# Patient Record
Sex: Female | Born: 1945
Health system: Southern US, Community
[De-identification: ages and names within clinical notes are randomized; demographics above are authoritative.]

## PROBLEM LIST (undated history)

## (undated) DIAGNOSIS — C801 Malignant (primary) neoplasm, unspecified: Secondary | ICD-10-CM

## (undated) DIAGNOSIS — I10 Essential (primary) hypertension: Secondary | ICD-10-CM

## (undated) DIAGNOSIS — R7303 Prediabetes: Secondary | ICD-10-CM

## (undated) DIAGNOSIS — M5136 Other intervertebral disc degeneration, lumbar region: Secondary | ICD-10-CM

## (undated) DIAGNOSIS — R011 Cardiac murmur, unspecified: Secondary | ICD-10-CM

## (undated) DIAGNOSIS — M1990 Unspecified osteoarthritis, unspecified site: Secondary | ICD-10-CM

## (undated) DIAGNOSIS — D649 Anemia, unspecified: Secondary | ICD-10-CM

## (undated) DIAGNOSIS — E785 Hyperlipidemia, unspecified: Secondary | ICD-10-CM

## (undated) DIAGNOSIS — M5126 Other intervertebral disc displacement, lumbar region: Secondary | ICD-10-CM

## (undated) DIAGNOSIS — M5137 Other intervertebral disc degeneration, lumbosacral region: Secondary | ICD-10-CM

## (undated) DIAGNOSIS — K5190 Ulcerative colitis, unspecified, without complications: Secondary | ICD-10-CM

## (undated) DIAGNOSIS — M51369 Other intervertebral disc degeneration, lumbar region without mention of lumbar back pain or lower extremity pain: Secondary | ICD-10-CM

## (undated) DIAGNOSIS — K219 Gastro-esophageal reflux disease without esophagitis: Secondary | ICD-10-CM

## (undated) HISTORY — PX: EYE SURGERY: SHX253

---

## 1997-07-09 ENCOUNTER — Other Ambulatory Visit: Admission: RE | Admit: 1997-07-09 | Discharge: 1997-07-09 | Payer: Self-pay | Admitting: Obstetrics and Gynecology

## 1998-01-31 ENCOUNTER — Ambulatory Visit (HOSPITAL_COMMUNITY): Admission: RE | Admit: 1998-01-31 | Discharge: 1998-01-31 | Payer: Self-pay | Admitting: Gastroenterology

## 1998-09-01 ENCOUNTER — Other Ambulatory Visit: Admission: RE | Admit: 1998-09-01 | Discharge: 1998-09-01 | Payer: Self-pay | Admitting: Obstetrics and Gynecology

## 1998-12-13 ENCOUNTER — Encounter: Payer: Self-pay | Admitting: Obstetrics and Gynecology

## 1998-12-13 ENCOUNTER — Encounter: Admission: RE | Admit: 1998-12-13 | Discharge: 1998-12-13 | Payer: Self-pay | Admitting: Obstetrics and Gynecology

## 1999-06-23 ENCOUNTER — Encounter (INDEPENDENT_AMBULATORY_CARE_PROVIDER_SITE_OTHER): Payer: Self-pay | Admitting: Specialist

## 1999-06-23 ENCOUNTER — Ambulatory Visit (HOSPITAL_COMMUNITY): Admission: RE | Admit: 1999-06-23 | Discharge: 1999-06-23 | Payer: Self-pay | Admitting: Gastroenterology

## 1999-11-06 ENCOUNTER — Other Ambulatory Visit: Admission: RE | Admit: 1999-11-06 | Discharge: 1999-11-06 | Payer: Self-pay | Admitting: Obstetrics and Gynecology

## 1999-12-07 ENCOUNTER — Other Ambulatory Visit: Admission: RE | Admit: 1999-12-07 | Discharge: 1999-12-07 | Payer: Self-pay | Admitting: Gastroenterology

## 1999-12-20 ENCOUNTER — Encounter: Admission: RE | Admit: 1999-12-20 | Discharge: 1999-12-20 | Payer: Self-pay | Admitting: Obstetrics and Gynecology

## 1999-12-20 ENCOUNTER — Encounter: Payer: Self-pay | Admitting: Obstetrics and Gynecology

## 1999-12-22 ENCOUNTER — Encounter (INDEPENDENT_AMBULATORY_CARE_PROVIDER_SITE_OTHER): Payer: Self-pay | Admitting: *Deleted

## 1999-12-22 ENCOUNTER — Ambulatory Visit (HOSPITAL_COMMUNITY): Admission: RE | Admit: 1999-12-22 | Discharge: 1999-12-22 | Payer: Self-pay | Admitting: Gastroenterology

## 2000-06-04 ENCOUNTER — Other Ambulatory Visit: Admission: RE | Admit: 2000-06-04 | Discharge: 2000-06-04 | Payer: Self-pay | Admitting: Gastroenterology

## 2000-12-03 ENCOUNTER — Other Ambulatory Visit: Admission: RE | Admit: 2000-12-03 | Discharge: 2000-12-03 | Payer: Self-pay | Admitting: Obstetrics and Gynecology

## 2000-12-24 ENCOUNTER — Encounter: Admission: RE | Admit: 2000-12-24 | Discharge: 2000-12-24 | Payer: Self-pay | Admitting: Obstetrics and Gynecology

## 2000-12-24 ENCOUNTER — Encounter: Payer: Self-pay | Admitting: Obstetrics and Gynecology

## 2001-12-26 ENCOUNTER — Encounter: Admission: RE | Admit: 2001-12-26 | Discharge: 2001-12-26 | Payer: Self-pay | Admitting: Obstetrics and Gynecology

## 2001-12-26 ENCOUNTER — Encounter: Payer: Self-pay | Admitting: Obstetrics and Gynecology

## 2002-08-25 ENCOUNTER — Ambulatory Visit (HOSPITAL_COMMUNITY): Admission: RE | Admit: 2002-08-25 | Discharge: 2002-08-25 | Payer: Self-pay | Admitting: Gastroenterology

## 2002-08-25 ENCOUNTER — Encounter (INDEPENDENT_AMBULATORY_CARE_PROVIDER_SITE_OTHER): Payer: Self-pay | Admitting: *Deleted

## 2002-11-27 ENCOUNTER — Encounter: Admission: RE | Admit: 2002-11-27 | Discharge: 2002-11-27 | Payer: Self-pay | Admitting: Gastroenterology

## 2002-12-28 ENCOUNTER — Encounter: Admission: RE | Admit: 2002-12-28 | Discharge: 2002-12-28 | Payer: Self-pay | Admitting: Obstetrics and Gynecology

## 2003-08-02 ENCOUNTER — Ambulatory Visit (HOSPITAL_COMMUNITY): Admission: RE | Admit: 2003-08-02 | Discharge: 2003-08-02 | Payer: Self-pay | Admitting: Gastroenterology

## 2003-08-02 ENCOUNTER — Encounter (INDEPENDENT_AMBULATORY_CARE_PROVIDER_SITE_OTHER): Payer: Self-pay | Admitting: *Deleted

## 2003-12-30 ENCOUNTER — Encounter: Admission: RE | Admit: 2003-12-30 | Discharge: 2003-12-30 | Payer: Self-pay | Admitting: Obstetrics and Gynecology

## 2005-01-16 ENCOUNTER — Encounter: Admission: RE | Admit: 2005-01-16 | Discharge: 2005-01-16 | Payer: Self-pay | Admitting: Obstetrics and Gynecology

## 2006-01-17 ENCOUNTER — Encounter: Admission: RE | Admit: 2006-01-17 | Discharge: 2006-01-17 | Payer: Self-pay | Admitting: Obstetrics and Gynecology

## 2007-01-20 ENCOUNTER — Encounter: Admission: RE | Admit: 2007-01-20 | Discharge: 2007-01-20 | Payer: Self-pay | Admitting: Obstetrics and Gynecology

## 2007-07-22 ENCOUNTER — Encounter (INDEPENDENT_AMBULATORY_CARE_PROVIDER_SITE_OTHER): Payer: Self-pay | Admitting: Gastroenterology

## 2007-07-22 ENCOUNTER — Ambulatory Visit (HOSPITAL_COMMUNITY): Admission: RE | Admit: 2007-07-22 | Discharge: 2007-07-22 | Payer: Self-pay | Admitting: Gastroenterology

## 2008-01-21 ENCOUNTER — Encounter: Admission: RE | Admit: 2008-01-21 | Discharge: 2008-01-21 | Payer: Self-pay | Admitting: Nurse Practitioner

## 2008-04-23 ENCOUNTER — Encounter: Admission: RE | Admit: 2008-04-23 | Discharge: 2008-04-23 | Payer: Self-pay | Admitting: Internal Medicine

## 2008-12-20 ENCOUNTER — Ambulatory Visit (HOSPITAL_COMMUNITY): Admission: RE | Admit: 2008-12-20 | Discharge: 2008-12-20 | Payer: Self-pay | Admitting: Gastroenterology

## 2008-12-23 ENCOUNTER — Encounter: Admission: RE | Admit: 2008-12-23 | Discharge: 2008-12-23 | Payer: Self-pay | Admitting: Gastroenterology

## 2009-01-21 ENCOUNTER — Encounter: Admission: RE | Admit: 2009-01-21 | Discharge: 2009-01-21 | Payer: Self-pay | Admitting: Internal Medicine

## 2009-06-22 ENCOUNTER — Encounter: Admission: RE | Admit: 2009-06-22 | Discharge: 2009-06-22 | Payer: Self-pay | Admitting: Internal Medicine

## 2009-09-26 ENCOUNTER — Emergency Department (HOSPITAL_COMMUNITY): Admission: EM | Admit: 2009-09-26 | Discharge: 2009-09-26 | Payer: Self-pay | Admitting: Emergency Medicine

## 2009-10-07 ENCOUNTER — Encounter: Admission: RE | Admit: 2009-10-07 | Discharge: 2009-10-07 | Payer: Self-pay | Admitting: Internal Medicine

## 2010-01-27 ENCOUNTER — Other Ambulatory Visit: Payer: Self-pay | Admitting: Gastroenterology

## 2010-02-13 ENCOUNTER — Other Ambulatory Visit: Payer: Self-pay | Admitting: Internal Medicine

## 2010-02-13 DIAGNOSIS — Z1231 Encounter for screening mammogram for malignant neoplasm of breast: Secondary | ICD-10-CM

## 2010-02-21 ENCOUNTER — Ambulatory Visit
Admission: RE | Admit: 2010-02-21 | Discharge: 2010-02-21 | Disposition: A | Discharge status: Home / Self Care | Payer: Self-pay | Source: Ambulatory Visit | Attending: Internal Medicine | Admitting: Internal Medicine

## 2010-02-21 DIAGNOSIS — Z1231 Encounter for screening mammogram for malignant neoplasm of breast: Secondary | ICD-10-CM

## 2010-03-16 LAB — URINALYSIS, ROUTINE W REFLEX MICROSCOPIC
Bilirubin Urine: NEGATIVE
Glucose, UA: NEGATIVE mg/dL
Ketones, ur: NEGATIVE mg/dL
Nitrite: NEGATIVE
Protein, ur: 30 mg/dL — AB
Specific Gravity, Urine: 1.011 (ref 1.005–1.030)
Urobilinogen, UA: 0.2 mg/dL (ref 0.0–1.0)
pH: 7.5 (ref 5.0–8.0)

## 2010-03-16 LAB — URINE MICROSCOPIC-ADD ON

## 2010-05-16 NOTE — Op Note (Signed)
Donna [name], Donna [name]               ACCOUNT NO.:  [id number]   MEDICAL RECORD NO.:  [id number]          PATIENT TYPE:  AMB   LOCATION:  ENDO                         FACILITY:  [facility]   PHYSICIAN:  Ronald Lobo, M.D.   DATE OF BIRTH:  1945/01/29   DATE OF PROCEDURE:  07/22/2007  DATE OF DISCHARGE:                               OPERATIVE REPORT   PROCEDURE:  Upper endoscopy with Savary dilatation of the esophagus  under fluoroscopy.   INDICATIONS:  A 64 year old female with dysphagia symptoms and known  history of esophageal ring seen on previous endoscopy, but never  previously dilated.   FINDINGS:  Dilatation to 18 mm performed.   DESCRIPTION OF PROCEDURE:  The risks of the procedure had been reviewed  with the patient who provided written consent.  Sedation was fentanyl  100 mcg and Versed 10 mg IV.  The Pentax video endoscope was passed  under direct vision, entering the esophagus with slight difficulty  through a somewhat hypertonic upper esophageal sphincter.   The esophagus was endoscopically normal, specifically without evidence  of reflux esophagitis, Barrett's esophagus, varices, infection or  neoplasia.  A small 2 cm hiatal hernia was present and an intermittently-  visible mild esophageal ring was present, which offered no resistance to  passage of the scope into the stomach.  The stomach contained no  significant residual and had normal mucosa, including a retroflexed view  of the cardia which showed the hiatal hernia from its inferior  perspective, with a minimally patulous diaphragmatic hiatus.  No  gastritis, erosions, ulcers, polyps or masses were seen in the stomach.   The pylorus was normal.  The distal duodenal bulb, at its junction with  the second duodenum, had some focal erosions, but no ulcers were seen  and the second duodenum looked normal.   Savary dilatation was then performed in the standard fashion.  The  spring tipped guidewire was passed through  the scope into stomach.  The  scope was removed in an exchange fashion.  The wire was confirmed to be  in appropriate position fluoroscopically, and a single passage was made  with an 18 mm Savary dilator, meeting some resistance in the  cricopharyngeal region and then passing easily such that the collar was  noted to have passed below the level of the diaphragm.  The dilator and  the guidewire were removed, and the patient was free endoscoped under  direct vision which showed no evident fracture of the ring, nor any  undue trauma to the cricopharyngeal region, esophagus or stomach.   The scope was then removed from the patient who tolerated the procedure  well and without apparent complication.   IMPRESSION:  1. Dysphagia symptoms, presumably secondary to esophageal ring,      dilated as described above to 18 mm.  2. Small hiatal hernia.  3. Mild erosive duodenitis.   PLAN:  Clinical follow-up of dysphagia symptoms.           ______________________________  Ronald Lobo, M.D.     RB/MEDQ  D:  07/22/2007  T:  07/22/2007  Job:  [id number]

## 2010-05-16 NOTE — Op Note (Signed)
NAMEFREDDI, FORSTER               ACCOUNT NO.:  [id number]   MEDICAL RECORD NO.:  [id number]          PATIENT TYPE:  AMB   LOCATION:  ENDO                         FACILITY:  [facility]   PHYSICIAN:  Ronald Lobo, M.D.   DATE OF BIRTH:  1945-08-06   DATE OF PROCEDURE:  07/22/2007  DATE OF DISCHARGE:                               OPERATIVE REPORT   PROCEDURE:  Colonoscopy with biopsy.   INDICATIONS:  A 65 year old female with longstanding ulcerative colitis,  for dysplasia surveillance.   FINDINGS:  Mild disease activity throughout the colon.   DESCRIPTION OF PROCEDURE:  The nature, purpose and risks of the  procedure were familiar to the patient from prior examinations and she  provided written consent.  Sedation was fentanyl 125 mcg and Versed 12.5  mg IV prior to and during this procedure and the upper endoscopy with  esophageal dilatation that preceded it.   There was no problem with clinical instability during the procedure.   The Pentax adult video colonoscope was advanced to the terminal ileum  without significant difficulty.  The terminal ileum had a normal  appearance.  Pullback was then performed.  The quality of the prep was  quite good, and it is felt that all areas were adequately seen.   Throughout the colon there was erythema and, especially more proximally,  a moderate amount of granularity and a little bit of exudate.  No  friability, hemorrhage, ulcerations, polyps or masses were seen, and I  did not see any diverticulosis or vascular ectasia.   Random biopsies were obtained along the length of the colon.   The patient tolerated the procedure well and there no apparent  complications.  Retroflexion was not performed in the rectum.   IMPRESSION:  Mild to moderate ulcerative colitis activity throughout the  colon.   PLAN:  Await pathology results.           ______________________________  Ronald Lobo, M.D.     RB/MEDQ  D:  07/22/2007  T:  07/22/2007   Job:  [id number]

## 2010-05-19 NOTE — Op Note (Signed)
   NAME:  Donna [name], Donna [name]                         ACCOUNT NO.:  [id number]   MEDICAL RECORD NO.:  [id number]                   PATIENT TYPE:  AMB   LOCATION:  ENDO                                 FACILITY:  [place]   PHYSICIAN:  Ronald Lobo, M.D.                DATE OF BIRTH:  [birth year]   DATE OF PROCEDURE:  08/25/2002  DATE OF DISCHARGE:                                 OPERATIVE REPORT   PROCEDURE PERFORMED:  Upper endoscopy.   ENDOSCOPIST:  Cleotis Nipper, M.D.   INDICATIONS FOR PROCEDURE:  Reflux symptoms fairly well controlled by proton  pump inhibitor therapy.   FINDINGS:  Small hiatal hernia with esophageal ring.  No adverse sequelae of  gastroesophageal reflux disease endoscopically evident.   DESCRIPTION OF PROCEDURE:  The patient provided written consent.  Sedation  was fentanyl 100 mcg and Versed 8 mg IV without arrhythmias or desaturation.  The Olympus video endoscope was passed under direct vision.  The vocal cords  and larynx looked grossly normal, perhaps a little bit of thickening of the  posterior commissure.   The esophagus was readily entered and normal in its entirety without  evidence of reflux esophagitis, Barrett's esophagus, varices, infection or  neoplasia or free reflux.  There was a small hiatal hernia within the  esophageal ring.  The ring was widely patent.  The stomach contained no  significant residual had normal mucosa without evidence of gastritis,  erosions, ulcers, polyps or masses.  The pylorus, duodenal bulb and second  duodenum looked normal.  Retroflex viewing was unremarkable by my  recollection.  No biopsies were obtained.  The patient tolerated the  procedure well and there were no apparent complications.   IMPRESSION:  Essentially normal endoscopy.  Small hiatal hernia with  esophageal ring but no adverse sequelae of reflux identified.   PLAN:  Continue proton pump inhibitor therapy as needed.  Proceed to  colonoscopic  evaluation.                                                Ronald Lobo, M.D.    RB/MEDQ  D:  08/25/2002  T:  08/25/2002  Job:  [id number]   cc:   [place]

## 2010-05-19 NOTE — Op Note (Signed)
[place]  Patient:    Donna [name], Donna [name]                      MRN: [id number] Proc. Date: 12/22/99 Adm. Date:  [id number] Disc. Date: [id number] Attending:  Ernie Avena                           Operative Report  PROCEDURE:  Colonoscopy with polypectomy and biopsies.  ENDOSCOPIST:  Cleotis Nipper, M.D.  INDICATIONS:  A 65 year old female with longstanding ulcerative colitis for dysplasia surveillance, and removal of residual rectal polyp tissue identified on recent flexible sigmoidoscopy and noted to be adenomatous in character.  FINDINGS:  Left-sided colitis, sessile 2 cm diameter flap verrucous polyp about 3 cm inside the anal verge.  INFORMED CONSENT:  The nature, purpose, and risks of the procedure are familiar to the patient from multiple previous exams and she provided written consent.  SEDATION:  Fentanyl 100 mcg and Versed 10 mg IV without arrhythmias or desaturation.  DESCRIPTION OF PROCEDURE:  The Olympus Adult video colonoscope was easily advanced to the cecum and for a short distance into a normal appearing terminal ileum after which pullback was initiated.  The quality of the prep was excellent and it was felt that all areas were well seen.  There was left-sided confluent colitis characterized by "sandpaper" erythema, and slight granularity up to about 60 cm, felt to probable correspond to the distal transverse colon.  Proximal to that, the mucosa looked quite normal except for some tiny foci of erythema here and there, but the vascular pattern was fairly well preserved.  In the rectum, a few centimeters inside the anal verge was a 2 cm diameter very flat sessile verrucous polyp as noted previously and for which previous attempts at removal with snare polypectomy had been made.  However, the polyp had not been completely excised as evidenced by recent sigmoidoscopic evaluation.  The lesion was raised by  injecting normal saline submucosaly underneath the lesion to elevate it, and then it was snared off piecemeal, dragging the biopsy forceps cross lesion while cauterizing to help desiccate and devitalize the tissue.  Multiple tiny fragments of tissue were obtained for histologic analysis, of this lesion, and when I was done, it appeared that all tissue had either been excised or devitalized.  Random mucosal biopsies were obtained from the uninflamed and inflamed segments of the colon as well to look for dysplasia.  The patient tolerated the procedure well and there were no apparent complications.  No other polyps other than the one mentioned above were identified, nor there was any evidence of vascular malformations or diverticulosis.  PLAN:  Await pathology.  Probable flexible sigmoidoscopy in 6-12 months and follow up colonoscopy in 1-2 years, depending on the histologic findings. DD:  12/22/99 TD:  12/24/99 Job: [id number]

## 2010-05-19 NOTE — Op Note (Signed)
NAME:  Donna [name], Donna [name]                         ACCOUNT NO.:  [id number]   MEDICAL RECORD NO.:  [id number]                   PATIENT TYPE:  AMB   LOCATION:  ENDO                                 FACILITY:  [place]   PHYSICIAN:  Ronald Lobo, M.D.                DATE OF BIRTH:  [birth year]   DATE OF PROCEDURE:  08/25/2002  DATE OF DISCHARGE:                                 OPERATIVE REPORT   PROCEDURE PERFORMED:  Colonoscopy with biopsies.   ENDOSCOPIST:  Cleotis Nipper, M.D.   INDICATIONS FOR PROCEDURE:  The patient is a 65 year old female with  longstanding ulcerative colitis, for dysplasia surveillance.  Her most  recent colonoscopy was about three years ago and there was a small rectal  adenoma at that time without any evident residual on sigmoidoscopic re-  evaluation about two years ago.   FINDINGS:  Mild to moderate left-sided colitis activity.  Poor prep in the  proximal colon.  Multiple biopsies obtained for dysplasia surveillance.   DESCRIPTION OF PROCEDURE:  The nature, purpose and risks of the procedure  were familiar to the patient from prior examinations and she provided  written consent.  Sedation for this procedure and the upper endoscopy which  preceded it totaled fentanyl 100 mcg and Versed 10 mg IV without arrhythmias  or desaturation.   The Olympus adjustable tension pediatric video colonoscope was advanced  around to what I believe was the cecum, although adherent stool which would  not rinse off prevented discrete visualization of the appendiceal orifice or  the opening of the ileocecal valve.  However, no further lumen was seen and  there was fairly typical cecal type appearance and what I believe was the V-  shaped notch in the ileocecal valve, so I am fairly confident of the  location.  Pullback was then performed.   In the left colon, and relatively speaking, sparing the rectum, there was  mild to moderate activity characterized by sandpaper erythema  and some  shallow, somewhat serpiginous ulcerations, no deep ulcerations, no furrows,  no pseudopolyps.  The patient did not really have granularity or classic  exudate of ulcerative colitis.   No polyps or masses were seen during this exam.  I did not appreciate any  vascular ectasia.  In the ascending colon, especially near the cecum, there  was quite a bit of a thick coating of adherent stool which would not wash  off.  Thus, small lesions or isolated mucosal abnormalities may have been  missed.  Multiple biopsies were obtained at during the course of this exam  during pullback, randomly, for dysplasia surveillance.  The first jar was  used for the ascending and transverse colon biopsies, the second for the  left colon and sigmoid colon, and the third for the rectum.  It is estimated  that approximately 30 to 40 biopsies were obtained in all.   Retroflexion was not performed  in the rectum.  Reinspection of the  rectosigmoid was unremarkable.  The patient tolerated the procedure well and  there were no apparent complications.   IMPRESSION:  1. Mild to moderate left-sided colitis activity.  2. Poor prep compromising quality of the exam in the right colon.  3. Dysplasia surveillance biopsies obtained.   PLAN:  Await pathology results. Consider colonoscopic follow-up in one year  because of the long duration of colitis and the suboptimal prep on today's  exam.                                                Ronald Lobo, M.D.    RB/MEDQ  D:  08/25/2002  T:  08/25/2002  Job:  [id number]   cc:   [place]

## 2010-05-19 NOTE — Procedures (Signed)
[place]  Patient:    Donna [name], Donna [name]                      MRN: [id number] Proc. Date: 06/23/99 Adm. Date:  [id number] Disc. Date: [id number] Attending:  Ernie Avena                           Procedure Report  PROCEDURE:  Colonoscopy with polypectomy and biopsies.  ENDOSCOPIST:  Cleotis Nipper, M.D.  INDICATIONS: A 65 year old with longstanding ulcerative colitis, for dysplasia surveillance.  Last colonoscopy about 1-1/2 years ago showed no evidence of dysplasia.  FINDINGS:  Medium sized sessile distal rectal polyp snared off.  Mild smoldering disease activity especially in the left colon.  INFORMED CONSENT:  The nature, purpose, and risks of the procedure were familiar to the patient from prior examination.  She provided written consent.  SEDATION:  Droperidol 5 mg, Fentanyl 90 mg, and versed 7 mg IV without arrhythmias or desaturation.  DESCRIPTION OF PROCEDURE:  The Olympus Adult video colonoscope was advanced around the colon to the cecum, turning the patient in the supine position applying some external abdominal compression to reach the base of the cecum. Pullback was then performed.  The quality of the prep was very good and it is felt that all areas were well seen.  There was definitive active colitis, somewhat smoldering in character, characterized by erythema and telangiectatic changes, in the left colon and in particular the rectum.  Also exudate and mild granularity.  This looked somewhat like burned out or mildly smoldering chronic colitis rather than acute active colitis.  These changes were less pronounced, but still present to some degree, in the proximal colon.  In the distal rectum at approximately 4 cm from the dentate line, there was a sessile, benign appearing 12 x 25 mm polyp which was snared off, leaving a nice clean crater without evidence of residual polyp tissue.  (A little bit of residual  tissue to the left of the main polyp was snared off separately). There was some slight transient oozing which clotted off spontaneously within a minute or so.  There was no evidence of excessive cautery.  The main polyp fragment was retrieved by withdrawal along with the scope all through the anal canal although some small fragments were also suctioned through the scope.  No other polyps were observed during this exam and there was no evidence of cancer, vascular malformations or diverticulosis.  Retroflexion was not performed in the rectum.  The patient did have a somewhat tight anal canal making insertion of the adult colonoscope somewhat difficult but it did not feel like the classic analgesic stenosis.  Random mucosal biopsies, approximately 20 or 30 in all were obtained along the length of the colon for dysplasia surveillance.  The patient tolerated the procedure well, and there were no apparent complications.  IMPRESSION: 1. Chronic smoldering colitis with mild activity at present, involving the    entire colon but especially the distal colon. 2. Rectal polyp, snared.  PLAN:  Await pathology.  If the rectal polyp is adenomatous in character, it will be a difficult decision whether to treat this as a plain adenoma or as "dysplasia associated with a mass" in which case a total colectomy would have to be considered. DD:  06/23/99 TD:  06/26/99 Job: [id number]

## 2010-05-19 NOTE — Op Note (Signed)
NAME:  Donna [name], Donna [name]                         ACCOUNT NO.:  [id number]   MEDICAL RECORD NO.:  [id number]                   PATIENT TYPE:  AMB   LOCATION:  ENDO                                 FACILITY:  [place]   PHYSICIAN:  Ronald Lobo, M.D.                DATE OF BIRTH:  15-Nov-1945   DATE OF PROCEDURE:  08/02/2003  DATE OF DISCHARGE:                                 OPERATIVE REPORT   PROCEDURE:  Colonoscopy with biopsies.   INDICATIONS:  This is a 65 year old female with a roughly 38-year history of  ulcerative colitis, for dysplasia surveillance.   FINDINGS:  Mild left-sided activity, minimal proximal colitis activity.   PROCEDURE:  The nature, purpose, and risks of the procedure were familiar to  the patient from prior examination, and she provided written consent.  Sedation was fentanyl 100 mcg and Versed 10 mg IV with transient  desaturation down into the mid-80s but the patient was arousable and the O2  saturation responded to stimulation, head positioning, and turning her O2  flow up for a period of time.  There were a couple of brief periods of  desaturation into the 80s like this over the last several minutes, but  thereafter the O2 saturations remained well in the 90s.   The Olympus adjustable-tension pediatric video colonoscope was easily  advanced to the cecum, as identified by visualization of the appendiceal  orifice and the ileocecal valve.  We did use some external abdominal  compression to help control looping.  Pullback was then performed.   In contrast to last year's exam, this time the prep was excellent and there  was no adherent stool, so it is felt that all areas were well-seen.   The proximal colon was fairly normal with just a little bit of patchy  erythema and maybe some small erosions here and there, whereas the left  colon had a few linear shallow ulcerations and, especially distally,  granular mucosa with loss of vascularity.  Overall, I would rate  the colitis  severity roughly 3 on a scale of 10.   Random mucosal biopsies were obtained along the length of the colon.   It is estimated approximately 30 biopsies, perhaps 40, were obtained.   Retroflexion was not performed in the rectum.   The patient tolerated the procedure well, and there were no apparent  complications.   IMPRESSION:  Mild ulcerative colitis activity, with multiple biopsies taken  for dysplasia surveillance (556.0).   PLAN:  Await pathology results.                                               Ronald Lobo, M.D.    RB/MEDQ  D:  08/02/2003  T:  08/02/2003  Job:  [id number]   cc:   [place]

## 2010-07-17 ENCOUNTER — Emergency Department (HOSPITAL_COMMUNITY): Payer: BC Managed Care – PPO

## 2010-07-17 ENCOUNTER — Inpatient Hospital Stay (HOSPITAL_COMMUNITY)
Admission: EM | Admit: 2010-07-17 | Discharge: 2010-07-20 | DRG: 179 | Disposition: A | Discharge status: Home / Self Care | Payer: BC Managed Care – PPO | Attending: Internal Medicine | Admitting: Internal Medicine

## 2010-07-17 DIAGNOSIS — K219 Gastro-esophageal reflux disease without esophagitis: Secondary | ICD-10-CM | POA: Diagnosis present

## 2010-07-17 DIAGNOSIS — D3500 Benign neoplasm of unspecified adrenal gland: Secondary | ICD-10-CM | POA: Diagnosis present

## 2010-07-17 DIAGNOSIS — E860 Dehydration: Secondary | ICD-10-CM | POA: Diagnosis present

## 2010-07-17 DIAGNOSIS — K625 Hemorrhage of anus and rectum: Secondary | ICD-10-CM | POA: Diagnosis present

## 2010-07-17 DIAGNOSIS — Z7982 Long term (current) use of aspirin: Secondary | ICD-10-CM

## 2010-07-17 DIAGNOSIS — K5190 Ulcerative colitis, unspecified, without complications: Principal | ICD-10-CM | POA: Diagnosis present

## 2010-07-17 LAB — DIFFERENTIAL
Basophils Absolute: 0.1 10*3/uL (ref 0.0–0.1)
Basophils Relative: 1 % (ref 0–1)
Eosinophils Absolute: 0.1 10*3/uL (ref 0.0–0.7)
Eosinophils Relative: 1 % (ref 0–5)
Lymphocytes Relative: 24 % (ref 12–46)
Lymphs Abs: 2 10*3/uL (ref 0.7–4.0)
Monocytes Absolute: 0.5 10*3/uL (ref 0.1–1.0)
Monocytes Relative: 7 % (ref 3–12)
Neutro Abs: 5.6 10*3/uL (ref 1.7–7.7)
Neutrophils Relative %: 67 % (ref 43–77)

## 2010-07-17 LAB — URINALYSIS, ROUTINE W REFLEX MICROSCOPIC
Bilirubin Urine: NEGATIVE
Glucose, UA: NEGATIVE mg/dL
Ketones, ur: NEGATIVE mg/dL
Nitrite: NEGATIVE
Protein, ur: NEGATIVE mg/dL
Specific Gravity, Urine: 1.016 (ref 1.005–1.030)
Urobilinogen, UA: 0.2 mg/dL (ref 0.0–1.0)
pH: 6.5 (ref 5.0–8.0)

## 2010-07-17 LAB — URINE MICROSCOPIC-ADD ON

## 2010-07-17 LAB — CBC
HCT: 36 % (ref 36.0–46.0)
Hemoglobin: 12.5 g/dL (ref 12.0–15.0)
MCH: 29.7 pg (ref 26.0–34.0)
MCHC: 34.7 g/dL (ref 30.0–36.0)
MCV: 85.5 fL (ref 78.0–100.0)
Platelets: 278 10*3/uL (ref 150–400)
RBC: 4.21 MIL/uL (ref 3.87–5.11)
RDW: 15.4 % (ref 11.5–15.5)
WBC: 8.3 10*3/uL (ref 4.0–10.5)

## 2010-07-17 LAB — SAMPLE TO BLOOD BANK

## 2010-07-17 LAB — APTT: aPTT: 24 seconds (ref 24–37)

## 2010-07-17 LAB — PROTIME-INR
INR: 0.91 (ref 0.00–1.49)
Prothrombin Time: 12.4 seconds (ref 11.6–15.2)

## 2010-07-17 LAB — BASIC METABOLIC PANEL
BUN: 19 mg/dL (ref 6–23)
CO2: 28 mEq/L (ref 19–32)
Calcium: 10 mg/dL (ref 8.4–10.5)
Chloride: 102 mEq/L (ref 96–112)
Creatinine, Ser: 1.37 mg/dL — ABNORMAL HIGH (ref 0.50–1.10)
GFR calc Af Amer: 47 mL/min — ABNORMAL LOW (ref >60)
GFR calc non Af Amer: 39 mL/min — ABNORMAL LOW (ref >60)
Glucose, Bld: 137 mg/dL — ABNORMAL HIGH (ref 70–99)
Potassium: 3.4 mEq/L — ABNORMAL LOW (ref 3.5–5.1)
Sodium: 142 mEq/L (ref 135–145)

## 2010-07-18 LAB — DIFFERENTIAL
Basophils Absolute: 0.1 10*3/uL (ref 0.0–0.1)
Basophils Relative: 1 % (ref 0–1)
Eosinophils Absolute: 0.2 10*3/uL (ref 0.0–0.7)
Eosinophils Relative: 3 % (ref 0–5)
Lymphocytes Relative: 31 % (ref 12–46)
Lymphs Abs: 2.2 10*3/uL (ref 0.7–4.0)
Monocytes Absolute: 0.6 10*3/uL (ref 0.1–1.0)
Monocytes Relative: 9 % (ref 3–12)
Neutro Abs: 4 10*3/uL (ref 1.7–7.7)
Neutrophils Relative %: 57 % (ref 43–77)

## 2010-07-18 LAB — MAGNESIUM: Magnesium: 2.1 mg/dL (ref 1.5–2.5)

## 2010-07-18 LAB — BASIC METABOLIC PANEL
BUN: 12 mg/dL (ref 6–23)
CO2: 25 mEq/L (ref 19–32)
Calcium: 9.6 mg/dL (ref 8.4–10.5)
Chloride: 105 mEq/L (ref 96–112)
Creatinine, Ser: 0.96 mg/dL (ref 0.50–1.10)
GFR calc Af Amer: >60 mL/min (ref >60)
GFR calc non Af Amer: 59 mL/min — ABNORMAL LOW (ref >60)
Glucose, Bld: 125 mg/dL — ABNORMAL HIGH (ref 70–99)
Potassium: 3.8 mEq/L (ref 3.5–5.1)
Sodium: 143 mEq/L (ref 135–145)

## 2010-07-18 LAB — ABO/RH: ABO/RH(D): A POS

## 2010-07-18 LAB — CBC
HCT: 33.3 % — ABNORMAL LOW (ref 36.0–46.0)
Hemoglobin: 11.6 g/dL — ABNORMAL LOW (ref 12.0–15.0)
MCH: 29.7 pg (ref 26.0–34.0)
MCHC: 34.8 g/dL (ref 30.0–36.0)
MCV: 85.2 fL (ref 78.0–100.0)
Platelets: 285 10*3/uL (ref 150–400)
RBC: 3.91 MIL/uL (ref 3.87–5.11)
RDW: 15.5 % (ref 11.5–15.5)
WBC: 7.1 10*3/uL (ref 4.0–10.5)

## 2010-07-18 LAB — HEMOGLOBIN AND HEMATOCRIT, BLOOD
HCT: 30.5 % — ABNORMAL LOW (ref 36.0–46.0)
Hemoglobin: 10.7 g/dL — ABNORMAL LOW (ref 12.0–15.0)

## 2010-07-18 LAB — OCCULT BLOOD, POC DEVICE: Fecal Occult Bld: POSITIVE

## 2010-07-18 MED ORDER — IOHEXOL 300 MG/ML  SOLN
100.0000 mL | Freq: Once | INTRAMUSCULAR | Status: AC | PRN
  Administered 2010-07-18: 100 mL via INTRAVENOUS

## 2010-07-19 LAB — CBC
HCT: 30.2 % — ABNORMAL LOW (ref 36.0–46.0)
Hemoglobin: 10.4 g/dL — ABNORMAL LOW (ref 12.0–15.0)
MCH: 29.4 pg (ref 26.0–34.0)
MCHC: 34.4 g/dL (ref 30.0–36.0)
MCV: 85.3 fL (ref 78.0–100.0)
Platelets: 274 10*3/uL (ref 150–400)
RBC: 3.54 MIL/uL — ABNORMAL LOW (ref 3.87–5.11)
RDW: 15.5 % (ref 11.5–15.5)
WBC: 6.6 10*3/uL (ref 4.0–10.5)

## 2010-07-19 LAB — HEMOGLOBIN AND HEMATOCRIT, BLOOD
HCT: 31.3 % — ABNORMAL LOW (ref 36.0–46.0)
HCT: 31.3 % — ABNORMAL LOW (ref 36.0–46.0)
Hemoglobin: 10.9 g/dL — ABNORMAL LOW (ref 12.0–15.0)
Hemoglobin: 10.9 g/dL — ABNORMAL LOW (ref 12.0–15.0)

## 2010-07-19 LAB — BASIC METABOLIC PANEL
BUN: 11 mg/dL (ref 6–23)
CO2: 25 mEq/L (ref 19–32)
Calcium: 9.5 mg/dL (ref 8.4–10.5)
Chloride: 106 mEq/L (ref 96–112)
Creatinine, Ser: 0.83 mg/dL (ref 0.50–1.10)
GFR calc Af Amer: >60 mL/min (ref >60)
GFR calc non Af Amer: >60 mL/min (ref >60)
Glucose, Bld: 153 mg/dL — ABNORMAL HIGH (ref 70–99)
Potassium: 3.7 mEq/L (ref 3.5–5.1)
Sodium: 142 mEq/L (ref 135–145)

## 2010-07-20 LAB — CROSSMATCH
ABO/RH(D): A POS
Antibody Screen: NEGATIVE
Unit division: 0
Unit division: 0

## 2010-07-20 LAB — HEMOGLOBIN AND HEMATOCRIT, BLOOD
HCT: 28.4 % — ABNORMAL LOW (ref 36.0–46.0)
HCT: 29.2 % — ABNORMAL LOW (ref 36.0–46.0)
Hemoglobin: 9.9 g/dL — ABNORMAL LOW (ref 12.0–15.0)
Hemoglobin: 10.1 g/dL — ABNORMAL LOW (ref 12.0–15.0)

## 2010-07-20 NOTE — H&P (Signed)
[name], [name] NO.:  [id number]  MEDICAL RECORD NO.:  [id number]  LOCATION:  6861                         FACILITY:  [place]  PHYSICIAN:  Thornton Dales, MD   DATE OF BIRTH:  04/10/1945  DATE OF ADMISSION:  07/17/2010 DATE OF DISCHARGE:                             HISTORY & PHYSICAL   GASTROENTEROLOGIST:  Ronald Lobo, MD  PRIMARY CARE PHYSICIAN:  Unassigned.  CHIEF COMPLAINT:  Abdominal pain and rectal bleeding.  HISTORY OF PRESENT ILLNESS:  This is a 65 year old pleasant African American woman who presented with bloody diarrhea for the past 2 days, which initially was blood mixed with stool, but then became frankly bloody in the past several hours.  The patient has not had any more rectal bleeding since she got to the emergency room.  She does complain of abdominal pain more localized in both lower quadrants radiating towards the back region.  She denied any vomiting, but she has had some nausea.  She has a history of ulcerative colitis and gets yearly colonoscopy by Dr. Cristina Gong.  Her last colonoscopy was in July of this year.  She takes medicine regularly for ulcerative colitis.  In the emergency room, she had a CAT scan of the abdomen, which confirmed inflammatory changes in the descending colon, sigmoid colon, and rectum, which seems the possibility of colitis.  The patient denies any chest pain, shortness of breath, fever, cough, back pain, or urinary symptoms.  PAST MEDICAL HISTORY: 1. Inflammatory bowel disease. 2. High blood pressure. 3. Gastroesophageal reflux disease. 4. History of posterior arthritis involving the knees. 5. History of dysphagia secondary to esophageal ring. 6. History of hiatal hernia.  MEDICATIONS: 1. Coryza 750 mg two capsules t.i.d. 2. Calcium citrate OTC one tablet daily. 3. Aspirin 81 mg daily. 4. Vitamin B12 1000 mcg daily. 5. Multivitamin one tablet daily. 6. Calcium 200 mg daily. 7. Hydrocodone/APAP  5/325 one tablet q.6. p.r.n. 8. Nabumetone 500 mg daily. 9. Protonix 40 mg daily. 10.Fish oil 1 g daily. 11.Lisinopril/HCT 20/3.5 one tablet daily.  ALLERGIES:  STATINS.  SOCIAL HISTORY:  No smoking, alcohol, or drugs.  She lives with son.  FAMILY HISTORY:  Negative for history of colon cancer.  Ten-point review of systems negative except as described above.  PHYSICAL EXAMINATION:  VITAL SIGNS:  Blood pressure 141/65, pulse 69, respirations 19, temperature 98.3, O2 sat 95% on room air. GENERAL:  Lying in bed, appear comfortably in no distress, awake, alert, and oriented. HEENT:  PERRLA.  Extraocular muscles are intact.  Mouth is moist. NECK:  Supple.  No JVD, adenopathy, or thyromegaly. LUNGS:  Clear breath sounds to auscultation bilaterally.  No added sounds. HEART:  S1-S2, regular rate and rhythm.  No murmurs, rubs, or gallops. ABDOMEN:  Full.  There is tenderness in the right lower quadrant.  Bowel sounds present.  No masses. EXTREMITIES:  No edema, clubbing, or cyanosis. NEUROLOGIC:  No focal deficit identified.  Speech is clear. SKIN:  No rash or lesion. LYMPHATICS:  No lingular swelling. PSYCHIATRIC:  Normal mood and affect.  LABORATORY:  White count normal at 8.3, hemoglobin 12.5, platelet count 78.  Coagulation profile is grossly normal.  Urinalysis is questionable  for urinary tract infection, leukocyte is moderate, wbc's 3-6, bacteria rare.  Chemistries; sodium is 142, potassium 3.4, BUN 19, creatinine 1.37.  CT of the abdomen and pelvis as described.  There is also questionable right adrenal adenoma suspected.  ASSESSMENT:  A 66 year old woman admitted with 1. Rectal bleeding likely secondary to flare of known inflammatory     bowel disease. 2. Ulcerative colitis flare. 3. High blood pressure, not fully optimized for obesity.  PLAN:  Admit to medical floor.  The patient will be on clear liquids. Watch hemoglobin and hematocrit serially.  Transfuse if needed.  I  will start her on IV steroids and start antibiotics.  Sent urine culture. Seem also have urinary tract infection.  GI team has been notified by the emergency room staff.  The patient may benefit from another colonoscopy of this admission.  Condition is overall stable.     Thornton Dales, MD     FA/MEDQ  D:  07/18/2010  T:  07/18/2010  Job:  [id number]  Electronically Signed by Thornton Dales  on 07/20/2010 02:45:28 AM

## 2010-07-24 LAB — CULTURE, BLOOD (ROUTINE X 2)
Culture  Setup Time: 201207171505
Culture  Setup Time: 201207171505
Culture: NO GROWTH
Culture: NO GROWTH

## 2010-08-01 NOTE — Discharge Summary (Signed)
NAMELINDSI, BAYLISS               ACCOUNT NO.:  [id number]  MEDICAL RECORD NO.:  [id number]  LOCATION:  1478                         FACILITY:  [facility]  PHYSICIAN:  Eleonore Chiquito, MD         DATE OF BIRTH:  [birth year]  DATE OF ADMISSION:  07/17/2010 DATE OF DISCHARGE:  07/20/2010                              DISCHARGE SUMMARY   ADMISSION DIAGNOSES: 1. Rectal bleeding due to the flare of noninflammatory bowel disease. 2. Ulcerative colitis flare. 3. Hypertension.  DISCHARGE DIAGNOSES: 1. Ulcerative colitis flare-up, resolved at this time. 2. Rectal bleeding, resolved.  PROCEDURES:  Tests performed in the hospital stay include, CT of the abdomen and pelvis showed abnormal appearance of descending colon, sigmoid colon, and rectum with possible C diff colitis.  The colon proximal to lesion is slightly dilated with the exception of circumferential narrowing proximal transverse colon which may represent peristalsis although abnormality at this level not excluded.  Right adrenal adenoma suspected.  BRIEF HISTORY AND PHYSICAL:  This is a 66 year old female, who came to the hospital with abdominal pain, rectal bleeding, and diarrhea for past 2 days.  The patient had a CAT scan, which confirmed inflammatory changes, so the patient was admitted with a diagnosis of rectal bleeding with ulcerative colitis flare-up.  BRIEF HOSPITAL COURSE:  The patient was seen by GI and was started on prednisone taper along with mesalamine.  At this time, the flare-up has resolved and GI recommended to follow up with Dr. Cristina Gong as outpatient. The patient takes Colazal at home and she will continue with the Colazal for the ulcerative colitis.  Also I am going to consider in the p.o. prednisone taper.  The patient was found to have adrenal adenoma on the CAT scan of the abdomen and pelvis, which needs to be further evaluated as per primary care attending and may need Endocrinology evaluation  as outpatient.  MEDICATION ON DISCHARGE: 1. Taper prednisone 40 mg p.o. daily for 3 days, then prednisone 30 mg     p.o. daily for 3 days and prednisone 20 mg p.o. daily for 3 days     and prednisone 10 mg p.o. daily for three days then stop. 2. Aspirin enteric coated 81 mg p.o. daily. 3. Calcium 1200 mg p.o. daily. 4. Calcium acetate with vitamin D over-the-counter 1 tablet p.o.     daily. 5. Colazal 750 mg 3 capsules by mouth three times a day. 6. Fish oil 1000 mg 1 capsule daily. 7. Hydrocodone and acetaminophen 1 tablet p.o. every 6 hours as     needed. 8. Hydrochlorothiazide 20/12.5 one tablet p.o. daily. 9. Vitamin 1 tablet p.o. daily. 10.Nabumetone 500 mg 1 tablet p.o. daily. 11.Pantoprazole 40 mg p.o. daily. 12.Red yeast rice over-the-counter 1 capsule daily. 13.Vitamin B12 one tablet p.o. daily.  FOLLOWUP:  The patient will follow up with Dr. Delia Chimes and Dr. Cristina Gong as an outpatient.  Adrenal adenoma to be evaluated as outpatient as per primary care physician.     Eleonore Chiquito, MD     GL/MEDQ  D:  07/20/2010  T:  07/20/2010  Job:  [id number]  cc:   Delia Chimes, NP Ronald Lobo, M.D.  Electronically  Signed by Eleonore Chiquito  on 08/01/2010 11:42:16 AM

## 2010-08-22 NOTE — Consult Note (Signed)
  NAMESHONTE, BEUTLER               ACCOUNT NO.:  [id number]  MEDICAL RECORD NO.:  [id number]  LOCATION:  2641                         FACILITY:  [facility]  PHYSICIAN:  Lear Ng, MDDATE OF BIRTH:  [birth year]  DATE OF CONSULTATION:  07/18/2010 DATE OF DISCHARGE:                                CONSULTATION   REQUESTING PHYSICIAN:  Verlee Monte, MD  INDICATIONS: 1. Ulcerative colitis flare. 2. Abdominal pain. 3. Bloody diarrhea.  HISTORY OF PRESENT ILLNESS:  Ms. Avellino is a pleasant 65 year old black woman with a history of ulcerative pancolitis who last had a colonoscopy in January 2012, which showed moderately active chronic colitis on proximal colonic biopsies and mildly active chronic colitis on left colonic biopsies.  She has been treated with Colazal most recently 2.25 g 3 times a day and states that she has been taking that as recommended. She states that she was doing well until this past Sunday morning when she had acute onset of multiple episodes of bloody diarrhea and diffuse abdominal pain that radiated into her back.  CAT scan done in the emergency room showed left-sided inflammation of the colon and she was admitted for medical management.  She states that the rectal bleeding has subsided and abdominal pain is better.  PAST MEDICAL HISTORY: 1. Ulcerative pancolitis as stated above. 2. Hypertension. 3. Gastroesophageal reflux disease. 4. History of arthritis. 5. History of dysphagia and esophageal ring. 6. History of hiatal hernia.  CURRENT MEDICATIONS: 1. Solu-Medrol 40 mg IV q.6 h. 2. Asacol 800 mg p.o. t.i.d. 3. Os-Cal. 4. Hydrochlorothiazide. 5. Cyanocobalamin. 6. Levaquin. 7. Lisinopril. 8. Multivitamin. 9. Protonix. 10.K-Dur, doses listed in the hospital record.  ALLERGIES:  STATINS.  FAMILY HISTORY, SOCIAL HISTORY AND REVIEW OF SYSTEMS:  Reviewed on admission H and P dated January 17, 2010, by Dr. Murlean Hark.  I reviewed and I  agree.  PHYSICAL EXAMINATION:  VITAL SIGNS:  Temperature 99.0, pulse 80, blood pressure 127/63 and O2 sats 99% on room air. GENERAL:  Alert, well-nourished, in no acute distress. ABDOMEN:  Left lower quadrant tenderness with minimal guarding, suprapubic tenderness without guarding, soft, nondistended, positive bowel sounds.  LABORATORY DATA:  White blood count is 7.1, hemoglobin 11.6 and platelet count 285.  IMPRESSION:  A 65 year old black female with ulcerative colitis flare that it is responding to IV steroids.  Due to colitis, she was placed on generic Colazal and states that she had been taking it as directed.  She will need to resume that when she leaves the hospital, but may need to reconsider another type of mesalamine if it is not cost prohibitive.  I will recommend transitioning to prednisone from IV steroids tomorrow and she will need to go home on a prednisone taper with further changes as an  outpt. by Dr. Cristina Gong.  We will advance diet in the morning.  Thank you for this consultation for allowing [place] to participate in her care.Lear Ng, MD     VCS/MEDQ  D:  07/18/2010  T:  07/19/2010  Job:  [id number]  cc:   Delia Chimes, NP Ronald Lobo, M.D.  Electronically Signed by Wilford Corner MD on 08/22/2010 04:32:29 PM

## 2010-08-23 ENCOUNTER — Other Ambulatory Visit: Payer: Self-pay | Admitting: Internal Medicine

## 2010-08-23 DIAGNOSIS — R1031 Right lower quadrant pain: Secondary | ICD-10-CM

## 2011-02-13 ENCOUNTER — Other Ambulatory Visit: Payer: Self-pay | Admitting: Internal Medicine

## 2011-02-13 DIAGNOSIS — Z1231 Encounter for screening mammogram for malignant neoplasm of breast: Secondary | ICD-10-CM

## 2011-02-23 ENCOUNTER — Ambulatory Visit
Admission: RE | Admit: 2011-02-23 | Discharge: 2011-02-23 | Disposition: A | Discharge status: Home / Self Care | Payer: BC Managed Care – PPO | Source: Ambulatory Visit | Attending: Internal Medicine | Admitting: Internal Medicine

## 2011-02-23 DIAGNOSIS — Z1231 Encounter for screening mammogram for malignant neoplasm of breast: Secondary | ICD-10-CM

## 2011-09-11 ENCOUNTER — Other Ambulatory Visit (HOSPITAL_COMMUNITY)
Admission: RE | Admit: 2011-09-11 | Discharge: 2011-09-11 | Disposition: A | Discharge status: Home / Self Care | Payer: BC Managed Care – PPO | Source: Ambulatory Visit | Attending: Internal Medicine | Admitting: Internal Medicine

## 2011-09-11 ENCOUNTER — Other Ambulatory Visit: Payer: Self-pay | Admitting: Registered Nurse

## 2011-09-11 DIAGNOSIS — Z01419 Encounter for gynecological examination (general) (routine) without abnormal findings: Secondary | ICD-10-CM | POA: Insufficient documentation

## 2011-09-21 ENCOUNTER — Other Ambulatory Visit: Payer: Self-pay | Admitting: Gastroenterology

## 2011-10-29 ENCOUNTER — Other Ambulatory Visit: Payer: Self-pay | Admitting: Dermatology

## 2012-01-23 ENCOUNTER — Other Ambulatory Visit: Payer: Self-pay | Admitting: Internal Medicine

## 2012-01-23 DIAGNOSIS — Z1231 Encounter for screening mammogram for malignant neoplasm of breast: Secondary | ICD-10-CM

## 2012-02-29 ENCOUNTER — Ambulatory Visit
Admission: RE | Admit: 2012-02-29 | Discharge: 2012-02-29 | Disposition: A | Discharge status: Home / Self Care | Payer: BC Managed Care – PPO | Source: Ambulatory Visit | Attending: Internal Medicine | Admitting: Internal Medicine

## 2012-02-29 DIAGNOSIS — Z1231 Encounter for screening mammogram for malignant neoplasm of breast: Secondary | ICD-10-CM

## 2012-12-12 ENCOUNTER — Other Ambulatory Visit: Payer: Self-pay | Admitting: Gastroenterology

## 2013-01-01 HISTORY — PX: COLON SURGERY: SHX602

## 2013-02-23 ENCOUNTER — Other Ambulatory Visit: Payer: Self-pay

## 2013-02-23 DIAGNOSIS — Z1231 Encounter for screening mammogram for malignant neoplasm of breast: Secondary | ICD-10-CM

## 2013-03-06 ENCOUNTER — Ambulatory Visit
Admission: RE | Admit: 2013-03-06 | Discharge: 2013-03-06 | Disposition: A | Discharge status: Home / Self Care | Payer: Self-pay | Source: Ambulatory Visit

## 2013-03-06 DIAGNOSIS — Z1231 Encounter for screening mammogram for malignant neoplasm of breast: Secondary | ICD-10-CM

## 2014-02-10 ENCOUNTER — Other Ambulatory Visit: Payer: Self-pay

## 2014-02-10 DIAGNOSIS — Z1231 Encounter for screening mammogram for malignant neoplasm of breast: Secondary | ICD-10-CM

## 2014-03-02 ENCOUNTER — Other Ambulatory Visit: Payer: Self-pay | Admitting: Gastroenterology

## 2014-03-12 ENCOUNTER — Ambulatory Visit
Admission: RE | Admit: 2014-03-12 | Discharge: 2014-03-12 | Disposition: A | Discharge status: Home / Self Care | Payer: BLUE CROSS/BLUE SHIELD | Source: Ambulatory Visit

## 2014-03-12 ENCOUNTER — Other Ambulatory Visit: Payer: Self-pay

## 2014-03-12 DIAGNOSIS — Z1231 Encounter for screening mammogram for malignant neoplasm of breast: Secondary | ICD-10-CM

## 2014-09-18 DIAGNOSIS — R109 Unspecified abdominal pain: Secondary | ICD-10-CM | POA: Diagnosis present

## 2014-09-18 DIAGNOSIS — N390 Urinary tract infection, site not specified: Secondary | ICD-10-CM | POA: Diagnosis not present

## 2014-09-18 DIAGNOSIS — K219 Gastro-esophageal reflux disease without esophagitis: Secondary | ICD-10-CM | POA: Insufficient documentation

## 2014-09-18 DIAGNOSIS — R011 Cardiac murmur, unspecified: Secondary | ICD-10-CM | POA: Diagnosis not present

## 2014-09-18 DIAGNOSIS — Z8639 Personal history of other endocrine, nutritional and metabolic disease: Secondary | ICD-10-CM | POA: Diagnosis not present

## 2014-09-18 DIAGNOSIS — I10 Essential (primary) hypertension: Secondary | ICD-10-CM | POA: Insufficient documentation

## 2014-09-18 DIAGNOSIS — M5136 Other intervertebral disc degeneration, lumbar region: Secondary | ICD-10-CM | POA: Insufficient documentation

## 2014-09-18 NOTE — ED Notes (Signed)
Pt reports pain to L lateral leg for several days, denies injury. sts when she takes statins it makes her muscles feel like they are tight. Pt also had injection in back on Tuesday. Pt also reports lower abdominal pain with painful urination x 1-2 weeks. And pt reports burning sensation in chest into throat for several days- sts it feels like she needs to burp. Pt reports she has had episodes where she feels like she may faint.

## 2014-09-19 ENCOUNTER — Emergency Department (HOSPITAL_COMMUNITY)
Admission: EM | Admit: 2014-09-19 | Discharge: 2014-09-19 | Disposition: A | Discharge status: Home / Self Care | Payer: BLUE CROSS/BLUE SHIELD | Attending: Emergency Medicine | Admitting: Emergency Medicine

## 2014-09-19 ENCOUNTER — Encounter (HOSPITAL_COMMUNITY): Payer: Self-pay | Admitting: Emergency Medicine

## 2014-09-19 ENCOUNTER — Emergency Department (HOSPITAL_COMMUNITY): Payer: BLUE CROSS/BLUE SHIELD

## 2014-09-19 DIAGNOSIS — N390 Urinary tract infection, site not specified: Secondary | ICD-10-CM

## 2014-09-19 DIAGNOSIS — M5136 Other intervertebral disc degeneration, lumbar region: Secondary | ICD-10-CM

## 2014-09-19 DIAGNOSIS — M51369 Other intervertebral disc degeneration, lumbar region without mention of lumbar back pain or lower extremity pain: Secondary | ICD-10-CM

## 2014-09-19 DIAGNOSIS — K219 Gastro-esophageal reflux disease without esophagitis: Secondary | ICD-10-CM

## 2014-09-19 HISTORY — DX: Essential (primary) hypertension: I10

## 2014-09-19 HISTORY — DX: Cardiac murmur, unspecified: R01.1

## 2014-09-19 HISTORY — DX: Ulcerative colitis, unspecified, without complications: K51.90

## 2014-09-19 HISTORY — DX: Other intervertebral disc degeneration, lumbar region without mention of lumbar back pain or lower extremity pain: M51.369

## 2014-09-19 HISTORY — DX: Other intervertebral disc degeneration, lumbar region: M51.36

## 2014-09-19 HISTORY — DX: Other intervertebral disc displacement, lumbar region: M51.26

## 2014-09-19 HISTORY — DX: Hyperlipidemia, unspecified: E78.5

## 2014-09-19 HISTORY — DX: Gastro-esophageal reflux disease without esophagitis: K21.9

## 2014-09-19 LAB — URINALYSIS, ROUTINE W REFLEX MICROSCOPIC
Bilirubin Urine: NEGATIVE
Glucose, UA: NEGATIVE mg/dL
Hgb urine dipstick: NEGATIVE
Ketones, ur: NEGATIVE mg/dL
Nitrite: NEGATIVE
Protein, ur: NEGATIVE mg/dL
Specific Gravity, Urine: 1.011 (ref 1.005–1.030)
Urobilinogen, UA: 0.2 mg/dL (ref 0.0–1.0)
pH: 6.5 (ref 5.0–8.0)

## 2014-09-19 LAB — BASIC METABOLIC PANEL
Anion gap: 9 (ref 5–15)
BUN: 21 mg/dL — ABNORMAL HIGH (ref 6–20)
CO2: 27 mmol/L (ref 22–32)
Calcium: 9.8 mg/dL (ref 8.9–10.3)
Chloride: 106 mmol/L (ref 101–111)
Creatinine, Ser: 1.07 mg/dL — ABNORMAL HIGH (ref 0.44–1.00)
GFR calc Af Amer: >60 mL/min (ref >60)
GFR calc non Af Amer: 52 mL/min — ABNORMAL LOW (ref >60)
Glucose, Bld: 106 mg/dL — ABNORMAL HIGH (ref 65–99)
Potassium: 3.5 mmol/L (ref 3.5–5.1)
Sodium: 142 mmol/L (ref 135–145)

## 2014-09-19 LAB — CBC
HCT: 36.3 % (ref 36.0–46.0)
Hemoglobin: 12.6 g/dL (ref 12.0–15.0)
MCH: 30.4 pg (ref 26.0–34.0)
MCHC: 34.7 g/dL (ref 30.0–36.0)
MCV: 87.7 fL (ref 78.0–100.0)
Platelets: 215 10*3/uL (ref 150–400)
RBC: 4.14 MIL/uL (ref 3.87–5.11)
RDW: 15 % (ref 11.5–15.5)
WBC: 5.5 10*3/uL (ref 4.0–10.5)

## 2014-09-19 LAB — I-STAT TROPONIN, ED: Troponin i, poc: 0.01 ng/mL (ref 0.00–0.08)

## 2014-09-19 LAB — URINE MICROSCOPIC-ADD ON

## 2014-09-19 MED ORDER — OMEPRAZOLE 20 MG PO CPDR: 20.0000 mg | DELAYED_RELEASE_CAPSULE | Freq: Every day | ORAL | Status: DC

## 2014-09-19 MED ORDER — KETOROLAC TROMETHAMINE 60 MG/2ML IM SOLN
60.0000 mg | Freq: Once | INTRAMUSCULAR | Status: AC
  Administered 2014-09-19: 60 mg via INTRAMUSCULAR
  Filled 2014-09-19: qty 2

## 2014-09-19 MED ORDER — METHOCARBAMOL 500 MG PO TABS
1000.0000 mg | ORAL_TABLET | Freq: Once | ORAL | Status: AC
Start: 2014-09-19 — End: 2014-09-19
  Administered 2014-09-19: 1000 mg via ORAL
  Filled 2014-09-19: qty 2

## 2014-09-19 MED ORDER — MELOXICAM 7.5 MG PO TABS: 7.5000 mg | ORAL_TABLET | Freq: Every day | ORAL | Status: DC

## 2014-09-19 MED ORDER — GI COCKTAIL ~~LOC~~
30.0000 mL | Freq: Once | ORAL | Status: AC
  Administered 2014-09-19: 30 mL via ORAL
  Filled 2014-09-19: qty 30

## 2014-09-19 MED ORDER — CEPHALEXIN 500 MG PO CAPS: 500.0000 mg | ORAL_CAPSULE | Freq: Four times a day (QID) | ORAL | Status: DC

## 2014-09-19 MED ORDER — NITROFURANTOIN MONOHYD MACRO 100 MG PO CAPS
100.0000 mg | ORAL_CAPSULE | Freq: Once | ORAL | Status: AC
  Administered 2014-09-19: 100 mg via ORAL
  Filled 2014-09-19: qty 1

## 2014-09-19 NOTE — ED Provider Notes (Signed)
CSN: [id number]     Arrival date & time 09/18/14  2331 History   This chart was scribed for Chavon Lucarelli, MD by Forrestine Him, ED Scribe. This patient was seen in room A09C/A09C and the patient's care was started 3:21 AM.   Chief Complaint  Patient presents with  . Leg Pain  . Abdominal Pain  . Chest Pain   Patient is a 69 y.o. female presenting with dysuria. The history is provided by the patient. No language interpreter was used.  Dysuria Pain quality:  Stabbing and burning Pain severity:  Moderate Onset quality:  Gradual Timing:  Constant Progression:  Unchanged Chronicity:  New Recent urinary tract infections: no   Relieved by:  Nothing Worsened by:  Nothing tried Ineffective treatments:  None tried Urinary symptoms: no hematuria and no bladder incontinence   Associated symptoms: abdominal pain   Associated symptoms: no fever, no nausea and no vomiting   Associated symptoms comment:  Suprpubic Risk factors: not single kidney     HPI Comments: EDWYNA DANGERFIELD is a 69 y.o. female with a PMHx of HTN and hyperlipidemia who presents to the Emergency Department complaining of constant, ongoing L lateral leg pain x several days. No recent injury or trauma. Pain is described as tightness. She attributes discomfort to her statin medication as she typically experiences "tightness" to her left hip.  Had a spinal injection for same as this has been going on for months but not completely gone.   Ms. Frost also reports constant, ongoing suprapubic pain, chest discomfort, and dysuria x 1-2 weeks. Pain is described as a burning sensation into her chest and throat that she says is consistent with her hiatal hernia but she did not twell her doctor and has done nothing for it. No aggravating or alleviating factors at this time. No OTC medications or home remedies attempted prior to arrival. No recent fever, chills, nausea, vomiting, diarrhea, or shortness of breath. Pt with known allergies to  statins.  Past Medical History  Diagnosis Date  . Ulcerative colitis   . Hypertension   . GERD (gastroesophageal reflux disease)   . Hyperlipidemia   . Bulging lumbar disc   . Heart murmur    History reviewed. No pertinent past surgical history. No family history on file. Social History  Substance Use Topics  . Smoking status: Never Smoker   . Smokeless tobacco: None  . Alcohol Use: No   OB History    No data available     Review of Systems  Constitutional: Negative for fever and chills.  Respiratory: Negative for cough and shortness of breath.   Cardiovascular: Negative for leg swelling.  Gastrointestinal: Positive for abdominal pain. Negative for nausea, vomiting and diarrhea.  Genitourinary: Positive for dysuria.  Musculoskeletal: Positive for arthralgias.  Skin: Negative for rash.  Neurological: Negative for headaches.  Psychiatric/Behavioral: Negative for confusion.  All other systems reviewed and are negative.     Allergies  Statins  Home Medications   Prior to Admission medications   Not on File   Triage Vitals: BP 167/70 mmHg  Pulse 74  Temp(Src) 98 F (36.7 C) (Oral)  Resp 16  Ht 5' 3"  (1.6 m)  Wt 184 lb 8 oz (83.689 kg)  BMI 32.69 kg/m2  SpO2 96%   Physical Exam  Constitutional: She is oriented to person, place, and time. She appears well-developed and well-nourished. No distress.  HENT:  Head: Normocephalic and atraumatic.  Mouth/Throat: Oropharynx is clear and moist.  Eyes: Conjunctivae  are normal. Pupils are equal, round, and reactive to light.  Neck: Normal range of motion. Neck supple.  Cardiovascular: Normal rate, regular rhythm, normal heart sounds and intact distal pulses.   Pulmonary/Chest: Effort normal and breath sounds normal. No respiratory distress. She has no wheezes. She has no rales.  Abdominal: Soft. She exhibits no distension. Bowel sounds are increased. There is no tenderness. There is no rigidity, no rebound, no guarding,  no tenderness at McBurney's point and negative Murphy's sign.  Musculoskeletal: Normal range of motion. She exhibits no edema or tenderness.  All compartments are soft to both lower extremities   Neurological: She is alert and oriented to person, place, and time. She has normal reflexes. She displays normal reflexes. She exhibits normal muscle tone.  Skin: Skin is warm and dry.  Psychiatric: She has a normal mood and affect. Judgment normal.  Nursing note and vitals reviewed.   ED Course  Procedures (including critical care time)  DIAGNOSTIC STUDIES: Oxygen Saturation is 96% on RA, adequate by my interpretation.    COORDINATION OF CARE: 3:28 AM- Will order CXR, i-stat troponin, BMP, CBC, i-stat troponin, and EKG. Discussed treatment plan with pt at bedside and pt agreed to plan.     Labs Review Labs Reviewed  BASIC METABOLIC PANEL - Abnormal; Notable for the following:    Glucose, Bld 106 (*)    BUN 21 (*)    Creatinine, Ser 1.07 (*)    GFR calc non Af Amer 52 (*)    All other components within normal limits  CBC  I-STAT TROPOININ, ED    Imaging Review Dg Chest 2 View  09/19/2014   CLINICAL DATA:  69 year old female for with chest pain and burning sensation  EXAM: CHEST  2 VIEW  COMPARISON:  None.  FINDINGS: Two views of the chest do not demonstrate any focal consolidation, pleural effusion, or pneumothorax. There are bilateral increased interstitial prominence, likely atelectatic changes. The cardiac silhouette is within normal limits. The osseous structures appear unremarkable.  IMPRESSION: Minimal bibasilar atelectasis.  No focal consolidation.   Electronically Signed   By: Anner Crete M.D.   On: 09/19/2014 01:43   I have personally reviewed and evaluated these images and lab results as part of my medical decision-making.   EKG Interpretation   Date/Time:  Saturday September 18 2014 23:57:38 EDT Ventricular Rate:  72 PR Interval:  154 QRS Duration: 88 QT Interval:   396 QTC Calculation: 433 R Axis:   -7 Text Interpretation:  Normal sinus rhythm low voltage qrs Confirmed by  [employer]  MD, Ranbir Chew ([id number]) on 09/19/2014 3:32:14 AM      MDM   Final diagnoses:  None    Thigh pain consistent with radicular pain.  Will treat with NSAIDs and follow up with her spine MD.  Suprapubic pain is from UTI, will treat with keflex and close follow up with her PMD.  GI cocktail relieved her throat burning.  In the setting of > 8 hours of continuous pain with normal troponin and negative ekg ACS is excluded.  Pain relieved with GI cocktail will start prilosec.  Close follow up with your PMD strict return precautions given   I personally performed the services described in this documentation, which was scribed in my presence. The recorded information has been reviewed and is accurate.    Veatrice Kells, MD 09/19/14 [phone]

## 2014-10-20 ENCOUNTER — Other Ambulatory Visit: Payer: Self-pay | Admitting: Internal Medicine

## 2014-10-20 ENCOUNTER — Other Ambulatory Visit (HOSPITAL_COMMUNITY)
Admission: RE | Admit: 2014-10-20 | Discharge: 2014-10-20 | Disposition: A | Discharge status: Home / Self Care | Payer: BLUE CROSS/BLUE SHIELD | Source: Ambulatory Visit | Attending: Internal Medicine | Admitting: Internal Medicine

## 2014-10-20 DIAGNOSIS — Z01419 Encounter for gynecological examination (general) (routine) without abnormal findings: Secondary | ICD-10-CM | POA: Insufficient documentation

## 2014-10-20 DIAGNOSIS — Z1151 Encounter for screening for human papillomavirus (HPV): Secondary | ICD-10-CM | POA: Insufficient documentation

## 2014-10-26 LAB — CYTOLOGY - PAP

## 2014-11-30 ENCOUNTER — Encounter: Payer: BLUE CROSS/BLUE SHIELD | Admitting: Neurology

## 2015-01-05 ENCOUNTER — Ambulatory Visit (INDEPENDENT_AMBULATORY_CARE_PROVIDER_SITE_OTHER): Payer: Self-pay | Admitting: Neurology

## 2015-01-05 ENCOUNTER — Ambulatory Visit (INDEPENDENT_AMBULATORY_CARE_PROVIDER_SITE_OTHER): Payer: BLUE CROSS/BLUE SHIELD | Admitting: Neurology

## 2015-01-05 DIAGNOSIS — G5603 Carpal tunnel syndrome, bilateral upper limbs: Secondary | ICD-10-CM | POA: Insufficient documentation

## 2015-01-05 DIAGNOSIS — Z0289 Encounter for other administrative examinations: Secondary | ICD-10-CM

## 2015-01-05 DIAGNOSIS — G5602 Carpal tunnel syndrome, left upper limb: Secondary | ICD-10-CM | POA: Diagnosis not present

## 2015-01-05 DIAGNOSIS — G5601 Carpal tunnel syndrome, right upper limb: Secondary | ICD-10-CM | POA: Diagnosis not present

## 2015-01-05 NOTE — Progress Notes (Signed)
Electrodiagnostic study today showed evidence of bilateral carpal tunnel syndromes, right-sided severe, left side is moderate,

## 2015-01-05 NOTE — Procedures (Signed)
   NCS (NERVE CONDUCTION STUDY) WITH EMG (ELECTROMYOGRAPHY) REPORT   STUDY DATE: January 05 2015 PATIENT NAME: Donna [name] DOB: [birth year] MRN: [id number]    TECHNOLOGIST: Laretta Alstrom ELECTROMYOGRAPHER: Marcial Pacas M.D.  CLINICAL INFORMATION: 71 years old female presented with bilateral hands paresthesia right worse than left.  FINDINGS: NERVE CONDUCTION STUDY: Bilateral ulnar sensory and motor responses were normal.  Bilateral median sensory response showed moderately prolonged peak latency, right worse than left. Bilateral median motor responses showed moderately prolonged distal latency, F wave latency, right worse than left, right side also has decreased to C map amplitude, left side C map amplitude was normal, bilateral conduction velocity was normal   NEEDLE ELECTROMYOGRAPHY: Selective needle examinations was performed at right upper extremity muscles, right cervical paraspinal muscles, left abductor pollicis brevis  Bilateral abductor pollicis brevis: Normal insertion activity, no spontaneous activity, mildly enlarged motor unit potential, with mildly decreased recruitment patterns.  Needle examination of right pronator teres, biceps triceps deltoid, extensor digitorum communis was normal  There was no spontaneous activity at right cervical paraspinal muscles, right C5-6 and 7  IMPRESSION:  This is an abnormal study. There is electrodiagnostic evidence of bilateral median neuropathy across the wrist, consistent with bilateral carpal tunnel syndromes, right-sided severe, left-sided moderate.    INTERPRETING PHYSICIAN:   Marcial Pacas M.D. Ph.D. [facility] [address], [place], [place] [address] [phone]

## 2015-01-12 ENCOUNTER — Encounter (HOSPITAL_COMMUNITY): Payer: Self-pay | Admitting: *Deleted

## 2015-01-12 ENCOUNTER — Encounter (HOSPITAL_COMMUNITY): Admission: EM | Disposition: A | Discharge status: Home / Self Care | Payer: Self-pay | Source: Home / Self Care

## 2015-01-12 ENCOUNTER — Encounter (HOSPITAL_COMMUNITY): Payer: Self-pay | Admitting: Emergency Medicine

## 2015-01-12 ENCOUNTER — Emergency Department (HOSPITAL_COMMUNITY): Payer: BLUE CROSS/BLUE SHIELD

## 2015-01-12 ENCOUNTER — Emergency Department (INDEPENDENT_AMBULATORY_CARE_PROVIDER_SITE_OTHER)
Admission: EM | Admit: 2015-01-12 | Discharge: 2015-01-12 | Disposition: A | Discharge status: Nursing Home (Includes ICF) | Payer: BLUE CROSS/BLUE SHIELD | Source: Home / Self Care | Attending: Emergency Medicine | Admitting: Emergency Medicine

## 2015-01-12 ENCOUNTER — Emergency Department (HOSPITAL_COMMUNITY): Payer: BLUE CROSS/BLUE SHIELD | Admitting: Certified Registered Nurse Anesthetist

## 2015-01-12 ENCOUNTER — Inpatient Hospital Stay (HOSPITAL_COMMUNITY)
Admission: EM | Admit: 2015-01-12 | Discharge: 2015-01-21 | DRG: 330 | Disposition: A | Discharge status: Home / Self Care | Payer: BLUE CROSS/BLUE SHIELD | Attending: General Surgery | Admitting: General Surgery

## 2015-01-12 DIAGNOSIS — Z888 Allergy status to other drugs, medicaments and biological substances status: Secondary | ICD-10-CM

## 2015-01-12 DIAGNOSIS — R1031 Right lower quadrant pain: Secondary | ICD-10-CM | POA: Diagnosis not present

## 2015-01-12 DIAGNOSIS — I10 Essential (primary) hypertension: Secondary | ICD-10-CM | POA: Diagnosis present

## 2015-01-12 DIAGNOSIS — R109 Unspecified abdominal pain: Secondary | ICD-10-CM | POA: Diagnosis not present

## 2015-01-12 DIAGNOSIS — Z79899 Other long term (current) drug therapy: Secondary | ICD-10-CM

## 2015-01-12 DIAGNOSIS — K219 Gastro-esophageal reflux disease without esophagitis: Secondary | ICD-10-CM | POA: Diagnosis present

## 2015-01-12 DIAGNOSIS — R110 Nausea: Secondary | ICD-10-CM | POA: Diagnosis not present

## 2015-01-12 DIAGNOSIS — Z6831 Body mass index (BMI) 31.0-31.9, adult: Secondary | ICD-10-CM

## 2015-01-12 DIAGNOSIS — M51379 Other intervertebral disc degeneration, lumbosacral region without mention of lumbar back pain or lower extremity pain: Secondary | ICD-10-CM | POA: Diagnosis present

## 2015-01-12 DIAGNOSIS — M5137 Other intervertebral disc degeneration, lumbosacral region: Secondary | ICD-10-CM | POA: Diagnosis present

## 2015-01-12 DIAGNOSIS — K562 Volvulus: Secondary | ICD-10-CM | POA: Diagnosis not present

## 2015-01-12 DIAGNOSIS — Z791 Long term (current) use of non-steroidal anti-inflammatories (NSAID): Secondary | ICD-10-CM

## 2015-01-12 DIAGNOSIS — K5190 Ulcerative colitis, unspecified, without complications: Secondary | ICD-10-CM | POA: Diagnosis present

## 2015-01-12 DIAGNOSIS — K567 Ileus, unspecified: Secondary | ICD-10-CM

## 2015-01-12 DIAGNOSIS — E785 Hyperlipidemia, unspecified: Secondary | ICD-10-CM | POA: Diagnosis present

## 2015-01-12 DIAGNOSIS — M5136 Other intervertebral disc degeneration, lumbar region: Secondary | ICD-10-CM | POA: Diagnosis present

## 2015-01-12 HISTORY — DX: Unspecified osteoarthritis, unspecified site: M19.90

## 2015-01-12 HISTORY — PX: LAPAROTOMY: SHX154

## 2015-01-12 HISTORY — DX: Essential (primary) hypertension: I10

## 2015-01-12 HISTORY — PX: RIGHT COLECTOMY: SHX853

## 2015-01-12 HISTORY — DX: Other intervertebral disc degeneration, lumbosacral region: M51.37

## 2015-01-12 LAB — CBC
HCT: 39.9 % (ref 36.0–46.0)
Hemoglobin: 13.5 g/dL (ref 12.0–15.0)
MCH: 29.9 pg (ref 26.0–34.0)
MCHC: 33.8 g/dL (ref 30.0–36.0)
MCV: 88.3 fL (ref 78.0–100.0)
Platelets: 223 10*3/uL (ref 150–400)
RBC: 4.52 MIL/uL (ref 3.87–5.11)
RDW: 14.4 % (ref 11.5–15.5)
WBC: 7.4 10*3/uL (ref 4.0–10.5)

## 2015-01-12 LAB — URINE MICROSCOPIC-ADD ON

## 2015-01-12 LAB — COMPREHENSIVE METABOLIC PANEL
ALT: 28 U/L (ref 14–54)
AST: 35 U/L (ref 15–41)
Albumin: 3.9 g/dL (ref 3.5–5.0)
Alkaline Phosphatase: 67 U/L (ref 38–126)
Anion gap: 11 (ref 5–15)
BUN: 26 mg/dL — ABNORMAL HIGH (ref 6–20)
CO2: 28 mmol/L (ref 22–32)
Calcium: 10 mg/dL (ref 8.9–10.3)
Chloride: 103 mmol/L (ref 101–111)
Creatinine, Ser: 1.24 mg/dL — ABNORMAL HIGH (ref 0.44–1.00)
GFR calc Af Amer: 51 mL/min — ABNORMAL LOW (ref >60)
GFR calc non Af Amer: 44 mL/min — ABNORMAL LOW (ref >60)
Glucose, Bld: 140 mg/dL — ABNORMAL HIGH (ref 65–99)
Potassium: 4.3 mmol/L (ref 3.5–5.1)
Sodium: 142 mmol/L (ref 135–145)
Total Bilirubin: 0.6 mg/dL (ref 0.3–1.2)
Total Protein: 7.1 g/dL (ref 6.5–8.1)

## 2015-01-12 LAB — URINALYSIS, ROUTINE W REFLEX MICROSCOPIC
Bilirubin Urine: NEGATIVE
Glucose, UA: NEGATIVE mg/dL
Hgb urine dipstick: NEGATIVE
Ketones, ur: NEGATIVE mg/dL
Nitrite: NEGATIVE
Protein, ur: NEGATIVE mg/dL
Specific Gravity, Urine: 1.014 (ref 1.005–1.030)
pH: 6 (ref 5.0–8.0)

## 2015-01-12 LAB — POCT URINALYSIS DIP (DEVICE)
Bilirubin Urine: NEGATIVE
Glucose, UA: NEGATIVE mg/dL
Hgb urine dipstick: NEGATIVE
Ketones, ur: NEGATIVE mg/dL
Nitrite: NEGATIVE
Protein, ur: NEGATIVE mg/dL
Specific Gravity, Urine: 1.015 (ref 1.005–1.030)
Urobilinogen, UA: 0.2 mg/dL (ref 0.0–1.0)
pH: 6 (ref 5.0–8.0)

## 2015-01-12 LAB — LIPASE, BLOOD: Lipase: 46 U/L (ref 11–51)

## 2015-01-12 SURGERY — LAPAROTOMY, EXPLORATORY
Anesthesia: General | Site: Abdomen | Laterality: Right

## 2015-01-12 MED ORDER — MORPHINE SULFATE (PF) 4 MG/ML IV SOLN
4.0000 mg | Freq: Once | INTRAVENOUS | Status: AC
Start: 2015-01-12 — End: 2015-01-12
  Administered 2015-01-12: 4 mg via INTRAVENOUS
  Filled 2015-01-12: qty 1

## 2015-01-12 MED ORDER — DEXTROSE 5 % IV SOLN
2.0000 g | Freq: Once | INTRAVENOUS | Status: AC
  Administered 2015-01-12: 2 g via INTRAVENOUS
  Filled 2015-01-12: qty 2

## 2015-01-12 MED ORDER — SODIUM CHLORIDE 0.9 % IV BOLUS (SEPSIS)
1000.0000 mL | Freq: Once | INTRAVENOUS | Status: AC
  Administered 2015-01-12: 1000 mL via INTRAVENOUS

## 2015-01-12 MED ORDER — HYDROMORPHONE HCL 1 MG/ML IJ SOLN
1.0000 mg | Freq: Once | INTRAMUSCULAR | Status: AC
  Administered 2015-01-12: 1 mg via INTRAVENOUS
  Filled 2015-01-12: qty 1

## 2015-01-12 MED ORDER — MORPHINE SULFATE (PF) 2 MG/ML IV SOLN: 2.0000 mg | Freq: Once | INTRAVENOUS | Status: DC

## 2015-01-12 MED ORDER — PROPOFOL 10 MG/ML IV BOLUS
INTRAVENOUS | Status: AC
  Filled 2015-01-12: qty 20

## 2015-01-12 MED ORDER — ONDANSETRON HCL 4 MG/2ML IJ SOLN
4.0000 mg | Freq: Once | INTRAMUSCULAR | Status: AC
  Administered 2015-01-12: 4 mg via INTRAVENOUS
  Filled 2015-01-12: qty 2

## 2015-01-12 MED ORDER — MIDAZOLAM HCL 2 MG/2ML IJ SOLN
INTRAMUSCULAR | Status: AC
  Filled 2015-01-12: qty 2

## 2015-01-12 MED ORDER — OXYCODONE-ACETAMINOPHEN 5-325 MG PO TABS
ORAL_TABLET | ORAL | Status: AC
  Filled 2015-01-12: qty 1

## 2015-01-12 MED ORDER — IOHEXOL 300 MG/ML  SOLN
100.0000 mL | Freq: Once | INTRAMUSCULAR | Status: AC | PRN
  Administered 2015-01-12: 100 mL via INTRAVENOUS

## 2015-01-12 MED ORDER — OXYCODONE-ACETAMINOPHEN 5-325 MG PO TABS
1.0000 | ORAL_TABLET | Freq: Once | ORAL | Status: AC
  Administered 2015-01-12: 1 via ORAL

## 2015-01-12 MED ORDER — METRONIDAZOLE IN NACL 5-0.79 MG/ML-% IV SOLN
500.0000 mg | Freq: Once | INTRAVENOUS | Status: AC
Start: 2015-01-12 — End: 2015-01-12
  Administered 2015-01-12: 500 mg via INTRAVENOUS
  Filled 2015-01-12: qty 100

## 2015-01-12 MED ORDER — FENTANYL CITRATE (PF) 250 MCG/5ML IJ SOLN
INTRAMUSCULAR | Status: AC
  Filled 2015-01-12: qty 5

## 2015-01-12 SURGICAL SUPPLY — 51 items
BLADE SURG ROTATE 9660 (MISCELLANEOUS) IMPLANT
CANISTER SUCTION 2500CC (MISCELLANEOUS) ×2 IMPLANT
CHLORAPREP W/TINT 26ML (MISCELLANEOUS) ×2 IMPLANT
COVER MAYO STAND STRL (DRAPES) IMPLANT
COVER SURGICAL LIGHT HANDLE (MISCELLANEOUS) ×2 IMPLANT
DRAPE LAPAROSCOPIC ABDOMINAL (DRAPES) ×2 IMPLANT
DRAPE PROXIMA HALF (DRAPES) IMPLANT
DRAPE UTILITY XL STRL (DRAPES) ×4 IMPLANT
DRAPE WARM FLUID 44X44 (DRAPE) ×2 IMPLANT
DRSG OPSITE POSTOP 4X10 (GAUZE/BANDAGES/DRESSINGS) IMPLANT
DRSG OPSITE POSTOP 4X8 (GAUZE/BANDAGES/DRESSINGS) IMPLANT
ELECT BLADE 6.5 EXT (BLADE) IMPLANT
ELECT CAUTERY BLADE 6.4 (BLADE) ×4 IMPLANT
ELECT REM PT RETURN 9FT ADLT (ELECTROSURGICAL) ×2
ELECTRODE REM PT RTRN 9FT ADLT (ELECTROSURGICAL) ×1 IMPLANT
GLOVE BIO SURGEON STRL SZ7 (GLOVE) ×3 IMPLANT
GLOVE BIOGEL PI IND STRL 7.5 (GLOVE) ×1 IMPLANT
GLOVE BIOGEL PI INDICATOR 7.5 (GLOVE) ×4
GLOVE SURG SS PI 7.5 STRL IVOR (GLOVE) ×2 IMPLANT
GOWN STRL REUS W/ TWL LRG LVL3 (GOWN DISPOSABLE) ×3 IMPLANT
GOWN STRL REUS W/TWL LRG LVL3 (GOWN DISPOSABLE) ×6
KIT BASIN OR (CUSTOM PROCEDURE TRAY) ×2 IMPLANT
KIT ROOM TURNOVER OR (KITS) ×2 IMPLANT
LIGASURE IMPACT 36 18CM CVD LR (INSTRUMENTS) ×1 IMPLANT
NS IRRIG 1000ML POUR BTL (IV SOLUTION) ×4 IMPLANT
PACK GENERAL/GYN (CUSTOM PROCEDURE TRAY) ×2 IMPLANT
PAD ARMBOARD 7.5X6 YLW CONV (MISCELLANEOUS) ×2 IMPLANT
PENCIL BUTTON HOLSTER BLD 10FT (ELECTRODE) IMPLANT
RELOAD PROXIMATE 75MM BLUE (ENDOMECHANICALS) ×4 IMPLANT
RELOAD STAPLE 75 3.8 BLU REG (ENDOMECHANICALS) IMPLANT
SPECIMEN JAR LARGE (MISCELLANEOUS) ×1 IMPLANT
SPONGE GAUZE 4X4 12PLY STER LF (GAUZE/BANDAGES/DRESSINGS) ×1 IMPLANT
SPONGE LAP 18X18 X RAY DECT (DISPOSABLE) ×1 IMPLANT
STAPLER GUN LINEAR PROX 60 (STAPLE) ×1 IMPLANT
STAPLER PROXIMATE 75MM BLUE (STAPLE) ×1 IMPLANT
STAPLER VISISTAT 35W (STAPLE) ×2 IMPLANT
SUCTION POOLE TIP (SUCTIONS) ×2 IMPLANT
SUT PDS AB 1 TP1 96 (SUTURE) ×4 IMPLANT
SUT SILK 2 0 (SUTURE) ×2
SUT SILK 2 0 SH CR/8 (SUTURE) ×2 IMPLANT
SUT SILK 2-0 18XBRD TIE 12 (SUTURE) ×1 IMPLANT
SUT SILK 3 0 (SUTURE) ×2
SUT SILK 3 0 SH CR/8 (SUTURE) ×2 IMPLANT
SUT SILK 3-0 18XBRD TIE 12 (SUTURE) ×1 IMPLANT
SUT VIC AB 3-0 SH 27 (SUTURE)
SUT VIC AB 3-0 SH 27X BRD (SUTURE) IMPLANT
TAPE CLOTH SURG 4X10 WHT LF (GAUZE/BANDAGES/DRESSINGS) ×1 IMPLANT
TOWEL OR 17X26 10 PK STRL BLUE (TOWEL DISPOSABLE) ×2 IMPLANT
TRAY FOLEY CATH 16FRSI W/METER (SET/KITS/TRAYS/PACK) ×1 IMPLANT
TUBE CONNECTING 12X1/4 (SUCTIONS) IMPLANT
YANKAUER SUCT BULB TIP NO VENT (SUCTIONS) IMPLANT

## 2015-01-12 NOTE — Anesthesia Preprocedure Evaluation (Signed)
Anesthesia Evaluation  Patient identified by MRN, date of birth, ID band Patient awake    Reviewed: Allergy & Precautions, NPO status , Patient's Chart, lab work & pertinent test results  Airway Mallampati: II  TM Distance: >3 FB Neck ROM: Full    Dental   Pulmonary neg pulmonary ROS,    breath sounds clear to auscultation       Cardiovascular hypertension,  Rhythm:Regular Rate:Normal     Neuro/Psych    GI/Hepatic Neg liver ROS, PUD, GERD  ,  Endo/Other  negative endocrine ROS  Renal/GU negative Renal ROS     Musculoskeletal   Abdominal   Peds  Hematology   Anesthesia Other Findings   Reproductive/Obstetrics                             Anesthesia Physical Anesthesia Plan  ASA: III  Anesthesia Plan: General   Post-op Pain Management:    Induction: Intravenous, Rapid sequence and Cricoid pressure planned  Airway Management Planned: Oral ETT  Additional Equipment:   Intra-op Plan:   Post-operative Plan: Possible Post-op intubation/ventilation  Informed Consent: I have reviewed the patients History and Physical, chart, labs and discussed the procedure including the risks, benefits and alternatives for the proposed anesthesia with the patient or authorized representative who has indicated his/her understanding and acceptance.   Dental advisory given  Plan Discussed with: CRNA and Anesthesiologist  Anesthesia Plan Comments:         Anesthesia Quick Evaluation

## 2015-01-12 NOTE — ED Notes (Signed)
Patient transported to CT 

## 2015-01-12 NOTE — H&P (Signed)
Donna [name] is an 70 y.o. female.   Chief Complaint: abd pain HPI: 41 yof with history of uc cared for by Dr Cristina Gong, gets annual csc that she says is normal, treated with apreso and doing well.  She has one day history of increasing abdominal pain, inability to have flatus or stool, some nausea no emesis. Pain becoming unbearable nothing helping.  Underwent ct scan that shows distended right colon with cecal volvulus.   Past Medical History  Diagnosis Date  . Ulcerative colitis ([place])   . Hypertension   . GERD (gastroesophageal reflux disease)   . Hyperlipidemia   . Bulging lumbar disc   . Heart murmur     History reviewed. No pertinent past surgical history. 2 c/s  History reviewed. No pertinent family history. Social History:  reports that she has never smoked. She does not have any smokeless tobacco history on file. She reports that she does not drink alcohol or use illicit drugs.  Allergies:  Allergies  Allergen Reactions  . Statins Other (See Comments)    Muscle spasms, hives.      meds reviewed  Results for orders placed or performed during the hospital encounter of 01/12/15 (from the past 48 hour(s))  Lipase, blood     Status: None   Collection Time: 01/12/15  4:50 PM  Result Value Ref Range   Lipase 46 11 - 51 U/L  Comprehensive metabolic panel     Status: Abnormal   Collection Time: 01/12/15  4:50 PM  Result Value Ref Range   Sodium 142 135 - 145 mmol/L   Potassium 4.3 3.5 - 5.1 mmol/L   Chloride 103 101 - 111 mmol/L   CO2 28 22 - 32 mmol/L   Glucose, Bld 140 (H) 65 - 99 mg/dL   BUN 26 (H) 6 - 20 mg/dL   Creatinine, Ser 1.24 (H) 0.44 - 1.00 mg/dL   Calcium 10.0 8.9 - 10.3 mg/dL   Total Protein 7.1 6.5 - 8.1 g/dL   Albumin 3.9 3.5 - 5.0 g/dL   AST 35 15 - 41 U/L   ALT 28 14 - 54 U/L   Alkaline Phosphatase 67 38 - 126 U/L   Total Bilirubin 0.6 0.3 - 1.2 mg/dL   GFR calc non Af Amer 44 (L) >60 mL/min   GFR calc Af Amer 51 (L) >60 mL/min    Comment:  (NOTE) The eGFR has been calculated using the CKD EPI equation. This calculation has not been validated in all clinical situations. eGFR's persistently <60 mL/min signify possible Chronic Kidney Disease.    Anion gap 11 5 - 15  CBC     Status: None   Collection Time: 01/12/15  4:50 PM  Result Value Ref Range   WBC 7.4 4.0 - 10.5 K/uL   RBC 4.52 3.87 - 5.11 MIL/uL   Hemoglobin 13.5 12.0 - 15.0 g/dL   HCT 39.9 36.0 - 46.0 %   MCV 88.3 78.0 - 100.0 fL   MCH 29.9 26.0 - 34.0 pg   MCHC 33.8 30.0 - 36.0 g/dL   RDW 14.4 11.5 - 15.5 %   Platelets 223 150 - 400 K/uL  Urinalysis, Routine w reflex microscopic (not at [facility])     Status: Abnormal   Collection Time: 01/12/15  5:00 PM  Result Value Ref Range   Color, Urine YELLOW YELLOW   APPearance CLEAR CLEAR   Specific Gravity, Urine 1.014 1.005 - 1.030   pH 6.0 5.0 - 8.0   Glucose,  UA NEGATIVE NEGATIVE mg/dL   Hgb urine dipstick NEGATIVE NEGATIVE   Bilirubin Urine NEGATIVE NEGATIVE   Ketones, ur NEGATIVE NEGATIVE mg/dL   Protein, ur NEGATIVE NEGATIVE mg/dL   Nitrite NEGATIVE NEGATIVE   Leukocytes, UA SMALL (A) NEGATIVE  Urine microscopic-add on     Status: Abnormal   Collection Time: 01/12/15  5:00 PM  Result Value Ref Range   Squamous Epithelial / LPF 0-5 (A) NONE SEEN   WBC, UA 0-5 0 - 5 WBC/hpf   RBC / HPF 0-5 0 - 5 RBC/hpf   Bacteria, UA RARE (A) NONE SEEN   Ct Abdomen Pelvis W Contrast  01/12/2015  CLINICAL DATA:  70 year old female with sharp right lower quadrant abdominal pain. EXAM: CT ABDOMEN AND PELVIS WITH CONTRAST TECHNIQUE: Multidetector CT imaging of the abdomen and pelvis was performed using the standard protocol following bolus administration of intravenous contrast. CONTRAST:  15m OMNIPAQUE IOHEXOL 300 MG/ML  SOLN COMPARISON:  CT dated 07/18/2010 FINDINGS: Bibasilar dependent atelectatic changes/ scarring. No intra-abdominal free air. There is a small free fluid in the right upper abdomen anterior to the liver  adjacent to the cecum. The liver, gallbladder, pancreas, spleen, left adrenal gland appear unremarkable. Stable right adrenal hypodense lesion, incompletely characterized, likely an adenoma. There is no hydronephrosis. Stable small left renal hypodense lesion is not well characterized but likely represents a cyst. The visualized ureters and urinary bladder appear unremarkable. The uterus and ovaries are grossly unremarkable. There is twisting of the proximal transverse colon with axial torsion of the cecum compatible with a cecal volvulus. There is obstruction of the colon at the torsion with distention of the cecum. There is no evidence of small-bowel obstruction. The appendix appears unremarkable. There is no pneumatosis. Mild atherosclerotic calcification of the aorta. The abdominal aorta and IVC appear patent. No portal venous gas identified. There is no adenopathy. The abdominal wall soft tissues appear unremarkable. There is scoliosis with degenerative changes of the spine. No acute fracture. 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 Clinical correlation and surgical consult is advised. No pneumatosis or portal venous gas identified. Critical Value/emergent results were called by telephone at the time of interpretation on 01/12/2015 at 10:13 pm to Dr WEulis Foster who verbally acknowledged these results. Electronically Signed   By: AAnner CreteM.D.   On: 01/12/2015 22:17    Review of Systems  Constitutional: Negative for fever and chills.  Respiratory: Negative for cough and shortness of breath.   Cardiovascular: Negative for chest pain.  Gastrointestinal: Positive for nausea, abdominal pain and constipation. Negative for vomiting and diarrhea.    Blood pressure 166/85, pulse 102, temperature 97.9 F (36.6 C), temperature source Oral, resp. rate 18, height 5' 3.5" (1.613 m), weight 81.647 kg (180 lb), SpO2 100 %. Physical Exam  Vitals  reviewed. Constitutional: She appears well-developed and well-nourished. She appears distressed.  Eyes: No scleral icterus.  Cardiovascular: Normal rate, regular rhythm and normal heart sounds.   Respiratory: Effort normal. She has no wheezes. She has no rales.  GI: She exhibits distension. Bowel sounds are decreased. There is tenderness in the right upper quadrant, right lower quadrant and epigastric area.  Lymphadenopathy:    She has no cervical adenopathy.     Assessment/Plan [name]  Plan or urgently for right colectomy, procedure indications and risks discussed  Kamila Broda 01/12/2015, 10:53 PM

## 2015-01-12 NOTE — ED Notes (Signed)
Dr Donne Hazel in at bedside.

## 2015-01-12 NOTE — ED Provider Notes (Signed)
  Face-to-face evaluation   History: She developed lower abdominal pain, today, rather abruptly around 11 AM. He felt nauseated but has not vomited. She had a normal bowel movement, and was able to eat, before that.  Physical exam: Alert, elderly patient who is in moderate pain. She is tearful. Abdomen has hypoactive bowel sounds. Abdomen is distended and diffusely tender.  Medical screening examination/treatment/procedure(s) were conducted as a shared visit with non-physician practitioner(s) and myself.  I personally evaluated the patient during the encounter  Daleen Bo, MD 01/13/15 [phone]

## 2015-01-12 NOTE — ED Provider Notes (Signed)
CSN: [id number]     Arrival date & time 01/12/15  1607 History   First MD Initiated Contact with Patient 01/12/15 2009     Chief Complaint  Patient presents with  . Abdominal Pain  . Back Pain     (Consider location/radiation/quality/duration/timing/severity/associated sxs/prior Treatment) The history is provided by the patient and medical records. No language interpreter was used.     Donna [name] is a 70 y.o. female  with a hx of ulcerative colitis (on [place]), HTN, GERD, bulging lumbar disc (last epidural injection in Oct 2016), presents to the Emergency Department from [facility] complaining of gradual, persistent, progressively worsening RLQ abd pain and right sided back pain onset 11am. Associated symptoms include abd bloating, anorexia, and nausea without vomiting. Pt describes the pain as cramping like a knot with sharp pains.  Nothing makes it better and nothing makes it worse.  Pt reports 2 c-sections approx 30 years ago but no other abd surgeries.  Pt denies fevers, chills, headache, neck pain, chest pain, SOB, vomiting, diarrhea, melena, hematochezia, weakness, dizziness, syncope, dysuria, hematuria, vaginal discharge.   Last oral intake was 10:30am.   Past Medical History  Diagnosis Date  . Ulcerative colitis ([place])   . Hypertension   . GERD (gastroesophageal reflux disease)   . Hyperlipidemia   . Bulging lumbar disc   . Heart murmur    History reviewed. No pertinent past surgical history. History reviewed. No pertinent family history. Social History  Substance Use Topics  . Smoking status: Never Smoker   . Smokeless tobacco: None  . Alcohol Use: No   OB History    No data available     Review of Systems  Constitutional: Positive for appetite change. Negative for fever, diaphoresis, fatigue and unexpected weight change.  HENT: Negative for mouth sores.   Eyes: Negative for visual disturbance.  Respiratory: Negative for cough, chest tightness, shortness of breath and  wheezing.   Cardiovascular: Negative for chest pain.  Gastrointestinal: Positive for nausea and abdominal pain. Negative for vomiting, diarrhea and constipation.  Endocrine: Negative for polydipsia, polyphagia and polyuria.  Genitourinary: Negative for dysuria, urgency, frequency and hematuria.  Musculoskeletal: Positive for back pain (right lower). Negative for neck stiffness.  Skin: Negative for rash.  Allergic/Immunologic: Negative for immunocompromised state.  Neurological: Negative for syncope, light-headedness and headaches.  Hematological: Does not bruise/bleed easily.  Psychiatric/Behavioral: Negative for sleep disturbance. The patient is not nervous/anxious.       Allergies  Statins  Home Medications   Prior to Admission medications   Medication Sig Start Date End Date Taking? Authorizing Provider  Calcium Carbonate-Vitamin D (CALCIUM-VITAMIN D3 PO) Take 1 tablet by mouth 2 (two) times daily.   Yes Historical Provider, MD  Calcium Citrate-Vitamin D (CALCIUM + D PO) Take 1 tablet by mouth 2 (two) times daily.   Yes Historical Provider, MD  HYDROcodone-acetaminophen (NORCO/VICODIN) 5-325 MG tablet Take 1 tablet by mouth every 6 (six) hours as needed for moderate pain.   Yes Historical Provider, MD  lisinopril-hydrochlorothiazide (PRINZIDE,ZESTORETIC) 20-12.5 MG tablet Take 1 tablet by mouth daily.   Yes Historical Provider, MD  Multiple Vitamin (MULTIVITAMIN WITH MINERALS) TABS tablet Take 1 tablet by mouth daily.   Yes Historical Provider, MD  nabumetone (RELAFEN) 750 MG tablet Take 750 mg by mouth 2 (two) times daily.   Yes Historical Provider, MD  Omega-3 Fatty Acids (FISH OIL) 1000 MG CAPS Take 1 capsule by mouth 2 (two) times daily.   Yes Historical Provider, MD  pantoprazole (PROTONIX) 40 MG tablet Take 40 mg by mouth 2 (two) times daily.   Yes Historical Provider, MD  Red Yeast Rice Extract (RED YEAST RICE PO) Take 1 tablet by mouth 2 (two) times daily.   Yes Historical  Provider, MD  cephALEXin (KEFLEX) 500 MG capsule Take 1 capsule (500 mg total) by mouth 4 (four) times daily. Patient not taking: Reported on 01/12/2015 09/19/14   April Palumbo, MD  meloxicam (MOBIC) 7.5 MG tablet Take 1 tablet (7.5 mg total) by mouth daily. Patient not taking: Reported on 01/12/2015 09/19/14   April Palumbo, MD  omeprazole (PRILOSEC) 20 MG capsule Take 1 capsule (20 mg total) by mouth daily. Patient not taking: Reported on 01/12/2015 09/19/14   April Palumbo, MD   BP 166/85 mmHg  Pulse 102  Temp(Src) 97.9 F (36.6 C) (Oral)  Resp 18  Ht 5' 3.5" (1.613 m)  Wt 81.647 kg  BMI 31.38 kg/m2  SpO2 100% Physical Exam  Constitutional: She appears well-developed and well-nourished. No distress.  Awake, alert, nontoxic appearance  HENT:  Head: Normocephalic and atraumatic.  Mouth/Throat: Oropharynx is clear and moist. No oropharyngeal exudate.  Eyes: Conjunctivae are normal. No scleral icterus.  Neck: Normal range of motion. Neck supple.  Cardiovascular: Regular rhythm, normal heart sounds and intact distal pulses.  Tachycardia present.   Pulses:      Radial pulses are 2+ on the right side, and 2+ on the left side.       Dorsalis pedis pulses are 2+ on the right side, and 2+ on the left side.  Pulmonary/Chest: Effort normal and breath sounds normal. No respiratory distress. She has no wheezes.  Equal chest expansion  Abdominal: Soft. Bowel sounds are normal. She exhibits distension. She exhibits no mass. There is tenderness in the right lower quadrant. There is guarding. There is no rebound and no CVA tenderness.  Musculoskeletal: Normal range of motion. She exhibits no edema.  Neurological: She is alert.  Speech is clear and goal oriented Moves extremities without ataxia  Skin: Skin is warm and dry. She is not diaphoretic.  Psychiatric: She has a normal mood and affect.  Nursing note and vitals reviewed.   ED Course  Procedures (including critical care time) Labs  Review Labs Reviewed  COMPREHENSIVE METABOLIC PANEL - Abnormal; Notable for the following:    Glucose, Bld 140 (*)    BUN 26 (*)    Creatinine, Ser 1.24 (*)    GFR calc non Af Amer 44 (*)    GFR calc Af Amer 51 (*)    All other components within normal limits  URINALYSIS, ROUTINE W REFLEX MICROSCOPIC (NOT AT [facility]) - Abnormal; Notable for the following:    Leukocytes, UA SMALL (*)    All other components within normal limits  URINE MICROSCOPIC-ADD ON - Abnormal; Notable for the following:    Squamous Epithelial / LPF 0-5 (*)    Bacteria, UA RARE (*)    All other components within normal limits  LIPASE, BLOOD  CBC    Imaging Review Ct Abdomen Pelvis W Contrast  01/12/2015  CLINICAL DATA:  70 year old female with sharp right lower quadrant abdominal pain. EXAM: CT ABDOMEN AND PELVIS WITH CONTRAST TECHNIQUE: Multidetector CT imaging of the abdomen and pelvis was performed using the standard protocol following bolus administration of intravenous contrast. CONTRAST:  195m OMNIPAQUE IOHEXOL 300 MG/ML  SOLN COMPARISON:  CT dated 07/18/2010 FINDINGS: Bibasilar dependent atelectatic changes/ scarring. No intra-abdominal free air. There is a small free  fluid in the right upper abdomen anterior to the liver adjacent to the cecum. The liver, gallbladder, pancreas, spleen, left adrenal gland appear unremarkable. Stable right adrenal hypodense lesion, incompletely characterized, likely an adenoma. There is no hydronephrosis. Stable small left renal hypodense lesion is not well characterized but likely represents a cyst. The visualized ureters and urinary bladder appear unremarkable. The uterus and ovaries are grossly unremarkable. There is twisting of the proximal transverse colon with axial torsion of the cecum compatible with a cecal volvulus. There is obstruction of the colon at the torsion with distention of the cecum. There is no evidence of small-bowel obstruction. The appendix appears unremarkable.  There is no pneumatosis. Mild atherosclerotic calcification of the aorta. The abdominal aorta and IVC appear patent. No portal venous gas identified. There is no adenopathy. The abdominal wall soft tissues appear unremarkable. There is scoliosis with degenerative changes of the spine. No acute fracture. 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 Clinical correlation and surgical consult is advised. No pneumatosis or portal venous gas identified. Critical Value/emergent results were called by telephone at the time of interpretation on 01/12/2015 at 10:13 pm to Dr Eulis Foster, who verbally acknowledged these results. Electronically Signed   By: Anner Crete M.D.   On: 01/12/2015 22:17   I have personally reviewed and evaluated these images and lab results as part of my medical decision-making.   MDM   Final diagnoses:  [name] ([place])  Right lower quadrant abdominal pain   Rozell Kettlewell Gahm presents with right lower quadrant and right-sided back pain onset around 11 AM this morning.  Patient does not appear distressed but does have pain with mild guarding on palpation to the right lower quadrant. Labs are reassuring. Slight elevation in serum creatinine to 1.24. Fluids given. Will obtain CT scan.  10:19 PM CT scan with cecal volvulus. Consult to surgery.  10:34 PM Discussed with Dr. Donne Hazel of [facility] who will evaluate and treat.    The patient was discussed with and seen by Dr. Eulis Foster who agrees with the treatment plan.   Jarrett Soho Dryden Tapley, PA-C 01/12/15 1497  Daleen Bo, MD 01/13/15 [phone]

## 2015-01-12 NOTE — ED Notes (Signed)
C/o right lower sharp pain which radiates to right side of stomach Does have nausea Denies any vomiting and urinary problems States pain started after eating cantaloupe

## 2015-01-12 NOTE — ED Notes (Signed)
Pt sent here from [facility], having right side abd pain since 11am, radiates around right side. Having nausea, no vomiting, diarrhea or urinary symptoms.

## 2015-01-12 NOTE — ED Provider Notes (Signed)
CSN: [id number]     Arrival date & time 01/12/15  1346 History   First MD Initiated Contact with Patient 01/12/15 1507     Chief Complaint  Patient presents with  . Back Pain   (Consider location/radiation/quality/duration/timing/severity/associated sxs/prior Treatment) HPI  She is a 70 year old woman here for evaluation of back pain. She states the pain started suddenly this morning around 11 AM. She states she ate some cantaloupe at lunch and then developed a sharp pain in her right mid back that radiates to her right abdomen. There is constant pain, but it does sometimes spike.  She reports nausea, but denies any vomiting. She states she feels like her belly is distended and that if she could pass gas that might be beneficial. She denies any constipation or diarrhea. She does have a history of ulcerative colitis for which she takes medication. She denies any blood in her stool or change in stools the last few days. No fevers or chills.  She denies any dysuria or hematuria. No history of kidney stones. She denies any injury or trauma.  Past Medical History  Diagnosis Date  . Ulcerative colitis ([place])   . Hypertension   . GERD (gastroesophageal reflux disease)   . Hyperlipidemia   . Bulging lumbar disc   . Heart murmur    History reviewed. No pertinent past surgical history. History reviewed. No pertinent family history. Social History  Substance Use Topics  . Smoking status: Never Smoker   . Smokeless tobacco: None  . Alcohol Use: No   OB History    No data available     Review of Systems As in history of present illness Allergies  Statins  Home Medications   Prior to Admission medications   Medication Sig Start Date End Date Taking? Authorizing Provider  cephALEXin (KEFLEX) 500 MG capsule Take 1 capsule (500 mg total) by mouth 4 (four) times daily. 09/19/14   April Palumbo, MD  meloxicam (MOBIC) 7.5 MG tablet Take 1 tablet (7.5 mg total) by mouth daily. 09/19/14   April  Palumbo, MD  omeprazole (PRILOSEC) 20 MG capsule Take 1 capsule (20 mg total) by mouth daily. 09/19/14   April Palumbo, MD   Meds Ordered and Administered this Visit  Medications - No data to display  BP 165/89 mmHg  Pulse 110  Temp(Src) 98.1 F (36.7 C) (Oral)  Resp 16  SpO2 97% No data found.   Physical Exam  Constitutional: She is oriented to person, place, and time. She appears well-developed and well-nourished. She appears distressed (occasionally winces with pain).  Neck: Neck supple.  Cardiovascular: Regular rhythm and normal heart sounds.   No murmur heard. Mild tachycardia  Pulmonary/Chest: Effort normal and breath sounds normal. No respiratory distress. She has no wheezes. She has no rales.  Abdominal: Soft. Bowel sounds are normal. She exhibits distension. She exhibits no mass. There is tenderness (right side). There is no rebound and no guarding.  No CVA tenderness.  Musculoskeletal:  Back: No erythema or edema. No palpable tenderness.  Neurological: She is alert and oriented to person, place, and time.    ED Course  Procedures (including critical care time)  Labs Review Labs Reviewed  POCT URINALYSIS DIP (DEVICE) - Abnormal; Notable for the following:    Leukocytes, UA SMALL (*)    All other components within normal limits    Imaging Review No results found.    MDM   1. Right lower quadrant abdominal pain   2. Right flank pain  3. Nausea    UA negative for hematuria. Differential includes a kidney process, ulcerative colitis, appendicitis. We'll transfer to [facility] ED via shuttle for additional workup and evaluation.  2 mg IM morphine given prior to transfer.    Melony Overly, MD 01/12/15 [phone]

## 2015-01-13 ENCOUNTER — Encounter (HOSPITAL_COMMUNITY): Payer: Self-pay | Admitting: General Practice

## 2015-01-13 DIAGNOSIS — E785 Hyperlipidemia, unspecified: Secondary | ICD-10-CM | POA: Diagnosis present

## 2015-01-13 DIAGNOSIS — Z791 Long term (current) use of non-steroidal anti-inflammatories (NSAID): Secondary | ICD-10-CM | POA: Diagnosis not present

## 2015-01-13 DIAGNOSIS — Z888 Allergy status to other drugs, medicaments and biological substances status: Secondary | ICD-10-CM | POA: Diagnosis not present

## 2015-01-13 DIAGNOSIS — R1031 Right lower quadrant pain: Secondary | ICD-10-CM | POA: Diagnosis present

## 2015-01-13 DIAGNOSIS — K567 Ileus, unspecified: Secondary | ICD-10-CM | POA: Diagnosis not present

## 2015-01-13 DIAGNOSIS — Z79899 Other long term (current) drug therapy: Secondary | ICD-10-CM | POA: Diagnosis not present

## 2015-01-13 DIAGNOSIS — K219 Gastro-esophageal reflux disease without esophagitis: Secondary | ICD-10-CM | POA: Diagnosis present

## 2015-01-13 DIAGNOSIS — K5190 Ulcerative colitis, unspecified, without complications: Secondary | ICD-10-CM | POA: Diagnosis present

## 2015-01-13 DIAGNOSIS — I10 Essential (primary) hypertension: Secondary | ICD-10-CM | POA: Diagnosis present

## 2015-01-13 DIAGNOSIS — K562 Volvulus: Secondary | ICD-10-CM | POA: Diagnosis present

## 2015-01-13 DIAGNOSIS — Z6831 Body mass index (BMI) 31.0-31.9, adult: Secondary | ICD-10-CM | POA: Diagnosis not present

## 2015-01-13 DIAGNOSIS — M5136 Other intervertebral disc degeneration, lumbar region: Secondary | ICD-10-CM | POA: Diagnosis present

## 2015-01-13 HISTORY — DX: Volvulus: K56.2

## 2015-01-13 LAB — BASIC METABOLIC PANEL
Anion gap: 7 (ref 5–15)
BUN: 14 mg/dL (ref 6–20)
CO2: 30 mmol/L (ref 22–32)
Calcium: 9.2 mg/dL (ref 8.9–10.3)
Chloride: 107 mmol/L (ref 101–111)
Creatinine, Ser: 1 mg/dL (ref 0.44–1.00)
GFR calc Af Amer: >60 mL/min (ref >60)
GFR calc non Af Amer: 57 mL/min — ABNORMAL LOW (ref >60)
Glucose, Bld: 187 mg/dL — ABNORMAL HIGH (ref 65–99)
Potassium: 3.8 mmol/L (ref 3.5–5.1)
Sodium: 144 mmol/L (ref 135–145)

## 2015-01-13 LAB — CBC
HCT: 37.7 % (ref 36.0–46.0)
Hemoglobin: 12.7 g/dL (ref 12.0–15.0)
MCH: 30.2 pg (ref 26.0–34.0)
MCHC: 33.7 g/dL (ref 30.0–36.0)
MCV: 89.8 fL (ref 78.0–100.0)
Platelets: 199 10*3/uL (ref 150–400)
RBC: 4.2 MIL/uL (ref 3.87–5.11)
RDW: 14.6 % (ref 11.5–15.5)
WBC: 10.2 10*3/uL (ref 4.0–10.5)

## 2015-01-13 MED ORDER — FENTANYL CITRATE (PF) 100 MCG/2ML IJ SOLN
25.0000 ug | INTRAMUSCULAR | Status: DC | PRN
Start: 2015-01-13 — End: 2015-01-13
  Administered 2015-01-13 (×3): 50 ug via INTRAVENOUS

## 2015-01-13 MED ORDER — LISINOPRIL 20 MG PO TABS
20.0000 mg | ORAL_TABLET | Freq: Every day | ORAL | Status: DC
  Administered 2015-01-14 – 2015-01-21 (×7): 20 mg via ORAL
  Filled 2015-01-13 (×8): qty 1

## 2015-01-13 MED ORDER — ONDANSETRON HCL 4 MG/2ML IJ SOLN
4.0000 mg | Freq: Four times a day (QID) | INTRAMUSCULAR | Status: DC | PRN
  Administered 2015-01-14 – 2015-01-18 (×6): 4 mg via INTRAVENOUS
  Filled 2015-01-13 (×6): qty 2

## 2015-01-13 MED ORDER — PANTOPRAZOLE SODIUM 40 MG PO TBEC
40.0000 mg | DELAYED_RELEASE_TABLET | Freq: Two times a day (BID) | ORAL | Status: DC
  Administered 2015-01-13 – 2015-01-21 (×16): 40 mg via ORAL
  Filled 2015-01-13 (×18): qty 1

## 2015-01-13 MED ORDER — SODIUM CHLORIDE 0.9 % IJ SOLN: 9.0000 mL | INTRAMUSCULAR | Status: DC | PRN

## 2015-01-13 MED ORDER — FENTANYL CITRATE (PF) 100 MCG/2ML IJ SOLN
INTRAMUSCULAR | Status: AC
  Administered 2015-01-14: 07:00:00
  Filled 2015-01-13: qty 2

## 2015-01-13 MED ORDER — METOCLOPRAMIDE HCL 5 MG/ML IJ SOLN
INTRAMUSCULAR | Status: AC
  Administered 2015-01-14: 07:00:00
  Filled 2015-01-13: qty 2

## 2015-01-13 MED ORDER — FENTANYL CITRATE (PF) 100 MCG/2ML IJ SOLN
50.0000 ug | Freq: Once | INTRAMUSCULAR | Status: AC
  Administered 2015-01-13: 50 ug via INTRAVENOUS

## 2015-01-13 MED ORDER — FENTANYL CITRATE (PF) 100 MCG/2ML IJ SOLN
INTRAMUSCULAR | Status: AC
  Filled 2015-01-13: qty 2

## 2015-01-13 MED ORDER — HYDROCHLOROTHIAZIDE 12.5 MG PO CAPS
12.5000 mg | ORAL_CAPSULE | Freq: Every day | ORAL | Status: DC
Start: 2015-01-14 — End: 2015-01-21
  Administered 2015-01-14 – 2015-01-21 (×7): 12.5 mg via ORAL
  Filled 2015-01-13 (×8): qty 1

## 2015-01-13 MED ORDER — DEXAMETHASONE SODIUM PHOSPHATE 4 MG/ML IJ SOLN
INTRAMUSCULAR | Status: AC
  Filled 2015-01-13: qty 1

## 2015-01-13 MED ORDER — METOCLOPRAMIDE HCL 5 MG/ML IJ SOLN
10.0000 mg | Freq: Once | INTRAMUSCULAR | Status: AC | PRN
  Administered 2015-01-13: 10 mg via INTRAVENOUS

## 2015-01-13 MED ORDER — NALOXONE HCL 0.4 MG/ML IJ SOLN: 0.4000 mg | INTRAMUSCULAR | Status: DC | PRN

## 2015-01-13 MED ORDER — ARTIFICIAL TEARS OP OINT
TOPICAL_OINTMENT | OPHTHALMIC | Status: DC | PRN
  Administered 2015-01-12: 1 via OPHTHALMIC

## 2015-01-13 MED ORDER — ACETAMINOPHEN 650 MG RE SUPP: 650.0000 mg | Freq: Four times a day (QID) | RECTAL | Status: DC | PRN

## 2015-01-13 MED ORDER — ARTIFICIAL TEARS OP OINT
TOPICAL_OINTMENT | OPHTHALMIC | Status: AC
  Filled 2015-01-13: qty 3.5

## 2015-01-13 MED ORDER — PHENOL 1.4 % MT LIQD
2.0000 | OROMUCOSAL | Status: DC | PRN
  Filled 2015-01-13: qty 177

## 2015-01-13 MED ORDER — MORPHINE SULFATE 2 MG/ML IV SOLN
INTRAVENOUS | Status: DC
Start: 2015-01-13 — End: 2015-01-16
  Administered 2015-01-13: 4 mg via INTRAVENOUS
  Administered 2015-01-13: 2 mg via INTRAVENOUS
  Administered 2015-01-13: 8 mg via INTRAVENOUS
  Administered 2015-01-13: 01:00:00 via INTRAVENOUS
  Administered 2015-01-14: 11 mg via INTRAVENOUS
  Administered 2015-01-14: 6 mg via INTRAVENOUS
  Administered 2015-01-14: 7 mg via INTRAVENOUS
  Administered 2015-01-14: 0 mg via INTRAVENOUS
  Administered 2015-01-14 (×2): 5 mg via INTRAVENOUS
  Administered 2015-01-15: 5.9 mg via INTRAVENOUS
  Administered 2015-01-15: 6 mg via INTRAVENOUS
  Administered 2015-01-15: 9 mg via INTRAVENOUS
  Administered 2015-01-15: 3 mg via INTRAVENOUS
  Administered 2015-01-15: 7 mg via INTRAVENOUS
  Administered 2015-01-16: 9 mg via INTRAVENOUS
  Filled 2015-01-13 (×2): qty 25

## 2015-01-13 MED ORDER — PROMETHAZINE HCL 25 MG/ML IJ SOLN
6.2500 mg | INTRAMUSCULAR | Status: DC | PRN
  Administered 2015-01-13: 6.25 mg via INTRAVENOUS

## 2015-01-13 MED ORDER — ROCURONIUM BROMIDE 100 MG/10ML IV SOLN
INTRAVENOUS | Status: DC | PRN
  Administered 2015-01-12: 25 mg via INTRAVENOUS

## 2015-01-13 MED ORDER — FENTANYL CITRATE (PF) 100 MCG/2ML IJ SOLN
INTRAMUSCULAR | Status: DC | PRN
  Administered 2015-01-12: 100 ug via INTRAVENOUS
  Administered 2015-01-13 (×2): 50 ug via INTRAVENOUS

## 2015-01-13 MED ORDER — SUCCINYLCHOLINE CHLORIDE 20 MG/ML IJ SOLN
INTRAMUSCULAR | Status: DC | PRN
  Administered 2015-01-12: 120 mg via INTRAVENOUS

## 2015-01-13 MED ORDER — SUGAMMADEX SODIUM 500 MG/5ML IV SOLN
INTRAVENOUS | Status: AC
  Filled 2015-01-13: qty 5

## 2015-01-13 MED ORDER — ENOXAPARIN SODIUM 40 MG/0.4ML ~~LOC~~ SOLN
40.0000 mg | SUBCUTANEOUS | Status: DC
  Administered 2015-01-14 – 2015-01-21 (×8): 40 mg via SUBCUTANEOUS
  Filled 2015-01-13 (×8): qty 0.4

## 2015-01-13 MED ORDER — LACTATED RINGERS IV SOLN
INTRAVENOUS | Status: DC | PRN
  Administered 2015-01-12 – 2015-01-13 (×2): via INTRAVENOUS

## 2015-01-13 MED ORDER — PROMETHAZINE HCL 25 MG/ML IJ SOLN
INTRAMUSCULAR | Status: AC
  Administered 2015-01-14: 07:00:00
  Filled 2015-01-13: qty 1

## 2015-01-13 MED ORDER — SODIUM CHLORIDE 0.9 % IJ SOLN
INTRAMUSCULAR | Status: AC
  Filled 2015-01-13: qty 10

## 2015-01-13 MED ORDER — ONDANSETRON HCL 4 MG/2ML IJ SOLN
INTRAMUSCULAR | Status: AC
  Filled 2015-01-13: qty 4

## 2015-01-13 MED ORDER — STERILE WATER FOR INJECTION IJ SOLN
INTRAMUSCULAR | Status: AC
  Filled 2015-01-13: qty 10

## 2015-01-13 MED ORDER — PROPOFOL 10 MG/ML IV BOLUS
INTRAVENOUS | Status: DC | PRN
  Administered 2015-01-12: 150 mg via INTRAVENOUS

## 2015-01-13 MED ORDER — LISINOPRIL-HYDROCHLOROTHIAZIDE 20-12.5 MG PO TABS: 1.0000 | ORAL_TABLET | Freq: Every day | ORAL | Status: DC

## 2015-01-13 MED ORDER — ONDANSETRON 4 MG PO TBDP
4.0000 mg | ORAL_TABLET | Freq: Four times a day (QID) | ORAL | Status: DC | PRN
  Administered 2015-01-17: 4 mg via ORAL
  Filled 2015-01-13: qty 1

## 2015-01-13 MED ORDER — LIDOCAINE HCL (CARDIAC) 20 MG/ML IV SOLN
INTRAVENOUS | Status: DC | PRN
  Administered 2015-01-12: 50 mg via INTRAVENOUS

## 2015-01-13 MED ORDER — DEXAMETHASONE SODIUM PHOSPHATE 4 MG/ML IJ SOLN
INTRAMUSCULAR | Status: DC | PRN
  Administered 2015-01-12: 4 mg via INTRAVENOUS

## 2015-01-13 MED ORDER — ONDANSETRON HCL 4 MG/2ML IJ SOLN: 4.0000 mg | Freq: Four times a day (QID) | INTRAMUSCULAR | Status: DC | PRN

## 2015-01-13 MED ORDER — HYDRALAZINE HCL 20 MG/ML IJ SOLN
10.0000 mg | Freq: Four times a day (QID) | INTRAMUSCULAR | Status: DC | PRN
  Filled 2015-01-13: qty 0.5

## 2015-01-13 MED ORDER — DIPHENHYDRAMINE HCL 50 MG/ML IJ SOLN
12.5000 mg | Freq: Four times a day (QID) | INTRAMUSCULAR | Status: DC | PRN
  Administered 2015-01-17: 12.5 mg via INTRAVENOUS
  Filled 2015-01-13: qty 1

## 2015-01-13 MED ORDER — ONDANSETRON HCL 4 MG/2ML IJ SOLN
INTRAMUSCULAR | Status: DC | PRN
  Administered 2015-01-13: 4 mg via INTRAVENOUS

## 2015-01-13 MED ORDER — LIDOCAINE HCL (CARDIAC) 20 MG/ML IV SOLN
INTRAVENOUS | Status: AC
  Filled 2015-01-13: qty 10

## 2015-01-13 MED ORDER — MORPHINE SULFATE 2 MG/ML IV SOLN
INTRAVENOUS | Status: AC
  Filled 2015-01-13: qty 25

## 2015-01-13 MED ORDER — 0.9 % SODIUM CHLORIDE (POUR BTL) OPTIME
TOPICAL | Status: DC | PRN
  Administered 2015-01-13: 1000 mL

## 2015-01-13 MED ORDER — DIPHENHYDRAMINE HCL 12.5 MG/5ML PO ELIX: 12.5000 mg | ORAL_SOLUTION | Freq: Four times a day (QID) | ORAL | Status: DC | PRN

## 2015-01-13 MED ORDER — LABETALOL HCL 5 MG/ML IV SOLN
INTRAVENOUS | Status: AC
  Filled 2015-01-13: qty 4

## 2015-01-13 MED ORDER — ACETAMINOPHEN 325 MG PO TABS
650.0000 mg | ORAL_TABLET | Freq: Four times a day (QID) | ORAL | Status: DC | PRN
  Administered 2015-01-19 – 2015-01-20 (×2): 650 mg via ORAL
  Filled 2015-01-13 (×2): qty 2

## 2015-01-13 MED ORDER — SODIUM CHLORIDE 0.9 % IV SOLN
INTRAVENOUS | Status: DC
  Administered 2015-01-13 – 2015-01-15 (×4): via INTRAVENOUS

## 2015-01-13 MED ORDER — SUGAMMADEX SODIUM 200 MG/2ML IV SOLN
INTRAVENOUS | Status: DC | PRN
  Administered 2015-01-13: 175 mg via INTRAVENOUS

## 2015-01-13 NOTE — Anesthesia Procedure Notes (Signed)
Procedure Name: Intubation Date/Time: 01/13/2015 11:45 PM Performed by: Hollie Salk Z Pre-anesthesia Checklist: Patient identified, Timeout performed, Emergency Drugs available, Suction available and Patient being monitored Patient Re-evaluated:Patient Re-evaluated prior to inductionOxygen Delivery Method: Circle system utilized Preoxygenation: Pre-oxygenation with 100% oxygen Intubation Type: IV induction, Rapid sequence and Cricoid Pressure applied Laryngoscope Size: Mac and 3 Grade View: Grade II Tube type: Oral Tube size: 7.5 mm Number of attempts: 1 Airway Equipment and Method: Stylet Placement Confirmation: ETT inserted through vocal cords under direct vision,  breath sounds checked- equal and bilateral and positive ETCO2 Secured at: 22 cm Tube secured with: Tape Dental Injury: Teeth and Oropharynx as per pre-operative assessment

## 2015-01-13 NOTE — Progress Notes (Signed)
Received from PACU at this time, A/Ox4, denies pain at this time, oriented to room and surroundings, sons at bedside

## 2015-01-13 NOTE — Op Note (Signed)
Preoperative diagnosis: cecal volvulus, ulcerative colitis Postoperative diagnosis: same as above Procedure:  open right colectomy Surgeon: Dr Serita Grammes Anes: general EBL: 50 cc Specimens: right colon Complications none Drains none Sponge and needle count correct at end dispo to recovery stable  Indications: This is a 59 yof who presents with history of uc but abrupt onset right sided pain. She has ct and exam consistent with cecal volvulus. I discussed elap for open right colectomy.    Procedure: After informed consent was obtained, she was taken to the operating room she was given antibiotics. She had scds in place. She was placed under general anesthesia without complication. She had a foley placed. She was prepped and draped in the standard sterile surgical fashion. A timeout was performed.    I made a midline incision and entered the peritoneum where she was noted to have a very dilated cecum with serosal tears but no perforation. I was able to derotate the volvulus easily.  The remainder of her abdomen was normal. I then released the white line of toldt and brought the colon medial.  The duodenum was visualized.  The mesentery was very long and I was well away from the ureter.  I then divided the TI with a gia stapler. I divided the transverse colon with the gia stapler as well.I then used the ligasure and silk ties to divide the mesentery and this was passed off the table. I then obtained hemostasis.  I then closed the mesenteric defect with silk suture. I brought the small bowel next to the transverse colon. I secured this with silk suture. I then made enterotomies in both. I created an anastomosis with a gia stapler and closed the common enterotomy with a TX stapler. I placed 2 2-0 silk crotch sutures. This was patent. I then placed omentum over this. We then performed the colon protocol with gown/glove change and new instruments. I then closed the abdomen with #1 looped  PDS. the wound was irrigated and all incisions were closed with staples. Sterile dressing placed. She was extubated and then transferred to recovery stable.

## 2015-01-13 NOTE — Progress Notes (Signed)
report6 given to laure reynolds rn as caregiver

## 2015-01-13 NOTE — Transfer of Care (Signed)
Immediate Anesthesia Transfer of Care Note  Patient: Donna [name]  Procedure(s) Performed: Procedure(s): OPEN RIGHT COLECTOMY (Right)  Patient Location: PACU  Anesthesia Type:General  Level of Consciousness: awake, alert , oriented and patient cooperative  Airway & Oxygen Therapy: Patient Spontanous Breathing and Patient connected to nasal cannula oxygen  Post-op Assessment: Report given to RN and Post -op Vital signs reviewed and stable  Post vital signs: Reviewed and stable  Last Vitals:  Filed Vitals:   01/12/15 2018 01/13/15 0105  BP: 166/85 149/65  Pulse:  77  Temp: 36.6 C 36.6 C  Resp:  13    Complications: No apparent anesthesia complications

## 2015-01-13 NOTE — Progress Notes (Signed)
Pt moved to PACU bay 16 for privacy since the pt appears to be staying with [place] for the night due to no beds in the hospital.  Pt's family members at the bedside.  Pt VSS, pt has reduced dose morphine PCA button available and also has a call button for the front desk.  Pt still hooked to monitor, but will be placed on hold.  Will continue to monitor and notify for further changes.

## 2015-01-13 NOTE — Progress Notes (Signed)
Utilization review completed. Alfonso Shackett, RN, BSN. 

## 2015-01-14 LAB — BASIC METABOLIC PANEL
Anion gap: 9 (ref 5–15)
BUN: <5 mg/dL — ABNORMAL LOW (ref 6–20)
CO2: 30 mmol/L (ref 22–32)
Calcium: 9.3 mg/dL (ref 8.9–10.3)
Chloride: 106 mmol/L (ref 101–111)
Creatinine, Ser: 0.75 mg/dL (ref 0.44–1.00)
GFR calc Af Amer: >60 mL/min (ref >60)
GFR calc non Af Amer: >60 mL/min (ref >60)
Glucose, Bld: 119 mg/dL — ABNORMAL HIGH (ref 65–99)
Potassium: 3.4 mmol/L — ABNORMAL LOW (ref 3.5–5.1)
Sodium: 145 mmol/L (ref 135–145)

## 2015-01-14 LAB — CBC
HCT: 36.2 % (ref 36.0–46.0)
Hemoglobin: 11.9 g/dL — ABNORMAL LOW (ref 12.0–15.0)
MCH: 29.6 pg (ref 26.0–34.0)
MCHC: 32.9 g/dL (ref 30.0–36.0)
MCV: 90 fL (ref 78.0–100.0)
Platelets: 204 10*3/uL (ref 150–400)
RBC: 4.02 MIL/uL (ref 3.87–5.11)
RDW: 15 % (ref 11.5–15.5)
WBC: 7.6 10*3/uL (ref 4.0–10.5)

## 2015-01-14 NOTE — Progress Notes (Signed)
[facility] Progress Note  2 Days Post-Op  Subjective: Pt doing pretty well, much less pain than before.  No N/V.  Tolerating clears.  Foley in place.  No flatus or BM.  Feels like she needs to belch or pass gas.  Feels distended.  Objective: Vital signs in last 24 hours: Temp:  [98.6 F (37 C)-100.2 F (37.9 C)] 98.6 F (37 C) (01/13 5366) Pulse Rate:  [80-88] 80 (01/13 0632) Resp:  [10-18] 16 (01/13 0836) BP: (113-149)/(53-94) 149/94 mmHg (01/13 0632) SpO2:  [96 %-100 %] 99 % (01/13 0836) Weight:  [81.64 kg (179 lb 15.7 oz)] 81.64 kg (179 lb 15.7 oz) (01/12 1227) Last BM Date: 01/12/15  Intake/Output from previous day: 01/12 0701 - 01/13 0700 In: 1880 [P.O.:1080; I.V.:800] Out: 3500 [Urine:3500] Intake/Output this shift: Total I/O In: 1119.2 [I.V.:969.2; IV Piggyback:150] Out: -   PE: Gen:  Alert, NAD, pleasant Abd: Soft, distended, tender over incision site, diminished BS, no HSM, incisions with dry dressing over abdomen  Lab Results:   Recent Labs  01/12/15 1650 01/13/15 0540  WBC 7.4 10.2  HGB 13.5 12.7  HCT 39.9 37.7  PLT 223 199   BMET  Recent Labs  01/12/15 1650 01/13/15 0540  NA 142 144  K 4.3 3.8  CL 103 107  CO2 28 30  GLUCOSE 140* 187*  BUN 26* 14  CREATININE 1.24* 1.00  CALCIUM 10.0 9.2   PT/INR No results for input(s): LABPROT, INR in the last 72 hours. CMP     Component Value Date/Time   NA 144 01/13/2015 0540   K 3.8 01/13/2015 0540   CL 107 01/13/2015 0540   CO2 30 01/13/2015 0540   GLUCOSE 187* 01/13/2015 0540   BUN 14 01/13/2015 0540   CREATININE 1.00 01/13/2015 0540   CALCIUM 9.2 01/13/2015 0540   PROT 7.1 01/12/2015 1650   ALBUMIN 3.9 01/12/2015 1650   AST 35 01/12/2015 1650   ALT 28 01/12/2015 1650   ALKPHOS 67 01/12/2015 1650   BILITOT 0.6 01/12/2015 1650   GFRNONAA 57* 01/13/2015 0540   GFRAA >60 01/13/2015 0540   Lipase     Component Value Date/Time   LIPASE 46 01/12/2015 1650        Studies/Results: Ct Abdomen Pelvis W Contrast  01/12/2015  CLINICAL DATA:  70 year old female with sharp right lower quadrant abdominal pain. EXAM: CT ABDOMEN AND PELVIS WITH CONTRAST TECHNIQUE: Multidetector CT imaging of the abdomen and pelvis was performed using the standard protocol following bolus administration of intravenous contrast. CONTRAST:  164m OMNIPAQUE IOHEXOL 300 MG/ML  SOLN COMPARISON:  CT dated 07/18/2010 FINDINGS: Bibasilar dependent atelectatic changes/ scarring. No intra-abdominal free air. There is a small free fluid in the right upper abdomen anterior to the liver adjacent to the cecum. The liver, gallbladder, pancreas, spleen, left adrenal gland appear unremarkable. Stable right adrenal hypodense lesion, incompletely characterized, likely an adenoma. There is no hydronephrosis. Stable small left renal hypodense lesion is not well characterized but likely represents a cyst. The visualized ureters and urinary bladder appear unremarkable. The uterus and ovaries are grossly unremarkable. There is twisting of the proximal transverse colon with axial torsion of the cecum compatible with a cecal volvulus. There is obstruction of the colon at the torsion with distention of the cecum. There is no evidence of small-bowel obstruction. The appendix appears unremarkable. There is no pneumatosis. Mild atherosclerotic calcification of the aorta. The abdominal aorta and IVC appear patent. No portal venous gas identified. There is no  adenopathy. The abdominal wall soft tissues appear unremarkable. There is scoliosis with degenerative changes of the spine. No acute fracture. 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 Clinical correlation and surgical consult is advised. No pneumatosis or portal venous gas identified. Critical Value/emergent results were called by telephone at the time of interpretation on 01/12/2015 at 10:13 pm to Dr  Eulis Foster, who verbally acknowledged these results. Electronically Signed   By: Anner Crete M.D.   On: 01/12/2015 22:17    Anti-infectives: Anti-infectives    Start     Dose/Rate Route Frequency Ordered Stop   01/12/15 2300  cefoTEtan (CEFOTAN) 2 g in dextrose 5 % 50 mL IVPB     2 g 100 mL/hr over 30 Minutes Intravenous  Once 01/12/15 2253 01/12/15 2341   01/12/15 2300  metroNIDAZOLE (FLAGYL) IVPB 500 mg     500 mg 100 mL/hr over 60 Minutes Intravenous  Once 01/12/15 2253 01/12/15 2348       Assessment/Plan [name] POD #1 s/p open right colectomy -d/c foley -Continue clears until bowel function resumes -Continue PCA, plan to d/c tomorrow and start orals -Ambulate and IS -SCD's and lovenox -Await pathology report -Maintain dressing for now  Ulcerative colitis   LOS: 1 day    Nat Christen 01/14/2015, 10:10 AM Pager: [phone]

## 2015-01-14 NOTE — Anesthesia Postprocedure Evaluation (Signed)
Anesthesia Post Note  Patient: Donna [name]  Procedure(s) Performed: Procedure(s) (LRB): OPEN RIGHT COLECTOMY (Right)  Patient location during evaluation: PACU Anesthesia Type: General Level of consciousness: awake Pain management: pain level controlled Vital Signs Assessment: post-procedure vital signs reviewed and stable Cardiovascular status: stable Anesthetic complications: no    Last Vitals:  Filed Vitals:   01/14/15 0632 01/14/15 0836  BP: 149/94   Pulse: 80   Temp: 37 C   Resp: 17 16    Last Pain:  Filed Vitals:   01/14/15 0837  PainSc: 3                  EDWARDS,Halley Kincer

## 2015-01-15 MED ORDER — SIMETHICONE 80 MG PO CHEW
80.0000 mg | CHEWABLE_TABLET | Freq: Four times a day (QID) | ORAL | Status: DC | PRN
  Administered 2015-01-15 – 2015-01-21 (×4): 80 mg via ORAL
  Filled 2015-01-15 (×4): qty 1

## 2015-01-15 MED ORDER — KCL IN DEXTROSE-NACL 20-5-0.2 MEQ/L-%-% IV SOLN
INTRAVENOUS | Status: DC
  Administered 2015-01-15 – 2015-01-16 (×2): via INTRAVENOUS
  Administered 2015-01-16: 1 mL via INTRAVENOUS
  Administered 2015-01-17 – 2015-01-20 (×6): via INTRAVENOUS
  Filled 2015-01-15 (×14): qty 1000

## 2015-01-15 NOTE — Progress Notes (Signed)
MD paged as pt is c/o bloating

## 2015-01-15 NOTE — Progress Notes (Signed)
Telephone order for gas x given by MD

## 2015-01-15 NOTE — Progress Notes (Signed)
Pt ambulated a third of a lap on unit. Oxygen saturations dropped to the low 80s while ambulating. Currently sitting out in chair

## 2015-01-15 NOTE — Progress Notes (Signed)
Patient ID: Donna [name], female   DOB: 1945-07-02, 70 y.o.   MRN: [id number]  [facility]  POD#: 2  Subjective: Patient in bed, family at bedside.  Complains of distension.  Taking clear liquids.  Has not ambulated.  Objective: Vital signs in last 24 hours: Temp:  [98.4 F (36.9 C)-99.8 F (37.7 C)] 98.4 F (36.9 C) (01/14 1006) Pulse Rate:  [84-92] 84 (01/14 1006) Resp:  [14-18] 16 (01/14 1006) BP: (141-167)/(66-77) 142/72 mmHg (01/14 1006) SpO2:  [92 %-100 %] 94 % (01/14 1006) Last BM Date: 01/12/15  Intake/Output from previous day: 01/13 0701 - 01/14 0700 In: 2974.2 [P.O.:1040; I.V.:1784.2; IV Piggyback:150] Out: 2700 [Urine:2700] Intake/Output this shift: Total I/O In: -  Out: 700 [Urine:700]  Physical Exam: HEENT - sclerae clear, mucous membranes moist Neck - soft Chest - clear bilaterally Cor - RRR Abdomen - distended, quiet; dressings dry and intact Ext - no edema, non-tender Neuro - alert & oriented, no focal deficits  Lab Results:   Recent Labs  01/13/15 0540 01/14/15 1120  WBC 10.2 7.6  HGB 12.7 11.9*  HCT 37.7 36.2  PLT 199 204   BMET  Recent Labs  01/13/15 0540 01/14/15 1120  NA 144 145  K 3.8 3.4*  CL 107 106  CO2 30 30  GLUCOSE 187* 119*  BUN 14 <5*  CREATININE 1.00 0.75  CALCIUM 9.2 9.3   PT/INR No results for input(s): LABPROT, INR in the last 72 hours. Comprehensive Metabolic Panel:    Component Value Date/Time   NA 145 01/14/2015 1120   NA 144 01/13/2015 0540   K 3.4* 01/14/2015 1120   K 3.8 01/13/2015 0540   CL 106 01/14/2015 1120   CL 107 01/13/2015 0540   CO2 30 01/14/2015 1120   CO2 30 01/13/2015 0540   BUN <5* 01/14/2015 1120   BUN 14 01/13/2015 0540   CREATININE 0.75 01/14/2015 1120   CREATININE 1.00 01/13/2015 0540   GLUCOSE 119* 01/14/2015 1120   GLUCOSE 187* 01/13/2015 0540   CALCIUM 9.3 01/14/2015 1120   CALCIUM 9.2 01/13/2015 0540   AST 35 01/12/2015 1650   ALT 28  01/12/2015 1650   ALKPHOS 67 01/12/2015 1650   BILITOT 0.6 01/12/2015 1650   PROT 7.1 01/12/2015 1650   ALBUMIN 3.9 01/12/2015 1650    Studies/Results: No results found.  Anti-infectives: Anti-infectives    Start     Dose/Rate Route Frequency Ordered Stop   01/12/15 2300  cefoTEtan (CEFOTAN) 2 g in dextrose 5 % 50 mL IVPB     2 g 100 mL/hr over 30 Minutes Intravenous  Once 01/12/15 2253 01/12/15 2341   01/12/15 2300  metroNIDAZOLE (FLAGYL) IVPB 500 mg     500 mg 100 mL/hr over 60 Minutes Intravenous  Once 01/12/15 2253 01/12/15 2348      Assessment & Plans: Status post right colectomy for cecal volvulus  Post op ileus  Stay on limited clear liquids  Encouraged to ambulate, up to chair  IV hydration  Pain control  Earnstine Regal, MD, [facility] Office: [place] 01/15/2015

## 2015-01-16 MED ORDER — MORPHINE SULFATE (PF) 2 MG/ML IV SOLN
2.0000 mg | INTRAVENOUS | Status: DC | PRN
  Administered 2015-01-16 – 2015-01-17 (×2): 2 mg via INTRAVENOUS
  Administered 2015-01-17 (×2): 4 mg via INTRAVENOUS
  Administered 2015-01-17: 2 mg via INTRAVENOUS
  Administered 2015-01-17 – 2015-01-18 (×2): 4 mg via INTRAVENOUS
  Administered 2015-01-18: 2 mg via INTRAVENOUS
  Administered 2015-01-18: 4 mg via INTRAVENOUS
  Administered 2015-01-19: 2 mg via INTRAVENOUS
  Filled 2015-01-16: qty 1
  Filled 2015-01-16: qty 2
  Filled 2015-01-16 (×3): qty 1
  Filled 2015-01-16 (×4): qty 2
  Filled 2015-01-16: qty 1

## 2015-01-16 MED ORDER — OXYCODONE HCL 5 MG PO TABS
10.0000 mg | ORAL_TABLET | ORAL | Status: DC | PRN
  Administered 2015-01-16 – 2015-01-19 (×10): 10 mg via ORAL
  Filled 2015-01-16 (×10): qty 2

## 2015-01-16 NOTE — Progress Notes (Signed)
Pt vomitted this pm. Prn iv zofran and 40m morphine administered for pain. Pt's son said he gave his mom a tums pill. Emesis was brown/pink colored. Pt and son advised not to self administer any medicines. Will ambulate with pt this pm when she feels better

## 2015-01-16 NOTE — Progress Notes (Signed)
2m po oxy ir administered for c/o pain

## 2015-01-16 NOTE — Progress Notes (Signed)
Pt ambulated a quarter lap on unit. Tolerated walk well with no distress noted

## 2015-01-16 NOTE — Progress Notes (Signed)
  Progress Note: General Surgery Service   Subjective: Tolerated liquids well overnight. Up to chair and walked int he hallway. Flatus this am. Pushed button 2x in last 4h  Objective: Vital signs in last 24 hours: Temp:  [98.2 F (36.8 C)-99.1 F (37.3 C)] 98.9 F (37.2 C) (01/15 0952) Pulse Rate:  [77-93] 84 (01/15 0952) Resp:  [13-19] 16 (01/15 0952) BP: (142-155)/(64-85) 142/84 mmHg (01/15 0952) SpO2:  [93 %-99 %] 98 % (01/15 0952) Last BM Date: 01/12/15  Intake/Output from previous day: 01/14 0701 - 01/15 0700 In: 2552.5 [P.O.:1200; I.V.:1352.5] Out: 2700 [Urine:2700] Intake/Output this shift:    Lungs: CTAB  Cardiovascular: RRR  Abd: soft, ATTP, ND, wound c/d/i no erythema  Extremities: no edema  Neuro: AOx4  Lab Results: CBC   Recent Labs  01/14/15 1120  WBC 7.6  HGB 11.9*  HCT 36.2  PLT 204   BMET  Recent Labs  01/14/15 1120  NA 145  K 3.4*  CL 106  CO2 30  GLUCOSE 119*  BUN <5*  CREATININE 0.75  CALCIUM 9.3   PT/INR No results for input(s): LABPROT, INR in the last 72 hours. ABG No results for input(s): PHART, HCO3 in the last 72 hours.  Invalid input(s): PCO2, PO2  Studies/Results:  Anti-infectives: Anti-infectives    Start     Dose/Rate Route Frequency Ordered Stop   01/12/15 2300  cefoTEtan (CEFOTAN) 2 g in dextrose 5 % 50 mL IVPB     2 g 100 mL/hr over 30 Minutes Intravenous  Once 01/12/15 2253 01/12/15 2341   01/12/15 2300  metroNIDAZOLE (FLAGYL) IVPB 500 mg     500 mg 100 mL/hr over 60 Minutes Intravenous  Once 01/12/15 2253 01/12/15 2348      Medications: Scheduled Meds: . enoxaparin (LOVENOX) injection  40 mg Subcutaneous Q24H  . lisinopril  20 mg Oral Daily   And  . hydrochlorothiazide  12.5 mg Oral Daily  . morphine   Intravenous 6 times per day  . pantoprazole  40 mg Oral BID   Continuous Infusions: . dextrose 5 % and 0.2 % NaCl with KCl 20 mEq 1 mL (01/16/15 0005)   PRN Meds:.acetaminophen **OR**  acetaminophen, diphenhydrAMINE **OR** diphenhydrAMINE, hydrALAZINE, naloxone **AND** sodium chloride, ondansetron **OR** ondansetron (ZOFRAN) IV, phenol, simethicone  Assessment/Plan: Patient Active Problem List   Diagnosis Date Noted  . [name] ([place]) 01/13/2015  . Bilateral carpal tunnel syndrome 01/05/2015   s/p Procedure(s): OPEN RIGHT COLECTOMY 01/12/2015 POD 4 right hemi. Doing well Advance diet Dc PCA Oral and intermittent IV pain meds Continue gas x   LOS: 3 days   Mickeal Skinner, MD Pg# [phone] [facility]

## 2015-01-16 NOTE — Progress Notes (Signed)
24m oxy ir administered for c/o pain

## 2015-01-16 NOTE — Progress Notes (Signed)
PCA pump discontinued this morning by MD

## 2015-01-17 ENCOUNTER — Inpatient Hospital Stay (HOSPITAL_COMMUNITY): Payer: BLUE CROSS/BLUE SHIELD

## 2015-01-17 MED ORDER — MAGNESIUM HYDROXIDE 400 MG/5ML PO SUSP: 30.0000 mL | Freq: Every day | ORAL | Status: DC | PRN

## 2015-01-17 NOTE — Progress Notes (Signed)
Patient ID: Donna [name], female   DOB: 1945-08-05, 70 y.o.   MRN: [id number] 5 Days Post-Op  Subjective: Pt nauseated this morning.  Having increase in reflux.  No flatus yet, but belching a lot  Objective: Vital signs in last 24 hours: Temp:  [97.3 F (36.3 C)-99.4 F (37.4 C)] 99.4 F (37.4 C) (01/16 0459) Pulse Rate:  [82-93] 93 (01/16 0459) Resp:  [16-18] 18 (01/16 0459) BP: (131-148)/(49-87) 131/84 mmHg (01/16 0459) SpO2:  [95 %-99 %] 96 % (01/16 0459) Last BM Date: 01/12/15  Intake/Output from previous day: 01/15 0701 - 01/16 0700 In: 2432.5 [P.O.:720; I.V.:1712.5] Out: 1100 [Urine:1000; Emesis/NG output:100] Intake/Output this shift:    PE: Abd: soft, some distention, +BS, tender appropriately, midline incision is c/d/i with staples present  Lab Results:   Recent Labs  01/14/15 1120  WBC 7.6  HGB 11.9*  HCT 36.2  PLT 204   BMET  Recent Labs  01/14/15 1120  NA 145  K 3.4*  CL 106  CO2 30  GLUCOSE 119*  BUN <5*  CREATININE 0.75  CALCIUM 9.3   PT/INR No results for input(s): LABPROT, INR in the last 72 hours. CMP     Component Value Date/Time   NA 145 01/14/2015 1120   K 3.4* 01/14/2015 1120   CL 106 01/14/2015 1120   CO2 30 01/14/2015 1120   GLUCOSE 119* 01/14/2015 1120   BUN <5* 01/14/2015 1120   CREATININE 0.75 01/14/2015 1120   CALCIUM 9.3 01/14/2015 1120   PROT 7.1 01/12/2015 1650   ALBUMIN 3.9 01/12/2015 1650   AST 35 01/12/2015 1650   ALT 28 01/12/2015 1650   ALKPHOS 67 01/12/2015 1650   BILITOT 0.6 01/12/2015 1650   GFRNONAA >60 01/14/2015 1120   GFRAA >60 01/14/2015 1120   Lipase     Component Value Date/Time   LIPASE 46 01/12/2015 1650       Studies/Results: No results found.  Anti-infectives: Anti-infectives    Start     Dose/Rate Route Frequency Ordered Stop   01/12/15 2300  cefoTEtan (CEFOTAN) 2 g in dextrose 5 % 50 mL IVPB     2 g 100 mL/hr over 30 Minutes Intravenous  Once 01/12/15 2253 01/12/15 2341   01/12/15 2300  metroNIDAZOLE (FLAGYL) IVPB 500 mg     500 mg 100 mL/hr over 60 Minutes Intravenous  Once 01/12/15 2253 01/12/15 2348       Assessment/Plan  POD 4, s/p ex lap with right hemicolectomy for cecal volvulus -decrease to full liquids due to nausea.  She does have good bowel sounds though, so suspect she will have better bowel activity soon. -cont to mobilize/pulm toilet -oral pain meds -scds/lovenox   LOS: 4 days    Daley Gosse E 01/17/2015, 9:29 AM Pager: [phone]

## 2015-01-17 NOTE — Progress Notes (Signed)
Patient vomited 100 cc yellow liquid this am. Pt was administered 37m Zofran and she ambulated in the hallway around the entire unit. Abdomen soft, bowel sounds hypoactive.  Will continue to monitor.

## 2015-01-18 LAB — BASIC METABOLIC PANEL
Anion gap: 10 (ref 5–15)
BUN: <5 mg/dL — ABNORMAL LOW (ref 6–20)
CO2: 30 mmol/L (ref 22–32)
Calcium: 9.3 mg/dL (ref 8.9–10.3)
Chloride: 99 mmol/L — ABNORMAL LOW (ref 101–111)
Creatinine, Ser: 0.72 mg/dL (ref 0.44–1.00)
GFR calc Af Amer: >60 mL/min (ref >60)
GFR calc non Af Amer: >60 mL/min (ref >60)
Glucose, Bld: 141 mg/dL — ABNORMAL HIGH (ref 65–99)
Potassium: 3.6 mmol/L (ref 3.5–5.1)
Sodium: 139 mmol/L (ref 135–145)

## 2015-01-18 LAB — MAGNESIUM: Magnesium: 1.9 mg/dL (ref 1.7–2.4)

## 2015-01-18 NOTE — Care Management Note (Signed)
Case Management Note  Patient Details  Name: Donna [name] MRN: [id number] Date of Birth: 05-07-1945  Subjective/Objective:                    Action/Plan:   Expected Discharge Date:                  Expected Discharge Plan:  Home/Self Care  In-House Referral:     Discharge planning Services     Post Acute Care Choice:    Choice offered to:     DME Arranged:    DME Agency:     HH Arranged:    [place] Agency:     Status of Service:     Medicare Important Message Given:    Date Medicare IM Given:    Medicare IM give by:    Date Additional Medicare IM Given:    Additional Medicare Important Message give by:     If discussed at [place] of Stay Meetings, dates discussed:    Additional Comments:  Marilu Favre, RN 01/18/2015, 11:09 AM

## 2015-01-18 NOTE — Progress Notes (Signed)
56m po oxy ir administered for c/o abdominal pain

## 2015-01-18 NOTE — Progress Notes (Signed)
48m oxy ir administered for pain

## 2015-01-18 NOTE — Progress Notes (Signed)
Patient ID: Donna [name], female   DOB: [birth year], 70 y.o.   MRN: [id number] 6 Days Post-Op  Subjective: Pt still feels full after eating minimal liquids.  Did start passing more flatus this am.  Ambulating well.  Objective: Vital signs in last 24 hours: Temp:  [98.8 F (37.1 C)-99.5 F (37.5 C)] 98.8 F (37.1 C) (01/17 0528) Pulse Rate:  [90-94] 90 (01/17 0528) Resp:  [18] 18 (01/17 0528) BP: (134-136)/(61-71) 134/71 mmHg (01/17 0528) SpO2:  [93 %-96 %] 94 % (01/17 0528) Last BM Date: 01/12/15  Intake/Output from previous day: 01/16 0701 - 01/17 0700 In: 2542 [P.O.:642; I.V.:1900] Out: 1550 [Urine:1550] Intake/Output this shift:    PE: Abd: soft, still with mild bloating, +BS, appropriately tender, midline incision is c/d/i with staples  Lab Results:  No results for input(s): WBC, HGB, HCT, PLT in the last 72 hours. BMET  Recent Labs  01/18/15 0508  NA 139  K 3.6  CL 99*  CO2 30  GLUCOSE 141*  BUN <5*  CREATININE 0.72  CALCIUM 9.3   PT/INR No results for input(s): LABPROT, INR in the last 72 hours. CMP     Component Value Date/Time   NA 139 01/18/2015 0508   K 3.6 01/18/2015 0508   CL 99* 01/18/2015 0508   CO2 30 01/18/2015 0508   GLUCOSE 141* 01/18/2015 0508   BUN <5* 01/18/2015 0508   CREATININE 0.72 01/18/2015 0508   CALCIUM 9.3 01/18/2015 0508   PROT 7.1 01/12/2015 1650   ALBUMIN 3.9 01/12/2015 1650   AST 35 01/12/2015 1650   ALT 28 01/12/2015 1650   ALKPHOS 67 01/12/2015 1650   BILITOT 0.6 01/12/2015 1650   GFRNONAA >60 01/18/2015 0508   GFRAA >60 01/18/2015 0508   Lipase     Component Value Date/Time   LIPASE 46 01/12/2015 1650       Studies/Results: Dg Abd Portable 1v  01/17/2015  CLINICAL DATA:  Ileus.  Abdominal pain.  Nausea. EXAM: PORTABLE ABDOMEN - 1 VIEW COMPARISON:  01/12/2015 CT FINDINGS: Gas throughout nondistended large and small bowel. No free air. No organomegaly or suspicious calcification. Severe leftward scoliosis in  the thoracolumbar spine. IMPRESSION: No evidence of bowel obstruction or free air. Electronically Signed   By: Rolm Baptise M.D.   On: 01/17/2015 11:22    Anti-infectives: Anti-infectives    Start     Dose/Rate Route Frequency Ordered Stop   01/12/15 2300  cefoTEtan (CEFOTAN) 2 g in dextrose 5 % 50 mL IVPB     2 g 100 mL/hr over 30 Minutes Intravenous  Once 01/12/15 2253 01/12/15 2341   01/12/15 2300  metroNIDAZOLE (FLAGYL) IVPB 500 mg     500 mg 100 mL/hr over 60 Minutes Intravenous  Once 01/12/15 2253 01/12/15 2348       Assessment/Plan POD 5, s/p ex lap with right hemicolectomy for cecal volvulus -cont clear liquid diet for now.  Hopefully over the next 24 hours she will start to have better bowel function and her diet can be advanced again -cont to mobilize/pulm toilet -oral pain meds -scds/lovenox     LOS: 5 days    Diontae Route E 01/18/2015, 10:06 AM Pager: [phone]

## 2015-01-19 MED ORDER — OXYCODONE HCL 5 MG PO TABS
5.0000 mg | ORAL_TABLET | ORAL | Status: DC | PRN
  Administered 2015-01-19 – 2015-01-21 (×10): 10 mg via ORAL
  Filled 2015-01-19 (×10): qty 2

## 2015-01-19 NOTE — Progress Notes (Signed)
7 Days Post-Op  Subjective: She looks good, had some nausea with clears, some emesis, and bloating with clears yesterday.  She is taking some now.  She is walking, and doing IS.  Objective: Vital signs in last 24 hours: Temp:  [98.7 F (37.1 C)-100 F (37.8 C)] 98.7 F (37.1 C) (01/18 0513) Pulse Rate:  [84-86] 84 (01/18 0513) Resp:  [16-18] 16 (01/18 0513) BP: (120-137)/(53-75) 137/53 mmHg (01/18 0513) SpO2:  [95 %-97 %] 96 % (01/18 0513) Last BM Date: 01/12/15 PO 1020  Clear diet 2400 urine BM x 2 yesterday TM 100, afebrile after that, VSS BMP OK , glucose up some,  WBC normal 01/14/15 No films Intake/Output from previous day: 01/17 0701 - 01/18 0700 In: 2170 [P.O.:1020; I.V.:1150] Out: 2400 [Urine:2400] Intake/Output this shift:    General appearance: alert, cooperative and no distress Resp: few rales in the base, otherwise clear. GI: soft, very sore, incision looks fine. + BS and + BM yesterday.    Lab Results:  No results for input(s): WBC, HGB, HCT, PLT in the last 72 hours.  BMET  Recent Labs  01/18/15 0508  NA 139  K 3.6  CL 99*  CO2 30  GLUCOSE 141*  BUN <5*  CREATININE 0.72  CALCIUM 9.3   PT/INR No results for input(s): LABPROT, INR in the last 72 hours.   Recent Labs Lab 01/12/15 1650  AST 35  ALT 28  ALKPHOS 67  BILITOT 0.6  PROT 7.1  ALBUMIN 3.9     Lipase     Component Value Date/Time   LIPASE 46 01/12/2015 1650     Studies/Results: Dg Abd Portable 1v  01/17/2015  CLINICAL DATA:  Ileus.  Abdominal pain.  Nausea. EXAM: PORTABLE ABDOMEN - 1 VIEW COMPARISON:  01/12/2015 CT FINDINGS: Gas throughout nondistended large and small bowel. No free air. No organomegaly or suspicious calcification. Severe leftward scoliosis in the thoracolumbar spine. IMPRESSION: No evidence of bowel obstruction or free air. Electronically Signed   By: Rolm Baptise M.D.   On: 01/17/2015 11:22    Medications: . enoxaparin (LOVENOX) injection  40 mg  Subcutaneous Q24H  . lisinopril  20 mg Oral Daily   And  . hydrochlorothiazide  12.5 mg Oral Daily  . pantoprazole  40 mg Oral BID   . dextrose 5 % and 0.2 % NaCl with KCl 20 mEq 75 mL/hr at 01/18/15 1939   Prior to Admission medications   Medication Sig Start Date End Date Taking? Authorizing Provider  Calcium Carbonate-Vitamin D (CALCIUM-VITAMIN D3 PO) Take 1 tablet by mouth 2 (two) times daily.   Yes Historical Provider, MD  Calcium Citrate-Vitamin D (CALCIUM + D PO) Take 1 tablet by mouth 2 (two) times daily.   Yes Historical Provider, MD  HYDROcodone-acetaminophen (NORCO/VICODIN) 5-325 MG tablet Take 1 tablet by mouth every 6 (six) hours as needed for moderate pain.   Yes Historical Provider, MD  lisinopril-hydrochlorothiazide (PRINZIDE,ZESTORETIC) 20-12.5 MG tablet Take 1 tablet by mouth daily.   Yes Historical Provider, MD  Multiple Vitamin (MULTIVITAMIN WITH MINERALS) TABS tablet Take 1 tablet by mouth daily.   Yes Historical Provider, MD  nabumetone (RELAFEN) 750 MG tablet Take 750 mg by mouth 2 (two) times daily.   Yes Historical Provider, MD  Omega-3 Fatty Acids (FISH OIL) 1000 MG CAPS Take 1 capsule by mouth 2 (two) times daily.   Yes Historical Provider, MD  pantoprazole (PROTONIX) 40 MG tablet Take 40 mg by mouth 2 (two) times daily.  Yes Historical Provider, MD  Red Yeast Rice Extract (RED YEAST RICE PO) Take 1 tablet by mouth 2 (two) times daily.   Yes Historical Provider, MD  cephALEXin (KEFLEX) 500 MG capsule Take 1 capsule (500 mg total) by mouth 4 (four) times daily. Patient not taking: Reported on 01/12/2015 09/19/14   April Palumbo, MD  meloxicam (MOBIC) 7.5 MG tablet Take 1 tablet (7.5 mg total) by mouth daily. Patient not taking: Reported on 01/12/2015 09/19/14   April Palumbo, MD  omeprazole (PRILOSEC) 20 MG capsule Take 1 capsule (20 mg total) by mouth daily. Patient not taking: Reported on 01/12/2015 09/19/14   April Palumbo, MD   Diagnosis Colon, segmental resection,  right colon volvulus - DILATED COLON WITH FOCAL MUCOSAL HEMORRHAGIC NECROSIS CONSISTENT WITH VOLVULUS. - FOUR BENIGN LYMPH NODES (0/4). - NO EVIDENCE OF MALIGNANCY Assessment/Plan:  [name], ulcerative colitis S/p Open right colectomy 01/13/15, dr. Rolm Bookbinder Hypertension GERD Lumbar disc disease Hyperlipidemia Antibiotics:  Pre op only DVT: Lovenox/SCD  Plan:  She ask about the Path and I gave her a copy of path report and explained it to her.  i am going to order Full liquids and told her to advance and order as she liked.  Continue to ambulate and be up to chair as much as possible.  I will leave her fluids where they are till she is taking diet without issue.      LOS: 6 days    Donna [name] 01/19/2015

## 2015-01-20 LAB — CREATININE, SERUM
Creatinine, Ser: 0.72 mg/dL (ref 0.44–1.00)
GFR calc Af Amer: >60 mL/min (ref >60)
GFR calc non Af Amer: >60 mL/min (ref >60)

## 2015-01-20 LAB — CBC
HCT: 33.7 % — ABNORMAL LOW (ref 36.0–46.0)
Hemoglobin: 11.6 g/dL — ABNORMAL LOW (ref 12.0–15.0)
MCH: 29.8 pg (ref 26.0–34.0)
MCHC: 34.4 g/dL (ref 30.0–36.0)
MCV: 86.6 fL (ref 78.0–100.0)
Platelets: 285 10*3/uL (ref 150–400)
RBC: 3.89 MIL/uL (ref 3.87–5.11)
RDW: 13.6 % (ref 11.5–15.5)
WBC: 4.9 10*3/uL (ref 4.0–10.5)

## 2015-01-20 LAB — COMPREHENSIVE METABOLIC PANEL
ALT: 37 U/L (ref 14–54)
AST: 36 U/L (ref 15–41)
Albumin: 2.9 g/dL — ABNORMAL LOW (ref 3.5–5.0)
Alkaline Phosphatase: 108 U/L (ref 38–126)
Anion gap: 9 (ref 5–15)
BUN: <5 mg/dL — ABNORMAL LOW (ref 6–20)
CO2: 31 mmol/L (ref 22–32)
Calcium: 9.2 mg/dL (ref 8.9–10.3)
Chloride: 101 mmol/L (ref 101–111)
Creatinine, Ser: 0.74 mg/dL (ref 0.44–1.00)
GFR calc Af Amer: >60 mL/min (ref >60)
GFR calc non Af Amer: >60 mL/min (ref >60)
Glucose, Bld: 134 mg/dL — ABNORMAL HIGH (ref 65–99)
Potassium: 3 mmol/L — ABNORMAL LOW (ref 3.5–5.1)
Sodium: 141 mmol/L (ref 135–145)
Total Bilirubin: 0.8 mg/dL (ref 0.3–1.2)
Total Protein: 6.3 g/dL — ABNORMAL LOW (ref 6.5–8.1)

## 2015-01-20 MED ORDER — IBUPROFEN 600 MG PO TABS
600.0000 mg | ORAL_TABLET | Freq: Four times a day (QID) | ORAL | Status: DC | PRN
  Administered 2015-01-21: 600 mg via ORAL
  Filled 2015-01-20: qty 1

## 2015-01-20 MED ORDER — POTASSIUM CHLORIDE CRYS ER 20 MEQ PO TBCR
20.0000 meq | EXTENDED_RELEASE_TABLET | Freq: Two times a day (BID) | ORAL | Status: DC
  Administered 2015-01-20 – 2015-01-21 (×3): 20 meq via ORAL
  Filled 2015-01-20 (×3): qty 1

## 2015-01-20 MED ORDER — METHOCARBAMOL 500 MG PO TABS
500.0000 mg | ORAL_TABLET | Freq: Four times a day (QID) | ORAL | Status: DC | PRN
  Administered 2015-01-21: 500 mg via ORAL
  Filled 2015-01-20: qty 1

## 2015-01-20 NOTE — Care Management Note (Signed)
Case Management Note  Patient Details  Name: MICHELL GIULIANO MRN: [id number] Date of Birth: 04/16/1945  Subjective/Objective:                    Action/Plan:  UR updated  Expected Discharge Date:                  Expected Discharge Plan:  Home/Self Care  In-House Referral:     Discharge planning Services     Post Acute Care Choice:    Choice offered to:     DME Arranged:    DME Agency:     HH Arranged:    [place] Agency:     Status of Service:  In process, will continue to follow  Medicare Important Message Given:    Date Medicare IM Given:    Medicare IM give by:    Date Additional Medicare IM Given:    Additional Medicare Important Message give by:     If discussed at [place] of Stay Meetings, dates discussed:  01-20-15  Additional Comments:  Marilu Favre, RN 01/20/2015, 1:29 PM

## 2015-01-20 NOTE — Progress Notes (Signed)
8 Days Post-Op  Subjective: She looks fine, she is still tender, says liquids "tear her stomach up."  She has soup last PM and then had to go to [place].  Nausea is better.  She had some white drainage from her lower staple so I took it out, but this all seems to be clean, nothing deeper.   Objective: Vital signs in last 24 hours: Temp:  [98.7 F (37.1 C)-99 F (37.2 C)] 99 F (37.2 C) (01/19 0519) Pulse Rate:  [81-87] 81 (01/19 0519) Resp:  [16-18] 18 (01/19 0519) BP: (123-134)/(62-74) 134/74 mmHg (01/19 0519) SpO2:  [96 %-100 %] 100 % (01/19 0519) Last BM Date: 01/19/15 1720 PO recorded 2575 urine BM x 2 Afebrile, VSS Creatinine is normal, I have added CMP and CBC No films Intake/Output from previous day: 01/18 0701 - 01/19 0700 In: 15022.5 [P.O.:1720; I.V.:13302.5] Out: 2575 [Urine:2575] Intake/Output this shift:    General appearance: alert, cooperative and no distress Resp: clear to auscultation bilaterally GI: still some tenderness with palpation, rather generalized, minimal distension, few BS, +BM x 2.  Lab Results:  No results for input(s): WBC, HGB, HCT, PLT in the last 72 hours.  BMET  Recent Labs  01/18/15 0508 01/20/15 0526  NA 139  --   K 3.6  --   CL 99*  --   CO2 30  --   GLUCOSE 141*  --   BUN <5*  --   CREATININE 0.72 0.72  CALCIUM 9.3  --    PT/INR No results for input(s): LABPROT, INR in the last 72 hours.  No results for input(s): AST, ALT, ALKPHOS, BILITOT, PROT, ALBUMIN in the last 168 hours.   Lipase     Component Value Date/Time   LIPASE 46 01/12/2015 1650     Studies/Results: No results found.  Medications: . enoxaparin (LOVENOX) injection  40 mg Subcutaneous Q24H  . lisinopril  20 mg Oral Daily   And  . hydrochlorothiazide  12.5 mg Oral Daily  . pantoprazole  40 mg Oral BID    Assessment/Plan [name], ulcerative colitis S/p Open right colectomy 01/13/15, dr. Rolm Bookbinder Hypertension GERD Lumbar disc  disease Hyperlipidemia Antibiotics: Pre op only DVT: Lovenox/SCD   Plan:  I am check labs on her this AM, Wound is OK, I took out lower staple and site is fine. I will leave her on full liquids for now.  She feels bad and is then afraid to eat, but I/O shows a pretty good intake.   Continue to follow and go slow.    LOS: 7 days    Honor Frison 01/20/2015

## 2015-01-21 ENCOUNTER — Encounter (HOSPITAL_COMMUNITY): Payer: Self-pay | Admitting: General Surgery

## 2015-01-21 DIAGNOSIS — K219 Gastro-esophageal reflux disease without esophagitis: Secondary | ICD-10-CM

## 2015-01-21 DIAGNOSIS — I10 Essential (primary) hypertension: Secondary | ICD-10-CM | POA: Diagnosis present

## 2015-01-21 DIAGNOSIS — M5137 Other intervertebral disc degeneration, lumbosacral region: Secondary | ICD-10-CM

## 2015-01-21 DIAGNOSIS — E785 Hyperlipidemia, unspecified: Secondary | ICD-10-CM

## 2015-01-21 DIAGNOSIS — M51379 Other intervertebral disc degeneration, lumbosacral region without mention of lumbar back pain or lower extremity pain: Secondary | ICD-10-CM

## 2015-01-21 HISTORY — DX: Essential (primary) hypertension: I10

## 2015-01-21 HISTORY — DX: Other intervertebral disc degeneration, lumbosacral region without mention of lumbar back pain or lower extremity pain: M51.379

## 2015-01-21 HISTORY — DX: Hyperlipidemia, unspecified: E78.5

## 2015-01-21 HISTORY — DX: Gastro-esophageal reflux disease without esophagitis: K21.9

## 2015-01-21 HISTORY — DX: Other intervertebral disc degeneration, lumbosacral region: M51.37

## 2015-01-21 MED ORDER — ACETAMINOPHEN 325 MG PO TABS: 650.0000 mg | ORAL_TABLET | Freq: Four times a day (QID) | ORAL | Status: DC | PRN

## 2015-01-21 MED ORDER — POTASSIUM CHLORIDE CRYS ER 20 MEQ PO TBCR: EXTENDED_RELEASE_TABLET | ORAL | Status: DC

## 2015-01-21 MED ORDER — OXYCODONE HCL 5 MG PO TABS: 5.0000 mg | ORAL_TABLET | ORAL | Status: DC | PRN

## 2015-01-21 NOTE — Discharge Summary (Signed)
Physician Discharge Summary  Patient ID: Donna [name] MRN: [id number] DOB/AGE: 1945-03-02 70 y.o.  Admit date: 01/12/2015 Discharge date: 01/21/2015  Admission Diagnoses:  [name], ulcerative colitis S/p Open right colectomy 01/13/15, dr. Rolm Bookbinder Hypertension GERD Lumbar disc disease Hyperlipidemia Discharge Diagnoses:  Same  Principal Problem:   [name] ([place]) Active Problems:   Essential hypertension   GERD (gastroesophageal reflux disease)   Degeneration of lumbar or lumbosacral intervertebral disc   Hyperlipidemia   PROCEDURES: Open Right Colectomy, 01/13/15, Dr. Berneice Gandy Course:  73 yof with history of uc cared for by Dr Cristina Gong, gets annual csc that she says is normal, treated with apreso and doing well. She has one day history of increasing abdominal pain, inability to have flatus or stool, some nausea no emesis. Pain becoming unbearable nothing helping. Underwent ct scan that shows distended right colon with cecal volvulus.  She was seen in the ED by Dr. Donne Hazel and taken to the OR that  Evening.  Post op she had some ileus and was slow to open up.  She had easy filling, trouble with nausea and bloating.  Her PO intake was very gradual.  She slowly improved labs were normal and by 01/21/15 she was ready for discharge.  i took out one staple at the base on 01/20/15 because there was some purulent looking fluid on the staple, the wound was examined and nothing found.  The remaining sutures were left in place and will come out in the office on 01/27/15.  She will follow up with Dr. Donne Hazel in 2 weeks.  She had some low potassium's, and we put her on K+ supplement, which I will continue for the next 15 days.  She can follow up with PCP for ongoing K+ supplements.  She feels much better today, tolerating soft diet, no better, + BM yesterday, no nausea or vomiting , bloating this AM.     Condition on d/c:  improved  CBC Latest Ref Rng  01/20/2015 01/14/2015 01/13/2015  WBC 4.0 - 10.5 K/uL 4.9 7.6 10.2  Hemoglobin 12.0 - 15.0 g/dL 11.6(L) 11.9(L) 12.7  Hematocrit 36.0 - 46.0 % 33.7(L) 36.2 37.7  Platelets 150 - 400 K/uL 285 204 199   CMP Latest Ref Rng 01/20/2015 01/20/2015 01/18/2015  Glucose 65 - 99 mg/dL 134(H) - 141(H)  BUN 6 - 20 mg/dL <5(L) - <5(L)  Creatinine 0.44 - 1.00 mg/dL 0.74 0.72 0.72  Sodium 135 - 145 mmol/L 141 - 139  Potassium 3.5 - 5.1 mmol/L 3.0(L) - 3.6  Chloride 101 - 111 mmol/L 101 - 99(L)  CO2 22 - 32 mmol/L 31 - 30  Calcium 8.9 - 10.3 mg/dL 9.2 - 9.3  Total Protein 6.5 - 8.1 g/dL 6.3(L) - -  Total Bilirubin 0.3 - 1.2 mg/dL 0.8 - -  Alkaline Phos 38 - 126 U/L 108 - -  AST 15 - 41 U/L 36 - -  ALT 14 - 54 U/L 37 - -    Disposition: 04-Intermediate Care Facility     Medication List    STOP taking these medications        CALCIUM + D PO     cephALEXin 500 MG capsule  Commonly known as:  KEFLEX     meloxicam 7.5 MG tablet  Commonly known as:  MOBIC     omeprazole 20 MG capsule  Commonly known as:  PRILOSEC      TAKE these medications        acetaminophen  325 MG tablet  Commonly known as:  TYLENOL  Take 2 tablets (650 mg total) by mouth every 6 (six) hours as needed for mild pain (or temp > 100).     CALCIUM-VITAMIN D3 PO  Take 1 tablet by mouth 2 (two) times daily.     Fish Oil 1000 MG Caps  Take 1 capsule by mouth 2 (two) times daily.     HYDROcodone-acetaminophen 5-325 MG tablet  Commonly known as:  NORCO/VICODIN  Take 1 tablet by mouth every 6 (six) hours as needed for moderate pain.     lisinopril-hydrochlorothiazide 20-12.5 MG tablet  Commonly known as:  PRINZIDE,ZESTORETIC  Take 1 tablet by mouth daily.     multivitamin with minerals Tabs tablet  Take 1 tablet by mouth daily.     nabumetone 750 MG tablet  Commonly known as:  RELAFEN  Take 750 mg by mouth 2 (two) times daily.     oxyCODONE 5 MG immediate release tablet  Commonly known as:  Oxy IR/ROXICODONE   Take 1-2 tablets (5-10 mg total) by mouth every 4 (four) hours as needed for moderate pain or severe pain (you can offer her tylenol with this.).     pantoprazole 40 MG tablet  Commonly known as:  PROTONIX  Take 40 mg by mouth 2 (two) times daily.     potassium chloride SA 20 MEQ tablet  Commonly known as:  K-DUR,KLOR-CON  Take 1 daily and check with your Primary care doctor about continuing this medicine.     RED YEAST RICE PO  Take 1 tablet by mouth 2 (two) times daily.       Follow-up Information    Follow up with [facility] On 01/27/2015.   Specialty:  General Surgery   Why:  You have an appointment for staple removal with the nurse at 2:00PM, be at the office about 15 minutes early for check in.   Contact information:   [place] [address] [phone]       Call Rolm Bookbinder, MD.   Specialty:  General Surgery   Why:  CAll Monday1/23/17 and they should have an appointment, the office is working to fit you in now.   Contact information:   [place] [address] [phone]       Follow up with Horatio Pel, MD.   Specialty:  Internal Medicine   Why:  Call and mae a follow up appointment to review all your Medical issues and decide on blood pressure medicines.   Contact information:   [address] [place] [address] [phone]       Signed: Earnstine Regal 01/21/2015, 11:40 AM

## 2015-01-21 NOTE — Discharge Instructions (Signed)
[facility]      [facility] [phone]  OPEN ABDOMINAL SURGERY: POST OP INSTRUCTIONS  Always review your discharge instruction sheet given to you by the facility where your surgery was performed.  IF YOU HAVE DISABILITY OR FAMILY LEAVE FORMS, YOU MUST BRING THEM TO THE OFFICE FOR PROCESSING.  PLEASE DO NOT GIVE THEM TO YOUR DOCTOR.  1. A prescription for pain medication may be given to you upon discharge.  Take your pain medication as prescribed, if needed.  If narcotic pain medicine is not needed, then you may take acetaminophen (Tylenol) or ibuprofen (Advil) as needed. 2. Take your usually prescribed medications unless otherwise directed. 3. If you need a refill on your pain medication, please contact your pharmacy. They will contact our office to request authorization.  Prescriptions will not be filled after 5pm or on week-ends. 4. You should follow a light diet the first few days after arrival home, such as soup and crackers, pudding, etc.unless your doctor has advised otherwise. A high-fiber, low fat diet can be resumed as tolerated.   Be sure to include lots of fluids daily. Most patients will experience some swelling and bruising on the chest and neck area.  Ice packs will help.  Swelling and bruising can take several days to resolve 5. Most patients will experience some swelling and bruising in the area of the incision. Ice pack will help. Swelling and bruising can take several days to resolve..  6. It is common to experience some constipation if taking pain medication after surgery.  Increasing fluid intake and taking a stool softener will usually help or prevent this problem from occurring.  A mild laxative (Milk of Magnesia or Miralax) should be taken according to package directions if there are no bowel movements after 48 hours. 7.  You may have steri-strips (small skin tapes) in place directly over the incision.  These strips should be left on the skin for 7-10 days.  If your  surgeon used skin glue on the incision, you may shower in 24 hours.  The glue will flake off over the next 2-3 weeks.  Any sutures or staples will be removed at the office during your follow-up visit. You may find that a light gauze bandage over your incision may keep your staples from being rubbed or pulled. You may shower and replace the bandage daily. 8. ACTIVITIES:  You may resume regular (light) daily activities beginning the next day--such as daily self-care, walking, climbing stairs--gradually increasing activities as tolerated.  You may have sexual intercourse when it is comfortable.  Refrain from any heavy lifting or straining until approved by your doctor. a. You may drive when you no longer are taking prescription pain medication, you can comfortably wear a seatbelt, and you can safely maneuver your car and apply brakes b. Return to Work: ___________________________________ 9. You should see your doctor in the office for a follow-up appointment approximately two weeks after your surgery.  Make sure that you call for this appointment within a day or two after you arrive home to insure a convenient appointment time. OTHER INSTRUCTIONS:  _____________________________________________________________ _____________________________________________________________  WHEN TO CALL YOUR DOCTOR: 1. Fever over 101.0 2. Inability to urinate 3. Nausea and/or vomiting 4. Extreme swelling or bruising 5. Continued bleeding from incision. 6. Increased pain, redness, or drainage from the incision. 7. Difficulty swallowing or breathing 8. Muscle cramping or spasms. 9. Numbness or tingling in hands or feet or around lips.  The clinic staff is available to   answer your questions during regular business hours.  Please don't hesitate to call and ask to speak to one of the nurses if you have concerns.  For further questions, please visit [email or web address]   

## 2015-01-21 NOTE — Progress Notes (Signed)
9 Days Post-Op  Subjective: She is making progress.  Eating some, not allot still feels bloated with PO, but no nausea, no cramping.  She had a BM yesterday and may be about ready to go home.    Objective: Vital signs in last 24 hours: Temp:  [98.2 F (36.8 C)-99 F (37.2 C)] 98.2 F (36.8 C) (01/20 0813) Pulse Rate:  [76-87] 76 (01/20 0436) Resp:  [18] 18 (01/20 0436) BP: (129-140)/(61-72) 135/61 mmHg (01/20 0436) SpO2:  [96 %-100 %] 97 % (01/20 0436) Last BM Date: 01/20/15 342 Soft diet Urine 1200, no BM recorded Afebrile, VSS No labs today on K+ supplement Intake/Output from previous day: 01/19 0701 - 01/20 0700 In: 1593.3 [P.O.:342; I.V.:1251.3] Out: 1200 [Urine:1200] Intake/Output this shift:    General appearance: alert, cooperative and no distress Resp: clear to auscultation bilaterally GI: soft, still sore, but better taking just PO meds for pain.  + BS, + BM, incision looks fine.  Lab Results:   Recent Labs  01/20/15 0845  WBC 4.9  HGB 11.6*  HCT 33.7*  PLT 285    BMET  Recent Labs  01/20/15 0526 01/20/15 0845  NA  --  141  K  --  3.0*  CL  --  101  CO2  --  31  GLUCOSE  --  134*  BUN  --  <5*  CREATININE 0.72 0.74  CALCIUM  --  9.2   PT/INR No results for input(s): LABPROT, INR in the last 72 hours.   Recent Labs Lab 01/20/15 0845  AST 36  ALT 37  ALKPHOS 108  BILITOT 0.8  PROT 6.3*  ALBUMIN 2.9*     Lipase     Component Value Date/Time   LIPASE 46 01/12/2015 1650     Studies/Results: No results found.  Medications: . enoxaparin (LOVENOX) injection  40 mg Subcutaneous Q24H  . lisinopril  20 mg Oral Daily   And  . hydrochlorothiazide  12.5 mg Oral Daily  . pantoprazole  40 mg Oral BID  . potassium chloride  20 mEq Oral BID    Assessment/Plan [name], ulcerative colitis S/p Open right colectomy 01/13/15, dr. Rolm Bookbinder  POD # 8 Hypertension GERD Lumbar disc disease Hyperlipidemia Antibiotics: Pre op  only DVT: Lovenox/SCD   Plan:  Saline lock IV, let her eat and see how she feels, we may be able to send her home later today. I would leave her sutures in till next week and have them removed in the office.  11:30AM:  She is doing well and would like to go home today.  I will discharge when her ride arrives.    LOS: 8 days    Herbie Lehrmann 01/21/2015

## 2015-01-21 NOTE — Progress Notes (Signed)
Patient discharged to home with instructions. 

## 2015-02-09 ENCOUNTER — Other Ambulatory Visit: Payer: Self-pay

## 2015-02-09 DIAGNOSIS — Z1231 Encounter for screening mammogram for malignant neoplasm of breast: Secondary | ICD-10-CM

## 2015-04-04 ENCOUNTER — Ambulatory Visit
Admission: RE | Admit: 2015-04-04 | Discharge: 2015-04-04 | Disposition: A | Discharge status: Home / Self Care | Payer: BLUE CROSS/BLUE SHIELD | Source: Ambulatory Visit

## 2015-04-04 DIAGNOSIS — Z1231 Encounter for screening mammogram for malignant neoplasm of breast: Secondary | ICD-10-CM

## 2015-05-31 ENCOUNTER — Other Ambulatory Visit: Payer: Self-pay | Admitting: Gastroenterology

## 2016-01-24 DIAGNOSIS — M25511 Pain in right shoulder: Secondary | ICD-10-CM | POA: Diagnosis not present

## 2016-01-24 DIAGNOSIS — M545 Low back pain: Secondary | ICD-10-CM | POA: Diagnosis not present

## 2016-01-24 DIAGNOSIS — M25512 Pain in left shoulder: Secondary | ICD-10-CM | POA: Diagnosis not present

## 2016-01-24 DIAGNOSIS — M67912 Unspecified disorder of synovium and tendon, left shoulder: Secondary | ICD-10-CM | POA: Diagnosis not present

## 2016-01-24 DIAGNOSIS — M75121 Complete rotator cuff tear or rupture of right shoulder, not specified as traumatic: Secondary | ICD-10-CM | POA: Diagnosis not present

## 2016-01-24 DIAGNOSIS — M5442 Lumbago with sciatica, left side: Secondary | ICD-10-CM | POA: Diagnosis not present

## 2016-02-07 DIAGNOSIS — L282 Other prurigo: Secondary | ICD-10-CM | POA: Diagnosis not present

## 2016-02-07 DIAGNOSIS — L309 Dermatitis, unspecified: Secondary | ICD-10-CM | POA: Diagnosis not present

## 2016-03-01 ENCOUNTER — Other Ambulatory Visit: Payer: Self-pay | Admitting: Internal Medicine

## 2016-03-01 DIAGNOSIS — Z1231 Encounter for screening mammogram for malignant neoplasm of breast: Secondary | ICD-10-CM

## 2016-04-05 ENCOUNTER — Ambulatory Visit: Payer: BLUE CROSS/BLUE SHIELD

## 2016-04-17 DIAGNOSIS — M47816 Spondylosis without myelopathy or radiculopathy, lumbar region: Secondary | ICD-10-CM | POA: Diagnosis not present

## 2016-04-24 ENCOUNTER — Ambulatory Visit
Admission: RE | Admit: 2016-04-24 | Discharge: 2016-04-24 | Disposition: A | Discharge status: Home / Self Care | Payer: Medicare HMO | Source: Ambulatory Visit | Attending: Internal Medicine | Admitting: Internal Medicine

## 2016-04-24 DIAGNOSIS — Z1231 Encounter for screening mammogram for malignant neoplasm of breast: Secondary | ICD-10-CM

## 2016-04-24 DIAGNOSIS — M47816 Spondylosis without myelopathy or radiculopathy, lumbar region: Secondary | ICD-10-CM | POA: Diagnosis not present

## 2016-05-29 DIAGNOSIS — K5100 Ulcerative (chronic) pancolitis without complications: Secondary | ICD-10-CM | POA: Diagnosis not present

## 2016-07-10 DIAGNOSIS — M25562 Pain in left knee: Secondary | ICD-10-CM | POA: Diagnosis not present

## 2016-07-10 DIAGNOSIS — M1712 Unilateral primary osteoarthritis, left knee: Secondary | ICD-10-CM | POA: Diagnosis not present

## 2016-07-27 ENCOUNTER — Emergency Department (HOSPITAL_COMMUNITY)
Admission: EM | Admit: 2016-07-27 | Discharge: 2016-07-27 | Disposition: A | Discharge status: Home / Self Care | Payer: Medicare HMO | Attending: Emergency Medicine | Admitting: Emergency Medicine

## 2016-07-27 ENCOUNTER — Emergency Department (HOSPITAL_COMMUNITY): Payer: Medicare HMO

## 2016-07-27 ENCOUNTER — Encounter (HOSPITAL_COMMUNITY): Payer: Self-pay | Admitting: *Deleted

## 2016-07-27 DIAGNOSIS — W57XXXA Bitten or stung by nonvenomous insect and other nonvenomous arthropods, initial encounter: Secondary | ICD-10-CM | POA: Diagnosis not present

## 2016-07-27 DIAGNOSIS — I10 Essential (primary) hypertension: Secondary | ICD-10-CM | POA: Insufficient documentation

## 2016-07-27 DIAGNOSIS — R112 Nausea with vomiting, unspecified: Secondary | ICD-10-CM | POA: Insufficient documentation

## 2016-07-27 DIAGNOSIS — E860 Dehydration: Secondary | ICD-10-CM | POA: Insufficient documentation

## 2016-07-27 DIAGNOSIS — R079 Chest pain, unspecified: Secondary | ICD-10-CM | POA: Diagnosis not present

## 2016-07-27 DIAGNOSIS — Z79899 Other long term (current) drug therapy: Secondary | ICD-10-CM | POA: Insufficient documentation

## 2016-07-27 LAB — URINALYSIS, ROUTINE W REFLEX MICROSCOPIC
Bilirubin Urine: NEGATIVE
Glucose, UA: NEGATIVE mg/dL
Hgb urine dipstick: NEGATIVE
Ketones, ur: NEGATIVE mg/dL
Leukocytes, UA: NEGATIVE
Nitrite: NEGATIVE
Protein, ur: NEGATIVE mg/dL
Specific Gravity, Urine: 1.004 — ABNORMAL LOW (ref 1.005–1.030)
pH: 5 (ref 5.0–8.0)

## 2016-07-27 LAB — CBC
HCT: 34.9 % — ABNORMAL LOW (ref 36.0–46.0)
Hemoglobin: 11.9 g/dL — ABNORMAL LOW (ref 12.0–15.0)
MCH: 29.7 pg (ref 26.0–34.0)
MCHC: 34.1 g/dL (ref 30.0–36.0)
MCV: 87 fL (ref 78.0–100.0)
Platelets: 211 10*3/uL (ref 150–400)
RBC: 4.01 MIL/uL (ref 3.87–5.11)
RDW: 14.8 % (ref 11.5–15.5)
WBC: 5.4 10*3/uL (ref 4.0–10.5)

## 2016-07-27 LAB — TROPONIN I: Troponin I: <0.03 ng/mL (ref <0.03)

## 2016-07-27 LAB — LIPASE, BLOOD: Lipase: 50 U/L (ref 11–51)

## 2016-07-27 LAB — COMPREHENSIVE METABOLIC PANEL
ALT: 16 U/L (ref 14–54)
AST: 20 U/L (ref 15–41)
Albumin: 3.7 g/dL (ref 3.5–5.0)
Alkaline Phosphatase: 69 U/L (ref 38–126)
Anion gap: 10 (ref 5–15)
BUN: 17 mg/dL (ref 6–20)
CO2: 23 mmol/L (ref 22–32)
Calcium: 9.5 mg/dL (ref 8.9–10.3)
Chloride: 106 mmol/L (ref 101–111)
Creatinine, Ser: 1.17 mg/dL — ABNORMAL HIGH (ref 0.44–1.00)
GFR calc Af Amer: 54 mL/min — ABNORMAL LOW (ref >60)
GFR calc non Af Amer: 46 mL/min — ABNORMAL LOW (ref >60)
Glucose, Bld: 121 mg/dL — ABNORMAL HIGH (ref 65–99)
Potassium: 3.3 mmol/L — ABNORMAL LOW (ref 3.5–5.1)
Sodium: 139 mmol/L (ref 135–145)
Total Bilirubin: 0.9 mg/dL (ref 0.3–1.2)
Total Protein: 6.9 g/dL (ref 6.5–8.1)

## 2016-07-27 MED ORDER — GI COCKTAIL ~~LOC~~
30.0000 mL | Freq: Once | ORAL | Status: AC
  Administered 2016-07-27: 30 mL via ORAL
  Filled 2016-07-27: qty 30

## 2016-07-27 MED ORDER — ONDANSETRON HCL 4 MG/2ML IJ SOLN
4.0000 mg | Freq: Once | INTRAMUSCULAR | Status: AC
  Administered 2016-07-27: 4 mg via INTRAVENOUS
  Filled 2016-07-27: qty 2

## 2016-07-27 MED ORDER — ONDANSETRON 4 MG PO TBDP: 4.0000 mg | ORAL_TABLET | Freq: Three times a day (TID) | ORAL | 0 refills | Status: DC | PRN

## 2016-07-27 MED ORDER — SODIUM CHLORIDE 0.9 % IV BOLUS (SEPSIS)
1000.0000 mL | Freq: Once | INTRAVENOUS | Status: AC
  Administered 2016-07-27: 1000 mL via INTRAVENOUS

## 2016-07-27 NOTE — ED Notes (Signed)
Pt. Placed back on the monitor.  Labs drawn.

## 2016-07-27 NOTE — ED Triage Notes (Signed)
Pt c/o weakness & nausea, pt reports x 1 vomiting episode today, reports being bitten by a tick last week, no redness at removal site, pt reports starting new  Med for ulcerative colitis last week, pt c/o weakness, A&O x 4

## 2016-07-27 NOTE — ED Notes (Signed)
Declined W/C at D/C and was escorted to lobby by RN. 

## 2016-07-27 NOTE — ED Provider Notes (Signed)
[facility] DEPT Provider Note   CSN: [id number] Arrival date & time: 07/27/16  0705     History   Chief Complaint Chief Complaint  Patient presents with  . Insect Bite    HPI Donna [name] is a 71 y.o. female.  Pt presents to the ED today with weakness and nausea.  She said she feels nauseous and some discomfort in her chest.  She was bitten by a tick on her back about 1 week ago.  No redness or itching at the site.  She did start a new med for UC last week.  She denies any abd pain.  She vomited once this morning.      Past Medical History:  Diagnosis Date  . Arthritis   . Bulging lumbar disc   . Degeneration of lumbar or lumbosacral intervertebral disc 01/21/2015  . Essential hypertension 01/21/2015  . GERD (gastroesophageal reflux disease)   . GERD (gastroesophageal reflux disease) 01/21/2015  . Heart murmur   . Hyperlipidemia   . Hyperlipidemia 01/21/2015  . Hypertension   . Ulcerative colitis [facility])     Patient Active Problem List   Diagnosis Date Noted  . Essential hypertension 01/21/2015  . GERD (gastroesophageal reflux disease) 01/21/2015  . Degeneration of lumbar or lumbosacral intervertebral disc 01/21/2015  . Hyperlipidemia 01/21/2015  . [name] ([place]) 01/13/2015  . Bilateral carpal tunnel syndrome 01/05/2015    Past Surgical History:  Procedure Laterality Date  . CESAREAN SECTION    . LAPAROTOMY Right 01/12/2015   Procedure: OPEN RIGHT COLECTOMY;  Surgeon: Rolm Bookbinder, MD;  Location: [facility];  Service: General;  Laterality: Right;  . RIGHT COLECTOMY  01/12/2015    OB History    No data available       Home Medications    Prior to Admission medications   Medication Sig Start Date End Date Taking? Authorizing Provider  acetaminophen (TYLENOL) 325 MG tablet Take 2 tablets (650 mg total) by mouth every 6 (six) hours as needed for mild pain (or temp > 100). 01/21/15   Earnstine Regal, PA-C  Calcium Carbonate-Vitamin D  (CALCIUM-VITAMIN D3 PO) Take 1 tablet by mouth 2 (two) times daily.    [provider]  HYDROcodone-acetaminophen (NORCO/VICODIN) 5-325 MG tablet Take 1 tablet by mouth every 6 (six) hours as needed for moderate pain.    [provider]  lisinopril-hydrochlorothiazide (PRINZIDE,ZESTORETIC) 20-12.5 MG tablet Take 1 tablet by mouth daily.    [provider]  Multiple Vitamin (MULTIVITAMIN WITH MINERALS) TABS tablet Take 1 tablet by mouth daily.    [provider]  nabumetone (RELAFEN) 750 MG tablet Take 750 mg by mouth 2 (two) times daily.    [provider]  Omega-3 Fatty Acids (FISH OIL) 1000 MG CAPS Take 1 capsule by mouth 2 (two) times daily.    [provider]  ondansetron (ZOFRAN ODT) 4 MG disintegrating tablet Take 1 tablet (4 mg total) by mouth every 8 (eight) hours as needed. 07/27/16   Isla Pence, MD  oxyCODONE (OXY IR/ROXICODONE) 5 MG immediate release tablet Take 1-2 tablets (5-10 mg total) by mouth every 4 (four) hours as needed for moderate pain or severe pain (you can offer her tylenol with this.). 01/21/15   Earnstine Regal, PA-C  pantoprazole (PROTONIX) 40 MG tablet Take 40 mg by mouth 2 (two) times daily.    [provider]  potassium chloride SA (K-DUR,KLOR-CON) 20 MEQ tablet Take 1 daily and check with your Primary care doctor about continuing this  medicine. 01/21/15   Earnstine Regal, PA-C  Red Yeast Rice Extract (RED YEAST RICE PO) Take 1 tablet by mouth 2 (two) times daily.    [provider]    Family History No family history on file.  Social History Social History  Substance Use Topics  . Smoking status: Never Smoker  . Smokeless tobacco: Never Used  . Alcohol use No     Allergies   Ivp dye [iodinated diagnostic agents] and Statins   Review of Systems Review of Systems  Cardiovascular: Positive for chest pain.  Gastrointestinal: Positive for nausea and vomiting.  All other systems  reviewed and are negative.    Physical Exam Updated Vital Signs BP 131/83 (BP Location: Left Arm)   Pulse 74   Temp 97.9 F (36.6 C) (Oral)   Resp 16   Ht 5' 3.5" (1.613 m)   Wt 80.7 kg (178 lb)   SpO2 97%   BMI 31.04 kg/m   Physical Exam  Constitutional: She is oriented to person, place, and time. She appears well-developed and well-nourished.  HENT:  Head: Normocephalic and atraumatic.  Right Ear: External ear normal.  Left Ear: External ear normal.  Nose: Nose normal.  Mouth/Throat: Oropharynx is clear and moist.  Eyes: Pupils are equal, round, and reactive to light. Conjunctivae and EOM are normal.  Neck: Normal range of motion. Neck supple.  Cardiovascular: Normal rate, regular rhythm, normal heart sounds and intact distal pulses.   Pulmonary/Chest: Effort normal and breath sounds normal.  Abdominal: Soft. Bowel sounds are normal.  Musculoskeletal: Normal range of motion.  Neurological: She is alert and oriented to person, place, and time.  Skin: Skin is warm and dry.  Psychiatric: She has a normal mood and affect. Her behavior is normal. Judgment and thought content normal.  Nursing note and vitals reviewed.    ED Treatments / Results  Labs (all labs ordered are listed, but only abnormal results are displayed) Labs Reviewed  COMPREHENSIVE METABOLIC PANEL - Abnormal; Notable for the following:       Result Value   Potassium 3.3 (*)    Glucose, Bld 121 (*)    Creatinine, Ser 1.17 (*)    GFR calc non Af Amer 46 (*)    GFR calc Af Amer 54 (*)    All other components within normal limits  CBC - Abnormal; Notable for the following:    Hemoglobin 11.9 (*)    HCT 34.9 (*)    All other components within normal limits  URINALYSIS, ROUTINE W REFLEX MICROSCOPIC - Abnormal; Notable for the following:    Specific Gravity, Urine 1.004 (*)    All other components within normal limits  LIPASE, BLOOD  TROPONIN I  ROCKY MTN SPOTTED FVR ABS PNL(IGG+IGM)    EKG  EKG  Interpretation  Date/Time:  Friday July 27 2016 09:30:13 EDT Ventricular Rate:  85 PR Interval:    QRS Duration: 93 QT Interval:  359 QTC Calculation: 427 R Axis:   -21 Text Interpretation:  Sinus rhythm Borderline left axis deviation Abnormal R-wave progression, early transition No significant change since last tracing Confirmed by Isla Pence [phone]) on 07/27/2016 9:37:55 AM       Radiology Dg Chest 2 View  Result Date: 07/27/2016 CLINICAL DATA:  Mid chest pain for several weeks, worse today. History of acid reflux and hiatal hernia. EXAM: CHEST  2 VIEW COMPARISON:  09/19/2014 ; CT abdomen pelvis - 01/12/2015 FINDINGS: Grossly unchanged cardiac silhouette and mediastinal contours with atherosclerotic plaque  within the thoracic aorta. Mild diffuse slightly nodular thickening of the pulmonary interstitium. No pleural effusion or pneumothorax. No evidence of edema peer No acute osseus abnormalities. Moderate to severe scoliotic curvature of the thoracolumbar spine. IMPRESSION: 1. Chronic bronchitic change without superimposed acute cardiopulmonary disease. 2.  Aortic Atherosclerosis (ICD10-I70.0). Electronically Signed   By: Sandi Mariscal M.D.   On: 07/27/2016 10:04    Procedures Procedures (including critical care time)  Medications Ordered in ED Medications  sodium chloride 0.9 % bolus 1,000 mL (1,000 mLs Intravenous New Bag/Given 07/27/16 0943)  ondansetron (ZOFRAN) injection 4 mg (4 mg Intravenous Given 07/27/16 0941)  gi cocktail (Maalox,Lidocaine,Donnatal) (30 mLs Oral Given 07/27/16 0940)     Initial Impression / Assessment and Plan / ED Course  I have reviewed the triage vital signs and the nursing notes.  Pertinent labs & imaging results that were available during my care of the patient were reviewed by me and considered in my medical decision making (see chart for details).  Pt is feeling much better.  She is able to tolerate liquids.  She knows to return if worse.  I told  her we'd call her if the RMSF was +.  F/u with pcp.  Final Clinical Impressions(s) / ED Diagnoses   Final diagnoses:  Non-intractable vomiting with nausea, unspecified vomiting type  Dehydration    New Prescriptions New Prescriptions   ONDANSETRON (ZOFRAN ODT) 4 MG DISINTEGRATING TABLET    Take 1 tablet (4 mg total) by mouth every 8 (eight) hours as needed.     Isla Pence, MD 07/27/16 1152

## 2016-07-27 NOTE — ED Notes (Signed)
Pt. Ambulated to the bathroom for urine specimen, gait steady.

## 2016-07-31 LAB — ROCKY MTN SPOTTED FVR ABS PNL(IGG+IGM)
RMSF IgG: NEGATIVE
RMSF IgM: 0.57 index (ref 0.00–0.89)

## 2016-08-07 DIAGNOSIS — M1712 Unilateral primary osteoarthritis, left knee: Secondary | ICD-10-CM | POA: Diagnosis not present

## 2016-08-14 DIAGNOSIS — M1712 Unilateral primary osteoarthritis, left knee: Secondary | ICD-10-CM | POA: Diagnosis not present

## 2016-08-21 DIAGNOSIS — K5100 Ulcerative (chronic) pancolitis without complications: Secondary | ICD-10-CM | POA: Diagnosis not present

## 2016-08-21 DIAGNOSIS — K5289 Other specified noninfective gastroenteritis and colitis: Secondary | ICD-10-CM | POA: Diagnosis not present

## 2016-08-23 DIAGNOSIS — M1712 Unilateral primary osteoarthritis, left knee: Secondary | ICD-10-CM | POA: Diagnosis not present

## 2016-08-27 DIAGNOSIS — K5289 Other specified noninfective gastroenteritis and colitis: Secondary | ICD-10-CM | POA: Diagnosis not present

## 2016-10-29 DIAGNOSIS — M19011 Primary osteoarthritis, right shoulder: Secondary | ICD-10-CM | POA: Diagnosis not present

## 2016-10-29 DIAGNOSIS — M1712 Unilateral primary osteoarthritis, left knee: Secondary | ICD-10-CM | POA: Diagnosis not present

## 2016-10-29 DIAGNOSIS — M19012 Primary osteoarthritis, left shoulder: Secondary | ICD-10-CM | POA: Diagnosis not present

## 2016-11-06 DIAGNOSIS — E7800 Pure hypercholesterolemia, unspecified: Secondary | ICD-10-CM | POA: Diagnosis not present

## 2016-11-06 DIAGNOSIS — I10 Essential (primary) hypertension: Secondary | ICD-10-CM | POA: Diagnosis not present

## 2016-11-09 DIAGNOSIS — K219 Gastro-esophageal reflux disease without esophagitis: Secondary | ICD-10-CM | POA: Diagnosis not present

## 2016-11-09 DIAGNOSIS — R011 Cardiac murmur, unspecified: Secondary | ICD-10-CM | POA: Diagnosis not present

## 2016-11-09 DIAGNOSIS — R7309 Other abnormal glucose: Secondary | ICD-10-CM | POA: Diagnosis not present

## 2016-11-09 DIAGNOSIS — E7800 Pure hypercholesterolemia, unspecified: Secondary | ICD-10-CM | POA: Diagnosis not present

## 2016-11-09 DIAGNOSIS — Z791 Long term (current) use of non-steroidal anti-inflammatories (NSAID): Secondary | ICD-10-CM | POA: Diagnosis not present

## 2016-11-09 DIAGNOSIS — M549 Dorsalgia, unspecified: Secondary | ICD-10-CM | POA: Diagnosis not present

## 2016-11-09 DIAGNOSIS — Z0001 Encounter for general adult medical examination with abnormal findings: Secondary | ICD-10-CM | POA: Diagnosis not present

## 2016-11-09 DIAGNOSIS — K5190 Ulcerative colitis, unspecified, without complications: Secondary | ICD-10-CM | POA: Diagnosis not present

## 2016-11-09 DIAGNOSIS — Z789 Other specified health status: Secondary | ICD-10-CM | POA: Diagnosis not present

## 2016-11-13 DIAGNOSIS — J029 Acute pharyngitis, unspecified: Secondary | ICD-10-CM | POA: Diagnosis not present

## 2016-11-22 IMAGING — CR DG ABD PORTABLE 1V
1 series · 1 of 1 positions shown · non-contrast
Comparison: 01/12/2015 CT

CLINICAL DATA: Ileus.  Abdominal pain.  Nausea.

EXAM:
PORTABLE ABDOMEN - 1 VIEW

[AP]
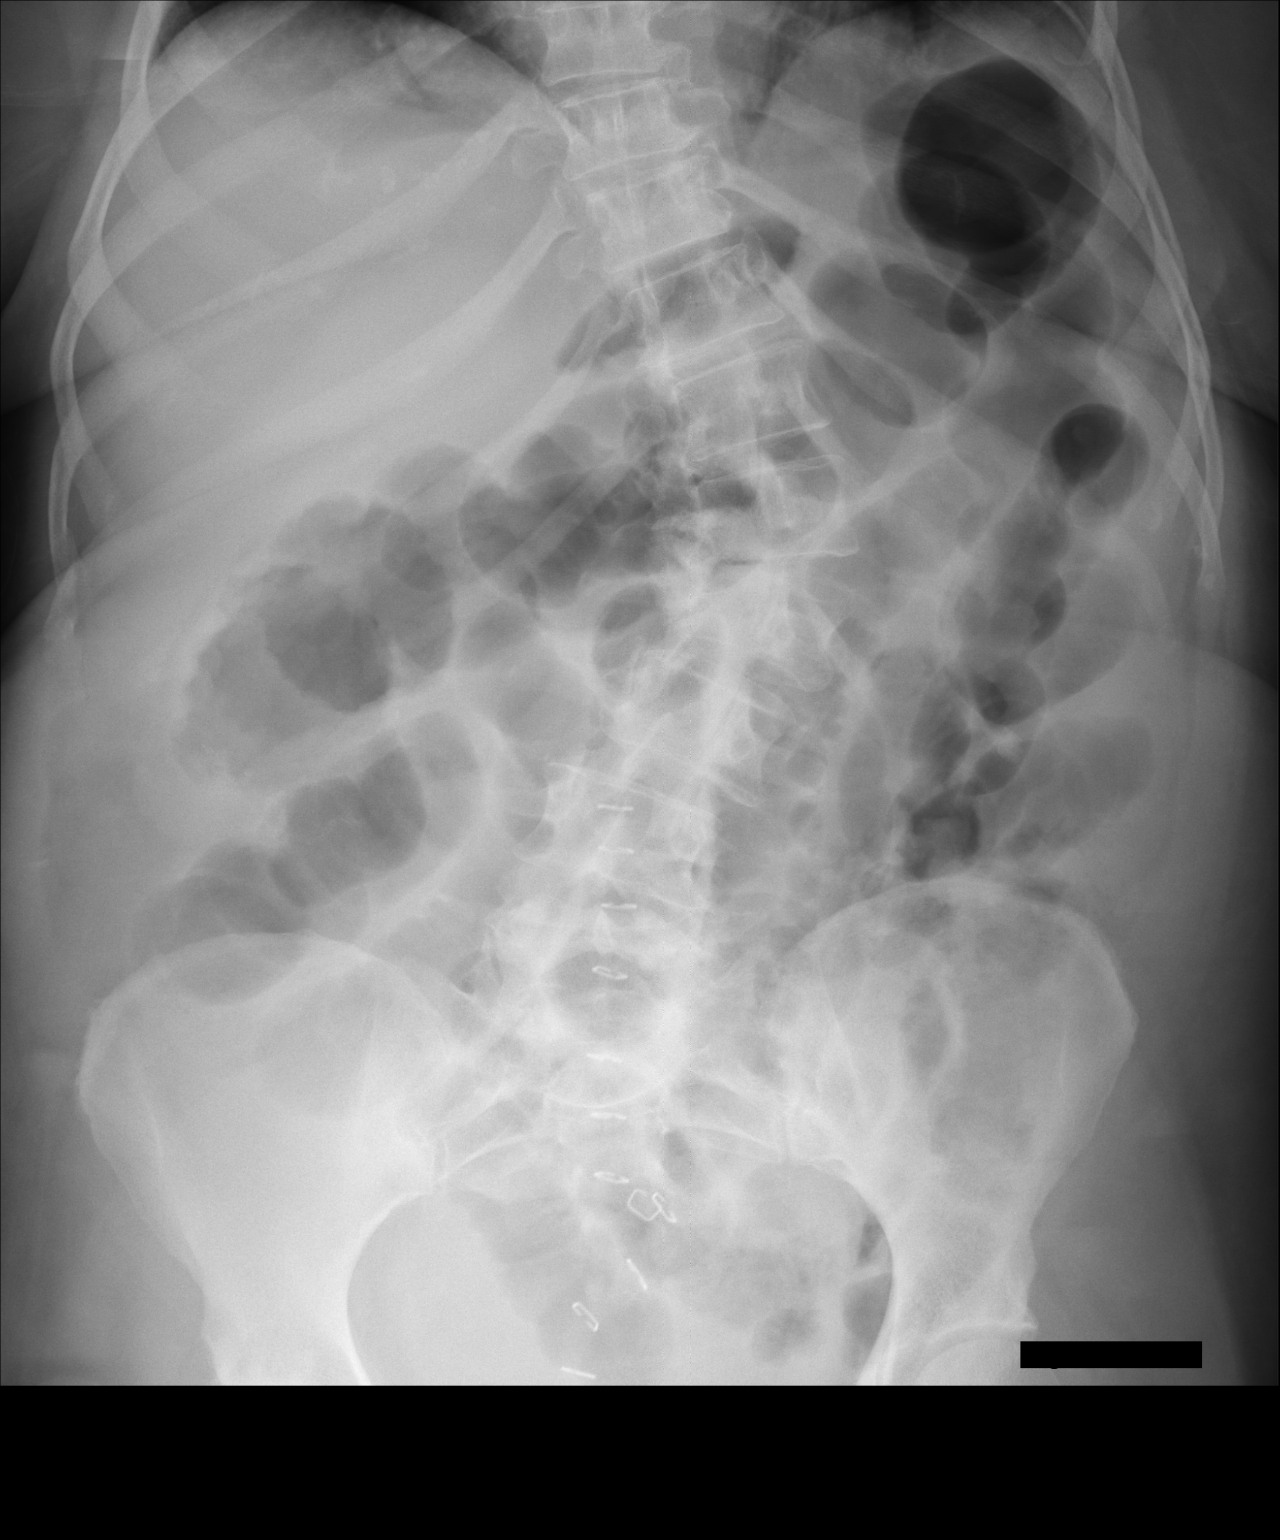

[1 of 1 positions shown; findings below may reference images not displayed]

FINDINGS: Gas throughout nondistended large and small bowel. No free air. No
organomegaly or suspicious calcification. Severe leftward scoliosis
in the thoracolumbar spine.
IMPRESSION: No evidence of bowel obstruction or free air.

## 2016-11-26 DIAGNOSIS — K5100 Ulcerative (chronic) pancolitis without complications: Secondary | ICD-10-CM | POA: Diagnosis not present

## 2016-12-06 DIAGNOSIS — M47816 Spondylosis without myelopathy or radiculopathy, lumbar region: Secondary | ICD-10-CM | POA: Diagnosis not present

## 2016-12-13 DIAGNOSIS — M1712 Unilateral primary osteoarthritis, left knee: Secondary | ICD-10-CM | POA: Diagnosis not present

## 2016-12-13 DIAGNOSIS — M25562 Pain in left knee: Secondary | ICD-10-CM | POA: Diagnosis not present

## 2016-12-18 DIAGNOSIS — H40053 Ocular hypertension, bilateral: Secondary | ICD-10-CM | POA: Diagnosis not present

## 2016-12-18 DIAGNOSIS — H2513 Age-related nuclear cataract, bilateral: Secondary | ICD-10-CM | POA: Diagnosis not present

## 2017-02-12 DIAGNOSIS — K5190 Ulcerative colitis, unspecified, without complications: Secondary | ICD-10-CM | POA: Diagnosis not present

## 2017-02-12 DIAGNOSIS — Z01419 Encounter for gynecological examination (general) (routine) without abnormal findings: Secondary | ICD-10-CM | POA: Diagnosis not present

## 2017-02-19 DIAGNOSIS — H2513 Age-related nuclear cataract, bilateral: Secondary | ICD-10-CM | POA: Diagnosis not present

## 2017-02-19 DIAGNOSIS — H40053 Ocular hypertension, bilateral: Secondary | ICD-10-CM | POA: Diagnosis not present

## 2017-03-18 ENCOUNTER — Other Ambulatory Visit: Payer: Self-pay | Admitting: Internal Medicine

## 2017-03-18 DIAGNOSIS — Z139 Encounter for screening, unspecified: Secondary | ICD-10-CM

## 2017-03-26 ENCOUNTER — Other Ambulatory Visit: Payer: Self-pay | Admitting: Gastroenterology

## 2017-03-26 DIAGNOSIS — K219 Gastro-esophageal reflux disease without esophagitis: Secondary | ICD-10-CM | POA: Diagnosis not present

## 2017-03-26 DIAGNOSIS — R1010 Upper abdominal pain, unspecified: Secondary | ICD-10-CM | POA: Diagnosis not present

## 2017-03-26 DIAGNOSIS — R140 Abdominal distension (gaseous): Secondary | ICD-10-CM | POA: Diagnosis not present

## 2017-03-26 DIAGNOSIS — K5100 Ulcerative (chronic) pancolitis without complications: Secondary | ICD-10-CM | POA: Diagnosis not present

## 2017-03-26 DIAGNOSIS — R1084 Generalized abdominal pain: Secondary | ICD-10-CM

## 2017-04-04 DIAGNOSIS — M67912 Unspecified disorder of synovium and tendon, left shoulder: Secondary | ICD-10-CM | POA: Diagnosis not present

## 2017-04-04 DIAGNOSIS — M1712 Unilateral primary osteoarthritis, left knee: Secondary | ICD-10-CM | POA: Diagnosis not present

## 2017-04-04 DIAGNOSIS — M25562 Pain in left knee: Secondary | ICD-10-CM | POA: Diagnosis not present

## 2017-04-04 DIAGNOSIS — M67911 Unspecified disorder of synovium and tendon, right shoulder: Secondary | ICD-10-CM | POA: Diagnosis not present

## 2017-04-08 ENCOUNTER — Ambulatory Visit
Admission: RE | Admit: 2017-04-08 | Discharge: 2017-04-08 | Disposition: A | Discharge status: Home / Self Care | Payer: Medicare HMO | Source: Ambulatory Visit | Attending: Gastroenterology | Admitting: Gastroenterology

## 2017-04-08 DIAGNOSIS — D3501 Benign neoplasm of right adrenal gland: Secondary | ICD-10-CM | POA: Diagnosis not present

## 2017-04-08 DIAGNOSIS — R1084 Generalized abdominal pain: Secondary | ICD-10-CM

## 2017-04-25 ENCOUNTER — Ambulatory Visit
Admission: RE | Admit: 2017-04-25 | Discharge: 2017-04-25 | Disposition: A | Discharge status: Home / Self Care | Payer: Medicare HMO | Source: Ambulatory Visit | Attending: Internal Medicine | Admitting: Internal Medicine

## 2017-04-25 DIAGNOSIS — Z139 Encounter for screening, unspecified: Secondary | ICD-10-CM

## 2017-04-25 DIAGNOSIS — Z1231 Encounter for screening mammogram for malignant neoplasm of breast: Secondary | ICD-10-CM | POA: Diagnosis not present

## 2017-05-01 DIAGNOSIS — R1010 Upper abdominal pain, unspecified: Secondary | ICD-10-CM | POA: Diagnosis not present

## 2017-05-01 DIAGNOSIS — K219 Gastro-esophageal reflux disease without esophagitis: Secondary | ICD-10-CM | POA: Diagnosis not present

## 2017-05-01 DIAGNOSIS — K5100 Ulcerative (chronic) pancolitis without complications: Secondary | ICD-10-CM | POA: Diagnosis not present

## 2017-06-19 DIAGNOSIS — M47816 Spondylosis without myelopathy or radiculopathy, lumbar region: Secondary | ICD-10-CM | POA: Diagnosis not present

## 2017-07-15 DIAGNOSIS — I10 Essential (primary) hypertension: Secondary | ICD-10-CM | POA: Diagnosis not present

## 2017-07-23 DIAGNOSIS — M75121 Complete rotator cuff tear or rupture of right shoulder, not specified as traumatic: Secondary | ICD-10-CM | POA: Diagnosis not present

## 2017-07-23 DIAGNOSIS — M67912 Unspecified disorder of synovium and tendon, left shoulder: Secondary | ICD-10-CM | POA: Diagnosis not present

## 2017-07-23 DIAGNOSIS — M67911 Unspecified disorder of synovium and tendon, right shoulder: Secondary | ICD-10-CM | POA: Diagnosis not present

## 2017-07-23 DIAGNOSIS — M25562 Pain in left knee: Secondary | ICD-10-CM | POA: Diagnosis not present

## 2017-08-05 DIAGNOSIS — I10 Essential (primary) hypertension: Secondary | ICD-10-CM | POA: Diagnosis not present

## 2017-08-20 DIAGNOSIS — H04123 Dry eye syndrome of bilateral lacrimal glands: Secondary | ICD-10-CM | POA: Diagnosis not present

## 2017-08-20 DIAGNOSIS — H40053 Ocular hypertension, bilateral: Secondary | ICD-10-CM | POA: Diagnosis not present

## 2017-08-27 DIAGNOSIS — H5711 Ocular pain, right eye: Secondary | ICD-10-CM | POA: Diagnosis not present

## 2017-08-27 DIAGNOSIS — H02831 Dermatochalasis of right upper eyelid: Secondary | ICD-10-CM | POA: Diagnosis not present

## 2017-08-27 DIAGNOSIS — H40053 Ocular hypertension, bilateral: Secondary | ICD-10-CM | POA: Diagnosis not present

## 2017-08-27 DIAGNOSIS — H2513 Age-related nuclear cataract, bilateral: Secondary | ICD-10-CM | POA: Diagnosis not present

## 2017-08-27 DIAGNOSIS — H04123 Dry eye syndrome of bilateral lacrimal glands: Secondary | ICD-10-CM | POA: Diagnosis not present

## 2017-08-27 DIAGNOSIS — H02834 Dermatochalasis of left upper eyelid: Secondary | ICD-10-CM | POA: Diagnosis not present

## 2017-08-30 DIAGNOSIS — K5100 Ulcerative (chronic) pancolitis without complications: Secondary | ICD-10-CM | POA: Diagnosis not present

## 2017-08-30 DIAGNOSIS — R1010 Upper abdominal pain, unspecified: Secondary | ICD-10-CM | POA: Diagnosis not present

## 2017-09-03 DIAGNOSIS — H2513 Age-related nuclear cataract, bilateral: Secondary | ICD-10-CM | POA: Diagnosis not present

## 2017-09-03 DIAGNOSIS — H04123 Dry eye syndrome of bilateral lacrimal glands: Secondary | ICD-10-CM | POA: Diagnosis not present

## 2017-09-03 DIAGNOSIS — H01001 Unspecified blepharitis right upper eyelid: Secondary | ICD-10-CM | POA: Diagnosis not present

## 2017-10-11 DIAGNOSIS — Z23 Encounter for immunization: Secondary | ICD-10-CM | POA: Diagnosis not present

## 2017-11-14 DIAGNOSIS — E7800 Pure hypercholesterolemia, unspecified: Secondary | ICD-10-CM | POA: Diagnosis not present

## 2017-11-14 DIAGNOSIS — I10 Essential (primary) hypertension: Secondary | ICD-10-CM | POA: Diagnosis not present

## 2017-11-14 DIAGNOSIS — Z791 Long term (current) use of non-steroidal anti-inflammatories (NSAID): Secondary | ICD-10-CM | POA: Diagnosis not present

## 2017-11-14 DIAGNOSIS — N183 Chronic kidney disease, stage 3 (moderate): Secondary | ICD-10-CM | POA: Diagnosis not present

## 2017-11-14 DIAGNOSIS — M159 Polyosteoarthritis, unspecified: Secondary | ICD-10-CM | POA: Diagnosis not present

## 2017-11-19 DIAGNOSIS — D72819 Decreased white blood cell count, unspecified: Secondary | ICD-10-CM | POA: Diagnosis not present

## 2017-11-19 DIAGNOSIS — I10 Essential (primary) hypertension: Secondary | ICD-10-CM | POA: Diagnosis not present

## 2017-11-19 DIAGNOSIS — Z0000 Encounter for general adult medical examination without abnormal findings: Secondary | ICD-10-CM | POA: Diagnosis not present

## 2017-11-19 DIAGNOSIS — R7309 Other abnormal glucose: Secondary | ICD-10-CM | POA: Diagnosis not present

## 2017-11-19 DIAGNOSIS — Z833 Family history of diabetes mellitus: Secondary | ICD-10-CM | POA: Diagnosis not present

## 2017-11-19 DIAGNOSIS — K219 Gastro-esophageal reflux disease without esophagitis: Secondary | ICD-10-CM | POA: Diagnosis not present

## 2017-11-19 DIAGNOSIS — K5190 Ulcerative colitis, unspecified, without complications: Secondary | ICD-10-CM | POA: Diagnosis not present

## 2017-11-19 DIAGNOSIS — E7800 Pure hypercholesterolemia, unspecified: Secondary | ICD-10-CM | POA: Diagnosis not present

## 2017-11-19 DIAGNOSIS — R011 Cardiac murmur, unspecified: Secondary | ICD-10-CM | POA: Diagnosis not present

## 2017-11-19 DIAGNOSIS — Z1159 Encounter for screening for other viral diseases: Secondary | ICD-10-CM | POA: Diagnosis not present

## 2017-12-12 DIAGNOSIS — Z01419 Encounter for gynecological examination (general) (routine) without abnormal findings: Secondary | ICD-10-CM | POA: Diagnosis not present

## 2017-12-12 DIAGNOSIS — Z780 Asymptomatic menopausal state: Secondary | ICD-10-CM | POA: Diagnosis not present

## 2017-12-17 DIAGNOSIS — M1712 Unilateral primary osteoarthritis, left knee: Secondary | ICD-10-CM | POA: Diagnosis not present

## 2017-12-17 DIAGNOSIS — M19011 Primary osteoarthritis, right shoulder: Secondary | ICD-10-CM | POA: Diagnosis not present

## 2017-12-17 DIAGNOSIS — M19012 Primary osteoarthritis, left shoulder: Secondary | ICD-10-CM | POA: Diagnosis not present

## 2017-12-17 DIAGNOSIS — M545 Low back pain: Secondary | ICD-10-CM | POA: Diagnosis not present

## 2017-12-19 DIAGNOSIS — D573 Sickle-cell trait: Secondary | ICD-10-CM | POA: Diagnosis not present

## 2017-12-19 DIAGNOSIS — Z791 Long term (current) use of non-steroidal anti-inflammatories (NSAID): Secondary | ICD-10-CM | POA: Diagnosis not present

## 2017-12-19 DIAGNOSIS — E7800 Pure hypercholesterolemia, unspecified: Secondary | ICD-10-CM | POA: Diagnosis not present

## 2017-12-19 DIAGNOSIS — I10 Essential (primary) hypertension: Secondary | ICD-10-CM | POA: Diagnosis not present

## 2018-01-09 DIAGNOSIS — M47816 Spondylosis without myelopathy or radiculopathy, lumbar region: Secondary | ICD-10-CM | POA: Diagnosis not present

## 2018-01-21 DIAGNOSIS — Z789 Other specified health status: Secondary | ICD-10-CM | POA: Diagnosis not present

## 2018-01-21 DIAGNOSIS — I10 Essential (primary) hypertension: Secondary | ICD-10-CM | POA: Diagnosis not present

## 2018-01-21 DIAGNOSIS — E7800 Pure hypercholesterolemia, unspecified: Secondary | ICD-10-CM | POA: Diagnosis not present

## 2018-02-04 DIAGNOSIS — I10 Essential (primary) hypertension: Secondary | ICD-10-CM | POA: Diagnosis not present

## 2018-02-11 DIAGNOSIS — M5416 Radiculopathy, lumbar region: Secondary | ICD-10-CM | POA: Diagnosis not present

## 2018-02-18 DIAGNOSIS — M545 Low back pain: Secondary | ICD-10-CM | POA: Diagnosis not present

## 2018-02-20 DIAGNOSIS — H04123 Dry eye syndrome of bilateral lacrimal glands: Secondary | ICD-10-CM | POA: Diagnosis not present

## 2018-02-20 DIAGNOSIS — H5711 Ocular pain, right eye: Secondary | ICD-10-CM | POA: Diagnosis not present

## 2018-02-20 DIAGNOSIS — H40053 Ocular hypertension, bilateral: Secondary | ICD-10-CM | POA: Diagnosis not present

## 2018-02-20 DIAGNOSIS — H02834 Dermatochalasis of left upper eyelid: Secondary | ICD-10-CM | POA: Diagnosis not present

## 2018-02-20 DIAGNOSIS — H2513 Age-related nuclear cataract, bilateral: Secondary | ICD-10-CM | POA: Diagnosis not present

## 2018-02-20 DIAGNOSIS — H02831 Dermatochalasis of right upper eyelid: Secondary | ICD-10-CM | POA: Diagnosis not present

## 2018-02-27 DIAGNOSIS — M545 Low back pain: Secondary | ICD-10-CM | POA: Diagnosis not present

## 2018-02-27 DIAGNOSIS — M1712 Unilateral primary osteoarthritis, left knee: Secondary | ICD-10-CM | POA: Diagnosis not present

## 2018-03-04 DIAGNOSIS — E7800 Pure hypercholesterolemia, unspecified: Secondary | ICD-10-CM | POA: Diagnosis not present

## 2018-03-04 DIAGNOSIS — I10 Essential (primary) hypertension: Secondary | ICD-10-CM | POA: Diagnosis not present

## 2018-03-04 DIAGNOSIS — Z789 Other specified health status: Secondary | ICD-10-CM | POA: Diagnosis not present

## 2018-03-07 DIAGNOSIS — M5416 Radiculopathy, lumbar region: Secondary | ICD-10-CM | POA: Diagnosis not present

## 2018-03-18 MED FILL — REPATHA SURECLICK 140 MG/ML: 140 | 30 days supply | Qty: 2 | Fill #0

## 2018-04-08 ENCOUNTER — Other Ambulatory Visit: Payer: Self-pay | Admitting: Internal Medicine

## 2018-04-08 DIAGNOSIS — Z1231 Encounter for screening mammogram for malignant neoplasm of breast: Secondary | ICD-10-CM

## 2018-04-24 MED FILL — REPATHA SURECLICK 140 MG/ML: 140 | 30 days supply | Qty: 2 | Fill #1

## 2018-05-07 DIAGNOSIS — M5416 Radiculopathy, lumbar region: Secondary | ICD-10-CM | POA: Diagnosis not present

## 2018-05-13 DIAGNOSIS — E7800 Pure hypercholesterolemia, unspecified: Secondary | ICD-10-CM | POA: Diagnosis not present

## 2018-05-13 DIAGNOSIS — Z789 Other specified health status: Secondary | ICD-10-CM | POA: Diagnosis not present

## 2018-05-13 DIAGNOSIS — I10 Essential (primary) hypertension: Secondary | ICD-10-CM | POA: Diagnosis not present

## 2018-05-13 DIAGNOSIS — D573 Sickle-cell trait: Secondary | ICD-10-CM | POA: Diagnosis not present

## 2018-05-22 MED FILL — REPATHA SURECLICK 140 MG/ML: 140 | 30 days supply | Qty: 2 | Fill #2

## 2018-05-27 ENCOUNTER — Other Ambulatory Visit: Payer: Self-pay | Admitting: Orthopedic Surgery

## 2018-06-03 DIAGNOSIS — M5416 Radiculopathy, lumbar region: Secondary | ICD-10-CM | POA: Diagnosis not present

## 2018-06-06 ENCOUNTER — Other Ambulatory Visit: Payer: Self-pay

## 2018-06-06 ENCOUNTER — Ambulatory Visit
Admission: RE | Admit: 2018-06-06 | Discharge: 2018-06-06 | Disposition: A | Discharge status: Home / Self Care | Payer: Medicare HMO | Source: Ambulatory Visit | Attending: Internal Medicine | Admitting: Internal Medicine

## 2018-06-06 DIAGNOSIS — Z1231 Encounter for screening mammogram for malignant neoplasm of breast: Secondary | ICD-10-CM | POA: Diagnosis not present

## 2018-06-16 NOTE — Patient Instructions (Addendum)
Trinidad Ingle BVAPOL    Your procedure is scheduled on: 06-20-2018  Report to [facility] Main  Entrance  Report to admitting at 845 AM   [place] 19 TEST ON_6/16______ @_______ , THIS TEST MUST BE DONE BEFORE SURGERY, COME TO [place]. ONCE YOUR COVID TEST IS COMPLETED, PLEASE BEGIN THE QUARANTINE INSTRUCTIONS AS OUTLINED IN YOUR HANDOUT.   Call this number if you have problems the morning of surgery [phone]    Remember:  [place], NO CHEWING GUM CANDY OR MINTS.   NO SOLID FOOD AFTER MIDNIGHT THE NIGHT PRIOR TO SURGERY. NOTHING BY MOUTH EXCEPT CLEAR LIQUIDS UNTIL 430 AM.  PLEASE FINISH ENSURE DRINK PER SURGEON ORDER 3 HOURS PRIOR TO SCHEDULED SURGERY TIME WHICH NEEDS TO BE COMPLETED AT 430 AM.     CLEAR LIQUID DIET   Foods Allowed                                                                     Foods Excluded  Coffee and tea, regular and decaf                             liquids that you cannot  Plain Jell-O in any flavor                                             see through such as: Fruit ices (not with fruit pulp)                                     milk, soups, orange juice  Iced Popsicles                                    All solid food Carbonated beverages, regular and diet                                    Cranberry, grape and apple juices Sports drinks like Gatorade Lightly seasoned clear broth or consume(fat free) Sugar, honey syrup  Sample Menu Breakfast                                Lunch                                     Supper Cranberry juice                    Beef broth  Chicken broth Jell-O                                     Grape juice                           Apple juice Coffee or tea                        Jell-O                                      Popsicle          Coffee or tea                        Coffee or tea  _____________________________________________________________________    Take these medicines the morning of surgery with A SIP OF WATER: PANTAPRAZOLE  (PROTONIX)                               You may not have any metal on your body including hair pins and              piercings  Do not wear jewelry, make-up, lotions, powders or perfumes, deodorant             Do not wear nail polish.  Do not shave  48 hours prior to surgery.             Do not bring valuables to the hospital. [place].  Contacts, dentures or bridgework may not be worn into surgery.  Leave suitcase in the car. After surgery it may be brought to your room.        _____________________________________________________________________             [facility] - Preparing for Surgery Before surgery, you can play an important role.  Because skin is not sterile, your skin needs to be as free of germs as possible.  You can reduce the number of germs on your skin by washing with CHG (chlorahexidine gluconate) soap before surgery.  CHG is an antiseptic cleaner which kills germs and bonds with the skin to continue killing germs even after washing. Please DO NOT use if you have an allergy to CHG or antibacterial soaps.  If your skin becomes reddened/irritated stop using the CHG and inform your nurse when you arrive at Short Stay. Do not shave (including legs and underarms) for at least 48 hours prior to the first CHG shower.  You may shave your face/neck. Please follow these instructions carefully:  1.  Shower with CHG Soap the night before surgery and the  morning of Surgery.  2.  If you choose to wash your hair, wash your hair first as usual with your  normal  shampoo.  3.  After you shampoo, rinse your hair and body thoroughly to remove the  shampoo.  4.  Use CHG as you would any  other liquid soap.  You can apply chg directly  to the skin and wash                       Gently with a scrungie or clean washcloth.  5.  Apply the CHG Soap to your body ONLY FROM THE NECK DOWN.   Do not use on face/ open                           Wound or open sores. Avoid contact with eyes, ears mouth and genitals (private parts).                       Wash face,  Genitals (private parts) with your normal soap.             6.  Wash thoroughly, paying special attention to the area where your surgery  will be performed.  7.  Thoroughly rinse your body with warm water from the neck down.  8.  DO NOT shower/wash with your normal soap after using and rinsing off  the CHG Soap.                9.  Pat yourself dry with a clean towel.            10.  Wear clean pajamas.            11.  Place clean sheets on your bed the night of your first shower and do not  sleep with pets. Day of Surgery : Do not apply any lotions/deodorants the morning of surgery.  Please wear clean clothes to the hospital/surgery center.  FAILURE TO FOLLOW THESE INSTRUCTIONS MAY RESULT IN THE CANCELLATION OF YOUR SURGERY PATIENT SIGNATURE_________________________________  NURSE SIGNATURE__________________________________  ________________________________________________________________________   Adam Phenix  An incentive spirometer is a tool that can help keep your lungs clear and active. This tool measures how well you are filling your lungs with each breath. Taking long deep breaths may help reverse or decrease the chance of developing breathing (pulmonary) problems (especially infection) following:  A long period of time when you are unable to move or be active. BEFORE THE PROCEDURE   If the spirometer includes an indicator to show your best effort, your nurse or respiratory therapist will set it to a desired goal.  If possible, sit up straight or lean slightly forward. Try not to slouch.  Hold the  incentive spirometer in an upright position. INSTRUCTIONS FOR USE  1. Sit on the edge of your bed if possible, or sit up as far as you can in bed or on a chair. 2. Hold the incentive spirometer in an upright position. 3. Breathe out normally. 4. Place the mouthpiece in your mouth and seal your lips tightly around it. 5. Breathe in slowly and as deeply as possible, raising the piston or the ball toward the top of the column. 6. Hold your breath for 3-5 seconds or for as long as possible. Allow the piston or ball to fall to the bottom of the column. 7. Remove the mouthpiece from your mouth and breathe out normally. 8. Rest for a few seconds and repeat Steps 1 through 7 at least 10 times every 1-2 hours when you are awake. Take your time and take a few normal breaths between deep breaths. 9. The spirometer may include an indicator to  show your best effort. Use the indicator as a goal to work toward during each repetition. 10. After each set of 10 deep breaths, practice coughing to be sure your lungs are clear. If you have an incision (the cut made at the time of surgery), support your incision when coughing by placing a pillow or rolled up towels firmly against it. Once you are able to get out of bed, walk around indoors and cough well. You may stop using the incentive spirometer when instructed by your caregiver.  RISKS AND COMPLICATIONS  Take your time so you do not get dizzy or light-headed.  If you are in pain, you may need to take or ask for pain medication before doing incentive spirometry. It is harder to take a deep breath if you are having pain. AFTER USE  Rest and breathe slowly and easily.  It can be helpful to keep track of a log of your progress. Your caregiver can provide you with a simple table to help with this. If you are using the spirometer at home, follow these instructions: [place] IF:   You are having difficultly using the spirometer.  You have trouble using  the spirometer as often as instructed.  Your pain medication is not giving enough relief while using the spirometer.  You develop fever of 100.5 F (38.1 C) or higher. SEEK IMMEDIATE MEDICAL CARE IF:   You cough up bloody sputum that had not been present before.  You develop fever of 102 F (38.9 C) or greater.  You develop worsening pain at or near the incision site. MAKE SURE YOU:   Understand these instructions.  Will watch your condition.  Will get help right away if you are not doing well or get worse. Document Released: 04/30/2006 Document Revised: 03/12/2011 Document Reviewed: 07/01/2006 ExitCare Patient Information 2014 ExitCare, [place].   ________________________________________________________________________  WHAT IS A BLOOD TRANSFUSION? Blood Transfusion Information  A transfusion is the replacement of blood or some of its parts. Blood is made up of multiple cells which provide different functions.  Red blood cells carry oxygen and are used for blood loss replacement.  White blood cells fight against infection.  Platelets control bleeding.  Plasma helps clot blood.  Other blood products are available for specialized needs, such as hemophilia or other clotting disorders. BEFORE THE TRANSFUSION  Who gives blood for transfusions?   Healthy volunteers who are fully evaluated to make sure their blood is safe. This is blood bank blood. Transfusion therapy is the safest it has ever been in the practice of medicine. Before blood is taken from a donor, a complete history is taken to make sure that person has no history of diseases nor engages in risky social behavior (examples are intravenous drug use or sexual activity with multiple partners). The donor's travel history is screened to minimize risk of transmitting infections, such as malaria. The donated blood is tested for signs of infectious diseases, such as HIV and hepatitis. The blood is then tested to be sure it  is compatible with you in order to minimize the chance of a transfusion reaction. If you or a relative donates blood, this is often done in anticipation of surgery and is not appropriate for emergency situations. It takes many days to process the donated blood. RISKS AND COMPLICATIONS Although transfusion therapy is very safe and saves many lives, the main dangers of transfusion include:   Getting an infectious disease.  Developing a transfusion reaction. This is an allergic reaction  to something in the blood you were given. Every precaution is taken to prevent this. The decision to have a blood transfusion has been considered carefully by your caregiver before blood is given. Blood is not given unless the benefits outweigh the risks. AFTER THE TRANSFUSION  Right after receiving a blood transfusion, you will usually feel much better and more energetic. This is especially true if your red blood cells have gotten low (anemic). The transfusion raises the level of the red blood cells which carry oxygen, and this usually causes an energy increase.  The nurse administering the transfusion will monitor you carefully for complications. HOME CARE INSTRUCTIONS  No special instructions are needed after a transfusion. You may find your energy is better. Speak with your caregiver about any limitations on activity for underlying diseases you may have. SEEK MEDICAL CARE IF:   Your condition is not improving after your transfusion.  You develop redness or irritation at the intravenous (IV) site. SEEK IMMEDIATE MEDICAL CARE IF:  Any of the following symptoms occur over the next 12 hours:  Shaking chills.  You have a temperature by mouth above 102 F (38.9 C), not controlled by medicine.  Chest, back, or muscle pain.  People around you feel you are not acting correctly or are confused.  Shortness of breath or difficulty breathing.  Dizziness and fainting.  You get a rash or develop hives.  You  have a decrease in urine output.  Your urine turns a dark color or changes to pink, red, or brown. Any of the following symptoms occur over the next 10 days:  You have a temperature by mouth above 102 F (38.9 C), not controlled by medicine.  Shortness of breath.  Weakness after normal activity.  The white part of the eye turns yellow (jaundice).  You have a decrease in the amount of urine or are urinating less often.  Your urine turns a dark color or changes to pink, red, or brown. Document Released: 12/16/1999 Document Revised: 03/12/2011 Document Reviewed: 08/04/2007 [facility] Patient Information 2014 [place], [place].  _______________________________________________________________________

## 2018-06-17 ENCOUNTER — Other Ambulatory Visit: Payer: Self-pay

## 2018-06-17 ENCOUNTER — Other Ambulatory Visit (HOSPITAL_COMMUNITY)
Admission: RE | Admit: 2018-06-17 | Discharge: 2018-06-17 | Disposition: A | Discharge status: Home / Self Care | Payer: Medicare HMO | Source: Ambulatory Visit | Attending: Orthopedic Surgery | Admitting: Orthopedic Surgery

## 2018-06-17 ENCOUNTER — Ambulatory Visit (HOSPITAL_COMMUNITY)
Admission: RE | Admit: 2018-06-17 | Discharge: 2018-06-17 | Disposition: A | Discharge status: Home / Self Care | Payer: Medicare HMO | Source: Ambulatory Visit | Attending: Orthopedic Surgery | Admitting: Orthopedic Surgery

## 2018-06-17 ENCOUNTER — Encounter (HOSPITAL_COMMUNITY)
Admission: RE | Admit: 2018-06-17 | Discharge: 2018-06-17 | Disposition: A | Discharge status: Home / Self Care | Payer: Medicare HMO | Source: Ambulatory Visit | Attending: Orthopedic Surgery | Admitting: Orthopedic Surgery

## 2018-06-17 DIAGNOSIS — I700 Atherosclerosis of aorta: Secondary | ICD-10-CM | POA: Diagnosis not present

## 2018-06-17 DIAGNOSIS — Z1159 Encounter for screening for other viral diseases: Secondary | ICD-10-CM | POA: Insufficient documentation

## 2018-06-17 DIAGNOSIS — M1712 Unilateral primary osteoarthritis, left knee: Secondary | ICD-10-CM | POA: Insufficient documentation

## 2018-06-17 DIAGNOSIS — Z01811 Encounter for preprocedural respiratory examination: Secondary | ICD-10-CM | POA: Diagnosis not present

## 2018-06-17 DIAGNOSIS — M419 Scoliosis, unspecified: Secondary | ICD-10-CM | POA: Insufficient documentation

## 2018-06-17 DIAGNOSIS — Z01818 Encounter for other preprocedural examination: Secondary | ICD-10-CM | POA: Diagnosis not present

## 2018-06-17 LAB — COMPREHENSIVE METABOLIC PANEL
ALT: 20 U/L (ref 0–44)
AST: 27 U/L (ref 15–41)
Albumin: 4.6 g/dL (ref 3.5–5.0)
Alkaline Phosphatase: 70 U/L (ref 38–126)
Anion gap: 13 (ref 5–15)
BUN: 15 mg/dL (ref 8–23)
CO2: 27 mmol/L (ref 22–32)
Calcium: 9.7 mg/dL (ref 8.9–10.3)
Chloride: 103 mmol/L (ref 98–111)
Creatinine, Ser: 0.93 mg/dL (ref 0.44–1.00)
GFR calc Af Amer: >60 mL/min (ref >60)
GFR calc non Af Amer: >60 mL/min (ref >60)
Glucose, Bld: 107 mg/dL — ABNORMAL HIGH (ref 70–99)
Potassium: 3.8 mmol/L (ref 3.5–5.1)
Sodium: 143 mmol/L (ref 135–145)
Total Bilirubin: 0.7 mg/dL (ref 0.3–1.2)
Total Protein: 6.8 g/dL (ref 6.5–8.1)

## 2018-06-17 LAB — CBC WITH DIFFERENTIAL/PLATELET
Abs Immature Granulocytes: 0.01 10*3/uL (ref 0.00–0.07)
Basophils Absolute: 0.1 10*3/uL (ref 0.0–0.1)
Basophils Relative: 1 %
Eosinophils Absolute: 0.2 10*3/uL (ref 0.0–0.5)
Eosinophils Relative: 3 %
HCT: 38.5 % (ref 36.0–46.0)
Hemoglobin: 12.3 g/dL (ref 12.0–15.0)
Immature Granulocytes: 0 %
Lymphocytes Relative: 18 %
Lymphs Abs: 1.1 10*3/uL (ref 0.7–4.0)
MCH: 30.1 pg (ref 26.0–34.0)
MCHC: 31.9 g/dL (ref 30.0–36.0)
MCV: 94.1 fL (ref 80.0–100.0)
Monocytes Absolute: 0.5 10*3/uL (ref 0.1–1.0)
Monocytes Relative: 9 %
Neutro Abs: 4.2 10*3/uL (ref 1.7–7.7)
Neutrophils Relative %: 69 %
Platelets: 213 10*3/uL (ref 150–400)
RBC: 4.09 MIL/uL (ref 3.87–5.11)
RDW: 14.4 % (ref 11.5–15.5)
WBC: 6 10*3/uL (ref 4.0–10.5)
nRBC: 0 % (ref 0.0–0.2)

## 2018-06-17 LAB — URINALYSIS, ROUTINE W REFLEX MICROSCOPIC
Bilirubin Urine: NEGATIVE
Glucose, UA: NEGATIVE mg/dL
Hgb urine dipstick: NEGATIVE
Ketones, ur: NEGATIVE mg/dL
Leukocytes,Ua: NEGATIVE
Nitrite: NEGATIVE
Protein, ur: NEGATIVE mg/dL
Specific Gravity, Urine: 1.005 (ref 1.005–1.030)
pH: 6 (ref 5.0–8.0)

## 2018-06-17 LAB — PROTIME-INR
INR: 0.9 (ref 0.8–1.2)
Prothrombin Time: 11.7 seconds (ref 11.4–15.2)

## 2018-06-17 LAB — SURGICAL PCR SCREEN
MRSA, PCR: NEGATIVE
Staphylococcus aureus: NEGATIVE

## 2018-06-17 LAB — APTT: aPTT: 26 seconds (ref 24–36)

## 2018-06-18 LAB — NOVEL CORONAVIRUS, NAA (HOSP ORDER, SEND-OUT TO REF LAB; TAT 18-24 HRS): SARS-CoV-2, NAA: NOT DETECTED

## 2018-06-19 MED ORDER — BUPIVACAINE LIPOSOME 1.3 % IJ SUSP
20.0000 mL | Freq: Once | INTRAMUSCULAR | Status: DC
  Filled 2018-06-19: qty 20

## 2018-06-19 NOTE — H&P (Signed)
TOTAL KNEE ADMISSION H&P  Patient is being admitted for left total knee arthroplasty.  Subjective:  Chief Complaint:left knee pain.  HPI: Donna [name] ZOXWRU, 73 y.o. female, has a history of pain and functional disability in the left knee due to arthritis and has failed non-surgical conservative treatments for greater than 12 weeks to includeNSAID's and/or analgesics, corticosteriod injections and viscosupplementation injections.  Onset of symptoms was gradual, starting 3 years ago with gradually worsening course since that time. The patient noted no past surgery on the left knee(s).  Patient currently rates pain in the left knee(s) at 9 out of 10 with activity. Patient has night pain, worsening of pain with activity and weight bearing, pain that interferes with activities of daily living, pain with passive range of motion and joint swelling.  Patient has evidence of subchondral sclerosis, periarticular osteophytes and joint space narrowing by imaging studies. This patient has had failure of all reasonable conservative care. There is no active infection.  Patient Active Problem List   Diagnosis Date Noted  . Essential hypertension 01/21/2015  . GERD (gastroesophageal reflux disease) 01/21/2015  . Degeneration of lumbar or lumbosacral intervertebral disc 01/21/2015  . Hyperlipidemia 01/21/2015  . 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 Bilateral carpal tunnel syndrome 01/05/2015   Past Medical History:  Diagnosis Date  . Arthritis   . Bulging lumbar disc   . Degeneration of lumbar or lumbosacral intervertebral disc 01/21/2015  . Essential hypertension 01/21/2015  . GERD (gastroesophageal reflux disease)   . GERD (gastroesophageal reflux disease) 01/21/2015  . Heart murmur   . Hyperlipidemia   . Hyperlipidemia 01/21/2015  . Hypertension   . Ulcerative colitis [facility])     Past Surgical History:  Procedure Laterality Date  . CESAREAN SECTION    . LAPAROTOMY Right 01/12/2015   Procedure: OPEN RIGHT  COLECTOMY;  Surgeon: Rolm Bookbinder, MD;  Location: [facility];  Service: General;  Laterality: Right;  . RIGHT COLECTOMY  01/12/2015    Current Facility-Administered Medications  Medication Dose Route Frequency Provider Last Rate Last Dose  . [START ON 06/20/2018] bupivacaine liposome (EXPAREL) 1.3 % injection 266 mg  20 mL Other Once Dorna Leitz, MD       Current Outpatient Medications  Medication Sig Dispense Refill Last Dose  . acetaminophen (TYLENOL) 500 MG tablet Take 500 mg by mouth every 6 (six) hours as needed for mild pain or headache.     Marland Kitchen amLODipine (NORVASC) 10 MG tablet Take 10 mg by mouth at bedtime.     . benazepril (LOTENSIN) 40 MG tablet Take 40 mg by mouth at bedtime.     . Calcium Carbonate-Vitamin D (CALCIUM-VITAMIN D3 PO) Take 1 tablet by mouth 2 (two) times daily.     . folic acid (FOLVITE) 045 MCG tablet Take 800 mcg by mouth daily.     . hydrochlorothiazide (HYDRODIURIL) 25 MG tablet Take 25 mg by mouth daily.     Marland Kitchen latanoprost (XALATAN) 0.005 % ophthalmic solution Place 1 drop into both eyes at bedtime.     . Menthol-Methyl Salicylate (MUSCLE RUB) 10-15 % CREA Apply 1 application topically 2 (two) times daily as needed for muscle pain.     . Multiple Vitamin (MULTIVITAMIN WITH MINERALS) TABS tablet Take 1 tablet by mouth daily.     . pantoprazole (PROTONIX) 40 MG tablet Take 40 mg by mouth 2 (two) times daily before a meal.      . REPATHA SURECLICK 409 MG/ML SOAJ Inject 140 mg into the skin every  14 (fourteen) days.     Marland Kitchen sulfaSALAzine (AZULFIDINE) 500 MG tablet Take 1,000 mg by mouth 2 (two) times daily with a meal.     . traMADol (ULTRAM) 50 MG tablet Take 50 mg by mouth See admin instructions. Take 5m every AM. Take 584mdaily if needed for pain.      Allergies  Allergen Reactions  . Ivp Dye [Iodinated Diagnostic Agents] Other (See Comments)    Feels faint  . Statins Hives and Other (See Comments)    Muscle spasms     Social History   Tobacco Use  .  Smoking status: Never Smoker  . Smokeless tobacco: Never Used  Substance Use Topics  . Alcohol use: No    No family history on file.   ROS ROS: I have reviewed the patient's review of systems thoroughly and there are no positive responses as relates to the HPI. Objective:  Physical Exam  Vital signs in last 24 hours:   Well-developed well-nourished patient in no acute distress. Alert and oriented x3 HEENT:within normal limits Cardiac: Regular rate and rhythm Pulmonary: Lungs clear to auscultation Abdomen: Soft and nontender.  Normal active bowel sounds  Musculoskeletal: (left knee: Pain to range of motion.  Limited range of motion.  No instability.  Trace effusion.  No erythema or warmth.  Mild pain to range of motion. Labs: Recent Results (from the past 2160 hour(s))  APTT     Status: None   Collection Time: 06/17/18  8:53 AM  Result Value Ref Range   aPTT 26 24 - 36 seconds    Comment: Performed at [facility] [place] [id number] WITH DIFFERENTIAL     Status: None   Collection Time: 06/17/18  8:53 AM  Result Value Ref Range   WBC 6.0 4.0 - 10.5 K/uL   RBC 4.09 3.87 - 5.11 MIL/uL   Hemoglobin 12.3 12.0 - 15.0 g/dL   HCT 38.5 36.0 - 46.0 %   MCV 94.1 80.0 - 100.0 fL   MCH 30.1 26.0 - 34.0 pg   MCHC 31.9 30.0 - 36.0 g/dL   RDW 14.4 11.5 - 15.5 %   Platelets 213 150 - 400 K/uL   nRBC 0.0 0.0 - 0.2 %   Neutrophils Relative % 69 %   Neutro Abs 4.2 1.7 - 7.7 K/uL   Lymphocytes Relative 18 %   Lymphs Abs 1.1 0.7 - 4.0 K/uL   Monocytes Relative 9 %   Monocytes Absolute 0.5 0.1 - 1.0 K/uL   Eosinophils Relative 3 %   Eosinophils Absolute 0.2 0.0 - 0.5 K/uL   Basophils Relative 1 %   Basophils Absolute 0.1 0.0 - 0.1 K/uL   Immature Granulocytes 0 %   Abs Immature Granulocytes 0.01 0.00 - 0.07 K/uL    Comment: Performed at [facility] [place] [id number] metabolic  panel     Status: Abnormal   Collection Time: 06/17/18  8:53 AM  Result Value Ref Range   Sodium 143 135 - 145 mmol/L   Potassium 3.8 3.5 - 5.1 mmol/L   Chloride 103 98 - 111 mmol/L   CO2 27 22 - 32 mmol/L   Glucose, Bld 107 (H) 70 - 99 mg/dL   BUN 15 8 - 23 mg/dL   Creatinine, Ser 0.93 0.44 - 1.00 mg/dL   Calcium 9.7 8.9 - 10.3 mg/dL   Total Protein 6.8 6.5 - 8.1 g/dL  Albumin 4.6 3.5 - 5.0 g/dL   AST 27 15 - 41 U/L   ALT 20 0 - 44 U/L   Alkaline Phosphatase 70 38 - 126 U/L   Total Bilirubin 0.7 0.3 - 1.2 mg/dL   GFR calc non Af Amer >60 >60 mL/min   GFR calc Af Amer >60 >60 mL/min   Anion gap 13 5 - 15    Comment: Performed at [facility], [place] [address] [place], [place] [address]  Protime-INR     Status: None   Collection Time: 06/17/18  8:53 AM  Result Value Ref Range   Prothrombin Time 11.7 11.4 - 15.2 seconds   INR 0.9 0.8 - 1.2    Comment: (NOTE) INR goal varies based on device and disease states. Performed at [facility], [place] [address] [place], [place] [address]   Urinalysis, Routine w reflex microscopic     Status: None   Collection Time: 06/17/18  8:53 AM  Result Value Ref Range   Color, Urine YELLOW YELLOW   APPearance CLEAR CLEAR   Specific Gravity, Urine 1.005 1.005 - 1.030   pH 6.0 5.0 - 8.0   Glucose, UA NEGATIVE NEGATIVE mg/dL   Hgb urine dipstick NEGATIVE NEGATIVE   Bilirubin Urine NEGATIVE NEGATIVE   Ketones, ur NEGATIVE NEGATIVE mg/dL   Protein, ur NEGATIVE NEGATIVE mg/dL   Nitrite NEGATIVE NEGATIVE   Leukocytes,Ua NEGATIVE NEGATIVE    Comment: Performed at [place] [address], [place], [place] [address]  Surgical pcr screen     Status: None   Collection Time: 06/17/18  8:53 AM   Specimen: Nasal Mucosa; Nasal Swab  Result Value Ref Range   MRSA, PCR NEGATIVE NEGATIVE   Staphylococcus aureus NEGATIVE NEGATIVE    Comment: (NOTE) The Xpert SA Assay (FDA approved for NASAL  specimens in patients 67 years of age and older), is one component of a comprehensive surveillance program. It is not intended to diagnose infection nor to guide or monitor treatment. Performed at [facility], [place] [address], [place], [place] [address]   Novel Coronavirus, NAA (hospital order; send-out to ref lab)     Status: None   Collection Time: 06/17/18 10:06 AM   Specimen: Nasopharyngeal Swab; Respiratory  Result Value Ref Range   SARS-CoV-2, NAA NOT DETECTED NOT DETECTED    Comment: (NOTE) This test was developed and its performance characteristics determined by [employer]. This test has not been FDA cleared or approved. This test has been authorized by FDA under an Emergency Use Authorization (EUA). This test is only authorized for the duration of time the declaration that circumstances exist justifying the authorization of the emergency use of in vitro diagnostic tests for detection of SARS-CoV-2 virus and/or diagnosis of COVID-19 infection under section 564(b)(1) of the Act, 21 U.S.C. 967ELF-8(B)(0), unless the authorization is terminated or revoked sooner. When diagnostic testing is negative, the possibility of a false negative result should be considered in the context of a patient's recent exposures and the presence of clinical signs and symptoms consistent with COVID-19. An individual without symptoms of COVID-19 and who is not shedding SARS-CoV-2 virus would expect to have a negative (not detected) result in this assay. Performed  At: [facility] [place], [place] [id number] Rush Farmer MD ID:[id number]    Coronavirus Source NASOPHARYNGEAL     Comment: Performed at [facility], [place] [address] [place], [place] [address]    Estimated  body mass index is 33.53 kg/m as calculated from the following:   Height as of 06/17/18: 5' 3"  (1.6 m).   Weight as of 06/17/18: 85.9 kg.   Imaging Review Plain  radiographs demonstrate severe degenerative joint disease of the left knee(s). The overall alignment ismild varus. The bone quality appears to be fair for age and reported activity level.      Assessment/Plan:  End stage arthritis, left knee   The patient history, physical examination, clinical judgment of the provider and imaging studies are consistent with end stage degenerative joint disease of the left knee(s) and total knee arthroplasty is deemed medically necessary. The treatment options including medical management, injection therapy arthroscopy and arthroplasty were discussed at length. The risks and benefits of total knee arthroplasty were presented and reviewed. The risks due to aseptic loosening, infection, stiffness, patella tracking problems, thromboembolic complications and other imponderables were discussed. The patient acknowledged the explanation, agreed to proceed with the plan and consent was signed. Patient is being admitted for inpatient treatment for surgery, pain control, PT, OT, prophylactic antibiotics, VTE prophylaxis, progressive ambulation and ADL's and discharge planning. The patient is planning to be discharged home with home health services

## 2018-06-20 ENCOUNTER — Encounter (HOSPITAL_COMMUNITY)
Admission: RE | Disposition: A | Discharge status: Home Health | Payer: Self-pay | Source: Home / Self Care | Attending: Orthopedic Surgery

## 2018-06-20 ENCOUNTER — Ambulatory Visit (HOSPITAL_COMMUNITY)
Admission: RE | Admit: 2018-06-20 | Discharge: 2018-06-21 | Disposition: A | Discharge status: Home Health | Payer: Medicare HMO | Attending: Orthopedic Surgery | Admitting: Orthopedic Surgery

## 2018-06-20 ENCOUNTER — Other Ambulatory Visit: Payer: Self-pay

## 2018-06-20 ENCOUNTER — Ambulatory Visit (HOSPITAL_COMMUNITY): Payer: Medicare HMO | Admitting: Physician Assistant

## 2018-06-20 ENCOUNTER — Ambulatory Visit (HOSPITAL_COMMUNITY): Payer: Medicare HMO | Admitting: Anesthesiology

## 2018-06-20 ENCOUNTER — Encounter (HOSPITAL_COMMUNITY): Payer: Self-pay | Admitting: Certified Registered"

## 2018-06-20 DIAGNOSIS — I10 Essential (primary) hypertension: Secondary | ICD-10-CM | POA: Insufficient documentation

## 2018-06-20 DIAGNOSIS — G8918 Other acute postprocedural pain: Secondary | ICD-10-CM | POA: Diagnosis not present

## 2018-06-20 DIAGNOSIS — K219 Gastro-esophageal reflux disease without esophagitis: Secondary | ICD-10-CM | POA: Diagnosis not present

## 2018-06-20 DIAGNOSIS — Z79899 Other long term (current) drug therapy: Secondary | ICD-10-CM | POA: Diagnosis not present

## 2018-06-20 DIAGNOSIS — E785 Hyperlipidemia, unspecified: Secondary | ICD-10-CM | POA: Diagnosis not present

## 2018-06-20 DIAGNOSIS — Z9049 Acquired absence of other specified parts of digestive tract: Secondary | ICD-10-CM | POA: Diagnosis not present

## 2018-06-20 DIAGNOSIS — M1712 Unilateral primary osteoarthritis, left knee: Secondary | ICD-10-CM | POA: Diagnosis not present

## 2018-06-20 DIAGNOSIS — M25562 Pain in left knee: Secondary | ICD-10-CM | POA: Diagnosis present

## 2018-06-20 HISTORY — PX: TOTAL KNEE ARTHROPLASTY: SHX125

## 2018-06-20 SURGERY — ARTHROPLASTY, KNEE, TOTAL
Anesthesia: Spinal | Site: Knee | Laterality: Left

## 2018-06-20 MED ORDER — CHLORHEXIDINE GLUCONATE 4 % EX LIQD: 60.0000 mL | Freq: Once | CUTANEOUS | Status: DC

## 2018-06-20 MED ORDER — HYDROMORPHONE HCL 1 MG/ML IJ SOLN
INTRAMUSCULAR | Status: AC
  Filled 2018-06-20: qty 2

## 2018-06-20 MED ORDER — GABAPENTIN 300 MG PO CAPS
300.0000 mg | ORAL_CAPSULE | Freq: Two times a day (BID) | ORAL | Status: DC
  Administered 2018-06-20 – 2018-06-21 (×2): 300 mg via ORAL
  Filled 2018-06-20 (×2): qty 1

## 2018-06-20 MED ORDER — LACTATED RINGERS IV SOLN
INTRAVENOUS | Status: DC
  Administered 2018-06-20 (×2): via INTRAVENOUS

## 2018-06-20 MED ORDER — ACETAMINOPHEN 325 MG PO TABS: 325.0000 mg | ORAL_TABLET | Freq: Four times a day (QID) | ORAL | Status: DC | PRN

## 2018-06-20 MED ORDER — POLYETHYLENE GLYCOL 3350 17 G PO PACK: 17.0000 g | PACK | Freq: Every day | ORAL | Status: DC | PRN

## 2018-06-20 MED ORDER — ONDANSETRON HCL 4 MG/2ML IJ SOLN
4.0000 mg | Freq: Four times a day (QID) | INTRAMUSCULAR | Status: DC | PRN
  Administered 2018-06-20: 4 mg via INTRAVENOUS
  Filled 2018-06-20: qty 2

## 2018-06-20 MED ORDER — CELECOXIB 200 MG PO CAPS
200.0000 mg | ORAL_CAPSULE | Freq: Two times a day (BID) | ORAL | Status: DC
  Administered 2018-06-20 – 2018-06-21 (×2): 200 mg via ORAL
  Filled 2018-06-20 (×2): qty 1

## 2018-06-20 MED ORDER — METHOCARBAMOL 500 MG PO TABS
500.0000 mg | ORAL_TABLET | Freq: Four times a day (QID) | ORAL | Status: DC | PRN
  Administered 2018-06-20 – 2018-06-21 (×2): 500 mg via ORAL
  Filled 2018-06-20 (×2): qty 1

## 2018-06-20 MED ORDER — ALUM & MAG HYDROXIDE-SIMETH 200-200-20 MG/5ML PO SUSP: 30.0000 mL | ORAL | Status: DC | PRN

## 2018-06-20 MED ORDER — PANTOPRAZOLE SODIUM 40 MG PO TBEC
40.0000 mg | DELAYED_RELEASE_TABLET | Freq: Two times a day (BID) | ORAL | Status: DC
  Administered 2018-06-20 – 2018-06-21 (×2): 40 mg via ORAL
  Filled 2018-06-20 (×2): qty 1

## 2018-06-20 MED ORDER — CEFAZOLIN SODIUM-DEXTROSE 2-4 GM/100ML-% IV SOLN
2.0000 g | INTRAVENOUS | Status: AC
  Administered 2018-06-20: 2 g via INTRAVENOUS
  Filled 2018-06-20: qty 100

## 2018-06-20 MED ORDER — FENTANYL CITRATE (PF) 100 MCG/2ML IJ SOLN: 100.0000 ug | Freq: Once | INTRAMUSCULAR | Status: DC

## 2018-06-20 MED ORDER — TRANEXAMIC ACID-NACL 1000-0.7 MG/100ML-% IV SOLN
1000.0000 mg | Freq: Once | INTRAVENOUS | Status: AC
  Administered 2018-06-20: 1000 mg via INTRAVENOUS
  Filled 2018-06-20: qty 100

## 2018-06-20 MED ORDER — OXYCODONE HCL 5 MG PO TABS
5.0000 mg | ORAL_TABLET | ORAL | Status: DC | PRN
  Administered 2018-06-20 – 2018-06-21 (×4): 10 mg via ORAL
  Filled 2018-06-20 (×4): qty 2

## 2018-06-20 MED ORDER — MIDAZOLAM HCL 2 MG/2ML IJ SOLN: 2.0000 mg | Freq: Once | INTRAMUSCULAR | Status: DC

## 2018-06-20 MED ORDER — SODIUM CHLORIDE 0.9 % IV SOLN
INTRAVENOUS | Status: DC
  Administered 2018-06-20 – 2018-06-21 (×2): via INTRAVENOUS

## 2018-06-20 MED ORDER — PROPOFOL 500 MG/50ML IV EMUL
INTRAVENOUS | Status: DC | PRN
  Administered 2018-06-20: 100 ug/kg/min via INTRAVENOUS

## 2018-06-20 MED ORDER — DEXAMETHASONE SODIUM PHOSPHATE 10 MG/ML IJ SOLN
10.0000 mg | Freq: Two times a day (BID) | INTRAMUSCULAR | Status: AC
  Administered 2018-06-20 – 2018-06-21 (×3): 10 mg via INTRAVENOUS
  Filled 2018-06-20 (×3): qty 1

## 2018-06-20 MED ORDER — DIPHENHYDRAMINE HCL 12.5 MG/5ML PO ELIX: 12.5000 mg | ORAL_SOLUTION | ORAL | Status: DC | PRN

## 2018-06-20 MED ORDER — BENAZEPRIL HCL 20 MG PO TABS
40.0000 mg | ORAL_TABLET | Freq: Every day | ORAL | Status: DC
  Administered 2018-06-20: 40 mg via ORAL
  Filled 2018-06-20: qty 2

## 2018-06-20 MED ORDER — SODIUM CHLORIDE (PF) 0.9 % IJ SOLN
INTRAMUSCULAR | Status: AC
  Filled 2018-06-20: qty 50

## 2018-06-20 MED ORDER — LATANOPROST 0.005 % OP SOLN
1.0000 [drp] | Freq: Every day | OPHTHALMIC | Status: DC
  Administered 2018-06-20: 1 [drp] via OPHTHALMIC
  Filled 2018-06-20: qty 2.5

## 2018-06-20 MED ORDER — FENTANYL CITRATE (PF) 100 MCG/2ML IJ SOLN: 25.0000 ug | INTRAMUSCULAR | Status: DC | PRN

## 2018-06-20 MED ORDER — SUGAMMADEX SODIUM 500 MG/5ML IV SOLN
INTRAVENOUS | Status: AC
  Filled 2018-06-20: qty 5

## 2018-06-20 MED ORDER — BUPIVACAINE LIPOSOME 1.3 % IJ SUSP
INTRAMUSCULAR | Status: DC | PRN
  Administered 2018-06-20: 20 mL

## 2018-06-20 MED ORDER — BUPIVACAINE-EPINEPHRINE (PF) 0.25% -1:200000 IJ SOLN
INTRAMUSCULAR | Status: DC | PRN
  Administered 2018-06-20: 20 mL via PERINEURAL

## 2018-06-20 MED ORDER — MAGNESIUM CITRATE PO SOLN: 1.0000 | Freq: Once | ORAL | Status: DC | PRN

## 2018-06-20 MED ORDER — PROPOFOL 10 MG/ML IV BOLUS
INTRAVENOUS | Status: DC | PRN
  Administered 2018-06-20 (×2): 20 mg via INTRAVENOUS

## 2018-06-20 MED ORDER — DOCUSATE SODIUM 100 MG PO CAPS
100.0000 mg | ORAL_CAPSULE | Freq: Two times a day (BID) | ORAL | Status: DC
  Administered 2018-06-20 – 2018-06-21 (×2): 100 mg via ORAL
  Filled 2018-06-20 (×2): qty 1

## 2018-06-20 MED ORDER — CEFAZOLIN SODIUM-DEXTROSE 2-4 GM/100ML-% IV SOLN
2.0000 g | Freq: Four times a day (QID) | INTRAVENOUS | Status: AC
  Administered 2018-06-20 (×2): 2 g via INTRAVENOUS
  Filled 2018-06-20 (×2): qty 100

## 2018-06-20 MED ORDER — ONDANSETRON HCL 4 MG/2ML IJ SOLN
INTRAMUSCULAR | Status: DC | PRN
  Administered 2018-06-20: 4 mg via INTRAVENOUS

## 2018-06-20 MED ORDER — ONDANSETRON HCL 4 MG PO TABS: 4.0000 mg | ORAL_TABLET | Freq: Four times a day (QID) | ORAL | Status: DC | PRN

## 2018-06-20 MED ORDER — ASPIRIN EC 325 MG PO TBEC: 325.0000 mg | DELAYED_RELEASE_TABLET | Freq: Two times a day (BID) | ORAL | 0 refills | Status: DC

## 2018-06-20 MED ORDER — FENTANYL CITRATE (PF) 100 MCG/2ML IJ SOLN
50.0000 ug | INTRAMUSCULAR | Status: DC
  Administered 2018-06-20: 50 ug via INTRAVENOUS
  Filled 2018-06-20: qty 2

## 2018-06-20 MED ORDER — HYDROMORPHONE HCL 1 MG/ML IJ SOLN
0.2500 mg | INTRAMUSCULAR | Status: DC | PRN
  Administered 2018-06-20 (×4): 0.5 mg via INTRAVENOUS

## 2018-06-20 MED ORDER — FENTANYL CITRATE (PF) 100 MCG/2ML IJ SOLN
25.0000 ug | INTRAMUSCULAR | Status: DC | PRN
  Administered 2018-06-20 (×3): 50 ug via INTRAVENOUS

## 2018-06-20 MED ORDER — HYDROCHLOROTHIAZIDE 25 MG PO TABS
25.0000 mg | ORAL_TABLET | Freq: Every day | ORAL | Status: DC
  Administered 2018-06-21: 25 mg via ORAL
  Filled 2018-06-20 (×2): qty 1

## 2018-06-20 MED ORDER — AMLODIPINE BESYLATE 10 MG PO TABS
10.0000 mg | ORAL_TABLET | Freq: Every day | ORAL | Status: DC
  Administered 2018-06-20: 10 mg via ORAL
  Filled 2018-06-20: qty 1

## 2018-06-20 MED ORDER — BUPIVACAINE-EPINEPHRINE 0.25% -1:200000 IJ SOLN
INTRAMUSCULAR | Status: DC | PRN
  Administered 2018-06-20: 30 mL

## 2018-06-20 MED ORDER — SODIUM CHLORIDE 0.9 % IR SOLN
Status: DC | PRN
  Administered 2018-06-20: 1000 mL

## 2018-06-20 MED ORDER — ASPIRIN EC 325 MG PO TBEC
325.0000 mg | DELAYED_RELEASE_TABLET | Freq: Two times a day (BID) | ORAL | Status: DC
  Administered 2018-06-20 – 2018-06-21 (×2): 325 mg via ORAL
  Filled 2018-06-20 (×2): qty 1

## 2018-06-20 MED ORDER — PROPOFOL 10 MG/ML IV BOLUS
INTRAVENOUS | Status: AC
  Filled 2018-06-20: qty 40

## 2018-06-20 MED ORDER — DOCUSATE SODIUM 100 MG PO CAPS: 100.0000 mg | ORAL_CAPSULE | Freq: Two times a day (BID) | ORAL | 0 refills | Status: DC

## 2018-06-20 MED ORDER — ONDANSETRON HCL 4 MG/2ML IJ SOLN
INTRAMUSCULAR | Status: AC
  Filled 2018-06-20: qty 2

## 2018-06-20 MED ORDER — POVIDONE-IODINE 10 % EX SWAB
2.0000 "application " | Freq: Once | CUTANEOUS | Status: AC
  Administered 2018-06-20: 2 via TOPICAL

## 2018-06-20 MED ORDER — TRANEXAMIC ACID-NACL 1000-0.7 MG/100ML-% IV SOLN
INTRAVENOUS | Status: DC | PRN
  Administered 2018-06-20: 1000 mg via INTRAVENOUS

## 2018-06-20 MED ORDER — TIZANIDINE HCL 2 MG PO TABS: 2.0000 mg | ORAL_TABLET | Freq: Three times a day (TID) | ORAL | 0 refills | Status: DC | PRN

## 2018-06-20 MED ORDER — METHOCARBAMOL 500 MG IVPB - SIMPLE MED
500.0000 mg | Freq: Four times a day (QID) | INTRAVENOUS | Status: DC | PRN
  Administered 2018-06-20: 500 mg via INTRAVENOUS
  Filled 2018-06-20: qty 50

## 2018-06-20 MED ORDER — SODIUM CHLORIDE 0.9% FLUSH
INTRAVENOUS | Status: DC | PRN
  Administered 2018-06-20: 50 mL via INTRAVENOUS

## 2018-06-20 MED ORDER — HYDROMORPHONE HCL 1 MG/ML IJ SOLN: 0.5000 mg | INTRAMUSCULAR | Status: DC | PRN

## 2018-06-20 MED ORDER — PROMETHAZINE HCL 25 MG/ML IJ SOLN
12.5000 mg | Freq: Four times a day (QID) | INTRAMUSCULAR | Status: DC | PRN
  Administered 2018-06-20: 12.5 mg via INTRAVENOUS
  Filled 2018-06-20: qty 1

## 2018-06-20 MED ORDER — LIDOCAINE 2% (20 MG/ML) 5 ML SYRINGE
INTRAMUSCULAR | Status: AC
  Filled 2018-06-20: qty 5

## 2018-06-20 MED ORDER — MIDAZOLAM HCL 2 MG/2ML IJ SOLN
1.0000 mg | INTRAMUSCULAR | Status: DC
  Administered 2018-06-20: 1 mg via INTRAVENOUS
  Filled 2018-06-20: qty 2

## 2018-06-20 MED ORDER — METHOCARBAMOL 500 MG IVPB - SIMPLE MED
INTRAVENOUS | Status: AC
  Filled 2018-06-20: qty 50

## 2018-06-20 MED ORDER — TRANEXAMIC ACID-NACL 1000-0.7 MG/100ML-% IV SOLN
INTRAVENOUS | Status: AC
  Filled 2018-06-20: qty 100

## 2018-06-20 MED ORDER — BUPIVACAINE-EPINEPHRINE (PF) 0.25% -1:200000 IJ SOLN
INTRAMUSCULAR | Status: AC
  Filled 2018-06-20: qty 30

## 2018-06-20 MED ORDER — OXYCODONE-ACETAMINOPHEN 5-325 MG PO TABS: 1.0000 | ORAL_TABLET | Freq: Four times a day (QID) | ORAL | 0 refills | Status: DC | PRN

## 2018-06-20 MED ORDER — BUPIVACAINE IN DEXTROSE 0.75-8.25 % IT SOLN
INTRATHECAL | Status: DC | PRN
  Administered 2018-06-20: 1.6 mL via INTRATHECAL

## 2018-06-20 MED ORDER — PROPOFOL 10 MG/ML IV BOLUS
INTRAVENOUS | Status: AC
  Filled 2018-06-20: qty 20

## 2018-06-20 MED ORDER — LIDOCAINE 2% (20 MG/ML) 5 ML SYRINGE
INTRAMUSCULAR | Status: DC | PRN
  Administered 2018-06-20: 100 mg via INTRAVENOUS

## 2018-06-20 MED ORDER — WATER FOR IRRIGATION, STERILE IR SOLN
Status: DC | PRN
  Administered 2018-06-20: 2000 mL

## 2018-06-20 MED ORDER — FENTANYL CITRATE (PF) 100 MCG/2ML IJ SOLN
INTRAMUSCULAR | Status: AC
  Filled 2018-06-20: qty 4

## 2018-06-20 MED ORDER — BISACODYL 5 MG PO TBEC: 5.0000 mg | DELAYED_RELEASE_TABLET | Freq: Every day | ORAL | Status: DC | PRN

## 2018-06-20 SURGICAL SUPPLY — 54 items
APL SKNCLS STERI-STRIP NONHPOA (GAUZE/BANDAGES/DRESSINGS) ×1
ATTUNE PS FEM LT SZ 4 CEM KNEE (Femur) ×1 IMPLANT
ATTUNE PSRP INSR SZ4 10 KNEE (Insert) ×1 IMPLANT
BAG SPEC THK2 15X12 ZIP CLS (MISCELLANEOUS) ×1
BAG ZIPLOCK 12X15 (MISCELLANEOUS) ×2 IMPLANT
BANDAGE ACE 6X5 VEL STRL LF (GAUZE/BANDAGES/DRESSINGS) ×2 IMPLANT
BASEPLATE TIBIAL ROTATING SZ 4 (Knees) ×1 IMPLANT
BENZOIN TINCTURE PRP APPL 2/3 (GAUZE/BANDAGES/DRESSINGS) ×2 IMPLANT
BLADE SAGITTAL 25.0X1.19X90 (BLADE) ×2 IMPLANT
BLADE SAW SGTL 11.0X1.19X90.0M (BLADE) ×2 IMPLANT
BLADE SURG SZ10 CARB STEEL (BLADE) ×4 IMPLANT
BNDG CMPR MED 15X6 ELC VLCR LF (GAUZE/BANDAGES/DRESSINGS) ×1
BNDG ELASTIC 6X15 VLCR STRL LF (GAUZE/BANDAGES/DRESSINGS) ×1 IMPLANT
BOOTIES KNEE HIGH SLOAN (MISCELLANEOUS) ×2 IMPLANT
BOWL SMART MIX CTS (DISPOSABLE) ×2 IMPLANT
BSPLAT TIB 4 CMNT ROT PLAT STR (Knees) ×1 IMPLANT
CEMENT HV SMART SET (Cement) ×4 IMPLANT
CLSR STERI-STRIP ANTIMIC 1/2X4 (GAUZE/BANDAGES/DRESSINGS) ×1 IMPLANT
COVER SURGICAL LIGHT HANDLE (MISCELLANEOUS) ×2 IMPLANT
COVER WAND RF STERILE (DRAPES) IMPLANT
CUFF TOURN SGL QUICK 34 (TOURNIQUET CUFF) ×2
CUFF TRNQT CYL 34X4.125X (TOURNIQUET CUFF) ×1 IMPLANT
DECANTER SPIKE VIAL GLASS SM (MISCELLANEOUS) IMPLANT
DRAPE U-SHAPE 47X51 STRL (DRAPES) ×2 IMPLANT
DRSG AQUACEL AG ADV 3.5X10 (GAUZE/BANDAGES/DRESSINGS) ×2 IMPLANT
DURAPREP 26ML APPLICATOR (WOUND CARE) ×2 IMPLANT
ELECT REM PT RETURN 15FT ADLT (MISCELLANEOUS) ×2 IMPLANT
GLOVE BIOGEL PI IND STRL 8 (GLOVE) ×2 IMPLANT
GLOVE BIOGEL PI INDICATOR 8 (GLOVE) ×2
GLOVE ECLIPSE 7.5 STRL STRAW (GLOVE) ×4 IMPLANT
GOWN STRL REUS W/TWL XL LVL3 (GOWN DISPOSABLE) ×4 IMPLANT
HANDPIECE INTERPULSE COAX TIP (DISPOSABLE) ×2
HOLDER FOLEY CATH W/STRAP (MISCELLANEOUS) IMPLANT
HOOD PEEL AWAY FLYTE STAYCOOL (MISCELLANEOUS) ×6 IMPLANT
KIT TURNOVER KIT A (KITS) IMPLANT
MANIFOLD NEPTUNE II (INSTRUMENTS) ×2 IMPLANT
NEEDLE HYPO 22GX1.5 SAFETY (NEEDLE) ×2 IMPLANT
NS IRRIG 1000ML POUR BTL (IV SOLUTION) ×2 IMPLANT
PACK ICE MAXI GEL EZY WRAP (MISCELLANEOUS) ×2 IMPLANT
PACK TOTAL KNEE CUSTOM (KITS) ×2 IMPLANT
PADDING CAST COTTON 6X4 STRL (CAST SUPPLIES) ×2 IMPLANT
PATELLA MEDIAL ATTUN 35MM KNEE (Knees) ×1 IMPLANT
PIN STEINMAN FIXATION KNEE (PIN) ×1 IMPLANT
PIN THREADED HEADED SIGMA (PIN) ×1 IMPLANT
PROTECTOR NERVE ULNAR (MISCELLANEOUS) ×2 IMPLANT
SET HNDPC FAN SPRY TIP SCT (DISPOSABLE) ×1 IMPLANT
STRIP CLOSURE SKIN 1/2X4 (GAUZE/BANDAGES/DRESSINGS) IMPLANT
SUT MNCRL AB 3-0 PS2 18 (SUTURE) ×2 IMPLANT
SUT VIC AB 0 CT1 36 (SUTURE) ×2 IMPLANT
SUT VIC AB 1 CT1 36 (SUTURE) ×4 IMPLANT
SYR CONTROL 10ML LL (SYRINGE) ×4 IMPLANT
TRAY FOLEY MTR SLVR 16FR STAT (SET/KITS/TRAYS/PACK) ×2 IMPLANT
WATER STERILE IRR 1000ML POUR (IV SOLUTION) ×4 IMPLANT
YANKAUER SUCT BULB TIP 10FT TU (MISCELLANEOUS) ×2 IMPLANT

## 2018-06-20 NOTE — Progress Notes (Signed)
Assisted Dr. Rodman Comp with left, ultrasound guided, adductor canal block. Side rails up, monitors on throughout procedure. See vital signs in flow sheet. Tolerated Procedure well.

## 2018-06-20 NOTE — Plan of Care (Signed)
  Problem: Education: Goal: Knowledge of the prescribed therapeutic regimen will improve Outcome: Progressing Goal: Individualized Educational Video(s) Outcome: Progressing   Problem: Activity: Goal: Ability to avoid complications of mobility impairment will improve Outcome: Progressing Goal: Range of joint motion will improve Outcome: Progressing   Problem: Clinical Measurements: Goal: Postoperative complications will be avoided or minimized Outcome: Progressing   Problem: Pain Management: Goal: Pain level will decrease with appropriate interventions Outcome: Progressing   Problem: Skin Integrity: Goal: Will show signs of wound healing Outcome: Progressing   Problem: Education: Goal: Knowledge of General Education information will improve Description: Including pain rating scale, medication(s)/side effects and non-pharmacologic comfort measures Outcome: Progressing   Problem: Health Behavior/Discharge Planning: Goal: Ability to manage health-related needs will improve Outcome: Progressing   Problem: Clinical Measurements: Goal: Ability to maintain clinical measurements within normal limits will improve Outcome: Progressing Goal: Will remain free from infection Outcome: Progressing Goal: Diagnostic test results will improve Outcome: Progressing Goal: Respiratory complications will improve Outcome: Progressing Goal: Cardiovascular complication will be avoided Outcome: Progressing   Problem: Activity: Goal: Risk for activity intolerance will decrease Outcome: Progressing   Problem: Nutrition: Goal: Adequate nutrition will be maintained Outcome: Progressing   Problem: Coping: Goal: Level of anxiety will decrease Outcome: Progressing   Problem: Elimination: Goal: Will not experience complications related to bowel motility Outcome: Progressing Goal: Will not experience complications related to urinary retention Outcome: Progressing   Problem: Pain  Managment: Goal: General experience of comfort will improve Outcome: Progressing   Problem: Safety: Goal: Ability to remain free from injury will improve Outcome: Progressing   Problem: Skin Integrity: Goal: Risk for impaired skin integrity will decrease Outcome: Progressing  Plan of care discussed with patient.

## 2018-06-20 NOTE — Anesthesia Procedure Notes (Signed)
Anesthesia Regional Block: Adductor canal block   Pre-Anesthetic Checklist: ,, timeout performed, Correct Patient, Correct Site, Correct Laterality, Correct Procedure, Correct Position, site marked, Risks and benefits discussed,  Surgical consent,  Pre-op evaluation,  At surgeon's request and post-op pain management  Laterality: Left  Prep: chloraprep       Needles:  Injection technique: Single-shot  Needle Type: Echogenic Needle     Needle Length: 9cm  Needle Gauge: 21     Additional Needles:   Procedures:,,,, ultrasound used (permanent image in chart),,,,  Narrative:  Start time: 06/20/2018 9:20 AM End time: 06/20/2018 9:25 AM Injection made incrementally with aspirations every 5 mL.  Performed by: Personally  Anesthesiologist: Suzette Battiest, MD

## 2018-06-20 NOTE — Interval H&P Note (Signed)
History and Physical Interval Note:  06/20/2018 8:44 AM  Donna [name]  has presented today for surgery, with the diagnosis of LEFT KNEE OSTEOARTHRITIS.  The various methods of treatment have been discussed with the patient and family. After consideration of risks, benefits and other options for treatment, the patient has consented to  Procedure(s): TOTAL KNEE ARTHROPLASTY (Left) as a surgical intervention.  The patient's history has been reviewed, patient examined, no change in status, stable for surgery.  I have reviewed the patient's chart and labs.  Questions were answered to the patient's satisfaction.     Alta Corning

## 2018-06-20 NOTE — Discharge Instructions (Signed)

## 2018-06-20 NOTE — Anesthesia Preprocedure Evaluation (Signed)
Anesthesia Evaluation  Patient identified by MRN, date of birth, ID band Patient awake    Reviewed: Allergy & Precautions, NPO status , Patient's Chart, lab work & pertinent test results  Airway Mallampati: II  TM Distance: >3 FB Neck ROM: Full    Dental  (+) Dental Advisory Given   Pulmonary neg pulmonary ROS,    breath sounds clear to auscultation       Cardiovascular hypertension, Pt. on medications  Rhythm:Regular Rate:Normal     Neuro/Psych  Neuromuscular disease    GI/Hepatic Neg liver ROS, PUD, GERD  ,  Endo/Other  negative endocrine ROS  Renal/GU negative Renal ROS     Musculoskeletal  (+) Arthritis ,   Abdominal   Peds  Hematology negative hematology ROS (+)   Anesthesia Other Findings   Reproductive/Obstetrics                             Lab Results  Component Value Date   WBC 6.0 06/17/2018   HGB 12.3 06/17/2018   HCT 38.5 06/17/2018   MCV 94.1 06/17/2018   PLT 213 06/17/2018   Lab Results  Component Value Date   CREATININE 0.93 06/17/2018   BUN 15 06/17/2018   NA 143 06/17/2018   K 3.8 06/17/2018   CL 103 06/17/2018   CO2 27 06/17/2018    Anesthesia Physical Anesthesia Plan  ASA: II  Anesthesia Plan: Spinal   Post-op Pain Management:  Regional for Post-op pain   Induction: Intravenous  PONV Risk Score and Plan: 2 and Propofol infusion, Ondansetron and Treatment may vary due to age or medical condition  Airway Management Planned: Natural Airway and Simple Face Mask  Additional Equipment: None  Intra-op Plan:   Post-operative Plan:   Informed Consent: I have reviewed the patients History and Physical, chart, labs and discussed the procedure including the risks, benefits and alternatives for the proposed anesthesia with the patient or authorized representative who has indicated his/her understanding and acceptance.     Dental advisory given  Plan  Discussed with: CRNA  Anesthesia Plan Comments:         Anesthesia Quick Evaluation

## 2018-06-20 NOTE — Brief Op Note (Signed)
06/20/2018  4:13 PM  PATIENT:  Donna [name]  73 y.o. female  PRE-OPERATIVE DIAGNOSIS:  LEFT KNEE OSTEOARTHRITIS  POST-OPERATIVE DIAGNOSIS:  LEFT KNEE OSTEOARTHRITIS  PROCEDURE:  Procedure(s): TOTAL KNEE ARTHROPLASTY (Left)  SURGEON:  Surgeon(s) and Role:    Dorna Leitz, MD - Primary  PHYSICIAN ASSISTANT:   ASSISTANTS: jiom bethune   ANESTHESIA:   spinal  EBL:  100 mL   BLOOD ADMINISTERED:none  DRAINS: none   LOCAL MEDICATIONS USED:  MARCAINE    and OTHER experel  SPECIMEN:  No Specimen  DISPOSITION OF SPECIMEN:  N/A  COUNTS:  YES  TOURNIQUET:   Total Tourniquet Time Documented: Thigh (Left) - 43 minutes Total: Thigh (Left) - 43 minutes   DICTATION: .Other Dictation: Dictation Number [id number]  PLAN OF CARE: Admit for overnight observation  PATIENT DISPOSITION:  PACU - hemodynamically stable.   Delay start of Pharmacological VTE agent (>24hrs) due to surgical blood loss or risk of bleeding: no

## 2018-06-20 NOTE — Anesthesia Procedure Notes (Signed)
Spinal  Patient location during procedure: OR End time: 06/20/2018 9:55 AM Staffing Resident/CRNA: Noralyn Pick D, CRNA Performed: anesthesiologist and resident/CRNA  Preanesthetic Checklist Completed: patient identified, site marked, surgical consent, pre-op evaluation, timeout performed, IV checked, risks and benefits discussed and monitors and equipment checked Spinal Block Patient position: sitting Prep: Betadine Patient monitoring: heart rate, continuous pulse ox and blood pressure Approach: midline Location: L3-4 Injection technique: single-shot Needle Needle type: Sprotte  Needle gauge: 24 G Needle length: 9 cm Assessment Sensory level: T6 Additional Notes Expiration date of kit checked and confirmed. Patient tolerated procedure well, without complications.

## 2018-06-20 NOTE — Anesthesia Postprocedure Evaluation (Signed)
Anesthesia Post Note  Patient: Donna [name]  Procedure(s) Performed: TOTAL KNEE ARTHROPLASTY (Left Knee)     Patient location during evaluation: PACU Anesthesia Type: Spinal Level of consciousness: oriented and awake and alert Pain management: pain level controlled Vital Signs Assessment: post-procedure vital signs reviewed and stable Respiratory status: spontaneous breathing, respiratory function stable and patient connected to nasal cannula oxygen Cardiovascular status: blood pressure returned to baseline and stable Postop Assessment: no headache, no backache and no apparent nausea or vomiting Anesthetic complications: no    Last Vitals:  Vitals:   06/20/18 1303 06/20/18 1407  BP: (!) 154/64 (!) 142/67  Pulse: 74 73  Resp: 16 18  Temp:  36.6 C  SpO2: 96% 99%    Last Pain:  Vitals:   06/20/18 1442  TempSrc:   PainSc: 4                  Tiajuana Amass

## 2018-06-20 NOTE — Op Note (Signed)
NAME: Donna [name], Donna [name] MEDICAL RECORD [id number] ACCOUNT [id number] DATE OF BIRTH:12/08/1945 FACILITY: [facility] LOCATION: [facility] PHYSICIAN:Jalaysha Skilton L. Damoni Erker, MD  OPERATIVE REPORT  DATE OF PROCEDURE:  06/20/2018  PREOPERATIVE DIAGNOSIS:  End-stage degenerative joint disease, left knee.  POSTOPERATIVE DIAGNOSIS:  End-stage degenerative joint disease, left knee.  PROCEDURE:  Left total knee replacement with an Attune system, size 4 tibia, size 4 femur, 10 mm bridging bearing, and a 35 mm all polyethylene patella.  SURGEON:  Dorna Leitz, MD  ASSISTANT:  Gaspar Skeeters PA-C, was present for the entire case and assisted by manipulation of the leg, bone cuts, and closing to minimize OR time.  BRIEF HISTORY:  The patient is a 73 year old female with a long history of significant complaints of left knee pain.  She has been treated conservatively for a prolonged period of time.  She was having night pain and light activity pain.  Because of  failure of all conservative care, she was taken to the operating room for left total knee replacement.  DESCRIPTION OF PROCEDURE:  The patient was taken to the operating room after adequate anesthesia was obtained with a spinal anesthetic.  The patient was placed supine on the operating table.  Left leg was prepped and draped in usual sterile fashion.   Following this, the leg was exsanguinated, blood pressure tourniquet inflated to 300 mmHg.  Following this, a midline incision was made in subcutaneous tissue to the level of the extensor mechanism.  A medial parapatellar arthrotomy was undertaken.   Following this, the medial and lateral meniscus were removed, retropatellar fat pad, synovium and anterior aspect of the femur, and the anterior and posterior cruciates.  Following this, an intramedullary pilot hole was drilled in the femur, and the  4-degree distal valgus cut was made with 9 mm of distal bone resected.  Following this, the attention was turned towards  sizing the femur.  It sizes to a 4.  Anterior and posterior cuts were made, chamfers and box.  Attention was then turned to the  tibia.  It was cut perpendicular to its long axis with a 3-degree posterior slope.  It sized to a 4 and was drilled and keeled.  The 10 mm bridging bearing was put in place, which got her into a nice straight alignment.  Following this, attention was  turned to the patella.  It was cut down and 9.5 mm of bone was resected from the patella and a size 35 paddle was chosen and lugs were drilled for the patella.  Once that was done, the trial patella was put in place.  The knee was put through a range of  motion.  Excellent stability and range of motion were achieved at this point.  At this point, the final components were opened after the trial components were removed, and the knee was copiously and thoroughly irrigated and the knee suctioned dry.  The  final components were cemented into place, size 4 tibia, size 4 femur, a 10 mm bridging bearing trial was placed, and a 35 all poly patella was placed and held with a clamp.  The knee was put through an excellent range of motion and then finally held in  extension.  The cement was allowed to completely harden.  All excess bone cements were removed.  When the cement was completely hardened, the tourniquet was let down, all bleeders controlled with electrocautery, and the final poly size 10 was opened and  placed.  At this point, the knee was irrigated once  more.  The knee was then closed with 1 Vicryl running through the medial parapatellar arthrotomy, and the skin was closed with 0, 2-0 Vicryl and 3-0 Monocryl subcuticular.  Benzoin and Steri-Strips were  applied.  A sterile compressive dressing was applied, and the patient was taken to the recovery room and was noted to be in satisfactory condition.  Estimated blood loss for the procedure was minimal.  LN/NUANCE  D:06/20/2018 T:06/20/2018 JOB:[id number]

## 2018-06-20 NOTE — Progress Notes (Signed)
The on-call ortho MD was paged regarding the pt vomiting x 3, which continued after taking Zofran.

## 2018-06-20 NOTE — Transfer of Care (Signed)
Immediate Anesthesia Transfer of Care Note  Patient: Donna [name]  Procedure(s) Performed: Procedure(s): TOTAL KNEE ARTHROPLASTY (Left)  Patient Location: PACU  Anesthesia Type:Spinal  Level of Consciousness:  sedated, patient cooperative and responds to stimulation  Airway & Oxygen Therapy:Patient Spontanous Breathing and Patient connected to face mask oxgen  Post-op Assessment:  Report given to PACU RN and Post -op Vital signs reviewed and stable  Post vital signs:  Reviewed and stable  Last Vitals:  Vitals:   06/20/18 0939 06/20/18 0940  BP:    Pulse: 87 87  Resp: 20 (!) 22  Temp:    SpO2: 27% 06%    Complications: No apparent anesthesia complications

## 2018-06-20 NOTE — Evaluation (Signed)
Physical Therapy Evaluation Patient Details Name: Donna [name] MRN: [id number] DOB: 05-14-1945 Today's Date: 06/20/2018   History of Present Illness  73 yo female s/p L TKR on 06/20/18. PMH includes HTN, HLD, GERD, UC, R colectomy 2017.  Clinical Impression  Pt presents with L knee pain, decreased L knee ROM, L knee buckling in WB requiring PT knee extension facilitation, and decreased activity tolerance. Pt to benefit from acute PT to address deficits. Pt ambulated short room distance, limited my L knee buckling and L knee pain. Pt educated on ankle pumps (20/hour) to perform this afternoon/evening to increase circulation, to pt's tolerance and limited by pain. PT to progress mobility as tolerated, and will continue to follow acutely.        Follow Up Recommendations Follow surgeon's recommendation for DC plan and follow-up therapies;Supervision for mobility/OOB(HHPT)    Equipment Recommendations  None recommended by PT    Recommendations for Other Services       Precautions / Restrictions Precautions Precautions: Fall Restrictions Weight Bearing Restrictions: No Other Position/Activity Restrictions: WBAT      Mobility  Bed Mobility Overal bed mobility: Needs Assistance Bed Mobility: Supine to Sit     Supine to sit: Min assist;HOB elevated     General bed mobility comments: Min assist for LLE management, lowering once at EOB. Increased time and effort to perform.  Transfers Overall transfer level: Needs assistance Equipment used: Rolling walker (2 wheeled) Transfers: Sit to/from Stand Sit to Stand: From elevated surface;Min guard         General transfer comment: Min guard for safety, verbal cuing for hand placement when rising. Able to shift weight L and R without difficulty.  Ambulation/Gait Ambulation/Gait assistance: Min assist;+2 safety/equipment Gait Distance (Feet): 8 Feet Assistive device: Rolling walker (2 wheeled) Gait Pattern/deviations: Step-to  pattern;Decreased stance time - left;Decreased weight shift to left;Antalgic;Decreased step length - right;Decreased step length - left Gait velocity: decr   General Gait Details: Min assist for L knee extension assist, pt with mild-moderate buckling more pronounced with fatigue. PT discontinued ambulation when pt presented with significant buckling requiring heavy guarding to correct.VC for placement in RW, sequencing.  Stairs            Wheelchair Mobility    Modified Rankin (Stroke Patients Only)       Balance Overall balance assessment: Mild deficits observed, not formally tested                                           Pertinent Vitals/Pain Pain Assessment: 0-10 Pain Score: 2  Pain Location: L knee Pain Descriptors / Indicators: Sore Pain Intervention(s): Limited activity within patient's tolerance;Monitored during session;Premedicated before session;Repositioned;Ice applied    Home Living Family/patient expects to be discharged to:: Private residence Living Arrangements: Alone;Children Available Help at Discharge: Family;Available PRN/intermittently(Pt has 3 sons who will be arranging times to be with pt, to assist as needed) Type of Home: House Home Access: Stairs to enter Entrance Stairs-Rails: Psychiatric nurse of Steps: 7-8 Home Layout: One level Home Equipment: Walker - 2 wheels;Bedside commode;Cane - single point      Prior Function Level of Independence: Independent               Hand Dominance   Dominant Hand: Right    Extremity/Trunk Assessment   Upper Extremity Assessment Upper Extremity Assessment: Overall WFL for tasks assessed  Lower Extremity Assessment Lower Extremity Assessment: Overall WFL for tasks assessed;LLE deficits/detail LLE Deficits / Details: suspected post-surgical weakness; able to perform ankle pumps, quad set, heel slide, SLR without lift assist or quad lag LLE Sensation: WNL     Cervical / Trunk Assessment Cervical / Trunk Assessment: Normal  Communication   Communication: No difficulties  Cognition Arousal/Alertness: Awake/alert Behavior During Therapy: WFL for tasks assessed/performed Overall Cognitive Status: Within Functional Limits for tasks assessed                                        General Comments      Exercises Total Joint Exercises Goniometric ROM: knee aarom ~10-80*, limited by pain   Assessment/Plan    PT Assessment Patient needs continued PT services  PT Problem List Decreased strength;Decreased mobility;Decreased range of motion;Decreased activity tolerance;Decreased balance;Decreased knowledge of use of DME;Pain       PT Treatment Interventions DME instruction;Therapeutic activities;Gait training;Therapeutic exercise;Patient/family education;Balance training;Stair training;Functional mobility training    PT Goals (Current goals can be found in the Care Plan section)  Acute Rehab PT Goals Patient Stated Goal: go home tomorrow PT Goal Formulation: With patient Time For Goal Achievement: 06/27/18 Potential to Achieve Goals: Good    Frequency 7X/week   Barriers to discharge        Co-evaluation               AM-PAC PT "6 Clicks" Mobility  Outcome Measure Help needed turning from your back to your side while in a flat bed without using bedrails?: A Little Help needed moving from lying on your back to sitting on the side of a flat bed without using bedrails?: A Little Help needed moving to and from a bed to a chair (including a wheelchair)?: A Little Help needed standing up from a chair using your arms (e.g., wheelchair or bedside chair)?: A Little Help needed to walk in hospital room?: A Lot Help needed climbing 3-5 steps with a railing? : A Lot 6 Click Score: 16    End of Session Equipment Utilized During Treatment: Gait belt Activity Tolerance: Patient tolerated treatment well Patient left: in  chair;with chair alarm set;with call bell/phone within reach Nurse Communication: Mobility status PT Visit Diagnosis: Other abnormalities of gait and mobility (R26.89);Difficulty in walking, not elsewhere classified (R26.2)    Time: 3559-7416 PT Time Calculation (min) (ACUTE ONLY): 16 min   Charges:   PT Evaluation $PT Eval Low Complexity: 1 Low         Julien Girt, PT Acute Rehabilitation Services Pager [phone]  Office [phone]   Axelle Szwed D Elonda Husky 06/20/2018, 5:23 PM

## 2018-06-21 DIAGNOSIS — Z79899 Other long term (current) drug therapy: Secondary | ICD-10-CM | POA: Diagnosis not present

## 2018-06-21 DIAGNOSIS — K219 Gastro-esophageal reflux disease without esophagitis: Secondary | ICD-10-CM | POA: Diagnosis not present

## 2018-06-21 DIAGNOSIS — I10 Essential (primary) hypertension: Secondary | ICD-10-CM | POA: Diagnosis not present

## 2018-06-21 DIAGNOSIS — Z9049 Acquired absence of other specified parts of digestive tract: Secondary | ICD-10-CM | POA: Diagnosis not present

## 2018-06-21 DIAGNOSIS — Z96652 Presence of left artificial knee joint: Secondary | ICD-10-CM | POA: Diagnosis not present

## 2018-06-21 DIAGNOSIS — M1712 Unilateral primary osteoarthritis, left knee: Secondary | ICD-10-CM | POA: Diagnosis not present

## 2018-06-21 LAB — CBC
HCT: 34.9 % — ABNORMAL LOW (ref 36.0–46.0)
Hemoglobin: 11.2 g/dL — ABNORMAL LOW (ref 12.0–15.0)
MCH: 30.9 pg (ref 26.0–34.0)
MCHC: 32.1 g/dL (ref 30.0–36.0)
MCV: 96.4 fL (ref 80.0–100.0)
Platelets: 183 10*3/uL (ref 150–400)
RBC: 3.62 MIL/uL — ABNORMAL LOW (ref 3.87–5.11)
RDW: 14.4 % (ref 11.5–15.5)
WBC: 7.6 10*3/uL (ref 4.0–10.5)
nRBC: 0 % (ref 0.0–0.2)

## 2018-06-21 MED ORDER — OXYCODONE HCL 5 MG PO TABS: 5.0000 mg | ORAL_TABLET | ORAL | 0 refills | Status: DC | PRN

## 2018-06-21 MED ORDER — CELECOXIB 200 MG PO CAPS: 200.0000 mg | ORAL_CAPSULE | Freq: Two times a day (BID) | ORAL | 0 refills | Status: DC

## 2018-06-21 MED ORDER — METHOCARBAMOL 500 MG PO TABS: 500.0000 mg | ORAL_TABLET | Freq: Four times a day (QID) | ORAL | 0 refills | Status: DC | PRN

## 2018-06-21 MED ORDER — ASPIRIN 325 MG PO TBEC: 325.0000 mg | DELAYED_RELEASE_TABLET | Freq: Two times a day (BID) | ORAL | 0 refills | Status: DC

## 2018-06-21 NOTE — Progress Notes (Signed)
   Home health agencies that serve [id number].        Home Health Agencies Search Results  Results List Table  Home Health Agency Information Quality of Patient Care Rating Patient Survey Summary Rating  [facility] [phone] 4 out of 5 stars 4 out of 5 stars  [facility] [phone] 4  out of 5 stars 3 out of 5 stars  [facility] [phone] 4 out of 5 stars 4 out of 5 stars  [facility] [phone] 4 out of 5 stars 4 out of 5 stars  [facility] [phone] 3  out of 5 stars 4 out of 5 stars  [facility] [phone] 3 out of 5 stars 4 out of 5 stars  [facility] [phone] 4 out of 5 stars Not Available12  [facility] [phone] 3  out of 5 stars 3 out of 5 stars  [facility] [phone] 3  out of 5 stars 4 out of 5 stars  [facility] [phone] 3  out of 5 stars 3 out of 5 stars  [facility] [phone] 4  out of 5 stars 3 out of 5 stars   Home Health Footnotes  Footnote number Footnote as displayed on Home Health Compare  1 This agency provides services under a federal waiver program to non-traditional, chronic long term population.  2 This agency provides services to a special needs population.  3 Not Available.  4 The number of patient episodes for this measure is too small to report.  5 This measure currently does not have data or provider has been certified/recertified for less than 6 months.  6 The national average for this measure is not provided because of state-to-state differences in data collection.  7 Medicare is not displaying rates for this measure for any home health agency, because of an issue with the data.  8 There were problems with the data and they are being corrected.  9 Zero, or very few, patients met the survey's rules for inclusion. The scores shown, if any, reflect a  very small number of surveys and may not accurately tell how an agency is doing.  10 Survey results are based on less than 12 months of data.  11 Fewer than 70 patients completed the survey. Use the scores shown, if any, with caution as the number of surveys may be too low to accurately tell how an agency is doing.  12 No survey results are available for this period.  13 Data suppressed by CMS for one or more quarters.    

## 2018-06-21 NOTE — Progress Notes (Signed)
   06/21/18 1200  PT Visit Information  Last PT Received On 06/21/18--pt progressing toward PT goals; continues to require cues for safety--recommend assist/close supervision at home for safety. Reviewed HEP, pt reported decr pain after exercises. Reviewed LLE positioning and  Knee precautions   Assistance Needed +1  History of Present Illness 73 yo female s/p L TKR on 06/20/18. PMH includes HTN, HLD, GERD, UC, R colectomy 2017.  Subjective Data  Patient Stated Goal go home tomorrow  Precautions  Precautions Fall;Knee  Restrictions  Other Position/Activity Restrictions WBAT  Pain Assessment  Pain Assessment 0-10  Pain Score 4  Pain Location L knee  Pain Descriptors / Indicators Sore  Pain Intervention(s) Limited activity within patient's tolerance;Monitored during session;Repositioned;Premedicated before session;Ice applied  Cognition  Arousal/Alertness Awake/alert  Behavior During Therapy WFL for tasks assessed/performed  Overall Cognitive Status Within Functional Limits for tasks assessed  Bed Mobility  Overal bed mobility Needs Assistance  Bed Mobility Sit to Supine  Sit to supine Min guard  General bed mobility comments for safety  Transfers  Overall transfer level Needs assistance  Equipment used Rolling walker (2 wheeled)  Transfers Sit to/from Stand  Sit to Stand Min guard  General transfer comment cues for hand placement, overall safety  Ambulation/Gait  Ambulation/Gait assistance Min guard;Supervision  Gait Distance (Feet) 5 Feet  Assistive device Rolling walker (2 wheeled)  Gait Pattern/deviations Step-to pattern;Decreased weight shift to left  General Gait Details cues for sequence, RW position, and safety  Total Joint Exercises  Goniometric ROM grossly 5* to 60* AAROM left  knee flexion  Ankle Circles/Pumps AROM;Both;10 reps  Quad Sets AROM;Both;10 reps  [place] [employer];Right;10 reps  Heel Slides AAROM;Right;10 reps  Hip ABduction/ADduction  AROM;Right;10 reps;AAROM  Straight Leg Raises AAROM;Right;10 reps  PT - End of Session  Equipment Utilized During Treatment Gait belt  Activity Tolerance Patient tolerated treatment well  Patient left in bed;with call bell/phone within reach;with bed alarm set  Nurse Communication Mobility status   PT - Assessment/Plan  PT Plan Current plan remains appropriate  PT Visit Diagnosis Other abnormalities of gait and mobility (R26.89);Difficulty in walking, not elsewhere classified (R26.2)  PT Frequency (ACUTE ONLY) 7X/week  Follow Up Recommendations Follow surgeon's recommendation for DC plan and follow-up therapies;Supervision for mobility/OOB  PT equipment None recommended by PT  AM-PAC PT "6 Clicks" Mobility Outcome Measure (Version 2)  Help needed turning from your back to your side while in a flat bed without using bedrails? 3  Help needed moving from lying on your back to sitting on the side of a flat bed without using bedrails? 3  Help needed moving to and from a bed to a chair (including a wheelchair)? 3  Help needed standing up from a chair using your arms (e.g., wheelchair or bedside chair)? 3  Help needed to walk in hospital room? 3  Help needed climbing 3-5 steps with a railing?  3  6 Click Score 18  Consider Recommendation of Discharge To: Home with [facility]  PT Goal Progression  Progress towards PT goals Progressing toward goals  Acute Rehab PT Goals  PT Goal Formulation With patient  Time For Goal Achievement 06/27/18  Potential to Achieve Goals Good  PT Time Calculation  PT Start Time (ACUTE ONLY) 1225  PT Stop Time (ACUTE ONLY) 1242  PT Time Calculation (min) (ACUTE ONLY) 17 min  PT General Charges  $$ ACUTE PT VISIT 1 Visit  PT Treatments  $Therapeutic Exercise 8-22 mins

## 2018-06-21 NOTE — Plan of Care (Signed)
  Problem: Education: Goal: Knowledge of the prescribed therapeutic regimen will improve Outcome: Progressing   Problem: Activity: Goal: Ability to avoid complications of mobility impairment will improve Outcome: Progressing   Problem: Pain Management: Goal: Pain level will decrease with appropriate interventions Outcome: Progressing   Problem: Education: Goal: Knowledge of General Education information will improve Description: Including pain rating scale, medication(s)/side effects and non-pharmacologic comfort measures Outcome: Progressing   Problem: Health Behavior/Discharge Planning: Goal: Ability to manage health-related needs will improve Outcome: Progressing

## 2018-06-21 NOTE — TOC Initial Note (Signed)
Transition of Care [facility]) - Initial/Assessment Note    Patient Details  Name: Donna [name] MRN: [id number] Date of Birth: Apr 09, 1945  Transition of Care [facility]) CM/SW Contact:    Joaquin Courts, RN Phone Number: 06/21/2018, 10:13 AM  Clinical Narrative:         CM spoke with patient at bedside. [facility], patient reports she has a rolling walker and 3-in-1 at home.           Expected Discharge Plan: [place] Barriers to Discharge: No Barriers Identified   Patient Goals and CMS Choice Patient states their goals for this hospitalization and ongoing recovery are:: to go home CMS Medicare.gov Compare Post Acute Care list provided to:: Patient Choice offered to / list presented to : Patient  Expected Discharge Plan and Services Expected Discharge Plan: [place]   Discharge Planning Services: CM Consult Post Acute Care Choice: [place] arrangements for the past 2 months: Single Family Home Expected Discharge Date: 06/21/18               DME Arranged: N/A DME Agency: NA       HH Arranged: PT HH Agency: [facility] Date [place]: 06/21/18 Time [place]: 1013 Representative spoke with at [place]: cassie  Prior Living Arrangements/Services Living arrangements for the past 2 months: [place] with:: Self, Adult Children Patient language and need for interpreter reviewed:: Yes Do you feel safe going back to the place where you live?: Yes      Need for Family Participation in Patient Care: Yes (Comment) Care giver support system in place?: Yes (comment)   Criminal Activity/Legal Involvement Pertinent to Current Situation/Hospitalization: No - Comment as needed  Activities of Daily Living Home Assistive Devices/Equipment: Eyeglasses ADL Screening (condition at time of admission) Patient's cognitive ability adequate to safely complete  daily activities?: Yes Is the patient deaf or have difficulty hearing?: No Does the patient have difficulty seeing, even when wearing glasses/contacts?: No Does the patient have difficulty concentrating, remembering, or making decisions?: No Patient able to express need for assistance with ADLs?: Yes Does the patient have difficulty dressing or bathing?: No Independently performs ADLs?: Yes (appropriate for developmental age) Does the patient have difficulty walking or climbing stairs?: No Weakness of Legs: None Weakness of Arms/Hands: None  Permission Sought/Granted                  Emotional Assessment Appearance:: Appears stated age Attitude/Demeanor/Rapport: Engaged Affect (typically observed): Accepting Orientation: : Oriented to Self, Oriented to Place, Oriented to  Time, Oriented to Situation   Psych Involvement: No (comment)  Admission diagnosis:  LEFT KNEE OSTEOARTHRITIS Patient Active Problem List   Diagnosis Date Noted  . Primary osteoarthritis of left knee 06/20/2018  . Essential hypertension 01/21/2015  . GERD (gastroesophageal reflux disease) 01/21/2015  . Degeneration of lumbar or lumbosacral intervertebral disc 01/21/2015  . Hyperlipidemia 01/21/2015  . [name] ([place]) 01/13/2015  . Bilateral carpal tunnel syndrome 01/05/2015   PCP:  Deland Pretty, MD Pharmacy:   [place], [place] - [address] [place] [address] Phone: [phone] Fax: [phone]     Social Determinants of Health (SDOH) Interventions    Readmission Risk Interventions No flowsheet data found.

## 2018-06-21 NOTE — Progress Notes (Signed)
Physical Therapy Treatment Patient Details Name: Donna [name] MRN: [id number] DOB: 26-Jan-1945 Today's Date: 06/21/2018    History of Present Illness 73 yo female s/p L TKR on 06/20/18. PMH includes HTN, HLD, GERD, UC, R colectomy 2017.    PT Comments    Pt progressing well; will see for second session for review HEP  Follow Up Recommendations  Follow surgeon's recommendation for DC plan and follow-up therapies;Supervision for mobility/OOB     Equipment Recommendations  None recommended by PT    Recommendations for Other Services       Precautions / Restrictions Precautions Precautions: Fall;Knee Restrictions Weight Bearing Restrictions: No Other Position/Activity Restrictions: WBAT    Mobility  Bed Mobility               General bed mobility comments: in recliner  Transfers Overall transfer level: Needs assistance Equipment used: Rolling walker (2 wheeled) Transfers: Sit to/from Stand Sit to Stand: Min guard         General transfer comment: cues for hand placement, overall safety  Ambulation/Gait Ambulation/Gait assistance: Min guard;Supervision Gait Distance (Feet): 100 Feet(25' more) Assistive device: Rolling walker (2 wheeled) Gait Pattern/deviations: Step-to pattern;Decreased weight shift to left     General Gait Details: cues for sequence, RW position, step length   Stairs Stairs: Yes Stairs assistance: Min assist Stair Management: Step to pattern;Backwards;With walker Number of Stairs: 3 General stair comments: cues for sequence, safety, technique. reviewed handout after practice   Wheelchair Mobility    Modified Rankin (Stroke Patients Only)       Balance                                            Cognition Arousal/Alertness: Awake/alert Behavior During Therapy: WFL for tasks assessed/performed Overall Cognitive Status: Within Functional Limits for tasks assessed                                         Exercises      General Comments        Pertinent Vitals/Pain Pain Assessment: 0-10 Pain Score: 3  Pain Location: L knee Pain Descriptors / Indicators: Sore Pain Intervention(s): Limited activity within patient's tolerance;Monitored during session;Ice applied    Home Living                      Prior Function            PT Goals (current goals can now be found in the care plan section) Acute Rehab PT Goals Patient Stated Goal: go home tomorrow PT Goal Formulation: With patient Time For Goal Achievement: 06/27/18 Potential to Achieve Goals: Good Progress towards PT goals: Progressing toward goals    Frequency    7X/week      PT Plan Current plan remains appropriate    Co-evaluation              AM-PAC PT "6 Clicks" Mobility   Outcome Measure  Help needed turning from your back to your side while in a flat bed without using bedrails?: A Little Help needed moving from lying on your back to sitting on the side of a flat bed without using bedrails?: A Little Help needed moving to and from a bed to a chair (including a wheelchair)?:  A Little Help needed standing up from a chair using your arms (e.g., wheelchair or bedside chair)?: A Little Help needed to walk in hospital room?: A Little Help needed climbing 3-5 steps with a railing? : A Little 6 Click Score: 18    End of Session Equipment Utilized During Treatment: Gait belt Activity Tolerance: Patient tolerated treatment well Patient left: in chair;with chair alarm set;with call bell/phone within reach Nurse Communication: Mobility status PT Visit Diagnosis: Other abnormalities of gait and mobility (R26.89);Difficulty in walking, not elsewhere classified (R26.2)     Time: 8337-4451 PT Time Calculation (min) (ACUTE ONLY): 17 min  Charges:  $Gait Training: 8-22 mins                     Kenyon Ana, PT  Pager: [phone] Acute Rehab Dept [facility]):  [phone]   06/21/2018    [facility] 06/21/2018, 10:39 AM

## 2018-06-21 NOTE — Progress Notes (Signed)
DC instructions provided and explained in detail. Patient verbalizes understanding. Escorted to lobby in wheelchair to be discharged home with SON

## 2018-06-21 NOTE — Progress Notes (Signed)
    Patient doing well PO day 1 S/P L knee TKR by Dr Berenice Primas, pt has been up and walking and progressing well, tolerating PO and NL B/B function, denies N/V/D. Pending PT. Eager to progress home   BP 122/66 (BP Location: Right Arm)   Pulse 71   Temp 98.6 F (37 C) (Oral)   Resp 18   Ht 5' 3"  (1.6 m)   Wt 85.9 kg   SpO2 97%   BMI 33.53 kg/m    CBC Latest Ref Rng & Units 06/21/2018 06/17/2018 07/27/2016  WBC 4.0 - 10.5 K/uL 7.6 6.0 5.4  Hemoglobin 12.0 - 15.0 g/dL 11.2(L) 12.3 11.9(L)  Hematocrit 36.0 - 46.0 % 34.9(L) 38.5 34.9(L)  Platelets 150 - 400 K/uL 183 213 211    CMP Latest Ref Rng & Units 06/17/2018 07/27/2016 01/20/2015  Glucose 70 - 99 mg/dL 107(H) 121(H) 134(H)  BUN 8 - 23 mg/dL 15 17 <5(L)  Creatinine 0.44 - 1.00 mg/dL 0.93 1.17(H) 0.74  Sodium 135 - 145 mmol/L 143 139 141  Potassium 3.5 - 5.1 mmol/L 3.8 3.3(L) 3.0(L)  Chloride 98 - 111 mmol/L 103 106 101  CO2 22 - 32 mmol/L 27 23 31   Calcium 8.9 - 10.3 mg/dL 9.7 9.5 9.2  Total Protein 6.5 - 8.1 g/dL 6.8 6.9 6.3(L)  Total Bilirubin 0.3 - 1.2 mg/dL 0.7 0.9 0.8  Alkaline Phos 38 - 126 U/L 70 69 108  AST 15 - 41 U/L 27 20 36  ALT 0 - 44 U/L 20 16 37    Dressing in place, CDI, distal compartments soft and 2+ DPP + BIL, pt sitting up comfortably at edge of bed NVI  POD #1 s/p L knee TKR per Dr Berenice Primas team doing well   - up with PT/OT, encourage ambulation - Percocet for pain, Valium for muscle spasms, sent to pharm elec. - d/c home today post PT with f/u in 2 weeks - ASA 325 BID X 4 weeks

## 2018-06-23 ENCOUNTER — Encounter (HOSPITAL_COMMUNITY): Payer: Self-pay | Admitting: Orthopedic Surgery

## 2018-06-23 DIAGNOSIS — M150 Primary generalized (osteo)arthritis: Secondary | ICD-10-CM | POA: Diagnosis not present

## 2018-06-23 DIAGNOSIS — Z471 Aftercare following joint replacement surgery: Secondary | ICD-10-CM | POA: Diagnosis not present

## 2018-06-23 DIAGNOSIS — Z96652 Presence of left artificial knee joint: Secondary | ICD-10-CM | POA: Diagnosis not present

## 2018-06-23 DIAGNOSIS — R2689 Other abnormalities of gait and mobility: Secondary | ICD-10-CM | POA: Diagnosis not present

## 2018-06-23 DIAGNOSIS — I10 Essential (primary) hypertension: Secondary | ICD-10-CM | POA: Diagnosis not present

## 2018-06-23 DIAGNOSIS — M62562 Muscle wasting and atrophy, not elsewhere classified, left lower leg: Secondary | ICD-10-CM | POA: Diagnosis not present

## 2018-06-23 MED FILL — REPATHA SURECLICK 140 MG/ML: 140 | 30 days supply | Qty: 2 | Fill #3

## 2018-06-25 DIAGNOSIS — Z96652 Presence of left artificial knee joint: Secondary | ICD-10-CM | POA: Diagnosis not present

## 2018-06-25 DIAGNOSIS — R2689 Other abnormalities of gait and mobility: Secondary | ICD-10-CM | POA: Diagnosis not present

## 2018-06-25 DIAGNOSIS — M150 Primary generalized (osteo)arthritis: Secondary | ICD-10-CM | POA: Diagnosis not present

## 2018-06-25 DIAGNOSIS — I10 Essential (primary) hypertension: Secondary | ICD-10-CM | POA: Diagnosis not present

## 2018-06-25 DIAGNOSIS — Z471 Aftercare following joint replacement surgery: Secondary | ICD-10-CM | POA: Diagnosis not present

## 2018-06-25 DIAGNOSIS — M62562 Muscle wasting and atrophy, not elsewhere classified, left lower leg: Secondary | ICD-10-CM | POA: Diagnosis not present

## 2018-06-26 DIAGNOSIS — M62562 Muscle wasting and atrophy, not elsewhere classified, left lower leg: Secondary | ICD-10-CM | POA: Diagnosis not present

## 2018-06-26 DIAGNOSIS — Z471 Aftercare following joint replacement surgery: Secondary | ICD-10-CM | POA: Diagnosis not present

## 2018-06-26 DIAGNOSIS — R2689 Other abnormalities of gait and mobility: Secondary | ICD-10-CM | POA: Diagnosis not present

## 2018-06-26 DIAGNOSIS — M150 Primary generalized (osteo)arthritis: Secondary | ICD-10-CM | POA: Diagnosis not present

## 2018-06-26 DIAGNOSIS — Z96652 Presence of left artificial knee joint: Secondary | ICD-10-CM | POA: Diagnosis not present

## 2018-06-26 DIAGNOSIS — I10 Essential (primary) hypertension: Secondary | ICD-10-CM | POA: Diagnosis not present

## 2018-06-30 DIAGNOSIS — Z471 Aftercare following joint replacement surgery: Secondary | ICD-10-CM | POA: Diagnosis not present

## 2018-06-30 DIAGNOSIS — M150 Primary generalized (osteo)arthritis: Secondary | ICD-10-CM | POA: Diagnosis not present

## 2018-06-30 DIAGNOSIS — M1712 Unilateral primary osteoarthritis, left knee: Secondary | ICD-10-CM | POA: Diagnosis not present

## 2018-06-30 DIAGNOSIS — M62562 Muscle wasting and atrophy, not elsewhere classified, left lower leg: Secondary | ICD-10-CM | POA: Diagnosis not present

## 2018-06-30 DIAGNOSIS — I10 Essential (primary) hypertension: Secondary | ICD-10-CM | POA: Diagnosis not present

## 2018-06-30 DIAGNOSIS — R2689 Other abnormalities of gait and mobility: Secondary | ICD-10-CM | POA: Diagnosis not present

## 2018-06-30 DIAGNOSIS — Z96652 Presence of left artificial knee joint: Secondary | ICD-10-CM | POA: Diagnosis not present

## 2018-06-30 DIAGNOSIS — Z9889 Other specified postprocedural states: Secondary | ICD-10-CM | POA: Diagnosis not present

## 2018-07-03 DIAGNOSIS — I10 Essential (primary) hypertension: Secondary | ICD-10-CM | POA: Diagnosis not present

## 2018-07-03 DIAGNOSIS — Z96652 Presence of left artificial knee joint: Secondary | ICD-10-CM | POA: Diagnosis not present

## 2018-07-03 DIAGNOSIS — M62562 Muscle wasting and atrophy, not elsewhere classified, left lower leg: Secondary | ICD-10-CM | POA: Diagnosis not present

## 2018-07-03 DIAGNOSIS — R2689 Other abnormalities of gait and mobility: Secondary | ICD-10-CM | POA: Diagnosis not present

## 2018-07-03 DIAGNOSIS — Z471 Aftercare following joint replacement surgery: Secondary | ICD-10-CM | POA: Diagnosis not present

## 2018-07-03 DIAGNOSIS — M150 Primary generalized (osteo)arthritis: Secondary | ICD-10-CM | POA: Diagnosis not present

## 2018-07-08 DIAGNOSIS — M150 Primary generalized (osteo)arthritis: Secondary | ICD-10-CM | POA: Diagnosis not present

## 2018-07-08 DIAGNOSIS — Z471 Aftercare following joint replacement surgery: Secondary | ICD-10-CM | POA: Diagnosis not present

## 2018-07-08 DIAGNOSIS — M62562 Muscle wasting and atrophy, not elsewhere classified, left lower leg: Secondary | ICD-10-CM | POA: Diagnosis not present

## 2018-07-08 DIAGNOSIS — I10 Essential (primary) hypertension: Secondary | ICD-10-CM | POA: Diagnosis not present

## 2018-07-08 DIAGNOSIS — Z96652 Presence of left artificial knee joint: Secondary | ICD-10-CM | POA: Diagnosis not present

## 2018-07-08 DIAGNOSIS — R2689 Other abnormalities of gait and mobility: Secondary | ICD-10-CM | POA: Diagnosis not present

## 2018-07-11 DIAGNOSIS — R2689 Other abnormalities of gait and mobility: Secondary | ICD-10-CM | POA: Diagnosis not present

## 2018-07-11 DIAGNOSIS — Z471 Aftercare following joint replacement surgery: Secondary | ICD-10-CM | POA: Diagnosis not present

## 2018-07-11 DIAGNOSIS — M150 Primary generalized (osteo)arthritis: Secondary | ICD-10-CM | POA: Diagnosis not present

## 2018-07-11 DIAGNOSIS — I10 Essential (primary) hypertension: Secondary | ICD-10-CM | POA: Diagnosis not present

## 2018-07-11 DIAGNOSIS — Z96652 Presence of left artificial knee joint: Secondary | ICD-10-CM | POA: Diagnosis not present

## 2018-07-11 DIAGNOSIS — M62562 Muscle wasting and atrophy, not elsewhere classified, left lower leg: Secondary | ICD-10-CM | POA: Diagnosis not present

## 2018-07-17 DIAGNOSIS — M1712 Unilateral primary osteoarthritis, left knee: Secondary | ICD-10-CM | POA: Diagnosis not present

## 2018-07-17 MED FILL — REPATHA SURECLICK 140 MG/ML: 140 | 30 days supply | Qty: 2 | Fill #0

## 2018-07-23 DIAGNOSIS — M25662 Stiffness of left knee, not elsewhere classified: Secondary | ICD-10-CM | POA: Diagnosis not present

## 2018-07-23 DIAGNOSIS — M6281 Muscle weakness (generalized): Secondary | ICD-10-CM | POA: Diagnosis not present

## 2018-07-23 DIAGNOSIS — Z96652 Presence of left artificial knee joint: Secondary | ICD-10-CM | POA: Diagnosis not present

## 2018-07-28 DIAGNOSIS — M25662 Stiffness of left knee, not elsewhere classified: Secondary | ICD-10-CM | POA: Diagnosis not present

## 2018-07-28 DIAGNOSIS — M6281 Muscle weakness (generalized): Secondary | ICD-10-CM | POA: Diagnosis not present

## 2018-07-28 DIAGNOSIS — Z96652 Presence of left artificial knee joint: Secondary | ICD-10-CM | POA: Diagnosis not present

## 2018-07-30 DIAGNOSIS — M25662 Stiffness of left knee, not elsewhere classified: Secondary | ICD-10-CM | POA: Diagnosis not present

## 2018-07-30 DIAGNOSIS — M6281 Muscle weakness (generalized): Secondary | ICD-10-CM | POA: Diagnosis not present

## 2018-07-30 DIAGNOSIS — Z96652 Presence of left artificial knee joint: Secondary | ICD-10-CM | POA: Diagnosis not present

## 2018-07-31 DIAGNOSIS — Z471 Aftercare following joint replacement surgery: Secondary | ICD-10-CM | POA: Diagnosis not present

## 2018-08-04 DIAGNOSIS — M25662 Stiffness of left knee, not elsewhere classified: Secondary | ICD-10-CM | POA: Diagnosis not present

## 2018-08-04 DIAGNOSIS — Z96652 Presence of left artificial knee joint: Secondary | ICD-10-CM | POA: Diagnosis not present

## 2018-08-04 DIAGNOSIS — M6281 Muscle weakness (generalized): Secondary | ICD-10-CM | POA: Diagnosis not present

## 2018-08-05 DIAGNOSIS — M25662 Stiffness of left knee, not elsewhere classified: Secondary | ICD-10-CM | POA: Diagnosis not present

## 2018-08-05 DIAGNOSIS — M6281 Muscle weakness (generalized): Secondary | ICD-10-CM | POA: Diagnosis not present

## 2018-08-05 DIAGNOSIS — Z96652 Presence of left artificial knee joint: Secondary | ICD-10-CM | POA: Diagnosis not present

## 2018-08-07 DIAGNOSIS — K5100 Ulcerative (chronic) pancolitis without complications: Secondary | ICD-10-CM | POA: Diagnosis not present

## 2018-08-07 DIAGNOSIS — R1310 Dysphagia, unspecified: Secondary | ICD-10-CM | POA: Diagnosis not present

## 2018-08-11 DIAGNOSIS — Z96652 Presence of left artificial knee joint: Secondary | ICD-10-CM | POA: Diagnosis not present

## 2018-08-11 DIAGNOSIS — M6281 Muscle weakness (generalized): Secondary | ICD-10-CM | POA: Diagnosis not present

## 2018-08-11 DIAGNOSIS — M25662 Stiffness of left knee, not elsewhere classified: Secondary | ICD-10-CM | POA: Diagnosis not present

## 2018-08-13 DIAGNOSIS — R7309 Other abnormal glucose: Secondary | ICD-10-CM | POA: Diagnosis not present

## 2018-08-13 DIAGNOSIS — I10 Essential (primary) hypertension: Secondary | ICD-10-CM | POA: Diagnosis not present

## 2018-08-13 DIAGNOSIS — E7800 Pure hypercholesterolemia, unspecified: Secondary | ICD-10-CM | POA: Diagnosis not present

## 2018-08-13 DIAGNOSIS — N183 Chronic kidney disease, stage 3 (moderate): Secondary | ICD-10-CM | POA: Diagnosis not present

## 2018-08-13 MED FILL — REPATHA SURECLICK 140 MG/ML: 140 | 30 days supply | Qty: 2 | Fill #1

## 2018-08-14 DIAGNOSIS — M25662 Stiffness of left knee, not elsewhere classified: Secondary | ICD-10-CM | POA: Diagnosis not present

## 2018-08-14 DIAGNOSIS — M6281 Muscle weakness (generalized): Secondary | ICD-10-CM | POA: Diagnosis not present

## 2018-08-14 DIAGNOSIS — Z96652 Presence of left artificial knee joint: Secondary | ICD-10-CM | POA: Diagnosis not present

## 2018-08-18 DIAGNOSIS — M6281 Muscle weakness (generalized): Secondary | ICD-10-CM | POA: Diagnosis not present

## 2018-08-18 DIAGNOSIS — M25662 Stiffness of left knee, not elsewhere classified: Secondary | ICD-10-CM | POA: Diagnosis not present

## 2018-08-18 DIAGNOSIS — Z96652 Presence of left artificial knee joint: Secondary | ICD-10-CM | POA: Diagnosis not present

## 2018-08-21 DIAGNOSIS — H40053 Ocular hypertension, bilateral: Secondary | ICD-10-CM | POA: Diagnosis not present

## 2018-08-22 DIAGNOSIS — M25662 Stiffness of left knee, not elsewhere classified: Secondary | ICD-10-CM | POA: Diagnosis not present

## 2018-08-22 DIAGNOSIS — M6281 Muscle weakness (generalized): Secondary | ICD-10-CM | POA: Diagnosis not present

## 2018-08-22 DIAGNOSIS — Z96652 Presence of left artificial knee joint: Secondary | ICD-10-CM | POA: Diagnosis not present

## 2018-08-28 DIAGNOSIS — Z9889 Other specified postprocedural states: Secondary | ICD-10-CM | POA: Diagnosis not present

## 2018-09-02 DIAGNOSIS — M6281 Muscle weakness (generalized): Secondary | ICD-10-CM | POA: Diagnosis not present

## 2018-09-02 DIAGNOSIS — M25662 Stiffness of left knee, not elsewhere classified: Secondary | ICD-10-CM | POA: Diagnosis not present

## 2018-09-02 DIAGNOSIS — Z96652 Presence of left artificial knee joint: Secondary | ICD-10-CM | POA: Diagnosis not present

## 2018-09-03 DIAGNOSIS — M25662 Stiffness of left knee, not elsewhere classified: Secondary | ICD-10-CM | POA: Diagnosis not present

## 2018-09-03 DIAGNOSIS — M6281 Muscle weakness (generalized): Secondary | ICD-10-CM | POA: Diagnosis not present

## 2018-09-10 DIAGNOSIS — M6281 Muscle weakness (generalized): Secondary | ICD-10-CM | POA: Diagnosis not present

## 2018-09-10 DIAGNOSIS — M25662 Stiffness of left knee, not elsewhere classified: Secondary | ICD-10-CM | POA: Diagnosis not present

## 2018-09-10 DIAGNOSIS — Z96652 Presence of left artificial knee joint: Secondary | ICD-10-CM | POA: Diagnosis not present

## 2018-09-11 MED FILL — REPATHA SURECLICK 140 MG/ML: 140 | 30 days supply | Qty: 2 | Fill #2

## 2018-09-15 DIAGNOSIS — M25662 Stiffness of left knee, not elsewhere classified: Secondary | ICD-10-CM | POA: Diagnosis not present

## 2018-09-15 DIAGNOSIS — Z471 Aftercare following joint replacement surgery: Secondary | ICD-10-CM | POA: Diagnosis not present

## 2018-09-15 DIAGNOSIS — M6281 Muscle weakness (generalized): Secondary | ICD-10-CM | POA: Diagnosis not present

## 2018-09-15 DIAGNOSIS — Z96652 Presence of left artificial knee joint: Secondary | ICD-10-CM | POA: Diagnosis not present

## 2018-09-23 DIAGNOSIS — M25662 Stiffness of left knee, not elsewhere classified: Secondary | ICD-10-CM | POA: Diagnosis not present

## 2018-09-23 DIAGNOSIS — Z96652 Presence of left artificial knee joint: Secondary | ICD-10-CM | POA: Diagnosis not present

## 2018-09-23 DIAGNOSIS — M6281 Muscle weakness (generalized): Secondary | ICD-10-CM | POA: Diagnosis not present

## 2018-09-25 DIAGNOSIS — M6281 Muscle weakness (generalized): Secondary | ICD-10-CM | POA: Diagnosis not present

## 2018-09-25 DIAGNOSIS — Z96652 Presence of left artificial knee joint: Secondary | ICD-10-CM | POA: Diagnosis not present

## 2018-09-25 DIAGNOSIS — M25662 Stiffness of left knee, not elsewhere classified: Secondary | ICD-10-CM | POA: Diagnosis not present

## 2018-09-26 DIAGNOSIS — Z23 Encounter for immunization: Secondary | ICD-10-CM | POA: Diagnosis not present

## 2018-09-29 DIAGNOSIS — Z96652 Presence of left artificial knee joint: Secondary | ICD-10-CM | POA: Diagnosis not present

## 2018-09-29 DIAGNOSIS — M25662 Stiffness of left knee, not elsewhere classified: Secondary | ICD-10-CM | POA: Diagnosis not present

## 2018-09-29 DIAGNOSIS — M6281 Muscle weakness (generalized): Secondary | ICD-10-CM | POA: Diagnosis not present

## 2018-10-01 DIAGNOSIS — Z96652 Presence of left artificial knee joint: Secondary | ICD-10-CM | POA: Diagnosis not present

## 2018-10-01 DIAGNOSIS — M6281 Muscle weakness (generalized): Secondary | ICD-10-CM | POA: Diagnosis not present

## 2018-10-01 DIAGNOSIS — M25662 Stiffness of left knee, not elsewhere classified: Secondary | ICD-10-CM | POA: Diagnosis not present

## 2018-10-27 DIAGNOSIS — M25562 Pain in left knee: Secondary | ICD-10-CM | POA: Diagnosis not present

## 2018-10-27 DIAGNOSIS — M67911 Unspecified disorder of synovium and tendon, right shoulder: Secondary | ICD-10-CM | POA: Diagnosis not present

## 2018-10-27 DIAGNOSIS — M67912 Unspecified disorder of synovium and tendon, left shoulder: Secondary | ICD-10-CM | POA: Diagnosis not present

## 2018-10-27 DIAGNOSIS — M25561 Pain in right knee: Secondary | ICD-10-CM | POA: Diagnosis not present

## 2018-11-03 MED FILL — REPATHA SURECLICK 140 MG/ML: 140 | 30 days supply | Qty: 2 | Fill #1

## 2018-11-20 DIAGNOSIS — I10 Essential (primary) hypertension: Secondary | ICD-10-CM | POA: Diagnosis not present

## 2018-11-20 DIAGNOSIS — E7800 Pure hypercholesterolemia, unspecified: Secondary | ICD-10-CM | POA: Diagnosis not present

## 2018-11-25 DIAGNOSIS — E7800 Pure hypercholesterolemia, unspecified: Secondary | ICD-10-CM | POA: Diagnosis not present

## 2018-11-25 DIAGNOSIS — Z789 Other specified health status: Secondary | ICD-10-CM | POA: Diagnosis not present

## 2018-11-25 DIAGNOSIS — R7309 Other abnormal glucose: Secondary | ICD-10-CM | POA: Diagnosis not present

## 2018-11-25 DIAGNOSIS — Z791 Long term (current) use of non-steroidal anti-inflammatories (NSAID): Secondary | ICD-10-CM | POA: Diagnosis not present

## 2018-11-25 DIAGNOSIS — K219 Gastro-esophageal reflux disease without esophagitis: Secondary | ICD-10-CM | POA: Diagnosis not present

## 2018-11-25 DIAGNOSIS — D72819 Decreased white blood cell count, unspecified: Secondary | ICD-10-CM | POA: Diagnosis not present

## 2018-11-25 DIAGNOSIS — I10 Essential (primary) hypertension: Secondary | ICD-10-CM | POA: Diagnosis not present

## 2018-11-25 DIAGNOSIS — Z0000 Encounter for general adult medical examination without abnormal findings: Secondary | ICD-10-CM | POA: Diagnosis not present

## 2018-11-25 DIAGNOSIS — N183 Chronic kidney disease, stage 3 unspecified: Secondary | ICD-10-CM | POA: Diagnosis not present

## 2018-12-04 MED FILL — REPATHA SURECLICK 140 MG/ML: 140 | 30 days supply | Qty: 2 | Fill #2

## 2018-12-08 DIAGNOSIS — M25562 Pain in left knee: Secondary | ICD-10-CM | POA: Diagnosis not present

## 2018-12-08 DIAGNOSIS — S46012A Strain of muscle(s) and tendon(s) of the rotator cuff of left shoulder, initial encounter: Secondary | ICD-10-CM | POA: Diagnosis not present

## 2018-12-08 DIAGNOSIS — S46011A Strain of muscle(s) and tendon(s) of the rotator cuff of right shoulder, initial encounter: Secondary | ICD-10-CM | POA: Diagnosis not present

## 2018-12-17 DIAGNOSIS — Z1159 Encounter for screening for other viral diseases: Secondary | ICD-10-CM | POA: Diagnosis not present

## 2018-12-17 DIAGNOSIS — Z01419 Encounter for gynecological examination (general) (routine) without abnormal findings: Secondary | ICD-10-CM | POA: Diagnosis not present

## 2018-12-22 DIAGNOSIS — K222 Esophageal obstruction: Secondary | ICD-10-CM | POA: Diagnosis not present

## 2018-12-22 DIAGNOSIS — K635 Polyp of colon: Secondary | ICD-10-CM | POA: Diagnosis not present

## 2018-12-22 DIAGNOSIS — K621 Rectal polyp: Secondary | ICD-10-CM | POA: Diagnosis not present

## 2018-12-22 DIAGNOSIS — R1314 Dysphagia, pharyngoesophageal phase: Secondary | ICD-10-CM | POA: Diagnosis not present

## 2018-12-22 DIAGNOSIS — D126 Benign neoplasm of colon, unspecified: Secondary | ICD-10-CM | POA: Diagnosis not present

## 2018-12-22 DIAGNOSIS — K5100 Ulcerative (chronic) pancolitis without complications: Secondary | ICD-10-CM | POA: Diagnosis not present

## 2018-12-22 DIAGNOSIS — D374 Neoplasm of uncertain behavior of colon: Secondary | ICD-10-CM | POA: Diagnosis not present

## 2018-12-22 DIAGNOSIS — K5289 Other specified noninfective gastroenteritis and colitis: Secondary | ICD-10-CM | POA: Diagnosis not present

## 2018-12-22 DIAGNOSIS — K529 Noninfective gastroenteritis and colitis, unspecified: Secondary | ICD-10-CM | POA: Diagnosis not present

## 2018-12-30 DIAGNOSIS — K5289 Other specified noninfective gastroenteritis and colitis: Secondary | ICD-10-CM | POA: Diagnosis not present

## 2018-12-30 DIAGNOSIS — D126 Benign neoplasm of colon, unspecified: Secondary | ICD-10-CM | POA: Diagnosis not present

## 2018-12-30 DIAGNOSIS — K621 Rectal polyp: Secondary | ICD-10-CM | POA: Diagnosis not present

## 2018-12-30 DIAGNOSIS — K635 Polyp of colon: Secondary | ICD-10-CM | POA: Diagnosis not present

## 2018-12-30 DIAGNOSIS — K529 Noninfective gastroenteritis and colitis, unspecified: Secondary | ICD-10-CM | POA: Diagnosis not present

## 2018-12-30 DIAGNOSIS — D374 Neoplasm of uncertain behavior of colon: Secondary | ICD-10-CM | POA: Diagnosis not present

## 2018-12-30 MED FILL — REPATHA SURECLICK 140 MG/ML: 140 | 30 days supply | Qty: 2 | Fill #3

## 2019-01-15 DIAGNOSIS — M5416 Radiculopathy, lumbar region: Secondary | ICD-10-CM | POA: Diagnosis not present

## 2019-01-19 DIAGNOSIS — M25562 Pain in left knee: Secondary | ICD-10-CM | POA: Diagnosis not present

## 2019-01-19 DIAGNOSIS — Z96652 Presence of left artificial knee joint: Secondary | ICD-10-CM | POA: Diagnosis not present

## 2019-01-23 ENCOUNTER — Other Ambulatory Visit: Payer: Self-pay | Admitting: Gastroenterology

## 2019-01-23 DIAGNOSIS — R1031 Right lower quadrant pain: Secondary | ICD-10-CM | POA: Diagnosis not present

## 2019-01-23 DIAGNOSIS — D374 Neoplasm of uncertain behavior of colon: Secondary | ICD-10-CM

## 2019-01-23 DIAGNOSIS — K51018 Ulcerative (chronic) pancolitis with other complication: Secondary | ICD-10-CM | POA: Diagnosis not present

## 2019-01-27 MED FILL — REPATHA SURECLICK 140 MG/ML: 140 | 28 days supply | Qty: 2 | Fill #0

## 2019-01-30 ENCOUNTER — Ambulatory Visit
Admission: RE | Admit: 2019-01-30 | Discharge: 2019-01-30 | Disposition: A | Discharge status: Home / Self Care | Payer: Medicare HMO | Source: Ambulatory Visit | Attending: Gastroenterology | Admitting: Gastroenterology

## 2019-01-30 DIAGNOSIS — K439 Ventral hernia without obstruction or gangrene: Secondary | ICD-10-CM | POA: Diagnosis not present

## 2019-01-30 DIAGNOSIS — R918 Other nonspecific abnormal finding of lung field: Secondary | ICD-10-CM | POA: Diagnosis not present

## 2019-01-30 DIAGNOSIS — K5190 Ulcerative colitis, unspecified, without complications: Secondary | ICD-10-CM | POA: Diagnosis not present

## 2019-01-30 DIAGNOSIS — D374 Neoplasm of uncertain behavior of colon: Secondary | ICD-10-CM

## 2019-01-30 DIAGNOSIS — R102 Pelvic and perineal pain: Secondary | ICD-10-CM | POA: Diagnosis not present

## 2019-01-30 MED ORDER — IOPAMIDOL (ISOVUE-300) INJECTION 61%
100.0000 mL | Freq: Once | INTRAVENOUS | Status: AC | PRN
  Administered 2019-01-30: 100 mL via INTRAVENOUS

## 2019-02-10 DIAGNOSIS — R7309 Other abnormal glucose: Secondary | ICD-10-CM | POA: Diagnosis not present

## 2019-02-10 DIAGNOSIS — I10 Essential (primary) hypertension: Secondary | ICD-10-CM | POA: Diagnosis not present

## 2019-02-10 DIAGNOSIS — E7800 Pure hypercholesterolemia, unspecified: Secondary | ICD-10-CM | POA: Diagnosis not present

## 2019-02-12 DIAGNOSIS — K51919 Ulcerative colitis, unspecified with unspecified complications: Secondary | ICD-10-CM | POA: Diagnosis not present

## 2019-02-13 DIAGNOSIS — I10 Essential (primary) hypertension: Secondary | ICD-10-CM | POA: Diagnosis not present

## 2019-02-13 DIAGNOSIS — D126 Benign neoplasm of colon, unspecified: Secondary | ICD-10-CM | POA: Diagnosis not present

## 2019-02-13 DIAGNOSIS — E7800 Pure hypercholesterolemia, unspecified: Secondary | ICD-10-CM | POA: Diagnosis not present

## 2019-02-18 DIAGNOSIS — H04123 Dry eye syndrome of bilateral lacrimal glands: Secondary | ICD-10-CM | POA: Diagnosis not present

## 2019-02-18 DIAGNOSIS — H02831 Dermatochalasis of right upper eyelid: Secondary | ICD-10-CM | POA: Diagnosis not present

## 2019-02-18 DIAGNOSIS — H02834 Dermatochalasis of left upper eyelid: Secondary | ICD-10-CM | POA: Diagnosis not present

## 2019-02-18 DIAGNOSIS — H40053 Ocular hypertension, bilateral: Secondary | ICD-10-CM | POA: Diagnosis not present

## 2019-02-18 DIAGNOSIS — H2513 Age-related nuclear cataract, bilateral: Secondary | ICD-10-CM | POA: Diagnosis not present

## 2019-02-25 DIAGNOSIS — K639 Disease of intestine, unspecified: Secondary | ICD-10-CM | POA: Diagnosis not present

## 2019-02-25 DIAGNOSIS — K51018 Ulcerative (chronic) pancolitis with other complication: Secondary | ICD-10-CM | POA: Diagnosis not present

## 2019-02-27 MED FILL — REPATHA SURECLICK 140 MG/ML: 140 | 28 days supply | Qty: 2 | Fill #1

## 2019-03-06 DIAGNOSIS — K5190 Ulcerative colitis, unspecified, without complications: Secondary | ICD-10-CM | POA: Diagnosis not present

## 2019-03-11 ENCOUNTER — Ambulatory Visit: Payer: Self-pay | Admitting: Surgery

## 2019-03-11 NOTE — H&P (Signed)
CC: Referred by Dr. Cristina Gong for ulcerative colitis with newly diagnosed DALM/malignant polyp in sigmoid colon  HPI: Donna [name] is a very pleasant 74yoF with hx of HTN, HLD, GERD, osteoarthritis, in long-standing history of ulcerative colitis-72 year history dating back to when she was 74 years old. She denies ever having had a take steroids for this. She has been well controlled from the disease standpoint with sulfasalazine. She denies any history of infusional agents. She has undergone routine surveillance colonoscopies near annually for the last 72 years. She follows with Dr. Cristina Gong currently. She does have chronic back pain. She denies any blood in her stool. She underwent another one of her routine surveillance colonoscopies 12/21/18 which demonstrated diffuse area of mildly erythematous mucosa in the sigmoid and descending and transverse. 6 mm sigmoid polyp found that was flat and removed with the cold snare. Area around the polyp was also biopsied separately. Area was tattooed with spot proximally and distally. 2 flat polyps in the rectum there were 4 mm that were removed. Anal stricture was also found. Pathology returned:  Inactive chronic colitis negative for dysplasia in the proximal colon-no granuloma or inclusions. 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 2. Mildly active chronic colitis negative for dysplasia in the mid colon 3. Low and high-grade dysplasia with focal area suspicious for invasion and the polyp that was removed from the sigmoid. DALM not excluded. 4. Tissue from around the polypectomy site returned with tubulovillous adenoma and low-grade dysplasia 5. Rectosigmoid biopsy returned mildly active chronic colitis negative for dysplasia.  Her pathology is also Glean Salvo out to [facility] for a second pathologist review the findings. By report from Dr.Buccini's office, concordance with our pathologist was maintained and this lesion is  felt to represent a flat area of cancer involving the polyp with surrounding dysplasia that is indistinct. This is not amenable to further polypectomy as per Dr.Buccini. She has been referred for consideration of proctocolectomy.  She has 3 children, one of whom was delivered vaginally. She denies any significant birth trauma seceding with this. She denies any history of incontinence to gas, liquid, or solid stool. Currently, she states she has had diarrhea most of her adult life 3-5 bowel movements per day. This is almost always liquid.   PMH: HTN, HLD, GERD, osteoarthritis, in long-standing history of ulcerative colitis  PSH: Exploratory laparotomy for cecal volvulus, 2017, Dr. Donne Hazel C-section x 2 via Pfannenstiel  FHx: Denies FHx of malignancy or known IBD but she reports her father had GI issues.  Social: Denies use of tobacco/EtOH/drugs. She is retired but previously worked Estate agent at [employer]: A comprehensive 10 system review of systems was completed with the patient and pertinent findings as noted above.  The patient is a 74 year old female.   Past Surgical History (Chanel Teressa Senter, [place]; 03/06/2019 1:31 PM) Cesarean Section - 1  Colon Polyp Removal - Colonoscopy  Colon Polyp Removal - Open  Colon Removal - Partial  Knee Surgery  Left.  Diagnostic Studies History (Chanel Teressa Senter, [place]; 03/06/2019 1:31 PM) Colonoscopy  within last year Mammogram  within last year  Allergies (Chanel Teressa Senter, CMA; 03/06/2019 1:32 PM) Statins Depletion *DIETARY PRODUCTS/DIETARY MANAGEMENT PRODUCTS*  Allergies Reconciled   Medication History (Chanel Teressa Senter, CMA; 03/06/2019 1:34 PM) amLODIPine Besylate (10MG Tablet, Oral) Active. Benazepril-hydroCHLOROthiazide (20-12.5MG Tablet, Oral) Active. Calcium (600MG Tablet, Oral) Active. Carvedilol (3.125MG Tablet, Oral) Active. Pantoprazole Sodium (40MG Tablet DR, Oral) Active. Repatha SureClick (580DX/IP Soln Auto-inj,  Subcutaneous) Active. sulfaSALAzine (  500MG Tablet, Oral) Active. traMADol HCl (50MG Tablet, Oral) Active. Folic Acid (1MG Tablet, Oral) Active. Medications Reconciled  Social History (Chanel Teressa Senter, CMA; 03/06/2019 1:31 PM) Caffeine use  Carbonated beverages, Coffee, Tea. No alcohol use  No drug use  Tobacco use  Never smoker.  Family History ([place], [place]; 03/06/2019 1:31 PM) Alcohol Abuse  Father. Arthritis  Father, Mother. Diabetes Mellitus  Mother, Sister. Heart Disease  Mother, Sister. Hypertension  Mother, Sister. Ischemic Bowel Disease  Father.  Pregnancy / Birth History Antonietta Jewel, [place]; 03/06/2019 1:31 PM) Age at menarche  72 years. Age of menopause  14-50 Gravida  3 Maternal age  63-25 15-20 Para  3  Other Problems (Chanel Teressa Senter, [place]; 03/06/2019 1:31 PM) Colon Cancer  Heart murmur  High blood pressure  Hypercholesterolemia  Inguinal Hernia  Ulcerative Colitis     Review of Systems (Chanel Nolan CMA; 03/06/2019 1:31 PM) General Present- Fatigue. Not Present- Appetite Loss, Chills, Fever, Night Sweats, Weight Gain and Weight Loss. Skin Not Present- Change in Wart/Mole, Dryness, Hives, Jaundice, New Lesions, Non-Healing Wounds, Rash and Ulcer. HEENT Present- Wears glasses/contact lenses. Not Present- Earache, Hearing Loss, Hoarseness, Nose Bleed, Oral Ulcers, Ringing in the Ears, Seasonal Allergies, Sinus Pain, Sore Throat, Visual Disturbances and Yellow Eyes. Respiratory Not Present- Bloody sputum, Chronic Cough, Difficulty Breathing, Snoring and Wheezing. Cardiovascular Not Present- Chest Pain, Difficulty Breathing Lying Down, Leg Cramps, Palpitations, Rapid Heart Rate, Shortness of Breath and Swelling of Extremities. Gastrointestinal Present- Abdominal Pain, Bloating, Excessive gas and Gets full quickly at meals. Not Present- Bloody Stool, Change in Bowel Habits, Chronic diarrhea, Constipation, Difficulty Swallowing, Hemorrhoids, Indigestion,  Nausea, Rectal Pain and Vomiting. Female Genitourinary Not Present- Frequency, Nocturia, Painful Urination, Pelvic Pain and Urgency. Neurological Present- Numbness and Trouble walking. Not Present- Decreased Memory, Fainting, Headaches, Seizures, Tingling, Tremor and Weakness. Psychiatric Not Present- Anxiety, Bipolar, Change in Sleep Pattern, Depression, Fearful and Frequent crying. Endocrine Present- Hot flashes. Not Present- Cold Intolerance, Excessive Hunger, Hair Changes, Heat Intolerance and New Diabetes. Hematology Not Present- Blood Thinners, Easy Bruising, Excessive bleeding, Gland problems, HIV and Persistent Infections.  Vitals (Chanel Nolan CMA; 03/06/2019 1:34 PM) 03/06/2019 1:34 PM Weight: 191.5 lb Height: 65in Body Surface Area: 1.94 m Body Mass Index: 31.87 kg/m  Temp.: 97.55F  Pulse: 87 (Regular)  BP: 132/74 (Sitting, Left Arm, Standard)       Physical Exam [place] Gave M. Vishnu Moeller MD; 03/06/2019 2:25 PM) The physical exam findings are as follows: Note:Constitutional: No acute distress; conversant; no deformities; wearing mask Eyes: Moist conjunctiva; no lid lag; anicteric sclerae; pupils equal round and reactive to light Neck: Trachea midline; no palpable thyromegaly Lungs: Normal respiratory effort; no tactile fremitus CV: rrr; no palpable thrill; no pitting edema GI: Abdomen soft, nontender, nondistended; no palpable hepatosplenomegaly Anorectal: No perianal skin changes or tags. DRE-increased tone, no palpable masses. No palpable anal stricture. MSK: Normal gait; no clubbing/cyanosis Psychiatric: Appropriate affect; alert and oriented 3 Lymphatic: No palpable cervical or axillary lymphadenopathy **A chaperone, [employer], was present for this encounter    Assessment & Plan [place] Gave M. Raekwan Spelman MD; 03/06/2019 2:30 PM) ULCERATIVE COLITIS (K51.90) Story: Ms. Buckingham is a very pleasant 32yoF with long standing UC, HTN, HLD, GERD, back/left knee  pain/arthritis - here for evaluation for total proctocolectomy in the setting of her most recent endoscopic exam 12/21/18 demonstrating a flat sigmoid polyp with at least carcinoma in situ if not an invasive component and surrounding adenomatous tissue (DALM) not amenable to further endoscopic resection due to  indistinct margins.  -The anatomy and physiology of the GI tract was discussed at length with the patient. The pathophysiology of UC and colon cancer was discussed at length with associated pictures. -I spent time today reviewing with her endoscopic findings, pathology reports from both pathologists, personally reviewing her imaging, and discussing long-term management of patients with ulcerative colitis particularly as it pertains to surgical options. Impression: -We had a long discussion about all potential options moving forward including restorative proctocolectomy with J-pouch versus total proctocolectomy/end ileostomy. We discussed quality of life therein. We discussed and select patients of her age, the potential option for J-pouch and expectations therein. Should this be pursued, obtaining anorectal manometry prior to making this a final decision. -She has done much research on her own and has her to come to the conclusion prior to this visit that she was interested in pursuing total proctocolectomy/end ileostomy. We discussed primary ventral hernia repair at the same time without use of mesh due to the contaminated nature of the procedure. We discussed minimally invasive/robotic and possible open techniques to her surgery. We discussed that ileostomy would be permanent and quality of life considerations and risks therein. -The planned procedures, material risks (including, but not limited to, pain, bleeding, infection, scarring, need for blood transfusion, damage to surrounding structures- blood vessels/nerves/viscus/organs, damage to ureter, urine leak, leak from rectal stump, need for  additional procedures, worsening of pre-existing medical conditions, recurrent hernia, recurrence of cancer, DVT/PE, pneumonia, heart attack, stroke, death) benefits and alternatives to surgery were discussed at length. The patient's questions were answered to her satisfaction, she voiced understanding and elected to proceed with surgery. Additionally, we discussed typical postoperative expectations and the recovery process.  This patient encounter took 70 minutes today to perform the following: take history, perform exam, review outside records, interpret imaging, counsel the patient on their diagnosis and document encounter, findings & plan in the EHR  Signed by Ileana Roup, MD (03/06/2019 2:30 PM)

## 2019-03-25 MED FILL — REPATHA SURECLICK 140 MG/ML: 140 | 28 days supply | Qty: 2 | Fill #2

## 2019-04-08 NOTE — Progress Notes (Signed)
PCP - Deland Pretty Cardiologist -   Chest x-ray - 01-30-19 EKG -  Stress Test -  ECHO -  Cardiac Cath -   Sleep Study -  CPAP -   Fasting Blood Sugar -  Checks Blood Sugar _____ times a day  Blood Thinner Instructions: Aspirin Instructions: Last Dose:  Anesthesia review:   Patient denies shortness of breath, fever, cough and chest pain at PAT appointment   Patient verbalized understanding of instructions that were given to them at the PAT appointment. Patient was also instructed that they will need to review over the PAT instructions again at home before surgery.

## 2019-04-08 NOTE — Patient Instructions (Signed)
DUE TO COVID-19 ONLY ONE VISITOR IS ALLOWED TO COME WITH YOU AND STAY IN THE WAITING ROOM ONLY DURING PRE OP AND PROCEDURE DAY OF SURGERY. THE 1 VISITOR MAY VISIT WITH YOU AFTER SURGERY IN YOUR PRIVATE ROOM DURING VISITING HOURS ONLY!  YOU NEED TO HAVE A COVID 19 TEST ON 04-20-19 @_______ , THIS TEST MUST BE DONE BEFORE SURGERY, COME  [place] , [address].  ([place]) ONCE YOUR COVID TEST IS COMPLETED, PLEASE BEGIN THE QUARANTINE INSTRUCTIONS AS OUTLINED IN YOUR HANDOUT.                Altha Sweitzer GEZMOQ  04/08/2019   Your procedure is scheduled on: 04-23-19   Report to [facility] Main  Entrance    Report to Short Stay at 5:30 AM     Call this number if you have problems the morning of surgery [phone]    Please consume a Clear Liquid Diet the day of your prep, per your surgeon's instructions   Remember: DRINK 2 PRESURGERY ENSURE DRINKS THE NIGHT BEFORE SURGERY AT 1000 PM AND 1 PRESURGERY DRINK THE DAY OF THE PROCEDURE 3 HOURS PRIOR TO SCHEDULED SURGERY.  NOTHING BY MOUTH EXCEPT CLEAR LIQUIDS UNTIL THREE HOURS PRIOR TO SCHEDULED SURGERY. PLEASE FINISH PRESURGERY ENSURE DRINK PER SURGEON ORDER 3 HOURS PRIOR TO SCHEDULED SURGERY TIME WHICH NEEDS TO BE COMPLETED AT 4:30 AM_.     CLEAR LIQUID DIET   Foods Allowed                                                                     Foods Excluded  Coffee and tea, regular and decaf                             liquids that you cannot  Plain Jell-O any favor except red or purple                                           see through such as: Fruit ices (not with fruit pulp)                                     milk, soups, orange juice  Iced Popsicles                                    All solid food Carbonated beverages, regular and diet                                    Cranberry, grape and apple juices Sports drinks like Gatorade Lightly seasoned clear broth or consume(fat free) Sugar, honey  syrup  Sample Menu Breakfast                                Lunch  Supper Cranberry juice                    Beef broth                            Chicken broth Jell-O                                     Grape juice                           Apple juice Coffee or tea                        Jell-O                                      Popsicle                                                Coffee or tea                        Coffee or tea  _____________________________________________________________________    Take these medicines the morning of surgery with A SIP OF WATER: Amlodipine (Norvasc)  BRUSH YOUR TEETH MORNING OF SURGERY AND RINSE YOUR MOUTH OUT, NO CHEWING GUM CANDY OR MINTS.                            You may not have any metal on your body including hair pins and              piercings     Do not wear jewelry, make-up, lotions, powders or perfumes, deodorant              Do not wear nail polish on your fingernails.  Do not shave  48 hours prior to surgery.                Do not bring valuables to the hospital. [place].  Contacts, dentures or bridgework may not be worn into surgery.  You may bring an overnight bag     Special Instructions: N/A              Please read over the following fact sheets you were given: _____________________________________________________________________             [facility] - Preparing for Surgery Before surgery, you can play an important role.  Because skin is not sterile, your skin needs to be as free of germs as possible.  You can reduce the number of germs on your skin by washing with CHG (chlorahexidine gluconate) soap before surgery.  CHG is an antiseptic cleaner which kills germs and bonds with the skin to continue killing germs even after washing. Please DO NOT use if you have an allergy to CHG or antibacterial soaps.  If your skin becomes  reddened/irritated stop using the CHG and inform  your nurse when you arrive at Short Stay. Do not shave (including legs and underarms) for at least 48 hours prior to the first CHG shower.  You may shave your face/neck. Please follow these instructions carefully:  1.  Shower with CHG Soap the night before surgery and the  morning of Surgery.  2.  If you choose to wash your hair, wash your hair first as usual with your  normal  shampoo.  3.  After you shampoo, rinse your hair and body thoroughly to remove the  shampoo.                           4.  Use CHG as you would any other liquid soap.  You can apply chg directly  to the skin and wash                       Gently with a scrungie or clean washcloth.  5.  Apply the CHG Soap to your body ONLY FROM THE NECK DOWN.   Do not use on face/ open                           Wound or open sores. Avoid contact with eyes, ears mouth and genitals (private parts).                       Wash face,  Genitals (private parts) with your normal soap.             6.  Wash thoroughly, paying special attention to the area where your surgery  will be performed.  7.  Thoroughly rinse your body with warm water from the neck down.  8.  DO NOT shower/wash with your normal soap after using and rinsing off  the CHG Soap.                9.  Pat yourself dry with a clean towel.            10.  Wear clean pajamas.            11.  Place clean sheets on your bed the night of your first shower and do not  sleep with pets. Day of Surgery : Do not apply any lotions/deodorants the morning of surgery.  Please wear clean clothes to the hospital/surgery center.  FAILURE TO FOLLOW THESE INSTRUCTIONS MAY RESULT IN THE CANCELLATION OF YOUR SURGERY PATIENT SIGNATURE_________________________________  NURSE SIGNATURE__________________________________  ________________________________________________________________________   Adam Phenix  An incentive spirometer is a tool that  can help keep your lungs clear and active. This tool measures how well you are filling your lungs with each breath. Taking long deep breaths may help reverse or decrease the chance of developing breathing (pulmonary) problems (especially infection) following:  A long period of time when you are unable to move or be active. BEFORE THE PROCEDURE   If the spirometer includes an indicator to show your best effort, your nurse or respiratory therapist will set it to a desired goal.  If possible, sit up straight or lean slightly forward. Try not to slouch.  Hold the incentive spirometer in an upright position. INSTRUCTIONS FOR USE  1. Sit on the edge of your bed if possible, or sit up as far as you can in bed or on a chair. 2. Hold the incentive spirometer in an upright position. 3.  Breathe out normally. 4. Place the mouthpiece in your mouth and seal your lips tightly around it. 5. Breathe in slowly and as deeply as possible, raising the piston or the ball toward the top of the column. 6. Hold your breath for 3-5 seconds or for as long as possible. Allow the piston or ball to fall to the bottom of the column. 7. Remove the mouthpiece from your mouth and breathe out normally. 8. Rest for a few seconds and repeat Steps 1 through 7 at least 10 times every 1-2 hours when you are awake. Take your time and take a few normal breaths between deep breaths. 9. The spirometer may include an indicator to show your best effort. Use the indicator as a goal to work toward during each repetition. 10. After each set of 10 deep breaths, practice coughing to be sure your lungs are clear. If you have an incision (the cut made at the time of surgery), support your incision when coughing by placing a pillow or rolled up towels firmly against it. Once you are able to get out of bed, walk around indoors and cough well. You may stop using the incentive spirometer when instructed by your caregiver.  RISKS AND  COMPLICATIONS  Take your time so you do not get dizzy or light-headed.  If you are in pain, you may need to take or ask for pain medication before doing incentive spirometry. It is harder to take a deep breath if you are having pain. AFTER USE  Rest and breathe slowly and easily.  It can be helpful to keep track of a log of your progress. Your caregiver can provide you with a simple table to help with this. If you are using the spirometer at home, follow these instructions: [place] IF:   You are having difficultly using the spirometer.  You have trouble using the spirometer as often as instructed.  Your pain medication is not giving enough relief while using the spirometer.  You develop fever of 100.5 F (38.1 C) or higher. SEEK IMMEDIATE MEDICAL CARE IF:   You cough up bloody sputum that had not been present before.  You develop fever of 102 F (38.9 C) or greater.  You develop worsening pain at or near the incision site. MAKE SURE YOU:   Understand these instructions.  Will watch your condition.  Will get help right away if you are not doing well or get worse. Document Released: 04/30/2006 Document Revised: 03/12/2011 Document Reviewed: 07/01/2006 [employer] Patient Information 2014 [employer], [place].   ________________________________________________________________________  WHAT IS A BLOOD TRANSFUSION? Blood Transfusion Information  A transfusion is the replacement of blood or some of its parts. Blood is made up of multiple cells which provide different functions.  Red blood cells carry oxygen and are used for blood loss replacement.  White blood cells fight against infection.  Platelets control bleeding.  Plasma helps clot blood.  Other blood products are available for specialized needs, such as hemophilia or other clotting disorders. BEFORE THE TRANSFUSION  Who gives blood for transfusions?   Healthy volunteers who are fully evaluated to make sure  their blood is safe. This is blood bank blood. Transfusion therapy is the safest it has ever been in the practice of medicine. Before blood is taken from a donor, a complete history is taken to make sure that person has no history of diseases nor engages in risky social behavior (examples are intravenous drug use or sexual activity with multiple partners). The  donor's travel history is screened to minimize risk of transmitting infections, such as malaria. The donated blood is tested for signs of infectious diseases, such as HIV and hepatitis. The blood is then tested to be sure it is compatible with you in order to minimize the chance of a transfusion reaction. If you or a relative donates blood, this is often done in anticipation of surgery and is not appropriate for emergency situations. It takes many days to process the donated blood. RISKS AND COMPLICATIONS Although transfusion therapy is very safe and saves many lives, the main dangers of transfusion include:   Getting an infectious disease.  Developing a transfusion reaction. This is an allergic reaction to something in the blood you were given. Every precaution is taken to prevent this. The decision to have a blood transfusion has been considered carefully by your caregiver before blood is given. Blood is not given unless the benefits outweigh the risks. AFTER THE TRANSFUSION  Right after receiving a blood transfusion, you will usually feel much better and more energetic. This is especially true if your red blood cells have gotten low (anemic). The transfusion raises the level of the red blood cells which carry oxygen, and this usually causes an energy increase.  The nurse administering the transfusion will monitor you carefully for complications. HOME CARE INSTRUCTIONS  No special instructions are needed after a transfusion. You may find your energy is better. Speak with your caregiver about any limitations on activity for underlying diseases  you may have. SEEK MEDICAL CARE IF:   Your condition is not improving after your transfusion.  You develop redness or irritation at the intravenous (IV) site. SEEK IMMEDIATE MEDICAL CARE IF:  Any of the following symptoms occur over the next 12 hours:  Shaking chills.  You have a temperature by mouth above 102 F (38.9 C), not controlled by medicine.  Chest, back, or muscle pain.  People around you feel you are not acting correctly or are confused.  Shortness of breath or difficulty breathing.  Dizziness and fainting.  You get a rash or develop hives.  You have a decrease in urine output.  Your urine turns a dark color or changes to pink, red, or brown. Any of the following symptoms occur over the next 10 days:  You have a temperature by mouth above 102 F (38.9 C), not controlled by medicine.  Shortness of breath.  Weakness after normal activity.  The white part of the eye turns yellow (jaundice).  You have a decrease in the amount of urine or are urinating less often.  Your urine turns a dark color or changes to pink, red, or brown. Document Released: 12/16/1999 Document Revised: 03/12/2011 Document Reviewed: 08/04/2007 [employer] Patient Information 2014 [place], [place].  _______________________________________________________________________

## 2019-04-15 ENCOUNTER — Encounter (HOSPITAL_COMMUNITY): Admission: RE | Admit: 2019-04-15 | Payer: Medicare HMO | Source: Ambulatory Visit

## 2019-04-15 ENCOUNTER — Encounter (HOSPITAL_COMMUNITY)
Admission: RE | Admit: 2019-04-15 | Discharge: 2019-04-15 | Disposition: A | Discharge status: Home / Self Care | Payer: Medicare HMO | Source: Ambulatory Visit | Attending: Surgery | Admitting: Surgery

## 2019-04-20 ENCOUNTER — Other Ambulatory Visit (HOSPITAL_COMMUNITY): Payer: Medicare HMO

## 2019-04-23 MED FILL — REPATHA SURECLICK 140 MG/ML: 140 | 28 days supply | Qty: 2 | Fill #0

## 2019-04-29 ENCOUNTER — Other Ambulatory Visit: Payer: Self-pay | Admitting: Internal Medicine

## 2019-04-29 DIAGNOSIS — Z1231 Encounter for screening mammogram for malignant neoplasm of breast: Secondary | ICD-10-CM

## 2019-04-30 DIAGNOSIS — M67912 Unspecified disorder of synovium and tendon, left shoulder: Secondary | ICD-10-CM | POA: Diagnosis not present

## 2019-04-30 DIAGNOSIS — M67911 Unspecified disorder of synovium and tendon, right shoulder: Secondary | ICD-10-CM | POA: Diagnosis not present

## 2019-04-30 DIAGNOSIS — M75111 Incomplete rotator cuff tear or rupture of right shoulder, not specified as traumatic: Secondary | ICD-10-CM | POA: Diagnosis not present

## 2019-05-11 DIAGNOSIS — N183 Chronic kidney disease, stage 3 unspecified: Secondary | ICD-10-CM | POA: Diagnosis not present

## 2019-05-11 DIAGNOSIS — I10 Essential (primary) hypertension: Secondary | ICD-10-CM | POA: Diagnosis not present

## 2019-05-11 DIAGNOSIS — Z01818 Encounter for other preprocedural examination: Secondary | ICD-10-CM | POA: Diagnosis not present

## 2019-05-11 DIAGNOSIS — R7309 Other abnormal glucose: Secondary | ICD-10-CM | POA: Diagnosis not present

## 2019-05-11 DIAGNOSIS — K5190 Ulcerative colitis, unspecified, without complications: Secondary | ICD-10-CM | POA: Diagnosis not present

## 2019-05-20 NOTE — Patient Instructions (Addendum)
DUE TO COVID-19 ONLY ONE VISITOR IS ALLOWED TO COME WITH YOU AND STAY IN THE WAITING ROOM ONLY DURING PRE OP AND PROCEDURE DAY OF SURGERY. THE 1 VISITOR MAY VISIT WITH YOU AFTER SURGERY IN YOUR PRIVATE ROOM DURING VISITING HOURS ONLY!  YOU NEED TO HAVE A COVID 19 TEST ON: 05/26/19 @8 :45 am, THIS TEST MUST BE DONE BEFORE SURGERY, COME  [place], [place] , [address].  ([place]) ONCE YOUR COVID TEST IS COMPLETED, PLEASE BEGIN THE QUARANTINE INSTRUCTIONS AS OUTLINED IN YOUR HANDOUT.                Tandy Grawe [id number]    Your procedure is scheduled on: 05/29/19   Report to [facility] Main  Entrance   Report to admitting at: 5:30 AM     Call this number if you have problems the morning of surgery [phone]    Remember:   DRINK 2 PRESURGERY ENSURE DRINKS THE NIGHT BEFORE SURGERY AT  1000 PM AND 1 PRESURGERY DRINK THE DAY OF THE PROCEDURE 3 HOURS PRIOR TO SCHEDULED SURGERY. NO SOLIDS AFTER MIDNIGHT THE DAY PRIOR TO THE SURGERY. NOTHING BY MOUTH EXCEPT CLEAR LIQUIDS UNTIL THREE HOURS PRIOR TO SCHEDULED SURGERY. PLEASE FINISH PRESURGERY ENSURE DRINK PER SURGEON ORDER 3 HOURS PRIOR TO SCHEDULED SURGERY TIME WHICH NEEDS TO BE COMPLETED AT: 4: 15 am.    CLEAR LIQUID DIET   Foods Allowed                                                                     Foods Excluded  Coffee and tea, regular and decaf                             liquids that you cannot  Plain Jell-O any favor except red or purple                                           see through such as: Fruit ices (not with fruit pulp)                                     milk, soups, orange juice  Iced Popsicles                                    All solid food Carbonated beverages, regular and diet                                    Cranberry, grape and apple juices Sports drinks like Gatorade Lightly seasoned clear broth or consume(fat free) Sugar, honey syrup  Sample Menu Breakfast                                 Lunch  Supper Cranberry juice                    Beef broth                            Chicken broth Jell-O                                     Grape juice                           Apple juice Coffee or tea                        Jell-O                                      Popsicle                                                Coffee or tea                        Coffee or tea  _____________________________________________________________________  DRINK PLENTY FLUIDS THE DAY OF THE PREP<AS PER YOUR SURGEON ORDERS.  BRUSH YOUR TEETH MORNING OF SURGERY AND RINSE YOUR MOUTH OUT, NO CHEWING GUM CANDY OR MINTS.     Take these medicines the morning of surgery with A SIP OF WATER: amlodipine,carvedilol,pantoprazole,sulfasalazine.               You may not have any metal on your body including hair pins and              piercings  Do not wear jewelry, make-up, lotions, powders or perfumes, deodorant             Do not wear nail polish on your fingernails.  Do not shave  48 hours prior to surgery.               Do not bring valuables to the hospital. [place].  Contacts, dentures or bridgework may not be worn into surgery.  Leave suitcase in the car. After surgery it may be brought to your room.     Patients discharged the day of surgery will not be allowed to drive home. IF YOU ARE HAVING SURGERY AND GOING HOME THE SAME DAY, YOU MUST HAVE AN ADULT TO DRIVE YOU HOME AND BE WITH YOU FOR 24 HOURS. YOU MAY GO HOME BY TAXI OR UBER OR ORTHERWISE, BUT AN ADULT MUST ACCOMPANY YOU HOME AND STAY WITH YOU FOR 24 HOURS.  Name and phone number of your driver:  Special Instructions: N/A              Please read over the following fact sheets you were given: _____________________________________________________________________   [facility] - Preparing for Surgery Before surgery, you can play an  important role.  Because skin is not sterile, your skin needs to be as free of germs as possible.  You can reduce the number of germs on your skin by washing with CHG (chlorahexidine gluconate) soap before surgery.  CHG is an antiseptic cleaner which kills germs and bonds with the skin to continue killing germs even after washing. Please DO NOT use if you have an allergy to CHG or antibacterial soaps.  If your skin becomes reddened/irritated stop using the CHG and inform your nurse when you arrive at Short Stay. Do not shave (including legs and underarms) for at least 48 hours prior to the first CHG shower.  You may shave your face/neck. Please follow these instructions carefully:  1.  Shower with CHG Soap the night before surgery and the  morning of Surgery.  2.  If you choose to wash your hair, wash your hair first as usual with your  normal  shampoo.  3.  After you shampoo, rinse your hair and body thoroughly to remove the  shampoo.                           4.  Use CHG as you would any other liquid soap.  You can apply chg directly  to the skin and wash                       Gently with a scrungie or clean washcloth.  5.  Apply the CHG Soap to your body ONLY FROM THE NECK DOWN.   Do not use on face/ open                           Wound or open sores. Avoid contact with eyes, ears mouth and genitals (private parts).                       Wash face,  Genitals (private parts) with your normal soap.             6.  Wash thoroughly, paying special attention to the area where your surgery  will be performed.  7.  Thoroughly rinse your body with warm water from the neck down.  8.  DO NOT shower/wash with your normal soap after using and rinsing off  the CHG Soap.                9.  Pat yourself dry with a clean towel.            10.  Wear clean pajamas.            11.  Place clean sheets on your bed the night of your first shower and do not  sleep with pets. Day of Surgery : Do not apply any  lotions/deodorants the morning of surgery.  Please wear clean clothes to the hospital/surgery center.  FAILURE TO FOLLOW THESE INSTRUCTIONS MAY RESULT IN THE CANCELLATION OF YOUR SURGERY PATIENT SIGNATURE_________________________________  NURSE SIGNATURE__________________________________  ________________________________________________________________________   Adam Phenix  An incentive spirometer is a tool that can help keep your lungs clear and active. This tool measures how well you are filling your lungs with each breath. Taking long deep breaths may help reverse or decrease the chance of developing breathing (pulmonary) problems (especially infection) following:  A long period of time when you are unable to move or be active. BEFORE THE PROCEDURE   If the spirometer includes an indicator to show your best effort, your nurse or respiratory therapist will set  it to a desired goal.  If possible, sit up straight or lean slightly forward. Try not to slouch.  Hold the incentive spirometer in an upright position. INSTRUCTIONS FOR USE  1. Sit on the edge of your bed if possible, or sit up as far as you can in bed or on a chair. 2. Hold the incentive spirometer in an upright position. 3. Breathe out normally. 4. Place the mouthpiece in your mouth and seal your lips tightly around it. 5. Breathe in slowly and as deeply as possible, raising the piston or the ball toward the top of the column. 6. Hold your breath for 3-5 seconds or for as long as possible. Allow the piston or ball to fall to the bottom of the column. 7. Remove the mouthpiece from your mouth and breathe out normally. 8. Rest for a few seconds and repeat Steps 1 through 7 at least 10 times every 1-2 hours when you are awake. Take your time and take a few normal breaths between deep breaths. 9. The spirometer may include an indicator to show your best effort. Use the indicator as a goal to work toward during each  repetition. 10. After each set of 10 deep breaths, practice coughing to be sure your lungs are clear. If you have an incision (the cut made at the time of surgery), support your incision when coughing by placing a pillow or rolled up towels firmly against it. Once you are able to get out of bed, walk around indoors and cough well. You may stop using the incentive spirometer when instructed by your caregiver.  RISKS AND COMPLICATIONS  Take your time so you do not get dizzy or light-headed.  If you are in pain, you may need to take or ask for pain medication before doing incentive spirometry. It is harder to take a deep breath if you are having pain. AFTER USE  Rest and breathe slowly and easily.  It can be helpful to keep track of a log of your progress. Your caregiver can provide you with a simple table to help with this. If you are using the spirometer at home, follow these instructions: [place] IF:   You are having difficultly using the spirometer.  You have trouble using the spirometer as often as instructed.  Your pain medication is not giving enough relief while using the spirometer.  You develop fever of 100.5 F (38.1 C) or higher. SEEK IMMEDIATE MEDICAL CARE IF:   You cough up bloody sputum that had not been present before.  You develop fever of 102 F (38.9 C) or greater.  You develop worsening pain at or near the incision site. MAKE SURE YOU:   Understand these instructions.  Will watch your condition.  Will get help right away if you are not doing well or get worse. Document Released: 04/30/2006 Document Revised: 03/12/2011 Document Reviewed: 07/01/2006 [facility] Patient Information 2014 [place], [place].   ________________________________________________________________________

## 2019-05-21 ENCOUNTER — Encounter (HOSPITAL_COMMUNITY): Payer: Self-pay

## 2019-05-21 ENCOUNTER — Encounter (HOSPITAL_COMMUNITY)
Admission: RE | Admit: 2019-05-21 | Discharge: 2019-05-21 | Disposition: A | Discharge status: Home / Self Care | Payer: Medicare HMO | Source: Ambulatory Visit | Attending: Surgery | Admitting: Surgery

## 2019-05-21 ENCOUNTER — Other Ambulatory Visit: Payer: Self-pay

## 2019-05-21 DIAGNOSIS — Z01812 Encounter for preprocedural laboratory examination: Secondary | ICD-10-CM | POA: Insufficient documentation

## 2019-05-21 HISTORY — DX: Malignant (primary) neoplasm, unspecified: C80.1

## 2019-05-21 HISTORY — DX: Prediabetes: R73.03

## 2019-05-21 LAB — CBC WITH DIFFERENTIAL/PLATELET
Abs Immature Granulocytes: 0.01 10*3/uL (ref 0.00–0.07)
Basophils Absolute: 0 10*3/uL (ref 0.0–0.1)
Basophils Relative: 1 %
Eosinophils Absolute: 0.2 10*3/uL (ref 0.0–0.5)
Eosinophils Relative: 4 %
HCT: 36.4 % (ref 36.0–46.0)
Hemoglobin: 12 g/dL (ref 12.0–15.0)
Immature Granulocytes: 0 %
Lymphocytes Relative: 23 %
Lymphs Abs: 0.9 10*3/uL (ref 0.7–4.0)
MCH: 31.2 pg (ref 26.0–34.0)
MCHC: 33 g/dL (ref 30.0–36.0)
MCV: 94.5 fL (ref 80.0–100.0)
Monocytes Absolute: 0.5 10*3/uL (ref 0.1–1.0)
Monocytes Relative: 12 %
Neutro Abs: 2.3 10*3/uL (ref 1.7–7.7)
Neutrophils Relative %: 60 %
Platelets: 203 10*3/uL (ref 150–400)
RBC: 3.85 MIL/uL — ABNORMAL LOW (ref 3.87–5.11)
RDW: 14.2 % (ref 11.5–15.5)
WBC: 3.8 10*3/uL — ABNORMAL LOW (ref 4.0–10.5)
nRBC: 0 % (ref 0.0–0.2)

## 2019-05-21 LAB — HEMOGLOBIN A1C
Hgb A1c MFr Bld: 5 % (ref 4.8–5.6)
Mean Plasma Glucose: 96.8 mg/dL

## 2019-05-21 LAB — COMPREHENSIVE METABOLIC PANEL
ALT: 22 U/L (ref 0–44)
AST: 26 U/L (ref 15–41)
Albumin: 4.4 g/dL (ref 3.5–5.0)
Alkaline Phosphatase: 69 U/L (ref 38–126)
Anion gap: 7 (ref 5–15)
BUN: 16 mg/dL (ref 8–23)
CO2: 30 mmol/L (ref 22–32)
Calcium: 9.6 mg/dL (ref 8.9–10.3)
Chloride: 106 mmol/L (ref 98–111)
Creatinine, Ser: 0.97 mg/dL (ref 0.44–1.00)
GFR calc Af Amer: >60 mL/min (ref >60)
GFR calc non Af Amer: 58 mL/min — ABNORMAL LOW (ref >60)
Glucose, Bld: 106 mg/dL — ABNORMAL HIGH (ref 70–99)
Potassium: 3.9 mmol/L (ref 3.5–5.1)
Sodium: 143 mmol/L (ref 135–145)
Total Bilirubin: 0.5 mg/dL (ref 0.3–1.2)
Total Protein: 7.1 g/dL (ref 6.5–8.1)

## 2019-05-21 LAB — ABO/RH: ABO/RH(D): A POS

## 2019-05-21 MED FILL — REPATHA SURECLICK 140 MG/ML: 140 | 28 days supply | Qty: 2 | Fill #1

## 2019-05-21 NOTE — Progress Notes (Signed)
PCP - Dr. Deland Pretty Cardiologist -   Chest x-ray - 06/17/18 EKG - 06/17/18 Stress Test -  ECHO -  Cardiac Cath -   Sleep Study -  CPAP -   Fasting Blood Sugar -  Checks Blood Sugar _____ times a day  Blood Thinner Instructions: Aspirin Instructions: Last Dose:  Anesthesia review: Hx. HTN,heart murmur  Patient denies shortness of breath, fever, cough and chest pain at PAT appointment   Patient verbalized understanding of instructions that were given to them at the PAT appointment. Patient was also instructed that they will need to review over the PAT instructions again at home before surgery.

## 2019-05-21 NOTE — Consult Note (Signed)
[facility] Nurse ostomy consult note  [facility] Nurse requested for preoperative stoma site marking by Dr. Dema Severin.  Discussed surgical procedure and stoma creation with patient.  Explained role of the [facility] nurse team.  Answered patient questions. She is prayerful that no stoma will be required during her surgery.  Examined patient sitting and standing in order to place the marking in the patient's visual field, away from any creases or abdominal contour issues and within the rectus muscle.  Both marks are in the upper quadrants as the patient has numerous folds and creases and would not be able to see stomas in the lower quadrants for self care.  Marked for colostomy in the LUQ  7cm to the left of the umbilicus and 6cm above the umbilicus.  Marked for ileostomy in the RUQ  7cm to the right of the umbilicus and 6.5 cm above the umbilicus.  Patient's abdomen cleansed with CHG wipes at site markings, allowed to air dry prior to marking.Covered marks with thin film transparent dressings to preserve mark until date of surgery (05/29/19).   [facility] Nurse team will follow up with patient after surgery for continue ostomy care and teaching should a stoma be created intraoperatively. If that is the case, please reconsult.  Thanks,  Maudie Flakes, MSN, RN, [place], Arther Abbott  Pager# [phone]

## 2019-05-26 ENCOUNTER — Other Ambulatory Visit (HOSPITAL_COMMUNITY)
Admission: RE | Admit: 2019-05-26 | Discharge: 2019-05-26 | Disposition: A | Discharge status: Home / Self Care | Payer: Medicare HMO | Source: Ambulatory Visit | Attending: Surgery | Admitting: Surgery

## 2019-05-26 DIAGNOSIS — G8929 Other chronic pain: Secondary | ICD-10-CM | POA: Diagnosis present

## 2019-05-26 DIAGNOSIS — Z96652 Presence of left artificial knee joint: Secondary | ICD-10-CM | POA: Diagnosis present

## 2019-05-26 DIAGNOSIS — I10 Essential (primary) hypertension: Secondary | ICD-10-CM | POA: Diagnosis present

## 2019-05-26 DIAGNOSIS — Z20822 Contact with and (suspected) exposure to covid-19: Secondary | ICD-10-CM | POA: Diagnosis present

## 2019-05-26 DIAGNOSIS — C187 Malignant neoplasm of sigmoid colon: Secondary | ICD-10-CM | POA: Diagnosis present

## 2019-05-26 DIAGNOSIS — C189 Malignant neoplasm of colon, unspecified: Secondary | ICD-10-CM | POA: Diagnosis not present

## 2019-05-26 DIAGNOSIS — E785 Hyperlipidemia, unspecified: Secondary | ICD-10-CM | POA: Diagnosis present

## 2019-05-26 DIAGNOSIS — K432 Incisional hernia without obstruction or gangrene: Secondary | ICD-10-CM | POA: Diagnosis present

## 2019-05-26 DIAGNOSIS — K5190 Ulcerative colitis, unspecified, without complications: Secondary | ICD-10-CM | POA: Diagnosis present

## 2019-05-26 DIAGNOSIS — Z01812 Encounter for preprocedural laboratory examination: Secondary | ICD-10-CM | POA: Insufficient documentation

## 2019-05-26 DIAGNOSIS — K660 Peritoneal adhesions (postprocedural) (postinfection): Secondary | ICD-10-CM | POA: Diagnosis not present

## 2019-05-26 DIAGNOSIS — C188 Malignant neoplasm of overlapping sites of colon: Secondary | ICD-10-CM | POA: Diagnosis not present

## 2019-05-26 DIAGNOSIS — Z6834 Body mass index (BMI) 34.0-34.9, adult: Secondary | ICD-10-CM | POA: Diagnosis not present

## 2019-05-26 DIAGNOSIS — K219 Gastro-esophageal reflux disease without esophagitis: Secondary | ICD-10-CM | POA: Diagnosis present

## 2019-05-26 LAB — SARS CORONAVIRUS 2 (TAT 6-24 HRS): SARS Coronavirus 2: NEGATIVE

## 2019-05-28 MED ORDER — BUPIVACAINE LIPOSOME 1.3 % IJ SUSP
20.0000 mL | Freq: Once | INTRAMUSCULAR | Status: DC
  Filled 2019-05-28: qty 20

## 2019-05-28 NOTE — Anesthesia Preprocedure Evaluation (Addendum)
Anesthesia Evaluation  Patient identified by MRN, date of birth, ID band Patient awake    Reviewed: Allergy & Precautions, NPO status , Patient's Chart, lab work & pertinent test results, reviewed documented beta blocker date and time   History of Anesthesia Complications Negative for: history of anesthetic complications  Airway Mallampati: II  TM Distance: >3 FB Neck ROM: Limited    Dental  (+) Dental Advisory Given   Pulmonary neg pulmonary ROS,  05/26/2019 SARS coronavirus NEG   breath sounds clear to auscultation       Cardiovascular hypertension, Pt. on medications and Pt. on home beta blockers (-) angina Rhythm:Regular Rate:Normal     Neuro/Psych negative neurological ROS     GI/Hepatic Neg liver ROS, GERD  Medicated and Controlled,  Endo/Other  Morbid obesity  Renal/GU negative Renal ROS     Musculoskeletal  (+) Arthritis , Osteoarthritis,    Abdominal (+) + obese,   Peds  Hematology negative hematology ROS (+)   Anesthesia Other Findings   Reproductive/Obstetrics                            Anesthesia Physical Anesthesia Plan  ASA: III  Anesthesia Plan: General   Post-op Pain Management:    Induction: Intravenous  PONV Risk Score and Plan: 4 or greater and Propofol infusion, Dexamethasone, Ondansetron and Treatment may vary due to age or medical condition  Airway Management Planned: Oral ETT  Additional Equipment: None  Intra-op Plan:   Post-operative Plan: Extubation in OR  Informed Consent: I have reviewed the patients History and Physical, chart, labs and discussed the procedure including the risks, benefits and alternatives for the proposed anesthesia with the patient or authorized representative who has indicated his/her understanding and acceptance.     Dental advisory given  Plan Discussed with: CRNA and Surgeon  Anesthesia Plan Comments:         Anesthesia Quick Evaluation

## 2019-05-29 ENCOUNTER — Inpatient Hospital Stay (HOSPITAL_COMMUNITY): Payer: Medicare HMO | Admitting: Anesthesiology

## 2019-05-29 ENCOUNTER — Encounter (HOSPITAL_COMMUNITY): Payer: Self-pay | Admitting: Surgery

## 2019-05-29 ENCOUNTER — Inpatient Hospital Stay (HOSPITAL_COMMUNITY)
Admission: RE | Admit: 2019-05-29 | Discharge: 2019-06-04 | DRG: 330 | Disposition: A | Discharge status: Home Health | Payer: Medicare HMO | Source: Ambulatory Visit | Attending: Surgery | Admitting: Surgery

## 2019-05-29 ENCOUNTER — Other Ambulatory Visit: Payer: Self-pay

## 2019-05-29 ENCOUNTER — Encounter (HOSPITAL_COMMUNITY)
Admission: RE | Disposition: A | Discharge status: Home / Self Care | Payer: Self-pay | Source: Home / Self Care | Attending: Surgery

## 2019-05-29 DIAGNOSIS — Z9049 Acquired absence of other specified parts of digestive tract: Secondary | ICD-10-CM

## 2019-05-29 DIAGNOSIS — G8929 Other chronic pain: Secondary | ICD-10-CM | POA: Diagnosis present

## 2019-05-29 DIAGNOSIS — K219 Gastro-esophageal reflux disease without esophagitis: Secondary | ICD-10-CM | POA: Diagnosis present

## 2019-05-29 DIAGNOSIS — K660 Peritoneal adhesions (postprocedural) (postinfection): Secondary | ICD-10-CM | POA: Diagnosis present

## 2019-05-29 DIAGNOSIS — K5190 Ulcerative colitis, unspecified, without complications: Secondary | ICD-10-CM | POA: Diagnosis present

## 2019-05-29 DIAGNOSIS — K432 Incisional hernia without obstruction or gangrene: Secondary | ICD-10-CM | POA: Diagnosis present

## 2019-05-29 DIAGNOSIS — E785 Hyperlipidemia, unspecified: Secondary | ICD-10-CM | POA: Diagnosis present

## 2019-05-29 DIAGNOSIS — Z96652 Presence of left artificial knee joint: Secondary | ICD-10-CM | POA: Diagnosis present

## 2019-05-29 DIAGNOSIS — Z6834 Body mass index (BMI) 34.0-34.9, adult: Secondary | ICD-10-CM | POA: Diagnosis not present

## 2019-05-29 DIAGNOSIS — Z20822 Contact with and (suspected) exposure to covid-19: Secondary | ICD-10-CM | POA: Diagnosis present

## 2019-05-29 DIAGNOSIS — C187 Malignant neoplasm of sigmoid colon: Principal | ICD-10-CM | POA: Diagnosis present

## 2019-05-29 DIAGNOSIS — I10 Essential (primary) hypertension: Secondary | ICD-10-CM | POA: Diagnosis present

## 2019-05-29 HISTORY — PX: INCISIONAL HERNIA REPAIR: SHX193

## 2019-05-29 HISTORY — DX: Acquired absence of other specified parts of digestive tract: Z90.49

## 2019-05-29 LAB — TYPE AND SCREEN
ABO/RH(D): A POS
Antibody Screen: NEGATIVE

## 2019-05-29 SURGERY — COLECTOMY, PARTIAL, ROBOT-ASSISTED, LAPAROSCOPIC
Anesthesia: General

## 2019-05-29 MED ORDER — ACETAMINOPHEN 500 MG PO TABS
1000.0000 mg | ORAL_TABLET | Freq: Four times a day (QID) | ORAL | Status: DC
  Administered 2019-05-29 – 2019-06-04 (×20): 1000 mg via ORAL
  Filled 2019-05-29 (×22): qty 2

## 2019-05-29 MED ORDER — ALVIMOPAN 12 MG PO CAPS
12.0000 mg | ORAL_CAPSULE | Freq: Two times a day (BID) | ORAL | Status: DC
  Administered 2019-05-30: 12 mg via ORAL
  Filled 2019-05-29 (×2): qty 1

## 2019-05-29 MED ORDER — HYDROMORPHONE HCL 1 MG/ML IJ SOLN
0.2500 mg | INTRAMUSCULAR | Status: DC | PRN
  Administered 2019-05-29: 0.5 mg via INTRAVENOUS
  Administered 2019-05-29 (×2): 0.25 mg via INTRAVENOUS
  Administered 2019-05-29: 0.5 mg via INTRAVENOUS

## 2019-05-29 MED ORDER — FENTANYL CITRATE (PF) 250 MCG/5ML IJ SOLN
INTRAMUSCULAR | Status: AC
  Filled 2019-05-29: qty 5

## 2019-05-29 MED ORDER — DIPHENHYDRAMINE HCL 50 MG/ML IJ SOLN: 12.5000 mg | Freq: Four times a day (QID) | INTRAMUSCULAR | Status: DC | PRN

## 2019-05-29 MED ORDER — LIDOCAINE 2% (20 MG/ML) 5 ML SYRINGE
INTRAMUSCULAR | Status: AC
  Filled 2019-05-29: qty 5

## 2019-05-29 MED ORDER — PROPOFOL 10 MG/ML IV BOLUS
INTRAVENOUS | Status: DC | PRN
  Administered 2019-05-29: 50 mg via INTRAVENOUS
  Administered 2019-05-29: 150 mg via INTRAVENOUS

## 2019-05-29 MED ORDER — DEXAMETHASONE SODIUM PHOSPHATE 10 MG/ML IJ SOLN
INTRAMUSCULAR | Status: DC | PRN
  Administered 2019-05-29: 8 mg via INTRAVENOUS

## 2019-05-29 MED ORDER — PROMETHAZINE HCL 25 MG/ML IJ SOLN: 6.2500 mg | INTRAMUSCULAR | Status: DC | PRN

## 2019-05-29 MED ORDER — LATANOPROST 0.005 % OP SOLN
1.0000 [drp] | Freq: Every day | OPHTHALMIC | Status: DC
  Administered 2019-05-29 – 2019-06-03 (×6): 1 [drp] via OPHTHALMIC
  Filled 2019-05-29: qty 2.5

## 2019-05-29 MED ORDER — ONDANSETRON HCL 4 MG/2ML IJ SOLN
4.0000 mg | Freq: Four times a day (QID) | INTRAMUSCULAR | Status: DC | PRN
  Administered 2019-06-01: 4 mg via INTRAVENOUS
  Filled 2019-05-29: qty 2

## 2019-05-29 MED ORDER — ROCURONIUM BROMIDE 10 MG/ML (PF) SYRINGE
PREFILLED_SYRINGE | INTRAVENOUS | Status: AC
  Filled 2019-05-29: qty 10

## 2019-05-29 MED ORDER — POLYETHYLENE GLYCOL 3350 17 GM/SCOOP PO POWD
1.0000 | Freq: Once | ORAL | Status: DC
  Filled 2019-05-29: qty 255

## 2019-05-29 MED ORDER — LACTATED RINGERS IV SOLN: INTRAVENOUS | Status: DC | PRN

## 2019-05-29 MED ORDER — ACETAMINOPHEN 500 MG PO TABS: 1000.0000 mg | ORAL_TABLET | Freq: Once | ORAL | Status: AC

## 2019-05-29 MED ORDER — MIDAZOLAM HCL 2 MG/2ML IJ SOLN: 0.5000 mg | Freq: Once | INTRAMUSCULAR | Status: DC | PRN

## 2019-05-29 MED ORDER — KETOROLAC TROMETHAMINE 15 MG/ML IJ SOLN
INTRAMUSCULAR | Status: AC
  Filled 2019-05-29: qty 1

## 2019-05-29 MED ORDER — ROCURONIUM BROMIDE 10 MG/ML (PF) SYRINGE
PREFILLED_SYRINGE | INTRAVENOUS | Status: DC | PRN
  Administered 2019-05-29: 60 mg via INTRAVENOUS
  Administered 2019-05-29: 30 mg via INTRAVENOUS
  Administered 2019-05-29 (×2): 20 mg via INTRAVENOUS

## 2019-05-29 MED ORDER — ACETAMINOPHEN 10 MG/ML IV SOLN
INTRAVENOUS | Status: AC
  Filled 2019-05-29: qty 100

## 2019-05-29 MED ORDER — BUPIVACAINE HCL 0.25 % IJ SOLN
INTRAMUSCULAR | Status: AC
  Filled 2019-05-29: qty 1

## 2019-05-29 MED ORDER — PHENYLEPHRINE HCL-NACL 20-0.9 MG/250ML-% IV SOLN
INTRAVENOUS | Status: DC | PRN
  Administered 2019-05-29: 25 ug/min via INTRAVENOUS

## 2019-05-29 MED ORDER — LACTATED RINGERS IV SOLN: INTRAVENOUS | Status: DC

## 2019-05-29 MED ORDER — KETAMINE HCL 10 MG/ML IJ SOLN
INTRAMUSCULAR | Status: AC
  Filled 2019-05-29: qty 1

## 2019-05-29 MED ORDER — CHLORHEXIDINE GLUCONATE CLOTH 2 % EX PADS: 6.0000 | MEDICATED_PAD | Freq: Once | CUTANEOUS | Status: DC

## 2019-05-29 MED ORDER — FENTANYL CITRATE (PF) 100 MCG/2ML IJ SOLN
INTRAMUSCULAR | Status: AC
  Filled 2019-05-29: qty 2

## 2019-05-29 MED ORDER — ACETAMINOPHEN 500 MG PO TABS
1000.0000 mg | ORAL_TABLET | ORAL | Status: AC
  Administered 2019-05-29: 1000 mg via ORAL
  Filled 2019-05-29: qty 2

## 2019-05-29 MED ORDER — LABETALOL HCL 5 MG/ML IV SOLN
INTRAVENOUS | Status: AC
  Filled 2019-05-29: qty 4

## 2019-05-29 MED ORDER — DIPHENHYDRAMINE HCL 12.5 MG/5ML PO ELIX: 12.5000 mg | ORAL_SOLUTION | Freq: Four times a day (QID) | ORAL | Status: DC | PRN

## 2019-05-29 MED ORDER — LACTATED RINGERS IR SOLN
Status: DC | PRN
  Administered 2019-05-29: 1

## 2019-05-29 MED ORDER — HYDROMORPHONE HCL 1 MG/ML IJ SOLN
INTRAMUSCULAR | Status: AC
  Filled 2019-05-29: qty 1

## 2019-05-29 MED ORDER — NEOMYCIN SULFATE 500 MG PO TABS
1000.0000 mg | ORAL_TABLET | ORAL | Status: DC
  Filled 2019-05-29: qty 2

## 2019-05-29 MED ORDER — ALVIMOPAN 12 MG PO CAPS
12.0000 mg | ORAL_CAPSULE | ORAL | Status: AC
  Administered 2019-05-29: 12 mg via ORAL
  Filled 2019-05-29: qty 1

## 2019-05-29 MED ORDER — BENAZEPRIL HCL 10 MG PO TABS
40.0000 mg | ORAL_TABLET | Freq: Every day | ORAL | Status: DC
  Administered 2019-05-30 – 2019-06-03 (×5): 40 mg via ORAL
  Filled 2019-05-29 (×5): qty 4

## 2019-05-29 MED ORDER — BUPIVACAINE HCL (PF) 0.25 % IJ SOLN
INTRAMUSCULAR | Status: DC | PRN
  Administered 2019-05-29: 30 mL

## 2019-05-29 MED ORDER — TRAMADOL HCL 50 MG PO TABS
50.0000 mg | ORAL_TABLET | Freq: Four times a day (QID) | ORAL | Status: DC | PRN
  Administered 2019-05-30 – 2019-06-04 (×10): 50 mg via ORAL
  Filled 2019-05-29 (×11): qty 1

## 2019-05-29 MED ORDER — EPHEDRINE 5 MG/ML INJ
INTRAVENOUS | Status: AC
  Filled 2019-05-29: qty 10

## 2019-05-29 MED ORDER — PROPOFOL 10 MG/ML IV BOLUS
INTRAVENOUS | Status: AC
  Filled 2019-05-29: qty 20

## 2019-05-29 MED ORDER — LIDOCAINE 2% (20 MG/ML) 5 ML SYRINGE
INTRAMUSCULAR | Status: DC | PRN
  Administered 2019-05-29: 60 mg via INTRAVENOUS
  Administered 2019-05-29: 1.5 mg/kg/h via INTRAVENOUS

## 2019-05-29 MED ORDER — KETAMINE HCL 10 MG/ML IJ SOLN
INTRAMUSCULAR | Status: DC | PRN
  Administered 2019-05-29: 25 mg via INTRAVENOUS

## 2019-05-29 MED ORDER — PHENYLEPHRINE HCL (PRESSORS) 10 MG/ML IV SOLN
INTRAVENOUS | Status: AC
  Filled 2019-05-29: qty 2

## 2019-05-29 MED ORDER — EPHEDRINE SULFATE-NACL 50-0.9 MG/10ML-% IV SOSY
PREFILLED_SYRINGE | INTRAVENOUS | Status: DC | PRN
  Administered 2019-05-29: 10 mg via INTRAVENOUS

## 2019-05-29 MED ORDER — LABETALOL HCL 5 MG/ML IV SOLN
INTRAVENOUS | Status: DC | PRN
  Administered 2019-05-29: 2.5 mg via INTRAVENOUS

## 2019-05-29 MED ORDER — ONDANSETRON HCL 4 MG/2ML IJ SOLN
INTRAMUSCULAR | Status: AC
  Filled 2019-05-29: qty 2

## 2019-05-29 MED ORDER — FENTANYL CITRATE (PF) 100 MCG/2ML IJ SOLN
INTRAMUSCULAR | Status: DC | PRN
  Administered 2019-05-29: 200 ug via INTRAVENOUS
  Administered 2019-05-29 (×4): 50 ug via INTRAVENOUS

## 2019-05-29 MED ORDER — AMLODIPINE BESYLATE 10 MG PO TABS
10.0000 mg | ORAL_TABLET | Freq: Every day | ORAL | Status: DC
  Administered 2019-05-30 – 2019-06-04 (×6): 10 mg via ORAL
  Filled 2019-05-29 (×7): qty 1

## 2019-05-29 MED ORDER — METRONIDAZOLE 500 MG PO TABS
1000.0000 mg | ORAL_TABLET | ORAL | Status: DC
  Filled 2019-05-29: qty 2

## 2019-05-29 MED ORDER — ENSURE SURGERY PO LIQD
237.0000 mL | Freq: Two times a day (BID) | ORAL | Status: DC
  Administered 2019-05-30 – 2019-06-03 (×9): 237 mL via ORAL
  Filled 2019-05-29 (×13): qty 237

## 2019-05-29 MED ORDER — HEPARIN SODIUM (PORCINE) 5000 UNIT/ML IJ SOLN
5000.0000 [IU] | Freq: Three times a day (TID) | INTRAMUSCULAR | Status: DC
  Administered 2019-05-29 – 2019-06-04 (×17): 5000 [IU] via SUBCUTANEOUS
  Filled 2019-05-29 (×17): qty 1

## 2019-05-29 MED ORDER — ONDANSETRON HCL 4 MG PO TABS: 4.0000 mg | ORAL_TABLET | Freq: Four times a day (QID) | ORAL | Status: DC | PRN

## 2019-05-29 MED ORDER — HYDROMORPHONE HCL 1 MG/ML IJ SOLN
0.5000 mg | INTRAMUSCULAR | Status: DC | PRN
  Administered 2019-05-29: 0.5 mg via INTRAVENOUS

## 2019-05-29 MED ORDER — BISACODYL 5 MG PO TBEC: 20.0000 mg | DELAYED_RELEASE_TABLET | Freq: Once | ORAL | Status: DC

## 2019-05-29 MED ORDER — PANTOPRAZOLE SODIUM 40 MG PO TBEC
40.0000 mg | DELAYED_RELEASE_TABLET | Freq: Two times a day (BID) | ORAL | Status: DC
  Administered 2019-05-29 – 2019-06-04 (×12): 40 mg via ORAL
  Filled 2019-05-29 (×13): qty 1

## 2019-05-29 MED ORDER — HEPARIN SODIUM (PORCINE) 5000 UNIT/ML IJ SOLN
5000.0000 [IU] | Freq: Once | INTRAMUSCULAR | Status: AC
  Administered 2019-05-29: 5000 [IU] via SUBCUTANEOUS
  Filled 2019-05-29: qty 1

## 2019-05-29 MED ORDER — MEPERIDINE HCL 50 MG/ML IJ SOLN: 6.2500 mg | INTRAMUSCULAR | Status: DC | PRN

## 2019-05-29 MED ORDER — HYDROMORPHONE HCL 1 MG/ML IJ SOLN
INTRAMUSCULAR | Status: AC
  Administered 2019-05-29: 0.5 mg via INTRAVENOUS
  Filled 2019-05-29: qty 1

## 2019-05-29 MED ORDER — IBUPROFEN 400 MG PO TABS
600.0000 mg | ORAL_TABLET | Freq: Four times a day (QID) | ORAL | Status: DC | PRN
  Administered 2019-05-29 – 2019-06-01 (×7): 600 mg via ORAL
  Filled 2019-05-29 (×8): qty 1

## 2019-05-29 MED ORDER — SUGAMMADEX SODIUM 200 MG/2ML IV SOLN
INTRAVENOUS | Status: DC | PRN
  Administered 2019-05-29: 180 mg via INTRAVENOUS

## 2019-05-29 MED ORDER — CARVEDILOL 3.125 MG PO TABS
3.1250 mg | ORAL_TABLET | Freq: Every day | ORAL | Status: DC
  Administered 2019-05-29 – 2019-05-30 (×2): 3.125 mg via ORAL
  Filled 2019-05-29 (×2): qty 1

## 2019-05-29 MED ORDER — ACETAMINOPHEN 10 MG/ML IV SOLN
1000.0000 mg | Freq: Once | INTRAVENOUS | Status: AC
  Administered 2019-05-29: 1000 mg via INTRAVENOUS

## 2019-05-29 MED ORDER — KETOROLAC TROMETHAMINE 15 MG/ML IJ SOLN
15.0000 mg | Freq: Once | INTRAMUSCULAR | Status: AC
  Administered 2019-05-29: 15 mg via INTRAVENOUS

## 2019-05-29 MED ORDER — LIDOCAINE HCL 2 % IJ SOLN
INTRAMUSCULAR | Status: AC
  Filled 2019-05-29: qty 20

## 2019-05-29 MED ORDER — DEXAMETHASONE SODIUM PHOSPHATE 10 MG/ML IJ SOLN
INTRAMUSCULAR | Status: AC
  Filled 2019-05-29: qty 1

## 2019-05-29 MED ORDER — BUPIVACAINE LIPOSOME 1.3 % IJ SUSP
INTRAMUSCULAR | Status: DC | PRN
  Administered 2019-05-29: 20 mL

## 2019-05-29 MED ORDER — MIDAZOLAM HCL 5 MG/5ML IJ SOLN
INTRAMUSCULAR | Status: DC | PRN
  Administered 2019-05-29: 2 mg via INTRAVENOUS

## 2019-05-29 MED ORDER — MIDAZOLAM HCL 2 MG/2ML IJ SOLN
INTRAMUSCULAR | Status: AC
  Filled 2019-05-29: qty 2

## 2019-05-29 MED ORDER — ONDANSETRON HCL 4 MG/2ML IJ SOLN
INTRAMUSCULAR | Status: DC | PRN
  Administered 2019-05-29: 4 mg via INTRAVENOUS

## 2019-05-29 MED ORDER — PHENYLEPHRINE 40 MCG/ML (10ML) SYRINGE FOR IV PUSH (FOR BLOOD PRESSURE SUPPORT)
PREFILLED_SYRINGE | INTRAVENOUS | Status: DC | PRN
  Administered 2019-05-29: 80 ug via INTRAVENOUS
  Administered 2019-05-29: 120 ug via INTRAVENOUS
  Administered 2019-05-29 (×3): 80 ug via INTRAVENOUS

## 2019-05-29 MED ORDER — 0.9 % SODIUM CHLORIDE (POUR BTL) OPTIME
TOPICAL | Status: DC | PRN
  Administered 2019-05-29: 2000 mL

## 2019-05-29 MED ORDER — ALUM & MAG HYDROXIDE-SIMETH 200-200-20 MG/5ML PO SUSP
30.0000 mL | Freq: Four times a day (QID) | ORAL | Status: DC | PRN
  Administered 2019-06-01: 30 mL via ORAL
  Filled 2019-05-29: qty 30

## 2019-05-29 MED ORDER — PHENYLEPHRINE 40 MCG/ML (10ML) SYRINGE FOR IV PUSH (FOR BLOOD PRESSURE SUPPORT)
PREFILLED_SYRINGE | INTRAVENOUS | Status: AC
  Filled 2019-05-29: qty 10

## 2019-05-29 MED ORDER — SODIUM CHLORIDE 0.9 % IV SOLN
2.0000 g | INTRAVENOUS | Status: AC
  Administered 2019-05-29: 2 g via INTRAVENOUS
  Filled 2019-05-29: qty 2

## 2019-05-29 SURGICAL SUPPLY — 119 items
ADH SKN CLS APL DERMABOND .7 (GAUZE/BANDAGES/DRESSINGS) ×1
APL PRP STRL LF DISP 70% ISPRP (MISCELLANEOUS) ×1
APPLIER CLIP 5 13 M/L LIGAMAX5 (MISCELLANEOUS)
APPLIER CLIP ROT 10 11.4 M/L (STAPLE)
APR CLP MED LRG 11.4X10 (STAPLE)
APR CLP MED LRG 5 ANG JAW (MISCELLANEOUS)
BLADE EXTENDED COATED 6.5IN (ELECTRODE) ×2 IMPLANT
CANNULA REDUC XI 12-8 STAPL (CANNULA) ×2
CANNULA REDUCER 12-8 DVNC XI (CANNULA) ×1 IMPLANT
CELLS DAT CNTRL 66122 CELL SVR (MISCELLANEOUS) IMPLANT
CHLORAPREP W/TINT 26 (MISCELLANEOUS) ×2 IMPLANT
CLIP APPLIE 5 13 M/L LIGAMAX5 (MISCELLANEOUS) IMPLANT
CLIP APPLIE ROT 10 11.4 M/L (STAPLE) IMPLANT
CLIP VESOLOCK LG 6/CT PURPLE (CLIP) IMPLANT
CLIP VESOLOCK MED LG 6/CT (CLIP) IMPLANT
COVER SURGICAL LIGHT HANDLE (MISCELLANEOUS) ×4 IMPLANT
COVER TIP SHEARS 8 DVNC (MISCELLANEOUS) ×1 IMPLANT
COVER TIP SHEARS 8MM DA VINCI (MISCELLANEOUS) ×2
COVER WAND RF STERILE (DRAPES) IMPLANT
DECANTER SPIKE VIAL GLASS SM (MISCELLANEOUS) ×2 IMPLANT
DERMABOND ADVANCED (GAUZE/BANDAGES/DRESSINGS) ×1
DERMABOND ADVANCED .7 DNX12 (GAUZE/BANDAGES/DRESSINGS) IMPLANT
DEVICE TROCAR PUNCTURE CLOSURE (ENDOMECHANICALS) IMPLANT
DRAIN CHANNEL 19F RND (DRAIN) ×2 IMPLANT
DRAPE ARM DVNC X/XI (DISPOSABLE) ×4 IMPLANT
DRAPE COLUMN DVNC XI (DISPOSABLE) ×1 IMPLANT
DRAPE DA VINCI XI ARM (DISPOSABLE) ×8
DRAPE DA VINCI XI COLUMN (DISPOSABLE) ×2
DRAPE SURG IRRIG POUCH 19X23 (DRAPES) ×2 IMPLANT
DRSG OPSITE POSTOP 4X10 (GAUZE/BANDAGES/DRESSINGS) IMPLANT
DRSG OPSITE POSTOP 4X6 (GAUZE/BANDAGES/DRESSINGS) ×1 IMPLANT
DRSG OPSITE POSTOP 4X8 (GAUZE/BANDAGES/DRESSINGS) IMPLANT
DRSG TEGADERM 2-3/8X2-3/4 SM (GAUZE/BANDAGES/DRESSINGS) ×10 IMPLANT
DRSG TEGADERM 4X4.75 (GAUZE/BANDAGES/DRESSINGS) ×2 IMPLANT
ELECT REM PT RETURN 15FT ADLT (MISCELLANEOUS) ×2 IMPLANT
ENDOLOOP SUT PDS II  0 18 (SUTURE)
ENDOLOOP SUT PDS II 0 18 (SUTURE) IMPLANT
EVACUATOR SILICONE 100CC (DRAIN) ×2 IMPLANT
GAUZE SPONGE 2X2 8PLY STRL LF (GAUZE/BANDAGES/DRESSINGS) ×1 IMPLANT
GAUZE SPONGE 4X4 12PLY STRL (GAUZE/BANDAGES/DRESSINGS) IMPLANT
GLOVE BIO SURGEON STRL SZ7.5 (GLOVE) ×6 IMPLANT
GLOVE INDICATOR 8.0 STRL GRN (GLOVE) ×6 IMPLANT
GOWN STRL REUS W/TWL XL LVL3 (GOWN DISPOSABLE) ×10 IMPLANT
GRASPER SUT TROCAR 14GX15 (MISCELLANEOUS) IMPLANT
HOLDER FOLEY CATH W/STRAP (MISCELLANEOUS) ×2 IMPLANT
KIT PROCEDURE DA VINCI SI (MISCELLANEOUS)
KIT PROCEDURE DVNC SI (MISCELLANEOUS) IMPLANT
KIT TURNOVER KIT A (KITS) IMPLANT
NDL INSUFFLATION 14GA 120MM (NEEDLE) ×1 IMPLANT
NEEDLE INSUFFLATION 14GA 120MM (NEEDLE) ×2 IMPLANT
PACK CARDIOVASCULAR III (CUSTOM PROCEDURE TRAY) ×2 IMPLANT
PAD POSITIONING PINK XL (MISCELLANEOUS) ×2 IMPLANT
PENCIL SMOKE EVACUATOR (MISCELLANEOUS) IMPLANT
PORT LAP GEL ALEXIS MED 5-9CM (MISCELLANEOUS) ×2 IMPLANT
PROTECTOR NERVE ULNAR (MISCELLANEOUS) ×4 IMPLANT
RELOAD PROXIMATE 75MM BLUE (ENDOMECHANICALS) ×2 IMPLANT
RELOAD STAPLE 45 4.3 GRN DVNC (STAPLE) IMPLANT
RELOAD STAPLE 45 BLU REG DVNC (STAPLE) IMPLANT
RELOAD STAPLE 45 GRN THCK DVNC (STAPLE) IMPLANT
RELOAD STAPLE 75 3.8 BLU REG (ENDOMECHANICALS) IMPLANT
RELOAD STAPLER 4.3X45 GRN DVNC (STAPLE) ×3 IMPLANT
RETRACTOR WND ALEXIS 18 MED (MISCELLANEOUS) IMPLANT
RTRCTR WOUND ALEXIS 18CM MED (MISCELLANEOUS)
SCISSORS LAP 5X35 DISP (ENDOMECHANICALS) ×1 IMPLANT
SEAL CANN UNIV 5-8 DVNC XI (MISCELLANEOUS) ×4 IMPLANT
SEAL XI 5MM-8MM UNIVERSAL (MISCELLANEOUS) ×8
SEALER TISSUE G2 STRG ARTC 35C (ENDOMECHANICALS) ×1 IMPLANT
SEALER VESSEL DA VINCI XI (MISCELLANEOUS) ×2
SEALER VESSEL EXT DVNC XI (MISCELLANEOUS) ×1 IMPLANT
SET IRRIG TUBING LAPAROSCOPIC (IRRIGATION / IRRIGATOR) ×2 IMPLANT
SLEEVE ADV FIXATION 5X100MM (TROCAR) ×1 IMPLANT
SOLUTION ELECTROLUBE (MISCELLANEOUS) ×2 IMPLANT
SPONGE GAUZE 2X2 STER 10/PKG (GAUZE/BANDAGES/DRESSINGS) ×1
STAPLER 45 BLU RELOAD XI (STAPLE) IMPLANT
STAPLER 45 BLUE RELOAD XI (STAPLE)
STAPLER 45 DA VINCI SURE FORM (STAPLE) ×2
STAPLER 45 GREEN RELOAD XI (STAPLE)
STAPLER 45 GRN RELOAD XI (STAPLE) IMPLANT
STAPLER 45 SUREFORM DVNC (STAPLE) IMPLANT
STAPLER CANNULA SEAL DVNC XI (STAPLE) ×1 IMPLANT
STAPLER CANNULA SEAL XI (STAPLE) ×2
STAPLER PROXIMATE 75MM BLUE (STAPLE) ×1 IMPLANT
STAPLER RELOAD 4.3X45 GREEN (STAPLE) ×6
STAPLER RELOAD 4.3X45 GRN DVNC (STAPLE) ×3
STAPLER SHEATH (SHEATH) ×2
STAPLER SHEATH ENDOWRIST DVNC (SHEATH) ×1 IMPLANT
STOPCOCK 4 WAY LG BORE MALE ST (IV SETS) ×4 IMPLANT
SURGILUBE 2OZ TUBE FLIPTOP (MISCELLANEOUS) ×2 IMPLANT
SUT ETHILON 2 0 PS N (SUTURE) ×1 IMPLANT
SUT MNCRL AB 4-0 PS2 18 (SUTURE) ×3 IMPLANT
SUT NOVA NAB DX-16 0-1 5-0 T12 (SUTURE) ×2 IMPLANT
SUT PDS AB 1 CT1 27 (SUTURE) ×2 IMPLANT
SUT PDS AB 1 TP1 96 (SUTURE) IMPLANT
SUT PROLENE 0 CT 2 (SUTURE) IMPLANT
SUT PROLENE 2 0 KS (SUTURE) ×2 IMPLANT
SUT PROLENE 2 0 SH DA (SUTURE) IMPLANT
SUT SILK 2 0 (SUTURE)
SUT SILK 2 0 SH CR/8 (SUTURE) IMPLANT
SUT SILK 2-0 18XBRD TIE 12 (SUTURE) IMPLANT
SUT SILK 3 0 (SUTURE) ×2
SUT SILK 3 0 SH CR/8 (SUTURE) ×2 IMPLANT
SUT SILK 3-0 18XBRD TIE 12 (SUTURE) ×1 IMPLANT
SUT V-LOC BARB 180 2/0GR6 GS22 (SUTURE)
SUT VIC AB 3-0 SH 18 (SUTURE) ×1 IMPLANT
SUT VIC AB 3-0 SH 27 (SUTURE)
SUT VIC AB 3-0 SH 27XBRD (SUTURE) IMPLANT
SUT VIC AB 3-0 SH 8-18 (SUTURE) ×1 IMPLANT
SUT VICRYL 0 UR6 27IN ABS (SUTURE) ×2 IMPLANT
SUTURE V-LC BRB 180 2/0GR6GS22 (SUTURE) IMPLANT
SYR 10ML LL (SYRINGE) ×2 IMPLANT
SYS LAPSCP GELPORT 120MM (MISCELLANEOUS)
SYSTEM LAPSCP GELPORT 120MM (MISCELLANEOUS) IMPLANT
TAPE UMBILICAL COTTON 1/8X30 (MISCELLANEOUS) ×2 IMPLANT
TOWEL OR NON WOVEN STRL DISP B (DISPOSABLE) ×2 IMPLANT
TRAY COLON PACK (CUSTOM PROCEDURE TRAY) ×2 IMPLANT
TRAY FOLEY MTR SLVR 16FR STAT (SET/KITS/TRAYS/PACK) ×2 IMPLANT
TROCAR ADV FIXATION 5X100MM (TROCAR) ×2 IMPLANT
TUBING CONNECTING 10 (TUBING) ×4 IMPLANT
TUBING INSUFFLATION 10FT LAP (TUBING) ×2 IMPLANT

## 2019-05-29 NOTE — Anesthesia Postprocedure Evaluation (Signed)
Anesthesia Post Note  Patient: Donna [name]  Procedure(s) Performed: ROBOTIC TOTAL PROCTOCOLECTOMY, END ILEOSTOMY (N/A ) PRIMARY INCISIONAL HERNIA REPAIR (N/A )     Patient location during evaluation: PACU Anesthesia Type: General Level of consciousness: awake and alert Pain management: pain level controlled Vital Signs Assessment: post-procedure vital signs reviewed and stable Respiratory status: spontaneous breathing, nonlabored ventilation, respiratory function stable and patient connected to nasal cannula oxygen Cardiovascular status: blood pressure returned to baseline and stable Postop Assessment: no apparent nausea or vomiting Anesthetic complications: no    Last Vitals:  Vitals:   05/29/19 1430 05/29/19 1445  BP: (!) 156/70 (!) 160/68  Pulse: 76 80  Resp: 13 11  Temp:  36.4 C  SpO2: 97% 94%    Last Pain:  Vitals:   05/29/19 1445  TempSrc:   PainSc: 6                  Lidia Collum

## 2019-05-29 NOTE — Transfer of Care (Signed)
Immediate Anesthesia Transfer of Care Note  Patient: Donna [name]  Procedure(s) Performed: ROBOTIC TOTAL PROCTOCOLECTOMY, END ILEOSTOMY (N/A ) PRIMARY INCISIONAL HERNIA REPAIR (N/A )  Patient Location: PACU  Anesthesia Type:General  Level of Consciousness: awake, alert , oriented and patient cooperative  Airway & Oxygen Therapy: Patient Spontanous Breathing and Patient connected to face mask oxygen  Post-op Assessment: Report given to RN, Post -op Vital signs reviewed and stable and Patient moving all extremities  Post vital signs: Reviewed and stable  Last Vitals:  Vitals Value Taken Time  BP 150/73 05/29/19 1338  Temp    Pulse 79 05/29/19 1340  Resp 12 05/29/19 1340  SpO2 99 % 05/29/19 1340  Vitals shown include unvalidated device data.  Last Pain:  Vitals:   05/29/19 0612  TempSrc:   PainSc: 7       Patients Stated Pain Goal: 4 (97/35/32 9924)  Complications: No apparent anesthesia complications

## 2019-05-29 NOTE — Anesthesia Procedure Notes (Addendum)
Procedure Name: Intubation Date/Time: 05/29/2019 7:34 AM Performed by: Victoriano Lain, CRNA Pre-anesthesia Checklist: Patient identified, Emergency Drugs available, Suction available, Patient being monitored and Timeout performed Patient Re-evaluated:Patient Re-evaluated prior to induction Oxygen Delivery Method: Circle system utilized Preoxygenation: Pre-oxygenation with 100% oxygen Induction Type: IV induction Ventilation: Mask ventilation without difficulty Laryngoscope Size: Glidescope and 3 Grade View: Grade I Tube type: Oral Tube size: 7.5 mm Number of attempts: 1 Airway Equipment and Method: Stylet and Video-laryngoscopy Placement Confirmation: ETT inserted through vocal cords under direct vision,  positive ETCO2 and breath sounds checked- equal and bilateral Secured at: 21 cm Tube secured with: Tape Dental Injury: Teeth and Oropharynx as per pre-operative assessment  Difficulty Due To: Difficulty was unanticipated, Difficult Airway- due to large tongue and Difficult Airway- due to reduced neck mobility Comments: Smooth IV induction. Easy mask. DL X 1 by CRNA with MAC 4. Grade 2 view. Unable to pass ETT through vocal cords. DL X 1 by Dr Glennon Mac with MAC 4. Grade 1 view obtained briefly by Dr Glennon Mac. Attempted to pass Boogie. Pt masked. VSS. Head repositioned. DL X 1 by Dr Glennon Mac with Glidescope #3. Grade 1 view. +ETCO2. BBS=. ATOI. ETT secured at 21 cm at the lip.

## 2019-05-29 NOTE — Op Note (Signed)
05/29/2019  1:46 PM  PATIENT:  Donna [name]  74 y.o. female  Patient Care Team: Donna Pretty, MD as PCP - General (Internal Medicine)  PRE-OPERATIVE DIAGNOSIS:  Ulcerative colitis; high grade dysplasia in colon  POST-OPERATIVE DIAGNOSIS:  Same  PROCEDURE: 1. Robotic total proctocolectomy with end ileostomy 2. Lysis of adhesions x 30 minutes 3. Primary repair of incisional hernia  SURGEON:  Donna Mt. Mairlyn Tegtmeyer, MD  ASSISTANT: Donna Ruff, MD  ANESTHESIA:   general  COUNTS:  Sponge, needle and instrument counts were reported correct x2 at the conclusion of the operation.  EBL: 100 mL  DRAINS: 47 Fr round blake drain left draining pelvis  SPECIMEN: Distal ileum, colon (including ileocolic anastomosis), rectum - all en bloc  COMPLICATIONS: None  FINDINGS: Tattoo in sigmoid. Somewhat featureless transverse colon consistent with long-standing ulcerative colitis. No creeping fat. Normal appearing small bowel. Total procotocolectomy carried out with end ileostomy. Swiss cheese defect of midline with essentially 2 hernias next to one another - total defect measuring 8 cm cranial/caudal x 3 cm wide. This was repaired primarily to prevent any sort of immediate postop bowel obstruction. No mesh was used given contaminated nature of the procedure.   DISPOSITION: PACU in satisfactory condition  INDICATION:  Donna [name] is a very pleasant 20yoF with hx of HTN, HLD, GERD, osteoarthritis, in long-standing history of ulcerative colitis-53 year history dating back to when she was 74 years old. She denies ever having had a take steroids for this. She has been well controlled from the disease standpoint with sulfasalazine. She denies any history of infusional agents. She has undergone routine surveillance colonoscopies near annually for the last 30 years. She follows with Donna [name] currently. She does have chronic back pain. She denies any blood in her stool. She underwent another one  of her routine surveillance colonoscopies 12/21/18 which demonstrated diffuse area of mildly erythematous mucosa in the sigmoid and descending and transverse. 6 mm sigmoid polyp found that was flat and removed with the cold snare. Area around the polyp was also biopsied separately. Area was tattooed with spot proximally and distally. 2 flat polyps in the rectum there were 4 mm that were removed. Anal stricture was also found. Pathology returned:  Inactive chronic colitis negative for dysplasia in the proximal colon-no granuloma or inclusions. 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 2. Mildly active chronic colitis negative for dysplasia in the mid colon 3. Low and high-grade dysplasia with focal area suspicious for invasion and the polyp that was removed from the sigmoid. DALM not excluded. 4. Tissue from around the polypectomy site returned with tubulovillous adenoma and low-grade dysplasia 5. Rectosigmoid biopsy returned mildly active chronic colitis negative for dysplasia.  Her pathology is also Glean Salvo out to [facility] for a second pathologist review the findings. By report from Donna [name]'s office, concordance with our pathologist was maintained and this lesion is felt to represent a flat area of cancer involving the polyp with surrounding dysplasia that is indistinct. This is not amenable to further polypectomy as per Donna [name]. She has been referred for consideration of proctocolectomy.  We met and discussed everything at length. She has a reducible midline incisional hernia. We discussed options moving forward and she opted to proceed with surgery. Please refer to notes elsewhere for details regarding this discussion.  DESCRIPTION: The patient was identified in preop holding and taken to the OR where she was placed on the operating room table. SCDs were placed. General endotracheal anesthesia was induced without  difficulty. She was positioned in  lithotomy with Allen stirrups. Arms were tucked. Pressure points were then evaluated and padded. A foley catheter was placed under sterile conditions. She was then prepped and draped in the usual sterile fashion. A surgical timeout was performed indicating the correct patient, procedure, positioning and need for preoperative antibiotics.   An OG tube was placed by anesthesia and confirmed to be to suction.  Given her prior surgical history, decision was made to proceed with a Veress needle entry technique.  At Palmer's point, a stab incision was created and the Veress needle introduced into the peritoneal cavity and the first attempt.  Pneumoperitoneum was established with CO2 to a maximum pressure of 15 mmHg.  Following this, an optical view trocar was carefully placed at Palmer's point.  The laparoscope was inserted and demonstrated no evidence of Veress needle or trocar site complications.  There were adhesions noted to the midline as well as 2 hernias in the midline which were incisional and separated by a very narrow band of bridging tissue.  There was no bowel within the hernia at this time and had all reduced.  There was omentum adherent to the abdominal wall.  2 additional 5 mm trochars were placed one in the left lower quadrant and one in the right lower quadrant.  Bilateral transversus abdominis plane blocks were created with a dilute mixture of Exparel and Marcaine.  Careful adhesiolysis was then carried out using a sharp technique to take the omentum off of the abdominal wall.  The omentum was inspected and noted to be hemostatic.  3 8 mm trochars were placed in a line extending from the left subcostal region to the right ASIS.  An additional 12 mm trocar was placed at the planned ileostomy location.  She was positioned in Trendelenburg with some left side up.  The small bowel was reflected out of the pelvis.  Omentum was reflected over the liver.  The robot was then docked.  I went to the  console.  Sigmoid attachments to the intersigmoid fossa were taken down. Beginning with the pelvic dissection, the rectosigmoid colon was grasped and elevated cephalad.  The right ureter was visualized over the pelvic brim and protected.  Over the sacral promontory, the peritoneum was carefully incised.  The avascular space between the fascia propria of the rectum and the presacral fascia was gained.  This plane was developed posteriorly down into the pelvis.  Care was taken to preserve the hypogastric nerves free of injury.  They were identified and protected.  Working cephalad, the peritoneal incision was extended.  The IMA pedicle was identified.  This was then circumferentially dissected carefully and the left ureter identified.  This was swept "down" and away from the plane of dissection.  The IMA had been circumferentially dissected and the left ureter was again inspected.  The IMA was then ligated approximately 2 cm from its takeoff from the aorta.  This was done with the vessel sealer.  The IMA pedicle was inspected and noted to be hemostatic.  A lateral to medial approach was then employed and the peritoneal attachments of the sigmoid colon carefully taken down.  The Craig Wisnewski line of Toldt was incised all the way up to the splenic flexure and the descending colon and mesocolon mobilized medially.  Attention was turned back to the pelvic dissection.  The rectum was mobilized posteriorly first within the plane noted above and this was then continued laterally and completed anteriorly.  The peritoneal reflection was  incised on the anterior portion of the dissection and the uterus retracted cephalad.  The vagina was protected.  The mesorectal dissection continued all the way down to the pelvic floor.  After confirming that the dissection had been completed circumferentially down to the level of the sphincter muscles, I performed a digital rectal exam confirming the distal extent of our dissection.  A 40 mm green  load robotic stapler was used to divide the distal rectum.  This was done in approximately 3 firings of the stapler.  The staple line was inspected and noted to be both hemostatic and intact.  The rectum was able to be delivered out of the pelvis.  Attention was then turned to the splenic flexure.  She was repositioned in reverse Trendelenburg with left side up.  The splenic flexure was mobilized.  Omentum was taken off of the transverse colon using the vessel sealer.  The lesser sac had been gained.  The splenic flexure mobilization was completed from this approach.  The descending mesocolon was then ligated using the vessel sealer, staying in a plane close to the colon.  The splenic flexure mesocolon was also ligated in this manner.  The middle colic vessels were approached.  Time was taken to inspect the surrounding structures including location of the stomach, pancreas, ligament of Treitz.  Ensuring that the small bowel was well away from our field, the mesocolon was divided and we stayed in a plane close to the colon so as not to injure any critical structures.  This was carried as proximally as could be done from this current set up.  This terminated at the level of the mid transverse colon.  Her prior ileocolic anastomosis appeared to be in the vicinity of the hepatic flexure.  The hepatic flexure was quite mobile and reached to the midline.  Attention was turned to the extracorporeal portion of the procedure.  The robot was undocked.  Over her incisional hernia, the skin and subcutaneous tissue was incised sharply.  The peritoneal sac was opened.  An Thomas wound protector was placed.  The rectum and colon was eviscerated through this wound protector.  The remaining portion of the mesocolon ligation was performed using an Enseal device.  The ileocolic anastomosis was identified.  There was a lot of scar tissue at this location so a small colotomy was created.  Digital exam confirmed the ileocolic  anastomosis location.  The colotomy was then closed and I changed to clean gloves.  A towel was wrapped around the colon with a colotomy then made.  The anastomosis was to a location of the colon that was freely reaching midline.  Duodenum was far away from our field of dissection.  The distal ileum just proximal anastomosis was divided using a GIA blue load stapler.  A Babcock clamp was placed on the ileum to maintain orientation.  The intervening mesentery was then ligated with the Enseal device.  The mesentery was inspected.  There was one oozing vessel that was controlled with a 2-0 silk ligature.  The mesentery is then reevaluated and noted to be completely hemostatic.  The specimen had been passed off.  Of note, there was a tattoo in the sigmoid colon.  Maintaining orientation of the ileum, a 12 mm trocar was removed and the skin incision widened.  Subcutaneous tissue excised.  The fascial defect was widened to approximately 1/2 fingerbreadths.  The ileum was then passed through this.  Orientation was again confirmed such that there was no twisting and  that the mesentery lie flat.  The wound protector cap was placed and pneumoperitoneum reestablished.  The abdomen is inspected and hemostasis appreciated.  The pelvis was irrigated and noted to be hemostatic.  The small bowel was all evaluated and there were no evident small bowel strictures or inflammatory changes/creeping fat.  A 19 French round Blake drain was then placed in the pelvis.  The ileum was inspected and there is no twisting.  Pneumoperitoneum was released and the wound protector removed.  The fascial edges of the prior incisional hernia were identified and cleared circumferentially.  They were able to be brought together in the midline without any significant tension.  Attention was then turned to closing the fascia.  Clean instruments/gowns/gloves were used and a fresh closing tray was opened.  Additional sterile drapes were placed from the  field.  Sponge, needle, and instrument counts were reported correct.  The midline fascia was closed with 2 running #1 PDS sutures along with interrupted #1 Novafil stitches as internal retention sutures.  The subcutaneous tissues were irrigated.  Hemostasis was verified.  Sponge, needle, and instrument counts were reported correct again.  The skin of all incisions was closed with 4-0 Monocryl subcuticular sutures.  Dermabond was placed over the wounds.  Attention was turned to maturing the ileostomy.  The staple line was trimmed.  The ileostomy was then Brooked and everted nicely using 3-0 Vicryl sutures.  The lumen was carefully palpated and noted to be widely patent below the level of the fascia without any apparent twisting/stricturing.  An ileostomy appliance was then cut to fit.  She was then taken out of lithotomy position, awakened from anesthesia, extubated, and transferred to a stretcher for transport to PACU in satisfactory condition

## 2019-05-29 NOTE — H&P (Signed)
CC: Referred by Dr. Cristina Gong for ulcerative colitis with newly diagnosed DALM/malignant polyp in sigmoid colon  HPI: Donna [name] is a very pleasant 74yoF with hx of HTN, HLD, GERD, osteoarthritis, in long-standing history of ulcerative colitis-72 year history dating back to when she was 74 years old. She denies ever having had a take steroids for this. She has been well controlled from the disease standpoint with sulfasalazine. She denies any history of infusional agents. She has undergone routine surveillance colonoscopies near annually for the last 30 years. She follows with Dr. Cristina Gong currently. She does have chronic back pain. She denies any blood in her stool. She underwent another one of her routine surveillance colonoscopies 12/21/18 which demonstrated diffuse area of mildly erythematous mucosa in the sigmoid and descending and transverse. 6 mm sigmoid polyp found that was flat and removed with the cold snare. Area around the polyp was also biopsied separately. Area was tattooed with spot proximally and distally. 2 flat polyps in the rectum there were 4 mm that were removed. Anal stricture was also found. Pathology returned:  Inactive chronic colitis negative for dysplasia in the proximal colon-no granuloma or inclusions. 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 2. Mildly active chronic colitis negative for dysplasia in the mid colon 3. Low and high-grade dysplasia with focal area suspicious for invasion and the polyp that was removed from the sigmoid. DALM not excluded. 4. Tissue from around the polypectomy site returned with tubulovillous adenoma and low-grade dysplasia 5. Rectosigmoid biopsy returned mildly active chronic colitis negative for dysplasia.  Her pathology is also Glean Salvo out to [facility] for a second pathologist review the findings. By report from Dr.Buccini's office, concordance with our pathologist was maintained and this  lesion is felt to represent a flat area of cancer involving the polyp with surrounding dysplasia that is indistinct. This is not amenable to further polypectomy as per Dr.Buccini. She has been referred for consideration of proctocolectomy.  She has 3 children, one of whom was delivered vaginally. She denies any significant birth trauma seceding with this. She denies any history of incontinence to gas, liquid, or solid stool. Currently, she states she has had diarrhea most of her adult life 3-5 bowel movements per day. This is almost always liquid.  INTERVAL HX She is doing great. She has no questions. States she is ready for surgery. Denies n/v. Tolerated bowel prep well + Ensure. Denies abdominal pain or changes in her health/health history since we met in the office. She understands the procedure, material risks, benefits/alternatives and is aware that her ileostomy will be permanent.    PMH: HTN, HLD, GERD, osteoarthritis, in long-standing history of ulcerative colitis  PSH: Exploratory laparotomy for cecal volvulus, 2017, Dr. Donne Hazel C-section x 2 via Pfannenstiel  FHx: Denies FHx of malignancy or known IBD but she reports her father had GI issues.  Social: Denies use of tobacco/EtOH/drugs. She is retired but previously worked Estate agent at [employer]: A comprehensive 10 system review of systems was completed with the patient and pertinent findings as noted above.  Past Medical History:  Diagnosis Date  . Arthritis   . Bulging lumbar disc   . Cancer ([place])    colon  . Degeneration of lumbar or lumbosacral intervertebral disc 01/21/2015  . Essential hypertension 01/21/2015  . GERD (gastroesophageal reflux disease)   . GERD (gastroesophageal reflux disease) 01/21/2015  . Heart murmur   . Hyperlipidemia   . Hyperlipidemia 01/21/2015  . Hypertension   .  Pre-diabetes    diet  . Ulcerative colitis [facility])     Past Surgical History:  Procedure Laterality Date  .  CESAREAN SECTION    . LAPAROTOMY Right 01/12/2015   Procedure: OPEN RIGHT COLECTOMY;  Surgeon: Rolm Bookbinder, MD;  Location: [place];  Service: General;  Laterality: Right;  . RIGHT COLECTOMY  01/12/2015  . TOTAL KNEE ARTHROPLASTY Left 06/20/2018   Procedure: TOTAL KNEE ARTHROPLASTY;  Surgeon: Dorna Leitz, MD;  Location: [facility];  Service: Orthopedics;  Laterality: Left;    History reviewed. No pertinent family history.  Social:  reports that she has never smoked. She has never used smokeless tobacco. She reports that she does not drink alcohol or use drugs.  Allergies:  Allergies  Allergen Reactions  . Prilosec [Omeprazole]     Made knees hurt and made me sick  . Statins Hives and Other (See Comments)    Muscle spasms     Medications: I have reviewed the patient's current medications.  No results found for this or any previous visit (from the past 48 hour(s)).  No results found.  ROS - all of the below systems have been reviewed with the patient and positives are indicated with bold text General: chills, fever or night sweats Eyes: blurry vision or double vision ENT: epistaxis or sore throat Allergy/Immunology: itchy/watery eyes or nasal congestion Hematologic/Lymphatic: bleeding problems, blood clots or swollen lymph nodes Endocrine: temperature intolerance or unexpected weight changes Breast: new or changing breast lumps or nipple discharge Resp: cough, shortness of breath, or wheezing CV: chest pain or dyspnea on exertion GI: as per HPI GU: dysuria, trouble voiding, or hematuria MSK: joint pain or joint stiffness Neuro: TIA or stroke symptoms Derm: pruritus and skin lesion changes Psych: anxiety and depression  PE Blood pressure 125/78, pulse 72, temperature 98.9 F (37.2 C), temperature source Oral, resp. rate 16, height _0  (1.575 m), weight 85.3 kg, SpO2 98 %. Constitutional: NAD; conversant Eyes: Moist conjunctiva; no lid lag; anicteric; pupils equal and  round Lungs: Normal respiratory effort CV: RRR; no pitting edema GI: Abd soft, NT/ND; ostomy marking has been washed off; no palpable hepatosplenomegaly Psychiatric: Appropriate affect; alert and oriented x3  No results found for this or any previous visit (from the past 48 hour(s)).  No results found.   A/P: [name] is a very pleasant 60yoF with long standing UC, HTN, HLD, GERD, back/left knee pain/arthritis - here for evaluation for total proctocolectomy in the setting of her most recent endoscopic exam 12/21/18 demonstrating a flat sigmoid polyp with at least carcinoma in situ if not an invasive component and surrounding adenomatous tissue (DALM) not amenable to further endoscopic resection due to indistinct margins.  -The anatomy and physiology of the GI tract was discussed at length with the patient. The pathophysiology of UC and colon cancer was discussed at length. -We had a long discussion about all potential options moving forward including restorative proctocolectomy with J-pouch versus total proctocolectomy/end ileostomy. We discussed quality of life therein. We discussed in select patients of her age, the potential option for J-pouch and expectations therein. Should this be pursued, obtaining anorectal manometry prior to making this a final decision.  -She has done much research on her own and has come to the conclusion prior to ourvisit that she was interested in pursuing total proctocolectomy/end ileostomy. We discussed primary ventral hernia repair at the same time without use of mesh due to the contaminated nature of the procedure. We discussed minimally invasive/robotic and  possible open techniques to her surgery. We discussed that ileostomy would be permanent and quality of life considerations and risks therein. -The planned procedures, material risks (including, but not limited to, pain, bleeding, infection, scarring, need for blood transfusion, damage to surrounding  structures- blood vessels/nerves/viscus/organs, damage to ureter, urine leak, leak from rectal stump, need for additional procedures, worsening of pre-existing medical conditions, recurrent hernia, recurrence of cancer, DVT/PE, pneumonia, heart attack, stroke, death) benefits and alternatives to surgery were discussed at length. The patient's questions were answered to her satisfaction, she voiced understanding and elected to proceed with surgery. Additionally, we discussed typical postoperative expectations and the recovery process.  Sharon Mt. Dema Severin, M.D. [facility]

## 2019-05-29 NOTE — Consult Note (Signed)
WOC consulted for preoperative marking.  Patient marked at PAT appt. 05/26/19.  Will follow up post op for ostomy care and teaching.   [place], [place], [place]

## 2019-05-30 LAB — CBC
HCT: 29.2 % — ABNORMAL LOW (ref 36.0–46.0)
Hemoglobin: 9.6 g/dL — ABNORMAL LOW (ref 12.0–15.0)
MCH: 30.7 pg (ref 26.0–34.0)
MCHC: 32.9 g/dL (ref 30.0–36.0)
MCV: 93.3 fL (ref 80.0–100.0)
Platelets: 178 10*3/uL (ref 150–400)
RBC: 3.13 MIL/uL — ABNORMAL LOW (ref 3.87–5.11)
RDW: 13.8 % (ref 11.5–15.5)
WBC: 6.1 10*3/uL (ref 4.0–10.5)
nRBC: 0 % (ref 0.0–0.2)

## 2019-05-30 LAB — BASIC METABOLIC PANEL
Anion gap: 7 (ref 5–15)
BUN: 10 mg/dL (ref 8–23)
CO2: 26 mmol/L (ref 22–32)
Calcium: 8.4 mg/dL — ABNORMAL LOW (ref 8.9–10.3)
Chloride: 103 mmol/L (ref 98–111)
Creatinine, Ser: 0.88 mg/dL (ref 0.44–1.00)
GFR calc Af Amer: >60 mL/min (ref >60)
GFR calc non Af Amer: >60 mL/min (ref >60)
Glucose, Bld: 107 mg/dL — ABNORMAL HIGH (ref 70–99)
Potassium: 3 mmol/L — ABNORMAL LOW (ref 3.5–5.1)
Sodium: 136 mmol/L (ref 135–145)

## 2019-05-30 MED ORDER — HYDROMORPHONE HCL 1 MG/ML IJ SOLN
0.5000 mg | INTRAMUSCULAR | Status: DC | PRN
  Administered 2019-05-30 – 2019-06-03 (×12): 0.5 mg via INTRAVENOUS
  Filled 2019-05-30 (×14): qty 0.5

## 2019-05-30 MED ORDER — DIPHENHYDRAMINE HCL 12.5 MG/5ML PO ELIX
12.5000 mg | ORAL_SOLUTION | Freq: Four times a day (QID) | ORAL | Status: DC | PRN
  Administered 2019-05-30 – 2019-06-03 (×5): 12.5 mg via ORAL
  Filled 2019-05-30 (×5): qty 5

## 2019-05-30 MED ORDER — DIPHENHYDRAMINE HCL 50 MG/ML IJ SOLN: 12.5000 mg | Freq: Four times a day (QID) | INTRAMUSCULAR | Status: DC | PRN

## 2019-05-30 MED ORDER — CARVEDILOL 3.125 MG PO TABS
3.1250 mg | ORAL_TABLET | Freq: Two times a day (BID) | ORAL | Status: DC
  Administered 2019-05-30 – 2019-06-04 (×10): 3.125 mg via ORAL
  Filled 2019-05-30 (×10): qty 1

## 2019-05-30 MED ORDER — CHLORHEXIDINE GLUCONATE CLOTH 2 % EX PADS
6.0000 | MEDICATED_PAD | Freq: Every day | CUTANEOUS | Status: DC
  Administered 2019-05-30 – 2019-06-03 (×5): 6 via TOPICAL

## 2019-05-30 MED ORDER — HYDROCHLOROTHIAZIDE 25 MG PO TABS
25.0000 mg | ORAL_TABLET | Freq: Every day | ORAL | Status: DC
  Administered 2019-05-31 – 2019-06-04 (×5): 25 mg via ORAL
  Filled 2019-05-30 (×6): qty 1

## 2019-05-30 NOTE — Progress Notes (Signed)
Subjective No acute events.   Objective: Vital signs in last 24 hours: Temp:  [97.5 F (36.4 C)-98.9 F (37.2 C)] 98.3 F (36.8 C) (05/29 0532) Pulse Rate:  [76-87] 78 (05/29 0532) Resp:  [10-18] 18 (05/29 0532) BP: (111-175)/(65-81) 153/65 (05/29 0532) SpO2:  [94 %-100 %] 97 % (05/29 0532) Weight:  [82.2 kg] 82.2 kg (05/29 0500) Last BM Date: 05/29/19  Intake/Output from previous day: 05/28 0701 - 05/29 0700 In: 4770.5 [P.O.:840; I.V.:3730.5; IV Piggyback:200] Out: 2528 [Urine:1350; Drains:228; Stool:900; Blood:50] Intake/Output this shift: No intake/output data recorded.  Gen: NAD, comfortable CV: RRR Pulm: Normal work of breathing Abd: Soft, ~no tenderness; not significantly distended; ileostomy pink with succus in appliance Ext: SCDs in place  Lab Results: CBC  Recent Labs    05/30/19 0443  WBC 6.1  HGB 9.6*  HCT 29.2*  PLT 178   BMET Recent Labs    05/30/19 0443  NA 136  K 3.0*  CL 103  CO2 26  GLUCOSE 107*  BUN 10  CREATININE 0.88  CALCIUM 8.4*   PT/INR No results for input(s): LABPROT, INR in the last 72 hours. ABG No results for input(s): PHART, HCO3 in the last 72 hours.  Invalid input(s): PCO2, PO2  Studies/Results:  Anti-infectives: Anti-infectives (From admission, onward)   Start     Dose/Rate Route Frequency Ordered Stop   05/29/19 1400  neomycin (MYCIFRADIN) tablet 1,000 mg  Status:  Discontinued     1,000 mg Oral 3 times per day 05/29/19 0535 05/29/19 1503   05/29/19 1400  metroNIDAZOLE (FLAGYL) tablet 1,000 mg  Status:  Discontinued     1,000 mg Oral 3 times per day 05/29/19 0535 05/29/19 1503   05/29/19 0600  cefoTEtan (CEFOTAN) 2 g in sodium chloride 0.9 % 100 mL IVPB     2 g 200 mL/hr over 30 Minutes Intravenous On call to O.R. 05/29/19 0535 05/29/19 0805       Assessment/Plan: Patient Active Problem List   Diagnosis Date Noted  . S/P total colectomy 05/29/2019  . Primary osteoarthritis of left knee 06/20/2018  .  Essential hypertension 01/21/2015  . GERD (gastroesophageal reflux disease) 01/21/2015  . Degeneration of lumbar or lumbosacral intervertebral disc 01/21/2015  . Hyperlipidemia 01/21/2015  . [name] ([place]) 01/13/2015  . Bilateral carpal tunnel syndrome 01/05/2015   s/p Procedure(s): ROBOTIC TOTAL PROCTOCOLECTOMY, END ILEOSTOMY PRIMARY INCISIONAL HERNIA REPAIR 05/29/2019  -Doing quite well -WOCN for ostomy teaching -Liquids, advancing to soft as tolerated -Ambulate 5x/day -Continue MIVF today -We reviewed OR findings and procedure and answered her questions. She was able to reach her sons whom I tried to get in contact with but was not able to yesterday -PPx: SQH, SCDs  LOS: 1 day   Sharon Mt. Dema Severin, M.D. [facility]

## 2019-05-30 NOTE — Evaluation (Signed)
Physical Therapy Evaluation Patient Details Name: Donna [name] MRN: [id number] DOB: 1945-08-31 Today's Date: 05/30/2019   History of Present Illness  s/p ROBOTIC TOTAL PROCTOCOLECTOMY, END ILEOSTOMYPRIMARY INCISIONAL HERNIA REPAIR 05/29/2019 per Dr. Dema Severin. PMH: L TKA, HTN, GERD, R colectomy  Clinical Impression  Pt admitted with above diagnosis.  Pt is independent to modified independent amb with RW since knee surgery. She is pleasant and motivated to mobilize. Currently her pain is elevated but she is willing to work within limits.  Pt amb ~360' with RW. Will continue to follow to maximize independence prior to return home.   Pt currently with functional limitations due to the deficits listed below (see PT Problem List). Pt will benefit from skilled PT to increase their independence and safety with mobility to allow discharge to the venue listed below.       Follow Up Recommendations No PT follow up    Equipment Recommendations  None recommended by PT    Recommendations for Other Services       Precautions / Restrictions Precautions Precautions: Fall Restrictions Weight Bearing Restrictions: No      Mobility  Bed Mobility Overal bed mobility: Needs Assistance Bed Mobility: Rolling;Sidelying to Sit Rolling: Min guard;Min assist Sidelying to sit: HOB elevated;Min assist       General bed mobility comments: cues for log roll technique, assist to elevate trunk  Transfers Overall transfer level: Needs assistance Equipment used: Rolling walker (2 wheeled) Transfers: Sit to/from Stand Sit to Stand: Min assist         General transfer comment: cues to power up with LEs, assist to rise and transition to RW  Ambulation/Gait Ambulation/Gait assistance: Min guard Gait Distance (Feet): 360 Feet Assistive device: Rolling walker (2 wheeled) Gait Pattern/deviations: Step-through pattern;Trunk flexed;Decreased stride length     General Gait Details: cues for trunk  extension as tolerated, min/guard for safety  Stairs            Wheelchair Mobility    Modified Rankin (Stroke Patients Only)       Balance Overall balance assessment: Needs assistance   Sitting balance-Leahy Scale: Fair Sitting balance - Comments: difficulty wt shifting d/t abd pain     Standing balance-Leahy Scale: Fair Standing balance comment: requires UE support for dynamic tasks                             Pertinent Vitals/Pain Pain Assessment: Faces Faces Pain Scale: Hurts whole lot Pain Location: abdomen Pain Descriptors / Indicators: Grimacing;Sore Pain Intervention(s): Limited activity within patient's tolerance;Monitored during session;Repositioned;RN gave pain meds during session;Patient requesting pain meds-RN notified    Home Living Family/patient expects to be discharged to:: Private residence Living Arrangements: Children Available Help at Discharge: Family;Available PRN/intermittently Type of Home: House Home Access: Stairs to enter Entrance Stairs-Rails: Right;Left;Can reach both Entrance Stairs-Number of Steps: 7-8 or 1 step in back Home Layout: One level Home Equipment: Walker - 2 wheels;Bedside commode;Cane - single point      Prior Function Level of Independence: Independent;Independent with assistive device(s)         Comments: amb with RW     Hand Dominance   Dominant Hand: Right    Extremity/Trunk Assessment   Upper Extremity Assessment Upper Extremity Assessment: Defer to OT evaluation    Lower Extremity Assessment Lower Extremity Assessment: Overall WFL for tasks assessed       Communication   Communication: No difficulties  Cognition Arousal/Alertness: Awake/alert  Behavior During Therapy: WFL for tasks assessed/performed Overall Cognitive Status: Within Functional Limits for tasks assessed                                        General Comments      Exercises     Assessment/Plan     PT Assessment Patient needs continued PT services  PT Problem List Decreased strength;Decreased activity tolerance;Decreased mobility;Pain;Decreased knowledge of use of DME;Decreased balance       PT Treatment Interventions DME instruction;Therapeutic exercise;Functional mobility training;Therapeutic activities;Patient/family education;Gait training    PT Goals (Current goals can be found in the Care Plan section)  Acute Rehab PT Goals Patient Stated Goal: to get back to working in her yard PT Goal Formulation: With patient Time For Goal Achievement: 06/05/19 Potential to Achieve Goals: Good    Frequency Min 3X/week   Barriers to discharge        Co-evaluation               AM-PAC PT "6 Clicks" Mobility  Outcome Measure Help needed turning from your back to your side while in a flat bed without using bedrails?: A Little Help needed moving from lying on your back to sitting on the side of a flat bed without using bedrails?: A Little Help needed moving to and from a bed to a chair (including a wheelchair)?: A Little Help needed standing up from a chair using your arms (e.g., wheelchair or bedside chair)?: A Little Help needed to walk in hospital room?: A Little Help needed climbing 3-5 steps with a railing? : A Little 6 Click Score: 18    End of Session Equipment Utilized During Treatment: Gait belt Activity Tolerance: Patient tolerated treatment well Patient left: with call bell/phone within reach;in chair   PT Visit Diagnosis: Difficulty in walking, not elsewhere classified (R26.2)    Time: 6948-5462 PT Time Calculation (min) (ACUTE ONLY): 26 min   Charges:   PT Evaluation $PT Eval Low Complexity: 1 Low PT Treatments $Gait Training: 8-22 mins        Baxter Flattery, PT   Acute Rehab Dept [facility]): [phone]   05/30/2019   [facility] 05/30/2019, 12:46 PM

## 2019-05-30 NOTE — Progress Notes (Signed)
Occupational Therapy Evaluation  PTA, pt independent with ADL and mobility with occasional use of cane. Pt enjoyed working in her yard. Pt currently requires assist with mobility and ADL due to abdominal pain and generalized weakness. Will follow acutely to maximize functional level of independence to facilitate safe DC home.     05/30/19 1300  OT Visit Information  Last OT Received On 05/30/19  Assistance Needed +1  History of Present Illness s/p ROBOTIC TOTAL PROCTOCOLECTOMY, END ILEOSTOMYPRIMARY INCISIONAL HERNIA REPAIR 05/29/2019 per Dr. Dema Severin. PMH: L TKA, HTN, GERD, R colectomy  Precautions  Precautions Fall  Precaution Comments jp drain; new ileostomy  Home Living  Family/patient expects to be discharged to: Private residence  Living Arrangements Children  Available Help at Discharge Family;Available PRN/intermittently  Type of Home House  Home Access Stairs to enter  Entrance Stairs-Number of Steps 7-8 or 1 step in back  Entrance Stairs-Rails Right;Left;Can reach both  Home Layout One level  Bathroom Shower/Tub Tub/shower unit;Door  Corporate treasurer Yes  How Accessible Accessible via Furniture conservator/restorer - 2 wheels;BSC;Cane - single point  Prior Function  Level of Independence Independent  Comments amb with RW as needed; uses cane at times  Communication  Communication No difficulties  Pain Assessment  Pain Assessment 0-10  Pain Score 8  Pain Location abdomen  Pain Descriptors / Indicators Grimacing;Sore  Pain Intervention(s) Limited activity within patient's tolerance;Repositioned;Premedicated before session  Cognition  Arousal/Alertness Awake/alert  Behavior During Therapy [facility] for tasks assessed/performed  Overall Cognitive Status Within Functional Limits for tasks assessed  Upper Extremity Assessment  Upper Extremity Assessment Overall WFL for tasks assessed  Lower Extremity Assessment  Lower Extremity Assessment Defer to  PT evaluation  Cervical / Trunk Assessment  Cervical / Trunk Assessment Other exceptions (abdominal discomfort)  ADL  Overall ADL's  Needs assistance/impaired  Grooming Set up;Supervision/safety;Standing  Upper Body Bathing Set up;Supervision/ safety;Sitting  Lower Body Bathing Moderate assistance;Sit to/from stand  Upper Body Dressing  Set up;Supervision/safety  Lower Body Dressing Moderate assistance;Sit to/from Primary school teacher Details (indicate cue type and reason) to power up  Toileting- Clothing Manipulation and Hygiene Maximal assistance  Toileting - Clothing Manipulation Details (indicate cue type and reason) t unsure at this time how to manage ileostomy; looked at ileostomy bad first time this session; Began educating pt on higher volume output with ileostomy and options of how to empty bag   Functional mobility during ADLs Minimal assistance  General ADL Comments Difficulty with LB ADL due tprimarily to discomfort/abdominal pain. May benefit from AE Pt has 3in1. Recommended she use that as a shower chair - pt verbalized understanding.   Vision- Assessment  Additional Comments takes drops for glaucoma but her viison is functional  Bed Mobility  General bed mobility comments OOB in chair  Transfers  Overall transfer level Needs assistance  Transfers Sit to/from Stand  Sit to Stand Min assist  General transfer comment Difficulty with anterior weight shift adn pushing up from back of chair due to pain.  Balance  Overall balance assessment Needs assistance  Sitting balance-Leahy Scale Good  Standing balance-Leahy Scale Fair  OT - End of Session  Activity Tolerance Patient limited by pain  Patient left in chair;with call bell/phone within reach  Nurse Communication Mobility status  OT Assessment  OT Recommendation/Assessment Patient needs continued OT Services  OT Visit Diagnosis Unsteadiness on feet (R26.81);Muscle weakness  (generalized) (M62.81);Pain  Pain - part  of body  (abdomen)  OT Problem List Decreased activity tolerance;Decreased knowledge of use of DME or AE;Obesity;Pain  OT Plan  OT Frequency (ACUTE ONLY) Min 2X/week  OT Treatment/Interventions (ACUTE ONLY) Self-care/ADL training;DME and/or AE instruction;Therapeutic activities;Patient/family education  AM-PAC OT "6 Clicks" Daily Activity Outcome Measure (Version 2)  Help from another person eating meals? 4  Help from another person taking care of personal grooming? 3  Help from another person toileting, which includes using toliet, bedpan, or urinal? 3  Help from another person bathing (including washing, rinsing, drying)? 3  Help from another person to put on and taking off regular upper body clothing? 3  Help from another person to put on and taking off regular lower body clothing? 2  6 Click Score 18  OT Recommendation  Follow Up Recommendations No OT follow up;Supervision - Intermittent  OT Equipment None recommended by OT  Individuals Consulted  Consulted and Agree with Results and Recommendations Patient  Acute Rehab OT Goals  Patient Stated Goal to get back to working in her yard  OT Goal Formulation With patient  Time For Goal Achievement 06/13/19  Potential to Achieve Goals Good  OT Time Calculation  OT Start Time (ACUTE ONLY) 1220  OT Stop Time (ACUTE ONLY) 1235  OT Time Calculation (min) 15 min  OT General Charges  $OT Visit 1 Visit  OT Evaluation  $OT Eval Moderate Complexity 1 Mod  Written Expression  Dominant Hand Right  Maurie Boettcher, OT/L   Acute OT Clinical Specialist Acute Rehabilitation Services Pager [phone] Office [phone]

## 2019-05-31 LAB — BASIC METABOLIC PANEL
Anion gap: 7 (ref 5–15)
BUN: 8 mg/dL (ref 8–23)
CO2: 30 mmol/L (ref 22–32)
Calcium: 8.6 mg/dL — ABNORMAL LOW (ref 8.9–10.3)
Chloride: 107 mmol/L (ref 98–111)
Creatinine, Ser: 0.71 mg/dL (ref 0.44–1.00)
GFR calc Af Amer: >60 mL/min (ref >60)
GFR calc non Af Amer: >60 mL/min (ref >60)
Glucose, Bld: 124 mg/dL — ABNORMAL HIGH (ref 70–99)
Potassium: 3.2 mmol/L — ABNORMAL LOW (ref 3.5–5.1)
Sodium: 144 mmol/L (ref 135–145)

## 2019-05-31 LAB — CBC
HCT: 31.2 % — ABNORMAL LOW (ref 36.0–46.0)
Hemoglobin: 10.1 g/dL — ABNORMAL LOW (ref 12.0–15.0)
MCH: 30.8 pg (ref 26.0–34.0)
MCHC: 32.4 g/dL (ref 30.0–36.0)
MCV: 95.1 fL (ref 80.0–100.0)
Platelets: 186 10*3/uL (ref 150–400)
RBC: 3.28 MIL/uL — ABNORMAL LOW (ref 3.87–5.11)
RDW: 14.2 % (ref 11.5–15.5)
WBC: 6.4 10*3/uL (ref 4.0–10.5)
nRBC: 0 % (ref 0.0–0.2)

## 2019-05-31 MED ORDER — KCL IN DEXTROSE-NACL 20-5-0.45 MEQ/L-%-% IV SOLN
INTRAVENOUS | Status: DC
  Filled 2019-05-31 (×2): qty 1000

## 2019-05-31 MED ORDER — POTASSIUM CHLORIDE CRYS ER 20 MEQ PO TBCR
40.0000 meq | EXTENDED_RELEASE_TABLET | Freq: Once | ORAL | Status: AC
  Administered 2019-05-31: 40 meq via ORAL
  Filled 2019-05-31: qty 2

## 2019-05-31 NOTE — Progress Notes (Signed)
Subjective No acute events. Feeling well. Denies n/v. Ambulating without difficulty. Ostomy working well.  Objective: Vital signs in last 24 hours: Temp:  [98.1 F (36.7 C)-99.6 F (37.6 C)] 98.1 F (36.7 C) (05/30 0547) Pulse Rate:  [78-84] 78 (05/30 0547) Resp:  [16-19] 19 (05/30 0547) BP: (142-175)/(56-78) 154/72 (05/30 0547) SpO2:  [94 %-97 %] 97 % (05/30 0547) Weight:  [89.9 kg] 89.9 kg (05/30 0500) Last BM Date: 05/30/19  Intake/Output from previous day: 05/29 0701 - 05/30 0700 In: 2944.1 [P.O.:1460; I.V.:1484.1] Out: 4190 [Urine:3200; Drains:295; Stool:695] Intake/Output this shift: No intake/output data recorded.  Gen: NAD, comfortable CV: RRR Pulm: Normal work of breathing Abd: Soft, ~no tenderness; not significantly distended; ileostomy pink with succus in appliance Ext: SCDs in place  Lab Results: CBC  Recent Labs    05/30/19 0443 05/31/19 0422  WBC 6.1 6.4  HGB 9.6* 10.1*  HCT 29.2* 31.2*  PLT 178 186   BMET Recent Labs    05/30/19 0443 05/31/19 0422  NA 136 144  K 3.0* 3.2*  CL 103 107  CO2 26 30  GLUCOSE 107* 124*  BUN 10 8  CREATININE 0.88 0.71  CALCIUM 8.4* 8.6*   PT/INR No results for input(s): LABPROT, INR in the last 72 hours. ABG No results for input(s): PHART, HCO3 in the last 72 hours.  Invalid input(s): PCO2, PO2  Studies/Results:  Anti-infectives: Anti-infectives (From admission, onward)   Start     Dose/Rate Route Frequency Ordered Stop   05/29/19 1400  neomycin (MYCIFRADIN) tablet 1,000 mg  Status:  Discontinued     1,000 mg Oral 3 times per day 05/29/19 0535 05/29/19 1503   05/29/19 1400  metroNIDAZOLE (FLAGYL) tablet 1,000 mg  Status:  Discontinued     1,000 mg Oral 3 times per day 05/29/19 0535 05/29/19 1503   05/29/19 0600  cefoTEtan (CEFOTAN) 2 g in sodium chloride 0.9 % 100 mL IVPB     2 g 200 mL/hr over 30 Minutes Intravenous On call to O.R. 05/29/19 0535 05/29/19 0805       Assessment/Plan: Patient Active  Problem List   Diagnosis Date Noted  . S/P total colectomy 05/29/2019  . Primary osteoarthritis of left knee 06/20/2018  . Essential hypertension 01/21/2015  . GERD (gastroesophageal reflux disease) 01/21/2015  . Degeneration of lumbar or lumbosacral intervertebral disc 01/21/2015  . Hyperlipidemia 01/21/2015  . [name] ([place]) 01/13/2015  . Bilateral carpal tunnel syndrome 01/05/2015   s/p Procedure(s): ROBOTIC TOTAL PROCTOCOLECTOMY, END ILEOSTOMY PRIMARY INCISIONAL HERNIA REPAIR 05/29/2019  -Doing quite well -WOCN for ostomy teaching -Soft diet as tolerated -Ambulate 5x/day -Continue MIVF -D/C Foley -PPx: SQH, SCDs  LOS: 2 days   Sharon Mt. Dema Severin, M.D. [facility]

## 2019-06-01 LAB — CBC
HCT: 31.9 % — ABNORMAL LOW (ref 36.0–46.0)
Hemoglobin: 10.4 g/dL — ABNORMAL LOW (ref 12.0–15.0)
MCH: 31.2 pg (ref 26.0–34.0)
MCHC: 32.6 g/dL (ref 30.0–36.0)
MCV: 95.8 fL (ref 80.0–100.0)
Platelets: 191 10*3/uL (ref 150–400)
RBC: 3.33 MIL/uL — ABNORMAL LOW (ref 3.87–5.11)
RDW: 14.4 % (ref 11.5–15.5)
WBC: 7 10*3/uL (ref 4.0–10.5)
nRBC: 0 % (ref 0.0–0.2)

## 2019-06-01 LAB — BASIC METABOLIC PANEL
Anion gap: 6 (ref 5–15)
BUN: 12 mg/dL (ref 8–23)
CO2: 28 mmol/L (ref 22–32)
Calcium: 8.8 mg/dL — ABNORMAL LOW (ref 8.9–10.3)
Chloride: 108 mmol/L (ref 98–111)
Creatinine, Ser: 0.86 mg/dL (ref 0.44–1.00)
GFR calc Af Amer: >60 mL/min (ref >60)
GFR calc non Af Amer: >60 mL/min (ref >60)
Glucose, Bld: 132 mg/dL — ABNORMAL HIGH (ref 70–99)
Potassium: 3.7 mmol/L (ref 3.5–5.1)
Sodium: 142 mmol/L (ref 135–145)

## 2019-06-01 MED ORDER — POTASSIUM CHLORIDE CRYS ER 20 MEQ PO TBCR
40.0000 meq | EXTENDED_RELEASE_TABLET | Freq: Once | ORAL | Status: AC
  Administered 2019-06-01: 40 meq via ORAL
  Filled 2019-06-01: qty 2

## 2019-06-01 MED ORDER — PSYLLIUM 95 % PO PACK
1.0000 | PACK | Freq: Every day | ORAL | Status: DC
  Administered 2019-06-01 – 2019-06-04 (×4): 1 via ORAL
  Filled 2019-06-01 (×4): qty 1

## 2019-06-01 NOTE — Progress Notes (Signed)
Occupational Therapy Treatment Patient Details Name: Donna [name] MRN: [id number] DOB: October 14, 1945 Today's Date: 06/01/2019    History of present illness s/p ROBOTIC TOTAL PROCTOCOLECTOMY, END ILEOSTOMYPRIMARY INCISIONAL HERNIA REPAIR 05/29/2019 per Dr. Dema Severin. PMH: L TKA, HTN, GERD, R colectomy   OT comments  Patient progressing well towards OT goals, reports decreased pain in abdomen today. Session focused on toilet transfer with safety for body mechanics and LB dressing with AE. Provide visual demo of reacher and sock aid, patient use sock aid but also able to demo figure 4 method to pull up socks seated in chair. Will continue to follow.   Follow Up Recommendations  No OT follow up;Supervision - Intermittent    Equipment Recommendations  Other (comment)(reacher)       Precautions / Restrictions Precautions Precautions: Fall Precaution Comments:  new ileostomy Restrictions Weight Bearing Restrictions: No       Mobility Bed Mobility Overal bed mobility: Needs Assistance Bed Mobility: Rolling;Sidelying to Sit Rolling: Min guard Sidelying to sit: Min guard       General bed mobility comments: min guard for safety  Transfers Overall transfer level: Needs assistance Equipment used: Rolling walker (2 wheeled) Transfers: Sit to/from Stand Sit to Stand: Min guard         General transfer comment: cues for body mechanics    Balance Overall balance assessment: Needs assistance Sitting-balance support: No upper extremity supported;Feet supported Sitting balance-Leahy Scale: Good     Standing balance support: Bilateral upper extremity supported;During functional activity Standing balance-Leahy Scale: Fair Standing balance comment: able to stand without walker, use of walker for dynamic tasks                           ADL either performed or assessed with clinical judgement   ADL Overall ADL's : Needs assistance/impaired                      Lower Body Dressing: Cueing for compensatory techniques;With adaptive equipment;Supervision/safety;Sitting/lateral leans Lower Body Dressing Details (indicate cue type and reason): pt demo ability to pull up socks using figure 4 method, did demo reacher for donning underwear, shorts pt verbalize understanding Toilet Transfer: Min Agricultural engineer Details (indicate cue type and reason): min guard for safety, cues for hand placement with transfer onto toilet and when sitting into chair for improved eccentric control Toileting- Clothing Manipulation and Hygiene: Supervision/safety;Sitting/lateral lean Toileting - Clothing Manipulation Details (indicate cue type and reason): after voiding     Functional mobility during ADLs: Min guard;Rolling walker;Cueing for safety                 Cognition Arousal/Alertness: Awake/alert Behavior During Therapy: WFL for tasks assessed/performed Overall Cognitive Status: Within Functional Limits for tasks assessed                                 General Comments: very pleasant and motivated                   Pertinent Vitals/ Pain       Pain Assessment: Faces Faces Pain Scale: Hurts a little bit Pain Location: abdomen Pain Descriptors / Indicators: Grimacing;Sore Pain Intervention(s): Monitored during session         Frequency  Min 2X/week        Progress Toward Goals  OT Goals(current goals can now be found in the  care plan section)  Progress towards OT goals: Progressing toward goals  Acute Rehab OT Goals Patient Stated Goal: to get back to working in her yard OT Goal Formulation: With patient Time For Goal Achievement: 06/13/19 Potential to Achieve Goals: Good ADL Goals Pt Will Perform Lower Body Bathing: with modified independence;sit to/from stand;with adaptive equipment Pt Will Perform Lower Body Dressing: with modified independence;sit to/from stand;with adaptive equipment Pt Will  Transfer to Toilet: with modified independence;ambulating Additional ADL Goal #1: Pt will independently demonstrate the ability to empty her ileostomy bag Additional ADL Goal #2: Pt will verbalize 3 strategies to reduce risk of falls  Plan Discharge plan remains appropriate       AM-PAC OT "6 Clicks" Daily Activity     Outcome Measure   Help from another person eating meals?: None Help from another person taking care of personal grooming?: A Little Help from another person toileting, which includes using toliet, bedpan, or urinal?: A Little Help from another person bathing (including washing, rinsing, drying)?: A Little Help from another person to put on and taking off regular upper body clothing?: A Little Help from another person to put on and taking off regular lower body clothing?: A Little 6 Click Score: 19    End of Session Equipment Utilized During Treatment: Rolling walker  OT Visit Diagnosis: Unsteadiness on feet (R26.81);Muscle weakness (generalized) (M62.81);Pain Pain - part of body: (abdomen)   Activity Tolerance Patient tolerated treatment well   Patient Left in chair;with call bell/phone within reach           Time: 0730-0757 OT Time Calculation (min): 27 min  Charges: OT General Charges $OT Visit: 1 Visit OT Treatments $Self Care/Home Management : 23-37 mins  Delbert Phenix OT Pager: [place] 06/01/2019, 11:13 AM

## 2019-06-01 NOTE — Progress Notes (Signed)
Physical Therapy Treatment Patient Details Name: Donna [name] MRN: [id number] DOB: [birth year] Today's Date: 06/01/2019    History of Present Illness s/p ROBOTIC TOTAL PROCTOCOLECTOMY, END ILEOSTOMYPRIMARY INCISIONAL HERNIA REPAIR 05/29/2019 per Dr. Dema Severin. PMH: L TKA, HTN, GERD, R colectomy    PT Comments    Pt with overall improved gait pattern,  incr gait velocity. Pt feels ready to d/c home today. Encouraged to incr activity as tolerated.   Follow Up Recommendations  No PT follow up     Equipment Recommendations  None recommended by PT    Recommendations for Other Services       Precautions / Restrictions Precautions Precautions: Fall Precaution Comments:  new ileostomy Restrictions Weight Bearing Restrictions: No    Mobility  Bed Mobility               General bed mobility comments: OOB in chair  Transfers Overall transfer level: Needs assistance Equipment used: Rolling walker (2 wheeled) Transfers: Sit to/from Stand Sit to Stand: Supervision         General transfer comment: for safety   Ambulation/Gait Ambulation/Gait assistance: Min guard;Supervision Gait Distance (Feet): 380 Feet Assistive device: Rolling walker (2 wheeled) Gait Pattern/deviations: Step-through pattern;Trunk flexed;Decreased stride length     General Gait Details: cues for trunk extension as tolerated, min/guard for safety   Stairs             Wheelchair Mobility    Modified Rankin (Stroke Patients Only)       Balance             Standing balance-Leahy Scale: Fair Standing balance comment: requires UE support for dynamic tasks                            Cognition Arousal/Alertness: Awake/alert Behavior During Therapy: WFL for tasks assessed/performed Overall Cognitive Status: Within Functional Limits for tasks assessed                                 General Comments: very pleasant and motivated      Exercises       General Comments        Pertinent Vitals/Pain Faces Pain Scale: Hurts little more Pain Location: abdomen Pain Descriptors / Indicators: Grimacing;Sore Pain Intervention(s): Limited activity within patient's tolerance;Monitored during session;Repositioned;Patient requesting pain meds-RN notified    Home Living                      Prior Function            PT Goals (current goals can now be found in the care plan section) Acute Rehab PT Goals Patient Stated Goal: to get back to working in her yard PT Goal Formulation: With patient Time For Goal Achievement: 06/05/19 Potential to Achieve Goals: Good Progress towards PT goals: Progressing toward goals    Frequency    Min 3X/week      PT Plan Current plan remains appropriate    Co-evaluation              AM-PAC PT "6 Clicks" Mobility   Outcome Measure  Help needed turning from your back to your side while in a flat bed without using bedrails?: A Little Help needed moving from lying on your back to sitting on the side of a flat bed without using bedrails?: A Little Help needed moving to and  from a bed to a chair (including a wheelchair)?: None Help needed standing up from a chair using your arms (e.g., wheelchair or bedside chair)?: None Help needed to walk in hospital room?: A Little Help needed climbing 3-5 steps with a railing? : A Little 6 Click Score: 20    End of Session Equipment Utilized During Treatment: Gait belt Activity Tolerance: Patient tolerated treatment well Patient left: with call bell/phone within reach;in chair;Other (comment)(WOC RN )   PT Visit Diagnosis: Difficulty in walking, not elsewhere classified (R26.2)     Time: 5329-9242 PT Time Calculation (min) (ACUTE ONLY): 13 min  Charges:  $Gait Training: 8-22 mins                     Baxter Flattery, PT   Acute Rehab Dept [facility]): [phone]   06/01/2019    [facility] 06/01/2019, 10:21 AM

## 2019-06-01 NOTE — Consult Note (Signed)
[place] Nurse ostomy follow up Stoma type/location:  Pt had ileostomy surgery performed on 5/28 Stomal assessment/size:  Stoma is red and viable, above skin level, 1 1/2 inches Peristomal assessment:  Intact skin surrounding Output: 75cc brown liquid stool  Ostomy pouching: 1pc.  Education provided:  Demonstrated pouch change using barrier ring and one piece pouch.  Pt assisted and asked approp questions.  She was able to open and close to empty.  Reviewed pouching routines and ordering supplies. Extra supplies ordered to the room and educational materials at the bedside. Discussed dietary precautions.  Enrolled patient in [facility] Start Discharge program: Yes [place] team will follow for further teaching sessions.  Julien Girt MSN, RN, [place], [place], [place]

## 2019-06-01 NOTE — Progress Notes (Addendum)
Subjective No acute events. Feeling well. Denies n/v. Ambulating without difficulty. Ostomy working well.  Objective: Vital signs in last 24 hours: Temp:  [97.4 F (36.3 C)-98.9 F (37.2 C)] 98.7 F (37.1 C) (05/31 0511) Pulse Rate:  [81-85] 85 (05/31 0807) Resp:  [18-19] 18 (05/31 0511) BP: (100-156)/(64-88) 131/64 (05/31 0807) SpO2:  [95 %-97 %] 97 % (05/31 0511) Last BM Date: 05/31/19  Intake/Output from previous day: 05/30 0701 - 05/31 0700 In: 2066.3 [P.O.:1200; I.V.:243.3] Out: 1585 [Drains:110; JSEGB:1517] Intake/Output this shift: Total I/O In: -  Out: 1105 [Urine:550; Drains:55; Stool:500]  Gen: NAD, comfortable CV: RRR Pulm: Normal work of breathing Abd: Soft, ~no tenderness; not significantly distended; ileostomy pink with succus in appliance. JP drain thin serous Ext: SCDs in place  Lab Results: CBC  Recent Labs    05/31/19 0422 06/01/19 0419  WBC 6.4 7.0  HGB 10.1* 10.4*  HCT 31.2* 31.9*  PLT 186 191   BMET Recent Labs    05/31/19 0422 06/01/19 0419  NA 144 142  K 3.2* 3.7  CL 107 108  CO2 30 28  GLUCOSE 124* 132*  BUN 8 12  CREATININE 0.71 0.86  CALCIUM 8.6* 8.8*   PT/INR No results for input(s): LABPROT, INR in the last 72 hours. ABG No results for input(s): PHART, HCO3 in the last 72 hours.  Invalid input(s): PCO2, PO2  Studies/Results:  Anti-infectives: Anti-infectives (From admission, onward)   Start     Dose/Rate Route Frequency Ordered Stop   05/29/19 1400  neomycin (MYCIFRADIN) tablet 1,000 mg  Status:  Discontinued     1,000 mg Oral 3 times per day 05/29/19 0535 05/29/19 1503   05/29/19 1400  metroNIDAZOLE (FLAGYL) tablet 1,000 mg  Status:  Discontinued     1,000 mg Oral 3 times per day 05/29/19 0535 05/29/19 1503   05/29/19 0600  cefoTEtan (CEFOTAN) 2 g in sodium chloride 0.9 % 100 mL IVPB     2 g 200 mL/hr over 30 Minutes Intravenous On call to O.R. 05/29/19 0535 05/29/19 0805       Assessment/Plan: Patient Active  Problem List   Diagnosis Date Noted  . S/P total colectomy 05/29/2019  . Primary osteoarthritis of left knee 06/20/2018  . Essential hypertension 01/21/2015  . GERD (gastroesophageal reflux disease) 01/21/2015  . Degeneration of lumbar or lumbosacral intervertebral disc 01/21/2015  . Hyperlipidemia 01/21/2015  . [name] ([place]) 01/13/2015  . Bilateral carpal tunnel syndrome 01/05/2015   s/p Procedure(s): ROBOTIC TOTAL PROCTOCOLECTOMY, END ILEOSTOMY PRIMARY INCISIONAL HERNIA REPAIR 05/29/2019  -Doing quite well -WOCN for ostomy teaching pending -Home health ordered to assist in transition to home environment -Soft diet as tolerated -Ambulate 5x/day -Ileostomy 1.5L/24hrs - hopefully now that on soft diet begins to thicken - will add metamucil today -D/C MIVF today -D/C JP drain -PPx: SQH, SCDs  LOS: 3 days   Sharon Mt. Dema Severin, M.D. [facility]

## 2019-06-01 NOTE — Progress Notes (Signed)
JP drain removed without any complications. Pt denied pain.

## 2019-06-02 LAB — BASIC METABOLIC PANEL
Anion gap: 10 (ref 5–15)
BUN: 17 mg/dL (ref 8–23)
CO2: 24 mmol/L (ref 22–32)
Calcium: 8.6 mg/dL — ABNORMAL LOW (ref 8.9–10.3)
Chloride: 108 mmol/L (ref 98–111)
Creatinine, Ser: 1.01 mg/dL — ABNORMAL HIGH (ref 0.44–1.00)
GFR calc Af Amer: >60 mL/min (ref >60)
GFR calc non Af Amer: 56 mL/min — ABNORMAL LOW (ref >60)
Glucose, Bld: 110 mg/dL — ABNORMAL HIGH (ref 70–99)
Potassium: 4.2 mmol/L (ref 3.5–5.1)
Sodium: 142 mmol/L (ref 135–145)

## 2019-06-02 MED ORDER — LACTATED RINGERS IV BOLUS
500.0000 mL | Freq: Once | INTRAVENOUS | Status: AC
  Administered 2019-06-02: 500 mL via INTRAVENOUS

## 2019-06-02 MED ORDER — LACTATED RINGERS IV SOLN: INTRAVENOUS | Status: DC

## 2019-06-02 MED ORDER — LOPERAMIDE HCL 2 MG PO CAPS
2.0000 mg | ORAL_CAPSULE | Freq: Two times a day (BID) | ORAL | Status: DC
  Administered 2019-06-02 – 2019-06-04 (×5): 2 mg via ORAL
  Filled 2019-06-02 (×6): qty 1

## 2019-06-02 NOTE — Consult Note (Signed)
[place] Nurse ostomy follow up Stoma type/location: RLQ ileostomy  Pouch changed yesterday and patient observed   She is here for a few more days.  We have agreed to do pouch change tomorrow 06/03/19 and she would like to measure cut and apply. Her discharge disposition will be home with Home Health.   Pouch is emptied by patient with minimal assistance.   [place] team will follow.  Domenic Moras MSN, RN, FNP-BC [facility] Wound, Ostomy, Continence Nurse Pager [phone]

## 2019-06-02 NOTE — Progress Notes (Signed)
Physical Therapy Treatment Patient Details Name: Donna [name] MRN: [id number] DOB: [birth year] Today's Date: 06/02/2019    History of Present Illness s/p ROBOTIC TOTAL PROCTOCOLECTOMY, END ILEOSTOMYPRIMARY INCISIONAL HERNIA REPAIR 05/29/2019 per Dr. Dema Severin. PMH: L TKA, HTN, GERD, R colectomy    PT Comments    Pt has not ambulated today and agreeable to mobilize.  Pt with gas collection in colostomy bag so released air as pt reports discomfort with air prior to getting out of bed.  Pt ambulated with RW mostly for comfort at this time.  Pt encouraged to ambulate with nursing staff (she reports alarms go off if she tries to get OOB herself, so encouraged her to call out for initial help at least, ie turning off alarms and would need assist for IV pole if connected).  Pt anticipates d/c home in a few days.      Follow Up Recommendations  No PT follow up     Equipment Recommendations  None recommended by PT    Recommendations for Other Services       Precautions / Restrictions Precautions Precautions: Fall Precaution Comments:  new ileostomy    Mobility  Bed Mobility Overal bed mobility: Needs Assistance Bed Mobility: Supine to Sit     Supine to sit: Min guard;HOB elevated     General bed mobility comments: increased time and effort  Transfers Overall transfer level: Needs assistance Equipment used: Rolling walker (2 wheeled) Transfers: Sit to/from Stand Sit to Stand: Min guard         General transfer comment: min/guard for safety, slow transfers  Ambulation/Gait Ambulation/Gait assistance: Supervision;Min guard Gait Distance (Feet): 500 Feet Assistive device: Rolling walker (2 wheeled) Gait Pattern/deviations: Step-through pattern;Trunk flexed;Decreased stride length     General Gait Details: verbal cues for posture and RW positioning; pt prefers using RW at this time due to L knee pain (likely more for comfort then stability)   Stairs              Wheelchair Mobility    Modified Rankin (Stroke Patients Only)       Balance                                            Cognition Arousal/Alertness: Awake/alert Behavior During Therapy: WFL for tasks assessed/performed Overall Cognitive Status: Within Functional Limits for tasks assessed                                 General Comments: very pleasant and motivated      Exercises      General Comments        Pertinent Vitals/Pain Pain Assessment: Faces Faces Pain Scale: Hurts a little bit Pain Location: abdomen Pain Descriptors / Indicators: Grimacing;Sore Pain Intervention(s): Repositioned;Monitored during session    Home Living                      Prior Function            PT Goals (current goals can now be found in the care plan section) Progress towards PT goals: Progressing toward goals    Frequency    Min 3X/week      PT Plan Current plan remains appropriate    Co-evaluation  AM-PAC PT "6 Clicks" Mobility   Outcome Measure  Help needed turning from your back to your side while in a flat bed without using bedrails?: A Little Help needed moving from lying on your back to sitting on the side of a flat bed without using bedrails?: A Little Help needed moving to and from a bed to a chair (including a wheelchair)?: A Little Help needed standing up from a chair using your arms (e.g., wheelchair or bedside chair)?: A Little Help needed to walk in hospital room?: A Little Help needed climbing 3-5 steps with a railing? : A Little 6 Click Score: 18    End of Session Equipment Utilized During Treatment: Gait belt Activity Tolerance: Patient tolerated treatment well Patient left: in bed;with call bell/phone within reach;with bed alarm set   PT Visit Diagnosis: Difficulty in walking, not elsewhere classified (R26.2)     Time: 6148-3073 PT Time Calculation (min) (ACUTE ONLY): 16  min  Charges:  $Gait Training: 8-22 mins                    Arlyce Dice, DPT Acute Rehabilitation Services Office: [phone]  Trena Platt 06/02/2019, 2:43 PM

## 2019-06-02 NOTE — Plan of Care (Signed)
  Problem: Clinical Measurements: Goal: Will remain free from infection Outcome: Progressing   Problem: Clinical Measurements: Goal: Respiratory complications will improve Outcome: Progressing   Problem: Clinical Measurements: Goal: Cardiovascular complication will be avoided Outcome: Progressing   Problem: Nutrition: Goal: Adequate nutrition will be maintained Outcome: Progressing   Problem: Coping: Goal: Level of anxiety will decrease Outcome: Progressing   Problem: Pain Managment: Goal: General experience of comfort will improve Outcome: Progressing   Problem: Safety: Goal: Ability to remain free from injury will improve Outcome: Progressing   Problem: Skin Integrity: Goal: Demonstration of wound healing without infection will improve Outcome: Progressing

## 2019-06-02 NOTE — Progress Notes (Signed)
Subjective No acute events. Feeling great.  Objective: Vital signs in last 24 hours: Temp:  [98.2 F (36.8 C)-98.8 F (37.1 C)] 98.2 F (36.8 C) (06/01 0611) Pulse Rate:  [78-85] 78 (06/01 0611) Resp:  [17-19] 18 (06/01 0611) BP: (126-154)/(64-73) 148/69 (06/01 0611) SpO2:  [98 %-100 %] 98 % (06/01 0611) Weight:  [86.9 kg] 86.9 kg (06/01 0500) Last BM Date: 06/01/19  Intake/Output from previous day: 05/31 0701 - 06/01 0700 In: 2064.6 [P.O.:1260; I.V.:804.6] Out: 4625 [Urine:2550; Drains:125; VQXIH:0388] Intake/Output this shift: No intake/output data recorded.  Gen: NAD, comfortable CV: RRR Pulm: Normal work of breathing Abd: Soft, NT/ND; incision c/d/i; ileostomy pink with thin succus in appliance Ext: SCDs in place  Lab Results: CBC  Recent Labs    05/31/19 0422 06/01/19 0419  WBC 6.4 7.0  HGB 10.1* 10.4*  HCT 31.2* 31.9*  PLT 186 191   BMET Recent Labs    06/01/19 0419 06/02/19 0343  NA 142 142  K 3.7 4.2  CL 108 108  CO2 28 24  GLUCOSE 132* 110*  BUN 12 17  CREATININE 0.86 1.01*  CALCIUM 8.8* 8.6*   PT/INR No results for input(s): LABPROT, INR in the last 72 hours. ABG No results for input(s): PHART, HCO3 in the last 72 hours.  Invalid input(s): PCO2, PO2  Studies/Results:  Anti-infectives: Anti-infectives (From admission, onward)   Start     Dose/Rate Route Frequency Ordered Stop   05/29/19 1400  neomycin (MYCIFRADIN) tablet 1,000 mg  Status:  Discontinued     1,000 mg Oral 3 times per day 05/29/19 0535 05/29/19 1503   05/29/19 1400  metroNIDAZOLE (FLAGYL) tablet 1,000 mg  Status:  Discontinued     1,000 mg Oral 3 times per day 05/29/19 0535 05/29/19 1503   05/29/19 0600  cefoTEtan (CEFOTAN) 2 g in sodium chloride 0.9 % 100 mL IVPB     2 g 200 mL/hr over 30 Minutes Intravenous On call to O.R. 05/29/19 0535 05/29/19 0805       Assessment/Plan: Patient Active Problem List   Diagnosis Date Noted  . S/P total colectomy 05/29/2019  .  Primary osteoarthritis of left knee 06/20/2018  . Essential hypertension 01/21/2015  . GERD (gastroesophageal reflux disease) 01/21/2015  . Degeneration of lumbar or lumbosacral intervertebral disc 01/21/2015  . Hyperlipidemia 01/21/2015  . [name] ([place]) 01/13/2015  . Bilateral carpal tunnel syndrome 01/05/2015   s/p Procedure(s): ROBOTIC TOTAL PROCTOCOLECTOMY, END ILEOSTOMY PRIMARY INCISIONAL HERNIA REPAIR 05/29/2019  -Doing quite well -WOCN for ostomy teaching - appreciate assistance -Home health ordered to assist in transition to home environment -Soft diet as tolerated; avoid concentrated sweets -Ambulate 5x/day -Ileostomy 1.9L/24hrs - metamucil; start imodium 2 mg BID -Restart MIVF -PPx: SQH, SCDs   LOS: 4 days   Sharon Mt. Dema Severin, M.D. [facility]

## 2019-06-02 NOTE — Care Management Important Message (Signed)
Important Message  Patient Details IM Letter given to [place] Case Manager to present to the Patient Name: Donna [name] MRN: [id number] Date of Birth: 1945-03-05   Medicare Important Message Given:  Yes     Kerin Salen 06/02/2019, 12:28 PM

## 2019-06-02 NOTE — TOC Transition Note (Signed)
Transition of Care (TOC) - CM/SW Discharge Note   Patient Details  Name: Donna [name] MRN: [id number] Date of Birth: 10/07/1945  Transition of Care (TOC) CM/SW Contact:  , , LCSW Phone Number: 06/02/2019, 11:13 AM   Clinical Narrative:  Met with pt yesterday and today to review d/c needs.  Pt hopeful for d/c soon, however, with increased output today.  Home with two sons who are very supportive.  Pt feeling "very comfortable" with ostomy care.  Have arranged HHRN  With [facility].  No further TOC needs at this time.     Final next level of care: Home w Home Health Services Barriers to Discharge: Continued Medical Work up   Patient Goals and CMS Choice Patient states their goals for this hospitalization and ongoing recovery are:: hopes to be home within the next day CMS Medicare.gov Compare Post Acute Care list provided to:: Patient Choice offered to / list presented to : Patient  Discharge Placement                       Discharge Plan and Services                DME Arranged: N/A DME Agency: NA       HH Arranged: RN HH Agency: [facility] (now [facility]) Date HH Agency Contacted: 06/01/19 Time HH Agency Contacted: 1400 Representative spoke with at HH Agency: Mike  Social Determinants of Health (SDOH) Interventions     Readmission Risk Interventions No flowsheet data found.     

## 2019-06-03 LAB — BASIC METABOLIC PANEL
Anion gap: 8 (ref 5–15)
BUN: 16 mg/dL (ref 8–23)
CO2: 27 mmol/L (ref 22–32)
Calcium: 8.8 mg/dL — ABNORMAL LOW (ref 8.9–10.3)
Chloride: 107 mmol/L (ref 98–111)
Creatinine, Ser: 0.81 mg/dL (ref 0.44–1.00)
GFR calc Af Amer: >60 mL/min (ref >60)
GFR calc non Af Amer: >60 mL/min (ref >60)
Glucose, Bld: 114 mg/dL — ABNORMAL HIGH (ref 70–99)
Potassium: 4.2 mmol/L (ref 3.5–5.1)
Sodium: 142 mmol/L (ref 135–145)

## 2019-06-03 NOTE — Progress Notes (Signed)
Pt independently emptied her ileostomy pouch in restroom. Pt confident to continue emptying independently.

## 2019-06-03 NOTE — Progress Notes (Signed)
Subjective No acute events. Feeling great. Working on learning how to manage stoma  Objective: Vital signs in last 24 hours: Temp:  [98.5 F (36.9 C)-99.6 F (37.6 C)] 98.6 F (37 C) (06/02 0604) Pulse Rate:  [83-84] 84 (06/02 0604) Resp:  [11-19] 18 (06/02 0604) BP: (134-152)/(65-92) 152/92 (06/02 0604) SpO2:  [97 %-100 %] 100 % (06/02 0604) Weight:  [86.5 kg] 86.5 kg (06/02 0601) Last BM Date: 06/03/19  Intake/Output from previous day: 06/01 0701 - 06/02 0700 In: 3955.5 [P.O.:1800; I.V.:1602.5; IV Piggyback:553] Out: 3700 [Urine:2800; Stool:900] Intake/Output this shift: No intake/output data recorded.  Gen: NAD, comfortable CV: RRR Pulm: Normal work of breathing Abd: Soft, NT/ND; incision c/d/i; ileostomy pink with thin succus in appliance Ext: SCDs in place  Lab Results: CBC  Recent Labs    06/01/19 0419  WBC 7.0  HGB 10.4*  HCT 31.9*  PLT 191   BMET Recent Labs    06/02/19 0343 06/03/19 0451  NA 142 142  K 4.2 4.2  CL 108 107  CO2 24 27  GLUCOSE 110* 114*  BUN 17 16  CREATININE 1.01* 0.81  CALCIUM 8.6* 8.8*   PT/INR No results for input(s): LABPROT, INR in the last 72 hours. ABG No results for input(s): PHART, HCO3 in the last 72 hours.  Invalid input(s): PCO2, PO2  Studies/Results:  Anti-infectives: Anti-infectives (From admission, onward)   Start     Dose/Rate Route Frequency Ordered Stop   05/29/19 1400  neomycin (MYCIFRADIN) tablet 1,000 mg  Status:  Discontinued     1,000 mg Oral 3 times per day 05/29/19 0535 05/29/19 1503   05/29/19 1400  metroNIDAZOLE (FLAGYL) tablet 1,000 mg  Status:  Discontinued     1,000 mg Oral 3 times per day 05/29/19 0535 05/29/19 1503   05/29/19 0600  cefoTEtan (CEFOTAN) 2 g in sodium chloride 0.9 % 100 mL IVPB     2 g 200 mL/hr over 30 Minutes Intravenous On call to O.R. 05/29/19 0535 05/29/19 0805       Assessment/Plan: Patient Active Problem List   Diagnosis Date Noted  . S/P total colectomy  05/29/2019  . Primary osteoarthritis of left knee 06/20/2018  . Essential hypertension 01/21/2015  . GERD (gastroesophageal reflux disease) 01/21/2015  . Degeneration of lumbar or lumbosacral intervertebral disc 01/21/2015  . Hyperlipidemia 01/21/2015  . [name] ([place]) 01/13/2015  . 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 We reviewed these results with Ms. Gurevich this morning. -WOCN for ostomy teaching - appreciate assistance - she is planning another teaching session today. She is otherwise clear for discharge medically but does not yet feel comfortable managing his ileostomy completely independently. Hopeful today will help. Output has thickened and at target range. Will need to empty/record volume at home, reporting 24hr outputs and if >1.2L/24hrs, notify our office for further instruction. -Home health ordered to assist in transition to home environment -Soft diet as tolerated; avoid concentrated sweets -Ambulate 5x/day -Ileostomy 900cc/24hrs - metamucil; continue imodium 2 mg BID -D/C MIVF -PPx: SQH, SCDs   LOS: 5 days   Sharon Mt. Dema Severin, M.D. [facility]

## 2019-06-03 NOTE — Consult Note (Signed)
[place] Nurse ostomy follow up Stoma type/location: RLQ ileostomy  Patient performs pouch change today and is enrolled in secure start.  Stomal assessment/size: 1 1/2 " pink and moist  Tenderness to peristomal skin and skin is pink and intact  Sutures are tight and pulling, edema is resolving Peristomal assessment: see above Treatment options for stomal area:  1 piece flat pouch with barrier ring, has creasing at 3 and 9 o'clock Education provided: Patient removes old pouch, cleanses skin and pat dry. Applies barrier ring and cuts pouch to fit.  She is able to apply new pouch and roll closed.  We discussed showering once cleared by surgery and changing pouches twice weekly. Emptying when 1/3 full stool  She has 4 pouch sets in the room for discharge and is enrolled in secure start.  Enrolled patient in [place] Start Discharge program: Yes [place] team will follow.  Anticipates discharge Friday.  Domenic Moras MSN, RN, FNP-BC CWON Wound, Ostomy, Continence Nurse Pager [phone]

## 2019-06-04 LAB — BASIC METABOLIC PANEL
Anion gap: 10 (ref 5–15)
BUN: 16 mg/dL (ref 8–23)
CO2: 25 mmol/L (ref 22–32)
Calcium: 8.7 mg/dL — ABNORMAL LOW (ref 8.9–10.3)
Chloride: 106 mmol/L (ref 98–111)
Creatinine, Ser: 0.98 mg/dL (ref 0.44–1.00)
GFR calc Af Amer: >60 mL/min (ref >60)
GFR calc non Af Amer: 58 mL/min — ABNORMAL LOW (ref >60)
Glucose, Bld: 112 mg/dL — ABNORMAL HIGH (ref 70–99)
Potassium: 4.7 mmol/L (ref 3.5–5.1)
Sodium: 141 mmol/L (ref 135–145)

## 2019-06-04 LAB — SURGICAL PATHOLOGY

## 2019-06-04 MED ORDER — PSYLLIUM 95 % PO PACK: 1.0000 | PACK | Freq: Every day | ORAL | 1 refills | Status: DC

## 2019-06-04 MED ORDER — TRAMADOL HCL 50 MG PO TABS: 50.0000 mg | ORAL_TABLET | Freq: Four times a day (QID) | ORAL | 0 refills | Status: AC | PRN

## 2019-06-04 MED ORDER — LOPERAMIDE HCL 2 MG PO CAPS: 2.0000 mg | ORAL_CAPSULE | Freq: Two times a day (BID) | ORAL | 1 refills | Status: AC

## 2019-06-04 NOTE — TOC Transition Note (Signed)
Transition of Care [facility]) - CM/SW Discharge Note   Patient Details  Name: Donna [name] MRN: [id number] Date of Birth: 10-05-1945  Transition of Care [facility]) CM/SW Contact:  Lia Hopping, LCSW Phone Number: 06/04/2019, 9:43 AM   Clinical Narrative:    CSW notified [facility] the patient will discharge today. CSW answered patient questions about HHRN. HH contact information written on AVS.  No other needs identified.    Final next level of care: [place] Barriers to Discharge: Continued Medical Work up   Patient Goals and CMS Choice Patient states their goals for this hospitalization and ongoing recovery are:: hopes to be home within the next day CMS Medicare.gov Compare Post Acute Care list provided to:: Patient Choice offered to / list presented to : Patient  Discharge Placement                       Discharge Plan and Services                DME Arranged: N/A DME Agency: NA       HH Arranged: RN [place] Agency: [facility] (now [facility]) Date [place]: 06/01/19 Time [place]: 1400 Representative spoke with at [place]: [place] ([place]) Interventions     Readmission Risk Interventions No flowsheet data found.

## 2019-06-04 NOTE — Progress Notes (Signed)
Discharged patient with instructions for home.  Answered patient's questions.  Patient was taken in wheelchair to main entrance.

## 2019-06-04 NOTE — Discharge Instructions (Signed)
POST OP INSTRUCTIONS AFTER COLON SURGERY  1. DIET: Be sure to include lots of fluids daily to stay hydrated - 64oz of water per day (8, 8 oz glasses).  Avoid fast food or heavy meals for the first couple of weeks as your are more likely to get nauseated. Avoid raw/uncooked fruits or vegetables for the first 4 weeks (its ok to have these if they are blended into smoothie form). If you have fruits/vegetables, make sure they are cooked until soft enough to mash on the roof of your mouth and chew your food well. Otherwise, diet as tolerated.  2. Take your usually prescribed home medications unless otherwise directed.  3. PAIN CONTROL: a. Pain is best controlled by a usual combination of three different methods TOGETHER: i. Ice/Heat ii. Over the counter pain medication iii. Prescription pain medication b. Most patients will experience some swelling and bruising around the surgical site.  Ice packs or heating pads (30-60 minutes up to 6 times a day) will help. Some people prefer to use ice alone, heat alone, alternating between ice & heat.  Experiment to what works for you.  Swelling and bruising can take several weeks to resolve.   c. It is helpful to take an over-the-counter pain medication regularly for the first few weeks: i. Ibuprofen (Motrin/Advil) - 227m tabs - take 3 tabs (6064m every 6 hours as needed for pain (unless you have been directed previously to avoid NSAIDs/ibuprofen) ii. Acetaminophen (Tylenol) - you may take 6509mvery 6 hours as needed. You can take this with motrin as they act differently on the body. If you are taking a narcotic pain medication that has acetaminophen in it, do not take over the counter tylenol at the same time. iii. NOTE: You may take both of these medications together - most patients  find it most helpful when alternating between the two (i.e. Ibuprofen at 6am, tylenol at 9am, ibuprofen at 12pm ...) d. A  prescription for pain medication should be given to you  upon discharge.  Take your pain medication as prescribed if your pain is not adequatly controlled with the over-the-counter pain reliefs mentioned above.  4. Ileostomy care: As instructed in house by the wound ostomy nursing team. Empty/record the output and total the 24 hour amount each morning. If over the last 24 hours, your output is >1.2L (1200 mL) please let us [place]. Continue taking metamucil once daily (prescribed) and imodium 1 tablet in AM and 1 tablet in PM. If output is >1.2L/24 hrs, plan will be to increase imodium to 1 tablet three times daily but let us [place] over the phone.    5. Dressing: Your incisions are covered in Dermabond which is like sterile superglue for the skin. This will come off on it's own in a couple weeks. It is waterproof and you may bathe normally starting the day after your surgery in a shower. Avoid baths/pools/lakes/oceans until your wounds have fully healed.  6. ACTIVITIES as tolerated:   a. Avoid heavy lifting (>10lbs or 1 gallon of milk) for the next 6 weeks. b. You may resume regular daily activities as tolerated--such as daily self-care, walking, climbing stairs--gradually increasing activities as tolerated.  If you can walk 30 minutes without difficulty, it is safe to try more intense activity such as jogging, treadmill, bicycling, low-impact aerobics.  c. DO NOT PUSH THROUGH PAIN.  Let pain be your guide: If it hurts to do something, don't do it. d. [address] when you are no  longer taking prescription pain medication, you can comfortably wear a seatbelt, and you can safely maneuver your car and apply brakes.  7. FOLLOW UP in our office a. Please call [facility] at [phone] to set up an appointment to see your surgeon in the office for a follow-up appointment approximately 2 weeks after your surgery. b. Make sure that you call for this appointment the day you arrive home to insure a convenient appointment time.  9. If you have disability or family leave  forms that need to be completed, you may have them completed by your primary care physician's office; for return to work instructions, please ask our office staff and they will be happy to assist you in obtaining this documentation   When to call us [phone]: 1. Poor pain control 2. Reactions / problems with new medications (rash/itching, etc)  3. Fever over 101.5 F (38.5 C) 4. Inability to urinate 5. Nausea/vomiting 6. Worsening swelling or bruising 7. Continued bleeding from incision. 8. Increased pain, redness, or drainage from the incision  The clinic staff is available to answer your questions during regular business hours (8:30am-5pm).  Please don't hesitate to call and ask to speak to one of our nurses for clinical concerns.   A surgeon from [facility] is always on call at the hospitals   If you have a medical emergency, go to the nearest emergency room or call 911.  [facility], [place] [address] [place], [place], [place]  [address] MAIN: [phone] FAX: [phone] [email or web address]

## 2019-06-04 NOTE — Discharge Summary (Signed)
Patient ID: Donna [name] MRN: [id number] DOB/AGE: 1945/09/12 74 y.o.  Admit date: 05/29/2019 Discharge date: 06/04/2019  Discharge Diagnoses Patient Active Problem List   Diagnosis Date Noted  . S/P total colectomy 05/29/2019  . Primary osteoarthritis of left knee 06/20/2018  . Essential hypertension 01/21/2015  . GERD (gastroesophageal reflux disease) 01/21/2015  . Degeneration of lumbar or lumbosacral intervertebral disc 01/21/2015  . Hyperlipidemia 01/21/2015  . [name] ([place]) 01/13/2015  . Bilateral carpal tunnel syndrome 01/05/2015   Procedures OR 05/29/19: PROCEDURE: 1. Robotic total proctocolectomy with end ileostomy 2. Lysis of adhesions x 30 minutes 3. Primary repair of incisional hernia  Hospital Course: She was admitted postoperative where she recovered well. Her diet was advanced and ileostomy began to be productive. She was ambulating well, foley removed and pain controlled. She remained in house while we worked on getting her ostomy output down and she underwent teaching sessions. On 06/04/19, she was functioning independently with managing her stoma, emptying/recording and changing the appliance. Her pain was well controlled, tolerating diet, normal vitals and benign exam. She was deemed stable for discharge home. Home health in place as well to assist in transition home.   Allergies as of 06/04/2019      Reactions   Prilosec [omeprazole]    Made knees hurt and made me sick   Statins Hives, Other (See Comments)   Muscle spasms      Medication List    TAKE these medications   acetaminophen 500 MG tablet Commonly known as: TYLENOL Take 500 mg by mouth every 6 (six) hours as needed for mild pain or headache.   amLODipine 10 MG tablet Commonly known as: NORVASC Take 10 mg by mouth daily.   benazepril 40 MG tablet Commonly known as: LOTENSIN Take 40 mg by mouth at bedtime.   Calcium 150 MG Tabs Take 150 mg by mouth in the morning and at bedtime.    carvedilol 3.125 MG tablet Commonly known as: COREG Take 3.125 mg by mouth in the morning and at bedtime.   hydrochlorothiazide 25 MG tablet Commonly known as: HYDRODIURIL Take 25 mg by mouth daily.   latanoprost 0.005 % ophthalmic solution Commonly known as: XALATAN Place 1 drop into both eyes at bedtime.   loperamide 2 MG capsule Commonly known as: IMODIUM Take 1 capsule (2 mg total) by mouth 2 (two) times daily.   multivitamin with minerals Tabs tablet Take 1 tablet by mouth in the morning and at bedtime. Woman's one a day   Muscle Rub 10-15 % Crea Apply 1 application topically 2 (two) times daily as needed for muscle pain.   pantoprazole 40 MG tablet Commonly known as: PROTONIX Take 40 mg by mouth 2 (two) times daily before a meal.   psyllium 95 % Pack Commonly known as: HYDROCIL/METAMUCIL Take 1 packet by mouth daily.   Repatha SureClick 419 MG/ML Soaj Generic drug: Evolocumab Inject 140 mg into the skin every 14 (fourteen) days.   traMADol 50 MG tablet Commonly known as: ULTRAM Take 1 tablet (50 mg total) by mouth every 6 (six) hours as needed for up to 5 days (severe pain not controlled with tylenol and ibuprofen). What changed:   when to take this  reasons to take this        Follow-up Information    [facility]   Specialty: [place] Why: to provide home health nursing visits  Contact information: [address] [place] [address] [phone]  Sharon Mt. Dema Severin, M.D. [facility]

## 2019-06-07 DIAGNOSIS — H409 Unspecified glaucoma: Secondary | ICD-10-CM | POA: Diagnosis not present

## 2019-06-07 DIAGNOSIS — M5137 Other intervertebral disc degeneration, lumbosacral region: Secondary | ICD-10-CM | POA: Diagnosis not present

## 2019-06-07 DIAGNOSIS — K5190 Ulcerative colitis, unspecified, without complications: Secondary | ICD-10-CM | POA: Diagnosis not present

## 2019-06-07 DIAGNOSIS — G5603 Carpal tunnel syndrome, bilateral upper limbs: Secondary | ICD-10-CM | POA: Diagnosis not present

## 2019-06-07 DIAGNOSIS — M1991 Primary osteoarthritis, unspecified site: Secondary | ICD-10-CM | POA: Diagnosis not present

## 2019-06-07 DIAGNOSIS — I10 Essential (primary) hypertension: Secondary | ICD-10-CM | POA: Diagnosis not present

## 2019-06-07 DIAGNOSIS — K219 Gastro-esophageal reflux disease without esophagitis: Secondary | ICD-10-CM | POA: Diagnosis not present

## 2019-06-07 DIAGNOSIS — C189 Malignant neoplasm of colon, unspecified: Secondary | ICD-10-CM | POA: Diagnosis not present

## 2019-06-07 DIAGNOSIS — Z432 Encounter for attention to ileostomy: Secondary | ICD-10-CM | POA: Diagnosis not present

## 2019-06-08 ENCOUNTER — Other Ambulatory Visit: Payer: Self-pay

## 2019-06-08 NOTE — Patient Outreach (Signed)
[facility]) Care Management  06/08/2019  Donna [name] [id number] [birth year] [id number]   EMMI- General Discharge RED ON EMMI ALERT Day #  1 Date: 06/08/19 Red Alert Reason: Know who to call about changes in condition? No  Outreach attempt: Telephone call to patient.  She reports she is doing good she reports she continues to get adjusted to ostomy. She states that home health has been twice and due to come again today.  Discussed red alert.  Patient reports that was a mistake and knows if she has concerns to call Dr. Dema Severin who did her surgery.   Advised patient that she is due for one more call and that if it flags RN CM will follow up with a phone call.  She verbalized understanding. Patient declines any further [facility] follow up at this time.    Plan: RN CM will close case.    Jone Baseman, RN, MSN [facility] Care Management Care Management Coordinator Direct Line [phone] Toll Free: [phone]  Fax: [phone]

## 2019-06-09 DIAGNOSIS — H409 Unspecified glaucoma: Secondary | ICD-10-CM | POA: Diagnosis not present

## 2019-06-09 DIAGNOSIS — K5190 Ulcerative colitis, unspecified, without complications: Secondary | ICD-10-CM | POA: Diagnosis not present

## 2019-06-09 DIAGNOSIS — M1991 Primary osteoarthritis, unspecified site: Secondary | ICD-10-CM | POA: Diagnosis not present

## 2019-06-09 DIAGNOSIS — M5137 Other intervertebral disc degeneration, lumbosacral region: Secondary | ICD-10-CM | POA: Diagnosis not present

## 2019-06-09 DIAGNOSIS — C189 Malignant neoplasm of colon, unspecified: Secondary | ICD-10-CM | POA: Diagnosis not present

## 2019-06-09 DIAGNOSIS — I10 Essential (primary) hypertension: Secondary | ICD-10-CM | POA: Diagnosis not present

## 2019-06-09 DIAGNOSIS — G5603 Carpal tunnel syndrome, bilateral upper limbs: Secondary | ICD-10-CM | POA: Diagnosis not present

## 2019-06-09 DIAGNOSIS — Z432 Encounter for attention to ileostomy: Secondary | ICD-10-CM | POA: Diagnosis not present

## 2019-06-09 DIAGNOSIS — K219 Gastro-esophageal reflux disease without esophagitis: Secondary | ICD-10-CM | POA: Diagnosis not present

## 2019-06-16 MED FILL — REPATHA SURECLICK 140 MG/ML: 140 | 28 days supply | Qty: 2 | Fill #0

## 2019-06-17 DIAGNOSIS — K219 Gastro-esophageal reflux disease without esophagitis: Secondary | ICD-10-CM | POA: Diagnosis not present

## 2019-06-17 DIAGNOSIS — H409 Unspecified glaucoma: Secondary | ICD-10-CM | POA: Diagnosis not present

## 2019-06-17 DIAGNOSIS — Z432 Encounter for attention to ileostomy: Secondary | ICD-10-CM | POA: Diagnosis not present

## 2019-06-17 DIAGNOSIS — M1991 Primary osteoarthritis, unspecified site: Secondary | ICD-10-CM | POA: Diagnosis not present

## 2019-06-17 DIAGNOSIS — K5190 Ulcerative colitis, unspecified, without complications: Secondary | ICD-10-CM | POA: Diagnosis not present

## 2019-06-17 DIAGNOSIS — G5603 Carpal tunnel syndrome, bilateral upper limbs: Secondary | ICD-10-CM | POA: Diagnosis not present

## 2019-06-17 DIAGNOSIS — M5137 Other intervertebral disc degeneration, lumbosacral region: Secondary | ICD-10-CM | POA: Diagnosis not present

## 2019-06-17 DIAGNOSIS — I10 Essential (primary) hypertension: Secondary | ICD-10-CM | POA: Diagnosis not present

## 2019-06-17 DIAGNOSIS — C189 Malignant neoplasm of colon, unspecified: Secondary | ICD-10-CM | POA: Diagnosis not present

## 2019-06-19 DIAGNOSIS — M1991 Primary osteoarthritis, unspecified site: Secondary | ICD-10-CM | POA: Diagnosis not present

## 2019-06-19 DIAGNOSIS — K5190 Ulcerative colitis, unspecified, without complications: Secondary | ICD-10-CM | POA: Diagnosis not present

## 2019-06-19 DIAGNOSIS — C189 Malignant neoplasm of colon, unspecified: Secondary | ICD-10-CM | POA: Diagnosis not present

## 2019-06-19 DIAGNOSIS — H409 Unspecified glaucoma: Secondary | ICD-10-CM | POA: Diagnosis not present

## 2019-06-19 DIAGNOSIS — Z432 Encounter for attention to ileostomy: Secondary | ICD-10-CM | POA: Diagnosis not present

## 2019-06-19 DIAGNOSIS — G5603 Carpal tunnel syndrome, bilateral upper limbs: Secondary | ICD-10-CM | POA: Diagnosis not present

## 2019-06-19 DIAGNOSIS — K219 Gastro-esophageal reflux disease without esophagitis: Secondary | ICD-10-CM | POA: Diagnosis not present

## 2019-06-19 DIAGNOSIS — I10 Essential (primary) hypertension: Secondary | ICD-10-CM | POA: Diagnosis not present

## 2019-06-19 DIAGNOSIS — M5137 Other intervertebral disc degeneration, lumbosacral region: Secondary | ICD-10-CM | POA: Diagnosis not present

## 2019-06-23 DIAGNOSIS — G5603 Carpal tunnel syndrome, bilateral upper limbs: Secondary | ICD-10-CM | POA: Diagnosis not present

## 2019-06-23 DIAGNOSIS — M5137 Other intervertebral disc degeneration, lumbosacral region: Secondary | ICD-10-CM | POA: Diagnosis not present

## 2019-06-23 DIAGNOSIS — C189 Malignant neoplasm of colon, unspecified: Secondary | ICD-10-CM | POA: Diagnosis not present

## 2019-06-23 DIAGNOSIS — K5190 Ulcerative colitis, unspecified, without complications: Secondary | ICD-10-CM | POA: Diagnosis not present

## 2019-06-23 DIAGNOSIS — M1991 Primary osteoarthritis, unspecified site: Secondary | ICD-10-CM | POA: Diagnosis not present

## 2019-06-23 DIAGNOSIS — H409 Unspecified glaucoma: Secondary | ICD-10-CM | POA: Diagnosis not present

## 2019-06-23 DIAGNOSIS — I10 Essential (primary) hypertension: Secondary | ICD-10-CM | POA: Diagnosis not present

## 2019-06-23 DIAGNOSIS — K219 Gastro-esophageal reflux disease without esophagitis: Secondary | ICD-10-CM | POA: Diagnosis not present

## 2019-06-23 DIAGNOSIS — Z432 Encounter for attention to ileostomy: Secondary | ICD-10-CM | POA: Diagnosis not present

## 2019-06-25 DIAGNOSIS — Z96652 Presence of left artificial knee joint: Secondary | ICD-10-CM | POA: Diagnosis not present

## 2019-06-25 DIAGNOSIS — K5190 Ulcerative colitis, unspecified, without complications: Secondary | ICD-10-CM | POA: Diagnosis not present

## 2019-06-25 DIAGNOSIS — C189 Malignant neoplasm of colon, unspecified: Secondary | ICD-10-CM | POA: Diagnosis not present

## 2019-06-25 DIAGNOSIS — M5442 Lumbago with sciatica, left side: Secondary | ICD-10-CM | POA: Diagnosis not present

## 2019-06-25 DIAGNOSIS — I10 Essential (primary) hypertension: Secondary | ICD-10-CM | POA: Diagnosis not present

## 2019-06-25 DIAGNOSIS — K219 Gastro-esophageal reflux disease without esophagitis: Secondary | ICD-10-CM | POA: Diagnosis not present

## 2019-06-25 DIAGNOSIS — M25562 Pain in left knee: Secondary | ICD-10-CM | POA: Diagnosis not present

## 2019-06-25 DIAGNOSIS — Z432 Encounter for attention to ileostomy: Secondary | ICD-10-CM | POA: Diagnosis not present

## 2019-06-25 DIAGNOSIS — M5137 Other intervertebral disc degeneration, lumbosacral region: Secondary | ICD-10-CM | POA: Diagnosis not present

## 2019-06-25 DIAGNOSIS — G5603 Carpal tunnel syndrome, bilateral upper limbs: Secondary | ICD-10-CM | POA: Diagnosis not present

## 2019-06-25 DIAGNOSIS — M545 Low back pain: Secondary | ICD-10-CM | POA: Diagnosis not present

## 2019-06-25 DIAGNOSIS — H409 Unspecified glaucoma: Secondary | ICD-10-CM | POA: Diagnosis not present

## 2019-06-25 DIAGNOSIS — M5441 Lumbago with sciatica, right side: Secondary | ICD-10-CM | POA: Diagnosis not present

## 2019-06-25 DIAGNOSIS — M1991 Primary osteoarthritis, unspecified site: Secondary | ICD-10-CM | POA: Diagnosis not present

## 2019-07-01 DIAGNOSIS — H409 Unspecified glaucoma: Secondary | ICD-10-CM | POA: Diagnosis not present

## 2019-07-01 DIAGNOSIS — G5603 Carpal tunnel syndrome, bilateral upper limbs: Secondary | ICD-10-CM | POA: Diagnosis not present

## 2019-07-01 DIAGNOSIS — K5190 Ulcerative colitis, unspecified, without complications: Secondary | ICD-10-CM | POA: Diagnosis not present

## 2019-07-01 DIAGNOSIS — M1991 Primary osteoarthritis, unspecified site: Secondary | ICD-10-CM | POA: Diagnosis not present

## 2019-07-01 DIAGNOSIS — K219 Gastro-esophageal reflux disease without esophagitis: Secondary | ICD-10-CM | POA: Diagnosis not present

## 2019-07-01 DIAGNOSIS — M5137 Other intervertebral disc degeneration, lumbosacral region: Secondary | ICD-10-CM | POA: Diagnosis not present

## 2019-07-01 DIAGNOSIS — C189 Malignant neoplasm of colon, unspecified: Secondary | ICD-10-CM | POA: Diagnosis not present

## 2019-07-01 DIAGNOSIS — I10 Essential (primary) hypertension: Secondary | ICD-10-CM | POA: Diagnosis not present

## 2019-07-01 DIAGNOSIS — Z432 Encounter for attention to ileostomy: Secondary | ICD-10-CM | POA: Diagnosis not present

## 2019-07-07 DIAGNOSIS — Z432 Encounter for attention to ileostomy: Secondary | ICD-10-CM | POA: Diagnosis not present

## 2019-07-07 DIAGNOSIS — C189 Malignant neoplasm of colon, unspecified: Secondary | ICD-10-CM | POA: Diagnosis not present

## 2019-07-07 DIAGNOSIS — K219 Gastro-esophageal reflux disease without esophagitis: Secondary | ICD-10-CM | POA: Diagnosis not present

## 2019-07-07 DIAGNOSIS — K5190 Ulcerative colitis, unspecified, without complications: Secondary | ICD-10-CM | POA: Diagnosis not present

## 2019-07-07 DIAGNOSIS — H409 Unspecified glaucoma: Secondary | ICD-10-CM | POA: Diagnosis not present

## 2019-07-07 DIAGNOSIS — I10 Essential (primary) hypertension: Secondary | ICD-10-CM | POA: Diagnosis not present

## 2019-07-07 DIAGNOSIS — M5137 Other intervertebral disc degeneration, lumbosacral region: Secondary | ICD-10-CM | POA: Diagnosis not present

## 2019-07-07 DIAGNOSIS — M1991 Primary osteoarthritis, unspecified site: Secondary | ICD-10-CM | POA: Diagnosis not present

## 2019-07-07 DIAGNOSIS — G5603 Carpal tunnel syndrome, bilateral upper limbs: Secondary | ICD-10-CM | POA: Diagnosis not present

## 2019-07-08 DIAGNOSIS — Z432 Encounter for attention to ileostomy: Secondary | ICD-10-CM | POA: Diagnosis not present

## 2019-07-08 DIAGNOSIS — C189 Malignant neoplasm of colon, unspecified: Secondary | ICD-10-CM | POA: Diagnosis not present

## 2019-07-08 DIAGNOSIS — M5137 Other intervertebral disc degeneration, lumbosacral region: Secondary | ICD-10-CM | POA: Diagnosis not present

## 2019-07-08 DIAGNOSIS — G5603 Carpal tunnel syndrome, bilateral upper limbs: Secondary | ICD-10-CM | POA: Diagnosis not present

## 2019-07-08 DIAGNOSIS — K5190 Ulcerative colitis, unspecified, without complications: Secondary | ICD-10-CM | POA: Diagnosis not present

## 2019-07-08 DIAGNOSIS — K219 Gastro-esophageal reflux disease without esophagitis: Secondary | ICD-10-CM | POA: Diagnosis not present

## 2019-07-08 DIAGNOSIS — H409 Unspecified glaucoma: Secondary | ICD-10-CM | POA: Diagnosis not present

## 2019-07-08 DIAGNOSIS — I10 Essential (primary) hypertension: Secondary | ICD-10-CM | POA: Diagnosis not present

## 2019-07-08 DIAGNOSIS — M1991 Primary osteoarthritis, unspecified site: Secondary | ICD-10-CM | POA: Diagnosis not present

## 2019-07-14 DIAGNOSIS — C189 Malignant neoplasm of colon, unspecified: Secondary | ICD-10-CM | POA: Diagnosis not present

## 2019-07-14 DIAGNOSIS — Z432 Encounter for attention to ileostomy: Secondary | ICD-10-CM | POA: Diagnosis not present

## 2019-07-14 DIAGNOSIS — K5190 Ulcerative colitis, unspecified, without complications: Secondary | ICD-10-CM | POA: Diagnosis not present

## 2019-07-14 DIAGNOSIS — I10 Essential (primary) hypertension: Secondary | ICD-10-CM | POA: Diagnosis not present

## 2019-07-15 DIAGNOSIS — Z432 Encounter for attention to ileostomy: Secondary | ICD-10-CM | POA: Diagnosis not present

## 2019-07-15 DIAGNOSIS — M1991 Primary osteoarthritis, unspecified site: Secondary | ICD-10-CM | POA: Diagnosis not present

## 2019-07-15 DIAGNOSIS — I10 Essential (primary) hypertension: Secondary | ICD-10-CM | POA: Diagnosis not present

## 2019-07-15 DIAGNOSIS — M5137 Other intervertebral disc degeneration, lumbosacral region: Secondary | ICD-10-CM | POA: Diagnosis not present

## 2019-07-15 DIAGNOSIS — C189 Malignant neoplasm of colon, unspecified: Secondary | ICD-10-CM | POA: Diagnosis not present

## 2019-07-15 DIAGNOSIS — H409 Unspecified glaucoma: Secondary | ICD-10-CM | POA: Diagnosis not present

## 2019-07-15 DIAGNOSIS — G5603 Carpal tunnel syndrome, bilateral upper limbs: Secondary | ICD-10-CM | POA: Diagnosis not present

## 2019-07-15 DIAGNOSIS — K219 Gastro-esophageal reflux disease without esophagitis: Secondary | ICD-10-CM | POA: Diagnosis not present

## 2019-07-15 DIAGNOSIS — K5190 Ulcerative colitis, unspecified, without complications: Secondary | ICD-10-CM | POA: Diagnosis not present

## 2019-07-16 MED FILL — REPATHA SURECLICK 140 MG/ML: 140 | 28 days supply | Qty: 2 | Fill #1

## 2019-07-21 ENCOUNTER — Ambulatory Visit
Admission: RE | Admit: 2019-07-21 | Discharge: 2019-07-21 | Disposition: A | Discharge status: Home / Self Care | Payer: Medicare HMO | Source: Ambulatory Visit | Attending: Internal Medicine | Admitting: Internal Medicine

## 2019-07-21 ENCOUNTER — Other Ambulatory Visit: Payer: Self-pay

## 2019-07-21 DIAGNOSIS — Z1231 Encounter for screening mammogram for malignant neoplasm of breast: Secondary | ICD-10-CM | POA: Diagnosis not present

## 2019-07-21 DIAGNOSIS — M5416 Radiculopathy, lumbar region: Secondary | ICD-10-CM | POA: Diagnosis not present

## 2019-07-22 ENCOUNTER — Other Ambulatory Visit: Payer: Self-pay

## 2019-07-22 DIAGNOSIS — H409 Unspecified glaucoma: Secondary | ICD-10-CM | POA: Diagnosis not present

## 2019-07-22 DIAGNOSIS — G5603 Carpal tunnel syndrome, bilateral upper limbs: Secondary | ICD-10-CM | POA: Diagnosis not present

## 2019-07-22 DIAGNOSIS — M1991 Primary osteoarthritis, unspecified site: Secondary | ICD-10-CM | POA: Diagnosis not present

## 2019-07-22 DIAGNOSIS — M5137 Other intervertebral disc degeneration, lumbosacral region: Secondary | ICD-10-CM | POA: Diagnosis not present

## 2019-07-22 DIAGNOSIS — K5190 Ulcerative colitis, unspecified, without complications: Secondary | ICD-10-CM | POA: Diagnosis not present

## 2019-07-22 DIAGNOSIS — K219 Gastro-esophageal reflux disease without esophagitis: Secondary | ICD-10-CM | POA: Diagnosis not present

## 2019-07-22 DIAGNOSIS — Z432 Encounter for attention to ileostomy: Secondary | ICD-10-CM | POA: Diagnosis not present

## 2019-07-22 DIAGNOSIS — I10 Essential (primary) hypertension: Secondary | ICD-10-CM | POA: Diagnosis not present

## 2019-07-22 DIAGNOSIS — C189 Malignant neoplasm of colon, unspecified: Secondary | ICD-10-CM | POA: Diagnosis not present

## 2019-07-22 NOTE — Patient Outreach (Signed)
Aging Gracefully Program  07/22/2019  Donna [name] [birth year] [id number]  [facility] Evaluation Interviewer attempted to call patient on today regarding Aging Gracefully referral. No answer from patient after multiple rings.  Will attempt to call back later this week.  [place] Management Assistant [phone]

## 2019-07-23 ENCOUNTER — Ambulatory Visit: Payer: Self-pay

## 2019-07-23 ENCOUNTER — Other Ambulatory Visit: Payer: Self-pay

## 2019-07-23 NOTE — Patient Outreach (Signed)
Aging Gracefully Program  07/23/2019  Ennifer Harston Mandt Jan 11, 1945 [id number]  [facility] Evaluation Interviewer made contact with patient. Aging Gracefully survey completed.   Interviewer will send referral to Tomasa Rand, RN and OT for follow up.  [place] Management Assistant [phone]

## 2019-07-29 DIAGNOSIS — I10 Essential (primary) hypertension: Secondary | ICD-10-CM | POA: Diagnosis not present

## 2019-07-29 DIAGNOSIS — H409 Unspecified glaucoma: Secondary | ICD-10-CM | POA: Diagnosis not present

## 2019-07-29 DIAGNOSIS — Z432 Encounter for attention to ileostomy: Secondary | ICD-10-CM | POA: Diagnosis not present

## 2019-07-29 DIAGNOSIS — K219 Gastro-esophageal reflux disease without esophagitis: Secondary | ICD-10-CM | POA: Diagnosis not present

## 2019-07-29 DIAGNOSIS — K5190 Ulcerative colitis, unspecified, without complications: Secondary | ICD-10-CM | POA: Diagnosis not present

## 2019-07-29 DIAGNOSIS — M5137 Other intervertebral disc degeneration, lumbosacral region: Secondary | ICD-10-CM | POA: Diagnosis not present

## 2019-07-29 DIAGNOSIS — M1991 Primary osteoarthritis, unspecified site: Secondary | ICD-10-CM | POA: Diagnosis not present

## 2019-07-29 DIAGNOSIS — G5603 Carpal tunnel syndrome, bilateral upper limbs: Secondary | ICD-10-CM | POA: Diagnosis not present

## 2019-07-29 DIAGNOSIS — C189 Malignant neoplasm of colon, unspecified: Secondary | ICD-10-CM | POA: Diagnosis not present

## 2019-08-05 DIAGNOSIS — M5137 Other intervertebral disc degeneration, lumbosacral region: Secondary | ICD-10-CM | POA: Diagnosis not present

## 2019-08-05 DIAGNOSIS — K219 Gastro-esophageal reflux disease without esophagitis: Secondary | ICD-10-CM | POA: Diagnosis not present

## 2019-08-05 DIAGNOSIS — Z432 Encounter for attention to ileostomy: Secondary | ICD-10-CM | POA: Diagnosis not present

## 2019-08-05 DIAGNOSIS — H409 Unspecified glaucoma: Secondary | ICD-10-CM | POA: Diagnosis not present

## 2019-08-05 DIAGNOSIS — I10 Essential (primary) hypertension: Secondary | ICD-10-CM | POA: Diagnosis not present

## 2019-08-05 DIAGNOSIS — K5190 Ulcerative colitis, unspecified, without complications: Secondary | ICD-10-CM | POA: Diagnosis not present

## 2019-08-05 DIAGNOSIS — M1991 Primary osteoarthritis, unspecified site: Secondary | ICD-10-CM | POA: Diagnosis not present

## 2019-08-05 DIAGNOSIS — C189 Malignant neoplasm of colon, unspecified: Secondary | ICD-10-CM | POA: Diagnosis not present

## 2019-08-05 DIAGNOSIS — G5603 Carpal tunnel syndrome, bilateral upper limbs: Secondary | ICD-10-CM | POA: Diagnosis not present

## 2019-08-06 DIAGNOSIS — M5416 Radiculopathy, lumbar region: Secondary | ICD-10-CM | POA: Diagnosis not present

## 2019-08-13 DIAGNOSIS — G720 Drug-induced myopathy: Secondary | ICD-10-CM | POA: Diagnosis not present

## 2019-08-13 DIAGNOSIS — T466X5A Adverse effect of antihyperlipidemic and antiarteriosclerotic drugs, initial encounter: Secondary | ICD-10-CM | POA: Diagnosis not present

## 2019-08-13 DIAGNOSIS — D649 Anemia, unspecified: Secondary | ICD-10-CM | POA: Diagnosis not present

## 2019-08-13 DIAGNOSIS — E7800 Pure hypercholesterolemia, unspecified: Secondary | ICD-10-CM | POA: Diagnosis not present

## 2019-08-13 DIAGNOSIS — N182 Chronic kidney disease, stage 2 (mild): Secondary | ICD-10-CM | POA: Diagnosis not present

## 2019-08-13 DIAGNOSIS — Z79899 Other long term (current) drug therapy: Secondary | ICD-10-CM | POA: Diagnosis not present

## 2019-08-13 DIAGNOSIS — I10 Essential (primary) hypertension: Secondary | ICD-10-CM | POA: Diagnosis not present

## 2019-08-13 DIAGNOSIS — R5383 Other fatigue: Secondary | ICD-10-CM | POA: Diagnosis not present

## 2019-08-13 MED FILL — REPATHA SURECLICK 140 MG/ML: 140 | 28 days supply | Qty: 2 | Fill #2

## 2019-08-14 ENCOUNTER — Other Ambulatory Visit: Payer: Self-pay | Admitting: Specialist

## 2019-08-14 ENCOUNTER — Other Ambulatory Visit: Payer: Self-pay

## 2019-08-16 NOTE — Patient Outreach (Signed)
Aging Gracefully Program  OT Initial Visit  08/16/2019  Donna [name] [facility] 1945/05/23 [id number]  Visit:  1- Initial Visit  Start Time:  0815 End Time:  0900 Total Minutes:  70  CCAP: Typical Daily Routine: Typical Daily Routine:: son lives with patient, he works full time and is not home too much.  patient cares for her grandchildren during the day and when school starts will pick them up from school in the afternoon.  patient  drives and is active in her church What Types Of Care Problems Are You Having Throughout The Day?: some difficulty with care of ostomy What Kind Of Help Do You Receive?: minimal Do You Think You Need Other Types Of Help?: n/a What Do You Think Would Make Everyday Life Easier For You?: a walk in shower, railing going into laundry room What Is A Good Day Like?: not too much pain in her knee What Is A Bad Day Like?: increased pain in her knee back and ostomy site Do You Have Time For Yourself?: yes  Patient Reported Equipment: Patient Reported Equipment Currently Used: Raised Toilet Seat, Engineer, petroleum Mobility-Walking Indoors/Getting Around the [employer]:   Functional Mobility-Walk A Block: Walk A Block: Unable To Do Functional Mobility-Maintain Balance While Showering: Maintaining Balance While Showering: A Little Difficulty Do You:: No Device/No Assistance Importance Of Learning New Strategies:: Very Much Observation: Maintain Balance While Showering: Supervision Safety: A Little Risk Efficiency: Somewhat Intervention: Yes Functional Mobility-Stooping, Crouching, Kneeling To Retreive Item: Stooping, Crouching, or Kneeling To Retrieve Item: A Little Difficulty Do You:: No Device/No Assistance Importance Of Learning New Strategies:: A Little Functional Mobility-Bending From Standing Position To Pick Up Clothing Off The Floor: Bending Over From Standing Position To Pick Up Clothing Off The Floor: A Lot Of Difficulty Do You:: No Device/No  Assistance Importance Of Learning New Strategies:: Very Much Observation: Bending Over From Standing Postion To Pick Up Clothing Off Of Floor: Supervision Safety: A Little Risk Efficiency: Somewhat Intervention: Yes Functional Mobility-Reaching For Items Above Shoulder Level: Reaching For Items Above Shoulder Level: No Difficulty Do You:: No Device/No Assistance Importance Of Learning New Strategies:: Not At All Functional Mobility-Climb 1 Flight Of Stairs: Climb 1 Flight Of Stairs: Moderate Difficulty Do You:: No Device/No Assistance Importance Of Learning New Strategies:: Very Much Observation: Climb One Flight Of Stairs: Supervision Safety: A Little Risk Efficiency: Somewhat Intervention: Yes Functional Mobility-Move In And Out Of Chair: Move In and Out Of A Chair: No Difficulty Do You:: No Device/No Assistance Importance Of Learning New Strategies:: Not At All Functional Mobility-Move In And Out Of Bed: Move In and Out Of Bed: No Difficulty Do You:: No Device/No Assistance Importance Of Learning New Strategies:: Not At All Functional Mobility-Move In And Out Of Bath/Shower: Move In And Out Of A Bath/Shower: Moderate Difficulty Do You:: No Device/No Assistance Importance Of Learning New Strategies:: Very Much Observation: Move In And Out Of Bath/Shower: Supervision Safety: A Little Risk Efficiency: Somewhat Intervention: Yes Functional Mobility-Get On And Off Toilet: Getting Up From The Floor: A Lot Of Difficulty Do You:: No Device/No Assistance Importance Of Learning New Strategies:: Very Much Functional Mobility-Into And Out Of Car, Not Including Driving: Into  And Out Of Car, Not Including Driving: No Difficulty Do You:: No Device/No Assistance Importance Of Learning New Strategies:: Not At All Functional Mobility-Other Mobility Difficulty:      Activities of Daily Living-Bathing/Showering: ADL-Bathing/Showering: A Lot of Difficulty Do You:: No Device/No  Assistance Importance Of Learning New Strategies: Very Much  ADL Observation: Bathing/Showering: N/O Intervention: Yes Activities of Daily Living-Personal Hygiene and Grooming: Personal Hygiene and Grooming: No Difficulty Do You:: No Device/No Assistance Importance Of Learning New Strategies: Not At All Activities of Daily Living-Toilet Hygiene: Toilet Hygiene: A Little Difficulty Do You:: No Device/No Assistance Importance Of Learning New Strategies: Moderate Activities of Daily Living-Put On And Take Off Undergarments (Incl. Fasteners): Put On And Take Off Undergarments (Incl. Fasteners): No Difficulty Do You:: No Device/No Assistance Importance Of Learning New Strategies: Not At All Activities of Daily Living-Put On And Take Off Shirt/Dress/Coat (Incl. Fasteners): Put On And Take Off Shirt/Dress/Coat (Incl. Fasteners): No Difficulty Do You:: No Device/No Assistance Importance Of Learning New Strategies: Not At All Activities of Daily Living-Put On And Take Off Socks And Shoes: Put On And Take Off Socks And  Shoes: No Difficulty Do You:: No Device/No Assistance Importance Of Learning New Strategies: Not At All Activities of Daily Living-Feed Self: Feed Self: No Difficulty Do You:: No Device/No Assistance Importance Of Learning New Strategies: Not At All Activities of Daily Living-Rest And Sleep: Rest and Sleep: A Little Difficulty Do You:: No Device/No Assistance Importance Of Learning New Strategies: Moderate Intervention: Yes Activities of Daily Living-Sexual Activity: Sexual  Activity: N/A Activities of Daily Living-Other Activity Identified:    Instrumental Activities of Daily Living-Light Homemaking (Laundry, Straightening Up, Vacuuming):  Do Light Homemaking (Laundry, Straightening Up, Vacuuming): No Difficulty Do You:: No Device/No Assistance Importance Of Learning New Strategies: Not At All Instrumental Activities of Daily Living-Making A Bed: Making a Bed: No  Difficulty Do You:: No Device/No Assistance Importance Of Learning New Strategies: Not At All Instrumental Activities of Daily Living-Washing Dishes By Hand While Standing At The Sink: Washing Dishes By Hand While Standing At The Sink: No Difficulty Do You:: No Device/No Assistance Importance Of Learning New Strategies: Not At All Instrumental Activities of Daily Living-Grocery Shopping: Do Grocery Shopping: No Difficulty Do You:: No Device/No Assistance Importance Of Learning New Strategies: Not At All Instrumental Activities of Daily Living-Use Telephone: Use Telephone: No Difficulty Do You:: No Device/No Assistance Importance Of Learning New Strategies: Not At All Instrumental Activities of Daily Living-Financial Management: Financial Management: No Difficulty Do You:: No Device/No Assistance Importance Of Learning New Strategies: Not At All Instrumental Activities of Daily Living-Medications: Take Medications: No Difficulty Do You:: No Device/No Assistance Importance Of Learning New Strategies: Not At All Instrumental Activities of Daily Living-Health Management And Maintenance: Health Management & Maintenance: No Difficulty Do You:: No Device/No Assistance Importance Of Learning New Strategies: Not At All Instrumental Activities of Daily Living-Meal Preparation and Clean-Up: Meal Preparation and Clean-Up: No Difficulty Do You:: No Device/No Assistance Importance Of Learning New Strategies: Not At All Instrumental Activities of Daily Living-Provide Care For Others/Pets: Care For Others/Pets: N/A Instrumental Activities of Daily Living-Take Part In Organized Social Activities: Take Part In Organized Social Activities: No Difficulty Do You:: No Device/No Assistance Importance Of Learning New Strategies: Not At All Instrumental Activities of Daily Living-Leisure Participation: Leisure Participation: No Difficulty Do You:: No Device/No Assistance Importance Of Learning New  Strategies: Not At All Instrumental Activities of Daily Living-Employment/Volunteer Activities: Employment/Volunteer Activities: N/A Instrumental Activities of Daily Living-Other Identifies:    Readiness To Change Score:  Readiness to Change Score: 10  Home Environment Assessment: Outside Home Entry:: has bilateral grab bars Entryway/Foyer:: throw rugs Dining Room:: throw rugs Living Room:: throw rugs, sofa is low and she is able to get up from it without difficulty Kitchen:: open and all appliances easily  accessible Stairs:: 2 steps to laundry room very steep and in need of a grab bar Bathroom:: half bath in bedroom could use bidet sprayer, hall bath needs a tub to shower conversion as commode is too close to tub for use of ttb.  needs shower seat and grab bars Master Bedroom:: several throw rugs Laundry:: laundry is accessible, however steps are steep and is need of a railing on these steps Basement:: n/a Hallways:: throw rugs Smoke/CO2 Detector:: unsure Veterinary surgeon:: unsure Mailbox:: accessible Other Home Environment Concerns:: ceiling paint is peeling  Durable Medical Equipment:    Patient Education: Education Provided: Yes Education Details: provided home safety guide and educated patient on use of this guide, reviewed planned OT goals with patient Person(s) Educated: Patient Comprehension: Verbalized Understanding  Goals: Goals Addressed            This Visit's Progress   . Patient Stated       Patient will improve sleep hygiene practices.     . Patient Stated       Patient will be able to enter and exit laundry room with increased safety and independence.    . Patient Stated       Patient will improve safety and independence with bathing, tub transfers, and ostomy care.    . Patient Stated       Patient will improve independence and safety with picking items up from the floor.        Post Clinical Reasoning: Clinician View Of Client Situation::  Ms. Clinkenbeard is aware of her needs to improve safety in her home and is open to trying adaptive equipment and home modfications Client View Of His/Her Situation:: doing well, has had significant medical changes recently with addition of ostomy bag and is open to learning tips to make life easier and safer Next Visit Plan:: brainstorm goal 1 - improving sleep hygiene   Vangie Bicker, [place], OTR/L [phone]

## 2019-08-17 DIAGNOSIS — H02831 Dermatochalasis of right upper eyelid: Secondary | ICD-10-CM | POA: Diagnosis not present

## 2019-08-17 DIAGNOSIS — H2513 Age-related nuclear cataract, bilateral: Secondary | ICD-10-CM | POA: Diagnosis not present

## 2019-08-17 DIAGNOSIS — H04123 Dry eye syndrome of bilateral lacrimal glands: Secondary | ICD-10-CM | POA: Diagnosis not present

## 2019-08-17 DIAGNOSIS — H40053 Ocular hypertension, bilateral: Secondary | ICD-10-CM | POA: Diagnosis not present

## 2019-08-17 DIAGNOSIS — H02834 Dermatochalasis of left upper eyelid: Secondary | ICD-10-CM | POA: Diagnosis not present

## 2019-08-21 DIAGNOSIS — K5100 Ulcerative (chronic) pancolitis without complications: Secondary | ICD-10-CM | POA: Diagnosis not present

## 2019-08-21 DIAGNOSIS — K219 Gastro-esophageal reflux disease without esophagitis: Secondary | ICD-10-CM | POA: Diagnosis not present

## 2019-08-26 DIAGNOSIS — Z932 Ileostomy status: Secondary | ICD-10-CM | POA: Diagnosis not present

## 2019-08-26 DIAGNOSIS — K5190 Ulcerative colitis, unspecified, without complications: Secondary | ICD-10-CM | POA: Diagnosis not present

## 2019-08-27 ENCOUNTER — Other Ambulatory Visit: Payer: Self-pay

## 2019-08-27 NOTE — Patient Outreach (Signed)
Aging Gracefully Program  08/27/2019  Amanee Donna [name] [facility] 05-28-1945 [id number]   Care Coordination:  Placed call to patient to schedule home visit. Spoke with patient and offered appointment for 09/02/2019 at 12:00.  Tomasa Rand, RN, BSN, CEN [facility] [employer] [phone]

## 2019-08-28 DIAGNOSIS — K5190 Ulcerative colitis, unspecified, without complications: Secondary | ICD-10-CM | POA: Diagnosis not present

## 2019-08-28 DIAGNOSIS — Z932 Ileostomy status: Secondary | ICD-10-CM | POA: Diagnosis not present

## 2019-09-02 ENCOUNTER — Other Ambulatory Visit: Payer: Self-pay

## 2019-09-04 NOTE — Patient Outreach (Signed)
Aging Gracefully Program  RN Visit  09/04/2019  Donna [name] [facility] 03-Oct-1945 [id number]  Visit:   RN home visit #1  Start Time:   1200 End Time:   1315 Total Minutes:   75 minutes  Readiness To Change Score:  Readiness to Change Score: 10  Universal RN Interventions: Calendar Distribution: No (given by OT) Exercise Review: No Medications: Yes Medication Changes: Yes Mood: Yes Pain: Yes PCP Advocacy/Support: Yes Fall Prevention: Yes Incontinence: Yes Clinician View Of Client Situation: Patient ambulatory in the home without use of any assistive devices. Patient very happy with being apart of the aging gracefully program. Recent surgery and new colostomy for colon cancer. Patient living alone. Strong faith.  Assist with care of her grandkids after school. Client View Of His/Her Situation: Patient reports she has arthritis in her left knee and shoulder. Reports she has learned how to manage her stoma and balances her metamucil and immodium.  Reports MD's are happy with recovery from colon cancer. Denies needing chemo or radiation. Reports she is able to manage her own medications with a pill planner. Denies any recent falls.  Healthcare Provider Communication: Did Higher education careers adviser With [employer] Provider?: No  Clinician View of Client Situation: Clinician View Of Client Situation: Patient ambulatory in the home without use of any assistive devices. Patient very happy with being apart of the aging gracefully program. Recent surgery and new colostomy for colon cancer. Patient living alone. Strong faith.  Assist with care of her grandkids after school. Client's View of His/Her Situation: Client View Of His/Her Situation: Patient reports she has arthritis in her left knee and shoulder. Reports she has learned how to manage her stoma and balances her metamucil and immodium.  Reports MD's are happy with recovery from colon cancer. Denies needing chemo or radiation. Reports she is able  to manage her own medications with a pill planner. Denies any recent falls.  Medication Assessment: Do You Have Any Problems Paying For Medications?: No Where Does Client Store Medications?: Kitchen Table Can Client Read Pill Bottles?: Yes Does Client Use A Pillbox?: No Does Anyone Assist Client In Taking Medications?: No Do You Take Vitamin D?: No Total Number Of Medications That The Client Takes: 12 Does Client Have Any Questions Or Concerns About Medictions?: No Is Client Complaining Of Any Symptoms That Could Be Side Effects To Medications?: No Any Possible Changes In Medication Regimen?: No  Outpatient Encounter Medications as of 09/02/2019  Medication Sig  . acetaminophen (TYLENOL) 500 MG tablet Take 500 mg by mouth every 6 (six) hours as needed for mild pain or headache.  Marland Kitchen amLODipine (NORVASC) 10 MG tablet Take 10 mg by mouth daily.   . benazepril (LOTENSIN) 40 MG tablet Take 40 mg by mouth at bedtime.  . Calcium 150 MG TABS Take 600 mg by mouth in the morning and at bedtime.   . carvedilol (COREG) 3.125 MG tablet Take 3.125 mg by mouth in the morning and at bedtime.   . hydrochlorothiazide (HYDRODIURIL) 25 MG tablet Take 25 mg by mouth daily.  Marland Kitchen loperamide (IMODIUM) 2 MG capsule Take 1 capsule (2 mg total) by mouth 2 (two) times daily. (Patient taking differently: Take 4 mg by mouth in the morning, at noon, and at bedtime. )  . Menthol-Methyl Salicylate (MUSCLE RUB) 10-15 % CREA Apply 1 application topically 2 (two) times daily as needed for muscle pain.  . Multiple Vitamin (MULTIVITAMIN WITH MINERALS) TABS tablet Take 1 tablet by mouth in the morning and  at bedtime. Woman's one a day  . pantoprazole (PROTONIX) 40 MG tablet Take 40 mg by mouth 2 (two) times daily before a meal.   . psyllium (HYDROCIL/METAMUCIL) 95 % PACK Take 1 packet by mouth daily. (Patient taking differently: Take 1 packet by mouth 3 (three) times daily. )  . REPATHA SURECLICK 325 MG/ML SOAJ Inject 140 mg into  the skin every 14 (fourteen) days.  . traMADol (ULTRAM) 50 MG tablet Take 50 mg by mouth every 6 (six) hours as needed.  . latanoprost (XALATAN) 0.005 % ophthalmic solution Place 1 drop into both eyes at bedtime. (Patient not taking: Reported on 09/04/2019)   No facility-administered encounter medications on file as of 09/02/2019.    Session Summary: Delightful patient.  Recent colon resection for colon cancer.  Managing colostomy well.Reviewed pain with joints.     Goals Addressed            This Visit's Progress   . Patient Stated       Decrease pain in the next 90 days  09/02/2019  Reviewed with patient the use of OTC meds and rubs. Reviewed importance of stretching. Reviewed use of heat and cold. Reviewed with patient that we would review exercises at next home visit.  Tomasa Rand, RN, BSN, CEN [place] Coordinator [phone]        [facility] calendar/ passport tool provided by OT. Provided my contact card for patient and scheduled next home visit for 09/23/2019.  Tomasa Rand, RN, BSN, CEN [facility] [employer] [phone]

## 2019-09-08 ENCOUNTER — Other Ambulatory Visit (HOSPITAL_COMMUNITY): Payer: Self-pay | Admitting: Internal Medicine

## 2019-09-09 MED FILL — REPATHA SURECLICK 140 MG/ML: 140 | 28 days supply | Qty: 2 | Fill #0

## 2019-09-15 DIAGNOSIS — D72819 Decreased white blood cell count, unspecified: Secondary | ICD-10-CM | POA: Diagnosis not present

## 2019-09-15 DIAGNOSIS — I10 Essential (primary) hypertension: Secondary | ICD-10-CM | POA: Diagnosis not present

## 2019-09-15 DIAGNOSIS — Z791 Long term (current) use of non-steroidal anti-inflammatories (NSAID): Secondary | ICD-10-CM | POA: Diagnosis not present

## 2019-09-15 DIAGNOSIS — D649 Anemia, unspecified: Secondary | ICD-10-CM | POA: Diagnosis not present

## 2019-09-18 ENCOUNTER — Other Ambulatory Visit: Payer: Self-pay

## 2019-09-18 NOTE — Patient Instructions (Signed)
Patient will improve sleep hygiene practices.  ACTION PLANNING - CUSTOM  Target Problem Area: Wakes frequently at night   Why Problem May Occur: Has to empty iliostomy Shoulders hurt Goes to bed too early   Target Goal: Improve quality of sleep    STRATEGIES Reviewed sleep hygiene habits and factors that influence sleep, including environmental factors, personal factors, and developing a sleep routine.  Patient provided a slide show presentation with tips to decrease external and internal sleep distractors, and methods to develop a sleep routine.    Practice It is important to practice the strategies so we can determine if they will be effective in helping to reach your goal. Follow these specific recommendations: 1.  Avoid sleeping with tv on (unless it is a music channel) 2.    Do something calming before bed. 3.   Limit food and drink intake before bed to limit number of times   If a strategy does not work the first time, try it again and again (and maybe again). We may make some changes over the next few sessions, based on how they work.  Vangie Bicker, [place], OTR/L [phone]

## 2019-09-18 NOTE — Patient Outreach (Signed)
Aging Gracefully Program  OT Follow-Up Visit  09/18/2019  Donna [name] [facility] 11-Aug-1945 [id number]  Visit:  2- Second Visit  Start Time:  0830 End Time:  0930 Total Minutes:  60   Durable Medical Equipment: Durable Medical Equipment: Other Agricultural engineer) Durable Medical Equipment Distribution Date: 09/18/19  Patient Education: Education Provided: Yes Education Details: how to safely recover from a fall and sleep hygiene practices Person(s) Educated: Patient Comprehension: Verbalized Understanding  Goals:  Goals Addressed            This Visit's Progress   . Patient Stated   On track    Patient will improve sleep hygiene practices.  ACTION PLANNING - CUSTOM  Target Problem Area: Wakes frequently at night   Why Problem May Occur: Has to empty iliostomy Shoulders hurt Goes to bed too early   Target Goal: Improve quality of sleep    STRATEGIES Reviewed sleep hygiene habits and factors that influence sleep, including environmental factors, personal factors, and developing a sleep routine.  Patient provided a slide show presentation with tips to decrease external and internal sleep distractors, and methods to develop a sleep routine.    Practice It is important to practice the strategies so we can determine if they will be effective in helping to reach your goal. Follow these specific recommendations: 1.  Avoid sleeping with tv on (unless it is a music channel) 2.    Do something calming before bed. 3.   Limit food and drink intake before bed to limit number of times   If a strategy does not work the first time, try it again and again (and maybe again). We may make some changes over the next few sessions, based on how they work.  Vangie Bicker, MHA, OTR/L [phone]         Post Clinical Reasoning: Client Action (Goal) One Interventions: improve quality of sleep Did Client Try?: No Reason Client Did Not Try?: Not Enough Time Targeted Problem  Area Status: A Little Better Clinician View Of Client Situation:: [name] is adjusting to her new iliostomy, and it is affecting her abilty to sleep through the night Client View Of His/Her Situation:: progressing daily, has started walking and stopped drinking sodas, is really making a concerted effort to improve her health. Next Visit Plan:: brainstorm goal 2 - increase ability to pick up items from the floor and lower cabinets Vangie Bicker, [place], OTR/L [phone]

## 2019-09-23 ENCOUNTER — Other Ambulatory Visit: Payer: Self-pay

## 2019-09-23 NOTE — Patient Outreach (Signed)
Aging Gracefully Program  RN Visit  09/23/2019  Donna [name] Jan 18, 1945 [id number]  Visit:   RN home visit #2  Start Time:   1200 End Time:   6948 Total Minutes:   35  Readiness To Change Score:     Universal RN Interventions: Calendar Distribution: Yes Exercise Review: Yes Medications: Yes Medication Changes: Yes Mood: Yes Pain: Yes PCP Advocacy/Support: Yes Fall Prevention: Yes Incontinence: Yes Clinician View Of Client Situation: Patient ambulatory to the door and outside without any assistive devices.  Home modifications not yet started. Patient appears happy and content. Shared story of snake. Client View Of His/Her Situation: Patient reports arthritis is bad on days like today with rainy weather. reports pain in both shoulders, lef tknee and back. reports she will have an inection to her back tomorrow. ( steroids) Denies any recent falls. No changes in medications.  Reports she has been working some in her yard.  Reports she has started an exerise program with 2 other neighbors of walking daily. Reports she uses her cane when she walks.   Reports she has nothting to complain about.  Healthcare Provider Communication:none    Clinician View of Client Situation: Clinician View Of Client Situation: Patient ambulatory to the door and outside without any assistive devices.  Home modifications not yet started. Patient appears happy and content. Shared story of snake. Client's View of His/Her Situation: Client View Of His/Her Situation: Patient reports arthritis is bad on days like today with rainy weather. reports pain in both shoulders, lef tknee and back. reports she will have an inection to her back tomorrow. ( steroids) Denies any recent falls. No changes in medications.  Reports she has been working some in her yard.  Reports she has started an exerise program with 2 other neighbors of walking daily. Reports she uses her cane when she walks.   Reports she has nothting to  complain about.  Medication Assessment:no changes      Session Summary: on track with goals. Reviewed home exercise plan. Provided and demonstrated home exercise plan. Will plan follow up home visit on October 13th at 12. Will send an email to Gene with [employer] solutions about when modifications will begin.  Goals    . Patient Stated     Patient will improve sleep hygiene practices.  ACTION PLANNING - CUSTOM  Target Problem Area: Wakes frequently at night   Why Problem May Occur: Has to empty iliostomy Shoulders hurt Goes to bed too early   Target Goal: Improve quality of sleep    STRATEGIES Reviewed sleep hygiene habits and factors that influence sleep, including environmental factors, personal factors, and developing a sleep routine.  Patient provided a slide show presentation with tips to decrease external and internal sleep distractors, and methods to develop a sleep routine.    Practice It is important to practice the strategies so we can determine if they will be effective in helping to reach your goal. Follow these specific recommendations: 1.  Avoid sleeping with tv on (unless it is a music channel) 2.    Do something calming before bed. 3.   Limit food and drink intake before bed to limit number of times   If a strategy does not work the first time, try it again and again (and maybe again). We may make some changes over the next few sessions, based on how they work.  Vangie Bicker, [place], OTR/L [phone]      . Patient Stated  Patient will be able to enter and exit laundry room with increased safety and independence.    . Patient Stated     Patient will improve safety and independence with bathing, tub transfers, and ostomy care.    . Patient Stated     Patient will improve independence and safety with picking items up from the floor.     . Patient Stated     Decrease pain in the next 90 days  09/02/2019  Reviewed with patient the use of  OTC meds and rubs. Reviewed importance of stretching. Reviewed use of heat and cold. Reviewed with patient that we would review exercises at next home visit.  Tomasa Rand, RN, BSN, CEN [facility] [phone]   09/23/2019  Reports she has started a walking exercise plan. Provided written exercise AG program and demonstrated exercises. Encouraged patient to complete exercises daily.  Reviewed benefits of exercise plan. Encouraged OTC pain meds and stretching to help with decreased pain.   Tomasa Rand, RN, BSN, CEN [place] Coordinator [phone]        Tomasa Rand, RN, BSN, CEN [facility] [employer] [phone]

## 2019-09-24 DIAGNOSIS — M5416 Radiculopathy, lumbar region: Secondary | ICD-10-CM | POA: Diagnosis not present

## 2019-10-08 MED FILL — REPATHA SURECLICK 140 MG/ML: 140 | 28 days supply | Qty: 2 | Fill #1

## 2019-10-14 ENCOUNTER — Other Ambulatory Visit: Payer: Self-pay

## 2019-10-15 NOTE — Patient Outreach (Signed)
[place] Visit  10/15/2019  Donna [name] [facility] 02-Sep-1945 [id number]  Visit:    RN visit #3  Start Time:   1200 End Time:   53 Total Minutes:   40  Readiness To Change Score:     Universal RN Interventions: Calendar Distribution: Yes Exercise Review: Yes Medications: Yes Medication Changes: Yes Mood: Yes Pain: Yes Fall Prevention: Yes Incontinence: Yes Clinician View Of Client Situation: Patient is ambulatory to door and in the home without any assistive devices.   Home modifications not started.  Patient reports she is doing well. Son was at home upon my arrival. Client View Of His/Her Situation: Patient reports to me that she has been doing her AG exercises and feels stronger. Reports to me that her friend across the has not been able to walk and she is not walking at this time. Reports she plans to continue a walking plan soon. reports she is doing her daily exercises. Reports she has an injection planned for next week. reports no recent falls, no changes in ,edications. Reports occasional pain at stoma site. Reports she has a phsyical next month and will get her flu shot.  denies any new problems or concerns.  Healthcare Provider Communication:none    Clinician View of Client Situation: Clinician View Of Client Situation: Patient is ambulatory to door and in the home without any assistive devices.   Home modifications not started.  Patient reports she is doing well. Son was at home upon my arrival. [employer] of His/Her Situation: Client View Of His/Her Situation: Patient reports to me that she has been doing her AG exercises and feels stronger. Reports to me that her friend across the has not been able to walk and she is not walking at this time. Reports she plans to continue a walking plan soon. reports she is doing her daily exercises. Reports she has an injection planned for next week. reports no recent falls, no changes in ,edications. Reports  occasional pain at stoma site. Reports she has a phsyical next month and will get her flu shot.  denies any new problems or concerns.  Medication Assessment:no changes in medications per patient report.    Session Summary: Reviewed goals and importance of being active.   Goals Addressed            This Visit's Progress   . Patient Stated       Decrease pain in the next 90 days  09/02/2019  Reviewed with patient the use of OTC meds and rubs. Reviewed importance of stretching. Reviewed use of heat and cold. Reviewed with patient that we would review exercises at next home visit.  Tomasa Rand, RN, BSN, CEN [facility] [phone]   09/23/2019  Reports she has started a walking exercise plan. Provided written exercise AG program and demonstrated exercises. Encouraged patient to complete exercises daily.  Reviewed benefits of exercise plan. Encouraged OTC pain meds and stretching to help with decreased pain.   Tomasa Rand, RN, BSN, CEN [facility] [phone]    10/14/2019  Reviewed pain level and recent injections and future injections.  Encouraged patient to continue to be active and do home exercises.  Reviewed importance of calling MD for increased pain at stoma site.  Tomasa Rand, RN, BSN, CEN [facility] [phone]       Next home visit planned for 11/11/2019 at 12:00.  No updates from Community housing solution to report to patient.  Tomasa Rand, RN, BSN, CEN [facility] [employer] [phone]

## 2019-10-16 ENCOUNTER — Other Ambulatory Visit: Payer: Self-pay | Admitting: Specialist

## 2019-10-16 ENCOUNTER — Other Ambulatory Visit: Payer: Self-pay

## 2019-10-16 NOTE — Patient Outreach (Signed)
Aging Gracefully Program  OT Follow-Up Visit  10/16/2019  Donna [name] [facility] 1945/03/02 [id number]  Visit:  3- Third Visit  Start Time:  0830 End Time:  0915 Total Minutes:  45  Durable Medical Equipment: Adaptive Equipment: Reacher Adaptive Equipment Distribution Date: 10/16/19  Patient Education: Education Provided: Yes Education Details: discussed use of reacher to aide in dressing and retreiving items from overhead and below waist cabinets, spaces Person(s) Educated: Patient Comprehension: Verbalized Understanding, Returned Demonstration  Goals:  Goals Addressed            This Visit's Progress   . Patient Stated   On track    Patient will improve independence and safety with picking items up from the floor.  ACTION PLANNING - FUNCTIONAL MOBILITY Target Problem Area: Difficulty and painful to pick items up from floor and lower cabinets.   Why Problem May Occur: -pinched nerve -left knee pain -fearful of falling        Target Goal: pick items up from floor or lower cabinets safely and without pain.     STRATEGIES Saving Your Energy: DO: DON'T:  Take breaks    Raise the height of surfaces    Take frequent rests. Just taking 15 minutes in a comfortable chair before becoming fatigued may help to restore your energy   Remove tripping hazards     Other    Modifying your home environment and making it safe: DO: DON'T:  Install grab bars in the bathroom Hold onto unsafe surfaces (towel racks, shower curtains, soap dishes, etc)  Remove or strongly secure throw rugs    Provide adequate lighting Use dim lights or lights that cast a lot of shadows  Other   Other    Simplifying the way you set up tasks or daily routines: DO: DON'T:  Move slowly Rush during transfers or walking   Other - sit to pick up items from floor   Use reacher to pick up items from floor   Use reacher to assist in donning clothes    Other - lean on countertop to pick up items from  floor    Practice It is important to practice the strategies so we can determine if they will be effective in helping to reach your goal. Follow these specific recommendations: 1.  Use reacher with either horizontal or vertical mouth, depending on the item to be picked up 2.   Use reacher to pick up items from overhead, lower cabinets, and floor 3.  Use reacher to assist in donning pants and underwear.  If a strategy does not work the first time, try it again and again (and maybe again). We may make some changes over the next few sessions, based on how they work.  Vangie Bicker, MHA, OTR/L [phone]           Post Clinical Reasoning: Client Action (Goal) One Interventions: retrieve items from floor and lower cabinets with less pain and difficulty . Did Client Try?: Yes Targeted Problem Area Status: A Little Better Clinician View Of Client Situation:: Donna [name] has a pinched nerve in her back that causes pain and difficulty when retrieving items from the floor and when donning and doffing pants/underwear.  Patient has modified her approach by leaning on cabinets or sitting down to complete these tasks.  Donna [name] was receptive to use of reacher to improve ease and comfort with both item retrieval and lower extremity dressing. Client View Of His/Her Situation:: has proactively modified how she completes tasks  for increase comfort and ease.  Patient inquired about an ostomy care class offered by [facility].  OT will reach out to wound/ostomy RNs at [facility] to inquire about course offerings.  Next Visit Plan:: brainstorm goal 3, increase safety and ease getting in and out of shower. Vangie Bicker, [place], OTR/L [phone]

## 2019-10-22 DIAGNOSIS — M47816 Spondylosis without myelopathy or radiculopathy, lumbar region: Secondary | ICD-10-CM | POA: Diagnosis not present

## 2019-10-23 DIAGNOSIS — Z932 Ileostomy status: Secondary | ICD-10-CM | POA: Diagnosis not present

## 2019-10-23 DIAGNOSIS — K5190 Ulcerative colitis, unspecified, without complications: Secondary | ICD-10-CM | POA: Diagnosis not present

## 2019-10-26 DIAGNOSIS — K5190 Ulcerative colitis, unspecified, without complications: Secondary | ICD-10-CM | POA: Diagnosis not present

## 2019-10-26 DIAGNOSIS — Z932 Ileostomy status: Secondary | ICD-10-CM | POA: Diagnosis not present

## 2019-11-03 DIAGNOSIS — K5190 Ulcerative colitis, unspecified, without complications: Secondary | ICD-10-CM | POA: Diagnosis not present

## 2019-11-03 DIAGNOSIS — Z932 Ileostomy status: Secondary | ICD-10-CM | POA: Diagnosis not present

## 2019-11-06 DIAGNOSIS — Z23 Encounter for immunization: Secondary | ICD-10-CM | POA: Diagnosis not present

## 2019-11-09 MED FILL — REPATHA SURECLICK 140 MG/ML: 140 | 28 days supply | Qty: 2 | Fill #2

## 2019-11-11 ENCOUNTER — Other Ambulatory Visit: Payer: Self-pay

## 2019-11-12 NOTE — Patient Outreach (Signed)
Aging Gracefully Program  RN Visit  11/12/2019  Donna [name] [facility] November 09, 1945 [id number]  Visit:   RN home visit #4  Start Time:   1200 End Time:   1300 Total Minutes:   60  Readiness To Change Score:     Universal RN Interventions: Calendar Distribution: Yes Exercise Review: Yes Medications: Yes Medication Changes: Yes Mood: Yes Pain: Yes Fall Prevention: Yes Clinician View Of Client Situation: Client ambulatory to door. wearing mask.  Community housing solutions and plumbers present at the home.  Walk in shower being installed. Client View Of His/Her Situation: Patient reports she got her injections and it has helped some. Reports she has been doingher exercises and notes that she feels stronger.  Reports no medications changes. Has had follow up with MD.  reports she is occasionally using a cane.  Reports she is interested in going back to th gym. We reviewed her gym memebrships. I encouraged water exercises that would be easier on her joints.  Healthcare Provider Communication:none    Clinician View of Client Situation: Public affairs consultant Of Client Situation: Client ambulatory to door. wearing mask.  Community housing solutions and plumbers present at the home.  Walk in shower being installed. Client's View of His/Her Situation: Client View Of His/Her Situation: Patient reports she got her injections and it has helped some. Reports she has been doingher exercises and notes that she feels stronger.  Reports no medications changes. Has had follow up with MD.  reports she is occasionally using a cane.  Reports she is interested in going back to th gym. We reviewed her gym memebrships. I encouraged water exercises that would be easier on her joints.  Medication Assessment:no changes in medications reported.    RN Update: RN visits completed. Renovations to be completed on 11/16/2019.  Session Summary: Nursing goals met.  Goals Addressed            This Visit's Progress   .  COMPLETED: Patient Stated       Decrease pain in the next 90 days  09/02/2019  Reviewed with patient the use of OTC meds and rubs. Reviewed importance of stretching. Reviewed use of heat and cold. Reviewed with patient that we would review exercises at next home visit.  Tomasa Rand, RN, BSN, CEN [facility] [phone]   09/23/2019  Reports she has started a walking exercise plan. Provided written exercise AG program and demonstrated exercises. Encouraged patient to complete exercises daily.  Reviewed benefits of exercise plan. Encouraged OTC pain meds and stretching to help with decreased pain.   Tomasa Rand, RN, BSN, CEN [facility] [phone]    10/14/2019  Reviewed pain level and recent injections and future injections.  Encouraged patient to continue to be active and do home exercises.  Reviewed importance of calling MD for increased pain at stoma site.  Tomasa Rand, RN, BSN, CEN [facility] [phone]   11/11/2019  Reports decreased pain. Continues to have pain after laying down to sleep and getting up. Reports AG exercise plan has helped.  Tomasa Rand, RN, BSN, CEN [place] Coordinator [phone]       Closed to nursing. Tomasa Rand, RN, BSN, CEN [facility] [employer] [phone]

## 2019-11-13 ENCOUNTER — Encounter: Payer: Self-pay | Admitting: Specialist

## 2019-11-20 ENCOUNTER — Other Ambulatory Visit: Payer: Self-pay | Admitting: Specialist

## 2019-11-20 ENCOUNTER — Other Ambulatory Visit: Payer: Self-pay

## 2019-11-20 NOTE — Patient Outreach (Signed)
Aging Gracefully Program  [place] FINAL Visit  11/20/2019  Shaneil Yazdi [facility] 10-21-1945 [id number]  Visit:  4- Fourth Visit  Start Time:  0830 End Time:  0920 Total Minutes:  50     Readiness to Change:  Readiness to Change Score: 10     Durable Medical Equipment: Durable Medical Equipment: Shower Chair With Back Durable Medical Equipment Distribution Date: 11/20/19 Adaptive Equipment: Other, Long Handled Sponge (hand held shower, Medical illustrator) Adaptive Equipment Distribution Date: 11/20/19  Patient Education: Education Provided: Yes Education Details: educated on use of shower seat and grab bars, issued "tips for aging at home" booklet Person(s) Educated: Patient Comprehension: Verbalized Understanding  Goals: Goals Addressed            This Visit's Progress   . COMPLETED: Patient Stated       Patient will improve sleep hygiene practices.  ACTION PLANNING - CUSTOM  Target Problem Area: Wakes frequently at night   Why Problem May Occur: Has to empty iliostomy Shoulders hurt Goes to bed too early   Target Goal: Improve quality of sleep    STRATEGIES Reviewed sleep hygiene habits and factors that influence sleep, including environmental factors, personal factors, and developing a sleep routine.  Patient provided a slide show presentation with tips to decrease external and internal sleep distractors, and methods to develop a sleep routine.    Practice It is important to practice the strategies so we can determine if they will be effective in helping to reach your goal. Follow these specific recommendations: 1.  Avoid sleeping with tv on (unless it is a music channel) 2.    Do something calming before bed. 3.   Limit food and drink intake before bed to limit number of times   If a strategy does not work the first time, try it again and again (and maybe again). We may make some changes over the next few sessions, based on how they work.  Vangie Bicker,  [place], OTR/L [phone]      . COMPLETED: Patient Stated   On track    Patient will be able to enter and exit laundry room with increased safety and independence. See functional mobility hand out. Patient is using the grabbars to enter and exit laundry room with increased safety and ease.    . COMPLETED: Patient Stated   On track    Patient will improve safety and independence with bathing, tub transfers, and ostomy care. ACTION PLANNING - FUNCTIONAL MOBILITY Target Problem Area: Difficulty with bathroom transfers   Why Problem May Occur: Knee and back pain, stoma Tub wall is high, frequent needs to empty stoma   Target Goal:  increase ease, independence, and safety with entering and exiting shower, using commode.    STRATEGIES Saving Your Energy: DO: DON'T:  Take breaks    Raise the height of surfaces    Take frequent rests. Just taking 15 minutes in a comfortable chair before becoming fatigued may help to restore your energy   Remove tripping hazards     Other: use bidet to clean toilet, sit on shower seat to bathe    Modifying your home environment and making it safe: DO: DON'T:  Install grab bars in the bathroom Hold onto unsafe surfaces (towel racks, shower curtains, soap dishes, etc)  Remove or strongly secure throw rugs    Provide adequate lighting Use dim lights or lights that cast a lot of shadows  Other transitioned to walk in shower    Other  Simplifying the way you set up tasks or daily routines: DO: DON'T:  Move slowly Rush during transfers or walking   Other  use long handled sponge.   Other    Practice It is important to practice the strategies so we can determine if they will be effective in helping to reach your goal. Follow these specific recommendations: 1. Continue use of walk in shower 2. Use grab bars in shower and laundry room 3.  Use bidet sprayer   If a strategy does not work the first time, try it again and again (and maybe again). We  may make some changes over the next few sessions, based on how they work.         . COMPLETED: Patient Stated       Patient will improve independence and safety with picking items up from the floor.  ACTION PLANNING - FUNCTIONAL MOBILITY Target Problem Area: Difficulty and painful to pick items up from floor and lower cabinets.   Why Problem May Occur: -pinched nerve -left knee pain -fearful of falling        Target Goal: pick items up from floor or lower cabinets safely and without pain.     STRATEGIES Saving Your Energy: DO: DON'T:  Take breaks    Raise the height of surfaces    Take frequent rests. Just taking 15 minutes in a comfortable chair before becoming fatigued may help to restore your energy   Remove tripping hazards     Other    Modifying your home environment and making it safe: DO: DON'T:  Install grab bars in the bathroom Hold onto unsafe surfaces (towel racks, shower curtains, soap dishes, etc)  Remove or strongly secure throw rugs    Provide adequate lighting Use dim lights or lights that cast a lot of shadows  Other   Other    Simplifying the way you set up tasks or daily routines: DO: DON'T:  Move slowly Rush during transfers or walking   Other - sit to pick up items from floor   Use reacher to pick up items from floor   Use reacher to assist in donning clothes    Other - lean on countertop to pick up items from floor    Practice It is important to practice the strategies so we can determine if they will be effective in helping to reach your goal. Follow these specific recommendations: 1.  Use reacher with either horizontal or vertical mouth, depending on the item to be picked up 2.   Use reacher to pick up items from overhead, lower cabinets, and floor 3.  Use reacher to assist in donning pants and underwear.  If a strategy does not work the first time, try it again and again (and maybe again). We may make some changes over the next few  sessions, based on how they work.  Vangie Bicker, MHA, OTR/L [phone]           Post Clinical Reasoning: Client Action (Goal) One Interventions: Patient will be able to transfer in and out of shower with increased ease and safety. Did Client Try?: Yes Targeted Problem Area Status: A Lot Better Client Action (Goal) Two Interventions: Patient will be able to enter and exit laundry room with less difficulty and increased safety. Did Client Try?: Yes Targeted Problem Area Status: A Lot Better Clinician View Of Client Situation:: Ms. Chowning is using her new shower, grab bars, bidet sprayer, and shower seat.  She can see  how helpful these changes have been for her and are allowing her to maintain her independence despite her back and knee pain Client View Of His/Her Situation:: continues to embrace and adapt to change Next Visit Plan:: n/a dc this date Vangie Bicker, [place], OTR/L [phone]

## 2019-11-25 DIAGNOSIS — E7800 Pure hypercholesterolemia, unspecified: Secondary | ICD-10-CM | POA: Diagnosis not present

## 2019-11-25 DIAGNOSIS — N390 Urinary tract infection, site not specified: Secondary | ICD-10-CM | POA: Diagnosis not present

## 2019-11-25 DIAGNOSIS — I10 Essential (primary) hypertension: Secondary | ICD-10-CM | POA: Diagnosis not present

## 2019-11-25 DIAGNOSIS — K5190 Ulcerative colitis, unspecified, without complications: Secondary | ICD-10-CM | POA: Diagnosis not present

## 2019-11-25 DIAGNOSIS — Z932 Ileostomy status: Secondary | ICD-10-CM | POA: Diagnosis not present

## 2019-11-30 ENCOUNTER — Other Ambulatory Visit (HOSPITAL_COMMUNITY): Payer: Self-pay | Admitting: Internal Medicine

## 2019-12-02 DIAGNOSIS — N1831 Chronic kidney disease, stage 3a: Secondary | ICD-10-CM | POA: Diagnosis not present

## 2019-12-02 DIAGNOSIS — Z0000 Encounter for general adult medical examination without abnormal findings: Secondary | ICD-10-CM | POA: Diagnosis not present

## 2019-12-02 DIAGNOSIS — R7309 Other abnormal glucose: Secondary | ICD-10-CM | POA: Diagnosis not present

## 2019-12-02 DIAGNOSIS — Z6835 Body mass index (BMI) 35.0-35.9, adult: Secondary | ICD-10-CM | POA: Diagnosis not present

## 2019-12-02 DIAGNOSIS — Z85038 Personal history of other malignant neoplasm of large intestine: Secondary | ICD-10-CM | POA: Diagnosis not present

## 2019-12-02 DIAGNOSIS — K219 Gastro-esophageal reflux disease without esophagitis: Secondary | ICD-10-CM | POA: Diagnosis not present

## 2019-12-02 DIAGNOSIS — I10 Essential (primary) hypertension: Secondary | ICD-10-CM | POA: Diagnosis not present

## 2019-12-02 DIAGNOSIS — E7800 Pure hypercholesterolemia, unspecified: Secondary | ICD-10-CM | POA: Diagnosis not present

## 2019-12-04 MED FILL — REPATHA SURECLICK 140 MG/ML: 140 | 28 days supply | Qty: 2 | Fill #0

## 2019-12-21 DIAGNOSIS — Z01419 Encounter for gynecological examination (general) (routine) without abnormal findings: Secondary | ICD-10-CM | POA: Diagnosis not present

## 2019-12-23 ENCOUNTER — Telehealth: Payer: Self-pay

## 2019-12-23 DIAGNOSIS — Z932 Ileostomy status: Secondary | ICD-10-CM | POA: Diagnosis not present

## 2019-12-23 DIAGNOSIS — K5190 Ulcerative colitis, unspecified, without complications: Secondary | ICD-10-CM | POA: Diagnosis not present

## 2019-12-23 NOTE — Patient Outreach (Signed)
Aging Gracefully Program  12/23/2019  Kendi Defalco [place] Jun 27, 1945 [id number]   [facility] Evaluation Interviewer attempted to call patient on today regarding Aging Gracefully 5 month follow up. No answer from patient after multiple rings. Left voice message to patient to call back.  Will attempt to call back within 1 week.   [place] Management Assistant [phone]

## 2019-12-30 ENCOUNTER — Other Ambulatory Visit: Payer: Self-pay

## 2019-12-30 NOTE — Patient Outreach (Signed)
Aging Gracefully Program  12/30/2019  Donna [name] [place] May 05, 1945 [id number]  [facility] Evaluation Interviewer made contact with patient. Aging Gracefully 5 month survey completed.   Interviewer will send follow up to Darylene Price at [employer] to send gift card.  [place] Management Assistant [phone]

## 2019-12-30 NOTE — Telephone Encounter (Signed)
This encounter was created in error - please disregard.

## 2019-12-31 ENCOUNTER — Other Ambulatory Visit: Payer: Self-pay

## 2019-12-31 DIAGNOSIS — Z789 Other specified health status: Secondary | ICD-10-CM | POA: Diagnosis not present

## 2019-12-31 DIAGNOSIS — M25562 Pain in left knee: Secondary | ICD-10-CM | POA: Diagnosis not present

## 2019-12-31 DIAGNOSIS — M5442 Lumbago with sciatica, left side: Secondary | ICD-10-CM | POA: Diagnosis not present

## 2019-12-31 DIAGNOSIS — I10 Essential (primary) hypertension: Secondary | ICD-10-CM | POA: Diagnosis not present

## 2019-12-31 DIAGNOSIS — N1831 Chronic kidney disease, stage 3a: Secondary | ICD-10-CM | POA: Diagnosis not present

## 2019-12-31 DIAGNOSIS — R7309 Other abnormal glucose: Secondary | ICD-10-CM | POA: Diagnosis not present

## 2019-12-31 DIAGNOSIS — M791 Myalgia, unspecified site: Secondary | ICD-10-CM | POA: Diagnosis not present

## 2019-12-31 DIAGNOSIS — E7800 Pure hypercholesterolemia, unspecified: Secondary | ICD-10-CM | POA: Diagnosis not present

## 2019-12-31 DIAGNOSIS — T466X5A Adverse effect of antihyperlipidemic and antiarteriosclerotic drugs, initial encounter: Secondary | ICD-10-CM | POA: Diagnosis not present

## 2019-12-31 DIAGNOSIS — Z96652 Presence of left artificial knee joint: Secondary | ICD-10-CM | POA: Diagnosis not present

## 2019-12-31 DIAGNOSIS — M545 Low back pain, unspecified: Secondary | ICD-10-CM | POA: Diagnosis not present

## 2019-12-31 NOTE — Patient Outreach (Signed)
[facility] [facility]) Care Management  12/31/2019  Donna [name] [id number] [birth year] [id number]    Telephone Screen  Referral Date: 12/30/2019 Referral Source: Self Referral Referral Reason: "need help affording colostomy bags" Insurance: [facility]   Outreach attempt # 1 to patient. No answer. RN CM left HIPAA compliant voicemail message along with contact info.     Plan: RN CM will make outreach attempt to patient within 3-4 business days. RN CM will send unsuccessful outreach letter to patient.   Enzo Montgomery, RN,BSN,CCM [place] Management Telephonic Care Management Coordinator Direct Phone: [phone] Toll Free: [phone] Fax: [phone]

## 2020-01-04 ENCOUNTER — Other Ambulatory Visit: Payer: Self-pay

## 2020-01-04 MED FILL — REPATHA SURECLICK 140 MG/ML: 140 | 28 days supply | Qty: 2 | Fill #1

## 2020-01-04 NOTE — Patient Outreach (Signed)
[facility]) Care Management  01/04/2020  Elon Lomeli [id number] 1945/01/30 [id number]   Telephone Screen  Referral Date: 12/30/2019 Referral Source: Self Referral Referral Reason: "need help affording colostomy bags" Insurance: [facility]   Outreach attempt # 2 to patient. Spoke with patient who denies any acute issues or concerns at present. Discussed referral reason. She reports that her insurance is paying a significant part of the cost for her monthly medica supplies which are over $200/month. Her co-pay is only $24/month. Patient reports she is able to manage this as she is aware that she could be paying a lot more. She is taking advantage of [employer] OTC catalog benefit to assist with getting necessary medical things as well. Advised patient no other resources to assist. She voices understanding and appreciated call. She denies any other RN CM needs or concerns at this time and declined need for follow up. She was given [facility] inf to contact info needs should arise       Plan: RN CM will close case at this time.   Enzo Montgomery, RN,BSN,CCM [place] Management Telephonic Care Management Coordinator Direct Phone: [phone] Toll Free: [phone] Fax: [phone]

## 2020-01-21 DIAGNOSIS — K5190 Ulcerative colitis, unspecified, without complications: Secondary | ICD-10-CM | POA: Diagnosis not present

## 2020-01-21 DIAGNOSIS — Z932 Ileostomy status: Secondary | ICD-10-CM | POA: Diagnosis not present

## 2020-01-29 MED FILL — REPATHA SURECLICK 140 MG/ML: 140 | 28 days supply | Qty: 2 | Fill #2

## 2020-02-15 DIAGNOSIS — M5416 Radiculopathy, lumbar region: Secondary | ICD-10-CM | POA: Diagnosis not present

## 2020-02-16 DIAGNOSIS — H2513 Age-related nuclear cataract, bilateral: Secondary | ICD-10-CM | POA: Diagnosis not present

## 2020-02-16 DIAGNOSIS — H40053 Ocular hypertension, bilateral: Secondary | ICD-10-CM | POA: Diagnosis not present

## 2020-02-16 DIAGNOSIS — H02834 Dermatochalasis of left upper eyelid: Secondary | ICD-10-CM | POA: Diagnosis not present

## 2020-02-16 DIAGNOSIS — H04123 Dry eye syndrome of bilateral lacrimal glands: Secondary | ICD-10-CM | POA: Diagnosis not present

## 2020-02-16 DIAGNOSIS — H02831 Dermatochalasis of right upper eyelid: Secondary | ICD-10-CM | POA: Diagnosis not present

## 2020-02-18 DIAGNOSIS — R69 Illness, unspecified: Secondary | ICD-10-CM | POA: Diagnosis not present

## 2020-02-22 DIAGNOSIS — K5190 Ulcerative colitis, unspecified, without complications: Secondary | ICD-10-CM | POA: Diagnosis not present

## 2020-02-22 DIAGNOSIS — Z932 Ileostomy status: Secondary | ICD-10-CM | POA: Diagnosis not present

## 2020-02-24 DIAGNOSIS — M5416 Radiculopathy, lumbar region: Secondary | ICD-10-CM | POA: Diagnosis not present

## 2020-02-29 MED FILL — REPATHA SURECLICK 140 MG/ML: 140 | 28 days supply | Qty: 2 | Fill #3

## 2020-03-01 DIAGNOSIS — K219 Gastro-esophageal reflux disease without esophagitis: Secondary | ICD-10-CM | POA: Diagnosis not present

## 2020-03-01 DIAGNOSIS — Z8719 Personal history of other diseases of the digestive system: Secondary | ICD-10-CM | POA: Diagnosis not present

## 2020-03-16 DIAGNOSIS — M47816 Spondylosis without myelopathy or radiculopathy, lumbar region: Secondary | ICD-10-CM | POA: Diagnosis not present

## 2020-03-18 DIAGNOSIS — K5190 Ulcerative colitis, unspecified, without complications: Secondary | ICD-10-CM | POA: Diagnosis not present

## 2020-03-18 DIAGNOSIS — Z932 Ileostomy status: Secondary | ICD-10-CM | POA: Diagnosis not present

## 2020-03-25 MED FILL — REPATHA SURECLICK 140 MG/ML: 140 | 28 days supply | Qty: 2 | Fill #4

## 2020-03-31 ENCOUNTER — Other Ambulatory Visit (HOSPITAL_COMMUNITY): Payer: Self-pay

## 2020-04-12 ENCOUNTER — Other Ambulatory Visit: Payer: Self-pay

## 2020-04-12 NOTE — Patient Outreach (Signed)
Aging Gracefully Program  04/12/2020  Donna [name] Jun 01, 1945 [id number]  [facility] Evaluation Interviewer made contact with patient. Aging Gracefully final survey scheduled 04/19/20 at 1.  [place] Management Assistant [phone]

## 2020-04-18 ENCOUNTER — Other Ambulatory Visit (HOSPITAL_COMMUNITY): Payer: Self-pay

## 2020-04-18 MED FILL — Evolocumab Subcutaneous Soln Auto-Injector 140 MG/ML: SUBCUTANEOUS | 28 days supply | Qty: 2 | Fill #0 | Status: AC

## 2020-04-20 ENCOUNTER — Other Ambulatory Visit: Payer: Self-pay

## 2020-04-20 DIAGNOSIS — K5190 Ulcerative colitis, unspecified, without complications: Secondary | ICD-10-CM | POA: Diagnosis not present

## 2020-04-20 DIAGNOSIS — Z932 Ileostomy status: Secondary | ICD-10-CM | POA: Diagnosis not present

## 2020-04-20 NOTE — Patient Outreach (Signed)
Aging Gracefully Program  04/20/2020  Tasmine Hipwell [place] 24-May-1945 [id number]  [facility] Evaluation Interviewer made contact with patient. Aging Gracefully final survey completed.   Interviewer will send follow up to Darylene Price at [employer].  [place] Management Assistant [phone]

## 2020-04-21 ENCOUNTER — Other Ambulatory Visit (HOSPITAL_COMMUNITY): Payer: Self-pay

## 2020-04-22 IMAGING — CR CHEST - 2 VIEW
2 series · 2 of 2 positions shown · non-contrast
Comparison: PA and lateral chest 07/27/2016 and 09/19/2014.

CLINICAL DATA: Preoperative examination. Patient for knee
replacement.

EXAM:
CHEST - 2 VIEW

[w chest pa]
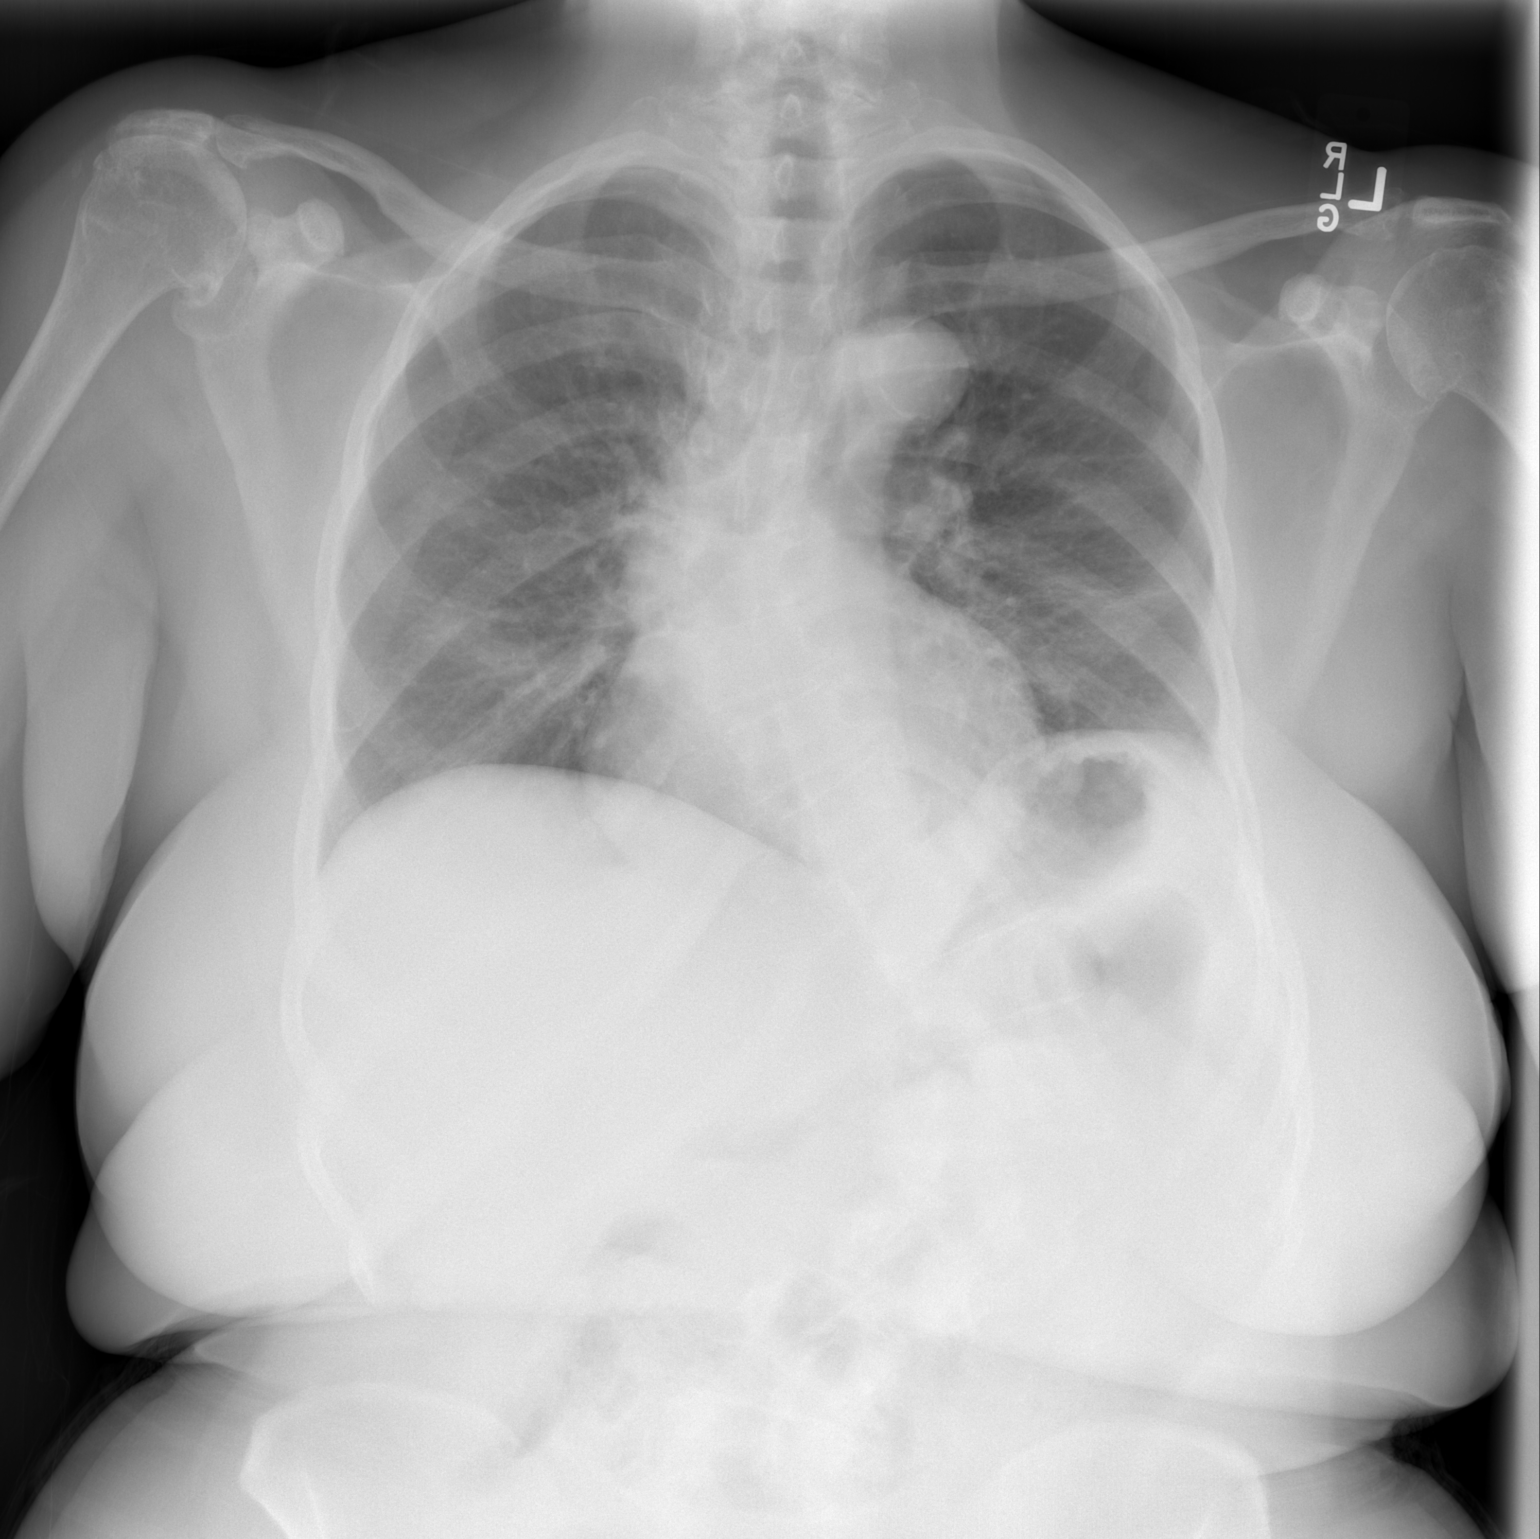

[w chest lat]
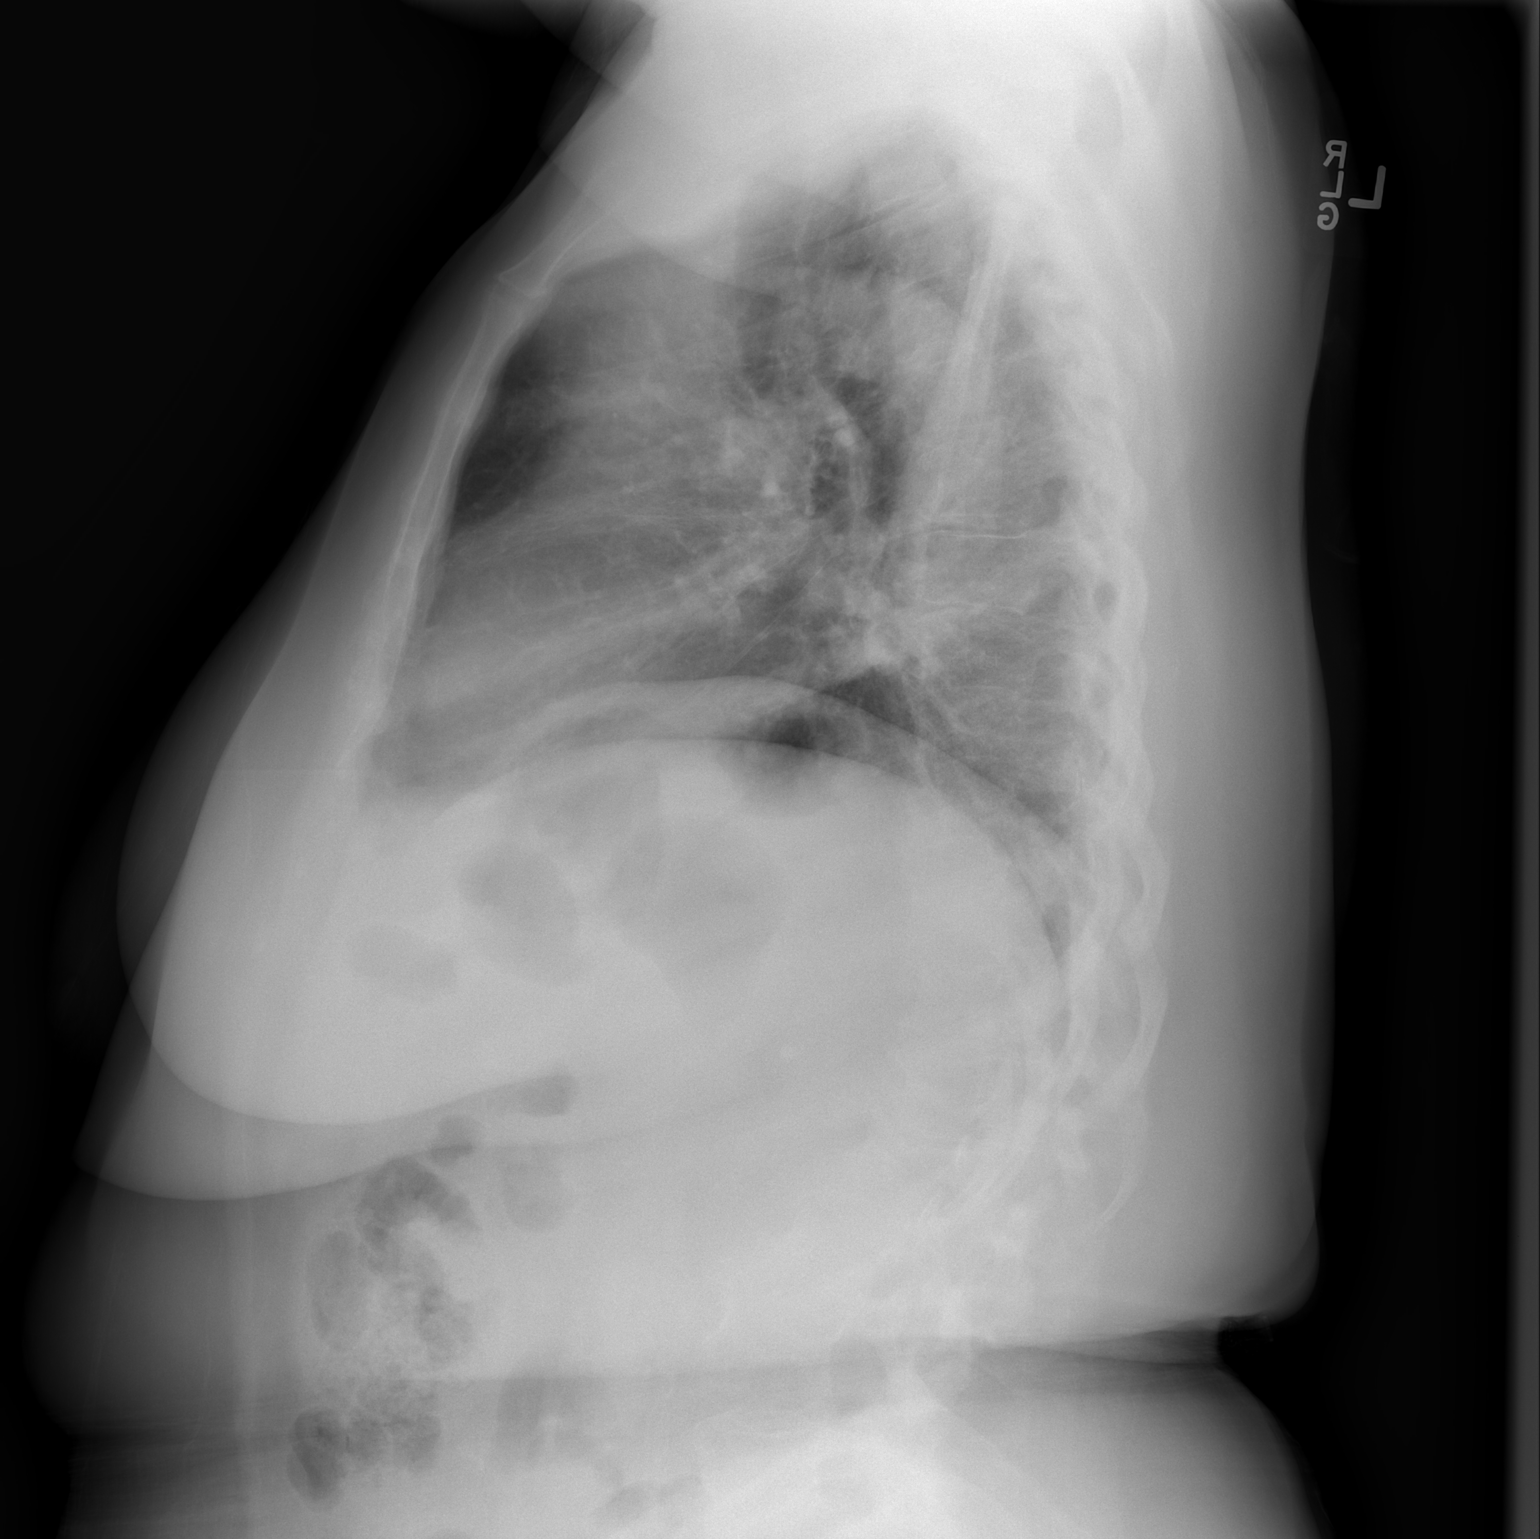

[2 of 2 positions shown; findings below may reference images not displayed]

FINDINGS: The lungs are clear. Heart size is normal. Aortic atherosclerosis
noted. No pneumothorax or pleural fluid. Marked scoliosis is
unchanged.
IMPRESSION: No acute disease.

Atherosclerosis.

Scoliosis.

## 2020-04-25 ENCOUNTER — Other Ambulatory Visit (HOSPITAL_COMMUNITY): Payer: Self-pay

## 2020-05-02 DIAGNOSIS — Z932 Ileostomy status: Secondary | ICD-10-CM | POA: Diagnosis not present

## 2020-05-02 DIAGNOSIS — K5190 Ulcerative colitis, unspecified, without complications: Secondary | ICD-10-CM | POA: Diagnosis not present

## 2020-05-02 DIAGNOSIS — M5441 Lumbago with sciatica, right side: Secondary | ICD-10-CM | POA: Diagnosis not present

## 2020-05-02 DIAGNOSIS — M5442 Lumbago with sciatica, left side: Secondary | ICD-10-CM | POA: Diagnosis not present

## 2020-05-02 DIAGNOSIS — M545 Low back pain, unspecified: Secondary | ICD-10-CM | POA: Diagnosis not present

## 2020-05-06 ENCOUNTER — Other Ambulatory Visit: Payer: Self-pay

## 2020-05-06 ENCOUNTER — Ambulatory Visit (HOSPITAL_COMMUNITY)
Admission: RE | Admit: 2020-05-06 | Discharge: 2020-05-06 | Disposition: A | Discharge status: Home / Self Care | Payer: Medicare HMO | Source: Ambulatory Visit | Attending: Internal Medicine | Admitting: Internal Medicine

## 2020-05-06 DIAGNOSIS — Z432 Encounter for attention to ileostomy: Secondary | ICD-10-CM | POA: Diagnosis not present

## 2020-05-06 DIAGNOSIS — R140 Abdominal distension (gaseous): Secondary | ICD-10-CM | POA: Insufficient documentation

## 2020-05-06 NOTE — Progress Notes (Signed)
[facility]   Reason for visit:  RMQ ileostomy. Duration over 1 year.  She states she gets 2-3 days wear time.  She does not let the pouch keep her from activities she enjoys.  If she leaks, she just returns home.   HPI:  Total colectomy with ileostomy ROS  Review of Systems  Gastrointestinal:       RMQ ileostomy  Skin: Positive for color change.       Peristomal skin intact but previous stripping with no return of melanin.   All other systems reviewed and are negative.  Vital signs:  BP (!) 151/61 (BP Location: Right Arm)   Pulse 69   Temp 98.6 F (37 C) (Oral)   Resp 17   Ht 5' 2"  (1.575 m)   Wt 83.5 kg   BMI 33.65 kg/m  Exam:  Physical Exam  Stoma type/location:  RMQ ileostomy Stomal assessment/size:  25 mm, slightly budded, Uses Coloplast products.  pink and moist.  Has mostly liquid stool, she reports. Her pouch in place now is leaking.  Peristomal assessment:  Skin is intact, but there has been no return of melanin.  Abdomen is rounded and stoma has creasing at umbilicus that extends into pouch area slightly  Treatment options for stomal/peristomal skin: Implemented barrier ring and Mio 1 piece flexible convex pouch.  This will conform to abdominal planes and improve wear time.  Output: liquid brown stool Ostomy pouching: 1pc.coloplast mio.  Education provided:  We discuss changing to the [facility] system for improved fit on abdominal contours. I place her in a pouch and send her home with a pouch.  She has a belt at home she can apply. I demonstrate the barrier ring. She has been using paste and agrees the ring may be better for her. I use Brava barrier strips to promote seal.   I apply hot pack to her pouch to promote seal.  She doesnot have breakdown but has questions about the powder and skin prep she was sent.  We demonstrate crusting and she understands to implement this if her skin becomes irritated.     Impression/dx  RMQ ileostomy with rounded abdomen.   Discussion  Implemented flexible convexity, barrier ring, belt and barrier strips to outside perimeter.  Plan  She was sent home with 1 additional pouching system and will call me to let me know if this helps her.     Visit time: 45 minutes.   Domenic Moras FNP-BC

## 2020-05-13 ENCOUNTER — Other Ambulatory Visit: Payer: Self-pay

## 2020-05-13 ENCOUNTER — Encounter (HOSPITAL_COMMUNITY): Payer: Self-pay | Admitting: Surgery

## 2020-05-16 ENCOUNTER — Other Ambulatory Visit (HOSPITAL_COMMUNITY): Payer: Self-pay

## 2020-05-16 ENCOUNTER — Other Ambulatory Visit (HOSPITAL_COMMUNITY)
Admission: RE | Admit: 2020-05-16 | Discharge: 2020-05-16 | Disposition: A | Discharge status: Home / Self Care | Payer: Medicare HMO | Source: Ambulatory Visit | Attending: Surgery | Admitting: Surgery

## 2020-05-16 DIAGNOSIS — Z20822 Contact with and (suspected) exposure to covid-19: Secondary | ICD-10-CM | POA: Diagnosis not present

## 2020-05-16 DIAGNOSIS — Z01812 Encounter for preprocedural laboratory examination: Secondary | ICD-10-CM | POA: Diagnosis not present

## 2020-05-16 LAB — SARS CORONAVIRUS 2 (TAT 6-24 HRS): SARS Coronavirus 2: NEGATIVE

## 2020-05-16 MED FILL — Evolocumab Subcutaneous Soln Auto-Injector 140 MG/ML: SUBCUTANEOUS | 28 days supply | Qty: 2 | Fill #1 | Status: CN

## 2020-05-18 ENCOUNTER — Ambulatory Visit (HOSPITAL_COMMUNITY)
Admission: RE | Admit: 2020-05-18 | Discharge: 2020-05-18 | Disposition: A | Discharge status: Home / Self Care | Payer: Medicare HMO | Attending: Surgery | Admitting: Surgery

## 2020-05-18 ENCOUNTER — Other Ambulatory Visit: Payer: Self-pay

## 2020-05-18 ENCOUNTER — Encounter (HOSPITAL_COMMUNITY): Payer: Self-pay | Admitting: Surgery

## 2020-05-18 ENCOUNTER — Encounter (HOSPITAL_COMMUNITY)
Admission: RE | Disposition: A | Discharge status: Home / Self Care | Payer: Self-pay | Source: Home / Self Care | Attending: Surgery

## 2020-05-18 ENCOUNTER — Encounter (HOSPITAL_COMMUNITY): Payer: Self-pay | Admitting: Anesthesiology

## 2020-05-18 DIAGNOSIS — G8929 Other chronic pain: Secondary | ICD-10-CM | POA: Insufficient documentation

## 2020-05-18 DIAGNOSIS — K219 Gastro-esophageal reflux disease without esophagitis: Secondary | ICD-10-CM | POA: Insufficient documentation

## 2020-05-18 DIAGNOSIS — Z888 Allergy status to other drugs, medicaments and biological substances status: Secondary | ICD-10-CM | POA: Diagnosis not present

## 2020-05-18 DIAGNOSIS — I10 Essential (primary) hypertension: Secondary | ICD-10-CM | POA: Insufficient documentation

## 2020-05-18 DIAGNOSIS — Z79899 Other long term (current) drug therapy: Secondary | ICD-10-CM | POA: Insufficient documentation

## 2020-05-18 DIAGNOSIS — K624 Stenosis of anus and rectum: Secondary | ICD-10-CM | POA: Insufficient documentation

## 2020-05-18 DIAGNOSIS — M549 Dorsalgia, unspecified: Secondary | ICD-10-CM | POA: Diagnosis not present

## 2020-05-18 DIAGNOSIS — E785 Hyperlipidemia, unspecified: Secondary | ICD-10-CM | POA: Insufficient documentation

## 2020-05-18 DIAGNOSIS — Z1211 Encounter for screening for malignant neoplasm of colon: Secondary | ICD-10-CM | POA: Diagnosis not present

## 2020-05-18 DIAGNOSIS — Z980 Intestinal bypass and anastomosis status: Secondary | ICD-10-CM | POA: Diagnosis not present

## 2020-05-18 DIAGNOSIS — Z86010 Personal history of colonic polyps: Secondary | ICD-10-CM | POA: Diagnosis not present

## 2020-05-18 DIAGNOSIS — Z791 Long term (current) use of non-steroidal anti-inflammatories (NSAID): Secondary | ICD-10-CM | POA: Diagnosis not present

## 2020-05-18 DIAGNOSIS — Z8719 Personal history of other diseases of the digestive system: Secondary | ICD-10-CM | POA: Insufficient documentation

## 2020-05-18 DIAGNOSIS — Z85038 Personal history of other malignant neoplasm of large intestine: Secondary | ICD-10-CM | POA: Insufficient documentation

## 2020-05-18 HISTORY — PX: FLEXIBLE SIGMOIDOSCOPY: SHX5431

## 2020-05-18 SURGERY — SIGMOIDOSCOPY, FLEXIBLE
Anesthesia: Monitor Anesthesia Care

## 2020-05-18 MED ORDER — LACTATED RINGERS IV SOLN: INTRAVENOUS | Status: DC

## 2020-05-18 MED ORDER — SODIUM CHLORIDE 0.9 % IV SOLN: INTRAVENOUS | Status: DC

## 2020-05-18 MED ORDER — MIDAZOLAM HCL (PF) 5 MG/ML IJ SOLN
INTRAMUSCULAR | Status: DC | PRN
  Administered 2020-05-18 (×2): 1 mg via INTRAVENOUS

## 2020-05-18 MED ORDER — FENTANYL CITRATE (PF) 100 MCG/2ML IJ SOLN
INTRAMUSCULAR | Status: AC
  Filled 2020-05-18: qty 4

## 2020-05-18 MED ORDER — MIDAZOLAM HCL (PF) 5 MG/ML IJ SOLN
INTRAMUSCULAR | Status: AC
  Filled 2020-05-18: qty 2

## 2020-05-18 MED ORDER — FENTANYL CITRATE (PF) 100 MCG/2ML IJ SOLN
INTRAMUSCULAR | Status: DC | PRN
  Administered 2020-05-18: 50 ug via INTRAVENOUS
  Administered 2020-05-18: 25 ug via INTRAVENOUS

## 2020-05-18 NOTE — H&P (Signed)
CC: Referred by Dr. Cristina Gong; postop f/u  HPI: [name] is a very pleasant 75yoF with hx of HTN, HLD, GERD, osteoarthritis, in long-standing history of ulcerative colitis-53 year history dating back to when she was 75 years old. She denies ever having had a take steroids for this. She has been well controlled from the disease standpoint with sulfasalazine. She denies any history of infusional agents. She has undergone routine surveillance colonoscopies near annually for the last 30 years. She follows with Dr. Cristina Gong currently. She does have chronic back pain. She denies any blood in her stool. She underwent another one of her routine surveillance colonoscopies 12/73/73 which demonstrated diffuse area of mildly erythematous mucosa in the sigmoid and descending and transverse. 6 mm sigmoid polyp found that was flat and removed with the cold snare. Area around the polyp was also biopsied separately. Area was tattooed with spot proximally and distally. 2 flat polyps in the rectum there were 4 mm that were removed. Anal stricture was also found. Pathology returned:  Inactive chronic colitis negative for dysplasia in the proximal colon-no granuloma or inclusions. 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 2. Mildly active chronic colitis negative for dysplasia in the mid colon 3. Low and high-grade dysplasia with focal area suspicious for invasion and the polyp that was removed from the sigmoid. DALM not excluded. 4. Tissue from around the polypectomy site returned with tubulovillous adenoma and low-grade dysplasia 5. Rectosigmoid biopsy returned mildly active chronic colitis negative for dysplasia.  Her pathology was sent to [facility] for a second pathologist review the findings. By report from Dr. Osborn Coho office, concordance with our pathologist was maintained and this lesion is felt to represent a flat area of cancer involving the polyp with surrounding  dysplasia that is indistinct. This is not amenable to further polypectomy as per Dr.Buccini. She has been referred for consideration of proctocolectomy.  She has 3 children, one of whom was delivered vaginally. She denies any significant birth trauma seceding with this. She denies any history of incontinence to gas, liquid, or solid stool. Currently, she states she has had diarrhea most of her adult life 3-5 bowel movements per day. This is almost always liquid.   OR 5/73/73 and underwent robotic total proctocolectomy with end ileostomy, lysis of adhesions, primary repair of incisional hernia. She recovered well postoperatively and was discharged home 6/73/73. Path - invasive adenocarcinoma, well-differentiated-pTisN0 (0/17 LNs). She has had some bag leaked issues but has home health in place and they're working to get her with a elastic band to assist with holding the bag in place given her habitus.  INTERVAL HX She returns for follow-up. Continues to take fiber-Metamucil and Imodium 2 tabs AM, 2 tabs PM, her output has remained lower in volume and toothpaste in consistency. When she does not take these, it does become quite watery. She has been fitted for an ostomy belt and often wears. She denies fevers/chills/nausea/vomiting. She has been staying well-hydrated. She denies any complaints today. Occasionally her ostomy will nicely everted and other times it is more flush with the skin. Denies any rectal drainage or pain.  PMH: HTN, HLD, GERD, osteoarthritis, in long-standing history of ulcerative colitis  PSH: Exploratory laparotomy for cecal volvulus, 2017, Dr. Donne Hazel C-section x 2 via Pfannenstiel  FHx: Denies FHx of malignancy or known IBD but she reports her father had GI issues.  Social: Denies use of tobacco/EtOH/drugs. She is retired but previously worked Estate agent at [employer]: A comprehensive  10 system review of systems was completed with the patient and  pertinent findings as noted above.   Allergies Statins Depletion *DIETARY PRODUCTS/DIETARY MANAGEMENT PRODUCTS*  Allergies Reconciled   Medication History Imodium A-D (2MG Capsule, 2 (two) Oral as directed, Taken starting 09/10/2019) Active. Loperamide HCl (2MG Capsule, 2 (two) Oral twice a day, Taken starting 11/10/2019) Active. Loperamide A-D (Oral) Specific strength unknown - Active. Latanoprost (0.005% Solution, Ophthalmic) Active. Carvedilol (3.125MG Tablet, Oral) Active. amLODIPine Besylate (10MG Tablet, Oral) Active. Benazepril-hydroCHLOROthiazide (20-12.5MG Tablet, Oral) Active. Calcium (600MG Tablet, Oral) Active. Repatha SureClick (631SH/FW Soln Auto-inj, Subcutaneous) Active. sulfaSALAzine (500MG Tablet, Oral) Active. Folic Acid (1MG Tablet, Oral) Active. Lisinopril-Hydrochlorothiazide (20-12.5MG Tablet, Oral) Active. Apriso (0.375GM Capsule ER 24HR, Oral) Active. Meloxicam (7.5MG Tablet, Oral) Active. Nabumetone (750MG Tablet, Oral) Active. Pantoprazole Sodium (40MG Tablet DR, Oral) Active. Medications Reconciled    Review of Systems General Present- Fatigue. Not Present- Appetite Loss, Chills, Fever, Night Sweats, Weight Gain and Weight Loss. Skin Not Present- Change in Wart/Mole, Dryness, Hives, Jaundice, New Lesions, Non-Healing Wounds, Rash and Ulcer. HEENT Present- Wears glasses/contact lenses. Not Present- Earache, Hearing Loss, Hoarseness, Nose Bleed, Oral Ulcers, Ringing in the Ears, Seasonal Allergies, Sinus Pain, Sore Throat, Visual Disturbances and Yellow Eyes. Respiratory Not Present- Bloody sputum, Chronic Cough, Difficulty Breathing, Snoring and Wheezing. Cardiovascular Not Present- Chest Pain, Difficulty Breathing Lying Down, Leg Cramps, Palpitations, Rapid Heart Rate, Shortness of Breath and Swelling of Extremities. Gastrointestinal Not Present- Abdominal Pain, Bloating, Bloody Stool, Change in Bowel Habits, Chronic diarrhea,  Constipation, Difficulty Swallowing, Excessive gas, Gets full quickly at meals, Hemorrhoids, Indigestion, Nausea, Rectal Pain and Vomiting. Female Genitourinary Not Present- Frequency, Nocturia, Painful Urination, Pelvic Pain and Urgency. Neurological Present- Numbness and Trouble walking. Not Present- Decreased Memory, Fainting, Headaches, Seizures, Tingling, Tremor and Weakness. Psychiatric Not Present- Anxiety, Bipolar, Change in Sleep Pattern, Depression, Fearful and Frequent crying. Endocrine Present- Hot flashes. Not Present- Cold Intolerance, Excessive Hunger, Hair Changes, Heat Intolerance and New Diabetes. Hematology Not Present- Blood Thinners, Easy Bruising, Excessive bleeding, Gland problems, HIV and Persistent Infections.   Physical Exam  The physical exam findings are as follows: Note: Gen: NAD, comfortable and in good spirits; wearing mask CV: rrr GI: Abd soft, NT/ND; incisions without hernia. Ileostomy partially everted, pink, toothpaste consistency output in appliance; not wearing appliance belt Anorectal: Normal perianal skin. DRE - attempted but some narrowing at anal os related to long-standing diversion preventing toleration of awake exam today.  **A chaperone Altamese Cabal, CMA was present for this entire examination    Assessment & Plan  Ms. Beaupre is a very pleasant 89yoF with long standing UC, HTN, HLD, GERD, back/left knee pain/arthritis - here for evaluation for total proctocolectomy in the setting of her most recent endoscopic exam 12/21/18 demonstrating a flat sigmoid polyp with at least carcinoma in situ if not an invasive component and surrounding adenomatous tissue (DALM) not amenable to further endoscopic resection due to indistinct margins.  -OR 5/73/73 - robotic total proctocolectomy with end ileostomy. Pathology returned pTisN0 (0/17)M0. Impression: -She continues to do quite well -We discussed diet as tolerated, working to avoid concentrated sweets -  sugar and salt. Her ostomy output is well controlled with a good consistency output. We discussed continuing a fiber supplement daily or twice daily likely indefinitely and continuing her regimen with Imodium at this time -Appliance belt working well previously; may benefit from Convex appliance - we are placing referral to our outpatient ostomy clinic for evaluation, potential product selection changes -Schedule for surveillance  of her rectal cuff - flexible sigmoidoscopy with moderate sedation if able based on preferences  Nadeen Landau, MD [facility] Use [email or web address] to contact on call provider

## 2020-05-18 NOTE — Anesthesia Preprocedure Evaluation (Deleted)
Anesthesia Evaluation    Airway        Dental   Pulmonary neg pulmonary ROS,           Cardiovascular hypertension, Pt. on medications and Pt. on home beta blockers + Valvular Problems/Murmurs      Neuro/Psych Glaucoma  Neuromuscular disease negative psych ROS   GI/Hepatic Neg liver ROS, GERD  Medicated and Controlled,Hx/o ulcerative colitis Hx/o colon Ca S/P total colectomy   Endo/Other  Obesity Hyperlipidemia  Renal/GU negative Renal ROS  negative genitourinary   Musculoskeletal  (+) Arthritis , Osteoarthritis,  DDD lumbar spine   Abdominal   Peds  Hematology negative hematology ROS (+) anemia ,   Anesthesia Other Findings   Reproductive/Obstetrics                            Anesthesia Physical Anesthesia Plan  ASA: III  Anesthesia Plan: MAC   Post-op Pain Management:    Induction: Intravenous  PONV Risk Score and Plan: 2 and Propofol infusion and Treatment may vary due to age or medical condition  Airway Management Planned: Natural Airway and Simple Face Mask  Additional Equipment:   Intra-op Plan:   Post-operative Plan:   Informed Consent:   Plan Discussed with:   Anesthesia Plan Comments:         Anesthesia Quick Evaluation

## 2020-05-18 NOTE — Op Note (Signed)
[facility] Patient Name: Donna [name] Procedure Date: 05/18/2020 MRN: [id number] Attending MD: Ileana Roup MD, MD Date of Birth: 01-07-1945 CSN: [id number] Age: 75 Admit Type: Outpatient Procedure:                Flexible Sigmoidoscopy Indications:              High risk colon cancer surveillance: Personal                            history of colon cancer, High risk colon cancer                            surveillance: Ulcerative colitis Providers:                Sharon Mt. Lizeth Bencosme MD, MD, Kary Kos RN, RN,                            Laverda Sorenson, Technician Referring MD:              Medicines:                Fentanyl 75 micrograms IV, Midazolam 2 mg IV Complications:            No immediate complications. Estimated Blood Loss:     Estimated blood loss: none. Procedure:                Pre-Anesthesia Assessment:                           - ASA Grade Assessment: II - A patient with mild                            systemic disease.                           - After reviewing the risks and benefits, the                            patient was deemed in satisfactory condition to                            undergo the procedure.                           - The anesthesia plan was to use moderate                            sedation/analgesia (conscious sedation).                           - Immediately prior to administration of                            medications, the patient was re-assessed for                            adequacy to receive sedatives.                           -  Sedation was administered by an endoscopy nurse.                            The sedation level attained was moderate.                           - The heart rate, respiratory rate, oxygen                            saturations, blood pressure, adequacy of pulmonary                            ventilation, and response to care were monitored                            throughout  the procedure.                           - The physical status of the patient was                            re-assessed after the procedure.                           After obtaining informed consent, the scope was                            passed under direct vision. The PCF-H190DL                            ([id number]) Olympus pediatric colonscope was                            introduced through the anus and advanced to the the                            rectum. The flexible sigmoidoscopy was accomplished                            without difficulty. The patient tolerated the                            procedure well. The quality of the bowel                            preparation was adequate. Scope In: 11:38:53 AM Scope Out: 11:41:45 AM Total Procedure Duration: 0 hours 2 minutes 52 seconds  Findings:      The perianal examination was normal - expected mild stenosis related to       dis-use      The digital rectal exam was normal with fifth finger; staple line at top       of anal canal/distal most rectum.      There was evidence of a prior surgical staple line in the distal rectum.       This was non-patent as expected  and was characterized by healthy       appearing mucosa and an intact staple line. No masses or mucosal       abnormalities. Estimated blood loss was minimal. Impression:               - Non-patent surgical anastomosis, characterized by                            healthy appearing mucosa and an intact staple line.                           - No specimens collected. Moderate Sedation:      Moderate (conscious) sedation was administered by the endoscopy nurse       and supervised by the endoscopist. The patient's oxygen saturation,       heart rate, blood pressure and response to care were monitored. Total       physician intraservice time was 9 minutes. Recommendation:           - Continue present medications.                           - Resume previous diet  indefinitely. Procedure Code(s):        --- Professional ---                           [phone], 70, Sigmoidoscopy, flexible; diagnostic,                            including collection of specimen(s) by brushing or                            washing, when performed (separate procedure) Diagnosis Code(s):        --- Professional ---                           I50.388, Personal history of other malignant                            neoplasm of large intestine                           K51.90, Ulcerative colitis, unspecified, without                            complications CPT copyright 2019 American Medical Association. All rights reserved. The codes documented in this report are preliminary and upon coder review may  be revised to meet current compliance requirements. Nadeen Landau, MD Ileana Roup MD, MD 05/18/2020 12:01:58 PM This report has been signed electronically. Number of Addenda: 0

## 2020-05-18 NOTE — Discharge Instructions (Signed)
Flexible Sigmoidoscopy, Care After This sheet gives you information about how to care for yourself after your procedure. Your health care provider may also give you more specific instructions. If you have problems or questions, contact your health care provider. What can I expect after the procedure? After the procedure, it is common to have:  Cramping or pain in your abdomen.  Bloating.  A small amount of blood with your bowel movements. This may happen if a sample of tissue was removed for testing (biopsy). Follow these instructions at home: Eating and drinking  Drink enough fluid to keep your urine pale yellow.  Follow instructions from your health care provider about eating or drinking restrictions.  Resume your normal diet as instructed by your health care provider. Avoid heavy or fried foods that are hard to digest.   Activity  If you were given a medicine to help you relax (sedative) during the procedure, it can affect you for several hours. Do not drive or operate machinery until your health care provider says that it is safe.  Rest as told by your health care provider.  Return to your normal activities as told by your health care provider. Ask your health care provider what activities are safe for you.   General instructions  Take over-the-counter and prescription medicines only as told by your health care provider.  Try walking around when you have cramps or feel bloated.  Keep all follow-up visits as told by your health care provider. This is important. Contact a health care provider if:  You have pain or cramping in your abdomen that gets worse or is not helped with medicine.  You have a small amount of bleeding from your rectum that continues after 24 hours.  You have nausea or vomiting.  You feel weak or dizzy.  You develop a fever. Get help right away if:  You pass large blood clots or see a large amount of blood in the toilet after having a bowel  movement.  You have severe pain in your abdomen.  You have nausea or vomiting for more than 24 hours after the procedure. Summary  After the procedure, you may have cramping or pain in your abdomen or you may have bloating. If you had a sample of tissue removed (biopsy), you may have a small amount of blood with your bowel movements.  Resume your normal diet as instructed by your health care provider. Avoid heavy or fried foods that are hard to digest.  Try walking around when you have cramps or feel bloated.  Get help right away if you pass large blood clots or see a large amount of blood in the toilet after having a bowel movement. This information is not intended to replace advice given to you by your health care provider. Make sure you discuss any questions you have with your health care provider. Document Revised: 12/15/2018 Document Reviewed: 12/15/2018 Elsevier Patient Education  2021 [employer].

## 2020-05-19 ENCOUNTER — Encounter (HOSPITAL_COMMUNITY): Payer: Self-pay | Admitting: Surgery

## 2020-05-19 ENCOUNTER — Other Ambulatory Visit (HOSPITAL_COMMUNITY): Payer: Self-pay

## 2020-05-19 DIAGNOSIS — Z932 Ileostomy status: Secondary | ICD-10-CM | POA: Diagnosis not present

## 2020-05-19 DIAGNOSIS — K5190 Ulcerative colitis, unspecified, without complications: Secondary | ICD-10-CM | POA: Diagnosis not present

## 2020-05-21 ENCOUNTER — Other Ambulatory Visit (HOSPITAL_COMMUNITY): Payer: Self-pay

## 2020-05-23 ENCOUNTER — Other Ambulatory Visit (HOSPITAL_COMMUNITY): Payer: Self-pay | Admitting: Internal Medicine

## 2020-05-23 ENCOUNTER — Other Ambulatory Visit (HOSPITAL_COMMUNITY): Payer: Self-pay

## 2020-05-23 MED ORDER — REPATHA SURECLICK 140 MG/ML ~~LOC~~ SOAJ
SUBCUTANEOUS | 3 refills | Status: DC
  Filled 2020-05-23: qty 2, 28d supply, fill #0
  Filled 2020-06-14: qty 2, 28d supply, fill #1
  Filled 2020-07-12: qty 2, 28d supply, fill #2
  Filled 2020-08-10 – 2020-08-16 (×3): qty 2, 28d supply, fill #3
  Filled 2020-09-14: qty 2, 28d supply, fill #4
  Filled 2020-10-11: qty 2, 28d supply, fill #5
  Filled 2020-11-08: qty 2, 28d supply, fill #6
  Filled 2020-12-02: qty 2, 28d supply, fill #7
  Filled 2021-01-10: qty 2, 28d supply, fill #8
  Filled 2021-02-06: qty 2, 28d supply, fill #9
  Filled 2021-03-08: qty 2, 28d supply, fill #10
  Filled 2021-03-31 (×2): qty 2, 28d supply, fill #11

## 2020-05-25 ENCOUNTER — Other Ambulatory Visit (HOSPITAL_COMMUNITY): Payer: Self-pay

## 2020-05-25 DIAGNOSIS — R7309 Other abnormal glucose: Secondary | ICD-10-CM | POA: Diagnosis not present

## 2020-05-25 DIAGNOSIS — E7801 Familial hypercholesterolemia: Secondary | ICD-10-CM | POA: Diagnosis not present

## 2020-05-26 ENCOUNTER — Other Ambulatory Visit (HOSPITAL_COMMUNITY): Payer: Self-pay

## 2020-05-26 DIAGNOSIS — M545 Low back pain, unspecified: Secondary | ICD-10-CM | POA: Diagnosis not present

## 2020-05-31 DIAGNOSIS — E7800 Pure hypercholesterolemia, unspecified: Secondary | ICD-10-CM | POA: Diagnosis not present

## 2020-05-31 DIAGNOSIS — I10 Essential (primary) hypertension: Secondary | ICD-10-CM | POA: Diagnosis not present

## 2020-05-31 DIAGNOSIS — M5442 Lumbago with sciatica, left side: Secondary | ICD-10-CM | POA: Diagnosis not present

## 2020-05-31 DIAGNOSIS — M545 Low back pain, unspecified: Secondary | ICD-10-CM | POA: Diagnosis not present

## 2020-05-31 DIAGNOSIS — M5441 Lumbago with sciatica, right side: Secondary | ICD-10-CM | POA: Diagnosis not present

## 2020-06-07 DIAGNOSIS — M545 Low back pain, unspecified: Secondary | ICD-10-CM | POA: Diagnosis not present

## 2020-06-09 ENCOUNTER — Other Ambulatory Visit: Payer: Self-pay | Admitting: Internal Medicine

## 2020-06-09 DIAGNOSIS — Z1231 Encounter for screening mammogram for malignant neoplasm of breast: Secondary | ICD-10-CM

## 2020-06-14 ENCOUNTER — Other Ambulatory Visit (HOSPITAL_COMMUNITY): Payer: Self-pay

## 2020-06-16 ENCOUNTER — Other Ambulatory Visit (HOSPITAL_COMMUNITY): Payer: Self-pay

## 2020-06-21 DIAGNOSIS — K5190 Ulcerative colitis, unspecified, without complications: Secondary | ICD-10-CM | POA: Diagnosis not present

## 2020-06-21 DIAGNOSIS — Z932 Ileostomy status: Secondary | ICD-10-CM | POA: Diagnosis not present

## 2020-07-06 DIAGNOSIS — K51018 Ulcerative (chronic) pancolitis with other complication: Secondary | ICD-10-CM | POA: Diagnosis not present

## 2020-07-12 ENCOUNTER — Other Ambulatory Visit (HOSPITAL_COMMUNITY): Payer: Self-pay

## 2020-07-14 ENCOUNTER — Other Ambulatory Visit (HOSPITAL_COMMUNITY): Payer: Self-pay

## 2020-07-20 DIAGNOSIS — K5190 Ulcerative colitis, unspecified, without complications: Secondary | ICD-10-CM | POA: Diagnosis not present

## 2020-07-20 DIAGNOSIS — Z932 Ileostomy status: Secondary | ICD-10-CM | POA: Diagnosis not present

## 2020-08-03 ENCOUNTER — Other Ambulatory Visit: Payer: Self-pay

## 2020-08-03 ENCOUNTER — Ambulatory Visit
Admission: RE | Admit: 2020-08-03 | Discharge: 2020-08-03 | Disposition: A | Discharge status: Home / Self Care | Payer: Medicare HMO | Source: Ambulatory Visit | Attending: Internal Medicine | Admitting: Internal Medicine

## 2020-08-03 DIAGNOSIS — Z1231 Encounter for screening mammogram for malignant neoplasm of breast: Secondary | ICD-10-CM | POA: Diagnosis not present

## 2020-08-09 DIAGNOSIS — H04123 Dry eye syndrome of bilateral lacrimal glands: Secondary | ICD-10-CM | POA: Diagnosis not present

## 2020-08-09 DIAGNOSIS — H40053 Ocular hypertension, bilateral: Secondary | ICD-10-CM | POA: Diagnosis not present

## 2020-08-09 DIAGNOSIS — H2513 Age-related nuclear cataract, bilateral: Secondary | ICD-10-CM | POA: Diagnosis not present

## 2020-08-09 DIAGNOSIS — H02831 Dermatochalasis of right upper eyelid: Secondary | ICD-10-CM | POA: Diagnosis not present

## 2020-08-09 DIAGNOSIS — H02834 Dermatochalasis of left upper eyelid: Secondary | ICD-10-CM | POA: Diagnosis not present

## 2020-08-10 ENCOUNTER — Other Ambulatory Visit (HOSPITAL_COMMUNITY): Payer: Self-pay

## 2020-08-11 ENCOUNTER — Other Ambulatory Visit (HOSPITAL_COMMUNITY): Payer: Self-pay

## 2020-08-12 ENCOUNTER — Other Ambulatory Visit (HOSPITAL_COMMUNITY): Payer: Self-pay

## 2020-08-16 ENCOUNTER — Other Ambulatory Visit (HOSPITAL_COMMUNITY): Payer: Self-pay

## 2020-08-19 DIAGNOSIS — Z932 Ileostomy status: Secondary | ICD-10-CM | POA: Diagnosis not present

## 2020-08-19 DIAGNOSIS — K5190 Ulcerative colitis, unspecified, without complications: Secondary | ICD-10-CM | POA: Diagnosis not present

## 2020-08-24 DIAGNOSIS — Z932 Ileostomy status: Secondary | ICD-10-CM | POA: Diagnosis not present

## 2020-08-24 DIAGNOSIS — K5190 Ulcerative colitis, unspecified, without complications: Secondary | ICD-10-CM | POA: Diagnosis not present

## 2020-09-06 DIAGNOSIS — M791 Myalgia, unspecified site: Secondary | ICD-10-CM | POA: Diagnosis not present

## 2020-09-06 DIAGNOSIS — I10 Essential (primary) hypertension: Secondary | ICD-10-CM | POA: Diagnosis not present

## 2020-09-06 DIAGNOSIS — E7800 Pure hypercholesterolemia, unspecified: Secondary | ICD-10-CM | POA: Diagnosis not present

## 2020-09-06 DIAGNOSIS — R7309 Other abnormal glucose: Secondary | ICD-10-CM | POA: Diagnosis not present

## 2020-09-06 DIAGNOSIS — T466X5A Adverse effect of antihyperlipidemic and antiarteriosclerotic drugs, initial encounter: Secondary | ICD-10-CM | POA: Diagnosis not present

## 2020-09-09 DIAGNOSIS — Z23 Encounter for immunization: Secondary | ICD-10-CM | POA: Diagnosis not present

## 2020-09-13 ENCOUNTER — Other Ambulatory Visit (HOSPITAL_COMMUNITY): Payer: Self-pay

## 2020-09-14 ENCOUNTER — Other Ambulatory Visit (HOSPITAL_COMMUNITY): Payer: Self-pay

## 2020-09-19 DIAGNOSIS — M19211 Secondary osteoarthritis, right shoulder: Secondary | ICD-10-CM | POA: Diagnosis not present

## 2020-09-19 DIAGNOSIS — M19212 Secondary osteoarthritis, left shoulder: Secondary | ICD-10-CM | POA: Diagnosis not present

## 2020-09-23 DIAGNOSIS — Z932 Ileostomy status: Secondary | ICD-10-CM | POA: Diagnosis not present

## 2020-09-23 DIAGNOSIS — K5190 Ulcerative colitis, unspecified, without complications: Secondary | ICD-10-CM | POA: Diagnosis not present

## 2020-10-04 ENCOUNTER — Other Ambulatory Visit (HOSPITAL_COMMUNITY): Payer: Self-pay

## 2020-10-11 ENCOUNTER — Other Ambulatory Visit (HOSPITAL_COMMUNITY): Payer: Self-pay

## 2020-10-11 DIAGNOSIS — Z932 Ileostomy status: Secondary | ICD-10-CM | POA: Diagnosis not present

## 2020-10-11 DIAGNOSIS — K5190 Ulcerative colitis, unspecified, without complications: Secondary | ICD-10-CM | POA: Diagnosis not present

## 2020-10-13 ENCOUNTER — Other Ambulatory Visit (HOSPITAL_COMMUNITY): Payer: Self-pay

## 2020-11-08 ENCOUNTER — Other Ambulatory Visit (HOSPITAL_COMMUNITY): Payer: Self-pay

## 2020-11-09 ENCOUNTER — Other Ambulatory Visit (HOSPITAL_COMMUNITY): Payer: Self-pay

## 2020-11-10 DIAGNOSIS — K5190 Ulcerative colitis, unspecified, without complications: Secondary | ICD-10-CM | POA: Diagnosis not present

## 2020-11-10 DIAGNOSIS — Z932 Ileostomy status: Secondary | ICD-10-CM | POA: Diagnosis not present

## 2020-11-11 ENCOUNTER — Other Ambulatory Visit (HOSPITAL_COMMUNITY): Payer: Self-pay

## 2020-11-30 DIAGNOSIS — M5416 Radiculopathy, lumbar region: Secondary | ICD-10-CM | POA: Diagnosis not present

## 2020-12-02 ENCOUNTER — Other Ambulatory Visit (HOSPITAL_COMMUNITY): Payer: Self-pay

## 2020-12-12 ENCOUNTER — Other Ambulatory Visit (HOSPITAL_COMMUNITY): Payer: Self-pay

## 2020-12-21 DIAGNOSIS — R7309 Other abnormal glucose: Secondary | ICD-10-CM | POA: Diagnosis not present

## 2020-12-21 DIAGNOSIS — I10 Essential (primary) hypertension: Secondary | ICD-10-CM | POA: Diagnosis not present

## 2020-12-22 DIAGNOSIS — N950 Postmenopausal bleeding: Secondary | ICD-10-CM | POA: Diagnosis not present

## 2020-12-27 ENCOUNTER — Other Ambulatory Visit: Payer: Self-pay | Admitting: Internal Medicine

## 2020-12-27 DIAGNOSIS — I10 Essential (primary) hypertension: Secondary | ICD-10-CM | POA: Diagnosis not present

## 2020-12-27 DIAGNOSIS — Z6835 Body mass index (BMI) 35.0-35.9, adult: Secondary | ICD-10-CM | POA: Diagnosis not present

## 2020-12-27 DIAGNOSIS — N1831 Chronic kidney disease, stage 3a: Secondary | ICD-10-CM | POA: Diagnosis not present

## 2020-12-27 DIAGNOSIS — D72819 Decreased white blood cell count, unspecified: Secondary | ICD-10-CM | POA: Diagnosis not present

## 2020-12-27 DIAGNOSIS — R7309 Other abnormal glucose: Secondary | ICD-10-CM | POA: Diagnosis not present

## 2020-12-27 DIAGNOSIS — E7800 Pure hypercholesterolemia, unspecified: Secondary | ICD-10-CM | POA: Diagnosis not present

## 2020-12-27 DIAGNOSIS — K219 Gastro-esophageal reflux disease without esophagitis: Secondary | ICD-10-CM | POA: Diagnosis not present

## 2020-12-27 DIAGNOSIS — Z0000 Encounter for general adult medical examination without abnormal findings: Secondary | ICD-10-CM | POA: Diagnosis not present

## 2020-12-27 DIAGNOSIS — D573 Sickle-cell trait: Secondary | ICD-10-CM | POA: Diagnosis not present

## 2021-01-09 DIAGNOSIS — N950 Postmenopausal bleeding: Secondary | ICD-10-CM | POA: Diagnosis not present

## 2021-01-10 ENCOUNTER — Other Ambulatory Visit (HOSPITAL_COMMUNITY): Payer: Self-pay

## 2021-01-11 ENCOUNTER — Other Ambulatory Visit (HOSPITAL_COMMUNITY): Payer: Self-pay

## 2021-01-12 ENCOUNTER — Other Ambulatory Visit (HOSPITAL_COMMUNITY): Payer: Self-pay

## 2021-01-18 DIAGNOSIS — N950 Postmenopausal bleeding: Secondary | ICD-10-CM | POA: Diagnosis not present

## 2021-01-26 DIAGNOSIS — E7800 Pure hypercholesterolemia, unspecified: Secondary | ICD-10-CM | POA: Diagnosis not present

## 2021-01-26 DIAGNOSIS — R7309 Other abnormal glucose: Secondary | ICD-10-CM | POA: Diagnosis not present

## 2021-01-26 DIAGNOSIS — Z6835 Body mass index (BMI) 35.0-35.9, adult: Secondary | ICD-10-CM | POA: Diagnosis not present

## 2021-01-26 DIAGNOSIS — M791 Myalgia, unspecified site: Secondary | ICD-10-CM | POA: Diagnosis not present

## 2021-01-26 DIAGNOSIS — N1831 Chronic kidney disease, stage 3a: Secondary | ICD-10-CM | POA: Diagnosis not present

## 2021-01-26 DIAGNOSIS — T466X5A Adverse effect of antihyperlipidemic and antiarteriosclerotic drugs, initial encounter: Secondary | ICD-10-CM | POA: Diagnosis not present

## 2021-01-26 DIAGNOSIS — I10 Essential (primary) hypertension: Secondary | ICD-10-CM | POA: Diagnosis not present

## 2021-01-26 DIAGNOSIS — Z789 Other specified health status: Secondary | ICD-10-CM | POA: Diagnosis not present

## 2021-02-06 ENCOUNTER — Other Ambulatory Visit (HOSPITAL_COMMUNITY): Payer: Self-pay

## 2021-02-06 DIAGNOSIS — M19011 Primary osteoarthritis, right shoulder: Secondary | ICD-10-CM | POA: Diagnosis not present

## 2021-02-06 DIAGNOSIS — M19012 Primary osteoarthritis, left shoulder: Secondary | ICD-10-CM | POA: Diagnosis not present

## 2021-02-06 DIAGNOSIS — M67911 Unspecified disorder of synovium and tendon, right shoulder: Secondary | ICD-10-CM | POA: Diagnosis not present

## 2021-02-06 DIAGNOSIS — M67912 Unspecified disorder of synovium and tendon, left shoulder: Secondary | ICD-10-CM | POA: Diagnosis not present

## 2021-02-08 DIAGNOSIS — H2513 Age-related nuclear cataract, bilateral: Secondary | ICD-10-CM | POA: Diagnosis not present

## 2021-02-08 DIAGNOSIS — H04123 Dry eye syndrome of bilateral lacrimal glands: Secondary | ICD-10-CM | POA: Diagnosis not present

## 2021-02-08 DIAGNOSIS — H02834 Dermatochalasis of left upper eyelid: Secondary | ICD-10-CM | POA: Diagnosis not present

## 2021-02-08 DIAGNOSIS — H40053 Ocular hypertension, bilateral: Secondary | ICD-10-CM | POA: Diagnosis not present

## 2021-02-08 DIAGNOSIS — H02831 Dermatochalasis of right upper eyelid: Secondary | ICD-10-CM | POA: Diagnosis not present

## 2021-02-09 ENCOUNTER — Other Ambulatory Visit (HOSPITAL_COMMUNITY): Payer: Self-pay

## 2021-02-22 ENCOUNTER — Ambulatory Visit
Admission: RE | Admit: 2021-02-22 | Discharge: 2021-02-22 | Disposition: A | Discharge status: Home / Self Care | Payer: No Typology Code available for payment source | Source: Ambulatory Visit | Attending: Internal Medicine | Admitting: Internal Medicine

## 2021-02-22 ENCOUNTER — Other Ambulatory Visit: Payer: Self-pay

## 2021-02-22 DIAGNOSIS — E7800 Pure hypercholesterolemia, unspecified: Secondary | ICD-10-CM

## 2021-02-22 DIAGNOSIS — I2510 Atherosclerotic heart disease of native coronary artery without angina pectoris: Secondary | ICD-10-CM | POA: Diagnosis not present

## 2021-02-22 DIAGNOSIS — E785 Hyperlipidemia, unspecified: Secondary | ICD-10-CM | POA: Diagnosis not present

## 2021-02-28 DIAGNOSIS — R931 Abnormal findings on diagnostic imaging of heart and coronary circulation: Secondary | ICD-10-CM | POA: Diagnosis not present

## 2021-02-28 DIAGNOSIS — D573 Sickle-cell trait: Secondary | ICD-10-CM | POA: Diagnosis not present

## 2021-02-28 DIAGNOSIS — I2510 Atherosclerotic heart disease of native coronary artery without angina pectoris: Secondary | ICD-10-CM | POA: Diagnosis not present

## 2021-02-28 DIAGNOSIS — I2729 Other secondary pulmonary hypertension: Secondary | ICD-10-CM | POA: Diagnosis not present

## 2021-03-07 ENCOUNTER — Other Ambulatory Visit (HOSPITAL_COMMUNITY): Payer: Self-pay

## 2021-03-08 ENCOUNTER — Other Ambulatory Visit: Payer: Self-pay | Admitting: Internal Medicine

## 2021-03-08 ENCOUNTER — Other Ambulatory Visit (HOSPITAL_COMMUNITY): Payer: Self-pay

## 2021-03-08 DIAGNOSIS — I2729 Other secondary pulmonary hypertension: Secondary | ICD-10-CM

## 2021-03-09 ENCOUNTER — Other Ambulatory Visit (HOSPITAL_COMMUNITY): Payer: Self-pay

## 2021-03-13 ENCOUNTER — Ambulatory Visit: Payer: Medicare HMO

## 2021-03-13 ENCOUNTER — Other Ambulatory Visit: Payer: Self-pay

## 2021-03-13 DIAGNOSIS — I2729 Other secondary pulmonary hypertension: Secondary | ICD-10-CM

## 2021-03-19 DIAGNOSIS — R931 Abnormal findings on diagnostic imaging of heart and coronary circulation: Secondary | ICD-10-CM | POA: Insufficient documentation

## 2021-03-19 NOTE — Progress Notes (Signed)
? ?Follow up visit ? ?Subjective:  ? ?Donna [name], female    DOB: 12/07/1945, 76 y.o.   MRN: [id number] ? ? ? ? ?HPI ? ?Chief Complaint  ?Patient presents with  ? Elevated coronary artery calcium score  ? Hypertension  ? New Patient (Initial Visit)  ? ? ?76 y.o. African American female with hypertension, mixed hyperlipidemia, elevated coronary calcium score (96th percentile), pulmonary venous hypertension, noted on CT scan 02/2021. ? ?Patient is retired, lives with her son.  She stays active with exercises such as stationary bicycle.  She denies any chest pain, but does endorse exertional dyspnea with more than usual physical activity.  Patient has had longstanding hypertension and hyperlipidemia.  Blood pressure remains uncontrolled.  Recent CT scan showed 96 percentile coronary calcium, and pulmonary venous disease with mild interstitial edema. ? ?Personally reviewed recent echocardiogram and CT chest. ? ? ?Current Outpatient Medications:  ?  acetaminophen (TYLENOL) 500 MG tablet, Take 1,000 mg by mouth every 6 (six) hours as needed for mild pain or headache., Disp: , Rfl:  ?  amLODipine (NORVASC) 10 MG tablet, Take 10 mg by mouth daily. , Disp: , Rfl:  ?  benazepril (LOTENSIN) 40 MG tablet, Take 40 mg by mouth at bedtime., Disp: , Rfl:  ?  calcium carbonate (OSCAL) 1500 (600 Ca) MG TABS tablet, Take 600 mg of elemental calcium by mouth 2 (two) times daily with a meal., Disp: , Rfl:  ?  carvedilol (COREG) 3.125 MG tablet, Take 3.125 mg by mouth daily., Disp: , Rfl:  ?  Evolocumab (REPATHA SURECLICK) 140 MG/ML SOAJ, Inject 1 pen (140 MG) subcutaneously once every 14 days, Disp: 6 mL, Rfl: 3 ?  gabapentin (NEURONTIN) 300 MG capsule, Take 300 mg by mouth at bedtime., Disp: , Rfl:  ?  hydrochlorothiazide (HYDRODIURIL) 25 MG tablet, Take 25 mg by mouth daily., Disp: , Rfl:  ?  latanoprost (XALATAN) 0.005 % ophthalmic solution, Place 1 drop into both eyes at bedtime., Disp: , Rfl:  ?  loperamide (IMODIUM) 2 MG  capsule, Take 1 capsule (2 mg total) by mouth 2 (two) times daily. (Patient taking differently: Take 4 mg by mouth in the morning and at bedtime.), Disp: 60 capsule, Rfl: 1 ?  Menthol-Methyl Salicylate (MUSCLE RUB) 10-15 % CREA, Apply 1 application topically 2 (two) times daily as needed for muscle pain., Disp: , Rfl:  ?  Multiple Vitamin (MULTIVITAMIN WITH MINERALS) TABS tablet, Take 1 tablet by mouth daily. Woman's one a day, Disp: , Rfl:  ?  pantoprazole (PROTONIX) 40 MG tablet, Take 40 mg by mouth 2 (two) times daily before a meal. , Disp: , Rfl:  ?  psyllium (HYDROCIL/METAMUCIL) 95 % PACK, Take 1 packet by mouth daily. (Patient taking differently: Take 1 packet by mouth 3 (three) times daily.), Disp: 30 packet, Rfl: 1 ?  REPATHA SURECLICK 140 MG/ML SOAJ, Inject 140 mg into the skin every 14 (fourteen) days., Disp: , Rfl:  ?  timolol (TIMOPTIC) 0.5 % ophthalmic solution, Place 1 drop into both eyes every morning., Disp: , Rfl:  ?  traMADol (ULTRAM) 50 MG tablet, Take 50 mg by mouth every 6 (six) hours as needed for severe pain., Disp: , Rfl:   ? ?Cardiovascular & other pertient studies: ? ?Reviewed external labs and tests, independently interpreted ? ?EKG 03/20/2021: ?Sinus rhythm 66 npm  ?Left axis deviation ?Otherwise normal EKG ? ? ?Echocardiogram 03/13/2021:  ?Left ventricle cavity is normal in size and wall thickness. Normal global  ?wall   motion. Normal LV systolic function with EF 56%. Indeterminate  ?diastolic filling pattern.  ?Left atrial cavity is mildly dilated.  ?Mild (Grade I) mitral regurgitation.  ?Mild tricuspid regurgitation.  ?Mild pulmonic regurgitation.  ?No evidence of pulmonary hypertension. ? ?CT cardiac scoring 02/22/2021: ?1. Coronary calcium score of 866 is at the 96th percentile for the ?patient's age, sex and race. ?2. Atherosclerosis of the thoracic aorta. ?3. Pulmonary venous hypertension and potentially mild pulmonary ?interstitial edema. ? ?LM: 185 ?LAD: 304 ?Lcx: 96 ?RCA:  280 ? ?Recent labs: ?02/23/2021: ?Glucose 110, BUN/Cr 24/1.25. EGFR 49. Na/K 144/4.5. Rest of the CMP normal ?H/H 11.5/34.2. MCV 87. Platelets 239 ?HbA1C 5.7% ?Chol 133, TG 76, HDL 67, LDL 51 ? ? ?Review of Systems  ?Cardiovascular:  Positive for dyspnea on exertion. Negative for chest pain, leg swelling, palpitations and syncope.  ? ?   ? ? ?Vitals:  ? 03/20/21 0840 03/20/21 0849  ?BP: (!) 160/126 (!) 145/72  ?Pulse: 75 69  ?Resp: 16   ?Temp: 97.8 ?F (36.6 ?C)   ?SpO2: 96% 96%  ? ? ?Body mass index is 34.37 kg/m?. Danley Danker Weights  ? 03/20/21 0840  ?Weight: 194 lb (88 kg)  ? ? ? ?Objective:  ? Physical Exam ?Vitals and nursing note reviewed.  ?Constitutional:   ?   General: She is not in acute distress. ?Neck:  ?   Vascular: No JVD.  ?Cardiovascular:  ?   Rate and Rhythm: Normal rate and regular rhythm.  ?   Heart sounds: Normal heart sounds. No murmur heard. ?Pulmonary:  ?   Effort: Pulmonary effort is normal.  ?   Breath sounds: Normal breath sounds. No wheezing or rales.  ?Musculoskeletal:  ?   Right lower leg: No edema.  ?   Left lower leg: No edema.  ? ? ? ? ? ?   ? ? ?Visit diagnoses: ?  ICD-10-CM   ?1. Primary hypertension  I10   ?  ?2. Mixed hyperlipidemia  E78.2   ?  ?3. Elevated coronary artery calcium score  R93.1   ?  ?  ? ?Orders Placed This Encounter  ?Procedures  ? Aldosterone + renin activity w/ ratio  ? PCV MYOCARDIAL PERFUSION WITH LEXISCAN  ? EKG 12-Lead  ? PCV RENAL/RENAL ARTERY DUPLEX COMPLETE  ? ? ? ?Medication changes this visit: ?Medications Discontinued During This Encounter  ?Medication Reason  ? carvedilol (COREG) 3.125 MG tablet Change in therapy  ?  ?Meds ordered this encounter  ?Medications  ? labetalol (NORMODYNE) 100 MG tablet  ?  Sig: Take 1 tablet (100 mg total) by mouth 2 (two) times daily.  ?  Dispense:  60 tablet  ?  Refill:  2  ? aspirin EC 81 MG tablet  ?  Sig: Take 1 tablet (81 mg total) by mouth daily. Swallow whole.  ?  Dispense:  90 tablet  ?  Refill:  3  ?  ? ?Assessment &  Recommendations:  ? ? ?76 y.o. African American female with hypertension, mixed hyperlipidemia, elevated coronary calcium score (96th percentile), pulmonary venous hypertension, noted on CT scan 02/2021. ? ?Exertional dyspnea: ?Possible etiologies include uncontrolled hypertension, deconditioning, as well as pulmonary venous hypertension, and angina equivalent with coronary disease. ?96th percentile coronary calcium, including in left main. ?Recommend modified Bruce protocol/Lexiscan nuclear stress test. ?Recommend aspirin 81 mg daily.  Continue Repatha. ? ?Hypertension: ?Uncontrolled.  Check renin/cholesterol ratio, renal artery duplex. ?Change carvedilol 3.125 mg twice daily to below 100 mg twice daily. ?In future,  can also consider adding BiDil. ? ?Further recommendations after above testing. ? ? ? ?Nigel Mormon, MD ?Pager: [phone] ?Office: [phone] ?

## 2021-03-20 ENCOUNTER — Ambulatory Visit: Payer: Medicare HMO | Admitting: Cardiology

## 2021-03-20 ENCOUNTER — Other Ambulatory Visit: Payer: Self-pay

## 2021-03-20 ENCOUNTER — Encounter: Payer: Self-pay | Admitting: Cardiology

## 2021-03-20 VITALS — BP 145/72 | HR 69 | Temp 97.8°F | Resp 16 | Ht 63.0 in | Wt 194.0 lb

## 2021-03-20 DIAGNOSIS — I10 Essential (primary) hypertension: Secondary | ICD-10-CM

## 2021-03-20 DIAGNOSIS — R931 Abnormal findings on diagnostic imaging of heart and coronary circulation: Secondary | ICD-10-CM | POA: Diagnosis not present

## 2021-03-20 DIAGNOSIS — R0609 Other forms of dyspnea: Secondary | ICD-10-CM | POA: Diagnosis not present

## 2021-03-20 DIAGNOSIS — E782 Mixed hyperlipidemia: Secondary | ICD-10-CM

## 2021-03-20 MED ORDER — LABETALOL HCL 100 MG PO TABS: 100.0000 mg | ORAL_TABLET | Freq: Two times a day (BID) | ORAL | 2 refills | Status: DC

## 2021-03-20 MED ORDER — ASPIRIN EC 81 MG PO TBEC: 81.0000 mg | DELAYED_RELEASE_TABLET | Freq: Every day | ORAL | 3 refills | Status: AC

## 2021-03-30 ENCOUNTER — Ambulatory Visit: Payer: Medicare HMO

## 2021-03-30 DIAGNOSIS — I10 Essential (primary) hypertension: Secondary | ICD-10-CM | POA: Diagnosis not present

## 2021-03-31 ENCOUNTER — Other Ambulatory Visit (HOSPITAL_COMMUNITY): Payer: Self-pay

## 2021-04-13 DIAGNOSIS — I10 Essential (primary) hypertension: Secondary | ICD-10-CM | POA: Diagnosis not present

## 2021-04-17 ENCOUNTER — Ambulatory Visit: Payer: Medicare HMO

## 2021-04-17 DIAGNOSIS — R931 Abnormal findings on diagnostic imaging of heart and coronary circulation: Secondary | ICD-10-CM | POA: Diagnosis not present

## 2021-04-17 DIAGNOSIS — R0609 Other forms of dyspnea: Secondary | ICD-10-CM | POA: Diagnosis not present

## 2021-04-20 LAB — ALDOSTERONE + RENIN ACTIVITY W/ RATIO
ALDOS/RENIN RATIO: 10.6 (ref 0.0–30.0)
ALDOSTERONE: 12.2 ng/dL (ref 0.0–30.0)
Renin: 1.149 ng/mL/hr (ref 0.167–5.380)

## 2021-04-23 NOTE — Progress Notes (Signed)
? ?Follow up visit ? ?Subjective:  ? ?Donna [name], female    DOB: 22-Oct-1945, 76 y.o.   MRN: [id number] ? ? ? ? ?HPI ? ?Chief Complaint  ?Patient presents with  ? Hypertension  ? Follow-up  ?  4-6 week  ? ? ?76 y.o. African American female with hypertension, mixed hyperlipidemia, elevated coronary calcium score (96th percentile), pulmonary venous hypertension, noted on CT scan 02/2021. ? ?Patient is doing well. She has not had any overt chest pain and dyspnea. Blood pressure is better controlled at home. Reviewed recent test results with the patient, details below.  ? ?Initial consultation visit 03/2021: ?Patient is retired, lives with her son.  She stays active with exercises such as stationary bicycle.  She denies any chest pain, but does endorse exertional dyspnea with more than usual physical activity.  Patient has had longstanding hypertension and hyperlipidemia.  Blood pressure remains uncontrolled.  Recent CT scan showed 96 percentile coronary calcium, and pulmonary venous disease with mild interstitial edema. ? ?Personally reviewed recent echocardiogram and CT chest. ? ? ?Current Outpatient Medications:  ?  acetaminophen (TYLENOL) 500 MG tablet, Take 1,000 mg by mouth every 6 (six) hours as needed for mild pain or headache., Disp: , Rfl:  ?  amLODipine (NORVASC) 10 MG tablet, Take 10 mg by mouth daily. , Disp: , Rfl:  ?  aspirin EC 81 MG tablet, Take 1 tablet (81 mg total) by mouth daily. Swallow whole., Disp: 90 tablet, Rfl: 3 ?  benazepril (LOTENSIN) 40 MG tablet, Take 40 mg by mouth at bedtime., Disp: , Rfl:  ?  calcium carbonate (OSCAL) 1500 (600 Ca) MG TABS tablet, Take 600 mg of elemental calcium by mouth 2 (two) times daily with a meal., Disp: , Rfl:  ?  Evolocumab (REPATHA SURECLICK) 496 MG/ML SOAJ, Inject 1 pen (140 MG) subcutaneously once every 14 days, Disp: 6 mL, Rfl: 3 ?  gabapentin (NEURONTIN) 300 MG capsule, Take 300 mg by mouth at bedtime., Disp: , Rfl:  ?  hydrochlorothiazide  (HYDRODIURIL) 25 MG tablet, Take 25 mg by mouth daily., Disp: , Rfl:  ?  labetalol (NORMODYNE) 100 MG tablet, Take 1 tablet (100 mg total) by mouth 2 (two) times daily., Disp: 60 tablet, Rfl: 2 ?  latanoprost (XALATAN) 0.005 % ophthalmic solution, Place 1 drop into both eyes at bedtime., Disp: , Rfl:  ?  loperamide (IMODIUM) 2 MG capsule, Take 1 capsule (2 mg total) by mouth 2 (two) times daily. (Patient taking differently: Take 4 mg by mouth in the morning and at bedtime.), Disp: 60 capsule, Rfl: 1 ?  Menthol-Methyl Salicylate (MUSCLE RUB) 10-15 % CREA, Apply 1 application topically 2 (two) times daily as needed for muscle pain., Disp: , Rfl:  ?  Multiple Vitamin (MULTIVITAMIN WITH MINERALS) TABS tablet, Take 1 tablet by mouth daily. Woman's one a day, Disp: , Rfl:  ?  pantoprazole (PROTONIX) 40 MG tablet, Take 40 mg by mouth 2 (two) times daily before a meal. , Disp: , Rfl:  ?  psyllium (HYDROCIL/METAMUCIL) 95 % PACK, Take 1 packet by mouth daily. (Patient taking differently: Take 1 packet by mouth 3 (three) times daily.), Disp: 30 packet, Rfl: 1 ?  REPATHA SURECLICK 759 MG/ML SOAJ, Inject 140 mg into the skin every 14 (fourteen) days., Disp: , Rfl:  ?  timolol (TIMOPTIC) 0.5 % ophthalmic solution, Place 1 drop into both eyes every morning., Disp: , Rfl:  ?  traMADol (ULTRAM) 50 MG tablet, Take 50 mg by mouth  every 6 (six) hours as needed for severe pain., Disp: , Rfl:   ? ?Cardiovascular & other pertient studies: ? ?Reviewed external labs and tests, independently interpreted ? ?Lexiscan  Nuclear stress test 04/17/2021:  ?Nondiagnostic ECG stress.The heart rate response was consistent with Lexiscan.  ?Myocardial perfusion is normal. ?Overall LV systolic function is normal without regional wall motion abnormalities. Stress LV EF: 70%.  ?No previous exam available for comparison. Low risk.   ? ?Renal artery duplex  03/31/2021:  ?No evidence of renal artery occlusive disease in either renal artery.  ?Resistivity index  upper normal in bilateral kidneys. Normal sized kidneys.  ?Simple cyst 1cm2 noted in left kidney.  ?Normal appearing abdominal aorta not fully evaluated.  ? ?Echocardiogram 03/13/2021:  ?Left ventricle cavity is normal in size and wall thickness. Normal global  ?wall motion. Normal LV systolic function with EF 56%. Indeterminate  ?diastolic filling pattern.  ?Left atrial cavity is mildly dilated.  ?Mild (Grade I) mitral regurgitation.  ?Mild tricuspid regurgitation.  ?Mild pulmonic regurgitation.  ?No evidence of pulmonary hypertension. ? ?EKG 03/20/2021: ?Sinus rhythm 66 npm  ?Left axis deviation ?Otherwise normal EKG ? ? ?Echocardiogram 03/13/2021:  ?Left ventricle cavity is normal in size and wall thickness. Normal global  ?wall motion. Normal LV systolic function with EF 56%. Indeterminate  ?diastolic filling pattern.  ?Left atrial cavity is mildly dilated.  ?Mild (Grade I) mitral regurgitation.  ?Mild tricuspid regurgitation.  ?Mild pulmonic regurgitation.  ?No evidence of pulmonary hypertension. ? ?CT cardiac scoring 02/22/2021: ?1. Coronary calcium score of 866 is at the 96th percentile for the ?patient's age, sex and race. ?2. Atherosclerosis of the thoracic aorta. ?3. Pulmonary venous hypertension and potentially mild pulmonary ?interstitial edema. ? ?LM: 185 ?LAD: 304 ?Lcx: 96 ?RCA: 280 ? ?Recent labs: ?02/23/2021: ?Glucose 110, BUN/Cr 24/1.25. EGFR 49. Na/K 144/4.5. Rest of the CMP normal ?H/H 11.5/34.2. MCV 87. Platelets 239 ?HbA1C 5.7% ?Chol 133, TG 76, HDL 67, LDL 51 ? ? Latest Reference Range & Units 04/13/21 08:01  ?ALDOSTERONE 0.0 - 30.0 ng/dL 12.2  ?Renin 0.167 - 5.380 ng/mL/hr 1.149  ?ALDOS/RENIN RATIO 0.0 - 30.0  10.6  ? ? ? ?Review of Systems  ?Cardiovascular:  Positive for dyspnea on exertion. Negative for chest pain, leg swelling, palpitations and syncope.  ? ?   ? ? ?Vitals:  ? 04/24/21 0840  ?BP: (!) 150/71  ?Pulse: 64  ?Resp: 16  ?Temp: 97.7 ?F (36.5 ?C)  ?SpO2: 96%  ? ? ?Body mass index is  33.66 kg/m?. Danley Danker Weights  ? 04/24/21 0840  ?Weight: 190 lb (86.2 kg)  ? ? ? ?Objective:  ? Physical Exam ?Vitals and nursing note reviewed.  ?Constitutional:   ?   General: She is not in acute distress. ?Neck:  ?   Vascular: No JVD.  ?Cardiovascular:  ?   Rate and Rhythm: Normal rate and regular rhythm.  ?   Heart sounds: Normal heart sounds. No murmur heard. ?Pulmonary:  ?   Effort: Pulmonary effort is normal.  ?   Breath sounds: Normal breath sounds. No wheezing or rales.  ?Musculoskeletal:  ?   Right lower leg: No edema.  ?   Left lower leg: No edema.  ? ? ? ? ? ?   ? ? ?Visit diagnoses: ?  ICD-10-CM   ?1. Primary hypertension  I10 labetalol (NORMODYNE) 100 MG tablet  ?  ?2. Elevated coronary artery calcium score  R93.1   ?  ?  ? ? ?Medication changes this visit: ?  Medications Discontinued During This Encounter  ?Medication Reason  ? labetalol (NORMODYNE) 100 MG tablet Reorder  ?  ? ?Assessment & Recommendations:  ? ? ?76 y.o. African American female with hypertension, mixed hyperlipidemia, elevated coronary calcium score (96th percentile), pulmonary venous hypertension, noted on CT scan 02/2021. ? ?Exertional dyspnea: ?No ischemia on stress testing (04/2021). ?Likely related to hypertension and deconditioning. ?Continue medical management, given multivessel coronary calcium.  ?In absence of bleeding, continue aspirin, Repatha. ? ?Hypertension: ?Secondary workup negative. Better controlled at home. ?Continue current antihypertensive therapy.  ?Refilled labetalol. ? ?F/u in 6 months. If stable, will see as needed.  ? ? ? ?Nigel Mormon, MD ?Pager: [phone] ?Office: [phone] ?

## 2021-04-24 ENCOUNTER — Ambulatory Visit: Payer: Medicare HMO | Admitting: Cardiology

## 2021-04-24 ENCOUNTER — Encounter: Payer: Self-pay | Admitting: Cardiology

## 2021-04-24 DIAGNOSIS — R931 Abnormal findings on diagnostic imaging of heart and coronary circulation: Secondary | ICD-10-CM

## 2021-04-24 DIAGNOSIS — I10 Essential (primary) hypertension: Secondary | ICD-10-CM | POA: Diagnosis not present

## 2021-04-24 MED ORDER — LABETALOL HCL 100 MG PO TABS: 100.0000 mg | ORAL_TABLET | Freq: Two times a day (BID) | ORAL | 3 refills | Status: DC

## 2021-04-25 ENCOUNTER — Other Ambulatory Visit (HOSPITAL_COMMUNITY): Payer: Self-pay

## 2021-04-26 ENCOUNTER — Other Ambulatory Visit (HOSPITAL_COMMUNITY): Payer: Self-pay

## 2021-04-26 MED ORDER — REPATHA SURECLICK 140 MG/ML ~~LOC~~ SOAJ
SUBCUTANEOUS | 3 refills | Status: DC
  Filled 2021-04-26: qty 2, 28d supply, fill #0
  Filled 2021-05-26: qty 2, 28d supply, fill #1
  Filled 2021-06-21: qty 2, 28d supply, fill #2
  Filled 2021-07-21: qty 2, 28d supply, fill #3
  Filled 2021-08-18: qty 2, 28d supply, fill #4
  Filled 2021-09-15: qty 2, 28d supply, fill #5
  Filled 2021-10-12: qty 2, 28d supply, fill #6
  Filled 2021-11-09: qty 2, 28d supply, fill #7
  Filled 2021-12-07: qty 2, 28d supply, fill #8
  Filled 2022-01-05: qty 2, 28d supply, fill #9
  Filled 2022-02-01: qty 2, 28d supply, fill #10
  Filled 2022-02-27: qty 2, 28d supply, fill #11

## 2021-05-04 ENCOUNTER — Other Ambulatory Visit (HOSPITAL_COMMUNITY): Payer: Self-pay

## 2021-05-10 DIAGNOSIS — M5416 Radiculopathy, lumbar region: Secondary | ICD-10-CM | POA: Diagnosis not present

## 2021-05-26 ENCOUNTER — Other Ambulatory Visit (HOSPITAL_COMMUNITY): Payer: Self-pay

## 2021-06-01 ENCOUNTER — Other Ambulatory Visit (HOSPITAL_COMMUNITY): Payer: Self-pay

## 2021-06-21 ENCOUNTER — Other Ambulatory Visit (HOSPITAL_COMMUNITY): Payer: Self-pay

## 2021-06-28 ENCOUNTER — Other Ambulatory Visit (HOSPITAL_COMMUNITY): Payer: Self-pay

## 2021-07-03 ENCOUNTER — Other Ambulatory Visit: Payer: Self-pay | Admitting: Internal Medicine

## 2021-07-03 DIAGNOSIS — Z1231 Encounter for screening mammogram for malignant neoplasm of breast: Secondary | ICD-10-CM

## 2021-07-13 DIAGNOSIS — M19012 Primary osteoarthritis, left shoulder: Secondary | ICD-10-CM | POA: Diagnosis not present

## 2021-07-13 DIAGNOSIS — M19011 Primary osteoarthritis, right shoulder: Secondary | ICD-10-CM | POA: Diagnosis not present

## 2021-07-21 ENCOUNTER — Other Ambulatory Visit (HOSPITAL_COMMUNITY): Payer: Self-pay

## 2021-07-21 DIAGNOSIS — Z932 Ileostomy status: Secondary | ICD-10-CM | POA: Diagnosis not present

## 2021-07-21 DIAGNOSIS — K5190 Ulcerative colitis, unspecified, without complications: Secondary | ICD-10-CM | POA: Diagnosis not present

## 2021-07-22 DIAGNOSIS — J069 Acute upper respiratory infection, unspecified: Secondary | ICD-10-CM | POA: Diagnosis not present

## 2021-07-22 DIAGNOSIS — U071 COVID-19: Secondary | ICD-10-CM | POA: Diagnosis not present

## 2021-07-26 ENCOUNTER — Other Ambulatory Visit: Payer: Self-pay

## 2021-07-26 NOTE — Patient Outreach (Signed)
[place] [facility]) Care Management  07/26/2021  Donna [name] [id number] [birth year] [id number]   Telephone call to patient for nurse call.  No answer.  HIPAA compliant voice message left.    Plan: RN CM will attempt again within 4 business days and send letter.  Jone Baseman, RN, MSN [facility] Care Management Care Management Coordinator Direct Line [phone] Toll Free: [phone]  Fax: [phone]

## 2021-07-27 ENCOUNTER — Other Ambulatory Visit (HOSPITAL_COMMUNITY): Payer: Self-pay

## 2021-07-31 ENCOUNTER — Other Ambulatory Visit: Payer: Self-pay

## 2021-07-31 NOTE — Patient Outreach (Signed)
[place] [facility]) Care Management  07/31/2021  Donna [name] OZHYQM Feb 16, 1945 [id number]   Telephone call to patient for nurse call.  No answer.  HIPAA compliant voice message left.    Plan: RN CM will attempt again within 4 business days.  Jone Baseman, RN, MSN [facility] Care Management Care Management Coordinator Direct Line [phone] Toll Free: [phone]  Fax: [phone]

## 2021-08-02 ENCOUNTER — Other Ambulatory Visit: Payer: Self-pay

## 2021-08-02 NOTE — Patient Outreach (Signed)
[place] [facility]) Care Management  08/02/2021  Donna [name] AYGEFU 11-21-1945 [id number]   Telephone call to patient for nurse call. Patient doing ok recently had a bout with COVID.  Discussed COVID precautions.    Discussed [facility] services and nurse call. Patient agreeable.    Care Plan : General Nursing  (Adult)  Updates made by Donna Billings, RN since 08/02/2021 12:00 AM     Problem: Chronic Disease Management and Care Coordination Needs HTN   Priority: High     Long-Range Goal: Development of Plan of Care for Management of HTN   Start Date: 08/02/2021  Expected End Date: 12/31/2021  Priority: High  Note:   Current Barriers:  Chronic Disease Management support and education needs related to HTN   RNCM Clinical Goal(s):  Patient will verbalize basic understanding of  HTN disease process and self health management plan as evidenced by blood pressure less than 140/80  through collaboration with RN Care manager, provider, and care team.   Interventions: Education and support related to HTN Inter-disciplinary care team collaboration (see longitudinal plan of care) Evaluation of current treatment plan related to  self management and patient's adherence to plan as established by provider   Hypertension Interventions:  (Status:  New goal.) Long Term Goal Last practice recorded BP readings:  BP Readings from Last 3 Encounters:  04/24/21 (!) 150/71  03/20/21 (!) 145/72  05/18/20 (!) 144/62  Most recent eGFR/CrCl: No results found for: "EGFR"  No components found for: "CRCL"  Evaluation of current treatment plan related to hypertension self management and patient's adherence to plan as established by provider Provided education to patient re: stroke prevention, s/s of heart attack and stroke Reviewed medications with patient and discussed importance of compliance  Patient Goals/Self-Care Activities: Take all medications as prescribed Attend all scheduled provider  appointments check blood pressure daily write blood pressure results in a log or diary take blood pressure log to all doctor appointments  Follow Up Plan:  Telephone follow up appointment with care management team member scheduled for:  November The patient has been provided with contact information for the care management team and has been advised to call with any health related questions or concerns.      Plan: RN CM will provide ongoing education and support to patient through phone calls.   RN CM will send welcome packet with consent to patient.   RN CM will send initial barriers letter, assessment, and care plan to primary care physician.   RN CM will contact patient in November and patient agrees to next contact and care plan.  Donna Baseman, RN, MSN [facility] Care Management Care Management Coordinator Direct Line [phone] Toll Free: [phone]  Fax: [phone]

## 2021-08-02 NOTE — Patient Instructions (Signed)
Patient Goals/Self-Care Activities: Take all medications as prescribed Attend all scheduled provider appointments check blood pressure daily write blood pressure results in a log or diary take blood pressure log to all doctor appointments

## 2021-08-04 ENCOUNTER — Ambulatory Visit
Admission: RE | Admit: 2021-08-04 | Discharge: 2021-08-04 | Disposition: A | Discharge status: Home / Self Care | Payer: Medicare HMO | Source: Ambulatory Visit | Attending: Internal Medicine | Admitting: Internal Medicine

## 2021-08-04 DIAGNOSIS — Z1231 Encounter for screening mammogram for malignant neoplasm of breast: Secondary | ICD-10-CM | POA: Diagnosis not present

## 2021-08-09 DIAGNOSIS — H40053 Ocular hypertension, bilateral: Secondary | ICD-10-CM | POA: Diagnosis not present

## 2021-08-14 DIAGNOSIS — R69 Illness, unspecified: Secondary | ICD-10-CM | POA: Diagnosis not present

## 2021-08-17 ENCOUNTER — Other Ambulatory Visit (HOSPITAL_COMMUNITY): Payer: Self-pay

## 2021-08-18 ENCOUNTER — Other Ambulatory Visit (HOSPITAL_COMMUNITY): Payer: Self-pay

## 2021-08-23 DIAGNOSIS — Z932 Ileostomy status: Secondary | ICD-10-CM | POA: Diagnosis not present

## 2021-08-23 DIAGNOSIS — K5190 Ulcerative colitis, unspecified, without complications: Secondary | ICD-10-CM | POA: Diagnosis not present

## 2021-08-24 ENCOUNTER — Other Ambulatory Visit (HOSPITAL_COMMUNITY): Payer: Self-pay

## 2021-08-31 ENCOUNTER — Other Ambulatory Visit: Payer: Self-pay

## 2021-08-31 NOTE — Patient Outreach (Signed)
[place] [facility]) Care Management  08/31/2021  Cathern Tahir [facility] 20-Jul-1945 [id number]   RN CM closing case:  pt will be followed for care coordination by [facility].  Jone Baseman, RN, MSN [facility] Care Management Care Management Coordinator Direct Line [phone] Toll Free: [phone]  Fax: [phone]

## 2021-09-15 ENCOUNTER — Other Ambulatory Visit (HOSPITAL_COMMUNITY): Payer: Self-pay

## 2021-09-21 ENCOUNTER — Other Ambulatory Visit (HOSPITAL_COMMUNITY): Payer: Self-pay

## 2021-09-22 DIAGNOSIS — K5190 Ulcerative colitis, unspecified, without complications: Secondary | ICD-10-CM | POA: Diagnosis not present

## 2021-09-22 DIAGNOSIS — Z932 Ileostomy status: Secondary | ICD-10-CM | POA: Diagnosis not present

## 2021-10-12 ENCOUNTER — Other Ambulatory Visit (HOSPITAL_COMMUNITY): Payer: Self-pay

## 2021-10-13 DIAGNOSIS — Z23 Encounter for immunization: Secondary | ICD-10-CM | POA: Diagnosis not present

## 2021-10-18 ENCOUNTER — Other Ambulatory Visit (HOSPITAL_COMMUNITY): Payer: Self-pay

## 2021-10-24 DIAGNOSIS — Z932 Ileostomy status: Secondary | ICD-10-CM | POA: Diagnosis not present

## 2021-10-24 DIAGNOSIS — K5190 Ulcerative colitis, unspecified, without complications: Secondary | ICD-10-CM | POA: Diagnosis not present

## 2021-10-25 ENCOUNTER — Encounter: Payer: Self-pay | Admitting: Cardiology

## 2021-10-25 ENCOUNTER — Ambulatory Visit: Payer: Medicare HMO | Admitting: Cardiology

## 2021-10-25 VITALS — BP 143/62 | HR 68 | Ht 63.0 in | Wt 185.0 lb

## 2021-10-25 DIAGNOSIS — R931 Abnormal findings on diagnostic imaging of heart and coronary circulation: Secondary | ICD-10-CM

## 2021-10-25 DIAGNOSIS — R0609 Other forms of dyspnea: Secondary | ICD-10-CM | POA: Diagnosis not present

## 2021-10-25 DIAGNOSIS — I10 Essential (primary) hypertension: Secondary | ICD-10-CM

## 2021-10-25 NOTE — Progress Notes (Signed)
Follow up visit  Subjective:   Donna [name], female    DOB: 1945/01/27, 76 y.o.   MRN: [id number]     HPI  Chief Complaint  Patient presents with   Hypertension        Follow-up    76 y.o. African American female with hypertension, mixed hyperlipidemia, elevated coronary calcium score (96th percentile), pulmonary venous hypertension, noted on CT scan 02/2021.  Patient denies any chest pain. Mild exertional dyspnea is unchanged. She feels tired and fatigued at all times.  Initial consultation visit 03/2021: Patient is retired, lives with her son.  She stays active with exercises such as stationary bicycle.  She denies any chest pain, but does endorse exertional dyspnea with more than usual physical activity.  Patient has had longstanding hypertension and hyperlipidemia.  Blood pressure remains uncontrolled.  Recent CT scan showed 96 percentile coronary calcium, and pulmonary venous disease with mild interstitial edema.  Personally reviewed recent echocardiogram and CT chest.   Current Outpatient Medications:    acetaminophen (TYLENOL) 500 MG tablet, Take 1,000 mg by mouth every 6 (six) hours as needed for mild pain or headache., Disp: , Rfl:    amLODipine (NORVASC) 10 MG tablet, Take 10 mg by mouth daily. , Disp: , Rfl:    aspirin EC 81 MG tablet, Take 1 tablet (81 mg total) by mouth daily. Swallow whole., Disp: 90 tablet, Rfl: 3   benazepril (LOTENSIN) 40 MG tablet, Take 40 mg by mouth at bedtime., Disp: , Rfl:    calcium carbonate (OSCAL) 1500 (600 Ca) MG TABS tablet, Take 600 mg of elemental calcium by mouth 2 (two) times daily with a meal., Disp: , Rfl:    carvedilol (COREG) 3.125 MG tablet, Take 3.125 mg by mouth 2 (two) times daily with a meal., Disp: , Rfl:    Evolocumab (REPATHA SURECLICK) 094 MG/ML SOAJ, Inject 1 pen (140 MG) subcutaneously once every 14 days, Disp: 6 mL, Rfl: 3   gabapentin (NEURONTIN) 300 MG capsule, Take 300 mg by mouth at bedtime., Disp: ,  Rfl:    hydrochlorothiazide (HYDRODIURIL) 25 MG tablet, Take 25 mg by mouth daily., Disp: , Rfl:    latanoprost (XALATAN) 0.005 % ophthalmic solution, Place 1 drop into both eyes at bedtime., Disp: , Rfl:    loperamide (IMODIUM) 2 MG capsule, Take 1 capsule (2 mg total) by mouth 2 (two) times daily. (Patient taking differently: Take 4 mg by mouth in the morning and at bedtime.), Disp: 60 capsule, Rfl: 1   Menthol-Methyl Salicylate (MUSCLE RUB) 10-15 % CREA, Apply 1 application topically 2 (two) times daily as needed for muscle pain., Disp: , Rfl:    Multiple Vitamin (MULTIVITAMIN WITH MINERALS) TABS tablet, Take 1 tablet by mouth daily. Woman's one a day, Disp: , Rfl:    pantoprazole (PROTONIX) 40 MG tablet, Take 40 mg by mouth 2 (two) times daily before a meal. , Disp: , Rfl:    psyllium (HYDROCIL/METAMUCIL) 95 % PACK, Take 1 packet by mouth daily. (Patient taking differently: Take 1 packet by mouth 3 (three) times daily.), Disp: 30 packet, Rfl: 1   REPATHA SURECLICK 709 MG/ML SOAJ, Inject 140 mg into the skin every 14 (fourteen) days., Disp: , Rfl:    timolol (TIMOPTIC) 0.5 % ophthalmic solution, Place 1 drop into both eyes every morning., Disp: , Rfl:    traMADol (ULTRAM) 50 MG tablet, Take 50 mg by mouth every 6 (six) hours as needed for severe pain., Disp: , Rfl:  labetalol (NORMODYNE) 100 MG tablet, Take 1 tablet (100 mg total) by mouth 2 (two) times daily. (Patient not taking: Reported on 10/25/2021), Disp: 180 tablet, Rfl: 3   Cardiovascular & other pertient studies:  Reviewed external labs and tests, independently interpreted  EKG 10/25/2021: Sinus rhythm 68 bpm  Nonspecific T-abnormality  Lexiscan  Nuclear stress test 04/17/2021:  Nondiagnostic ECG stress.The heart rate response was consistent with Lexiscan.  Myocardial perfusion is normal. Overall LV systolic function is normal without regional wall motion abnormalities. Stress LV EF: 70%.  No previous exam available for  comparison. Low risk.    Renal artery duplex  03/31/2021:  No evidence of renal artery occlusive disease in either renal artery.  Resistivity index upper normal in bilateral kidneys. Normal sized kidneys.  Simple cyst 1cm2 noted in left kidney.  Normal appearing abdominal aorta not fully evaluated.   Echocardiogram 03/13/2021:  Left ventricle cavity is normal in size and wall thickness. Normal global  wall motion. Normal LV systolic function with EF 56%. Indeterminate  diastolic filling pattern.  Left atrial cavity is mildly dilated.  Mild (Grade I) mitral regurgitation.  Mild tricuspid regurgitation.  Mild pulmonic regurgitation.  No evidence of pulmonary hypertension.  EKG 03/20/2021: Sinus rhythm 66 npm  Left axis deviation Otherwise normal EKG   Echocardiogram 03/13/2021:  Left ventricle cavity is normal in size and wall thickness. Normal global  wall motion. Normal LV systolic function with EF 56%. Indeterminate  diastolic filling pattern.  Left atrial cavity is mildly dilated.  Mild (Grade I) mitral regurgitation.  Mild tricuspid regurgitation.  Mild pulmonic regurgitation.  No evidence of pulmonary hypertension.  CT cardiac scoring 02/22/2021: 1. Coronary calcium score of 866 is at the 96th percentile for the patient's age, sex and race. 2. Atherosclerosis of the thoracic aorta. 3. Pulmonary venous hypertension and potentially mild pulmonary interstitial edema.  LM: 185 LAD: 304 Lcx: 96 RCA: 280  Recent labs: 02/23/2021: Glucose 110, BUN/Cr 24/1.25. EGFR 49. Na/K 144/4.5. Rest of the CMP normal H/H 11.5/34.2. MCV 87. Platelets 239 HbA1C 5.7% Chol 133, TG 76, HDL 67, LDL 51   Latest Reference Range & Units 04/13/21 08:01  ALDOSTERONE 0.0 - 30.0 ng/dL 12.2  Renin 0.167 - 5.380 ng/mL/hr 1.149  ALDOS/RENIN RATIO 0.0 - 30.0  10.6     Review of Systems  Constitutional: Positive for malaise/fatigue.  Cardiovascular:  Positive for dyspnea on exertion. Negative  for chest pain, leg swelling, palpitations and syncope.         Vitals:   10/25/21 0842  BP: (!) 143/62  Pulse: 68  SpO2: 98%    Body mass index is 32.77 kg/m. Filed Weights   10/25/21 0842  Weight: 185 lb (83.9 kg)     Objective:   Physical Exam Vitals and nursing note reviewed.  Constitutional:      General: She is not in acute distress. Neck:     Vascular: No JVD.  Cardiovascular:     Rate and Rhythm: Normal rate and regular rhythm.     Heart sounds: Normal heart sounds. No murmur heard. Pulmonary:     Effort: Pulmonary effort is normal.     Breath sounds: Normal breath sounds. No wheezing or rales.  Musculoskeletal:     Right lower leg: No edema.     Left lower leg: No edema.             Visit diagnoses:   ICD-10-CM   1. Primary hypertension  I10 EKG 12-Lead  Assessment & Recommendations:    76 y.o. African American female with hypertension, mixed hyperlipidemia, elevated coronary calcium score (96th percentile), pulmonary venous hypertension, noted on CT scan 02/2021.  Exertional dyspnea: No ischemia on stress testing (04/2021). Likely related to hypertension and deconditioning. Continue medical management, given multivessel coronary calcium.  In absence of bleeding, continue aspirin, Repatha.  Hypertension: Secondary workup negative. Better controlled at home. No change made to antihypertensive therapy today.  Fatigue: Unlikely cardiac in origin. Has f/u w/Dr. Shelia Media soon. Could consider checking TSH, sleep study. Will also get lipid panel with annual physical.  F/u in 6 months    Nigel Mormon, MD Pager: [phone] Office: [phone]

## 2021-11-03 ENCOUNTER — Telehealth: Payer: Self-pay

## 2021-11-03 NOTE — Patient Outreach (Signed)
  Care Coordination   Follow Up Visit Note   11/03/2021 Name: Donna [name] IEPPIR MRN: [id number] DOB: May 10, 1945  Donna [name] is a 76 y.o. year old female who sees Deland Pretty, MD for primary care. I spoke with  Donna [name] by phone today.  What matters to the patients health and wellness today? Maintaining  Health   Goals Addressed             This Visit's Progress    Managing Hypertension       Care Coordination Interventions: Evaluation of current treatment plan related to hypertension self management and patient's adherence to plan as established by provider Provided education to patient re: stroke prevention, s/s of heart attack and stroke Discussed plans with patient for ongoing care management follow up and provided patient with direct contact information for care management team          SDOH assessments and interventions completed:  Yes  SDOH Interventions Today    Flowsheet Row Most Recent Value  SDOH Interventions   Food Insecurity Interventions Intervention Not Indicated  Utilities Interventions Intervention Not Indicated        Care Coordination Interventions Activated:  Yes  Care Coordination Interventions:  Yes, provided   Follow up plan: Follow up call scheduled for February    Encounter Outcome:  Pt. Visit Completed   Jone Baseman, RN, MSN [place] Management Care Management Coordinator Direct Line [phone]

## 2021-11-03 NOTE — Patient Instructions (Signed)
Visit Information  Thank you for taking time to visit with me today. Please don't hesitate to contact me if I can be of assistance to you.   Following are the goals we discussed today:   Goals Addressed             This Visit's Progress    Managing Hypertension       Care Coordination Interventions: Evaluation of current treatment plan related to hypertension self management and patient's adherence to plan as established by provider Provided education to patient re: stroke prevention, s/s of heart attack and stroke Discussed plans with patient for ongoing care management follow up and provided patient with direct contact information for care management team          Our next appointment is by telephone on 02/06/22 at 1030 am  Please call the care guide team at [phone] if you need to cancel or reschedule your appointment.   If you are experiencing a Mental Health or [place] or need someone to talk to, please call the Suicide and Crisis Lifeline: 988   Patient verbalizes understanding of instructions and care plan provided today and agrees to view in [place]. Active MyChart status and patient understanding of how to access instructions and care plan via MyChart confirmed with patient.     Telephone follow up appointment with care management team member scheduled for: February  Selby Slovacek J Tannya Gonet, RN, MSN [place] Management Care Management Coordinator Direct Line [phone]

## 2021-11-07 DIAGNOSIS — M25511 Pain in right shoulder: Secondary | ICD-10-CM | POA: Diagnosis not present

## 2021-11-07 DIAGNOSIS — M67911 Unspecified disorder of synovium and tendon, right shoulder: Secondary | ICD-10-CM | POA: Diagnosis not present

## 2021-11-07 DIAGNOSIS — M67912 Unspecified disorder of synovium and tendon, left shoulder: Secondary | ICD-10-CM | POA: Diagnosis not present

## 2021-11-07 DIAGNOSIS — M25512 Pain in left shoulder: Secondary | ICD-10-CM | POA: Diagnosis not present

## 2021-11-09 ENCOUNTER — Other Ambulatory Visit (HOSPITAL_COMMUNITY): Payer: Self-pay

## 2021-11-15 ENCOUNTER — Other Ambulatory Visit (HOSPITAL_COMMUNITY): Payer: Self-pay

## 2021-11-22 DIAGNOSIS — Z932 Ileostomy status: Secondary | ICD-10-CM | POA: Diagnosis not present

## 2021-11-22 DIAGNOSIS — K5190 Ulcerative colitis, unspecified, without complications: Secondary | ICD-10-CM | POA: Diagnosis not present

## 2021-11-28 DIAGNOSIS — M5416 Radiculopathy, lumbar region: Secondary | ICD-10-CM | POA: Diagnosis not present

## 2021-12-07 ENCOUNTER — Other Ambulatory Visit (HOSPITAL_COMMUNITY): Payer: Self-pay

## 2021-12-12 ENCOUNTER — Other Ambulatory Visit: Payer: Self-pay

## 2021-12-14 DIAGNOSIS — M5416 Radiculopathy, lumbar region: Secondary | ICD-10-CM | POA: Diagnosis not present

## 2021-12-22 DIAGNOSIS — Z932 Ileostomy status: Secondary | ICD-10-CM | POA: Diagnosis not present

## 2021-12-22 DIAGNOSIS — K5190 Ulcerative colitis, unspecified, without complications: Secondary | ICD-10-CM | POA: Diagnosis not present

## 2021-12-26 DIAGNOSIS — I10 Essential (primary) hypertension: Secondary | ICD-10-CM | POA: Diagnosis not present

## 2021-12-26 DIAGNOSIS — R7309 Other abnormal glucose: Secondary | ICD-10-CM | POA: Diagnosis not present

## 2022-01-03 DIAGNOSIS — E7800 Pure hypercholesterolemia, unspecified: Secondary | ICD-10-CM | POA: Diagnosis not present

## 2022-01-03 DIAGNOSIS — Z23 Encounter for immunization: Secondary | ICD-10-CM | POA: Diagnosis not present

## 2022-01-03 DIAGNOSIS — Z0000 Encounter for general adult medical examination without abnormal findings: Secondary | ICD-10-CM | POA: Diagnosis not present

## 2022-01-03 DIAGNOSIS — I10 Essential (primary) hypertension: Secondary | ICD-10-CM | POA: Diagnosis not present

## 2022-01-03 DIAGNOSIS — Z85038 Personal history of other malignant neoplasm of large intestine: Secondary | ICD-10-CM | POA: Diagnosis not present

## 2022-01-03 DIAGNOSIS — Z932 Ileostomy status: Secondary | ICD-10-CM | POA: Diagnosis not present

## 2022-01-03 DIAGNOSIS — N1832 Chronic kidney disease, stage 3b: Secondary | ICD-10-CM | POA: Diagnosis not present

## 2022-01-03 DIAGNOSIS — R7309 Other abnormal glucose: Secondary | ICD-10-CM | POA: Diagnosis not present

## 2022-01-05 ENCOUNTER — Other Ambulatory Visit (HOSPITAL_COMMUNITY): Payer: Self-pay

## 2022-01-23 DIAGNOSIS — Z01419 Encounter for gynecological examination (general) (routine) without abnormal findings: Secondary | ICD-10-CM | POA: Diagnosis not present

## 2022-01-23 DIAGNOSIS — Z85038 Personal history of other malignant neoplasm of large intestine: Secondary | ICD-10-CM | POA: Diagnosis not present

## 2022-01-23 DIAGNOSIS — N952 Postmenopausal atrophic vaginitis: Secondary | ICD-10-CM | POA: Diagnosis not present

## 2022-01-23 DIAGNOSIS — Z6834 Body mass index (BMI) 34.0-34.9, adult: Secondary | ICD-10-CM | POA: Diagnosis not present

## 2022-01-24 DIAGNOSIS — K5190 Ulcerative colitis, unspecified, without complications: Secondary | ICD-10-CM | POA: Diagnosis not present

## 2022-01-24 DIAGNOSIS — Z932 Ileostomy status: Secondary | ICD-10-CM | POA: Diagnosis not present

## 2022-02-01 ENCOUNTER — Other Ambulatory Visit (HOSPITAL_COMMUNITY): Payer: Self-pay

## 2022-02-01 ENCOUNTER — Other Ambulatory Visit: Payer: Self-pay

## 2022-02-02 ENCOUNTER — Other Ambulatory Visit (HOSPITAL_COMMUNITY): Payer: Self-pay

## 2022-02-06 ENCOUNTER — Ambulatory Visit: Payer: Self-pay

## 2022-02-06 NOTE — Patient Outreach (Signed)
  Care Coordination   02/06/2022 Name: Tashya Alberty MZTAEW MRN: [id number] DOB: 03-14-1945   Care Coordination Outreach Attempts:  An unsuccessful telephone outreach was attempted today to offer the patient information about available care coordination services as a benefit of their health plan.   Follow Up Plan:  Additional outreach attempts will be made to offer the patient care coordination information and services.   Encounter Outcome:  No Answer   Care Coordination Interventions:  No, not indicated    Jone Baseman, RN, MSN [place] Management Care Management Coordinator Direct Line [phone]

## 2022-02-07 DIAGNOSIS — H2513 Age-related nuclear cataract, bilateral: Secondary | ICD-10-CM | POA: Diagnosis not present

## 2022-02-07 DIAGNOSIS — H02834 Dermatochalasis of left upper eyelid: Secondary | ICD-10-CM | POA: Diagnosis not present

## 2022-02-07 DIAGNOSIS — H02831 Dermatochalasis of right upper eyelid: Secondary | ICD-10-CM | POA: Diagnosis not present

## 2022-02-07 DIAGNOSIS — H04123 Dry eye syndrome of bilateral lacrimal glands: Secondary | ICD-10-CM | POA: Diagnosis not present

## 2022-02-07 DIAGNOSIS — H40053 Ocular hypertension, bilateral: Secondary | ICD-10-CM | POA: Diagnosis not present

## 2022-02-15 ENCOUNTER — Other Ambulatory Visit: Payer: Self-pay | Admitting: Cardiology

## 2022-02-15 DIAGNOSIS — I10 Essential (primary) hypertension: Secondary | ICD-10-CM

## 2022-02-22 DIAGNOSIS — Z932 Ileostomy status: Secondary | ICD-10-CM | POA: Diagnosis not present

## 2022-02-22 DIAGNOSIS — K5190 Ulcerative colitis, unspecified, without complications: Secondary | ICD-10-CM | POA: Diagnosis not present

## 2022-02-23 ENCOUNTER — Ambulatory Visit: Payer: Self-pay

## 2022-02-23 NOTE — Patient Outreach (Signed)
  Care Coordination   02/23/2022 Name: Donna [name] [id number] MRN: [id number] DOB: [birth year]   Care Coordination Outreach Attempts:  A second unsuccessful outreach was attempted today to offer the patient with information about available care coordination services as a benefit of their health plan.     Follow Up Plan:  Additional outreach attempts will be made to offer the patient care coordination information and services.   Encounter Outcome:  No Answer   Care Coordination Interventions:  No, not indicated    Jone Baseman, RN, MSN [place] Management Care Management Coordinator Direct Line [phone]

## 2022-02-27 ENCOUNTER — Other Ambulatory Visit (HOSPITAL_COMMUNITY): Payer: Self-pay

## 2022-03-05 ENCOUNTER — Other Ambulatory Visit: Payer: Self-pay

## 2022-03-12 ENCOUNTER — Ambulatory Visit: Payer: Self-pay

## 2022-03-12 NOTE — Patient Outreach (Signed)
  Care Coordination   03/12/2022 Name: Nasha Diss WGNFAO MRN: [id number] DOB: [birth year]   Care Coordination Outreach Attempts:  A third unsuccessful outreach was attempted today to offer the patient with information about available care coordination services as a benefit of their health plan.   Follow Up Plan:  No further outreach attempts will be made at this time. We have been unable to contact the patient to offer or enroll patient in care coordination services  Encounter Outcome:  No Answer    Care Coordination Interventions:  No, not indicated    Jone Baseman, RN, MSN [place] [facility] Care Management Coordinator Direct Line [phone]

## 2022-03-23 DIAGNOSIS — Z932 Ileostomy status: Secondary | ICD-10-CM | POA: Diagnosis not present

## 2022-03-23 DIAGNOSIS — K5190 Ulcerative colitis, unspecified, without complications: Secondary | ICD-10-CM | POA: Diagnosis not present

## 2022-03-28 ENCOUNTER — Other Ambulatory Visit (HOSPITAL_COMMUNITY): Payer: Self-pay

## 2022-03-29 ENCOUNTER — Other Ambulatory Visit: Payer: Self-pay

## 2022-03-29 ENCOUNTER — Other Ambulatory Visit (HOSPITAL_COMMUNITY): Payer: Self-pay

## 2022-03-29 MED ORDER — REPATHA SURECLICK 140 MG/ML ~~LOC~~ SOAJ
140.0000 mg | SUBCUTANEOUS | 3 refills | Status: DC
  Filled 2022-03-29: qty 2, 28d supply, fill #0
  Filled 2022-04-25: qty 2, 28d supply, fill #1

## 2022-04-01 DIAGNOSIS — I10 Essential (primary) hypertension: Secondary | ICD-10-CM | POA: Diagnosis not present

## 2022-04-01 DIAGNOSIS — K219 Gastro-esophageal reflux disease without esophagitis: Secondary | ICD-10-CM | POA: Diagnosis not present

## 2022-04-01 DIAGNOSIS — R7309 Other abnormal glucose: Secondary | ICD-10-CM | POA: Diagnosis not present

## 2022-04-01 DIAGNOSIS — E7800 Pure hypercholesterolemia, unspecified: Secondary | ICD-10-CM | POA: Diagnosis not present

## 2022-04-02 ENCOUNTER — Other Ambulatory Visit: Payer: Self-pay

## 2022-04-03 ENCOUNTER — Other Ambulatory Visit (HOSPITAL_COMMUNITY): Payer: Self-pay

## 2022-04-03 ENCOUNTER — Other Ambulatory Visit: Payer: Self-pay

## 2022-04-04 DIAGNOSIS — M5416 Radiculopathy, lumbar region: Secondary | ICD-10-CM | POA: Diagnosis not present

## 2022-04-09 DIAGNOSIS — K51818 Other ulcerative colitis with other complication: Secondary | ICD-10-CM | POA: Diagnosis not present

## 2022-04-09 DIAGNOSIS — Z932 Ileostomy status: Secondary | ICD-10-CM | POA: Diagnosis not present

## 2022-04-24 DIAGNOSIS — K5100 Ulcerative (chronic) pancolitis without complications: Secondary | ICD-10-CM | POA: Diagnosis not present

## 2022-04-24 DIAGNOSIS — R1319 Other dysphagia: Secondary | ICD-10-CM | POA: Diagnosis not present

## 2022-04-24 DIAGNOSIS — K222 Esophageal obstruction: Secondary | ICD-10-CM | POA: Diagnosis not present

## 2022-04-25 ENCOUNTER — Other Ambulatory Visit (HOSPITAL_COMMUNITY): Payer: Self-pay

## 2022-04-25 DIAGNOSIS — Z932 Ileostomy status: Secondary | ICD-10-CM | POA: Diagnosis not present

## 2022-04-25 DIAGNOSIS — K5190 Ulcerative colitis, unspecified, without complications: Secondary | ICD-10-CM | POA: Diagnosis not present

## 2022-04-26 ENCOUNTER — Encounter: Payer: Self-pay | Admitting: Cardiology

## 2022-04-26 ENCOUNTER — Ambulatory Visit: Payer: Medicare HMO | Admitting: Cardiology

## 2022-04-26 ENCOUNTER — Other Ambulatory Visit: Payer: Self-pay

## 2022-04-26 VITALS — BP 146/70 | HR 68 | Ht 63.0 in | Wt 190.0 lb

## 2022-04-26 DIAGNOSIS — R0602 Shortness of breath: Secondary | ICD-10-CM

## 2022-04-26 DIAGNOSIS — R5383 Other fatigue: Secondary | ICD-10-CM | POA: Diagnosis not present

## 2022-04-26 DIAGNOSIS — R931 Abnormal findings on diagnostic imaging of heart and coronary circulation: Secondary | ICD-10-CM | POA: Diagnosis not present

## 2022-04-26 DIAGNOSIS — R0609 Other forms of dyspnea: Secondary | ICD-10-CM | POA: Diagnosis not present

## 2022-04-26 MED ORDER — REPATHA SURECLICK 140 MG/ML ~~LOC~~ SOAJ: 140.0000 mg | SUBCUTANEOUS | 3 refills | Status: DC

## 2022-04-26 NOTE — Progress Notes (Signed)
Follow up visit  Subjective:   Donna [name] ZOXWRU, female    DOB: 01-Jun-1945, 77 y.o.   MRN: [id number]     HPI  Chief Complaint  Patient presents with   Shortness of Breath   Follow-up   Results    77 y.o. African American female with hypertension, mixed hyperlipidemia, elevated coronary calcium score (96th percentile), pulmonary venous hypertension, noted on CT scan 02/2021.  Patient continues to have generalized fatigue and mild unchanged exertional dyspnea.  She denies any chest pain.  She is compliant with medical therapy.  Initial consultation visit 03/2021: Patient is retired, lives with her son.  She stays active with exercises such as stationary bicycle.  She denies any chest pain, but does endorse exertional dyspnea with more than usual physical activity.  Patient has had longstanding hypertension and hyperlipidemia.  Blood pressure remains uncontrolled.  Recent CT scan showed 96 percentile coronary calcium, and pulmonary venous disease with mild interstitial edema.  Personally reviewed recent echocardiogram and CT chest.   Current Outpatient Medications:    acetaminophen (TYLENOL) 500 MG tablet, Take 1,000 mg by mouth every 6 (six) hours as needed for mild pain or headache., Disp: , Rfl:    amLODipine (NORVASC) 10 MG tablet, Take 10 mg by mouth daily. , Disp: , Rfl:    benazepril (LOTENSIN) 40 MG tablet, Take 40 mg by mouth at bedtime., Disp: , Rfl:    carvedilol (COREG) 3.125 MG tablet, Take 3.125 mg by mouth 2 (two) times daily with a meal., Disp: , Rfl:    Evolocumab (REPATHA SURECLICK) 140 MG/ML SOAJ, Inject 140 mg into the skin every 14 (fourteen) days., Disp: 6 mL, Rfl: 3   gabapentin (NEURONTIN) 300 MG capsule, Take 300 mg by mouth at bedtime., Disp: , Rfl:    hydrochlorothiazide (HYDRODIURIL) 25 MG tablet, Take 25 mg by mouth daily., Disp: , Rfl:    loperamide (IMODIUM) 2 MG capsule, Take 1 capsule (2 mg total) by mouth 2 (two) times daily. (Patient taking  differently: Take 4 mg by mouth in the morning and at bedtime.), Disp: 60 capsule, Rfl: 1   Menthol-Methyl Salicylate (MUSCLE RUB) 10-15 % CREA, Apply 1 application topically 2 (two) times daily as needed for muscle pain., Disp: , Rfl:    Multiple Vitamin (MULTIVITAMIN WITH MINERALS) TABS tablet, Take 1 tablet by mouth daily. Woman's one a day, Disp: , Rfl:    pantoprazole (PROTONIX) 40 MG tablet, Take 40 mg by mouth 2 (two) times daily before a meal. , Disp: , Rfl:    psyllium (HYDROCIL/METAMUCIL) 95 % PACK, Take 1 packet by mouth daily. (Patient taking differently: Take 1 packet by mouth 3 (three) times daily.), Disp: 30 packet, Rfl: 1   REPATHA SURECLICK 140 MG/ML SOAJ, Inject 140 mg into the skin every 14 (fourteen) days., Disp: , Rfl:    traMADol (ULTRAM) 50 MG tablet, Take 50 mg by mouth every 6 (six) hours as needed for severe pain., Disp: , Rfl:    aspirin EC 81 MG tablet, Take 1 tablet (81 mg total) by mouth daily. Swallow whole., Disp: 90 tablet, Rfl: 3   calcium carbonate (OSCAL) 1500 (600 Ca) MG TABS tablet, Take 600 mg of elemental calcium by mouth 2 (two) times daily with a meal., Disp: , Rfl:    labetalol (NORMODYNE) 100 MG tablet, TAKE 1 TABLET TWICE DAILY (Patient not taking: Reported on 04/26/2022), Disp: 180 tablet, Rfl: 3   Cardiovascular & other pertient studies:  Reviewed external labs and  tests, independently interpreted  EKG 04/26/2022: Sinus rhythm 65 bpm LVH Nonspecific T-abnormality  Lexiscan  Nuclear stress test 04/17/2021:  Nondiagnostic ECG stress.The heart rate response was consistent with Lexiscan.  Myocardial perfusion is normal. Overall LV systolic function is normal without regional wall motion abnormalities. Stress LV EF: 70%.  No previous exam available for comparison. Low risk.    Renal artery duplex  03/31/2021:  No evidence of renal artery occlusive disease in either renal artery.  Resistivity index upper normal in bilateral kidneys. Normal sized  kidneys.  Simple cyst 1cm2 noted in left kidney.  Normal appearing abdominal aorta not fully evaluated.   Echocardiogram 03/13/2021:  Left ventricle cavity is normal in size and wall thickness. Normal global  wall motion. Normal LV systolic function with EF 56%. Indeterminate  diastolic filling pattern.  Left atrial cavity is mildly dilated.  Mild (Grade I) mitral regurgitation.  Mild tricuspid regurgitation.  Mild pulmonic regurgitation.  No evidence of pulmonary hypertension.  CT cardiac scoring 02/22/2021: 1. Coronary calcium score of 866 is at the 96th percentile for the patient's age, sex and race. 2. Atherosclerosis of the thoracic aorta. 3. Pulmonary venous hypertension and potentially mild pulmonary interstitial edema.  LM: 185 LAD: 304 Lcx: 96 RCA: 280  Recent labs: 02/23/2021: Glucose 110, BUN/Cr 24/1.25. EGFR 49. Na/K 144/4.5. Rest of the CMP normal H/H 11.5/34.2. MCV 87. Platelets 239 HbA1C 5.7% Chol 133, TG 76, HDL 67, LDL 51   Latest Reference Range & Units 04/13/21 08:01  ALDOSTERONE 0.0 - 30.0 ng/dL 40.9  Renin 8.119 - 1.478 ng/mL/hr 1.149  ALDOS/RENIN RATIO 0.0 - 30.0  10.6     Review of Systems  Constitutional: Positive for malaise/fatigue.  Cardiovascular:  Positive for dyspnea on exertion. Negative for chest pain, leg swelling, palpitations and syncope.         Vitals:   04/26/22 0822  BP: (!) 146/70  Pulse: 68  SpO2: 98%    Body mass index is 33.66 kg/m. Filed Weights   04/26/22 0822  Weight: 190 lb (86.2 kg)     Objective:   Physical Exam Vitals and nursing note reviewed.  Constitutional:      General: She is not in acute distress. Neck:     Vascular: No JVD.  Cardiovascular:     Rate and Rhythm: Normal rate and regular rhythm.     Heart sounds: Normal heart sounds. No murmur heard. Pulmonary:     Effort: Pulmonary effort is normal.     Breath sounds: Normal breath sounds. No wheezing or rales.  Musculoskeletal:     Right  lower leg: No edema.     Left lower leg: No edema.             Visit diagnoses:   ICD-10-CM   1. Dyspnea on exertion  R06.09 EKG 12-Lead    2. Fatigue, unspecified type  R53.83 Ambulatory referral to Sleep Studies    TSH    Basic metabolic panel    Lipid panel    Lipoprotein A (LPA)    CBC    3. Elevated coronary artery calcium score  R93.1 Lipid panel    Lipoprotein A (LPA)         Assessment & Recommendations:    77 y.o. African American female with hypertension, mixed hyperlipidemia, elevated coronary calcium score (96th percentile), pulmonary venous hypertension, noted on CT scan 02/2021.  Exertional dyspnea: No ischemia on stress testing (04/2021). Likely related to hypertension and deconditioning. Continue medical management, given multivessel  coronary calcium.  In absence of bleeding, continue aspirin, Repatha. Check lipid panel.  Hypertension: Secondary workup negative. Better controlled at home. No change made to antihypertensive therapy today. Also suspect she may have obstructive sleep apnea as underlying cause for hypertension and fatigue. Ordered sleep study.  Fatigue: Unlikely cardiac in origin.  Check TSH and refer for sleep study.    F/u in 1 year     Elder Negus, MD Pager: [phone] Office: [phone]

## 2022-04-30 DIAGNOSIS — E785 Hyperlipidemia, unspecified: Secondary | ICD-10-CM | POA: Diagnosis not present

## 2022-04-30 DIAGNOSIS — R5383 Other fatigue: Secondary | ICD-10-CM | POA: Diagnosis not present

## 2022-04-30 DIAGNOSIS — R931 Abnormal findings on diagnostic imaging of heart and coronary circulation: Secondary | ICD-10-CM | POA: Diagnosis not present

## 2022-05-01 ENCOUNTER — Other Ambulatory Visit (HOSPITAL_COMMUNITY): Payer: Self-pay

## 2022-05-01 ENCOUNTER — Other Ambulatory Visit: Payer: Self-pay

## 2022-05-01 MED ORDER — REPATHA SURECLICK 140 MG/ML ~~LOC~~ SOAJ
SUBCUTANEOUS | 3 refills | Status: DC
  Filled 2022-05-01: qty 6, 84d supply, fill #0
  Filled 2022-05-01: qty 2, 28d supply, fill #0
  Filled 2022-05-23: qty 2, 28d supply, fill #1
  Filled 2022-06-20: qty 2, 28d supply, fill #2
  Filled 2022-07-19: qty 2, 28d supply, fill #3
  Filled 2022-08-16: qty 2, 28d supply, fill #4
  Filled 2022-09-13: qty 2, 28d supply, fill #5
  Filled 2022-10-11: qty 2, 28d supply, fill #6
  Filled 2022-11-08: qty 2, 28d supply, fill #7
  Filled 2022-12-06: qty 2, 28d supply, fill #8
  Filled 2023-01-05: qty 2, 28d supply, fill #9
  Filled 2023-01-24: qty 2, 28d supply, fill #10
  Filled 2023-03-01: qty 2, 28d supply, fill #11

## 2022-05-02 ENCOUNTER — Other Ambulatory Visit: Payer: Self-pay

## 2022-05-02 ENCOUNTER — Other Ambulatory Visit (HOSPITAL_COMMUNITY): Payer: Self-pay

## 2022-05-02 DIAGNOSIS — M67912 Unspecified disorder of synovium and tendon, left shoulder: Secondary | ICD-10-CM | POA: Diagnosis not present

## 2022-05-02 DIAGNOSIS — M25512 Pain in left shoulder: Secondary | ICD-10-CM | POA: Diagnosis not present

## 2022-05-02 DIAGNOSIS — M25511 Pain in right shoulder: Secondary | ICD-10-CM | POA: Diagnosis not present

## 2022-05-02 DIAGNOSIS — M67911 Unspecified disorder of synovium and tendon, right shoulder: Secondary | ICD-10-CM | POA: Diagnosis not present

## 2022-05-02 LAB — CBC
Hematocrit: 34.7 % (ref 34.0–46.6)
Hemoglobin: 11 g/dL — ABNORMAL LOW (ref 11.1–15.9)
MCH: 29.4 pg (ref 26.6–33.0)
MCHC: 31.7 g/dL (ref 31.5–35.7)
MCV: 93 fL (ref 79–97)
Platelets: 251 10*3/uL (ref 150–450)
RBC: 3.74 x10E6/uL — ABNORMAL LOW (ref 3.77–5.28)
RDW: 13.8 % (ref 11.7–15.4)
WBC: 3.9 10*3/uL (ref 3.4–10.8)

## 2022-05-02 LAB — TSH: TSH: 2.95 u[IU]/mL (ref 0.450–4.500)

## 2022-05-02 LAB — BASIC METABOLIC PANEL
BUN/Creatinine Ratio: 18 (ref 12–28)
BUN: 24 mg/dL (ref 8–27)
CO2: 22 mmol/L (ref 20–29)
Calcium: 9.8 mg/dL (ref 8.7–10.3)
Chloride: 107 mmol/L — ABNORMAL HIGH (ref 96–106)
Creatinine, Ser: 1.36 mg/dL — ABNORMAL HIGH (ref 0.57–1.00)
Glucose: 90 mg/dL (ref 70–99)
Potassium: 4.3 mmol/L (ref 3.5–5.2)
Sodium: 144 mmol/L (ref 134–144)
eGFR: 41 mL/min/{1.73_m2} — ABNORMAL LOW (ref >59)

## 2022-05-02 LAB — LIPID PANEL
Chol/HDL Ratio: 2 ratio (ref 0.0–4.4)
Cholesterol, Total: 128 mg/dL (ref 100–199)
HDL: 65 mg/dL (ref >39)
LDL Chol Calc (NIH): 42 mg/dL (ref 0–99)
Triglycerides: 117 mg/dL (ref 0–149)
VLDL Cholesterol Cal: 21 mg/dL (ref 5–40)

## 2022-05-02 LAB — LIPOPROTEIN A (LPA): Lipoprotein (a): 68.1 nmol/L (ref <75.0)

## 2022-05-04 DIAGNOSIS — K2930 Chronic superficial gastritis without bleeding: Secondary | ICD-10-CM | POA: Diagnosis not present

## 2022-05-04 DIAGNOSIS — K2970 Gastritis, unspecified, without bleeding: Secondary | ICD-10-CM | POA: Diagnosis not present

## 2022-05-04 DIAGNOSIS — R1310 Dysphagia, unspecified: Secondary | ICD-10-CM | POA: Diagnosis not present

## 2022-05-04 DIAGNOSIS — K219 Gastro-esophageal reflux disease without esophagitis: Secondary | ICD-10-CM | POA: Diagnosis not present

## 2022-05-04 DIAGNOSIS — K222 Esophageal obstruction: Secondary | ICD-10-CM | POA: Diagnosis not present

## 2022-05-07 NOTE — Progress Notes (Signed)
Normal thyroid function. Low normal hemoglobin, better than last 2 years. Creatinine, marker of kidney function has increased from 0.98 3 years ago to 1.36. This may just be progressive increase in Creatinine (reduction in kidney function), but recommend checking with PCP to compare with more recent results. Avoid NSAIDS (ibuprofen, Advil etc) as they can hurt the kidnneys, Low dose tylenol ok for as needed use for pain.  Thanks MJP

## 2022-05-08 NOTE — Progress Notes (Signed)
Gave patient results, she acknowledged understanding and agreed to talk to PCP about creatinine levels

## 2022-05-10 DIAGNOSIS — K2930 Chronic superficial gastritis without bleeding: Secondary | ICD-10-CM | POA: Diagnosis not present

## 2022-05-10 DIAGNOSIS — K219 Gastro-esophageal reflux disease without esophagitis: Secondary | ICD-10-CM | POA: Diagnosis not present

## 2022-05-23 ENCOUNTER — Other Ambulatory Visit (HOSPITAL_COMMUNITY): Payer: Self-pay

## 2022-05-24 DIAGNOSIS — K5190 Ulcerative colitis, unspecified, without complications: Secondary | ICD-10-CM | POA: Diagnosis not present

## 2022-05-24 DIAGNOSIS — Z932 Ileostomy status: Secondary | ICD-10-CM | POA: Diagnosis not present

## 2022-05-30 ENCOUNTER — Other Ambulatory Visit (HOSPITAL_COMMUNITY): Payer: Self-pay

## 2022-05-31 ENCOUNTER — Ambulatory Visit: Payer: Medicare HMO | Admitting: Neurology

## 2022-05-31 ENCOUNTER — Encounter: Payer: Self-pay | Admitting: Neurology

## 2022-05-31 VITALS — BP 111/55 | HR 62 | Ht 63.0 in | Wt 187.0 lb

## 2022-05-31 DIAGNOSIS — R0609 Other forms of dyspnea: Secondary | ICD-10-CM | POA: Diagnosis not present

## 2022-05-31 DIAGNOSIS — G4719 Other hypersomnia: Secondary | ICD-10-CM | POA: Diagnosis not present

## 2022-05-31 DIAGNOSIS — Z9189 Other specified personal risk factors, not elsewhere classified: Secondary | ICD-10-CM

## 2022-05-31 DIAGNOSIS — Z820 Family history of epilepsy and other diseases of the nervous system: Secondary | ICD-10-CM

## 2022-05-31 DIAGNOSIS — R351 Nocturia: Secondary | ICD-10-CM | POA: Diagnosis not present

## 2022-05-31 DIAGNOSIS — E669 Obesity, unspecified: Secondary | ICD-10-CM

## 2022-05-31 DIAGNOSIS — E66811 Obesity, class 1: Secondary | ICD-10-CM

## 2022-05-31 NOTE — Patient Instructions (Signed)

## 2022-05-31 NOTE — Progress Notes (Signed)
Subjective:    Patient ID: Donna [name] is a 77 y.o. female.  HPI    Huston Foley, MD, PhD [facility] [address], [address] [address] [place], [place] [address]  Dear Dr. Rosemary Holms,  I saw your patient, Donna [name], upon your kind request in my sleep clinic today for initial consultation of her sleep disorder, in particular, concern for underlying obstructive sleep apnea.  The patient is unaccompanied today.  As you know, Donna [name] is a 77 year old female with an underlying medical history of hypertension, hyperlipidemia, exertional dyspnea, arthritis, with s/p L TKA, LBP, colon cancer, reflux disease, prediabetes, ulcerative colitis with s/p total colectomy, and mild obesity, who reports sleep disruption and excessive daytime somnolence.  Her Epworth sleepiness score is 11 out of 24, fatigue severity score is 43 out of 63.  I reviewed your office note from 04/26/2022.  Patient has sleep apnea and uses a CPAP machine.  She has a variable sleep schedule, generally goes to bed before midnight and rise time is around 8 but she is often awake in the middle of the night and has trouble maintaining sleep.  She has a colostomy bag and empties it once or twice per night and also urinates about twice or once per night on average.  She denies recurrent nocturnal or morning headaches.  She lives with her middle son, her oldest son has sleep apnea.  She has a total of 3 sons.  She works for [employer] for over 44 years.  She is divorced.  She is a non-smoker and does not drink any alcohol, she limits her caffeine to 1 cup of coffee per day on average.   Her Past Medical History Is Significant For: Past Medical History:  Diagnosis Date   Arthritis    Bulging lumbar disc    Cancer (HCC)    colon   Degeneration of lumbar or lumbosacral intervertebral disc 01/21/2015   Essential hypertension 01/21/2015   GERD (gastroesophageal reflux disease)    GERD (gastroesophageal  reflux disease) 01/21/2015   Heart murmur    Hyperlipidemia    Hyperlipidemia 01/21/2015   Hypertension    Pre-diabetes    diet   Ulcerative colitis (HCC)     Her Past Surgical History Is Significant For: Past Surgical History:  Procedure Laterality Date   CESAREAN SECTION     COLON SURGERY     FLEXIBLE SIGMOIDOSCOPY N/A 05/18/2020   Procedure: SURVEILLANCE FLEXIBLE SIGMOIDOSCOPY;  Surgeon: Andria Meuse, MD;  Location: [facility];  Service: General;  Laterality: N/A;   INCISIONAL HERNIA REPAIR N/A 05/29/2019   Procedure: PRIMARY INCISIONAL HERNIA REPAIR;  Surgeon: Andria Meuse, MD;  Location: [facility];  Service: General;  Laterality: N/A;   LAPAROTOMY Right 01/12/2015   Procedure: OPEN RIGHT COLECTOMY;  Surgeon: Emelia Loron, MD;  Location: [facility];  Service: General;  Laterality: Right;   RIGHT COLECTOMY  01/12/2015   TOTAL KNEE ARTHROPLASTY Left 06/20/2018   Procedure: TOTAL KNEE ARTHROPLASTY;  Surgeon: Jodi Geralds, MD;  Location: [facility];  Service: Orthopedics;  Laterality: Left;    Her Family History Is Significant For: Family History  Problem Relation Age of Onset   Diabetes Mother    Ulcerative colitis Father    Heart failure Sister    Sleep apnea Son     Her Social History Is Significant For: Social History   Socioeconomic History   Marital status: Divorced    Spouse name: Not on file   Number  of children: 3   Years of education: Not on file   Highest education level: Not on file  Occupational History   Not on file  Tobacco Use   Smoking status: Never   Smokeless tobacco: Never  Vaping Use   Vaping Use: Never used  Substance and Sexual Activity   Alcohol use: No   Drug use: No   Sexual activity: Not on file  Other Topics Concern   Not on file  Social History Narrative   Not on file   Social Determinants of Health   Financial Resource Strain: Not on file  Food Insecurity: No Food Insecurity (11/03/2021)   Hunger Vital Sign    Worried  About Running Out of Food in the Last Year: Never true    Ran Out of Food in the Last Year: Never true  Transportation Needs: No Transportation Needs (08/02/2021)   PRAPARE - Administrator, Civil Service (Medical): No    Lack of Transportation (Non-Medical): No  Physical Activity: Not on file  Stress: Not on file  Social Connections: Not on file    Her Allergies Are:  Allergies  Allergen Reactions   Iodinated Contrast Media Other (See Comments)    Feels faint  Other Reaction(s): coughing, bp elevated   Mesalamine Other (See Comments)   Ozempic (0.25 Or 0.5 Mg-Dose) [Semaglutide(0.25 Or 0.5mg -Dos)] Nausea And Vomiting   Prilosec [Omeprazole]     Made knees hurt and made me sick   Statins Hives and Other (See Comments)    Muscle spasms   :   Her Current Medications Are:  Outpatient Encounter Medications as of 05/31/2022  Medication Sig   acetaminophen (TYLENOL) 500 MG tablet Take 1,000 mg by mouth every 6 (six) hours as needed for mild pain or headache.   amLODipine (NORVASC) 10 MG tablet Take 10 mg by mouth daily.    aspirin EC 81 MG tablet Take 1 tablet (81 mg total) by mouth daily. Swallow whole.   B Complex Vitamins (B COMPLEX 100 PO) B Complex 100   benazepril (LOTENSIN) 40 MG tablet Take 40 mg by mouth at bedtime.   calcium carbonate (OSCAL) 1500 (600 Ca) MG TABS tablet Take 600 mg of elemental calcium by mouth 2 (two) times daily with a meal.   Evolocumab (REPATHA SURECLICK) 140 MG/ML SOAJ Inject 140 mg into the skin every 14 (fourteen) days.   Evolocumab (REPATHA SURECLICK) 140 MG/ML SOAJ Inject 140 mg (1 pen) into the skin every 14 days   hydrochlorothiazide (HYDRODIURIL) 25 MG tablet Take 25 mg by mouth daily.   labetalol (NORMODYNE) 100 MG tablet TAKE 1 TABLET TWICE DAILY   loperamide (IMODIUM) 2 MG capsule Take 1 capsule (2 mg total) by mouth 2 (two) times daily. (Patient taking differently: Take 4 mg by mouth in the morning and at bedtime.)    Menthol-Methyl Salicylate (MUSCLE RUB) 10-15 % CREA Apply 1 application topically 2 (two) times daily as needed for muscle pain.   Multiple Vitamin (MULTIVITAMIN WITH MINERALS) TABS tablet Take 1 tablet by mouth daily. Woman's one a day   pantoprazole (PROTONIX) 40 MG tablet Take 40 mg by mouth 2 (two) times daily before a meal.    psyllium (HYDROCIL/METAMUCIL) 95 % PACK Take 1 packet by mouth daily. (Patient taking differently: Take 1 packet by mouth 3 (three) times daily.)   REPATHA SURECLICK 140 MG/ML SOAJ Inject 140 mg into the skin every 14 (fourteen) days.   traMADol (ULTRAM) 50 MG tablet Take 50  mg by mouth every 6 (six) hours as needed for severe pain.   carvedilol (COREG) 3.125 MG tablet Take 3.125 mg by mouth 2 (two) times daily with a meal. (Patient not taking: Reported on 05/31/2022)   gabapentin (NEURONTIN) 300 MG capsule Take 300 mg by mouth at bedtime.   No facility-administered encounter medications on file as of 05/31/2022.  :   Review of Systems:  Out of a complete 14 point review of systems, all are reviewed and negative with the exception of these symptoms as listed below:  Review of Systems  Neurological:        Pt here for sleep  consult  Pt states fatigue,hypertension, Pt denies snoring,headaches,sleep study.CPAP   ESS:11 FSS:43     Objective:  Neurological Exam  Physical Exam Physical Examination:   Vitals:   05/31/22 1351  BP: (!) 111/55  Pulse: 62    General Examination: The patient is a very pleasant 77 y.o. female in no acute distress. She appears well-developed and well-nourished and well groomed.   HEENT: Normocephalic, atraumatic, pupils are equal, round and reactive to light, corrective eyeglasses in place.  Extraocular tracking is good without limitation to gaze excursion or nystagmus noted. Hearing is grossly intact. Face is symmetric with normal facial animation. Speech is clear with no dysarthria noted. There is no hypophonia. There is no lip,  neck/head, jaw or voice tremor. Neck is supple with full range of passive and active motion. There are no carotid bruits on auscultation. Oropharynx exam reveals: mild mouth dryness, adequate dental hygiene with some missing teeth, moderate airway crowding secondary to small airway entry and widened uvula.  Thicker tongue noted, Mallampati class III.  Tonsils on the smaller side, neck circumference 14 and three-quarter inches.  Mild overbite noted.  Tongue protrudes centrally and palate elevates symmetrically.   Chest: Clear to auscultation without wheezing, rhonchi or crackles noted.  Heart: S1+S2+0, regular and normal without murmurs, rubs or gallops noted.   Abdomen: Soft, non-tender and non-distended.  Extremities: There is no pitting edema in the distal lower extremities bilaterally.   Skin: Warm and dry without trophic changes noted.   Musculoskeletal: exam reveals no obvious joint deformities.   Neurologically:  Mental status: The patient is awake, alert and oriented in all 4 spheres. Her immediate and remote memory, attention, language skills and fund of knowledge are appropriate. There is no evidence of aphasia, agnosia, apraxia or anomia. Speech is clear with normal prosody and enunciation. Thought process is linear. Mood is normal and affect is normal.  Cranial nerves II - XII are as described above under HEENT exam.  Motor exam: Normal bulk, strength and tone is noted. There is no obvious action or resting tremor.  Fine motor skills and coordination: grossly intact.  Cerebellar testing: No dysmetria or intention tremor. There is no truncal or gait ataxia.  Sensory exam: intact to light touch in the upper and lower extremities.  Gait, station and balance: She stands easily. No veering to one side is noted. No leaning to one side is noted. Posture is age-appropriate and stance is narrow based. Gait shows normal stride length and normal pace. No problems turning are noted.    Assessment and Plan:  In summary, Mirandah Slomski is a very pleasant 77 y.o.-year old female with an underlying medical history of hypertension, hyperlipidemia, exertional dyspnea, arthritis, with s/p L TKA, LBP, colon cancer, reflux disease, prediabetes, ulcerative colitis with s/p total colectomy, and mild obesity, whose history and physical exam  are concerning for sleep disordered breathing, particularly obstructive sleep apnea (OSA). A laboratory attended sleep study is typically considered "gold standard" for evaluation of sleep disordered breathing.   I had a long chat with the patient about my findings and the diagnosis of sleep apnea, particularly OSA, its prognosis and treatment options. We talked about medical/conservative treatments, surgical interventions and non-pharmacological approaches for symptom control. I explained, in particular, the risks and ramifications of untreated moderate to severe OSA, especially with respect to developing cardiovascular disease down the road, including congestive heart failure (CHF), difficult to treat hypertension, cardiac arrhythmias (particularly A-fib), neurovascular complications including TIA, stroke and dementia. Even type 2 diabetes has, in part, been linked to untreated OSA. Symptoms of untreated OSA may include (but may not be limited to) daytime sleepiness, nocturia (i.e. frequent nighttime urination), memory problems, mood irritability and suboptimally controlled or worsening mood disorder such as depression and/or anxiety, lack of energy, lack of motivation, physical discomfort, as well as recurrent headaches, especially morning or nocturnal headaches. We talked about the importance of maintaining a healthy lifestyle and striving for healthy weight. In addition, we talked about the importance of striving for and maintaining good sleep hygiene. I recommended a sleep study at this time. I outlined the differences between a laboratory attended sleep  study which is considered more comprehensive and accurate over the option of a home sleep test (HST); the latter may lead to underestimation of sleep disordered breathing in some instances and does not help with diagnosing upper airway resistance syndrome and is not accurate enough to diagnose primary central sleep apnea typically. I outlined possible surgical and non-surgical treatment options of OSA, including the use of a positive airway pressure (PAP) device (i.e. CPAP, AutoPAP/APAP or BiPAP in certain circumstances), a custom-made dental device (aka oral appliance, which would require a referral to a specialist dentist or orthodontist typically, and is generally speaking not considered for patients with full dentures or edentulous state), upper airway surgical options, such as traditional UPPP (which is not considered a first-line treatment) or the Inspire device (hypoglossal nerve stimulator, which would involve a referral for consultation with an ENT surgeon, after careful selection, following inclusion criteria - also not first-line treatment). I explained the PAP treatment option to the patient in detail, as this is generally considered first-line treatment.  The patient indicated that she would be willing to try PAP therapy, if the need arises. I explained the importance of being compliant with PAP treatment, not only for insurance purposes but primarily to improve patient's symptoms symptoms, and for the patient's long term health benefit, including to reduce Her cardiovascular risks longer-term.    We will pick up our discussion about the next steps and treatment options after testing.  We will keep her posted as to the test results by phone call and/or MyChart messaging where possible.  We will plan to follow-up in sleep clinic accordingly as well.  I answered all her questions today and the patient was in agreement.   I encouraged her to call with any interim questions, concerns, problems or  updates or email [place] through MyChart.  Generally speaking, sleep test authorizations may take up to 2 weeks, sometimes less, sometimes longer, the patient is encouraged to get in touch with [place] if they do not hear back from the sleep lab staff directly within the next 2 weeks.  Thank you very much for allowing me to participate in the care of this nice patient. If I can be of  any further assistance to you please do not hesitate to call me at [phone].  Sincerely,   Huston Foley, MD, PhD

## 2022-06-13 ENCOUNTER — Telehealth: Payer: Self-pay | Admitting: Neurology

## 2022-06-13 NOTE — Telephone Encounter (Signed)
[employer] pending uploaded notes 

## 2022-06-18 NOTE — Telephone Encounter (Signed)
Checked status on the portal it is still pending.  

## 2022-06-18 NOTE — Telephone Encounter (Signed)
Donna [name]: [id number] (exp. 06/13/22 to 09/11/22)  for NPSG

## 2022-06-20 ENCOUNTER — Other Ambulatory Visit: Payer: Self-pay

## 2022-06-20 ENCOUNTER — Other Ambulatory Visit (HOSPITAL_COMMUNITY): Payer: Self-pay

## 2022-06-20 NOTE — Progress Notes (Signed)
COVID Vaccine Completed:  Date of COVID positive in last 90 days:  No  PCP - Merri Brunette, MD Cardiologist - Truett Mainland, MD Neurologist - Huston Foley, MD  Chest x-ray - N/A EKG - 04-26-22 Epic Stress Test - 04-17-21 Epic ECHO - 03-13-21 Epic Cardiac Cath - N/A Pacemaker/ICD device last checked: Spinal Cord Stimulator:  Cardiac CT - 02-22-21 Epic  Bowel Prep - N/A  Sleep Study - States that she is being scheduled for one, does not have date. CPAP - No  Prediabetes Fasting Blood Sugar -  Checks Blood Sugar - does not check   Last dose of GLP1 agonist-  N/A GLP1 instructions:  N/A   Last dose of SGLT-2 inhibitors-  N/A SGLT-2 instructions: N/A   Blood Thinner Instructions:  Time Aspirin Instructions:  ASA 81.  Pt has instructions from surgeon's office regarding what medicines she needs to hold. Last Dose:  Activity level:  Can go up a flight of stairs and perform activities of daily living without stopping and without symptoms of chest pain  Patient does have dyspnea on exertion at times, has not worsened.  Able to exercise.    Anesthesia review: Murmur, elevated coronary calcium score, HTN, pulmonary HTN noted on CT, dyspnea on exertion  Patient denies shortness of breath, fever, cough and chest pain at PAT appointment (completed over the phone)  Patient verbalized understanding of instructions that were given to them at the PAT appointment. Patient was also instructed that they will need to review over the PAT instructions again at home before surgery.

## 2022-06-22 DIAGNOSIS — K5190 Ulcerative colitis, unspecified, without complications: Secondary | ICD-10-CM | POA: Diagnosis not present

## 2022-06-22 DIAGNOSIS — Z932 Ileostomy status: Secondary | ICD-10-CM | POA: Diagnosis not present

## 2022-06-26 ENCOUNTER — Other Ambulatory Visit (HOSPITAL_COMMUNITY): Payer: Self-pay

## 2022-06-28 ENCOUNTER — Other Ambulatory Visit (HOSPITAL_COMMUNITY): Payer: Self-pay

## 2022-06-29 ENCOUNTER — Encounter (HOSPITAL_COMMUNITY)
Admission: RE | Disposition: A | Discharge status: Home / Self Care | Payer: Self-pay | Source: Ambulatory Visit | Attending: Surgery

## 2022-06-29 ENCOUNTER — Ambulatory Visit (HOSPITAL_COMMUNITY): Payer: Medicare HMO | Admitting: Certified Registered"

## 2022-06-29 ENCOUNTER — Other Ambulatory Visit: Payer: Self-pay

## 2022-06-29 ENCOUNTER — Encounter (HOSPITAL_COMMUNITY): Payer: Self-pay | Admitting: Surgery

## 2022-06-29 ENCOUNTER — Ambulatory Visit (HOSPITAL_COMMUNITY)
Admission: RE | Admit: 2022-06-29 | Discharge: 2022-06-29 | Disposition: A | Discharge status: Home / Self Care | Payer: Medicare HMO | Source: Ambulatory Visit | Attending: Surgery | Admitting: Surgery

## 2022-06-29 ENCOUNTER — Ambulatory Visit (HOSPITAL_BASED_OUTPATIENT_CLINIC_OR_DEPARTMENT_OTHER): Payer: Medicare HMO | Admitting: Certified Registered"

## 2022-06-29 DIAGNOSIS — Z9049 Acquired absence of other specified parts of digestive tract: Secondary | ICD-10-CM | POA: Diagnosis not present

## 2022-06-29 DIAGNOSIS — I2510 Atherosclerotic heart disease of native coronary artery without angina pectoris: Secondary | ICD-10-CM | POA: Diagnosis not present

## 2022-06-29 DIAGNOSIS — K219 Gastro-esophageal reflux disease without esophagitis: Secondary | ICD-10-CM | POA: Diagnosis not present

## 2022-06-29 DIAGNOSIS — M1990 Unspecified osteoarthritis, unspecified site: Secondary | ICD-10-CM | POA: Diagnosis not present

## 2022-06-29 DIAGNOSIS — K51818 Other ulcerative colitis with other complication: Secondary | ICD-10-CM | POA: Diagnosis not present

## 2022-06-29 DIAGNOSIS — Z79899 Other long term (current) drug therapy: Secondary | ICD-10-CM | POA: Diagnosis not present

## 2022-06-29 DIAGNOSIS — Z09 Encounter for follow-up examination after completed treatment for conditions other than malignant neoplasm: Secondary | ICD-10-CM | POA: Diagnosis not present

## 2022-06-29 DIAGNOSIS — D374 Neoplasm of uncertain behavior of colon: Secondary | ICD-10-CM | POA: Diagnosis not present

## 2022-06-29 DIAGNOSIS — K51919 Ulcerative colitis, unspecified with unspecified complications: Secondary | ICD-10-CM | POA: Insufficient documentation

## 2022-06-29 DIAGNOSIS — G8929 Other chronic pain: Secondary | ICD-10-CM | POA: Insufficient documentation

## 2022-06-29 DIAGNOSIS — K5190 Ulcerative colitis, unspecified, without complications: Secondary | ICD-10-CM

## 2022-06-29 DIAGNOSIS — E785 Hyperlipidemia, unspecified: Secondary | ICD-10-CM | POA: Insufficient documentation

## 2022-06-29 DIAGNOSIS — Z932 Ileostomy status: Secondary | ICD-10-CM | POA: Diagnosis not present

## 2022-06-29 DIAGNOSIS — I10 Essential (primary) hypertension: Secondary | ICD-10-CM | POA: Insufficient documentation

## 2022-06-29 DIAGNOSIS — D126 Benign neoplasm of colon, unspecified: Secondary | ICD-10-CM | POA: Diagnosis not present

## 2022-06-29 DIAGNOSIS — M549 Dorsalgia, unspecified: Secondary | ICD-10-CM | POA: Insufficient documentation

## 2022-06-29 HISTORY — PX: BIOPSY: SHX5522

## 2022-06-29 HISTORY — PX: FLEXIBLE SIGMOIDOSCOPY: SHX5431

## 2022-06-29 SURGERY — SIGMOIDOSCOPY, FLEXIBLE
Anesthesia: Monitor Anesthesia Care

## 2022-06-29 MED ORDER — LIDOCAINE HCL (CARDIAC) PF 100 MG/5ML IV SOSY
PREFILLED_SYRINGE | INTRAVENOUS | Status: DC | PRN
  Administered 2022-06-29: 80 mg via INTRATRACHEAL

## 2022-06-29 MED ORDER — LACTATED RINGERS IV SOLN
INTRAVENOUS | Status: DC
  Administered 2022-06-29: 1000 mL via INTRAVENOUS

## 2022-06-29 MED ORDER — LACTATED RINGERS IV SOLN: INTRAVENOUS | Status: DC | PRN

## 2022-06-29 MED ORDER — SODIUM CHLORIDE 0.9 % IV SOLN: INTRAVENOUS | Status: DC

## 2022-06-29 MED ORDER — PROPOFOL 500 MG/50ML IV EMUL
INTRAVENOUS | Status: DC | PRN
  Administered 2022-06-29: 100 ug/kg/min via INTRAVENOUS

## 2022-06-29 NOTE — Anesthesia Postprocedure Evaluation (Signed)
Anesthesia Post Note  Patient: Donna [name]  Procedure(s) Performed: FLEXIBLE SIGMOIDOSCOPY BIOPSY     Patient location during evaluation: Endoscopy Anesthesia Type: MAC Level of consciousness: awake and alert Pain management: pain level controlled Vital Signs Assessment: post-procedure vital signs reviewed and stable Respiratory status: spontaneous breathing, nonlabored ventilation, respiratory function stable and patient connected to nasal cannula oxygen Cardiovascular status: stable and blood pressure returned to baseline Postop Assessment: no apparent nausea or vomiting Anesthetic complications: no   No notable events documented.  Last Vitals:  Vitals:   06/29/22 1130 06/29/22 1138  BP: (!) 114/53 117/65  Pulse: 60 (!) 59  Resp: 14 15  Temp:    SpO2: 99% 96%    Last Pain:  Vitals:   06/29/22 1138  TempSrc:   PainSc: 0-No pain                 Earl Lites P Shaylin Blatt

## 2022-06-29 NOTE — Op Note (Signed)
[facility] Patient Name: Donna [name] Procedure Date: 06/29/2022 MRN: [id number] Attending MD: Andria Meuse MD, MD,  Date of Birth: [birth year] CSN: [id number] Age: 77 Admit Type: Outpatient Procedure:                Flexible Sigmoidoscopy Indications:              High risk colon cancer surveillance: Ulcerative                            colitis with known dysplasia (previous visible                            dysplastic lesion removed completely), Personal                            history of ulcerative colitis Providers:                Stephanie Coup. Roque Schill MD, MD, Marge Duncans, RN,                            Adin Hector, RN, Kandice Robinsons, Technician Referring MD:              Medicines:                Monitored Anesthesia Care Complications:            No immediate complications. Estimated blood loss:                            Minimal. Estimated Blood Loss:     Estimated blood loss was minimal. Procedure:                Pre-Anesthesia Assessment:                           - The anesthesia plan was to use monitored                            anesthesia care (MAC).                           After obtaining informed consent, the scope was                            passed under direct vision. The GIF-[id number]                            ([id number] Olympus slim endoscope was introduced                            through the anus and advanced to the the rectum.                            The flexible sigmoidoscopy was accomplished without                            difficulty. The patient tolerated the procedure  well. The quality of the bowel preparation was                            adequate. Scope In: 10:53:35 AM Scope Out: 10:57:39 AM Total Procedure Duration: 0 hours 4 minutes 4 seconds  Findings:      Low grade ring like stricture of distal anal canal just beneath dentate       - traversable with finger.      There was  evidence of an end pouch staple line in the distal rectum.       This was non-patent and was characterized by healthy appearing mucosa       and an intact staple line. Estimated blood loss was minimal. This was       biopsied with a cold forceps for histology. Verification of patient       identification for the specimen was done by the physician, nurse and       technician using the patient's name, birth date and medical record       number. Estimated blood loss was minimal. Impression:               - Distal rectal pouch characterized by healthy                            appearing mucosa and an intact staple line.                            Biopsied (anal transition zone). No masses or other                            abnormal findings Moderate Sedation:      Not Applicable - Patient had care per Anesthesia. Recommendation:           - Continue present medications.                           - Repeat flexible sigmoidoscopy in 1 year for                            surveillance. Procedure Code(s):        --- Professional ---                           [phone], 52, Sigmoidoscopy, flexible; with biopsy,                            single or multiple Diagnosis Code(s):        --- Professional ---                           K51.90, Ulcerative colitis, unspecified, without                            complications                           D12.6, Benign neoplasm of colon, unspecified CPT copyright 2022 [employer]. All rights reserved. The codes documented in this report  are preliminary and upon coder review may  be revised to meet current compliance requirements. Marin Olp, MD Andria Meuse MD, MD 06/29/2022 11:09:31 AM This report has been signed electronically. Number of Addenda: 0

## 2022-06-29 NOTE — H&P (Signed)
CC: Here today for surgery  HPI: Donna [name] is an 77 y.o. female with hx of HTN, HLD, GERD, osteoarthritis, in long-standing history of ulcerative colitis-53 year history dating back to when she was 77 years old. She denies ever having had a take steroids for this. She has been well controlled from the disease standpoint with sulfasalazine. She denies any history of infusional agents. She has undergone routine surveillance colonoscopies near annually for the last 30 years. She follows with Dr. Matthias Hughs currently. She does have chronic back pain. She denies any blood in her stool. She underwent another one of her routine surveillance colonoscopies 12/21/18 which demonstrated diffuse area of mildly erythematous mucosa in the sigmoid and descending and transverse. 6 mm sigmoid polyp found that was flat and removed with the cold snare. Area around the polyp was also biopsied separately. Area was tattooed with spot proximally and distally. 2 flat polyps in the rectum there were 4 mm that were removed. Anal stricture was also found. Pathology returned:  Inactive chronic colitis negative for dysplasia in the proximal colon-no granuloma or inclusions. 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 2. Mildly active chronic colitis negative for dysplasia in the mid colon 3. Low and high-grade dysplasia with focal area suspicious for invasion and the polyp that was removed from the sigmoid. DALM not excluded. 4. Tissue from around the polypectomy site returned with tubulovillous adenoma and low-grade dysplasia 5. Rectosigmoid biopsy returned mildly active chronic colitis negative for dysplasia.  Her pathology was sent to [facility] for a second pathologist review the findings. By report from Dr. Donavan Burnet office, concordance with our pathologist was maintained and this lesion is felt to represent a flat area of cancer involving the polyp with surrounding dysplasia that is  indistinct. This is not amenable to further polypectomy as per Dr.Buccini. She has been referred for consideration of proctocolectomy.  She has 3 children, one of whom was delivered vaginally. She denies any significant birth trauma seceding with this. She denies any history of incontinence to gas, liquid, or solid stool. Currently, she states she has had diarrhea most of her adult life 3-5 bowel movements per day. This is almost always liquid.  OR 05/29/19 - robotic total proctocolectomy with end ileostomy, lysis of adhesions, primary repair of incisional hernia. She recovered well postoperatively and was discharged home 06/04/19. Path - invasive adenocarcinoma, well-differentiated-pTisN0 (0/17 LNs). She has had some bag leaked issues but has home health in place and they're working to get her with a elastic band to assist with holding the bag in place given her habitus.  She returns for follow-up. Continues to take fiber-Metamucil and Imodium down now to ~TID as opposed to QID, her output has remained lower in volume and toothpaste in consistency. When she does not take these, it does become quite watery. No longer wearing ostomy belt but having no issues with bag leaks either. She denies fevers/chills/nausea/vomiting. She has been staying well-hydrated. She denies any complaints today. Flex sig 05/18/20 shows normal perianal exam, expected mild stenosis related to dis-use . Denies any rectal drainage or pain.   She denies any changes in health or health history since we met in the office. No new medications/allergies. She states she is ready for surgery today.  Past Medical History:  Diagnosis Date   Arthritis    Bulging lumbar disc    Cancer (HCC)    colon   Degeneration of lumbar or lumbosacral intervertebral disc 01/21/2015   Essential hypertension  01/21/2015   GERD (gastroesophageal reflux disease)    GERD (gastroesophageal reflux disease) 01/21/2015   Heart murmur    Hyperlipidemia     Hyperlipidemia 01/21/2015   Hypertension    Pre-diabetes    diet   Ulcerative colitis (HCC)     Past Surgical History:  Procedure Laterality Date   CESAREAN SECTION     COLON SURGERY     FLEXIBLE SIGMOIDOSCOPY N/A 05/18/2020   Procedure: SURVEILLANCE FLEXIBLE SIGMOIDOSCOPY;  Surgeon: Andria Meuse, MD;  Location: [facility];  Service: General;  Laterality: N/A;   INCISIONAL HERNIA REPAIR N/A 05/29/2019   Procedure: PRIMARY INCISIONAL HERNIA REPAIR;  Surgeon: Andria Meuse, MD;  Location: [facility];  Service: General;  Laterality: N/A;   LAPAROTOMY Right 01/12/2015   Procedure: OPEN RIGHT COLECTOMY;  Surgeon: Emelia Loron, MD;  Location: [facility];  Service: General;  Laterality: Right;   RIGHT COLECTOMY  01/12/2015   TOTAL KNEE ARTHROPLASTY Left 06/20/2018   Procedure: TOTAL KNEE ARTHROPLASTY;  Surgeon: Jodi Geralds, MD;  Location: [facility];  Service: Orthopedics;  Laterality: Left;    Family History  Problem Relation Age of Onset   Diabetes Mother    Ulcerative colitis Father    Heart failure Sister    Sleep apnea Son     Social:  reports that she has never smoked. She has never used smokeless tobacco. She reports that she does not drink alcohol and does not use drugs.  Allergies:  Allergies  Allergen Reactions   Iodinated Contrast Media Other (See Comments)    Feels faint  coughing, bp elevated   Mesalamine Other (See Comments)   Ozempic (0.25 Or 0.5 Mg-Dose) [Semaglutide(0.25 Or 0.5mg -Dos)] Nausea And Vomiting   Prilosec [Omeprazole]     Made knees hurt and made me sick   Statins Hives and Other (See Comments)    Muscle spasms     Medications: I have reviewed the patient's current medications.  No results found for this or any previous visit (from the past 48 hour(s)).  No results found.   PE Blood pressure (!) 144/80, pulse 62, temperature (!) 97.5 F (36.4 C), temperature source Temporal, resp. rate 19, height 5\' 3"  (1.6 m), weight 83.5 kg, SpO2 100  %. Constitutional: NAD; conversant Eyes: Moist conjunctiva; no lid lag; anicteric Lungs: Normal respiratory effort CV: RRR Psychiatric: Appropriate affect  No results found for this or any previous visit (from the past 48 hour(s)).  No results found.  A/P: Wing Peraino WGNFAO is an 76 y.o. female with long standing UC, HTN, HLD, GERD, back/left knee pain/arthritis - here for evaluation for total proctocolectomy in the setting of her most recent endoscopic exam 12/21/18 demonstrating a flat sigmoid polyp with at least carcinoma in situ if not an invasive component and surrounding adenomatous tissue (DALM) not amenable to further endoscopic resection due to indistinct margins.  -OR 05/29/19 - robotic total proctocolectomy with end ileostomy. Pathology returned pTisN0 (0/17)M0.  -She continues to do quite well -We again discussed diet as tolerated, working to avoid concentrated sweets - sugar and salt. Her ostomy output is well controlled with a good consistency output. We discussed continuing a fiber supplement daily or twice daily likely indefinitely and continuing her regimen with Imodium at this time -Appliance belt working well previously; may benefit from Convex appliance - saw [facility] team but did not feel the Convex appliance was any better. Prefers 2 piece flat.  She did not schedule her last flex sig, we have recommended scheduling  and discussed importance.  -Planning for surveillance of her rectal cuff - flexible sigmoidoscopy. She does have some narrowing preventing her tolerating a digital exam as well. Will benefit from MAC for this reason  -The planned procedure, material risks (including, but not limited to, pain, bleeding, perforation, need for additional procedures, pneumonia, heart attack, stroke, death) benefits and alternatives were reviewed.  Her questions were answered, she expressed understanding and wishes to proceed  Marin Olp, MD [facility]

## 2022-06-29 NOTE — Anesthesia Preprocedure Evaluation (Addendum)
Anesthesia Evaluation  Patient identified by MRN, date of birth, ID band Patient awake    Reviewed: Allergy & Precautions, NPO status , Patient's Chart, lab work & pertinent test results  Airway Mallampati: II  TM Distance: >3 FB Neck ROM: Full    Dental no notable dental hx.    Pulmonary neg pulmonary ROS   Pulmonary exam normal        Cardiovascular hypertension, Pt. on medications and Pt. on home beta blockers + CAD   Rhythm:Regular Rate:Normal     Neuro/Psych negative neurological ROS  negative psych ROS   GI/Hepatic Neg liver ROS, PUD,GERD  Medicated,,UC   Endo/Other  negative endocrine ROS    Renal/GU negative Renal ROS  negative genitourinary   Musculoskeletal  (+) Arthritis , Osteoarthritis,    Abdominal Normal abdominal exam  (+)   Peds  Hematology negative hematology ROS (+)   Anesthesia Other Findings   Reproductive/Obstetrics                             Anesthesia Physical Anesthesia Plan  ASA: 3  Anesthesia Plan: MAC   Post-op Pain Management:    Induction: Intravenous  PONV Risk Score and Plan: 2 and Propofol infusion and Treatment may vary due to age or medical condition  Airway Management Planned: Simple Face Mask and Nasal Cannula  Additional Equipment: None  Intra-op Plan:   Post-operative Plan:   Informed Consent: I have reviewed the patients History and Physical, chart, labs and discussed the procedure including the risks, benefits and alternatives for the proposed anesthesia with the patient or authorized representative who has indicated his/her understanding and acceptance.     Dental advisory given  Plan Discussed with: CRNA  Anesthesia Plan Comments:        Anesthesia Quick Evaluation

## 2022-06-29 NOTE — Transfer of Care (Signed)
Immediate Anesthesia Transfer of Care Note  Patient: Donna [name]  Procedure(s) Performed: FLEXIBLE SIGMOIDOSCOPY BIOPSY  Patient Location: PACU  Anesthesia Type:MAC  Level of Consciousness: awake and alert   Airway & Oxygen Therapy: Patient Spontanous Breathing and Patient connected to nasal cannula oxygen  Post-op Assessment: Report given to RN and Post -op Vital signs reviewed and stable  Post vital signs: Reviewed and stable  Last Vitals:  Vitals Value Taken Time  BP    Temp    Pulse 62 06/29/22 1106  Resp 23 06/29/22 1106  SpO2 100 % 06/29/22 1106  Vitals shown include unvalidated device data.  Last Pain:  Vitals:   06/29/22 1021  TempSrc: Temporal  PainSc: 0-No pain         Complications: No notable events documented.

## 2022-07-01 ENCOUNTER — Encounter (HOSPITAL_COMMUNITY): Payer: Self-pay | Admitting: Surgery

## 2022-07-02 LAB — SURGICAL PATHOLOGY

## 2022-07-03 ENCOUNTER — Other Ambulatory Visit: Payer: Self-pay | Admitting: Internal Medicine

## 2022-07-03 DIAGNOSIS — Z1231 Encounter for screening mammogram for malignant neoplasm of breast: Secondary | ICD-10-CM

## 2022-07-10 NOTE — Telephone Encounter (Signed)
Updated Ethlyn Gallery: [id number] (exp. 07/02/22 to 10/30/22)   Donna [name] message to the patient to schedule NPSG.

## 2022-07-19 ENCOUNTER — Other Ambulatory Visit (HOSPITAL_COMMUNITY): Payer: Self-pay

## 2022-07-24 ENCOUNTER — Other Ambulatory Visit (HOSPITAL_COMMUNITY): Payer: Self-pay

## 2022-07-26 DIAGNOSIS — Z932 Ileostomy status: Secondary | ICD-10-CM | POA: Diagnosis not present

## 2022-07-26 DIAGNOSIS — K5190 Ulcerative colitis, unspecified, without complications: Secondary | ICD-10-CM | POA: Diagnosis not present

## 2022-08-06 DIAGNOSIS — M19011 Primary osteoarthritis, right shoulder: Secondary | ICD-10-CM | POA: Diagnosis not present

## 2022-08-06 DIAGNOSIS — M67911 Unspecified disorder of synovium and tendon, right shoulder: Secondary | ICD-10-CM | POA: Diagnosis not present

## 2022-08-06 DIAGNOSIS — M67912 Unspecified disorder of synovium and tendon, left shoulder: Secondary | ICD-10-CM | POA: Diagnosis not present

## 2022-08-06 DIAGNOSIS — M19012 Primary osteoarthritis, left shoulder: Secondary | ICD-10-CM | POA: Diagnosis not present

## 2022-08-07 DIAGNOSIS — H40053 Ocular hypertension, bilateral: Secondary | ICD-10-CM | POA: Diagnosis not present

## 2022-08-07 DIAGNOSIS — H2513 Age-related nuclear cataract, bilateral: Secondary | ICD-10-CM | POA: Diagnosis not present

## 2022-08-08 ENCOUNTER — Ambulatory Visit
Admission: RE | Admit: 2022-08-08 | Discharge: 2022-08-08 | Disposition: A | Discharge status: Home / Self Care | Payer: Medicare HMO | Source: Ambulatory Visit | Attending: Internal Medicine | Admitting: Internal Medicine

## 2022-08-08 DIAGNOSIS — Z1231 Encounter for screening mammogram for malignant neoplasm of breast: Secondary | ICD-10-CM

## 2022-08-16 ENCOUNTER — Other Ambulatory Visit (HOSPITAL_COMMUNITY): Payer: Self-pay

## 2022-08-20 ENCOUNTER — Other Ambulatory Visit (HOSPITAL_COMMUNITY): Payer: Self-pay

## 2022-08-27 DIAGNOSIS — K5190 Ulcerative colitis, unspecified, without complications: Secondary | ICD-10-CM | POA: Diagnosis not present

## 2022-08-27 DIAGNOSIS — Z932 Ileostomy status: Secondary | ICD-10-CM | POA: Diagnosis not present

## 2022-09-12 ENCOUNTER — Other Ambulatory Visit (HOSPITAL_COMMUNITY): Payer: Self-pay

## 2022-09-13 ENCOUNTER — Other Ambulatory Visit (HOSPITAL_COMMUNITY): Payer: Self-pay

## 2022-09-22 ENCOUNTER — Encounter (HOSPITAL_COMMUNITY): Payer: Self-pay

## 2022-09-24 ENCOUNTER — Ambulatory Visit (INDEPENDENT_AMBULATORY_CARE_PROVIDER_SITE_OTHER): Payer: Medicare HMO | Admitting: Neurology

## 2022-09-24 DIAGNOSIS — G4720 Circadian rhythm sleep disorder, unspecified type: Secondary | ICD-10-CM

## 2022-09-24 DIAGNOSIS — G4733 Obstructive sleep apnea (adult) (pediatric): Secondary | ICD-10-CM

## 2022-09-24 DIAGNOSIS — R0609 Other forms of dyspnea: Secondary | ICD-10-CM

## 2022-09-24 DIAGNOSIS — Z820 Family history of epilepsy and other diseases of the nervous system: Secondary | ICD-10-CM

## 2022-09-24 DIAGNOSIS — R351 Nocturia: Secondary | ICD-10-CM

## 2022-09-24 DIAGNOSIS — G4719 Other hypersomnia: Secondary | ICD-10-CM

## 2022-09-24 DIAGNOSIS — E66811 Obesity, class 1: Secondary | ICD-10-CM

## 2022-09-24 DIAGNOSIS — E669 Obesity, unspecified: Secondary | ICD-10-CM

## 2022-09-24 DIAGNOSIS — Z9189 Other specified personal risk factors, not elsewhere classified: Secondary | ICD-10-CM

## 2022-09-27 DIAGNOSIS — K5190 Ulcerative colitis, unspecified, without complications: Secondary | ICD-10-CM | POA: Diagnosis not present

## 2022-09-27 DIAGNOSIS — Z932 Ileostomy status: Secondary | ICD-10-CM | POA: Diagnosis not present

## 2022-10-01 NOTE — Addendum Note (Signed)
Addended by: Huston Foley on: 10/01/2022 06:28 PM   Modules accepted: Orders

## 2022-10-01 NOTE — Procedures (Signed)
Technical Report:   General Information  Name: Donna [name], Donna [name] BMI: 33.13 Physician: Huston Foley, MD  ID: [id number] Height: 63.0 in Technician: Margaretann Loveless, RPSGT  Sex: Female Weight: 187.0 lb Record: [id number]  Age: 77 [02-11-1945] Date: 09/24/2022    Medical & Medication History    77 year old female with an underlying medical history of hypertension, hyperlipidemia, exertional dyspnea, arthritis, with s/p L TKA, LBP, colon cancer, reflux disease, prediabetes, ulcerative colitis with s/p total colectomy, and mild  obesity, who reports sleep disruption and excessive daytime somnolence. Patient has sleep apnea and uses a CPAP machine.  Tylenol, Norvasc, Aspirin, Vitamin B, Lotensin, Calcium Carbonate, Repatha, Hydrodiuril, Normodyne, Imodium, Menthol-Methyl Salicylate, Multivitamin, Protonix, Metamucil, TraMADol, Coreg, Gabapentin   Sleep Disorder      Comments   The patient came into the sleep lab for a PSG. The patient took Tylenol PM. Per the patient she is no longer taking Gabapentin. One restroom break. EKG showed abnormal arrhythmia. Mild to moderate snoring. Respiratory events scored with a 4% desat. Few leg movements. The patient slept supine and lateral. The patient had a hard time maintaining sleep for the first part of the study. Sleep efficiency 53.85%.  The patient had multiple periods of WASO. AHI was 18.1 after 2hrs of TST.     Lights out: 08:38:47 PM Lights on: 04:59:21 AM   Time Total Supine Side Prone Upright  Recording (TRT) 8h 20.47m 7h 55.8m 0h 25.83m 0h 0.10m 0h 0.86m  Sleep (TST) 4h 29.42m 4h 19.74m 0h 10.9m 0h 0.24m 0h 0.12m   Latency N1 N2 N3 REM Onset Per. Slp. Eff.  Actual 0h 0.30m 0h 1.42m 3h 4.37m 3h 30.23m 1h 17.79m 4h 8.1m 53.85%   Stg Dur Wake N1 N2 N3 REM  Total 231.0 22.5 169.0 27.5 50.5  Supine 216.0 22.5 169.0 27.5 40.0  Side 15.0 0.0 0.0 0.0 10.5  Prone 0.0 0.0 0.0 0.0 0.0  Upright 0.0 0.0 0.0 0.0 0.0   Stg % Wake N1 N2 N3 REM  Total 46.2 8.3 62.7 10.2 18.7  Supine 43.2 8.3 62.7 10.2 14.8  Side 3.0 0.0 0.0 0.0 3.9  Prone 0.0 0.0 0.0 0.0 0.0  Upright 0.0 0.0 0.0 0.0 0.0     Apnea Summary Sub Supine Side Prone Upright  Total 0 Total 0 0 0 0 0    REM 0 0 0 0 0    NREM 0 0 0 0 0  Obs 0 REM 0 0 0 0 0    NREM 0 0 0 0 0  Mix 0 REM 0 0 0 0 0    NREM 0 0 0 0 0  Cen 0 REM 0 0 0 0 0    NREM 0 0 0 0 0   Rera Summary Sub Supine Side Prone Upright  Total 0 Total 0 0 0 0 0    REM 0 0 0 0 0    NREM 0 0 0 0 0   Hypopnea Summary Sub Supine Side Prone Upright  Total 61 Total 61  61 0 0 0    REM 33 33 0 0 0    NREM 28 28 0 0 0   4% Hypopnea Summary Sub Supine Side Prone Upright  Total (4%) 58 Total 58 58 0 0 0    REM 31 31 0 0 0    NREM 27 27 0 0 0     AHI Total Obs Mix Cen  13.58 Apnea 0.00 0.00 0.00 0.00   Hypopnea 13.58 -- -- --  Technical Report:   General Information  Name: Donna [name], Donna [name] BMI: 33.13 Physician: Huston Foley, MD  ID: [id number] Height: 63.0 in Technician: Margaretann Loveless, RPSGT  Sex: Female Weight: 187.0 lb Record: [id number]  Age: 77 [02-11-1945] Date: 09/24/2022    Medical & Medication History    77 year old female with an underlying medical history of hypertension, hyperlipidemia, exertional dyspnea, arthritis, with s/p L TKA, LBP, colon cancer, reflux disease, prediabetes, ulcerative colitis with s/p total colectomy, and mild  obesity, who reports sleep disruption and excessive daytime somnolence. Patient has sleep apnea and uses a CPAP machine.  Tylenol, Norvasc, Aspirin, Vitamin B, Lotensin, Calcium Carbonate, Repatha, Hydrodiuril, Normodyne, Imodium, Menthol-Methyl Salicylate, Multivitamin, Protonix, Metamucil, TraMADol, Coreg, Gabapentin   Sleep Disorder      Comments   The patient came into the sleep lab for a PSG. The patient took Tylenol PM. Per the patient she is no longer taking Gabapentin. One restroom break. EKG showed abnormal arrhythmia. Mild to moderate snoring. Respiratory events scored with a 4% desat. Few leg movements. The patient slept supine and lateral. The patient had a hard time maintaining sleep for the first part of the study. Sleep efficiency 53.85%.  The patient had multiple periods of WASO. AHI was 18.1 after 2hrs of TST.     Lights out: 08:38:47 PM Lights on: 04:59:21 AM   Time Total Supine Side Prone Upright  Recording (TRT) 8h 20.47m 7h 55.8m 0h 25.83m 0h 0.10m 0h 0.86m  Sleep (TST) 4h 29.42m 4h 19.74m 0h 10.9m 0h 0.24m 0h 0.12m   Latency N1 N2 N3 REM Onset Per. Slp. Eff.  Actual 0h 0.30m 0h 1.42m 3h 4.37m 3h 30.23m 1h 17.79m 4h 8.1m 53.85%   Stg Dur Wake N1 N2 N3 REM  Total 231.0 22.5 169.0 27.5 50.5  Supine 216.0 22.5 169.0 27.5 40.0  Side 15.0 0.0 0.0 0.0 10.5  Prone 0.0 0.0 0.0 0.0 0.0  Upright 0.0 0.0 0.0 0.0 0.0   Stg % Wake N1 N2 N3 REM  Total 46.2 8.3 62.7 10.2 18.7  Supine 43.2 8.3 62.7 10.2 14.8  Side 3.0 0.0 0.0 0.0 3.9  Prone 0.0 0.0 0.0 0.0 0.0  Upright 0.0 0.0 0.0 0.0 0.0     Apnea Summary Sub Supine Side Prone Upright  Total 0 Total 0 0 0 0 0    REM 0 0 0 0 0    NREM 0 0 0 0 0  Obs 0 REM 0 0 0 0 0    NREM 0 0 0 0 0  Mix 0 REM 0 0 0 0 0    NREM 0 0 0 0 0  Cen 0 REM 0 0 0 0 0    NREM 0 0 0 0 0   Rera Summary Sub Supine Side Prone Upright  Total 0 Total 0 0 0 0 0    REM 0 0 0 0 0    NREM 0 0 0 0 0   Hypopnea Summary Sub Supine Side Prone Upright  Total 61 Total 61  61 0 0 0    REM 33 33 0 0 0    NREM 28 28 0 0 0   4% Hypopnea Summary Sub Supine Side Prone Upright  Total (4%) 58 Total 58 58 0 0 0    REM 31 31 0 0 0    NREM 27 27 0 0 0     AHI Total Obs Mix Cen  13.58 Apnea 0.00 0.00 0.00 0.00   Hypopnea 13.58 -- -- --  Physician Interpretation:     [facility] POLYSOMNOGRAPHY  INTERPRETATION REPORT   STUDY DATE:  09/24/2022     PATIENT NAME:  Donna [name]         DATE OF BIRTH:  [birth year]  PATIENT ID:  [id number]    TYPE OF STUDY:  PSG  READING PHYSICIAN: Huston Foley, MD, PhD   SCORING TECHNICIAN: Margaretann Loveless, RPSGT   Referred by: Dr. Truett Mainland ? History and Indication for Testing: 77 year old female with an underlying medical history of hypertension, hyperlipidemia, exertional dyspnea, arthritis, with s/p L TKA, LBP, colon cancer, reflux disease, prediabetes, ulcerative colitis with s/p total colectomy, and mild obesity, who reports sleep disruption and excessive daytime somnolence. Her Epworth sleepiness score is 11 out of 24, fatigue severity score is 43 out of 63.  Height: 63 in Weight: 187 lb (BMI 33) Neck Size: 15 in    MEDICATIONS: Tylenol, Norvasc, Aspirin, Vitamin B, Lotensin, Calcium Carbonate, Repatha, Hydrodiuril, Normodyne, Imodium, Menthol-Methyl Salicylate, Multivitamin, Protonix, Metamucil, TraMADol, Coreg, Gabapentin   TECHNICAL DESCRIPTION: A registered sleep technologist was in attendance for the duration of the recording.  Data collection, scoring, video monitoring, and reporting were performed in compliance with the AASM Manual for the Scoring of Sleep and Associated Events; (Hypopnea is scored based on the criteria listed in Section VIII D. 1b in the AASM Manual V2.6 using a 4% oxygen desaturation rule or Hypopnea is scored based on the criteria listed in Section VIII D. 1a in the AASM Manual V2.6 using 3% oxygen desaturation and /or arousal rule).   SLEEP CONTINUITY AND SLEEP ARCHITECTURE:  Lights-out was at 20:38: and lights-on at  04:59:, with a total recording time of 8 hours, 20.5 min . Total sleep time ( TST) was 269.5 minutes with a decreased sleep efficiency at 53.8%. There was  18.7% REM sleep.  BODY POSITION:  TST was divided   between the following sleep positions: 96.1% supine;  3.9% lateral;  0% prone. Duration of total sleep and percent of total sleep in their respective position is as follows: supine 259 minutes (96%), non-supine 11 minutes (4%); right 10 minutes (4%), left 00 minutes (0%), and prone 00 minutes (0%).  Total supine REM sleep time was 40 minutes (79% of total REM sleep).  Sleep latency was increased at 77.5 minutes.  REM sleep latency was increased at 210.5 minutes. Of the total sleep time, the percentage of stage N1 sleep was 8.3%, stage N2 sleep was 63%, which is increased, stage N3 sleep was 10.2%, and REM sleep was 18.7%, which is mildly reduced.  Wake after sleep onset (WASO) time accounted for 153.5 minutes with mild to moderate sleep fragmentation noted.   RESPIRATORY MONITORING:   Based on CMS criteria (using a 4% oxygen desaturation rule for scoring hypopneas), there were 0 apneas (0 obstructive; 0 central; 0 mixed), and 58 hypopneas.  Apnea index was 0.0. Hypopnea index was 12.9. The apnea-hypopnea index was 12.9/hour overall (13.4 supine, 0 non-supine; 36.8 REM, 46.5 supine REM).  There were 0 respiratory effort-related arousals (RERAs).  The RERA index was 0 events/h. Total respiratory disturbance index (RDI) was 12.9 events/h. RDI results showed: supine RDI  13.4 /h; non-supine RDI 0.0 /h; REM RDI 36.8 /h, supine REM RDI 46.5 /h.   Based on AASM criteria (using a 3% oxygen desaturation and /or arousal rule for scoring hypopneas), there were 0 apneas (0 obstructive; 0 central; 0 mixed), and 61 hypopneas. Apnea index was 0.0. Hypopnea index was  Technical Report:   General Information  Name: Donna [name], Donna [name] BMI: 33.13 Physician: Huston Foley, MD  ID: [id number] Height: 63.0 in Technician: Margaretann Loveless, RPSGT  Sex: Female Weight: 187.0 lb Record: [id number]  Age: 77 [02-11-1945] Date: 09/24/2022    Medical & Medication History    77 year old female with an underlying medical history of hypertension, hyperlipidemia, exertional dyspnea, arthritis, with s/p L TKA, LBP, colon cancer, reflux disease, prediabetes, ulcerative colitis with s/p total colectomy, and mild  obesity, who reports sleep disruption and excessive daytime somnolence. Patient has sleep apnea and uses a CPAP machine.  Tylenol, Norvasc, Aspirin, Vitamin B, Lotensin, Calcium Carbonate, Repatha, Hydrodiuril, Normodyne, Imodium, Menthol-Methyl Salicylate, Multivitamin, Protonix, Metamucil, TraMADol, Coreg, Gabapentin   Sleep Disorder      Comments   The patient came into the sleep lab for a PSG. The patient took Tylenol PM. Per the patient she is no longer taking Gabapentin. One restroom break. EKG showed abnormal arrhythmia. Mild to moderate snoring. Respiratory events scored with a 4% desat. Few leg movements. The patient slept supine and lateral. The patient had a hard time maintaining sleep for the first part of the study. Sleep efficiency 53.85%.  The patient had multiple periods of WASO. AHI was 18.1 after 2hrs of TST.     Lights out: 08:38:47 PM Lights on: 04:59:21 AM   Time Total Supine Side Prone Upright  Recording (TRT) 8h 20.47m 7h 55.8m 0h 25.83m 0h 0.10m 0h 0.86m  Sleep (TST) 4h 29.42m 4h 19.74m 0h 10.9m 0h 0.24m 0h 0.12m   Latency N1 N2 N3 REM Onset Per. Slp. Eff.  Actual 0h 0.30m 0h 1.42m 3h 4.37m 3h 30.23m 1h 17.79m 4h 8.1m 53.85%   Stg Dur Wake N1 N2 N3 REM  Total 231.0 22.5 169.0 27.5 50.5  Supine 216.0 22.5 169.0 27.5 40.0  Side 15.0 0.0 0.0 0.0 10.5  Prone 0.0 0.0 0.0 0.0 0.0  Upright 0.0 0.0 0.0 0.0 0.0   Stg % Wake N1 N2 N3 REM  Total 46.2 8.3 62.7 10.2 18.7  Supine 43.2 8.3 62.7 10.2 14.8  Side 3.0 0.0 0.0 0.0 3.9  Prone 0.0 0.0 0.0 0.0 0.0  Upright 0.0 0.0 0.0 0.0 0.0     Apnea Summary Sub Supine Side Prone Upright  Total 0 Total 0 0 0 0 0    REM 0 0 0 0 0    NREM 0 0 0 0 0  Obs 0 REM 0 0 0 0 0    NREM 0 0 0 0 0  Mix 0 REM 0 0 0 0 0    NREM 0 0 0 0 0  Cen 0 REM 0 0 0 0 0    NREM 0 0 0 0 0   Rera Summary Sub Supine Side Prone Upright  Total 0 Total 0 0 0 0 0    REM 0 0 0 0 0    NREM 0 0 0 0 0   Hypopnea Summary Sub Supine Side Prone Upright  Total 61 Total 61  61 0 0 0    REM 33 33 0 0 0    NREM 28 28 0 0 0   4% Hypopnea Summary Sub Supine Side Prone Upright  Total (4%) 58 Total 58 58 0 0 0    REM 31 31 0 0 0    NREM 27 27 0 0 0     AHI Total Obs Mix Cen  13.58 Apnea 0.00 0.00 0.00 0.00   Hypopnea 13.58 -- -- --

## 2022-10-04 ENCOUNTER — Telehealth: Payer: Self-pay | Admitting: *Deleted

## 2022-10-04 NOTE — Telephone Encounter (Signed)
Called pt & LVM (ok per DPR) asking for call back or response to mychart message regarding sleep study results. I advised SS showed overall mild OSA but Dr Frances Furbish does recommend treatment to see if she feels better with treatment. Left office number and hours in message.

## 2022-10-04 NOTE — Telephone Encounter (Signed)
-----   Message from Huston Foley sent at 10/01/2022  6:28 PM EDT ----- Patient referred by Dr. Rosemary Holms, seen by me on 05/31/22, diagnostic PSG on 09/24/22.    Please call and notify the patient that the recent sleep study did confirm the diagnosis of obstructive sleep apnea. OSA is overall mild, but worth treating to see if she feels better after treatment. To that end I recommend treatment for this in the form of autoPAP, which means, that we don't have to bring her back for a second sleep study with CPAP, but will let him try an autoPAP machine at home, through a DME company (of her choice, or as per insurance requirement). The DME representative will educate her on how to use the machine, how to put the mask on, etc. I have placed an order in the chart. Please send referral, talk to patient, send report to referring MD. We will need a FU in sleep clinic for 10 weeks post-PAP set up, please arrange that with me or one of our NPs. Thanks,   Huston Foley, MD, PhD [facility])

## 2022-10-05 DIAGNOSIS — Z23 Encounter for immunization: Secondary | ICD-10-CM | POA: Diagnosis not present

## 2022-10-08 NOTE — Telephone Encounter (Signed)
Pt returned phone call, would like a call from the nurse. ?

## 2022-10-08 NOTE — Telephone Encounter (Signed)
I called the patient and discussed her sleep study results as noted below by Dr Frances Furbish.  The patient verbalized understanding and she is amenable to start treatment with AutoPap.  Her questions were answered.  We discussed the insurance compliance requirements which includes using the machine at least 4 hours at night and also being seen in our office between 30 and 90 days after set up.  The patient scheduled an initial follow-up appointment for January 16, 2023 at 9:00 AM arrival 8:30 AM.  Patient prefers to come in for the visit instead of doing video visit.  Patient has [facility] HMO.  Referral has been sent to [employer].  Patient will watch for a call within 1 week.  I gave her the phone number for [employer] that she can call if she doesn't hear from them in that timeframe.  The patient verbalized appreciation for the call.

## 2022-10-11 ENCOUNTER — Other Ambulatory Visit (HOSPITAL_COMMUNITY): Payer: Self-pay | Admitting: Pharmacy Technician

## 2022-10-11 ENCOUNTER — Other Ambulatory Visit (HOSPITAL_COMMUNITY): Payer: Self-pay

## 2022-10-11 NOTE — Progress Notes (Signed)
Specialty Pharmacy Refill Coordination Note  Donna [name] UJWJXB is a 77 y.o. female contacted today regarding refills of specialty medication(s) Evolocumab   Patient requested Delivery   Delivery date: 10/18/22   Verified address: [address] [place]   Medication will be filled on 10/17/22.

## 2022-10-29 DIAGNOSIS — K5190 Ulcerative colitis, unspecified, without complications: Secondary | ICD-10-CM | POA: Diagnosis not present

## 2022-10-29 DIAGNOSIS — Z932 Ileostomy status: Secondary | ICD-10-CM | POA: Diagnosis not present

## 2022-11-06 DIAGNOSIS — M67912 Unspecified disorder of synovium and tendon, left shoulder: Secondary | ICD-10-CM | POA: Diagnosis not present

## 2022-11-06 DIAGNOSIS — M67911 Unspecified disorder of synovium and tendon, right shoulder: Secondary | ICD-10-CM | POA: Diagnosis not present

## 2022-11-07 ENCOUNTER — Other Ambulatory Visit (HOSPITAL_COMMUNITY): Payer: Self-pay

## 2022-11-08 ENCOUNTER — Other Ambulatory Visit: Payer: Self-pay

## 2022-11-08 ENCOUNTER — Other Ambulatory Visit (HOSPITAL_COMMUNITY): Payer: Self-pay

## 2022-11-08 NOTE — Progress Notes (Signed)
Specialty Pharmacy Refill Coordination Note  Donna [name] WGNFAO is a 77 y.o. female contacted today regarding refills of specialty medication(s) Evolocumab   Patient requested No data recorded  Delivery date: 11/16/22   Verified address: [address] [place]   Medication will be filled on 11/15/22.

## 2022-11-28 DIAGNOSIS — K5190 Ulcerative colitis, unspecified, without complications: Secondary | ICD-10-CM | POA: Diagnosis not present

## 2022-11-28 DIAGNOSIS — M5416 Radiculopathy, lumbar region: Secondary | ICD-10-CM | POA: Diagnosis not present

## 2022-11-28 DIAGNOSIS — Z932 Ileostomy status: Secondary | ICD-10-CM | POA: Diagnosis not present

## 2022-12-06 ENCOUNTER — Other Ambulatory Visit (HOSPITAL_COMMUNITY): Payer: Self-pay

## 2022-12-06 ENCOUNTER — Other Ambulatory Visit: Payer: Self-pay

## 2022-12-06 NOTE — Progress Notes (Signed)
Specialty Pharmacy Refill Coordination Note  Kayla Bettner OZHYQM is a 77 y.o. female contacted today regarding refills of specialty medication(s) Evolocumab   Patient requested Delivery   Delivery date: 12/13/22   Verified address: [address]   Medication will be filled on 12/12/22.   Next Injection is due 12/18/22.

## 2022-12-12 ENCOUNTER — Other Ambulatory Visit: Payer: Self-pay

## 2022-12-21 ENCOUNTER — Other Ambulatory Visit: Payer: Self-pay | Admitting: Cardiology

## 2022-12-21 DIAGNOSIS — I10 Essential (primary) hypertension: Secondary | ICD-10-CM

## 2022-12-29 IMAGING — CT CT CARDIAC CORONARY ARTERY CALCIUM SCORE
3 series · 14 of 20 positions shown, 16 images · non-contrast
Comparison: Prior CT of the chest on 01/30/2019

CLINICAL DATA: 76-year-old African American female with history of
hyperlipidemia.

EXAM:
CT CARDIAC CORONARY ARTERY CALCIUM SCORE
TECHNIQUE: Non-contrast imaging through the heart was performed using
prospective ECG gating. Image post processing was performed on an
independent workstation, allowing for quantitative analysis of the
heart and coronary arteries. Note that this exam targets the heart
and the chest was not imaged in its entirety.

[Series 2: calcium scoring 2.00 qr36 bestdiast 70% hrt calciu · axial · 0.37mm/px · z∈[+1574,+1646]mm · 4 of 60 slices shown]
[im 12/60  vessel]
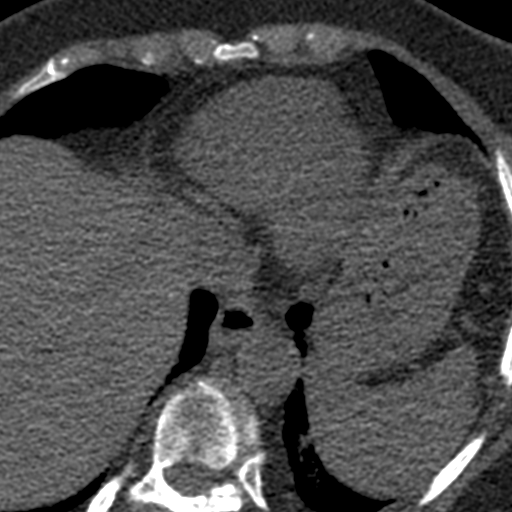
[im 24/60  vessel]
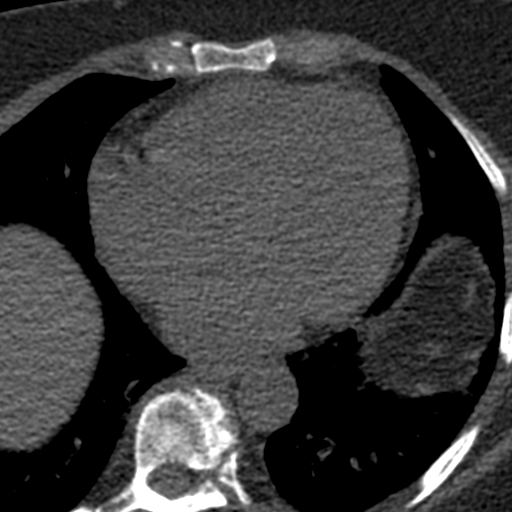
[im 36/60  vessel]
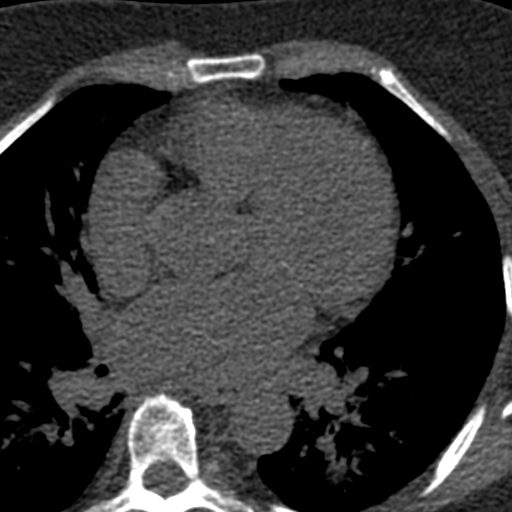
[im 48/60  vessel]
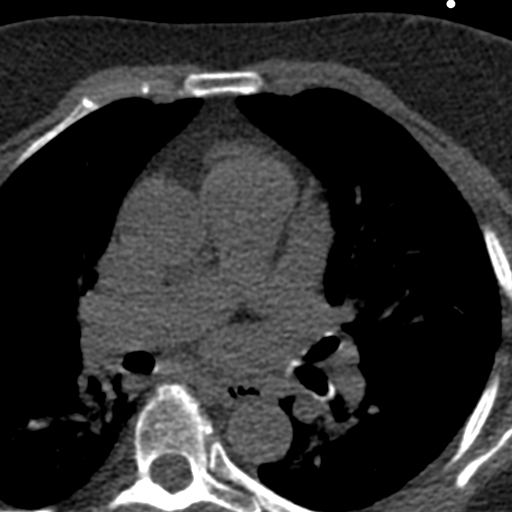

[Series 3: calcium scoring 2.00 br40 bestdiast 70% axial · axial · 0.53mm/px · z∈[+1570,+1650]mm · 5 of 60 slices shown, 7 images]
[im 10/60  vessel]
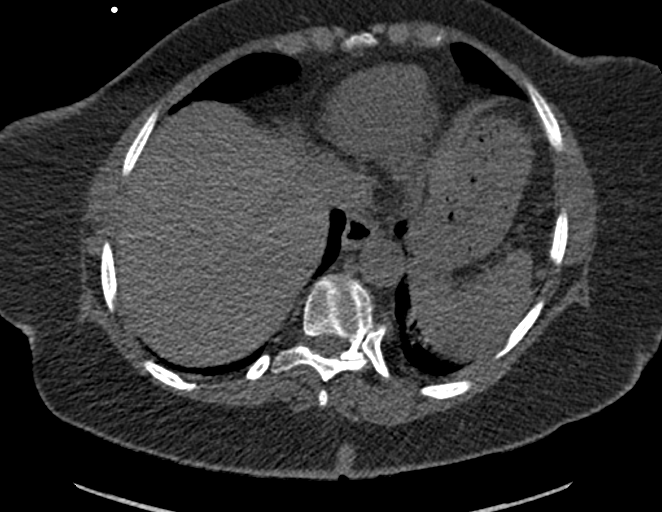
[im 10/60  lung]
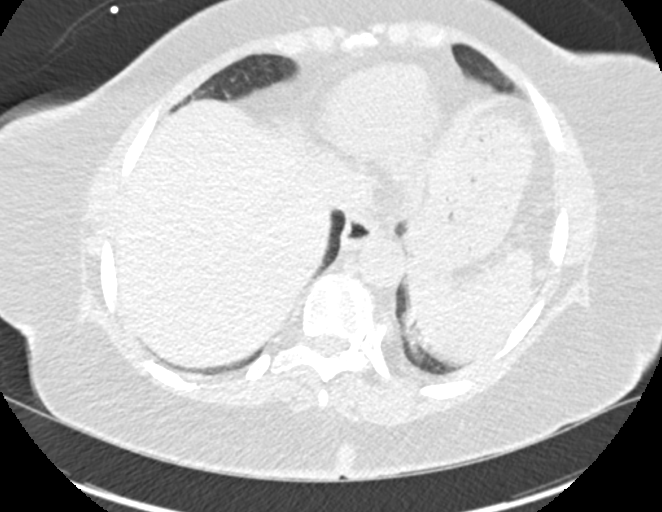
[im 20/60  vessel]
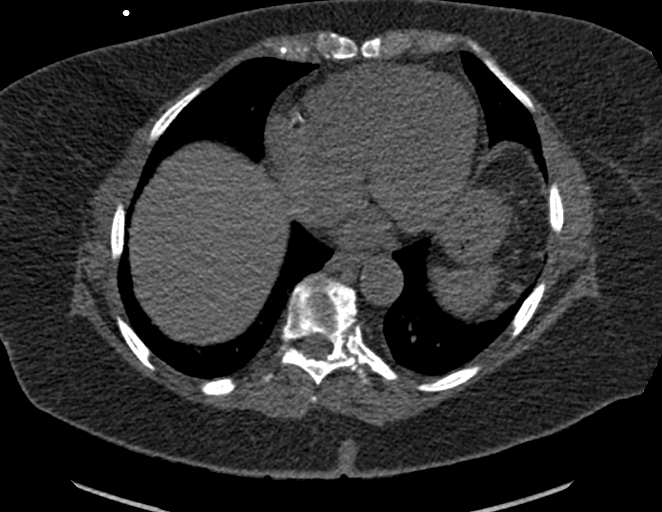
[im 30/60  vessel]
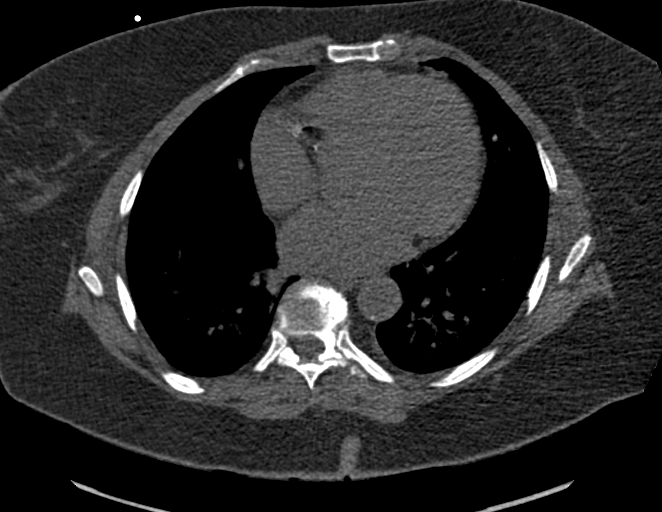
[im 40/60  vessel]
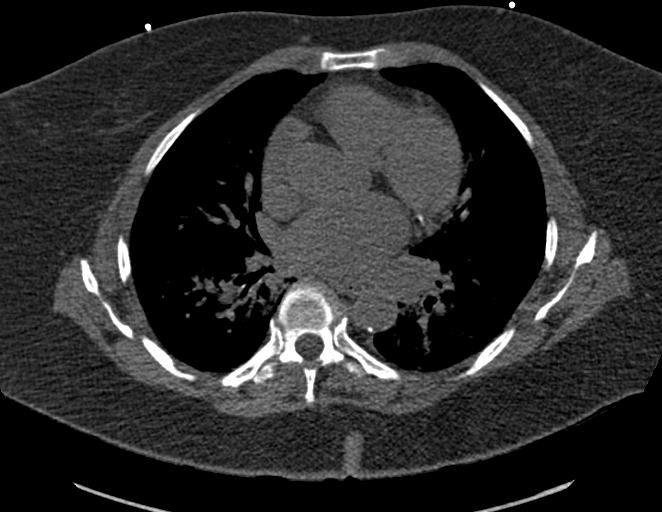
[im 50/60  vessel]
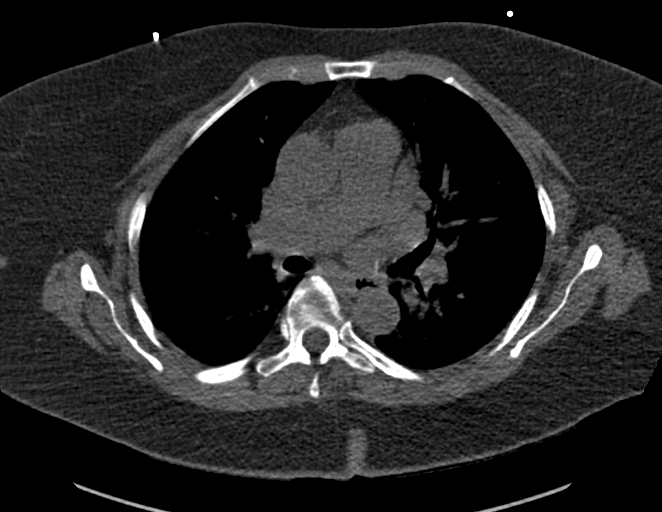
[im 50/60  lung]
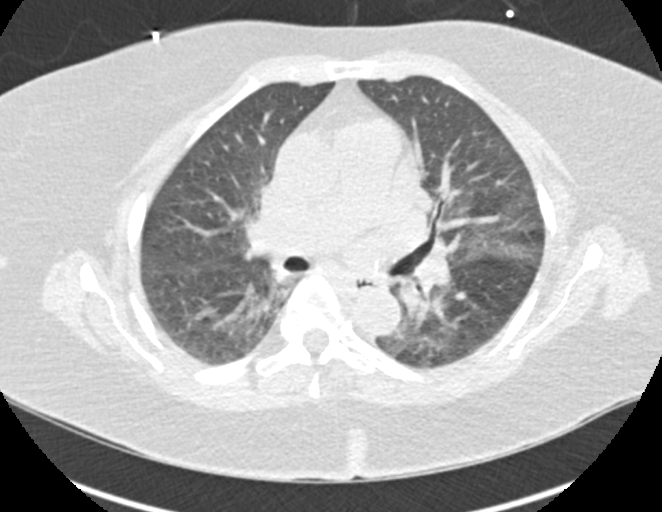

[Series 9: calcium scoring 2.00 br60 bestdiast 70% lungs · axial · 0.53mm/px · z∈[+1570,+1650]mm · 5 of 60 slices shown]
[im 10/60  vessel]
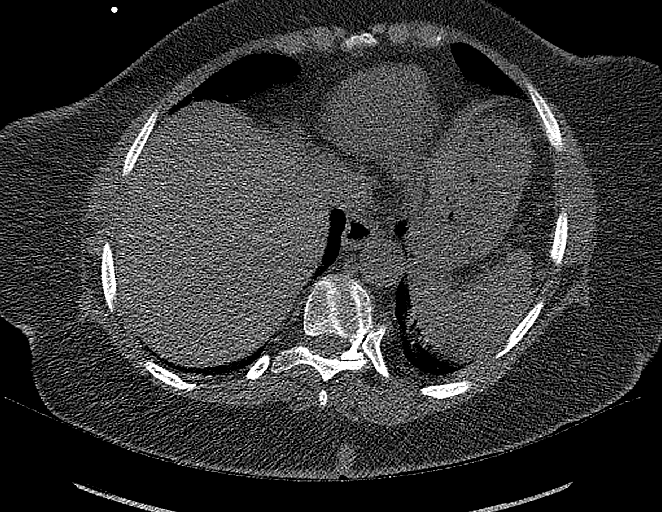
[im 20/60  vessel]
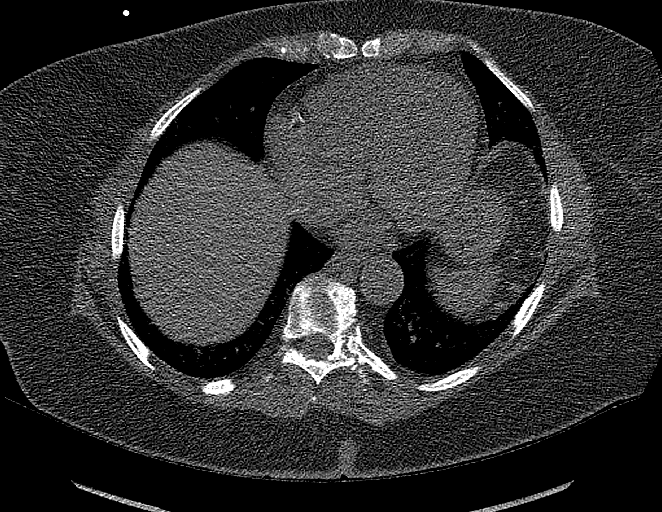
[im 30/60  vessel]
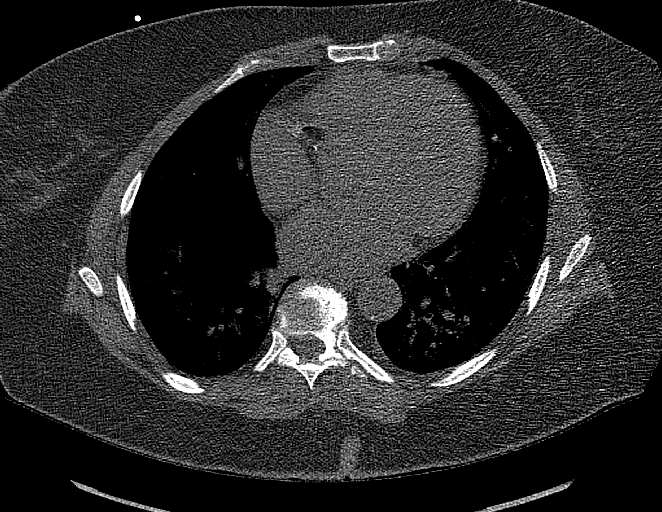
[im 40/60  vessel]
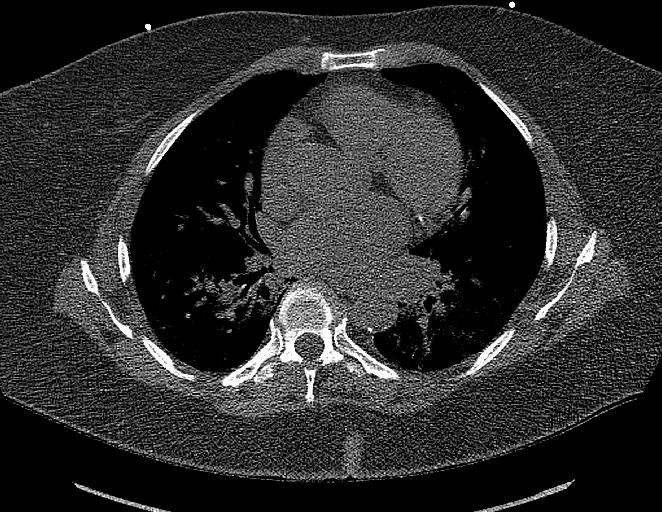
[im 50/60  vessel]
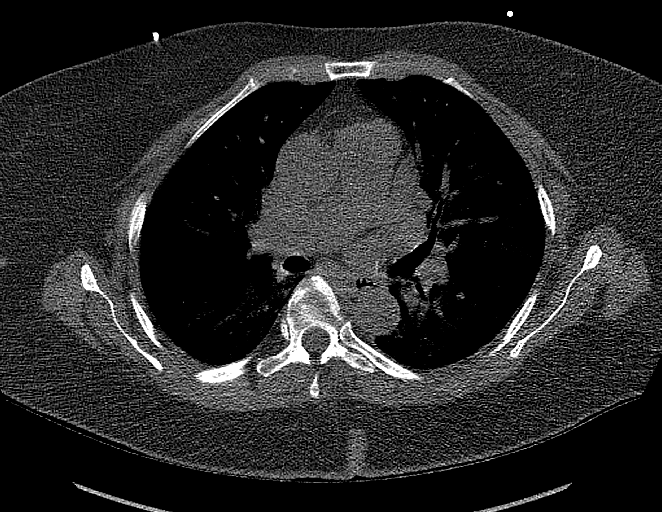

[14 of 20 positions shown; findings below may reference images not displayed]

FINDINGS: CORONARY CALCIUM SCORES:

Left Main: 185

LAD: 304

LCx: 96

RCA: 280

Total Agatston Score: 866

[HOSPITAL] percentile: 96

AORTA MEASUREMENTS:

Ascending Aorta: 29 mm

Descending Aorta: 23 mm

OTHER FINDINGS:

The heart size is within normal limits. No pericardial fluid is
identified. The thoracic aorta demonstrates atherosclerosis.
Visualized segments of the thoracic aorta and central pulmonary
arteries are normal in caliber. Visualized mediastinum and hilar
regions demonstrate no lymphadenopathy or masses. Findings likely
reflecting pulmonary venous hypertension and potentially mild
pulmonary interstitial edema. Visualized lungs show no evidence of
pulmonary consolidation, pneumothorax, nodule or pleural fluid.
Visualized upper abdomen is unremarkable. There is a rightward
convex scoliosis of the thoracic spine.
IMPRESSION: 1. Coronary calcium score of 866 is at the 96th percentile for the
patient's age, sex and race.
2. Atherosclerosis of the thoracic aorta.
3. Pulmonary venous hypertension and potentially mild pulmonary
interstitial edema.

## 2023-01-01 DIAGNOSIS — Z932 Ileostomy status: Secondary | ICD-10-CM | POA: Diagnosis not present

## 2023-01-01 DIAGNOSIS — K5190 Ulcerative colitis, unspecified, without complications: Secondary | ICD-10-CM | POA: Diagnosis not present

## 2023-01-04 DIAGNOSIS — I10 Essential (primary) hypertension: Secondary | ICD-10-CM | POA: Diagnosis not present

## 2023-01-04 DIAGNOSIS — R7309 Other abnormal glucose: Secondary | ICD-10-CM | POA: Diagnosis not present

## 2023-01-04 DIAGNOSIS — E7800 Pure hypercholesterolemia, unspecified: Secondary | ICD-10-CM | POA: Diagnosis not present

## 2023-01-05 ENCOUNTER — Other Ambulatory Visit (HOSPITAL_COMMUNITY): Payer: Self-pay

## 2023-01-05 LAB — LAB REPORT - SCANNED
A1c: 5.7
EGFR: 32

## 2023-01-07 ENCOUNTER — Other Ambulatory Visit: Payer: Self-pay

## 2023-01-07 NOTE — Progress Notes (Signed)
 Specialty Pharmacy Refill Coordination Note  Donna [name] is a 78 y.o. female contacted today regarding refills of specialty medication(s) Evolocumab  (Repatha  [employer])   Patient requested Delivery   Delivery date: 01/08/23   Verified address: [address]  [name] [address]   Medication will be filled on 01/07/23.

## 2023-01-11 DIAGNOSIS — I10 Essential (primary) hypertension: Secondary | ICD-10-CM | POA: Diagnosis not present

## 2023-01-11 DIAGNOSIS — N1832 Chronic kidney disease, stage 3b: Secondary | ICD-10-CM | POA: Diagnosis not present

## 2023-01-11 DIAGNOSIS — Z0000 Encounter for general adult medical examination without abnormal findings: Secondary | ICD-10-CM | POA: Diagnosis not present

## 2023-01-11 DIAGNOSIS — K219 Gastro-esophageal reflux disease without esophagitis: Secondary | ICD-10-CM | POA: Diagnosis not present

## 2023-01-11 DIAGNOSIS — Z23 Encounter for immunization: Secondary | ICD-10-CM | POA: Diagnosis not present

## 2023-01-11 DIAGNOSIS — I2510 Atherosclerotic heart disease of native coronary artery without angina pectoris: Secondary | ICD-10-CM | POA: Diagnosis not present

## 2023-01-11 DIAGNOSIS — R7309 Other abnormal glucose: Secondary | ICD-10-CM | POA: Diagnosis not present

## 2023-01-11 DIAGNOSIS — K5190 Ulcerative colitis, unspecified, without complications: Secondary | ICD-10-CM | POA: Diagnosis not present

## 2023-01-11 DIAGNOSIS — Z9049 Acquired absence of other specified parts of digestive tract: Secondary | ICD-10-CM | POA: Diagnosis not present

## 2023-01-15 ENCOUNTER — Telehealth: Payer: Self-pay

## 2023-01-15 NOTE — Telephone Encounter (Signed)
 New, Maryella Shivers, Otilio Jefferson, RN; Alain Honey; Jeris Penta, [place]; 1 other Received, working it now.  [employer] is already processing her order. So sorry for the delay.

## 2023-01-15 NOTE — Telephone Encounter (Signed)
 LVM asking pt to bring CPAP Machine to her appointment on tomorrow 01/16/2023.  Please see MyChart message from today 01/15/2023

## 2023-01-15 NOTE — Progress Notes (Deleted)
 PATIENT: Donna [name] ZOXWRU DOB: 1945-08-22  REASON FOR VISIT: follow up HISTORY FROM: patient  No chief complaint on file.    HISTORY OF PRESENT ILLNESS:  01/15/23 ALL: Donna [name] is a 78 y.o. female here today for follow up for OSa on CPAP. She was seen in consult with Dr Omar Bibber 05/2022. She reported daytime sleepiness and fatigue despite using her CPAP machine ??? PSG 09/2022 showed mild obstructive sleep apnea,  severe in REM sleep with a total AHI of 12.9/hour, REM AHI of 36.8/hour, supine AHI of 13.4/hour and O2 nadir of 86%. AutoPAP advised. Since,    History (copied from Dr Dail Drought previous note)  Dear Dr. Filiberto Hug,   I saw your patient, Donna [name], upon your kind request in my sleep clinic today for initial consultation of her sleep disorder, in particular, concern for underlying obstructive sleep apnea.  The patient is unaccompanied today.  As you know, Donna [name] is a 78 year old female with an underlying medical history of hypertension, hyperlipidemia, exertional dyspnea, arthritis, with s/p L TKA, LBP, colon cancer, reflux disease, prediabetes, ulcerative colitis with s/p total colectomy, and mild obesity, who reports sleep disruption and excessive daytime somnolence.  Her Epworth sleepiness score is 11 out of 24, fatigue severity score is 43 out of 63.  I reviewed your office note from 04/26/2022.  Patient has sleep apnea and uses a CPAP machine.  She has a variable sleep schedule, generally goes to bed before midnight and rise time is around 8 but she is often awake in the middle of the night and has trouble maintaining sleep.  She has a colostomy bag and empties it once or twice per night and also urinates about twice or once per night on average.  She denies recurrent nocturnal or morning headaches.  She lives with her middle son, her oldest son has sleep apnea.  She has a total of 3 sons.  She works for [employer] for over 44 years.  She is divorced.  She is a  non-smoker and does not drink any alcohol, she limits her caffeine to 1 cup of coffee per day on average.   REVIEW OF SYSTEMS: Out of a complete 14 system review of symptoms, the patient complains only of the following symptoms, and all other reviewed systems are negative.  ESS:  ALLERGIES: Allergies  Allergen Reactions   Iodinated Contrast Media Other (See Comments)    Feels faint  coughing, bp elevated   Mesalamine Other (See Comments)   Ozempic (0.25 Or 0.5 Mg-Dose) [Semaglutide(0.25 Or 0.5mg -Dos)] Nausea And Vomiting   Prilosec [Omeprazole ]     Made knees hurt and made me sick   Statins Hives and Other (See Comments)    Muscle spasms     HOME MEDICATIONS: Outpatient Medications Prior to Visit  Medication Sig Dispense Refill   acetaminophen  (TYLENOL ) 500 MG tablet Take 1,000 mg by mouth every 6 (six) hours as needed for mild pain or headache.     amLODipine  (NORVASC ) 10 MG tablet Take 10 mg by mouth daily.      aspirin  EC 81 MG tablet Take 1 tablet (81 mg total) by mouth daily. Swallow whole. 90 tablet 3   B Complex Vitamins (B COMPLEX 100 PO) Take 1 tablet by mouth daily.     benazepril  (LOTENSIN ) 40 MG tablet Take 40 mg by mouth at bedtime.     calcium carbonate (OSCAL) 1500 (600 Ca) MG TABS tablet Take 600 mg of elemental calcium by mouth  2 (two) times daily with a meal.     Evolocumab  (REPATHA  SURECLICK) 140 MG/ML SOAJ Inject 140 mg (1 pen) into the skin every 14 days 6 mL 3   hydrochlorothiazide  (HYDRODIURIL ) 25 MG tablet Take 25 mg by mouth daily.     labetalol  (NORMODYNE ) 100 MG tablet TAKE 1 TABLET TWICE DAILY 180 tablet 0   latanoprost  (XALATAN ) 0.005 % ophthalmic solution Place 1 drop into both eyes at bedtime.     loperamide  (IMODIUM ) 2 MG capsule Take 1 capsule (2 mg total) by mouth 2 (two) times daily. (Patient taking differently: Take 4 mg by mouth in the morning and at bedtime.) 60 capsule 1   Menthol-Methyl Salicylate (MUSCLE RUB) 10-15 % CREA Apply 1  application topically 2 (two) times daily as needed for muscle pain.     Multiple Vitamin (MULTIVITAMIN WITH MINERALS) TABS tablet Take 1 tablet by mouth daily. Woman's one a day     pantoprazole  (PROTONIX ) 40 MG tablet Take 40 mg by mouth 2 (two) times daily before a meal.      psyllium (HYDROCIL/METAMUCIL) 95 % PACK Take 1 packet by mouth daily. (Patient taking differently: Take 1 packet by mouth 2 (two) times daily.) 30 packet 1   timolol (TIMOPTIC) 0.5 % ophthalmic solution Place 1 drop into both eyes every morning.     traMADol  (ULTRAM ) 50 MG tablet Take 50 mg by mouth every 6 (six) hours as needed for severe pain.     No facility-administered medications prior to visit.    PAST MEDICAL HISTORY: Past Medical History:  Diagnosis Date   Arthritis    Bulging lumbar disc    Cancer (HCC)    colon   Degeneration of lumbar or lumbosacral intervertebral disc 01/21/2015   Essential hypertension 01/21/2015   GERD (gastroesophageal reflux disease)    GERD (gastroesophageal reflux disease) 01/21/2015   Heart murmur    Hyperlipidemia    Hyperlipidemia 01/21/2015   Hypertension    Pre-diabetes    diet   Ulcerative colitis (HCC)     PAST SURGICAL HISTORY: Past Surgical History:  Procedure Laterality Date   BIOPSY  06/29/2022   Procedure: BIOPSY;  Surgeon: Melvenia Stabs, MD;  Location: [facility];  Service: General;;   CESAREAN SECTION     COLON SURGERY     FLEXIBLE SIGMOIDOSCOPY N/A 05/18/2020   Procedure: SURVEILLANCE FLEXIBLE SIGMOIDOSCOPY;  Surgeon: Melvenia Stabs, MD;  Location: [facility];  Service: General;  Laterality: N/A;   FLEXIBLE SIGMOIDOSCOPY N/A 06/29/2022   Procedure: Marlynn Singer;  Surgeon: Melvenia Stabs, MD;  Location: [facility];  Service: General;  Laterality: N/A;   INCISIONAL HERNIA REPAIR N/A 05/29/2019   Procedure: PRIMARY INCISIONAL HERNIA REPAIR;  Surgeon: Melvenia Stabs, MD;  Location: [facility];  Service: General;  Laterality:  N/A;   LAPAROTOMY Right 01/12/2015   Procedure: OPEN RIGHT COLECTOMY;  Surgeon: Enid Harry, MD;  Location: [facility];  Service: General;  Laterality: Right;   RIGHT COLECTOMY  01/12/2015   TOTAL KNEE ARTHROPLASTY Left 06/20/2018   Procedure: TOTAL KNEE ARTHROPLASTY;  Surgeon: Neil Balls, MD;  Location: [facility];  Service: Orthopedics;  Laterality: Left;    FAMILY HISTORY: Family History  Problem Relation Age of Onset   Diabetes Mother    Ulcerative colitis Father    Heart failure Sister    Sleep apnea Son     SOCIAL HISTORY: Social History   Socioeconomic History   Marital status: Divorced    Spouse name: Not  on file   Number of children: 3   Years of education: Not on file   Highest education level: Not on file  Occupational History   Not on file  Tobacco Use   Smoking status: Never   Smokeless tobacco: Never  Vaping Use   Vaping status: Never Used  Substance and Sexual Activity   Alcohol use: No   Drug use: No   Sexual activity: Not on file  Other Topics Concern   Not on file  Social History Narrative   Not on file   Social Drivers of Health   Financial Resource Strain: Not on file  Food Insecurity: No Food Insecurity (11/03/2021)   Hunger Vital Sign    Worried About Running Out of Food in the Last Year: Never true    Ran Out of Food in the Last Year: Never true  Transportation Needs: No Transportation Needs (08/02/2021)   PRAPARE - Administrator, Civil Service (Medical): No    Lack of Transportation (Non-Medical): No  Physical Activity: Not on file  Stress: Not on file  Social Connections: Not on file  Intimate Partner Violence: Not on file     PHYSICAL EXAM  There were no vitals filed for this visit. There is no height or weight on file to calculate BMI.  Generalized: Well developed, in no acute distress  Cardiology: normal rate and rhythm, no murmur noted Respiratory: clear to auscultation bilaterally  Neurological examination   Mentation: Alert oriented to time, place, history taking. Follows all commands speech and language fluent Cranial nerve II-XII: Pupils were equal round reactive to light. Extraocular movements were full, visual field were full  Motor: The motor testing reveals 5 over 5 strength of all 4 extremities. Good symmetric motor tone is noted throughout.  Gait and station: Gait is normal.    DIAGNOSTIC DATA (LABS, IMAGING, TESTING) - I reviewed patient records, labs, notes, testing and imaging myself where available.      No data to display           Lab Results  Component Value Date   WBC 3.9 04/30/2022   HGB 11.0 (L) 04/30/2022   HCT 34.7 04/30/2022   MCV 93 04/30/2022   PLT 251 04/30/2022      Component Value Date/Time   NA 144 04/30/2022 0820   K 4.3 04/30/2022 0820   CL 107 (H) 04/30/2022 0820   CO2 22 04/30/2022 0820   GLUCOSE 90 04/30/2022 0820   GLUCOSE 112 (H) 06/04/2019 0413   BUN 24 04/30/2022 0820   CREATININE 1.36 (H) 04/30/2022 0820   CALCIUM 9.8 04/30/2022 0820   PROT 7.1 05/21/2019 1144   ALBUMIN 4.4 05/21/2019 1144   AST 26 05/21/2019 1144   ALT 22 05/21/2019 1144   ALKPHOS 69 05/21/2019 1144   BILITOT 0.5 05/21/2019 1144   GFRNONAA 58 (L) 06/04/2019 0413   GFRAA >60 06/04/2019 0413   Lab Results  Component Value Date   CHOL 128 04/30/2022   HDL 65 04/30/2022   LDLCALC 42 04/30/2022   TRIG 117 04/30/2022   CHOLHDL 2.0 04/30/2022   Lab Results  Component Value Date   HGBA1C 5.0 05/21/2019   No results found for: "VITAMINB12" Lab Results  Component Value Date   TSH 2.950 04/30/2022     ASSESSMENT AND PLAN 78 y.o. year old female  has a past medical history of Arthritis, Bulging lumbar disc, Cancer (HCC), Degeneration of lumbar or lumbosacral intervertebral disc (01/21/2015), Essential hypertension (01/21/2015),  GERD (gastroesophageal reflux disease), GERD (gastroesophageal reflux disease) (01/21/2015), Heart murmur, Hyperlipidemia, Hyperlipidemia  (01/21/2015), Hypertension, Pre-diabetes, and Ulcerative colitis (HCC). here with     ICD-10-CM   1. OSA on CPAP  G47.33        Lyan Holck Bednarczyk is doing well on CPAP therapy. Compliance report reveals ***. *** was encouraged to continue using CPAP nightly and for greater than 4 hours each night. We will update supply orders as indicated. Risks of untreated sleep apnea review and education materials provided. Healthy lifestyle habits encouraged. *** will follow up in ***, sooner if needed. *** verbalizes understanding and agreement with this plan.    No orders of the defined types were placed in this encounter.    No orders of the defined types were placed in this encounter.     Terrilyn Fick, FNP-C 01/15/2023, 1:25 PM [facility] [address] [place], [place] [address] [phone]

## 2023-01-15 NOTE — Telephone Encounter (Signed)
 Called pt and let her know that Bethany and I called [facility] and they didn't receive her order, nor could they find her order. Told pt her order has been resubmitted to [facility] today 01/15/2023 and has been marked urgent. Told pt that we will have to reschedule her 3 months out for her Initial CPAP F/U. Pt verbalized understanding.

## 2023-01-16 ENCOUNTER — Ambulatory Visit: Payer: Medicaid Other | Admitting: Family Medicine

## 2023-01-16 ENCOUNTER — Telehealth: Payer: Self-pay | Admitting: Neurology

## 2023-01-16 NOTE — Telephone Encounter (Signed)
 Donna [name] has Tuesday 4/8 at 1:30 pm open. Can you call her and offer that one? I saw other availability with Donna [name] as well for CPAP appts.

## 2023-01-16 NOTE — Telephone Encounter (Signed)
 Rescheduled patient's CPAP appt on 04/10/23 at 9:00 am

## 2023-01-16 NOTE — Telephone Encounter (Signed)
 Pt states she received her cpap machine yesterday 1/14 which changes her date range to 2/13-4/14. Needs to reschedule initial cpap appt but no availability showing within dates needed. Requesting call back

## 2023-01-24 ENCOUNTER — Other Ambulatory Visit: Payer: Self-pay

## 2023-01-24 NOTE — Progress Notes (Signed)
Specialty Pharmacy Refill Coordination Note  Donna [name] is a 78 y.o. female contacted today regarding refills of specialty medication(s) Evolocumab (Repatha SureClick)   Patient requested Delivery   Delivery date: 02/07/23   Verified address: [address]  [place] [address]   Medication will be filled on 02/06/23.

## 2023-01-25 DIAGNOSIS — Z01419 Encounter for gynecological examination (general) (routine) without abnormal findings: Secondary | ICD-10-CM | POA: Diagnosis not present

## 2023-01-25 DIAGNOSIS — Z6835 Body mass index (BMI) 35.0-35.9, adult: Secondary | ICD-10-CM | POA: Diagnosis not present

## 2023-01-25 DIAGNOSIS — N952 Postmenopausal atrophic vaginitis: Secondary | ICD-10-CM | POA: Diagnosis not present

## 2023-01-29 DIAGNOSIS — K5190 Ulcerative colitis, unspecified, without complications: Secondary | ICD-10-CM | POA: Diagnosis not present

## 2023-01-29 DIAGNOSIS — Z932 Ileostomy status: Secondary | ICD-10-CM | POA: Diagnosis not present

## 2023-02-11 DIAGNOSIS — G4733 Obstructive sleep apnea (adult) (pediatric): Secondary | ICD-10-CM | POA: Diagnosis not present

## 2023-02-11 DIAGNOSIS — Z780 Asymptomatic menopausal state: Secondary | ICD-10-CM | POA: Diagnosis not present

## 2023-02-19 DIAGNOSIS — H02831 Dermatochalasis of right upper eyelid: Secondary | ICD-10-CM | POA: Diagnosis not present

## 2023-02-19 DIAGNOSIS — H40053 Ocular hypertension, bilateral: Secondary | ICD-10-CM | POA: Diagnosis not present

## 2023-02-19 DIAGNOSIS — H04123 Dry eye syndrome of bilateral lacrimal glands: Secondary | ICD-10-CM | POA: Diagnosis not present

## 2023-02-19 DIAGNOSIS — H02834 Dermatochalasis of left upper eyelid: Secondary | ICD-10-CM | POA: Diagnosis not present

## 2023-02-19 DIAGNOSIS — H2511 Age-related nuclear cataract, right eye: Secondary | ICD-10-CM | POA: Diagnosis not present

## 2023-02-28 DIAGNOSIS — K5190 Ulcerative colitis, unspecified, without complications: Secondary | ICD-10-CM | POA: Diagnosis not present

## 2023-02-28 DIAGNOSIS — Z932 Ileostomy status: Secondary | ICD-10-CM | POA: Diagnosis not present

## 2023-03-01 ENCOUNTER — Other Ambulatory Visit: Payer: Self-pay | Admitting: Pharmacy Technician

## 2023-03-01 ENCOUNTER — Other Ambulatory Visit: Payer: Self-pay

## 2023-03-01 NOTE — Progress Notes (Signed)
 Specialty Pharmacy Refill Coordination Note  Donna [name] is a 78 y.o. female contacted today regarding refills of specialty medication(s) Evolocumab (Repatha SureClick)   Patient requested No data recorded  Delivery date: 03/06/23   Verified address: Patient address [address]  [place]   Medication will be filled on 03/05/23.

## 2023-03-05 ENCOUNTER — Other Ambulatory Visit: Payer: Self-pay

## 2023-03-07 DIAGNOSIS — H2511 Age-related nuclear cataract, right eye: Secondary | ICD-10-CM | POA: Diagnosis not present

## 2023-03-08 ENCOUNTER — Other Ambulatory Visit: Payer: Self-pay | Admitting: Cardiology

## 2023-03-08 DIAGNOSIS — I10 Essential (primary) hypertension: Secondary | ICD-10-CM

## 2023-03-28 ENCOUNTER — Other Ambulatory Visit: Payer: Self-pay

## 2023-03-28 ENCOUNTER — Other Ambulatory Visit: Payer: Self-pay | Admitting: Cardiology

## 2023-03-28 DIAGNOSIS — K5190 Ulcerative colitis, unspecified, without complications: Secondary | ICD-10-CM | POA: Diagnosis not present

## 2023-03-28 DIAGNOSIS — Z932 Ileostomy status: Secondary | ICD-10-CM | POA: Diagnosis not present

## 2023-03-28 DIAGNOSIS — E782 Mixed hyperlipidemia: Secondary | ICD-10-CM

## 2023-03-28 DIAGNOSIS — R931 Abnormal findings on diagnostic imaging of heart and coronary circulation: Secondary | ICD-10-CM

## 2023-03-28 MED ORDER — REPATHA SURECLICK 140 MG/ML ~~LOC~~ SOAJ
SUBCUTANEOUS | 0 refills | Status: DC
Start: 2023-03-28 — End: 2023-07-04
  Filled 2023-03-29: qty 2, 28d supply, fill #0
  Filled 2023-04-29: qty 2, 28d supply, fill #1
  Filled 2023-06-06: qty 2, 28d supply, fill #2

## 2023-03-28 NOTE — Progress Notes (Signed)
 Specialty Pharmacy Refill Coordination Note  Donna [name] is a 78 y.o. female contacted today regarding refills of specialty medication(s) Evolocumab (Repatha SureClick)   Patient requested (Patient-Rptd) Delivery   Delivery date: (Patient-Rptd) 04/05/23   Verified address: (Patient-Rptd) [address] [place], [place] [address]   Medication will be filled on 04/04/23.   This fill date is pending response to refill request from provider. Patient was left a voice mail and if they have not received fill by intended date they must follow up with pharmacy.   Patient requested to speak to a pharmacist however the pharmacy was not able to reach patient. Patient was left a voice mail to contact the pharmacy should she have any questions or concerns.

## 2023-03-29 ENCOUNTER — Other Ambulatory Visit: Payer: Self-pay

## 2023-04-02 DIAGNOSIS — M5416 Radiculopathy, lumbar region: Secondary | ICD-10-CM | POA: Diagnosis not present

## 2023-04-04 DIAGNOSIS — M5416 Radiculopathy, lumbar region: Secondary | ICD-10-CM | POA: Diagnosis not present

## 2023-04-05 ENCOUNTER — Ambulatory Visit: Payer: Medicare HMO | Admitting: Primary Care

## 2023-04-05 ENCOUNTER — Telehealth: Payer: Self-pay

## 2023-04-05 ENCOUNTER — Encounter: Payer: Self-pay | Admitting: Primary Care

## 2023-04-05 VITALS — BP 158/78 | HR 70 | Temp 96.9°F | Ht 61.0 in | Wt 187.4 lb

## 2023-04-05 DIAGNOSIS — G4730 Sleep apnea, unspecified: Secondary | ICD-10-CM | POA: Diagnosis not present

## 2023-04-05 DIAGNOSIS — G4700 Insomnia, unspecified: Secondary | ICD-10-CM

## 2023-04-05 NOTE — Telephone Encounter (Signed)
 I called and spoke to [facility] from [facility]. Marthann Schiller has our office tagged in [facility], and we can see pt's compliance report.

## 2023-04-05 NOTE — Progress Notes (Signed)
 @Patient  ID: Donna [name], female    DOB: 07/17/1945, 78 y.o.   MRN: [id number]  Chief Complaint  Patient presents with   Follow-up    OSA Consult    Referring provider: Imelda Man, MD  HPI: 78 year old female, never smoked.  Past medical history significant for primary hypertension, elevated coronary artery calcium score, GERD, carpal tunnel syndrome, osteoarthritis, hyperlipidemia, status post colectomy.  04/05/2023 Discussed the use of AI scribe software for clinical note transcription with the patient, who gave verbal consent to proceed.  History of Present Illness   Donna [name] is a 78 year old female with obstructive sleep apnea who presents for a sleep consult. She was referred  for management of her CPAP therapy.  She has been using a CPAP machine for approximately two to three months, following a sleep study conducted on September 24, 2022, which revealed 12.9 apneic events per hour, with severe apnea during REM sleep (REM AHI of 36 events per hour) and a lowest oxygen saturation of 86%. The CPAP machine was prescribed by Dr. Schuyler Custard, and she has been using it consistently, wearing it six out of seven nights per week, with an average usage of 4.3 hours per night. The pressure setting on her CPAP is 9.3, with an average air leak of 10 liters per minute, and her current AHI is 0.7, indicating effective control of her apneic events.  She experiences discomfort with the nasal mask, which 'hurts my nose', and she is unsure if she is a mouth breather. She has not received any supplies for her CPAP machine and is not currently enrolled in a monitoring program like [employer].  The CPAP wakes her up after about four hours of use, and she has difficulty returning to sleep, often staying awake until 7 or 8 AM. She has been prescribed a sleeping pill by Dr. Lucina Sabal, which she takes occasionally when she is 'really restless'. The medication helps her stay asleep, but she is cautious  about using it regularly to avoid dependency. She feels tired during the day, attributing it to her inadequate sleep.  In addition to her sleep apnea, she mentions having a 'calcium buildup' in her arteries, suggesting a history of coronary artery disease or atherosclerosis, although she is unsure of the specific diagnosis.  She also has a colostomy, which requires her to wake up multiple times at night, contributing to her sleep disturbances.     Airview download 03/05/2023 - 04/03/2023 Usage days 23/30 days (77%); 21 days (70%) Average usage days used 4 hours 27 minutes Pressure 6 to 11 cm H2O (9.3 cm H2O-95%) Air leaks 9.2 L/min (95%) AHI 0.7   Allergies  Allergen Reactions   Iodinated Contrast Media Other (See Comments)    Feels faint  coughing, bp elevated   Mesalamine Other (See Comments)   Ozempic (0.25 Or 0.5 Mg-Dose) [Semaglutide(0.25 Or 0.5mg -Dos)] Nausea And Vomiting   Prilosec [Omeprazole]     Made knees hurt and made me sick   Statins Hives and Other (See Comments)    Muscle spasms     Immunization History  Administered Date(s) Administered   Influenza-Unspecified 11/13/2014, 10/01/2017    Past Medical History:  Diagnosis Date   Arthritis    Bulging lumbar disc    Cancer (HCC)    colon   Degeneration of lumbar or lumbosacral intervertebral disc 01/21/2015   Essential hypertension 01/21/2015   GERD (gastroesophageal reflux disease)    GERD (gastroesophageal reflux disease) 01/21/2015  Heart murmur    Hyperlipidemia    Hyperlipidemia 01/21/2015   Hypertension    Pre-diabetes    diet   Ulcerative colitis (HCC)     Tobacco History: Social History   Tobacco Use  Smoking Status Never  Smokeless Tobacco Never   Counseling given: Not Answered   Outpatient Medications Prior to Visit  Medication Sig Dispense Refill   acetaminophen (TYLENOL) 500 MG tablet Take 1,000 mg by mouth every 6 (six) hours as needed for mild pain or headache.     amLODipine  (NORVASC) 10 MG tablet Take 10 mg by mouth daily.      aspirin EC 81 MG tablet Take 1 tablet (81 mg total) by mouth daily. Swallow whole. 90 tablet 3   B Complex Vitamins (B COMPLEX 100 PO) Take 1 tablet by mouth daily.     benazepril (LOTENSIN) 40 MG tablet Take 40 mg by mouth at bedtime.     calcium carbonate (OSCAL) 1500 (600 Ca) MG TABS tablet Take 600 mg of elemental calcium by mouth 2 (two) times daily with a meal.     Evolocumab (REPATHA SURECLICK) 140 MG/ML SOAJ Inject 140 mg (1 pen) into the skin every 14 days 6 mL 0   hydrochlorothiazide (HYDRODIURIL) 25 MG tablet Take 25 mg by mouth daily.     labetalol (NORMODYNE) 100 MG tablet Take 1 tablet (100 mg total) by mouth 2 (two) times daily. Pt needs to keep upcoming appt in April to prevent any missed doses 60 tablet 1   latanoprost (XALATAN) 0.005 % ophthalmic solution Place 1 drop into both eyes at bedtime.     loperamide (IMODIUM) 2 MG capsule Take 1 capsule (2 mg total) by mouth 2 (two) times daily. (Patient taking differently: Take 4 mg by mouth in the morning and at bedtime.) 60 capsule 1   Menthol-Methyl Salicylate (MUSCLE RUB) 10-15 % CREA Apply 1 application topically 2 (two) times daily as needed for muscle pain.     Multiple Vitamin (MULTIVITAMIN WITH MINERALS) TABS tablet Take 1 tablet by mouth daily. Woman's one a day     pantoprazole (PROTONIX) 40 MG tablet Take 40 mg by mouth 2 (two) times daily before a meal.      psyllium (HYDROCIL/METAMUCIL) 95 % PACK Take 1 packet by mouth daily. (Patient taking differently: Take 1 packet by mouth 2 (two) times daily.) 30 packet 1   timolol (TIMOPTIC) 0.5 % ophthalmic solution Place 1 drop into both eyes every morning.     traMADol (ULTRAM) 50 MG tablet Take 50 mg by mouth every 6 (six) hours as needed for severe pain.     No facility-administered medications prior to visit.   Review of Systems  Review of Systems  Constitutional:  Positive for fatigue.  HENT: Negative.     Respiratory: Negative.    Psychiatric/Behavioral:  Positive for sleep disturbance.      Physical Exam  Pulse 70   SpO2 98%  Physical Exam Constitutional:      Appearance: Normal appearance.  HENT:     Head: Normocephalic and atraumatic.     Mouth/Throat:     Mouth: Mucous membranes are moist.     Pharynx: Oropharynx is clear.  Cardiovascular:     Rate and Rhythm: Normal rate and regular rhythm.     Heart sounds: Murmur heard.  Pulmonary:     Effort: Pulmonary effort is normal.     Breath sounds: Normal breath sounds.  Musculoskeletal:  General: Normal range of motion.  Skin:    General: Skin is warm and dry.  Neurological:     General: No focal deficit present.     Mental Status: She is alert and oriented to person, place, and time. Mental status is at baseline.  Psychiatric:        Mood and Affect: Mood normal.        Behavior: Behavior normal.        Thought Content: Thought content normal.        Judgment: Judgment normal.      Lab Results:  CBC    Component Value Date/Time   WBC 3.9 04/30/2022 0820   WBC 7.0 06/01/2019 0419   RBC 3.74 (L) 04/30/2022 0820   RBC 3.33 (L) 06/01/2019 0419   HGB 11.0 (L) 04/30/2022 0820   HCT 34.7 04/30/2022 0820   PLT 251 04/30/2022 0820   MCV 93 04/30/2022 0820   MCH 29.4 04/30/2022 0820   MCH 31.2 06/01/2019 0419   MCHC 31.7 04/30/2022 0820   MCHC 32.6 06/01/2019 0419   RDW 13.8 04/30/2022 0820   LYMPHSABS 0.9 05/21/2019 1144   MONOABS 0.5 05/21/2019 1144   EOSABS 0.2 05/21/2019 1144   BASOSABS 0.0 05/21/2019 1144    BMET    Component Value Date/Time   NA 144 04/30/2022 0820   K 4.3 04/30/2022 0820   CL 107 (H) 04/30/2022 0820   CO2 22 04/30/2022 0820   GLUCOSE 90 04/30/2022 0820   GLUCOSE 112 (H) 06/04/2019 0413   BUN 24 04/30/2022 0820   CREATININE 1.36 (H) 04/30/2022 0820   CALCIUM 9.8 04/30/2022 0820   GFRNONAA 58 (L) 06/04/2019 0413   GFRAA >60 06/04/2019 0413    BNP No results found for:  "BNP"  ProBNP No results found for: "PROBNP"  Imaging: No results found.   Assessment & Plan:   1. Sleep apnea, unspecified type (Primary)  2. Insomnia, unspecified type   Assessment and Plan    Obstructive Sleep Apnea Sleep study on September 24, 2022 revealed mild OSA with 12.9 apneic events per hour. She had severe apnea during REM sleep (REM AHI of 36 events per hour) and a lowest oxygen saturation of 86%. Started on CPAP 6 months ago by PCP. Patient is 70% compliant with CPAP use over the last 30 days > 4 hours. CPAP pressure 6-11cm h20 with improved AHI to 0.7/hour, indicating effective apnea control. Nasal mask discomfort noted, affecting usage duration. CPAP crucial for cardiovascular health. - Enroll CPAP machine in [employer] program for monitoring. - Consider full face mask if nasal discomfort persists. - Encourage consistent CPAP use nightly 4-6 hours.  Insomnia Patient reports difficulty maintaining sleep. Sleeping pill effective but patient has concerns about dependency and does not take regularly despite insomnia symptoms.  - Advise nightly sleeping pill for two weeks, then taper to as needed or alternate nights. - Ensure CPAP use shortly after taking sleeping pill.  Colostomy Contributes to nocturnal awakenings.  Follow-up Necessary for management of sleep apnea and insomnia. - Schedule follow-up in 1-2 months to reassess CPAP efficacy and sleep quality.      Antonio Baumgarten, NP 04/05/2023

## 2023-04-05 NOTE — Patient Instructions (Signed)
 -  OBSTRUCTIVE SLEEP APNEA: Obstructive sleep apnea is a condition where your breathing stops and starts repeatedly during sleep due to blocked airways. Your CPAP machine is effectively controlling your apnea, but you are experiencing discomfort with the nasal mask. We will enroll your CPAP machine in the [employer]view program for better monitoring and consider switching to a full face mask if the nasal discomfort continues. It's important to use your CPAP consistently every night for your cardiovascular health.  -INSOMNIA: Insomnia is difficulty falling or staying asleep. You have trouble staying asleep after using your CPAP machine. We recommend taking your sleeping pill every night for two weeks and then tapering to as needed or every other night. Make sure to use your CPAP machine shortly after taking the sleeping pill.  Orders: Please enroll patient in airview OR provide patient with SD card  Follow-up 3 months with [facility] NP or sooner

## 2023-04-09 NOTE — Patient Instructions (Incomplete)
Please continue using your CPAP regularly. While your insurance requires that you use CPAP at least 4 hours each night on 70% of the nights, I recommend, that you not skip any nights and use it throughout the night if you can. Getting used to CPAP and staying with the treatment long term does take time and patience and discipline. Untreated obstructive sleep apnea when it is moderate to severe can have an adverse impact on cardiovascular health and raise her risk for heart disease, arrhythmias, hypertension, congestive heart failure, stroke and diabetes. Untreated obstructive sleep apnea causes sleep disruption, nonrestorative sleep, and sleep deprivation. This can have an impact on your day to day functioning and cause daytime sleepiness and impairment of cognitive function, memory loss, mood disturbance, and problems focussing. Using CPAP regularly can improve these symptoms.  We will update supply orders, today.   Follow up in 6 months

## 2023-04-09 NOTE — Progress Notes (Unsigned)
 Donna [name]

## 2023-04-09 NOTE — Progress Notes (Unsigned)
 PATIENT: Donna [name] ZOXWRU DOB: 1945/11/10  REASON FOR VISIT: follow up HISTORY FROM: patient  No chief complaint on file.    HISTORY OF PRESENT ILLNESS:  04/09/23 ALL:  Donna [name] is a 78 y.o. female here today for follow up for OSA on CPAP.  She was last seen in consult with Dr Frances Furbish for history of OSA on CPAP with disrupted sleep and excessive daytime sleepiness. PSG 09/2022 showed overall mild obstructive sleep apnea,  severe in REM sleep with a total AHI of 12.9/hour, REM AHI of 36.8/hour, supine AHI of 13.4/hour and O2 nadir of 86%. New autoPAP advised.      HISTORY: (copied from Dr Teofilo Pod previous note)  Dear Dr. Rosemary Holms,   I saw your patient, Donna [name], upon your kind request in my sleep clinic today for initial consultation of her sleep disorder, in particular, concern for underlying obstructive sleep apnea.  The patient is unaccompanied today.  As you know, [name] is a 78 year old female with an underlying medical history of hypertension, hyperlipidemia, exertional dyspnea, arthritis, with s/p L TKA, LBP, colon cancer, reflux disease, prediabetes, ulcerative colitis with s/p total colectomy, and mild obesity, who reports sleep disruption and excessive daytime somnolence.  Her Epworth sleepiness score is 11 out of 24, fatigue severity score is 43 out of 63.  I reviewed your office note from 04/26/2022.  Patient has sleep apnea and uses a CPAP machine.  She has a variable sleep schedule, generally goes to bed before midnight and rise time is around 8 but she is often awake in the middle of the night and has trouble maintaining sleep.  She has a colostomy bag and empties it once or twice per night and also urinates about twice or once per night on average.  She denies recurrent nocturnal or morning headaches.  She lives with her middle son, her oldest son has sleep apnea.  She has a total of 3 sons.  She works for [employer] for over 44 years.  She is divorced.   She is a non-smoker and does not drink any alcohol, she limits her caffeine to 1 cup of coffee per day on average.   REVIEW OF SYSTEMS: Out of a complete 14 system review of symptoms, the patient complains only of the following symptoms, and all other reviewed systems are negative.  ESS:  ALLERGIES: Allergies  Allergen Reactions   Iodinated Contrast Media Other (See Comments)    Feels faint  coughing, bp elevated   Mesalamine Other (See Comments)   Ozempic (0.25 Or 0.5 Mg-Dose) [Semaglutide(0.25 Or 0.5mg -Dos)] Nausea And Vomiting   Prilosec [Omeprazole]     Made knees hurt and made me sick   Statins Hives and Other (See Comments)    Muscle spasms     HOME MEDICATIONS: Outpatient Medications Prior to Visit  Medication Sig Dispense Refill   acetaminophen (TYLENOL) 500 MG tablet Take 1,000 mg by mouth every 6 (six) hours as needed for mild pain or headache.     amLODipine (NORVASC) 10 MG tablet Take 10 mg by mouth daily.      aspirin EC 81 MG tablet Take 1 tablet (81 mg total) by mouth daily. Swallow whole. 90 tablet 3   B Complex Vitamins (B COMPLEX 100 PO) Take 1 tablet by mouth daily.     benazepril (LOTENSIN) 40 MG tablet Take 40 mg by mouth at bedtime.     calcium carbonate (OSCAL) 1500 (600 Ca) MG TABS tablet Take 600  mg of elemental calcium by mouth 2 (two) times daily with a meal.     Evolocumab (REPATHA SURECLICK) 140 MG/ML SOAJ Inject 140 mg (1 pen) into the skin every 14 days 6 mL 0   hydrochlorothiazide (HYDRODIURIL) 25 MG tablet Take 25 mg by mouth daily.     labetalol (NORMODYNE) 100 MG tablet Take 1 tablet (100 mg total) by mouth 2 (two) times daily. Pt needs to keep upcoming appt in April to prevent any missed doses 60 tablet 1   latanoprost (XALATAN) 0.005 % ophthalmic solution Place 1 drop into both eyes at bedtime.     loperamide (IMODIUM) 2 MG capsule Take 1 capsule (2 mg total) by mouth 2 (two) times daily. (Patient taking differently: Take 4 mg by mouth in the  morning and at bedtime.) 60 capsule 1   Menthol-Methyl Salicylate (MUSCLE RUB) 10-15 % CREA Apply 1 application topically 2 (two) times daily as needed for muscle pain.     Multiple Vitamin (MULTIVITAMIN WITH MINERALS) TABS tablet Take 1 tablet by mouth daily. Woman's one a day     pantoprazole (PROTONIX) 40 MG tablet Take 40 mg by mouth 2 (two) times daily before a meal.      psyllium (HYDROCIL/METAMUCIL) 95 % PACK Take 1 packet by mouth daily. (Patient taking differently: Take 1 packet by mouth 2 (two) times daily.) 30 packet 1   timolol (TIMOPTIC) 0.5 % ophthalmic solution Place 1 drop into both eyes every morning.     traMADol (ULTRAM) 50 MG tablet Take 50 mg by mouth every 6 (six) hours as needed for severe pain.     No facility-administered medications prior to visit.    PAST MEDICAL HISTORY: Past Medical History:  Diagnosis Date   Arthritis    Bulging lumbar disc    Cancer (HCC)    colon   Degeneration of lumbar or lumbosacral intervertebral disc 01/21/2015   Essential hypertension 01/21/2015   GERD (gastroesophageal reflux disease)    GERD (gastroesophageal reflux disease) 01/21/2015   Heart murmur    Hyperlipidemia    Hyperlipidemia 01/21/2015   Hypertension    Pre-diabetes    diet   Ulcerative colitis (HCC)     PAST SURGICAL HISTORY: Past Surgical History:  Procedure Laterality Date   BIOPSY  06/29/2022   Procedure: BIOPSY;  Surgeon: Andria Meuse, MD;  Location: [facility];  Service: General;;   CESAREAN SECTION     COLON SURGERY     FLEXIBLE SIGMOIDOSCOPY N/A 05/18/2020   Procedure: SURVEILLANCE FLEXIBLE SIGMOIDOSCOPY;  Surgeon: Andria Meuse, MD;  Location: [facility];  Service: General;  Laterality: N/A;   FLEXIBLE SIGMOIDOSCOPY N/A 06/29/2022   Procedure: Arnell Sieving;  Surgeon: Andria Meuse, MD;  Location: [facility];  Service: General;  Laterality: N/A;   INCISIONAL HERNIA REPAIR N/A 05/29/2019   Procedure: PRIMARY INCISIONAL  HERNIA REPAIR;  Surgeon: Andria Meuse, MD;  Location: [facility];  Service: General;  Laterality: N/A;   LAPAROTOMY Right 01/12/2015   Procedure: OPEN RIGHT COLECTOMY;  Surgeon: Emelia Loron, MD;  Location: [facility];  Service: General;  Laterality: Right;   RIGHT COLECTOMY  01/12/2015   TOTAL KNEE ARTHROPLASTY Left 06/20/2018   Procedure: TOTAL KNEE ARTHROPLASTY;  Surgeon: Jodi Geralds, MD;  Location: [facility];  Service: Orthopedics;  Laterality: Left;    FAMILY HISTORY: Family History  Problem Relation Age of Onset   Diabetes Mother    Ulcerative colitis Father    Heart failure Sister  Sleep apnea Son     SOCIAL HISTORY: Social History   Socioeconomic History   Marital status: Divorced    Spouse name: Not on file   Number of children: 3   Years of education: Not on file   Highest education level: Not on file  Occupational History   Not on file  Tobacco Use   Smoking status: Never   Smokeless tobacco: Never  Vaping Use   Vaping status: Never Used  Substance and Sexual Activity   Alcohol use: No   Drug use: No   Sexual activity: Not on file  Other Topics Concern   Not on file  Social History Narrative   Not on file   Social Drivers of Health   Financial Resource Strain: Not on file  Food Insecurity: No Food Insecurity (11/03/2021)   Hunger Vital Sign    Worried About Running Out of Food in the Last Year: Never true    Ran Out of Food in the Last Year: Never true  Transportation Needs: No Transportation Needs (08/02/2021)   PRAPARE - Administrator, Civil Service (Medical): No    Lack of Transportation (Non-Medical): No  Physical Activity: Not on file  Stress: Not on file  Social Connections: Not on file  Intimate Partner Violence: Not on file     PHYSICAL EXAM  There were no vitals filed for this visit. There is no height or weight on file to calculate BMI.  Generalized: Well developed, in no acute distress  Cardiology: normal rate and  rhythm, no murmur noted Respiratory: clear to auscultation bilaterally  Neurological examination  Mentation: Alert oriented to time, place, history taking. Follows all commands speech and language fluent Cranial nerve II-XII: Pupils were equal round reactive to light. Extraocular movements were full, visual field were full  Motor: The motor testing reveals 5 over 5 strength of all 4 extremities. Good symmetric motor tone is noted throughout.  Gait and station: Gait is normal.    DIAGNOSTIC DATA (LABS, IMAGING, TESTING) - I reviewed patient records, labs, notes, testing and imaging myself where available.      No data to display           Lab Results  Component Value Date   WBC 3.9 04/30/2022   HGB 11.0 (L) 04/30/2022   HCT 34.7 04/30/2022   MCV 93 04/30/2022   PLT 251 04/30/2022      Component Value Date/Time   NA 144 04/30/2022 0820   K 4.3 04/30/2022 0820   CL 107 (H) 04/30/2022 0820   CO2 22 04/30/2022 0820   GLUCOSE 90 04/30/2022 0820   GLUCOSE 112 (H) 06/04/2019 0413   BUN 24 04/30/2022 0820   CREATININE 1.36 (H) 04/30/2022 0820   CALCIUM 9.8 04/30/2022 0820   PROT 7.1 05/21/2019 1144   ALBUMIN 4.4 05/21/2019 1144   AST 26 05/21/2019 1144   ALT 22 05/21/2019 1144   ALKPHOS 69 05/21/2019 1144   BILITOT 0.5 05/21/2019 1144   GFRNONAA 58 (L) 06/04/2019 0413   GFRAA >60 06/04/2019 0413   Lab Results  Component Value Date   CHOL 128 04/30/2022   HDL 65 04/30/2022   LDLCALC 42 04/30/2022   TRIG 117 04/30/2022   CHOLHDL 2.0 04/30/2022   Lab Results  Component Value Date   HGBA1C 5.0 05/21/2019   No results found for: "VITAMINB12" Lab Results  Component Value Date   TSH 2.950 04/30/2022     ASSESSMENT AND PLAN 78 y.o. year  old female  has a past medical history of Arthritis, Bulging lumbar disc, Cancer (HCC), Degeneration of lumbar or lumbosacral intervertebral disc (01/21/2015), Essential hypertension (01/21/2015), GERD (gastroesophageal reflux disease),  GERD (gastroesophageal reflux disease) (01/21/2015), Heart murmur, Hyperlipidemia, Hyperlipidemia (01/21/2015), Hypertension, Pre-diabetes, and Ulcerative colitis (HCC). here with   No diagnosis found.    Breyonna Nault Sze is doing well on CPAP therapy. Compliance report reveals ***. *** was encouraged to continue using CPAP nightly and for greater than 4 hours each night. We will update supply orders as indicated. Risks of untreated sleep apnea review and education materials provided. Healthy lifestyle habits encouraged. *** will follow up in ***, sooner if needed. *** verbalizes understanding and agreement with this plan.    No orders of the defined types were placed in this encounter.    No orders of the defined types were placed in this encounter.     Shawnie Dapper, FNP-C 04/09/2023, 12:36 PM [facility] [address] [place], [place] [address] [phone]

## 2023-04-10 ENCOUNTER — Encounter: Payer: Self-pay | Admitting: Cardiology

## 2023-04-10 ENCOUNTER — Encounter: Payer: Self-pay | Admitting: Family Medicine

## 2023-04-10 ENCOUNTER — Other Ambulatory Visit (HOSPITAL_COMMUNITY): Payer: Self-pay

## 2023-04-10 ENCOUNTER — Ambulatory Visit: Payer: Medicaid Other | Admitting: Family Medicine

## 2023-04-10 ENCOUNTER — Ambulatory Visit: Attending: Cardiology | Admitting: Cardiology

## 2023-04-10 VITALS — BP 120/68 | HR 71 | Resp 16 | Ht 61.0 in | Wt 184.6 lb

## 2023-04-10 VITALS — BP 134/74 | HR 71 | Ht 61.0 in | Wt 184.0 lb

## 2023-04-10 DIAGNOSIS — G4733 Obstructive sleep apnea (adult) (pediatric): Secondary | ICD-10-CM | POA: Diagnosis not present

## 2023-04-10 DIAGNOSIS — I10 Essential (primary) hypertension: Secondary | ICD-10-CM

## 2023-04-10 DIAGNOSIS — E782 Mixed hyperlipidemia: Secondary | ICD-10-CM | POA: Diagnosis not present

## 2023-04-10 NOTE — Patient Instructions (Signed)
 Testing/Procedures: Echo  Your physician has requested that you have an echocardiogram. Echocardiography is a painless test that uses sound waves to create images of your heart. It provides your doctor with information about the size and shape of your heart and how well your heart's chambers and valves are working. This procedure takes approximately one hour. There are no restrictions for this procedure. Please do NOT wear cologne, perfume, aftershave, or lotions (deodorant is allowed). Please arrive 15 minutes prior to your appointment time.  Please note: We ask at that you not bring children with you during ultrasound (echo/ vascular) testing. Due to room size and safety concerns, children are not allowed in the ultrasound rooms during exams. Our front office staff cannot provide observation of children in our lobby area while testing is being conducted. An adult accompanying a patient to their appointment will only be allowed in the ultrasound room at the discretion of the ultrasound technician under special circumstances. We apologize for any inconvenience.   Follow-Up: At [facility], you and your health needs are our priority.  As part of our continuing mission to provide you with exceptional heart care, our providers are all part of one team.  This team includes your primary Cardiologist (physician) and Advanced Practice Providers or APPs (Physician Assistants and Nurse Practitioners) who all work together to provide you with the care you need, when you need it.  Your next appointment:   As needed   Provider:   Elder Negus, MD     We recommend signing up for the patient portal called "MyChart".  Sign up information is provided on this After Visit Summary.  MyChart is used to connect with patients for Virtual Visits (Telemedicine).  Patients are able to view lab/test results, encounter notes, upcoming appointments, etc.  Non-urgent messages can be sent to your provider as  well.   To learn more about what you can do with MyChart, go to [email or web address].   Other Instructions      1st Floor: - Lobby - Registration  - Pharmacy  - Lab - Cafe  2nd Floor: - PV Lab - Diagnostic Testing (echo, CT, nuclear med)  3rd Floor: - Vacant  4th Floor: - TCTS (cardiothoracic surgery) - AFib Clinic - Structural Heart Clinic - Vascular Surgery  - Vascular Ultrasound  5th Floor: - HeartCare Cardiology (general and EP) - Clinical Pharmacy for coumadin, hypertension, lipid, weight-loss medications, and med management appointments    Valet parking services will be available as well.

## 2023-04-10 NOTE — Progress Notes (Signed)
  Cardiology Office Note:  .   Date:  04/10/2023  ID:  Donna [name], DOB 01/20/1945, MRN [id number] PCP: Merri Brunette, MD   HeartCare Providers Cardiologist:  Truett Mainland, MD PCP: Merri Brunette, MD  Chief Complaint  Patient presents with   Dyspnea on exertion   Follow-up     Donna [name] is a 78 y.o. female with hypertension, mixed hyperlipidemia, elevated coronary calcium score (96th percentile)   Patient is doing well.  She denies any chest pain, has mild unchanged exertional dyspnea.  She started using CPAP for OSA.    Vitals:   04/10/23 1100  BP: 120/68  Pulse: 71  Resp: 16  SpO2: 95%      Review of Systems  Cardiovascular:  Positive for dyspnea on exertion (Mild, chronic, unchanged). Negative for chest pain, leg swelling, palpitations and syncope.        Studies Reviewed: Marland Kitchen        EKG 04/10/2023: Normal sinus rhythm Left anterior fascicular block When compared with ECG of 17-Jun-2018 09:13, No significant change was found    Independently interpreted 04/2022-01/2023: Chol 116, TG 109, HDL 54, LDL 42 HbA1C 5.7% Hb 11 Cr 1.36, eGFR 32 TSH 2.9   Physical Exam Vitals and nursing note reviewed.  Constitutional:      General: She is not in acute distress. Neck:     Vascular: No JVD.  Cardiovascular:     Rate and Rhythm: Normal rate and regular rhythm.     Heart sounds: Normal heart sounds. No murmur heard. Pulmonary:     Effort: Pulmonary effort is normal.     Breath sounds: Normal breath sounds. No wheezing or rales.  Musculoskeletal:     Right lower leg: No edema.     Left lower leg: No edema.      VISIT DIAGNOSES:   ICD-10-CM   1. Primary hypertension  I10 EKG 12-Lead    ECHOCARDIOGRAM COMPLETE    2. Mixed hyperlipidemia  E78.2        Donna [name] WUJWJX is a 78 y.o. female with hypertension, mixed hyperlipidemia, elevated coronary calcium score (96th percentile)  Assessment & Plan  Exertional  dyspnea: No ischemia on stress testing (04/2021). Likely related to hypertension and deconditioning. Continue medical management, given multivessel coronary calcium.  In absence of bleeding, continue aspirin, Repatha. Lipids well-controlled. Mild MR, mild TR on previous echocardiogram.  Will check echocardiogram.  Hypertension: Well-controlled.  She is now also using CPAP for OSA.  I suspect her blood pressure may improve further, and she may be able to come down on some of her antihypertensive medications.  No change made today.     Denies any significant changes noted on echocardiogram, given overall stability from cardiac standpoint, she will continue follow-up with Dr. Renne Crigler. I will see her as needed.   Signed, Elder Negus, MD

## 2023-04-24 ENCOUNTER — Other Ambulatory Visit: Payer: Self-pay

## 2023-04-25 DIAGNOSIS — M67912 Unspecified disorder of synovium and tendon, left shoulder: Secondary | ICD-10-CM | POA: Diagnosis not present

## 2023-04-25 DIAGNOSIS — M67911 Unspecified disorder of synovium and tendon, right shoulder: Secondary | ICD-10-CM | POA: Diagnosis not present

## 2023-04-26 ENCOUNTER — Ambulatory Visit: Payer: Self-pay | Admitting: Cardiology

## 2023-04-26 DIAGNOSIS — Z932 Ileostomy status: Secondary | ICD-10-CM | POA: Diagnosis not present

## 2023-04-26 DIAGNOSIS — K5190 Ulcerative colitis, unspecified, without complications: Secondary | ICD-10-CM | POA: Diagnosis not present

## 2023-04-29 ENCOUNTER — Other Ambulatory Visit: Payer: Self-pay | Admitting: Pharmacy Technician

## 2023-04-29 ENCOUNTER — Ambulatory Visit: Payer: Medicaid Other | Admitting: Family Medicine

## 2023-04-29 ENCOUNTER — Other Ambulatory Visit: Payer: Self-pay

## 2023-04-29 NOTE — Progress Notes (Signed)
 Specialty Pharmacy Refill Coordination Note  Lexandra Haseman BJYNWG is a 78 y.o. female contacted today regarding refills of specialty medication(s) Evolocumab  (Repatha  SureClick)   Patient requested Delivery   Delivery date: 05/15/23   Verified address: [address]  [place]   Medication will be filled on 05/14/23.

## 2023-04-30 ENCOUNTER — Ambulatory Visit: Payer: Medicare HMO | Admitting: Cardiology

## 2023-05-03 ENCOUNTER — Other Ambulatory Visit: Payer: Self-pay | Admitting: Cardiology

## 2023-05-03 DIAGNOSIS — I10 Essential (primary) hypertension: Secondary | ICD-10-CM

## 2023-05-06 ENCOUNTER — Ambulatory Visit: Payer: Self-pay | Admitting: Surgery

## 2023-05-06 DIAGNOSIS — K51818 Other ulcerative colitis with other complication: Secondary | ICD-10-CM | POA: Diagnosis not present

## 2023-05-06 DIAGNOSIS — Z932 Ileostomy status: Secondary | ICD-10-CM | POA: Diagnosis not present

## 2023-05-14 ENCOUNTER — Other Ambulatory Visit (HOSPITAL_COMMUNITY): Payer: Self-pay

## 2023-05-14 ENCOUNTER — Other Ambulatory Visit: Payer: Self-pay

## 2023-05-14 NOTE — Progress Notes (Signed)
 [employer] grant info added to patient's profile. Expiration date-04/18/24

## 2023-05-17 ENCOUNTER — Ambulatory Visit (HOSPITAL_COMMUNITY)
Admission: RE | Admit: 2023-05-17 | Discharge: 2023-05-17 | Disposition: A | Discharge status: Home / Self Care | Source: Ambulatory Visit | Attending: Cardiology | Admitting: Cardiology

## 2023-05-17 DIAGNOSIS — I10 Essential (primary) hypertension: Secondary | ICD-10-CM | POA: Insufficient documentation

## 2023-05-18 LAB — ECHOCARDIOGRAM COMPLETE
Area-P 1/2: 4.6 cm2
S' Lateral: 2.48 cm

## 2023-05-19 ENCOUNTER — Ambulatory Visit: Payer: Self-pay | Admitting: Cardiology

## 2023-05-29 DIAGNOSIS — K5190 Ulcerative colitis, unspecified, without complications: Secondary | ICD-10-CM | POA: Diagnosis not present

## 2023-05-29 DIAGNOSIS — R1013 Epigastric pain: Secondary | ICD-10-CM | POA: Diagnosis not present

## 2023-05-29 DIAGNOSIS — Z8719 Personal history of other diseases of the digestive system: Secondary | ICD-10-CM | POA: Diagnosis not present

## 2023-05-29 DIAGNOSIS — K219 Gastro-esophageal reflux disease without esophagitis: Secondary | ICD-10-CM | POA: Diagnosis not present

## 2023-05-29 DIAGNOSIS — Z932 Ileostomy status: Secondary | ICD-10-CM | POA: Diagnosis not present

## 2023-06-05 DIAGNOSIS — Z932 Ileostomy status: Secondary | ICD-10-CM | POA: Diagnosis not present

## 2023-06-05 DIAGNOSIS — K219 Gastro-esophageal reflux disease without esophagitis: Secondary | ICD-10-CM | POA: Diagnosis not present

## 2023-06-05 DIAGNOSIS — R748 Abnormal levels of other serum enzymes: Secondary | ICD-10-CM | POA: Diagnosis not present

## 2023-06-05 DIAGNOSIS — R1013 Epigastric pain: Secondary | ICD-10-CM | POA: Diagnosis not present

## 2023-06-05 DIAGNOSIS — Z8719 Personal history of other diseases of the digestive system: Secondary | ICD-10-CM | POA: Diagnosis not present

## 2023-06-06 ENCOUNTER — Other Ambulatory Visit (HOSPITAL_COMMUNITY): Payer: Self-pay

## 2023-06-12 DIAGNOSIS — K838 Other specified diseases of biliary tract: Secondary | ICD-10-CM | POA: Diagnosis not present

## 2023-06-12 DIAGNOSIS — R748 Abnormal levels of other serum enzymes: Secondary | ICD-10-CM | POA: Diagnosis not present

## 2023-06-12 DIAGNOSIS — K8020 Calculus of gallbladder without cholecystitis without obstruction: Secondary | ICD-10-CM | POA: Diagnosis not present

## 2023-06-14 ENCOUNTER — Other Ambulatory Visit: Payer: Self-pay | Admitting: Pharmacy Technician

## 2023-06-14 ENCOUNTER — Other Ambulatory Visit: Payer: Self-pay

## 2023-06-14 NOTE — Progress Notes (Signed)
 Specialty Pharmacy Refill Coordination Note  Donna [name] is a 78 y.o. female contacted today regarding refills of specialty medication(s) Evolocumab  (Repatha  SureClick)   Patient requested Delivery   Delivery date: 06/11/23   Verified address: [address]  [place]   Medication will be filled on 06/10/23.

## 2023-06-18 DIAGNOSIS — K219 Gastro-esophageal reflux disease without esophagitis: Secondary | ICD-10-CM | POA: Diagnosis not present

## 2023-06-18 DIAGNOSIS — K5100 Ulcerative (chronic) pancolitis without complications: Secondary | ICD-10-CM | POA: Diagnosis not present

## 2023-06-18 DIAGNOSIS — R1314 Dysphagia, pharyngoesophageal phase: Secondary | ICD-10-CM | POA: Diagnosis not present

## 2023-06-18 DIAGNOSIS — R5383 Other fatigue: Secondary | ICD-10-CM | POA: Diagnosis not present

## 2023-06-18 DIAGNOSIS — R1031 Right lower quadrant pain: Secondary | ICD-10-CM | POA: Diagnosis not present

## 2023-06-18 DIAGNOSIS — K222 Esophageal obstruction: Secondary | ICD-10-CM | POA: Diagnosis not present

## 2023-06-18 DIAGNOSIS — R748 Abnormal levels of other serum enzymes: Secondary | ICD-10-CM | POA: Diagnosis not present

## 2023-06-27 DIAGNOSIS — Z932 Ileostomy status: Secondary | ICD-10-CM | POA: Diagnosis not present

## 2023-06-27 DIAGNOSIS — K5190 Ulcerative colitis, unspecified, without complications: Secondary | ICD-10-CM | POA: Diagnosis not present

## 2023-07-02 DIAGNOSIS — H02834 Dermatochalasis of left upper eyelid: Secondary | ICD-10-CM | POA: Diagnosis not present

## 2023-07-02 DIAGNOSIS — H40053 Ocular hypertension, bilateral: Secondary | ICD-10-CM | POA: Diagnosis not present

## 2023-07-02 DIAGNOSIS — H02831 Dermatochalasis of right upper eyelid: Secondary | ICD-10-CM | POA: Diagnosis not present

## 2023-07-02 DIAGNOSIS — H2512 Age-related nuclear cataract, left eye: Secondary | ICD-10-CM | POA: Diagnosis not present

## 2023-07-02 DIAGNOSIS — H04123 Dry eye syndrome of bilateral lacrimal glands: Secondary | ICD-10-CM | POA: Diagnosis not present

## 2023-07-04 ENCOUNTER — Other Ambulatory Visit: Payer: Self-pay

## 2023-07-04 ENCOUNTER — Other Ambulatory Visit: Payer: Self-pay | Admitting: Cardiology

## 2023-07-04 DIAGNOSIS — R931 Abnormal findings on diagnostic imaging of heart and coronary circulation: Secondary | ICD-10-CM

## 2023-07-04 DIAGNOSIS — M75121 Complete rotator cuff tear or rupture of right shoulder, not specified as traumatic: Secondary | ICD-10-CM | POA: Diagnosis not present

## 2023-07-04 DIAGNOSIS — M75122 Complete rotator cuff tear or rupture of left shoulder, not specified as traumatic: Secondary | ICD-10-CM | POA: Diagnosis not present

## 2023-07-04 DIAGNOSIS — E782 Mixed hyperlipidemia: Secondary | ICD-10-CM

## 2023-07-05 DIAGNOSIS — H524 Presbyopia: Secondary | ICD-10-CM | POA: Diagnosis not present

## 2023-07-08 ENCOUNTER — Other Ambulatory Visit (HOSPITAL_COMMUNITY): Payer: Self-pay

## 2023-07-08 ENCOUNTER — Other Ambulatory Visit: Payer: Self-pay | Admitting: Pharmacy Technician

## 2023-07-08 ENCOUNTER — Other Ambulatory Visit: Payer: Self-pay | Admitting: Gastroenterology

## 2023-07-08 ENCOUNTER — Other Ambulatory Visit: Payer: Self-pay

## 2023-07-08 MED ORDER — REPATHA SURECLICK 140 MG/ML ~~LOC~~ SOAJ
SUBCUTANEOUS | 3 refills | Status: AC
  Filled 2023-07-08: qty 2, 28d supply, fill #0
  Filled 2023-07-08: qty 6, 28d supply, fill #0
  Filled 2023-08-01: qty 2, 28d supply, fill #1
  Filled 2023-08-31: qty 2, 28d supply, fill #2
  Filled 2023-09-30: qty 2, 28d supply, fill #3
  Filled 2023-10-28 – 2023-11-13 (×2): qty 2, 28d supply, fill #4
  Filled 2023-12-30: qty 2, 28d supply, fill #5
  Filled 2024-01-24: qty 2, 28d supply, fill #6

## 2023-07-08 NOTE — Progress Notes (Signed)
 I'm showing a paid claim-copay $0

## 2023-07-09 ENCOUNTER — Encounter (HOSPITAL_COMMUNITY): Payer: Self-pay | Admitting: Gastroenterology

## 2023-07-09 NOTE — Progress Notes (Signed)
 Attempted to obtain medical history for pre op call via telephone, unable to reach at this time. HIPAA compliant voicemail message left requesting return call to pre surgical testing department.

## 2023-07-10 ENCOUNTER — Ambulatory Visit (HOSPITAL_COMMUNITY): Admitting: Anesthesiology

## 2023-07-10 ENCOUNTER — Encounter (HOSPITAL_COMMUNITY)
Admission: RE | Disposition: A | Discharge status: Home / Self Care | Payer: Self-pay | Source: Ambulatory Visit | Attending: Gastroenterology

## 2023-07-10 ENCOUNTER — Other Ambulatory Visit: Payer: Self-pay

## 2023-07-10 ENCOUNTER — Ambulatory Visit (HOSPITAL_COMMUNITY)
Admission: RE | Admit: 2023-07-10 | Discharge: 2023-07-10 | Disposition: A | Discharge status: Home / Self Care | Source: Ambulatory Visit | Attending: Gastroenterology | Admitting: Gastroenterology

## 2023-07-10 ENCOUNTER — Encounter (HOSPITAL_COMMUNITY): Payer: Self-pay | Admitting: Gastroenterology

## 2023-07-10 ENCOUNTER — Ambulatory Visit (HOSPITAL_COMMUNITY)

## 2023-07-10 DIAGNOSIS — E785 Hyperlipidemia, unspecified: Secondary | ICD-10-CM | POA: Diagnosis not present

## 2023-07-10 DIAGNOSIS — K219 Gastro-esophageal reflux disease without esophagitis: Secondary | ICD-10-CM | POA: Diagnosis not present

## 2023-07-10 DIAGNOSIS — I10 Essential (primary) hypertension: Secondary | ICD-10-CM

## 2023-07-10 DIAGNOSIS — K8050 Calculus of bile duct without cholangitis or cholecystitis without obstruction: Secondary | ICD-10-CM

## 2023-07-10 DIAGNOSIS — K5100 Ulcerative (chronic) pancolitis without complications: Secondary | ICD-10-CM | POA: Insufficient documentation

## 2023-07-10 DIAGNOSIS — R932 Abnormal findings on diagnostic imaging of liver and biliary tract: Secondary | ICD-10-CM | POA: Diagnosis not present

## 2023-07-10 DIAGNOSIS — K838 Other specified diseases of biliary tract: Secondary | ICD-10-CM | POA: Diagnosis not present

## 2023-07-10 DIAGNOSIS — I2510 Atherosclerotic heart disease of native coronary artery without angina pectoris: Secondary | ICD-10-CM

## 2023-07-10 HISTORY — PX: ERCP: SHX5425

## 2023-07-10 SURGERY — ERCP, WITH INTERVENTION IF INDICATED
Anesthesia: General

## 2023-07-10 MED ORDER — CIPROFLOXACIN IN D5W 400 MG/200ML IV SOLN
INTRAVENOUS | Status: AC
  Filled 2023-07-10: qty 200

## 2023-07-10 MED ORDER — DICLOFENAC SUPPOSITORY 100 MG
RECTAL | Status: AC
  Filled 2023-07-10: qty 1

## 2023-07-10 MED ORDER — LIDOCAINE 2% (20 MG/ML) 5 ML SYRINGE
INTRAMUSCULAR | Status: DC | PRN
Start: 2023-07-10 — End: 2023-07-10
  Administered 2023-07-10: 60 mg via INTRAVENOUS

## 2023-07-10 MED ORDER — SUGAMMADEX SODIUM 200 MG/2ML IV SOLN
INTRAVENOUS | Status: DC | PRN
  Administered 2023-07-10: 200 mg via INTRAVENOUS

## 2023-07-10 MED ORDER — LIDOCAINE HCL (CARDIAC) PF 100 MG/5ML IV SOSY: PREFILLED_SYRINGE | INTRAVENOUS | Status: DC | PRN

## 2023-07-10 MED ORDER — PROPOFOL 10 MG/ML IV BOLUS
INTRAVENOUS | Status: DC | PRN
  Administered 2023-07-10: 150 mg via INTRAVENOUS

## 2023-07-10 MED ORDER — PROPOFOL 10 MG/ML IV BOLUS
INTRAVENOUS | Status: AC
  Filled 2023-07-10: qty 20

## 2023-07-10 MED ORDER — GLUCAGON HCL RDNA (DIAGNOSTIC) 1 MG IJ SOLR
INTRAMUSCULAR | Status: AC
  Filled 2023-07-10: qty 1

## 2023-07-10 MED ORDER — DICLOFENAC SUPPOSITORY 100 MG
RECTAL | Status: DC | PRN
  Administered 2023-07-10: 100 mg via RECTAL

## 2023-07-10 MED ORDER — DEXAMETHASONE SODIUM PHOSPHATE 10 MG/ML IJ SOLN
INTRAMUSCULAR | Status: DC | PRN
  Administered 2023-07-10: 10 mg via INTRAVENOUS

## 2023-07-10 MED ORDER — SODIUM CHLORIDE 0.9 % IV SOLN: INTRAVENOUS | Status: DC

## 2023-07-10 MED ORDER — ONDANSETRON HCL 4 MG/2ML IJ SOLN
INTRAMUSCULAR | Status: DC | PRN
  Administered 2023-07-10: 4 mg via INTRAVENOUS

## 2023-07-10 MED ORDER — FENTANYL CITRATE (PF) 100 MCG/2ML IJ SOLN
INTRAMUSCULAR | Status: DC | PRN
  Administered 2023-07-10 (×2): 50 ug via INTRAVENOUS

## 2023-07-10 MED ORDER — SODIUM CHLORIDE 0.9 % IV SOLN
INTRAVENOUS | Status: DC | PRN
  Administered 2023-07-10: 35 mL

## 2023-07-10 MED ORDER — LACTATED RINGERS IV SOLN
INTRAVENOUS | Status: AC | PRN
  Administered 2023-07-10: 1000 mL via INTRAVENOUS

## 2023-07-10 MED ORDER — FENTANYL CITRATE (PF) 100 MCG/2ML IJ SOLN
INTRAMUSCULAR | Status: AC
  Filled 2023-07-10: qty 2

## 2023-07-10 MED ORDER — CIPROFLOXACIN IN D5W 400 MG/200ML IV SOLN
INTRAVENOUS | Status: DC | PRN
Start: 2023-07-10 — End: 2023-07-10
  Administered 2023-07-10: 400 mg via INTRAVENOUS

## 2023-07-10 MED ORDER — LACTATED RINGERS IV SOLN: INTRAVENOUS | Status: DC | PRN

## 2023-07-10 MED ORDER — ROCURONIUM BROMIDE 100 MG/10ML IV SOLN
INTRAVENOUS | Status: DC | PRN
  Administered 2023-07-10: 50 mg via INTRAVENOUS

## 2023-07-10 NOTE — Anesthesia Postprocedure Evaluation (Signed)
 Anesthesia Post Note  Patient: Donna [name]  Procedure(s) Performed: ERCP, WITH INTERVENTION IF INDICATED     Patient location during evaluation: PACU Anesthesia Type: General Level of consciousness: awake and alert Pain management: pain level controlled Vital Signs Assessment: post-procedure vital signs reviewed and stable Respiratory status: spontaneous breathing, nonlabored ventilation and respiratory function stable Cardiovascular status: blood pressure returned to baseline and stable Postop Assessment: no apparent nausea or vomiting Anesthetic complications: no   No notable events documented.  Last Vitals:  Vitals:   07/10/23 1000 07/10/23 1005  BP: (!) 132/54 (!) 132/54  Pulse: 63 (!) 59  Resp: 18 12  Temp:    SpO2: 97% 97%    Last Pain:  Vitals:   07/10/23 0955  TempSrc:   PainSc: 0-No pain                 Butler Levander Pinal

## 2023-07-10 NOTE — Anesthesia Procedure Notes (Signed)
 Procedure Name: Intubation Date/Time: 07/10/2023 8:53 AM  Performed by: Deeann Eva BROCKS, CRNAPre-anesthesia Checklist: Patient identified, Emergency Drugs available, Suction available and Patient being monitored Patient Re-evaluated:Patient Re-evaluated prior to induction Oxygen Delivery Method: Circle System Utilized Preoxygenation: Pre-oxygenation with 100% oxygen Induction Type: IV induction Ventilation: Mask ventilation without difficulty Laryngoscope Size: Mac and 3 Grade View: Grade II Tube type: Oral Tube size: 7.0 mm Number of attempts: 1 Airway Equipment and Method: Stylet and Oral airway Placement Confirmation: ETT inserted through vocal cords under direct vision, positive ETCO2 and breath sounds checked- equal and bilateral Secured at: 20 cm Tube secured with: Tape Dental Injury: Teeth and Oropharynx as per pre-operative assessment

## 2023-07-10 NOTE — Progress Notes (Signed)
 Specialty Pharmacy Refill Coordination Note  Donna [name] is a 78 y.o. female contacted today regarding refills of specialty medication(s) Evolocumab  (Repatha  SureClick)   Patient requested Delivery   Delivery date: 07/12/23   Verified address: [address]  [place]   Medication will be filled on 07/11/23.

## 2023-07-10 NOTE — Anesthesia Preprocedure Evaluation (Signed)
 Anesthesia Evaluation  Patient identified by MRN, date of birth, ID band Patient awake    Reviewed: Allergy & Precautions, NPO status , Patient's Chart, lab work & pertinent test results  Airway Mallampati: II  TM Distance: >3 FB Neck ROM: Full    Dental no notable dental hx.    Pulmonary neg pulmonary ROS   Pulmonary exam normal        Cardiovascular hypertension, Pt. on medications and Pt. on home beta blockers + CAD   Rhythm:Regular Rate:Normal     Neuro/Psych negative neurological ROS  negative psych ROS   GI/Hepatic Neg liver ROS, PUD,GERD  Medicated,,UC   Endo/Other  negative endocrine ROS    Renal/GU negative Renal ROS  negative genitourinary   Musculoskeletal  (+) Arthritis , Osteoarthritis,    Abdominal  (+) + obese  Peds  Hematology negative hematology ROS (+)   Anesthesia Other Findings   Reproductive/Obstetrics                              Anesthesia Physical Anesthesia Plan  ASA: 3  Anesthesia Plan: General   Post-op Pain Management:    Induction: Intravenous  PONV Risk Score and Plan: 3 and Treatment may vary due to age or medical condition, Ondansetron , Dexamethasone  and Midazolam   Airway Management Planned: Oral ETT  Additional Equipment: None  Intra-op Plan:   Post-operative Plan: Extubation in OR  Informed Consent: I have reviewed the patients History and Physical, chart, labs and discussed the procedure including the risks, benefits and alternatives for the proposed anesthesia with the patient or authorized representative who has indicated his/her understanding and acceptance.     Dental advisory given  Plan Discussed with: CRNA  Anesthesia Plan Comments:         Anesthesia Quick Evaluation

## 2023-07-10 NOTE — Progress Notes (Signed)
 Donna [name] 8:05 AM  Subjective: Patient seen and examined and her hospital computer chart and our office computer chart reviewed and she has had some chronic right upper quadrant pain and was seen by my partner Dr. Kriss in the office and had gallstones and CBD stones on ultrasound and mildly elevated liver tests and she was referred for ERCP and she does not yet have a surgical appointment and her colon cancer ostomy was her only abdominal surgery and she is on a baby aspirin  a day but no other blood thinners and has no other complaints and we discussed her previous endoscopies and colonoscopies as well as today's procedure  Objective: Vital signs stable afebrile in good spirits no acute distress exam please see preassessment valuation  Assessment: CBD stones on ultrasound  Plan: Risk benefits methods and success rate of ERCP was discussed with the patient and we will proceed today with anesthesia assistance  [facility]  office [phone] After 5PM or if no answer call [phone]

## 2023-07-10 NOTE — Op Note (Signed)
 [facility] Patient Name: Donna [name] Procedure Date: 07/10/2023 MRN: [id number] Attending MD: Oliva Boots , MD, [id number] Date of Birth: 11-28-1945 CSN: [id number] Age: 78 Admit Type: Outpatient Procedure:                ERCP Indications:              For therapy of bile duct stone(s) Providers:                Oliva Boots, MD, Hoy Penner, RN, Curtistine Bishop, Technician Referring MD:              Medicines:                General Anesthesia Complications:            No immediate complications. Estimated Blood Loss:     Estimated blood loss was minimal. Procedure:                Pre-Anesthesia Assessment:                           - Prior to the procedure, a History and Physical                            was performed, and patient medications and                            allergies were reviewed. The patient's tolerance of                            previous anesthesia was also reviewed. The risks                            and benefits of the procedure and the sedation                            options and risks were discussed with the patient.                            All questions were answered, and informed consent                            was obtained. Prior Anticoagulants: The patient has                            taken no anticoagulant or antiplatelet agents                            except for aspirin . ASA Grade Assessment: II - A                            patient with mild systemic disease. After reviewing  the risks and benefits, the patient was deemed in                            satisfactory condition to undergo the procedure.                           After obtaining informed consent, the scope was                            passed under direct vision. Throughout the                            procedure, the patient's blood pressure, pulse, and                            oxygen  saturations were monitored continuously. The                            [id number]-[id number] ([id number] Olympus duodenoscope was                            introduced through the mouth, and used to inject                            contrast into and used to cannulate the bile duct.                            The ERCP was somewhat difficult due to abnormal                            anatomy. Passing the scope and finding the ampulla                            was the most difficult part and did require lifting                            her right shoulder to successfully pass the scope                            and successful completion of the procedure was                            aided by performing the maneuvers documented                            (below) in this report. The patient tolerated the                            procedure well. Scope In: Scope Out: Findings:      The major papilla was normal albeit somewhat small and flat. Deep       selective cannulation was readily obtained and no obvious filling defect       was seen on initial cholangiogram although  the duct was slightly dilated       and we proceeded with a biliary sphincterotomy which was made with a       Hydratome sphincterotome using ERBE electrocautery. There was no       post-sphincterotomy bleeding at this time however there was some oozing       when we withdrew the balloon later in the procedure. We proceeded with       our sphincterotomy until we had adequate biliary drainage and could get       the fully bowed sphincterotome easily in and out of the duct and to       discover objects, the biliary tree was swept with a 12 mm balloon       starting at the bifurcation multiple times. Nothing was found and       nothing was seen to pass. However on occlusion cholangiogram she did not       seem to drain very well so we proceeded with dilation and the lower       third of the main bile duct was successfully dilated with a  4 cm by 8 mm       balloon dilator. There was adequate biliary drainage at this point and       no further bleeding and the wire and the balloon were removed and the       scope was removed and there was no pancreatic duct injection throughout       the procedure and the patient tolerated the procedure well Impression:               - The major papilla appeared normal.                           - A biliary sphincterotomy was performed.                           - The biliary tree was swept and nothing was found.                           - The lower third of the main bile duct was                            successfully dilated. Moderate Sedation:      Not Applicable - Patient had care per Anesthesia. Recommendation:           - Clear liquid diet for 6 hours. If doing well at 3                            PM may have soft solids                           - Continue present medications.                           - No aspirin , ibuprofen , naproxen, or other                            non-steroidal anti-inflammatory drugs for 2 days.                           -  Return to GI clinic PRN.                           - Telephone GI clinic if symptomatic PRN.                           - Refer to a surgeon at appointment to be scheduled. Procedure Code(s):        --- Professional ---                           [phone], Endoscopic retrograde                            cholangiopancreatography (ERCP); with                            trans-endoscopic balloon dilation of                            biliary/pancreatic duct(s) or of ampulla                            (sphincteroplasty), including sphincterotomy, when                            performed, each duct Diagnosis Code(s):        --- Professional ---                           K80.50, Calculus of bile duct without cholangitis                            or cholecystitis without obstruction CPT copyright 2022 American [employer] Association. All rights  reserved. The codes documented in this report are preliminary and upon coder review may  be revised to meet current compliance requirements. Oliva Boots, MD 07/10/2023 9:51:12 AM This report has been signed electronically. Number of Addenda: 0

## 2023-07-10 NOTE — Discharge Instructions (Addendum)
 YOU HAD AN ENDOSCOPIC PROCEDURE TODAY: Refer to the procedure report and other information in the discharge instructions given to you for any specific questions about what was found during the examination. If this information does not answer your questions, please call [facility] office at [phone] to clarify.   YOU SHOULD EXPECT: Some feelings of bloating in the abdomen. Passage of more gas than usual. Walking can help get rid of the air that was put into your GI tract during the procedure and reduce the bloating. If you had a lower endoscopy (such as a colonoscopy or flexible sigmoidoscopy) you may notice spotting of blood in your stool or on the toilet paper. Some abdominal soreness may be present for a day or two, also.  DIET: Your first meal following the procedure should be a light meal and then it is ok to progress to your normal diet. A half-sandwich or bowl of soup is an example of a good first meal. Heavy or fried foods are harder to digest and may make you feel nauseous or bloated. Drink plenty of fluids but you should avoid alcoholic beverages for 24 hours. If you had a esophageal dilation, please see attached instructions for diet.    ACTIVITY: Your care partner should take you home directly after the procedure. You should plan to take it easy, moving slowly for the rest of the day. You can resume normal activity the day after the procedure however YOU SHOULD NOT DRIVE, use power tools, machinery or perform tasks that involve climbing or major physical exertion for 24 hours (because of the sedation medicines used during the test).   SYMPTOMS TO REPORT IMMEDIATELY: A gastroenterologist can be reached at any hour. Please call [phone]  for any of the following symptoms:  Following lower endoscopy (colonoscopy, flexible sigmoidoscopy) Excessive amounts of blood in the stool  Significant tenderness, worsening of abdominal pains  Swelling of the abdomen that is new, acute  Fever of 100  or higher  Following upper endoscopy (EGD, EUS, ERCP, esophageal dilation) Vomiting of blood or coffee ground material  New, significant abdominal pain  New, significant chest pain or pain under the shoulder blades  Painful or persistently difficult swallowing  New shortness of breath  Black, tarry-looking or red, bloody stools  FOLLOW UP:  If any biopsies were taken you will be contacted by phone or by letter within the next 1-3 weeks. Call [phone]  if you have not heard about the biopsies in 3 weeks.  Please also call with any specific questions about appointments or follow up tests. Hold aspirin  for 2 days call if GI question or problem otherwise we will set up a surgical appointment to discuss taking your gallbladder out and begin with clear liquids only until 3 PM and if doing well may have soft solids slowly advance diet tomorrow

## 2023-07-10 NOTE — Transfer of Care (Signed)
 Immediate Anesthesia Transfer of Care Note  Patient: Donna [name]  Procedure(s) Performed: ERCP, WITH INTERVENTION IF INDICATED  Patient Location: PACU  Anesthesia Type:General  Level of Consciousness: awake and alert   Airway & Oxygen Therapy: Patient Spontanous Breathing and Patient connected to face mask oxygen  Post-op Assessment: Report given to RN and Post -op Vital signs reviewed and stable  Post vital signs: Reviewed and stable  Last Vitals:  Vitals Value Taken Time  BP 147/80 07/10/23 09:40  Temp    Pulse 66 07/10/23 09:43  Resp 21 07/10/23 09:43  SpO2 100 % 07/10/23 09:43  Vitals shown include unfiled device data.  Last Pain:  Vitals:   07/10/23 0756  TempSrc: Temporal  PainSc: 0-No pain         Complications: No notable events documented.

## 2023-07-11 ENCOUNTER — Other Ambulatory Visit: Payer: Self-pay

## 2023-07-11 NOTE — Progress Notes (Signed)
 @Patient  ID: Donna [name], female    DOB: [birth year], 78 y.o.   MRN: [id number]  No chief complaint on file.   Referring provider: Clarice Nottingham, MD  HPI: 78 year old female, never smoked.  Past medical history significant for primary hypertension, elevated coronary artery calcium score, GERD, carpal tunnel syndrome, osteoarthritis, hyperlipidemia, status post colectomy.  Previous [facility] pulmonary encounter:  04/05/2023 Discussed the use of AI scribe software for clinical note transcription with the patient, who gave verbal consent to proceed.  History of Present Illness   Donna [name] is a 78 year old female with obstructive sleep apnea who presents for a sleep consult. She was referred  for management of her CPAP therapy.  She has been using a CPAP machine for approximately two to three months, following a sleep study conducted on September 24, 2022, which revealed 12.9 apneic events per hour, with severe apnea during REM sleep (REM AHI of 36 events per hour) and a lowest oxygen saturation of 86%. The CPAP machine was prescribed by Dr. Clarice, and she has been using it consistently, wearing it six out of seven nights per week, with an average usage of 4.3 hours per night. The pressure setting on her CPAP is 9.3, with an average air leak of 10 liters per minute, and her current AHI is 0.7, indicating effective control of her apneic events.  She experiences discomfort with the nasal mask, which 'hurts my nose', and she is unsure if she is a mouth breather. She has not received any supplies for her CPAP machine and is not currently enrolled in a monitoring program like [employer].  The CPAP wakes her up after about four hours of use, and she has difficulty returning to sleep, often staying awake until 7 or 8 AM. She has been prescribed a sleeping pill by Dr. Andree, which she takes occasionally when she is 'really restless'. The medication helps her stay asleep, but she is cautious about  using it regularly to avoid dependency. She feels tired during the day, attributing it to her inadequate sleep.  In addition to her sleep apnea, she mentions having a 'calcium buildup' in her arteries, suggesting a history of coronary artery disease or atherosclerosis, although she is unsure of the specific diagnosis.  She also has a colostomy, which requires her to wake up multiple times at night, contributing to her sleep disturbances.     Airview download 03/05/2023 - 04/03/2023 Usage days 23/30 days (77%); 21 days (70%) Average usage days used 4 hours 27 minutes Pressure 6 to 11 cm H2O (9.3 cm H2O-95%) Air leaks 9.2 L/min (95%) AHI 0.7   Assessment and Plan    Obstructive Sleep Apnea Sleep study on September 24, 2022 revealed mild OSA with 12.9 apneic events per hour. She had severe apnea during REM sleep (REM AHI of 36 events per hour) and a lowest oxygen saturation of 86%. Started on CPAP 6 months ago by PCP. Patient is 70% compliant with CPAP use over the last 30 days > 4 hours. CPAP pressure 6-11cm h20 with improved AHI to 0.7/hour, indicating effective apnea control. Nasal mask discomfort noted, affecting usage duration. CPAP crucial for cardiovascular health. - Enroll CPAP machine in [employer] program for monitoring. - Consider full face mask if nasal discomfort persists. - Encourage consistent CPAP use nightly 4-6 hours.  Insomnia Patient reports difficulty maintaining sleep. Sleeping pill effective but patient has concerns about dependency and does not take regularly despite insomnia symptoms.  - Advise  nightly sleeping pill for two weeks, then taper to as needed or alternate nights. - Ensure CPAP use shortly after taking sleeping pill.  Colostomy Contributes to nocturnal awakenings.  Follow-up Necessary for management of sleep apnea and insomnia. - Schedule follow-up in 1-2 months to reassess CPAP efficacy and sleep quality.    07/12/2023- Interim hx Discussed the use of AI  scribe software for clinical note transcription with the patient, who gave verbal consent to proceed.  History of Present Illness   Donna [name] is a 78 year old female who presents for a sleep consult. Sleep study on September 24, 2022 revealed mild OSA with 12.9 apneic events per hour. She had severe apnea during REM sleep (REM AHI of 36 events per hour) and a lowest oxygen saturation of 86%. Started on CPAP 6 months ago by PCP.   She uses her CPAP machine 80% of the time, averaging 4 hours and 45 minutes per night. The pressure setting is between 6 and 11, and her apnea score is 0.8 with no significant air leaks. Last month, the machine cut off, requiring her to contact the company for assistance. It is currently functioning well and is approximately five to six months old.  She has not been receiving supplies for her CPAP machine. She uses a nasal mask and has not had the cushions, frame, tubing, headgear, water  chamber, or filters replaced as recommended. She is unsure of the medical supply company but believes it might be [employer].  Her sleep is disrupted due to a colostomy, which requires her to wake up multiple times at night. She takes medication prescribed for insomnia, which she takes most nights depending on how she feels during the day.     [employer]view download 04/12/23-07/10/23 Usage days 72/90 (80%); 71 days (79%) > 4 hours Average usage days used 4 hour 45 mins Pressure 6-11cm h20 Airleaks 17.8L/min AHI 0.8   Allergies  Allergen Reactions   Iodinated Contrast Media Other (See Comments)    Feels faint  coughing, bp elevated   Mesalamine Other (See Comments)   Ozempic (0.25 Or 0.5 Mg-Dose) [Semaglutide(0.25 Or 0.5mg -Dos)] Nausea And Vomiting   Prilosec [Omeprazole ]     Made knees hurt and made me sick   Statins Hives and Other (See Comments)    Muscle spasms     Immunization History  Administered Date(s) Administered   Influenza-Unspecified 11/13/2014, 10/01/2017     Past Medical History:  Diagnosis Date   Arthritis    Bulging lumbar disc    Cancer (HCC)    colon   Degeneration of lumbar or lumbosacral intervertebral disc 01/21/2015   Essential hypertension 01/21/2015   GERD (gastroesophageal reflux disease)    GERD (gastroesophageal reflux disease) 01/21/2015   Heart murmur    Hyperlipidemia    Hyperlipidemia 01/21/2015   Hypertension    Pre-diabetes    diet   Ulcerative colitis (HCC)     Tobacco History: Social History   Tobacco Use  Smoking Status Never  Smokeless Tobacco Never   Counseling given: Not Answered   Outpatient Medications Prior to Visit  Medication Sig Dispense Refill   acetaminophen  (TYLENOL ) 500 MG tablet Take 1,000 mg by mouth every 6 (six) hours as needed for mild pain or headache.     amLODipine  (NORVASC ) 10 MG tablet Take 10 mg by mouth daily.      aspirin  EC 81 MG tablet Take 1 tablet (81 mg total) by mouth daily. Swallow whole. 90 tablet 3   B  Complex Vitamins (B COMPLEX 100 PO) Take 1 tablet by mouth daily.     benazepril  (LOTENSIN ) 40 MG tablet Take 40 mg by mouth at bedtime.     calcium carbonate (OSCAL) 1500 (600 Ca) MG TABS tablet Take 600 mg of elemental calcium by mouth 2 (two) times daily with a meal.     Evolocumab  (REPATHA  SURECLICK) 140 MG/ML SOAJ Inject 140 mg (1 pen) into the skin every 14 days 6 mL 3   hydrochlorothiazide  (HYDRODIURIL ) 25 MG tablet Take 25 mg by mouth daily.     labetalol  (NORMODYNE ) 100 MG tablet TAKE 1 TABLET TWICE DAILY (NEED TO KEEP UPCOMING APPOINTMENT IN APRIL TO PREVENT ANY MISSED DOSES) 180 tablet 3   latanoprost  (XALATAN ) 0.005 % ophthalmic solution Place 1 drop into both eyes at bedtime.     loperamide  (IMODIUM ) 2 MG capsule Take 1 capsule (2 mg total) by mouth 2 (two) times daily. (Patient taking differently: Take 4 mg by mouth in the morning and at bedtime.) 60 capsule 1   Menthol-Methyl Salicylate (MUSCLE RUB) 10-15 % CREA Apply 1 application topically 2 (two) times  daily as needed for muscle pain.     Multiple Vitamin (MULTIVITAMIN WITH MINERALS) TABS tablet Take 1 tablet by mouth daily. Woman's one a day     pantoprazole  (PROTONIX ) 40 MG tablet Take 40 mg by mouth 2 (two) times daily before a meal.      psyllium (HYDROCIL/METAMUCIL) 95 % PACK Take 1 packet by mouth daily. (Patient taking differently: Take 1 packet by mouth 2 (two) times daily.) 30 packet 1   timolol (TIMOPTIC) 0.5 % ophthalmic solution Place 1 drop into both eyes every morning.     traMADol  (ULTRAM ) 50 MG tablet Take 50 mg by mouth every 6 (six) hours as needed for severe pain.     No facility-administered medications prior to visit.   Review of Systems  Review of Systems  Constitutional: Negative.  Negative for fatigue.  HENT: Negative.    Respiratory: Negative.    Cardiovascular: Negative.     Physical Exam  There were no vitals taken for this visit. Physical Exam Constitutional:      Appearance: Normal appearance.  HENT:     Head: Normocephalic and atraumatic.  Cardiovascular:     Rate and Rhythm: Normal rate and regular rhythm.  Pulmonary:     Effort: Pulmonary effort is normal.     Breath sounds: Normal breath sounds.  Musculoskeletal:        General: Normal range of motion.  Skin:    General: Skin is warm and dry.  Neurological:     General: No focal deficit present.     Mental Status: She is alert and oriented to person, place, and time. Mental status is at baseline.  Psychiatric:        Mood and Affect: Mood normal.        Behavior: Behavior normal.        Thought Content: Thought content normal.        Judgment: Judgment normal.      Lab Results:  CBC    Component Value Date/Time   WBC 3.9 04/30/2022 0820   WBC 7.0 06/01/2019 0419   RBC 3.74 (L) 04/30/2022 0820   RBC 3.33 (L) 06/01/2019 0419   HGB 11.0 (L) 04/30/2022 0820   HCT 34.7 04/30/2022 0820   PLT 251 04/30/2022 0820   MCV 93 04/30/2022 0820   MCH 29.4 04/30/2022 0820   MCH 31.2  06/01/2019 0419   MCHC 31.7 04/30/2022 0820   MCHC 32.6 06/01/2019 0419   RDW 13.8 04/30/2022 0820   LYMPHSABS 0.9 05/21/2019 1144   MONOABS 0.5 05/21/2019 1144   EOSABS 0.2 05/21/2019 1144   BASOSABS 0.0 05/21/2019 1144    BMET    Component Value Date/Time   NA 144 04/30/2022 0820   K 4.3 04/30/2022 0820   CL 107 (H) 04/30/2022 0820   CO2 22 04/30/2022 0820   GLUCOSE 90 04/30/2022 0820   GLUCOSE 112 (H) 06/04/2019 0413   BUN 24 04/30/2022 0820   CREATININE 1.36 (H) 04/30/2022 0820   CALCIUM 9.8 04/30/2022 0820   GFRNONAA 58 (L) 06/04/2019 0413   GFRAA >60 06/04/2019 0413    BNP No results found for: BNP  ProBNP No results found for: PROBNP  Imaging: No results found.   Assessment & Plan:   1. Sleep apnea, unspecified type (Primary) - AMB REFERRAL FOR DME  Assessment and Plan    Obstructive Sleep Apnea Obstructive sleep apnea is managed with CPAP therapy. She uses the CPAP machine 80% of the time, averaging 4 hours and 45 minutes per night. The pressure setting is between 6-11cm h20, and the apnea score is 0.8 with no significant air leaks. The machine is functioning well after a recent issue was resolved. She uses a nasal mask and has adapted well to it. - Advised patient continue to use CPAP nightly 4-6 hours or longer  - Order CPAP supplies from [employer], including nasal mask cushions every 2-4 weeks, mask frame every 3 months, tubing every 3 months, headgear and water  chamber every 6 months, and filters  Insomnia Insomnia is managed with Trazodone prescribed by her medical doctor. She takes the medication consistently, which has improved her sleep, although she sometimes skips doses depending on her daytime condition.  Colostomy status She has a colostomy, which requires her to wake up multiple times at night, impacting her sleep quality.   Almarie LELON Ferrari, NP 07/11/2023

## 2023-07-12 ENCOUNTER — Ambulatory Visit (INDEPENDENT_AMBULATORY_CARE_PROVIDER_SITE_OTHER): Admitting: Primary Care

## 2023-07-12 VITALS — BP 136/66 | HR 68 | Temp 98.7°F | Ht 63.0 in | Wt 180.8 lb

## 2023-07-12 DIAGNOSIS — G4730 Sleep apnea, unspecified: Secondary | ICD-10-CM | POA: Diagnosis not present

## 2023-07-12 NOTE — Patient Instructions (Signed)
  VISIT SUMMARY: Today, you had a consultation to discuss your sleep issues, including the use of your CPAP machine, insomnia, and the impact of your colostomy on your sleep quality.  YOUR PLAN: -OBSTRUCTIVE SLEEP APNEA: Obstructive sleep apnea is a condition where your airway becomes blocked during sleep, causing breathing pauses. You are using your CPAP machine 80% of the time, averaging 4 hours and 45 minutes per night, with a pressure setting between 6 and 11. Your apnea score is 0.8, indicating good control, and there are no significant air leaks. The machine is functioning well after a recent issue was resolved. You will be enrolled in [employer] for data monitoring, and you should order CPAP supplies from [employer], including nasal mask cushions every 2-4 weeks, mask frame every 3 months, tubing every 3 months, headgear and water  chamber every 6 months, and filters.  -INSOMNIA: Insomnia is a condition where you have trouble falling or staying asleep. You are managing this with medication prescribed by your doctor, which you take most nights depending on how you feel during the day. This has helped improve your sleep.  -COLOSTOMY STATUS: A colostomy is a surgical procedure where an opening is made in your abdomen to divert waste. This requires you to wake up multiple times at night, which impacts your sleep quality.  INSTRUCTIONS: Please follow up with ordering your CPAP supplies from [employer] as discussed. Enroll in [employer] for data monitoring of your CPAP usage. Continue taking your insomnia medication as prescribed. If you have any issues or concerns, please contact our office.

## 2023-07-25 DIAGNOSIS — Z932 Ileostomy status: Secondary | ICD-10-CM | POA: Diagnosis not present

## 2023-07-25 DIAGNOSIS — K5190 Ulcerative colitis, unspecified, without complications: Secondary | ICD-10-CM | POA: Diagnosis not present

## 2023-07-26 ENCOUNTER — Other Ambulatory Visit: Payer: Self-pay | Admitting: Surgery

## 2023-07-26 DIAGNOSIS — R1011 Right upper quadrant pain: Secondary | ICD-10-CM

## 2023-07-26 DIAGNOSIS — R109 Unspecified abdominal pain: Secondary | ICD-10-CM | POA: Diagnosis not present

## 2023-07-29 ENCOUNTER — Other Ambulatory Visit (HOSPITAL_COMMUNITY): Payer: Self-pay | Admitting: Surgery

## 2023-07-29 DIAGNOSIS — R1011 Right upper quadrant pain: Secondary | ICD-10-CM

## 2023-08-01 ENCOUNTER — Other Ambulatory Visit: Payer: Self-pay

## 2023-08-01 NOTE — Progress Notes (Signed)
 Specialty Pharmacy Refill Coordination Note  Kani Jobson Dpwija is a 78 y.o. female contacted today regarding refills of specialty medication(s) Evolocumab  (Repatha  SureClick)   Patient requested Delivery   Delivery date: 08/06/23   Verified address: [address]  [place]   Medication will be filled on 08/05/23.

## 2023-08-19 ENCOUNTER — Encounter (HOSPITAL_COMMUNITY): Payer: Self-pay | Admitting: Surgery

## 2023-08-22 ENCOUNTER — Ambulatory Visit (HOSPITAL_COMMUNITY)
Admission: RE | Admit: 2023-08-22 | Discharge: 2023-08-22 | Disposition: A | Discharge status: Home / Self Care | Source: Ambulatory Visit | Attending: Surgery | Admitting: Surgery

## 2023-08-22 DIAGNOSIS — K429 Umbilical hernia without obstruction or gangrene: Secondary | ICD-10-CM | POA: Diagnosis not present

## 2023-08-22 DIAGNOSIS — R1011 Right upper quadrant pain: Secondary | ICD-10-CM | POA: Diagnosis not present

## 2023-08-22 DIAGNOSIS — D3501 Benign neoplasm of right adrenal gland: Secondary | ICD-10-CM | POA: Diagnosis not present

## 2023-08-22 MED ORDER — IOHEXOL 300 MG/ML  SOLN
100.0000 mL | Freq: Once | INTRAMUSCULAR | Status: AC | PRN
  Administered 2023-08-22: 100 mL via INTRAVENOUS

## 2023-08-23 ENCOUNTER — Other Ambulatory Visit: Payer: Self-pay

## 2023-08-23 DIAGNOSIS — K5190 Ulcerative colitis, unspecified, without complications: Secondary | ICD-10-CM | POA: Diagnosis not present

## 2023-08-23 DIAGNOSIS — Z932 Ileostomy status: Secondary | ICD-10-CM | POA: Diagnosis not present

## 2023-08-27 ENCOUNTER — Other Ambulatory Visit (HOSPITAL_COMMUNITY): Payer: Self-pay

## 2023-08-29 ENCOUNTER — Encounter (HOSPITAL_COMMUNITY): Payer: Self-pay | Admitting: Surgery

## 2023-08-29 ENCOUNTER — Encounter (HOSPITAL_COMMUNITY)
Admission: RE | Disposition: A | Discharge status: Home / Self Care | Payer: Self-pay | Source: Home / Self Care | Attending: Surgery

## 2023-08-29 ENCOUNTER — Ambulatory Visit (HOSPITAL_COMMUNITY)
Admission: RE | Admit: 2023-08-29 | Discharge: 2023-08-29 | Disposition: A | Discharge status: Home / Self Care | Attending: Surgery | Admitting: Surgery

## 2023-08-29 ENCOUNTER — Ambulatory Visit (HOSPITAL_COMMUNITY): Admitting: Certified Registered Nurse Anesthetist

## 2023-08-29 ENCOUNTER — Other Ambulatory Visit: Payer: Self-pay

## 2023-08-29 DIAGNOSIS — Z9049 Acquired absence of other specified parts of digestive tract: Secondary | ICD-10-CM | POA: Diagnosis not present

## 2023-08-29 DIAGNOSIS — K621 Rectal polyp: Secondary | ICD-10-CM | POA: Insufficient documentation

## 2023-08-29 DIAGNOSIS — E785 Hyperlipidemia, unspecified: Secondary | ICD-10-CM | POA: Diagnosis not present

## 2023-08-29 DIAGNOSIS — I10 Essential (primary) hypertension: Secondary | ICD-10-CM | POA: Diagnosis not present

## 2023-08-29 DIAGNOSIS — Z6831 Body mass index (BMI) 31.0-31.9, adult: Secondary | ICD-10-CM | POA: Insufficient documentation

## 2023-08-29 DIAGNOSIS — Z79899 Other long term (current) drug therapy: Secondary | ICD-10-CM | POA: Insufficient documentation

## 2023-08-29 DIAGNOSIS — M549 Dorsalgia, unspecified: Secondary | ICD-10-CM | POA: Diagnosis not present

## 2023-08-29 DIAGNOSIS — E669 Obesity, unspecified: Secondary | ICD-10-CM | POA: Diagnosis not present

## 2023-08-29 DIAGNOSIS — Z4659 Encounter for fitting and adjustment of other gastrointestinal appliance and device: Secondary | ICD-10-CM | POA: Diagnosis not present

## 2023-08-29 DIAGNOSIS — Z860100 Personal history of colon polyps, unspecified: Secondary | ICD-10-CM | POA: Insufficient documentation

## 2023-08-29 DIAGNOSIS — Z8379 Family history of other diseases of the digestive system: Secondary | ICD-10-CM | POA: Diagnosis not present

## 2023-08-29 DIAGNOSIS — K5190 Ulcerative colitis, unspecified, without complications: Secondary | ICD-10-CM | POA: Insufficient documentation

## 2023-08-29 DIAGNOSIS — Z85038 Personal history of other malignant neoplasm of large intestine: Secondary | ICD-10-CM | POA: Diagnosis not present

## 2023-08-29 DIAGNOSIS — K51818 Other ulcerative colitis with other complication: Secondary | ICD-10-CM

## 2023-08-29 DIAGNOSIS — K279 Peptic ulcer, site unspecified, unspecified as acute or chronic, without hemorrhage or perforation: Secondary | ICD-10-CM | POA: Diagnosis not present

## 2023-08-29 DIAGNOSIS — K219 Gastro-esophageal reflux disease without esophagitis: Secondary | ICD-10-CM | POA: Insufficient documentation

## 2023-08-29 DIAGNOSIS — G8929 Other chronic pain: Secondary | ICD-10-CM | POA: Insufficient documentation

## 2023-08-29 DIAGNOSIS — D126 Benign neoplasm of colon, unspecified: Secondary | ICD-10-CM | POA: Diagnosis not present

## 2023-08-29 HISTORY — PX: FLEXIBLE SIGMOIDOSCOPY: SHX5431

## 2023-08-29 SURGERY — SIGMOIDOSCOPY, FLEXIBLE
Anesthesia: Monitor Anesthesia Care | Laterality: Left

## 2023-08-29 MED ORDER — PROPOFOL 1000 MG/100ML IV EMUL
INTRAVENOUS | Status: AC
  Filled 2023-08-29: qty 200

## 2023-08-29 MED ORDER — SODIUM CHLORIDE 0.9 % IV SOLN: INTRAVENOUS | Status: DC

## 2023-08-29 MED ORDER — LIDOCAINE 2% (20 MG/ML) 5 ML SYRINGE
INTRAMUSCULAR | Status: DC | PRN
  Administered 2023-08-29: 80 mg via INTRAVENOUS

## 2023-08-29 MED ORDER — PROPOFOL 500 MG/50ML IV EMUL
INTRAVENOUS | Status: DC | PRN
  Administered 2023-08-29: 30 mg via INTRAVENOUS
  Administered 2023-08-29 (×2): 20 mg via INTRAVENOUS
  Administered 2023-08-29: 200 ug/kg/min via INTRAVENOUS

## 2023-08-29 MED ORDER — PROPOFOL 500 MG/50ML IV EMUL
INTRAVENOUS | Status: AC
  Filled 2023-08-29: qty 50

## 2023-08-29 NOTE — H&P (Signed)
 CC: Here today for flexible sigmoidoscopy  HPI: Donna [name] is an 78 y.o. female with hx of HTN, HLD, GERD, osteoarthritis, in long-standing history of ulcerative colitis-53 year history dating back to when she was 78 years old. She denies ever having had a take steroids for this. She has been well controlled from the disease standpoint with sulfasalazine . She denies any history of infusional agents. She has undergone routine surveillance colonoscopies near annually for the last 30 years. She follows with Dr. Donnald currently. She does have chronic back pain. She denies any blood in her stool. She underwent another one of her routine surveillance colonoscopies 12/21/18 which demonstrated diffuse area of mildly erythematous mucosa in the sigmoid and descending and transverse. 6 mm sigmoid polyp found that was flat and removed with the cold snare. Area around the polyp was also biopsied separately. Area was tattooed with spot proximally and distally. 2 flat polyps in the rectum there were 4 mm that were removed. Anal stricture was also found. Pathology returned:  Inactive chronic colitis negative for dysplasia in the proximal colon-no granuloma or inclusions. 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 2. Mildly active chronic colitis negative for dysplasia in the mid colon 3. Low and high-grade dysplasia with focal area suspicious for invasion and the polyp that was removed from the sigmoid. DALM not excluded. 4. Tissue from around the polypectomy site returned with tubulovillous adenoma and low-grade dysplasia 5. Rectosigmoid biopsy returned mildly active chronic colitis negative for dysplasia.  Her pathology was sent to [facility] for a second pathologist review the findings. By report from Dr. Gideon office, concordance with our pathologist was maintained and this lesion is felt to represent a flat area of cancer involving the polyp with surrounding dysplasia  that is indistinct. This is not amenable to further polypectomy as per Dr.Buccini. She has been referred for consideration of proctocolectomy.  She has 3 children, one of whom was delivered vaginally. She denies any significant birth trauma seceding with this. She denies any history of incontinence to gas, liquid, or solid stool. Currently, she states she has had diarrhea most of her adult life 3-5 bowel movements per day. This is almost always liquid.  OR 05/29/19 - robotic total proctocolectomy with end ileostomy, lysis of adhesions, primary repair of incisional hernia. She recovered well postoperatively and was discharged home 06/04/19. Path - invasive adenocarcinoma, well-differentiated-pTisN0 (0/17 LNs). She has had some bag leaked issues but has home health in place and they're working to get her with a elastic band to assist with holding the bag in place given her habitus.  Flex sig 06/29/22 Distal rectal pouch characterized by healthy-appearing mucosa and an intact staple line. Anal transition zone biopsied.  PATH Benign squamous epithelium and squamous mucosa with focal acute  inflammation and erosion  Negative for colonic epithelium and mucosa  Negative for dysplasia and granulomas   Has been doing well. No complaints. No abdominal pain, or rectal bleeding. No anorectal tissue prolapse. Still taking Imodium , 2 tablets 3 times daily as well as Metamucil. With this regimen, the consistency is toothpaste of her ileostomy. Denies any nausea or vomiting. Good appetite. Denies abdominal pain.  INTERVAL HX  EGD 05/04/2022: - Gastritis negative for H. pylori  Completed on ultrasound 06/12/2023: - Mildly dilated common bile duct which appears to contain tiny stones. - Gallstone without evidence of cholecystitis - Increased echogenicity of right kidney  ERCP with Dr. Rosalie 07/10/2023: - Major papilla normal - Sphincterotomy performed -  Biliary tree was swept and nothing was found - Lower third  of the main bile duct was successfully dilated  LFTs were obtained through [facility] 06/2023 and were notable for normal liver enzymes, total bilirubin 0.5, albumin 4.2  She reports she has fairly persistent symptoms over the last few months of right sided abdominal discomfort. It does not seem to come or go with food intake or food avoidance. She will occasionally have some left-sided pains. She points to the area around her stoma is more of the issue where she experiences pain more than the true right upper quadrant. No fever or chills. Her ostomy is working well with toothpaste consistency output. She continues to take Imodium  as noted at previous visits to prevent watery effluent.   Past Medical History:  Diagnosis Date   Arthritis    Bulging lumbar disc    Cancer (HCC)    colon   Degeneration of lumbar or lumbosacral intervertebral disc 01/21/2015   Essential hypertension 01/21/2015   GERD (gastroesophageal reflux disease)    GERD (gastroesophageal reflux disease) 01/21/2015   Heart murmur    Hyperlipidemia    Hyperlipidemia 01/21/2015   Hypertension    Pre-diabetes    diet   Ulcerative colitis [facility])     Past Surgical History:  Procedure Laterality Date   BIOPSY  06/29/2022   Procedure: BIOPSY;  Surgeon: Teresa Lonni HERO, MD;  Location: [facility];  Service: General;;   CESAREAN SECTION     COLON SURGERY     ERCP N/A 07/10/2023   Procedure: ERCP, WITH INTERVENTION IF INDICATED;  Surgeon: Rosalie Kitchens, MD;  Location: [facility];  Service: Gastroenterology;  Laterality: N/A;   FLEXIBLE SIGMOIDOSCOPY N/A 05/18/2020   Procedure: SURVEILLANCE FLEXIBLE SIGMOIDOSCOPY;  Surgeon: Teresa Lonni HERO, MD;  Location: [facility];  Service: General;  Laterality: N/A;   FLEXIBLE SIGMOIDOSCOPY N/A 06/29/2022   Procedure: FLEXIBLE SIGMOIDOSCOPY;  Surgeon: Teresa Lonni HERO, MD;  Location: [facility];  Service: General;  Laterality: N/A;   INCISIONAL HERNIA REPAIR N/A 05/29/2019    Procedure: PRIMARY INCISIONAL HERNIA REPAIR;  Surgeon: Teresa Lonni HERO, MD;  Location: [facility];  Service: General;  Laterality: N/A;   LAPAROTOMY Right 01/12/2015   Procedure: OPEN RIGHT COLECTOMY;  Surgeon: Donnice Bury, MD;  Location: [facility];  Service: General;  Laterality: Right;   RIGHT COLECTOMY  01/12/2015   TOTAL KNEE ARTHROPLASTY Left 06/20/2018   Procedure: TOTAL KNEE ARTHROPLASTY;  Surgeon: Yvone Rush, MD;  Location: [facility];  Service: Orthopedics;  Laterality: Left;    Family History  Problem Relation Age of Onset   Diabetes Mother    Ulcerative colitis Father    Heart failure Sister    Sleep apnea Son     Social:  reports that she has never smoked. She has never used smokeless tobacco. She reports that she does not drink alcohol and does not use drugs.  Allergies:  Allergies  Allergen Reactions   Iodinated Contrast Media Other (See Comments)    Feels faint  coughing, bp elevated   Mesalamine Other (See Comments)   Ozempic (0.25 Or 0.5 Mg-Dose) [Semaglutide(0.25 Or 0.5mg -Dos)] Nausea And Vomiting   Prilosec [Omeprazole ]     Made knees hurt and made me sick   Statins Hives and Other (See Comments)    Muscle spasms     Medications: I have reviewed the patient's current medications.  No results found for this or any previous visit (from the past 48 hours).  No results found.  ROS - all of the below systems have been reviewed with the patient and positives are indicated with bold text General: chills, fever or night sweats Eyes: blurry vision or double vision ENT: epistaxis or sore throat Allergy/Immunology: itchy/watery eyes or nasal congestion Hematologic/Lymphatic: bleeding problems, blood clots or swollen lymph nodes Endocrine: temperature intolerance or unexpected weight changes Breast: new or changing breast lumps or nipple discharge Resp: cough, shortness of breath, or wheezing CV: chest pain or dyspnea on exertion GI: as per HPI GU: dysuria,  trouble voiding, or hematuria MSK: joint pain or joint stiffness Neuro: TIA or stroke symptoms Derm: pruritus and skin lesion changes Psych: anxiety and depression  PE Blood pressure (!) 145/62, pulse 67, temperature 97.6 F (36.4 C), temperature source Temporal, resp. rate 20, height 5' 3 (1.6 m), weight 81.6 kg, SpO2 99%. Constitutional: NAD; conversant; no deformities Eyes: Moist conjunctiva; no lid lag; anicteric; PERRL Neck: Trachea midline; no thyromegaly Lungs: Normal respiratory effort; no tactile fremitus CV: RRR; no palpable thrills; no pitting edema GI: Abd soft, NT/ND; no palpable hepatosplenomegaly MSK: Normal range of motion of extremities; no clubbing/cyanosis Psychiatric: Appropriate affect; alert and oriented x3 Lymphatic: No palpable cervical or axillary lymphadenopathy  No results found for this or any previous visit (from the past 48 hours).  No results found.    A/P: Gwendloyn Forsee Dpwija is an 78 y.o. female with is a very pleasant 75yoF with long standing UC, HTN, HLD, GERD, back/left knee pain/arthritis - here for evaluation for total proctocolectomy in the setting of her most recent endoscopic exam 12/21/18 demonstrating a flat sigmoid polyp with at least carcinoma in situ if not an invasive component and surrounding adenomatous tissue (DALM) not amenable to further endoscopic resection due to indistinct margins.  -OR 05/29/19 - robotic total proctocolectomy with end ileostomy. Pathology returned pTisN0 (0/17)M0.  -She continues to do quite well -We again discussed diet as tolerated, working to avoid concentrated sweets - sugar and salt. Her ostomy output is well controlled with a good consistency output. We discussed continuing a fiber supplement daily or twice daily likely indefinitely and continuing her regimen with Imodium  at this time.  -Appliance belt working well previously; may benefit from Convex appliance - saw [facility] team but did not feel the Convex  appliance was any better. Prefers 2 piece flat.  Will plan to schedule flexible sigmoidoscopy for surveillance purposes given her history of UC + cancer, we have recommended scheduling and discussed importance (last 06/2022) - going today for this. We re-reviewed the procedure, material risks (including but not limited to pain, bleeding, infection, perforation of viscus, need for additional procedures, pneumonia/aspiration, heart attack, stroke, death)  - CT Abdomen/pelvis for further evaluation of abdominal discomfort -ultrasound done as an outpatient did demonstrate gallstones which were small. Her symptoms however do not seem to be consistent with biliary colic/symptomatic cholelithiasis and appear more likely to be related to a potential parastomal hernia. CT will certainly help elicit cause.    Lonni Pizza, MD [facility], [facility]

## 2023-08-29 NOTE — Op Note (Signed)
 [facility] Patient Name: Donna [name] Procedure Date: 08/29/2023 MRN: [id number] Attending MD: Lonni CHRISTELLA Pizza MD, MD,  Date of Birth: 1945/09/28 CSN: [id number] Age: 78 Admit Type: Outpatient Procedure:                Flexible Sigmoidoscopy Indications:              High risk colon cancer surveillance: Ulcerative                            colitis with known dysplasia (previous visible                            dysplastic lesion removed completely) Providers:                Lonni CHRISTELLA. Quintella Mura MD, MD, Willy Hummer, RN,                            Felice Sar, Technician Referring MD:              Medicines:                Monitored Anesthesia Care Complications:            No immediate complications. Estimated blood loss:                            Minimal. Estimated Blood Loss:     Estimated blood loss was minimal. Estimated blood                            loss was minimal. Procedure:                Pre-Anesthesia Assessment:                           - Prior to the procedure, a History and Physical                            was performed, and patient medications, allergies                            and sensitivities were reviewed. The patient's                            tolerance of previous anesthesia was reviewed.                           - The risks and benefits of the procedure and the                            sedation options and risks were discussed with the                            patient. All questions were answered and informed                            consent was obtained.                           -  The risks and benefits of the procedure and the                            sedation options and risks were discussed with the                            patient. All questions were answered and informed                            consent was obtained.                           After obtaining informed consent, the scope was                             passed under direct vision. The [id number]-[id number] ([id number])                            Olympus endoscope was introduced through the anus                            and advanced to the the rectum. Scope In: 7:36:59 AM Scope Out: 7:40:29 AM Total Procedure Duration: 0 hours 3 minutes 30 seconds  Findings:      The perianal and digital examinations were normal with the exception of       expected dis-use stenosis of anal canal, accomodates digital exam after       steady digital dilation. Pertinent negatives include no anal lesion or       palpable distal rectal abnormality.      A patchy area of granular mucosa was found in the distal rectum.       Biopsies were taken with a cold forceps for histology. Estimated blood       loss: none. Verification of patient identification for the specimen was       done by the physician, nurse and technician using the patient's name,       birth date and medical record number.      The exam was otherwise without abnormality. Impression:               - No specimens collected. Moderate Sedation:      Not Applicable - Patient had care per Anesthesia. Recommendation:           - Continue present medications.                           - Resume previous diet indefinitely. Procedure Code(s):        --- Professional ---                           [phone], 52, Sigmoidoscopy, flexible; with biopsy,                            single or multiple Diagnosis Code(s):        --- Professional ---  K51.90, Ulcerative colitis, unspecified, without                            complications                           D12.6, Benign neoplasm of colon, unspecified CPT copyright 2022 American Medical Association. All rights reserved. The codes documented in this report are preliminary and upon coder review may  be revised to meet current compliance requirements. Lonni Pizza, MD Lonni CHRISTELLA Pizza MD, MD 08/29/2023 7:51:37 AM This report has  been signed electronically. Number of Addenda: 0

## 2023-08-29 NOTE — Anesthesia Preprocedure Evaluation (Signed)
 Anesthesia Evaluation  Patient identified by MRN, date of birth, ID band Patient awake    Reviewed: Allergy & Precautions, NPO status , Patient's Chart, lab work & pertinent test results  Airway Mallampati: II       Dental no notable dental hx.    Pulmonary    Pulmonary exam normal        Cardiovascular hypertension, Pt. on home beta blockers and Pt. on medications Normal cardiovascular exam     Neuro/Psych    GI/Hepatic PUD,GERD  Medicated and Controlled,,Ulcerative colitis   Endo/Other    Renal/GU      Musculoskeletal negative musculoskeletal ROS (+)    Abdominal  (+) + obese  Peds  Hematology   Anesthesia Other Findings HISTORY OF RECTAL CANCER  Reproductive/Obstetrics                              Anesthesia Physical Anesthesia Plan  ASA: 2  Anesthesia Plan: MAC   Post-op Pain Management:    Induction:   PONV Risk Score and Plan: 2 and Propofol  infusion and Treatment may vary due to age or medical condition  Airway Management Planned: Simple Face Mask  Additional Equipment:   Intra-op Plan:   Post-operative Plan:   Informed Consent: I have reviewed the patients History and Physical, chart, labs and discussed the procedure including the risks, benefits and alternatives for the proposed anesthesia with the patient or authorized representative who has indicated his/her understanding and acceptance.     Dental advisory given  Plan Discussed with: CRNA  Anesthesia Plan Comments:          Anesthesia Quick Evaluation

## 2023-08-29 NOTE — Discharge Instructions (Addendum)
   DIET: Resume previous  Some minor bleeding from the rectal area is to be expected for the first 1-3 days. This may be controlled with a pad.  Take your usually prescribed home medications unless otherwise directed.  PAIN CONTROL: If you have any discomfort in the perianal area, you may take tylenol  1000 mg every 6-8 hrs as needed  ACTIVITIES as tolerated  FOLLOW UP in our office Please call [facility] at [phone] to set up an appointment to see your surgeon in the office for a follow-up appointment approximately 2 weeks after your surgery. Make sure that you call for this appointment the day you arrive home to insure a convenient appointment time.  9. If you have disability or family leave forms that need to be completed, you may have them completed by your primary care physician's office; for return to work instructions, please ask our office staff and they will be happy to assist you in obtaining this documentation   When to call us  [phone]: Poor pain control Reactions / problems with new medications (rash/itching, etc)  Fever over 101.5 F (38.5 C) Inability to urinate Nausea/vomiting Worsening swelling or bruising Continued bleeding from incision. Increased pain, redness, or drainage from the incision  The clinic staff is available to answer your questions during regular business hours (8:30am-5pm).  Please don't hesitate to call and ask to speak to one of our nurses for clinical concerns.   A surgeon from [facility] is always on call at the hospitals   If you have a medical emergency, go to the nearest emergency room or call 911.   [facility] [address], [place], [place]  [address] MAIN: [phone] FAX: [phone] [email or web address]

## 2023-08-29 NOTE — Anesthesia Procedure Notes (Signed)
 Procedure Name: MAC Date/Time: 08/29/2023 7:27 AM  Performed by: Franchot Delon RAMAN, CRNAPre-anesthesia Checklist: Patient identified, Emergency Drugs available, Suction available and Patient being monitored Oxygen Delivery Method: Simple face mask Placement Confirmation: positive ETCO2 Dental Injury: Teeth and Oropharynx as per pre-operative assessment

## 2023-08-29 NOTE — Transfer of Care (Addendum)
 Immediate Anesthesia Transfer of Care Note  Patient: Christin Moline Bannan  Procedure(s) Performed: SIGMOIDOSCOPY, FLEXIBLE (Left)  Patient Location: PACU Endo  Anesthesia Type:MAC  Level of Consciousness: drowsy and responds to stimulation  Airway & Oxygen Therapy: Patient Spontanous Breathing and Patient connected to face mask oxygen  Post-op Assessment: Report given to RN and Post -op Vital signs reviewed and stable  Post vital signs: Reviewed and stable  Last Vitals:  Vitals Value Taken Time  BP 98/39 (57) 0747  Temp 36.2 C 08/29/23 07:47  Pulse 60 08/29/23 07:47  Resp 15 08/29/23 07:47  SpO2 98 % 08/29/23 07:47    Last Pain:  Vitals:   08/29/23 0747  TempSrc:   PainSc: Asleep         Complications: No notable events documented.

## 2023-08-30 ENCOUNTER — Encounter (HOSPITAL_COMMUNITY): Payer: Self-pay | Admitting: Surgery

## 2023-08-30 ENCOUNTER — Other Ambulatory Visit: Payer: Self-pay

## 2023-08-30 LAB — SURGICAL PATHOLOGY

## 2023-08-30 NOTE — Anesthesia Postprocedure Evaluation (Signed)
 Anesthesia Post Note  Patient: Donna [name]  Procedure(s) Performed: SIGMOIDOSCOPY, FLEXIBLE (Left)     Patient location during evaluation: Endoscopy Anesthesia Type: MAC Level of consciousness: awake Pain management: pain level controlled Vital Signs Assessment: post-procedure vital signs reviewed and stable Respiratory status: spontaneous breathing, nonlabored ventilation and respiratory function stable Cardiovascular status: blood pressure returned to baseline and stable Postop Assessment: no apparent nausea or vomiting Anesthetic complications: no   No notable events documented.  Last Vitals:  Vitals:   08/29/23 0749 08/29/23 0800  BP: (!) 109/43 136/65  Pulse: 61 64  Resp: 17 17  Temp:    SpO2: 96% 98%    Last Pain:  Vitals:   08/29/23 0800  TempSrc:   PainSc: 0-No pain                 Gayland Nicol P Keyshla Tunison

## 2023-08-31 ENCOUNTER — Other Ambulatory Visit (HOSPITAL_COMMUNITY): Payer: Self-pay

## 2023-08-31 ENCOUNTER — Encounter (INDEPENDENT_AMBULATORY_CARE_PROVIDER_SITE_OTHER): Payer: Self-pay

## 2023-09-03 ENCOUNTER — Other Ambulatory Visit: Payer: Self-pay

## 2023-09-03 NOTE — Progress Notes (Signed)
 Specialty Pharmacy Refill Coordination Note  Donna [name] is a 78 y.o. female contacted today regarding refills of specialty medication(s) Evolocumab  (Repatha  SureClick)   Patient requested Delivery   Delivery date: 09/04/23   Verified address: [address] [place]. [id number]   Medication will be filled on 09/03/23.

## 2023-09-06 DIAGNOSIS — Z23 Encounter for immunization: Secondary | ICD-10-CM | POA: Diagnosis not present

## 2023-09-17 DIAGNOSIS — R1314 Dysphagia, pharyngoesophageal phase: Secondary | ICD-10-CM | POA: Diagnosis not present

## 2023-09-17 DIAGNOSIS — K51018 Ulcerative (chronic) pancolitis with other complication: Secondary | ICD-10-CM | POA: Diagnosis not present

## 2023-09-23 ENCOUNTER — Other Ambulatory Visit (HOSPITAL_COMMUNITY): Payer: Self-pay

## 2023-09-24 ENCOUNTER — Ambulatory Visit: Payer: Self-pay | Admitting: Surgery

## 2023-09-24 DIAGNOSIS — K5190 Ulcerative colitis, unspecified, without complications: Secondary | ICD-10-CM | POA: Diagnosis present

## 2023-09-24 DIAGNOSIS — Z932 Ileostomy status: Secondary | ICD-10-CM | POA: Insufficient documentation

## 2023-09-24 DIAGNOSIS — K51818 Other ulcerative colitis with other complication: Secondary | ICD-10-CM | POA: Diagnosis not present

## 2023-09-24 DIAGNOSIS — K9419 Other complications of enterostomy: Principal | ICD-10-CM | POA: Diagnosis present

## 2023-09-24 DIAGNOSIS — K435 Parastomal hernia without obstruction or  gangrene: Secondary | ICD-10-CM | POA: Diagnosis not present

## 2023-09-24 DIAGNOSIS — K430 Incisional hernia with obstruction, without gangrene: Secondary | ICD-10-CM | POA: Diagnosis not present

## 2023-09-24 NOTE — H&P (View-Only) (Signed)
 PATIENT NAME: Donna [name] MRN: [id number] DOB: 1945/02/21 PHYSICIANS:  REFERRING PHYSICIAN:  Self  CARE TEAM:   Patient Care Team: Clarice Ryan BIRCH, MD as PCP - General (Internal Medicine) Sheldon, Elspeth Bitter, MD as Consulting Provider (General Surgery) Teresa Lonni Sharper, MD as Consulting Provider (Colon and Rectal Surgery) Elmira Penman, MD (Cardiothoracic Vascular Surgery)  CONSULTING PROVIDER:  ELSPETH BITTER SHELDON, MD     SUBJECTIVE   Chief Complaint: New Problem  ( parastomal hernia-saw CW 9/9/)    Donna [name] is a 78 y.o. female  who is seen today as an office consultation  at the request of DrRONITA Teresa for evaluation of parastomal and incisional recurrent hernias  History of Present Illness:  Pleasant elderly woman.  History of ulcerative colitis.  Followed by Dr. Donnald with [facility].  Usually just took sulfasalazine .  No heavy steroid nor anti-TNF agents.  On colonoscopy she had areas of low and high-grade dysplasia with concern of invasive polyp in the sigmoid colon.  Chronic coliti.  She ended up requiring abdominal proctocolectomy with end ileostomy given her advanced age.  In situ cancer noted within the final specimen.  She has recovered from that relatively well.  She is no longer on steroids or any immunosuppressive regimen.  She has had prior abdominal surgeries and had an incisional hernia that was probably repaired at the time of surgery.  On follow-up with her colorectal surgeon, my partner Dr. Teresa, was noted she had recurrent incisional hernia as well as some swelling around her ileostomy.  She feels like its gotten larger and more sensitive and uncomfortable.  Based on concerns of recurrent midline hernia as well as more painful parastomal hernia around her ileostomy, consultation requested to consider parastomal hernia repair.  Patient comes in by herself.  She usually moves her bowels about 4 or 5 times a day.  Use Imodium  about  3 times a day.  She is back to most moderate daily activities.  She just had knee surgery so she cannot walk more than 10 or 15 minutes but usually can do 20-30 minutes.  She had a pretty extensive cardiac workup prior to her abdominal colectomy 2 years ago that was underwhelming.  She saw her cardiologist who felt she was doing rather well.  She does not smoke.  She does have sleep apnea but uses a CPAP machine.  She did have a colon volvulus requiring a right colectomy in open fashion 2017 by Dr. Ebbie.  Got through that relatively well.     Medical History:  Past Medical History:  Diagnosis Date  . Arthritis     Patient Active Problem List  Diagnosis  . Recurrent incisional hernia with incarceration  . Para-ileostomy hernia  (CMS/HHS-HCC)  . Status post ileostomy (CMS/HHS-HCC)  . Ulcerative colitis (CMS/HHS-HCC)    Past Surgical History:  Procedure Laterality Date  . colon removal    . KNEE ARTHROSCOPY       Allergies  Allergen Reactions  . Statins-Hmg-Coa Reductase Inhibitors Rash  . Iodinated Contrast Media Other (See Comments)    Faint- sick on stomach  . Mesalamine Other (See Comments)  . Omeprazole  Unknown    Made knees hurt and made me sick    Current Outpatient Medications on File Prior to Visit  Medication Sig Dispense Refill  . amLODIPine  (NORVASC ) 10 MG tablet     . benazepriL  (LOTENSIN ) 40 MG tablet     . calcium carbonate 600 mg calcium (1,500  mg) Tab tablet Take by mouth    . carvediloL  (COREG ) 3.125 MG tablet Take by mouth    . famotidine (PEPCID) 20 MG tablet Take 20 mg by mouth once daily    . hydroCHLOROthiazide  (HYDRODIURIL ) 25 MG tablet Take 25 mg by mouth once daily    . labetaloL  (TRANDATE ) 100 MG tablet TAKE 1 TABLET TWICE DAILY (NEED TO KEEP UPCOMING APPOINTMENT IN APRIL TO PREVENT ANY MISSED DOSES)    . latanoprost  (XALATAN ) 0.005 % ophthalmic solution Apply 1 drop to eye at bedtime    . loperamide  (IMODIUM ) 2 mg capsule     . multivitamin  with minerals tablet Take by mouth    . pantoprazole  (PROTONIX ) 40 MG DR tablet Take by mouth    . predniSONE  (DELTASONE ) 50 MG tablet Take one (1) tablet at 13 hours, 7 hours, and 1 hour prior to IV contrast. 3 tablet 0  . REPATHA  SURECLICK 140 mg/mL PnIj Inject 140 mg (1 pen) into the skin every 14 days    . timoloL maleate (TIMOPTIC) 0.5 % ophthalmic solution INSTILL 1 DROP INTO EACH EYE IN THE MORNING    . traMADoL  (ULTRAM ) 50 mg tablet Take by mouth    . traZODone (DESYREL) 50 MG tablet Take 50 mg by mouth at bedtime     No current facility-administered medications on file prior to visit.    Family History  Problem Relation Age of Onset  . Hypercoagulable state (Tendency to form frequent blood clots) Mother   . Hyperlipidemia (Elevated cholesterol) Mother   . High blood pressure (Hypertension) Mother   . Obesity Mother   . Hypercoagulable state (Tendency to form frequent blood clots) Sister   . Hyperlipidemia (Elevated cholesterol) Sister   . High blood pressure (Hypertension) Sister   . Obesity Sister      Social History   Tobacco Use  Smoking Status Never  Smokeless Tobacco Never     Social History   Socioeconomic History  . Marital status: Divorced  Tobacco Use  . Smoking status: Never  . Smokeless tobacco: Never  Substance and Sexual Activity  . Alcohol use: Never  . Drug use: Never  . Sexual activity: Defer   Social Drivers of Health   Food Insecurity: No Food Insecurity (11/03/2021)   [facility]   Hunger Vital Sign   . Within the past 12 months, you worried that your food would run out before you got the money to buy more.: Never true   . Within the past 12 months, the food you bought just didn't last and you didn't have money to get more.: Never true  Transportation Needs: No Transportation Needs (08/02/2021)   Received from [facility] - Transportation   . Lack of Transportation (Medical): No   . Lack of Transportation  (Non-Medical): No  Housing Stability: Unknown (05/06/2023)   Housing Stability Vital Sign   . Homeless in the Last Year: No    ############################################################  Review of Systems: A complete review of systems (ROS) was obtained from the patient.   We have reviewed this information and discussed as appropriate with the patient.   See HPI as well for other pertinent ROS.  Constitutional:  No fevers, chills, sweats.  Weight stable Eyes:  No vision changes, No discharge HENT:  No sore throats, nasal drainage Lymph: No neck swelling, No bruising easily Pulmonary:  No cough, productive sputum CV: No orthopnea, PND . No exertional chest/neck/shoulder/arm pain.  Patient can walk  20 minutes gradually.    GI: IBD as noted.  No personal nor family history of GI/colon cancer, irritable bowel syndrome, allergy such as Celiac Sprue, dietary/dairy problems, colitis, ulcers nor gastritis.  No recent sick contacts/gastroenteritis.  No travel outside the country.  No changes in diet.  Renal: No UTIs, No hematuria Genital:  No drainage, bleeding, masses Musculoskeletal: No severe joint pain.  Good ROM major joints Skin:  No sores or lesions Heme/Lymph:  No easy bleeding.  No swollen lymph nodes Neuro:  No active seizures.  No facial droop Psych:  No hallucinations.  No agitation  OBJECTIVE   Vitals:   09/24/23 0843  BP: (!) 143/70  Pulse: 62  Temp: 36.7 C (98 F)  TempSrc: Temporal  SpO2: 98%  Weight: 81 kg (178 lb 9.6 oz)  Height: 160 cm (5' 3)  PainSc: 0-No pain    Body mass index is 31.64 kg/m.  PHYSICAL EXAM:   Constitutional: Not cachectic.  Hygeine adequate.  Vitals signs as above.   Eyes: Wears glasses - vision corrected,Pupils reactive, normal extraocular movements. Sclera nonicteric Neuro: CN II-XII intact.  No major focal sensory defects.  No major motor deficits. Lymph: No head/neck/groin lymphadenopathy Psych:  No severe agitation.  No severe  anxiety.  Judgment & insight Adequate, Oriented x4, HENT: Normocephalic, Mucus membranes moist.  No thrush.  Hearing: adequate Neck: Supple, No tracheal deviation.  No obvious thyromegaly Chest: No pain to chest wall compression.  Good respiratory excursion.  No audible wheezing CV:  Pulses intact.  regular.  No major extremity edema Ext: No obvious deformity or contracture.  Edema: Not present.  No cyanosis Skin: No major subcutaneous nodules.  Warm and dry Musculoskeletal: Severe joint rigidity not present.  No obvious clubbing.  No digital petechiae.  Mobility: no assist device moving easily without restrictions  Abdomen:  Obese Soft.   Nondistended.  Tenderness and sensitivity in midline with fullness consistent with chronically incarcerated hernia.  Bulging of sensitivity around right sided end ileostomy but eventually reducible consistent with parastomal hernia..    Diastasis recti: Not present.  No hepatomegaly.  No splenomegaly.  Genital/Pelvic:  Inguinal hernia: Not present.  Inguinal lymph nodes: without lymphadenopathy nor hidradenitis.    Rectal: (Deferred)  PE Chaperone note: No sensitive exam performed    ###################################################################  Labs, Imaging and Diagnostic Testing:  Located in 'Care Everywhere' section of Epic EMR chart   PRIOR [facility] CLINIC NOTES:  Located in 'Care Everywhere' section of Epic EMR chart   SURGERY NOTES:  Located in 'Care Everywhere' section of Epic EMR chart   PATHOLOGY:  Located in 'Care Everywhere' section of Epic EMR chart    Assessment and Plan:  DIAGNOSES:  Diagnoses and all orders for this visit:  Para-ileostomy hernia  (CMS/HHS-HCC)  Recurrent incisional hernia with incarceration  Status post ileostomy (CMS/HHS-HCC)  Other ulcerative colitis with other complication (CMS/HHS-HCC)     ASSESSMENT/PLAN  Elderly active woman requiring abdominal proctocolectomy for ulcerative colitis  with early-stage cancer with permanent ileostomy now with parastomal hernia around her ileostomy and recurrent midline incisional hernia incarcerated.  Looks like omentum.  They are getting larger with increased pain and sensitivity.  She wishes to be aggressive and get these repaired.  Internal referral from Dr. Teresa to myself.  I think she could benefit from surgery.  Would plan a minimally invasive approach.  See if I can do a robotic modified e-TEP with possible component separations if needed.  We will see.  Will  plan podophyllin the left side to address the midline and right sided parastomal hernias.  Most likely overnight stay given the larger hernia repair and her advanced age.  She had pretty decent performance status and got through her major colectomy 2 years ago without consequence of underwhelming preoperative cardiac workup 2 years ago and seen earlier this year by her cardiologist doing well. The anatomy & physiology of the abdominal wall was discussed.  The pathophysiology of hernias was discussed.  Natural history risks without surgery including progeressive enlargement, pain, incarceration, & strangulation was discussed.   Contributors to complications such as smoking, obesity, diabetes, prior surgery, etc were discussed.   I feel the risks of no intervention will lead to serious problems that outweigh the operative risks; therefore, I recommended surgery to reduce and repair the hernia.  I explained minimally invasive approaches such as laparoscopy or robotics with possible need for an open approach.  I noted the probable use of mesh to patch and/or buttress the hernia repair  Risks such as bleeding, infection, abscess, need for further treatment, injury to other organs, need for repair of tissues / organs, stroke, heart attack, death, and other risks were discussed.  I noted a good likelihood this will help address the problem.   Goals of post-operative recovery were discussed as well.   Possibility that this will not correct all symptoms was explained.  I stressed the importance of low-impact activity, aggressive pain control, avoiding constipation, & not pushing through pain to minimize risk of post-operative chronic pain or injury. Possibility of reherniation especially with smoking, obesity, diabetes, immunosuppression, and other health conditions was discussed.  We will work to minimize complications.     An educational handout further explaining the pathology & treatment options was given as well.  Questions were answered.  The patient expresses understanding & wishes to proceed with surgery.   FOLLOWUP:  Return for WE RECOMMEND SURGERY - See instructions.  I have reviewed this patient's available data, including medical history, doctor notes, radiology & other studies, events of note, test results, physical exam, etc as part of my evaluation.   A significant portion of that time was spent in counseling in discussion of management, need for further testing, need for other medical or surgical input, etc.   Care was provided by me.  Based on my assessment, this care required MODERATE- Level 4 level of medical decision making.  09/24/2023   The plan was discussed in detail with the patient today, who expressed understanding & appreciation.  The patient has my contact information, and understands to call me with any additional questions or concerns.  I & my group would be happy to see the patient back sooner if the need arises.  Of note, portions of this report may have been transcribed using voice recognition software. Effort was made to ensure accuracy; however, inadvertent computerized transcription errors with sound-a-like substitutions may be present.   Any transcriptional errors that result from this process are unintentional.  ########################################################   Elspeth KYM Schultze, MD, FACS, MASCRS Esophageal, Gastrointestinal & Colorectal  Surgery Robotic and Minimally Invasive Surgery  [facility]  [address] [place], [place] [address] [phone] Fax [phone] Main              09/24/2023

## 2023-09-26 DIAGNOSIS — Z932 Ileostomy status: Secondary | ICD-10-CM | POA: Diagnosis not present

## 2023-09-26 DIAGNOSIS — K5190 Ulcerative colitis, unspecified, without complications: Secondary | ICD-10-CM | POA: Diagnosis not present

## 2023-09-27 ENCOUNTER — Encounter (INDEPENDENT_AMBULATORY_CARE_PROVIDER_SITE_OTHER): Payer: Self-pay

## 2023-09-27 ENCOUNTER — Other Ambulatory Visit: Payer: Self-pay

## 2023-09-30 ENCOUNTER — Other Ambulatory Visit: Payer: Self-pay | Admitting: Pharmacy Technician

## 2023-09-30 ENCOUNTER — Other Ambulatory Visit: Payer: Self-pay

## 2023-09-30 NOTE — Progress Notes (Signed)
 Specialty Pharmacy Refill Coordination Note  Donna [name] is a 78 y.o. female contacted today regarding refills of specialty medication(s) Evolocumab  (Repatha  SureClick)   Patient requested Delivery Delivery date: 10/03/23 Verified address: [address] [place], [place] [address]   Medication will be filled on 10/04/23.   Patient answered Questionnaire.

## 2023-10-02 ENCOUNTER — Other Ambulatory Visit: Payer: Self-pay

## 2023-10-12 NOTE — Patient Instructions (Addendum)
 SURGICAL WAITING ROOM VISITATION Patients having surgery or a procedure may have no more than 2 support people in the waiting area - these visitors may rotate in the visitor waiting room.   If the patient needs to stay at the hospital during part of their recovery, the visitor guidelines for inpatient rooms apply.  PRE-OP VISITATION  Pre-op nurse will coordinate an appropriate time for 1 support person to accompany the patient in pre-op.  This support person may not rotate.  This visitor will be contacted when the time is appropriate for the visitor to come back in the pre-op area.  Please refer to the [facility] website for the visitor guidelines for Inpatients (after your surgery is over and you are in a regular room).  You are not required to quarantine at this time prior to your surgery. However, you must do this: Hand Hygiene often Do NOT share personal items Notify your provider if you are in close contact with someone who has COVID or you develop fever 100.4 or greater, new onset of sneezing, cough, sore throat, shortness of breath or body aches.  If you test positive for Covid or have been in contact with anyone that has tested positive in the last 10 days please notify you surgeon.    Your procedure is scheduled on:  Friday October 18, 2023  Report to [facility] Main Entrance: [address] entrance where the [employer] is available.   Report to admitting at:  08:45 AM  Call this number if you have any questions or problems the morning of surgery [phone]  FOLLOW ANY ADDITIONAL PRE OP INSTRUCTIONS YOU RECEIVED FROM YOUR SURGEON'S OFFICE!!!  DRINK two (2) bottles of Pre-Surgery G2 drink starting at 6:00 pm the evening prior to your surgery to help prevent dehydration. Increase drinking clear fluids (see list below)          Do not eat food after Midnight the night prior to your surgery/procedure.  After Midnight you may have the following liquids until  08:15 AM DAY  OF SURGERY  Clear Liquid Diet Water  Black Coffee (sugar ok, NO MILK/CREAM OR CREAMERS)  Tea (sugar ok, NO MILK/CREAM OR CREAMERS) regular and decaf                             Plain Jell-O  with no fruit (NO RED)                                           Fruit ices (not with fruit pulp, NO RED)                                     Popsicles (NO RED)                                                                  Juice: NO CITRUS JUICES: only apple, WHITE grape, WHITE cranberry Sports drinks like Gatorade or Powerade (NO RED)  The day of surgery:  Drink ONE (1) Pre-Surgery G2 at   08:15  AM the morning of surgery. Drink in one sitting. Do not sip.  This drink was given to you during your hospital pre-op appointment visit. Nothing else to drink after completing the Pre-Surgery G2 : No candy, chewing gum or throat lozenges.     Oral Hygiene is also important to reduce your risk of infection.        Remember - BRUSH YOUR TEETH THE MORNING OF SURGERY WITH YOUR REGULAR TOOTHPASTE  Do NOT smoke after Midnight the night before surgery.  STOP TAKING all Vitamins, Herbs and supplements 1 week before your surgery.   Take ONLY these medicines the morning of surgery with A SIP OF WATER : Pantoprazole , Famotidine, Labetalol , and EITHER Tylenol  OR Tramadol  if needed for pain.    If You have been diagnosed with Sleep Apnea - Bring CPAP mask and tubing day of surgery. We will provide you with a CPAP machine on the day of your surgery.                   You may not have any metal on your body including hair pins, jewelry, and body piercing  Do not wear make-up, lotions, powders, perfumes / cologne, or deodorant  Do not wear nail polish including gel and S&S, artificial / acrylic nails, or any other type of covering on natural nails including finger and toenails. If you have artificial nails, gel coating, etc., that needs to be removed by a nail salon, Please have this removed prior  to surgery. Not doing so may mean that your surgery could be cancelled or delayed if the Surgeon or anesthesia staff feels like they are unable to monitor you safely.   Do not shave 48 hours prior to surgery to avoid nicks in your skin which may contribute to postoperative infections.   Men may shave face and neck.  Contacts, Hearing Aids, dentures or bridgework may not be worn into surgery. DENTURES WILL BE REMOVED PRIOR TO SURGERY PLEASE DO NOT APPLY Poly grip OR ADHESIVES!!!  You may bring a small overnight bag with you on the day of surgery, only pack items that are not valuable. [facility] IS NOT RESPONSIBLE   FOR VALUABLES THAT ARE LOST OR STOLEN.    Do not bring your home medications to the hospital. The Pharmacy will dispense medications listed on your medication list to you during your admission in the Hospital.  Please read over the following fact sheets you were given: IF YOU HAVE QUESTIONS ABOUT YOUR PRE-OP INSTRUCTIONS, PLEASE CALL [phone]   [facility] - Preparing for Surgery        Before surgery, you can play an important role.  Because skin is not sterile, your skin needs to be as free of germs as possible.  You can reduce the number of germs on your skin by washing with CHG (chlorahexidine gluconate) soap before surgery.  CHG is an antiseptic cleaner which kills germs and bonds with the skin to continue killing germs even after washing. Please DO NOT use if you have an allergy to CHG or antibacterial soaps.  If your skin becomes reddened/irritated stop using the CHG and inform your nurse when you arrive at Short Stay. Do not shave (including legs and underarms) for at least 48 hours prior to the first CHG shower.  You may shave your face/neck.  Please follow these instructions carefully:  1.  Shower with CHG Soap  the night before surgery ONLY (DO NOT USE THE CHG SOAP THE MORNING OF SURGERY).  2.  If you choose to wash your hair, wash your hair first as usual with your  normal  shampoo.  3.  After you shampoo, rinse your hair and body thoroughly to remove the shampoo.                             4.  Use CHG as you would any other liquid soap.  You can apply chg directly to the skin and wash.  Gently with a scrungie or clean washcloth.  5.  Apply the CHG Soap to your body ONLY FROM THE NECK DOWN.   Do not use on face/ open                           Wound or open sores. Avoid contact with eyes, ears mouth and genitals (private parts).                       Wash face,  Genitals (private parts) with your normal soap.             6.  Wash thoroughly, paying special attention to the area where your  surgery  will be performed.  7.  Thoroughly rinse your body with warm water  from the neck down.  8.  DO NOT shower/wash with your normal soap after using and rinsing off the CHG Soap.                9.  Pat yourself dry with a clean towel.            10.  Wear clean pajamas.            11.  Place clean sheets on your bed the night of your first shower and do not  sleep with pets.  Day of Surgery : Do not apply any CHG, lotions/deodorants the morning of surgery.  Please wear clean clothes to the hospital/surgery center.   FAILURE TO FOLLOW THESE INSTRUCTIONS MAY RESULT IN THE CANCELLATION OF YOUR SURGERY  PATIENT SIGNATURE_________________________________  NURSE SIGNATURE__________________________________  ________________________________________________________________________

## 2023-10-12 NOTE — Progress Notes (Signed)
 COVID Vaccine received:  []  No [x]  Yes Date of any COVID positive Test in last 90 days:  None  PCP - Ryan Hives, MD [phone] (Work)  [phone] (Fax)  Cardiologist - Newman Lawrence, MD   Chest x-ray - 06-17-2018  2v  Epic EKG - 04-10-2023  Epic   Stress Test - 04-17-2021  Epic ECHO - 05-17-2023  Epic Cardiac Cath -  CT Coronary Calcium score: 866 on 02-22-2021  Epic  Bowel Prep - []  No  []   Yes __G2 x 3_  Pacemaker / ICD device [x]  No []  Yes   Spinal Cord Stimulator:[x]  No []  Yes       History of Sleep Apnea? []  No [x]  Yes   CPAP used?- []  No [x]  Yes    Patient has: [x]  NO Hx DM   []  Pre-DM   []  DM1  []   DM2 Does the patient monitor blood sugar?   []  N/A   [x]  No []  Yes  Last A1c was: 5.0   on   05-21-2019    Blood Thinner / Instructions:  none Aspirin  Instructions:  ASA 81 mg    will hold as of today, patient aware  Dental hx: []  Dentures:  [x]  N/A      []  Bridge or Partial:                   []  Loose or Damaged teeth:   Activity level: Able to walk up 2 flights of stairs without becoming significantly short of breath or having chest pain?  [x]  No  would have SOB  []    Yes  Patient can perform ADLs without assistance. []  No   [x]   Yes  Anesthesia review: HTN, anemia, OSA-CPAP, GERD  Patient denies any S&S of respiratory illness or Covid - no shortness of breath, fever, cough or chest pain at PAT appointment.  Patient verbalized understanding and agreement to the Pre-Surgical Instructions that were given to them at this PAT appointment. Patient was also educated of the need to review these PAT instructions again prior to her surgery.I reviewed the appropriate phone numbers to call if they have any and questions or concerns.

## 2023-10-14 ENCOUNTER — Other Ambulatory Visit: Payer: Self-pay

## 2023-10-14 ENCOUNTER — Encounter (HOSPITAL_COMMUNITY): Payer: Self-pay

## 2023-10-14 ENCOUNTER — Encounter (HOSPITAL_COMMUNITY)
Admission: RE | Admit: 2023-10-14 | Discharge: 2023-10-14 | Disposition: A | Discharge status: Home / Self Care | Source: Ambulatory Visit | Attending: Surgery | Admitting: Surgery

## 2023-10-14 VITALS — BP 136/60 | HR 58 | Temp 98.0°F | Resp 16 | Ht 63.0 in | Wt 174.0 lb

## 2023-10-14 DIAGNOSIS — I10 Essential (primary) hypertension: Secondary | ICD-10-CM | POA: Diagnosis not present

## 2023-10-14 DIAGNOSIS — Z01812 Encounter for preprocedural laboratory examination: Secondary | ICD-10-CM | POA: Insufficient documentation

## 2023-10-14 DIAGNOSIS — D649 Anemia, unspecified: Secondary | ICD-10-CM | POA: Diagnosis not present

## 2023-10-14 DIAGNOSIS — Z01818 Encounter for other preprocedural examination: Secondary | ICD-10-CM

## 2023-10-14 HISTORY — DX: Anemia, unspecified: D64.9

## 2023-10-14 LAB — BASIC METABOLIC PANEL WITH GFR
Anion gap: 11 (ref 5–15)
BUN: 18 mg/dL (ref 8–23)
CO2: 25 mmol/L (ref 22–32)
Calcium: 10 mg/dL (ref 8.9–10.3)
Chloride: 106 mmol/L (ref 98–111)
Creatinine, Ser: 1.72 mg/dL — ABNORMAL HIGH (ref 0.44–1.00)
GFR, Estimated: 30 mL/min — ABNORMAL LOW (ref >60)
Glucose, Bld: 105 mg/dL — ABNORMAL HIGH (ref 70–99)
Potassium: 4.3 mmol/L (ref 3.5–5.1)
Sodium: 141 mmol/L (ref 135–145)

## 2023-10-14 LAB — CBC
HCT: 34.7 % — ABNORMAL LOW (ref 36.0–46.0)
Hemoglobin: 11.1 g/dL — ABNORMAL LOW (ref 12.0–15.0)
MCH: 30.3 pg (ref 26.0–34.0)
MCHC: 32 g/dL (ref 30.0–36.0)
MCV: 94.8 fL (ref 80.0–100.0)
Platelets: 194 K/uL (ref 150–400)
RBC: 3.66 MIL/uL — ABNORMAL LOW (ref 3.87–5.11)
RDW: 14.6 % (ref 11.5–15.5)
WBC: 5.4 K/uL (ref 4.0–10.5)
nRBC: 0 % (ref 0.0–0.2)

## 2023-10-18 ENCOUNTER — Encounter (HOSPITAL_COMMUNITY)
Admission: RE | Disposition: A | Discharge status: Home Health | Payer: Self-pay | Source: Home / Self Care | Attending: Surgery

## 2023-10-18 ENCOUNTER — Inpatient Hospital Stay (HOSPITAL_COMMUNITY): Payer: Self-pay | Admitting: Anesthesiology

## 2023-10-18 ENCOUNTER — Encounter (HOSPITAL_COMMUNITY): Payer: Self-pay | Admitting: Surgery

## 2023-10-18 ENCOUNTER — Other Ambulatory Visit: Payer: Self-pay

## 2023-10-18 ENCOUNTER — Inpatient Hospital Stay (HOSPITAL_COMMUNITY)
Admission: RE | Admit: 2023-10-18 | Discharge: 2023-10-31 | DRG: 330 | Disposition: A | Discharge status: Home / Self Care | Attending: Surgery | Admitting: Surgery

## 2023-10-18 ENCOUNTER — Inpatient Hospital Stay (HOSPITAL_COMMUNITY): Payer: Self-pay | Admitting: Physician Assistant

## 2023-10-18 DIAGNOSIS — K9419 Other complications of enterostomy: Principal | ICD-10-CM | POA: Diagnosis present

## 2023-10-18 DIAGNOSIS — K430 Incisional hernia with obstruction, without gangrene: Secondary | ICD-10-CM

## 2023-10-18 DIAGNOSIS — Z91041 Radiographic dye allergy status: Secondary | ICD-10-CM | POA: Diagnosis not present

## 2023-10-18 DIAGNOSIS — K567 Ileus, unspecified: Secondary | ICD-10-CM | POA: Diagnosis not present

## 2023-10-18 DIAGNOSIS — Z888 Allergy status to other drugs, medicaments and biological substances status: Secondary | ICD-10-CM | POA: Diagnosis not present

## 2023-10-18 DIAGNOSIS — Z7982 Long term (current) use of aspirin: Secondary | ICD-10-CM

## 2023-10-18 DIAGNOSIS — Z79899 Other long term (current) drug therapy: Secondary | ICD-10-CM | POA: Diagnosis not present

## 2023-10-18 DIAGNOSIS — K5190 Ulcerative colitis, unspecified, without complications: Secondary | ICD-10-CM | POA: Diagnosis not present

## 2023-10-18 DIAGNOSIS — K219 Gastro-esophageal reflux disease without esophagitis: Secondary | ICD-10-CM | POA: Diagnosis present

## 2023-10-18 DIAGNOSIS — Z85038 Personal history of other malignant neoplasm of large intestine: Secondary | ICD-10-CM | POA: Diagnosis not present

## 2023-10-18 DIAGNOSIS — I2510 Atherosclerotic heart disease of native coronary artery without angina pectoris: Secondary | ICD-10-CM | POA: Diagnosis not present

## 2023-10-18 DIAGNOSIS — E876 Hypokalemia: Secondary | ICD-10-CM | POA: Diagnosis present

## 2023-10-18 DIAGNOSIS — G4733 Obstructive sleep apnea (adult) (pediatric): Secondary | ICD-10-CM | POA: Diagnosis present

## 2023-10-18 DIAGNOSIS — Z832 Family history of diseases of the blood and blood-forming organs and certain disorders involving the immune mechanism: Secondary | ICD-10-CM

## 2023-10-18 DIAGNOSIS — K6389 Other specified diseases of intestine: Secondary | ICD-10-CM | POA: Diagnosis not present

## 2023-10-18 DIAGNOSIS — K529 Noninfective gastroenteritis and colitis, unspecified: Secondary | ICD-10-CM | POA: Diagnosis not present

## 2023-10-18 DIAGNOSIS — Z932 Ileostomy status: Secondary | ICD-10-CM

## 2023-10-18 DIAGNOSIS — Z7952 Long term (current) use of systemic steroids: Secondary | ICD-10-CM | POA: Diagnosis not present

## 2023-10-18 DIAGNOSIS — E7800 Pure hypercholesterolemia, unspecified: Secondary | ICD-10-CM | POA: Insufficient documentation

## 2023-10-18 DIAGNOSIS — Z8249 Family history of ischemic heart disease and other diseases of the circulatory system: Secondary | ICD-10-CM

## 2023-10-18 DIAGNOSIS — Z8719 Personal history of other diseases of the digestive system: Secondary | ICD-10-CM

## 2023-10-18 DIAGNOSIS — Z9049 Acquired absence of other specified parts of digestive tract: Secondary | ICD-10-CM | POA: Diagnosis not present

## 2023-10-18 DIAGNOSIS — M5430 Sciatica, unspecified side: Secondary | ICD-10-CM | POA: Insufficient documentation

## 2023-10-18 DIAGNOSIS — I129 Hypertensive chronic kidney disease with stage 1 through stage 4 chronic kidney disease, or unspecified chronic kidney disease: Secondary | ICD-10-CM | POA: Diagnosis not present

## 2023-10-18 DIAGNOSIS — M1712 Unilateral primary osteoarthritis, left knee: Secondary | ICD-10-CM | POA: Diagnosis present

## 2023-10-18 DIAGNOSIS — R1319 Other dysphagia: Secondary | ICD-10-CM | POA: Insufficient documentation

## 2023-10-18 DIAGNOSIS — N179 Acute kidney failure, unspecified: Secondary | ICD-10-CM | POA: Diagnosis present

## 2023-10-18 DIAGNOSIS — K660 Peritoneal adhesions (postprocedural) (postinfection): Secondary | ICD-10-CM | POA: Diagnosis present

## 2023-10-18 DIAGNOSIS — N1832 Chronic kidney disease, stage 3b: Secondary | ICD-10-CM | POA: Diagnosis not present

## 2023-10-18 DIAGNOSIS — R7303 Prediabetes: Secondary | ICD-10-CM | POA: Diagnosis present

## 2023-10-18 DIAGNOSIS — R109 Unspecified abdominal pain: Secondary | ICD-10-CM | POA: Diagnosis not present

## 2023-10-18 DIAGNOSIS — Z4682 Encounter for fitting and adjustment of non-vascular catheter: Secondary | ICD-10-CM | POA: Diagnosis not present

## 2023-10-18 DIAGNOSIS — K435 Parastomal hernia without obstruction or  gangrene: Secondary | ICD-10-CM | POA: Diagnosis not present

## 2023-10-18 DIAGNOSIS — E785 Hyperlipidemia, unspecified: Secondary | ICD-10-CM | POA: Diagnosis not present

## 2023-10-18 DIAGNOSIS — K436 Other and unspecified ventral hernia with obstruction, without gangrene: Secondary | ICD-10-CM | POA: Diagnosis not present

## 2023-10-18 DIAGNOSIS — Z96652 Presence of left artificial knee joint: Secondary | ICD-10-CM | POA: Diagnosis present

## 2023-10-18 DIAGNOSIS — K56609 Unspecified intestinal obstruction, unspecified as to partial versus complete obstruction: Secondary | ICD-10-CM

## 2023-10-18 DIAGNOSIS — N281 Cyst of kidney, acquired: Secondary | ICD-10-CM | POA: Diagnosis not present

## 2023-10-18 HISTORY — PX: XI ROBOTIC ASSISTED PARASTOMAL HERNIA REPAIR: SHX6540

## 2023-10-18 HISTORY — PX: XI ROBOTIC ASSISTED VENTRAL HERNIA: SHX6789

## 2023-10-18 SURGERY — REPAIR, HERNIA, PARASTOMAL, ROBOT-ASSISTED
Anesthesia: General | Site: Abdomen

## 2023-10-18 MED ORDER — ACETAMINOPHEN 500 MG PO TABS
1000.0000 mg | ORAL_TABLET | Freq: Four times a day (QID) | ORAL | Status: DC
  Administered 2023-10-18 – 2023-10-26 (×22): 1000 mg via ORAL
  Filled 2023-10-18 (×26): qty 2

## 2023-10-18 MED ORDER — GABAPENTIN 300 MG PO CAPS
300.0000 mg | ORAL_CAPSULE | ORAL | Status: DC
  Filled 2023-10-18: qty 1

## 2023-10-18 MED ORDER — PROCHLORPERAZINE EDISYLATE 10 MG/2ML IJ SOLN
5.0000 mg | Freq: Four times a day (QID) | INTRAMUSCULAR | Status: DC | PRN
  Administered 2023-10-24: 10 mg via INTRAVENOUS
  Administered 2023-10-24: 7.5 mg via INTRAVENOUS
  Filled 2023-10-18 (×2): qty 2

## 2023-10-18 MED ORDER — BISACODYL 10 MG RE SUPP: 10.0000 mg | Freq: Every day | RECTAL | Status: DC | PRN

## 2023-10-18 MED ORDER — ENSURE PRE-SURGERY PO LIQD: 592.0000 mL | Freq: Once | ORAL | Status: DC

## 2023-10-18 MED ORDER — HYDROMORPHONE HCL 1 MG/ML IJ SOLN
0.5000 mg | INTRAMUSCULAR | Status: DC | PRN
  Administered 2023-10-18 – 2023-10-25 (×18): 1 mg via INTRAVENOUS
  Administered 2023-10-26: 0.5 mg via INTRAVENOUS
  Filled 2023-10-18 (×19): qty 1

## 2023-10-18 MED ORDER — FENTANYL CITRATE (PF) 100 MCG/2ML IJ SOLN
INTRAMUSCULAR | Status: DC | PRN
  Administered 2023-10-18 (×2): 25 ug via INTRAVENOUS
  Administered 2023-10-18 (×3): 50 ug via INTRAVENOUS

## 2023-10-18 MED ORDER — PROCHLORPERAZINE MALEATE 10 MG PO TABS: 10.0000 mg | ORAL_TABLET | Freq: Four times a day (QID) | ORAL | Status: DC | PRN

## 2023-10-18 MED ORDER — DIPHENHYDRAMINE HCL 12.5 MG/5ML PO ELIX: 12.5000 mg | ORAL_SOLUTION | Freq: Four times a day (QID) | ORAL | Status: DC | PRN

## 2023-10-18 MED ORDER — ONDANSETRON HCL 4 MG/2ML IJ SOLN
4.0000 mg | Freq: Four times a day (QID) | INTRAMUSCULAR | Status: DC | PRN
  Administered 2023-10-23 – 2023-10-26 (×4): 4 mg via INTRAVENOUS
  Filled 2023-10-18 (×6): qty 2

## 2023-10-18 MED ORDER — BUPIVACAINE LIPOSOME 1.3 % IJ SUSP: 20.0000 mL | Freq: Once | INTRAMUSCULAR | Status: DC

## 2023-10-18 MED ORDER — METOPROLOL TARTRATE 5 MG/5ML IV SOLN: 5.0000 mg | Freq: Four times a day (QID) | INTRAVENOUS | Status: DC | PRN

## 2023-10-18 MED ORDER — OXYCODONE HCL 5 MG/5ML PO SOLN
ORAL | Status: AC
  Filled 2023-10-18: qty 5

## 2023-10-18 MED ORDER — CHLORHEXIDINE GLUCONATE CLOTH 2 % EX PADS: 6.0000 | MEDICATED_PAD | Freq: Once | CUTANEOUS | Status: DC

## 2023-10-18 MED ORDER — SALINE SPRAY 0.65 % NA SOLN: 1.0000 | Freq: Four times a day (QID) | NASAL | Status: DC | PRN

## 2023-10-18 MED ORDER — SUGAMMADEX SODIUM 200 MG/2ML IV SOLN
INTRAVENOUS | Status: AC
  Filled 2023-10-18: qty 2

## 2023-10-18 MED ORDER — SIMETHICONE 80 MG PO CHEW
40.0000 mg | CHEWABLE_TABLET | Freq: Four times a day (QID) | ORAL | Status: DC | PRN
  Administered 2023-10-22 – 2023-10-25 (×6): 40 mg via ORAL
  Filled 2023-10-18 (×8): qty 1

## 2023-10-18 MED ORDER — SODIUM CHLORIDE 0.9% FLUSH
3.0000 mL | Freq: Two times a day (BID) | INTRAVENOUS | Status: DC
Start: 2023-10-18 — End: 2023-10-31
  Administered 2023-10-18 – 2023-10-30 (×22): 3 mL via INTRAVENOUS

## 2023-10-18 MED ORDER — ROCURONIUM BROMIDE 10 MG/ML (PF) SYRINGE
PREFILLED_SYRINGE | INTRAVENOUS | Status: AC
  Filled 2023-10-18: qty 10

## 2023-10-18 MED ORDER — ENOXAPARIN SODIUM 40 MG/0.4ML IJ SOSY
40.0000 mg | PREFILLED_SYRINGE | INTRAMUSCULAR | Status: DC
  Administered 2023-10-19 – 2023-10-20 (×2): 40 mg via SUBCUTANEOUS
  Filled 2023-10-18 (×2): qty 0.4

## 2023-10-18 MED ORDER — PHENOL 1.4 % MT LIQD: 2.0000 | OROMUCOSAL | Status: DC | PRN

## 2023-10-18 MED ORDER — KCL IN DEXTROSE-NACL 20-5-0.45 MEQ/L-%-% IV SOLN
INTRAVENOUS | Status: AC
  Filled 2023-10-18: qty 1000

## 2023-10-18 MED ORDER — FENTANYL CITRATE (PF) 100 MCG/2ML IJ SOLN
INTRAMUSCULAR | Status: AC
  Filled 2023-10-18: qty 2

## 2023-10-18 MED ORDER — TIMOLOL MALEATE 0.5 % OP SOLN
1.0000 [drp] | Freq: Every day | OPHTHALMIC | Status: DC
  Administered 2023-10-19 – 2023-10-31 (×13): 1 [drp] via OPHTHALMIC
  Filled 2023-10-18: qty 5

## 2023-10-18 MED ORDER — DIPHENHYDRAMINE HCL 50 MG/ML IJ SOLN: 12.5000 mg | Freq: Four times a day (QID) | INTRAMUSCULAR | Status: DC | PRN

## 2023-10-18 MED ORDER — CALCIUM CARBONATE ANTACID 500 MG PO CHEW
1.0000 | CHEWABLE_TABLET | Freq: Every day | ORAL | Status: DC
  Administered 2023-10-19 – 2023-10-31 (×11): 200 mg via ORAL
  Filled 2023-10-18 (×12): qty 1

## 2023-10-18 MED ORDER — ONDANSETRON 4 MG PO TBDP
4.0000 mg | ORAL_TABLET | Freq: Four times a day (QID) | ORAL | Status: DC | PRN
  Filled 2023-10-18: qty 1

## 2023-10-18 MED ORDER — ROCURONIUM BROMIDE 100 MG/10ML IV SOLN
INTRAVENOUS | Status: DC | PRN
  Administered 2023-10-18 (×2): 20 mg via INTRAVENOUS
  Administered 2023-10-18: 10 mg via INTRAVENOUS
  Administered 2023-10-18: 20 mg via INTRAVENOUS
  Administered 2023-10-18: 10 mg via INTRAVENOUS
  Administered 2023-10-18: 60 mg via INTRAVENOUS

## 2023-10-18 MED ORDER — LOPERAMIDE HCL 2 MG PO CAPS: 2.0000 mg | ORAL_CAPSULE | Freq: Four times a day (QID) | ORAL | Status: DC | PRN

## 2023-10-18 MED ORDER — OXYCODONE HCL 5 MG/5ML PO SOLN
5.0000 mg | Freq: Once | ORAL | Status: AC | PRN
  Administered 2023-10-18: 5 mg via ORAL

## 2023-10-18 MED ORDER — AMLODIPINE BESYLATE 10 MG PO TABS
10.0000 mg | ORAL_TABLET | Freq: Every day | ORAL | Status: DC
  Administered 2023-10-18 – 2023-10-20 (×3): 10 mg via ORAL
  Filled 2023-10-18 (×3): qty 1

## 2023-10-18 MED ORDER — FENTANYL CITRATE (PF) 50 MCG/ML IJ SOSY
PREFILLED_SYRINGE | INTRAMUSCULAR | Status: AC
  Filled 2023-10-18: qty 1

## 2023-10-18 MED ORDER — SODIUM CHLORIDE 0.9% FLUSH: 3.0000 mL | INTRAVENOUS | Status: DC | PRN

## 2023-10-18 MED ORDER — BENAZEPRIL HCL 20 MG PO TABS
40.0000 mg | ORAL_TABLET | Freq: Every day | ORAL | Status: DC
  Administered 2023-10-18 – 2023-10-23 (×6): 40 mg via ORAL
  Filled 2023-10-18 (×6): qty 2

## 2023-10-18 MED ORDER — DEXAMETHASONE SOD PHOSPHATE PF 10 MG/ML IJ SOLN
INTRAMUSCULAR | Status: DC | PRN
  Administered 2023-10-18: 6 mg via INTRAVENOUS

## 2023-10-18 MED ORDER — CHLORHEXIDINE GLUCONATE 0.12 % MT SOLN
15.0000 mL | Freq: Once | OROMUCOSAL | Status: AC
  Administered 2023-10-18: 15 mL via OROMUCOSAL

## 2023-10-18 MED ORDER — SUGAMMADEX SODIUM 200 MG/2ML IV SOLN
INTRAVENOUS | Status: DC | PRN
  Administered 2023-10-18: 200 mg via INTRAVENOUS

## 2023-10-18 MED ORDER — BUPIVACAINE-EPINEPHRINE (PF) 0.25% -1:200000 IJ SOLN
INTRAMUSCULAR | Status: AC
  Filled 2023-10-18: qty 60

## 2023-10-18 MED ORDER — OXYCODONE HCL 5 MG PO TABS
5.0000 mg | ORAL_TABLET | ORAL | Status: DC | PRN
  Administered 2023-10-18 – 2023-10-26 (×19): 10 mg via ORAL
  Filled 2023-10-18 (×19): qty 2

## 2023-10-18 MED ORDER — FAMOTIDINE 20 MG PO TABS
20.0000 mg | ORAL_TABLET | Freq: Every day | ORAL | Status: DC
  Administered 2023-10-19 – 2023-10-26 (×8): 20 mg via ORAL
  Filled 2023-10-18 (×8): qty 1

## 2023-10-18 MED ORDER — BUPIVACAINE LIPOSOME 1.3 % IJ SUSP
INTRAMUSCULAR | Status: AC
  Filled 2023-10-18: qty 20

## 2023-10-18 MED ORDER — SODIUM CHLORIDE 0.9 % IV SOLN
250.0000 mL | INTRAVENOUS | Status: DC | PRN
  Administered 2023-10-27: 250 mL via INTRAVENOUS

## 2023-10-18 MED ORDER — LIDOCAINE HCL (PF) 2 % IJ SOLN
INTRAMUSCULAR | Status: AC
  Filled 2023-10-18: qty 5

## 2023-10-18 MED ORDER — PROPOFOL 10 MG/ML IV BOLUS
INTRAVENOUS | Status: AC
  Filled 2023-10-18: qty 20

## 2023-10-18 MED ORDER — ENSURE PRE-SURGERY PO LIQD: 296.0000 mL | Freq: Once | ORAL | Status: DC

## 2023-10-18 MED ORDER — LABETALOL HCL 100 MG PO TABS
100.0000 mg | ORAL_TABLET | Freq: Two times a day (BID) | ORAL | Status: DC
  Administered 2023-10-18: 50 mg via ORAL
  Administered 2023-10-19: 100 mg via ORAL
  Administered 2023-10-19: 50 mg via ORAL
  Administered 2023-10-20 (×2): 100 mg via ORAL
  Administered 2023-10-21: 50 mg via ORAL
  Administered 2023-10-21: 100 mg via ORAL
  Administered 2023-10-22: 50 mg via ORAL
  Administered 2023-10-22: 100 mg via ORAL
  Administered 2023-10-23: 50 mg via ORAL
  Administered 2023-10-23 – 2023-10-24 (×2): 100 mg via ORAL
  Administered 2023-10-24: 50 mg via ORAL
  Administered 2023-10-25 – 2023-10-26 (×3): 100 mg via ORAL
  Filled 2023-10-18 (×16): qty 1

## 2023-10-18 MED ORDER — BUPIVACAINE-EPINEPHRINE 0.25% -1:200000 IJ SOLN
INTRAMUSCULAR | Status: DC | PRN
  Administered 2023-10-18: 30 mL

## 2023-10-18 MED ORDER — PSYLLIUM 95 % PO PACK
1.0000 | PACK | Freq: Two times a day (BID) | ORAL | Status: DC
  Administered 2023-10-18 – 2023-10-20 (×5): 1 via ORAL
  Filled 2023-10-18 (×5): qty 1

## 2023-10-18 MED ORDER — CYCLOBENZAPRINE HCL 5 MG PO TABS
5.0000 mg | ORAL_TABLET | Freq: Three times a day (TID) | ORAL | Status: DC | PRN
  Administered 2023-10-19 – 2023-10-20 (×2): 10 mg via ORAL
  Administered 2023-10-21 – 2023-10-22 (×2): 5 mg via ORAL
  Administered 2023-10-22 – 2023-10-26 (×5): 10 mg via ORAL
  Filled 2023-10-18 (×5): qty 2
  Filled 2023-10-18 (×2): qty 1
  Filled 2023-10-18 (×3): qty 2

## 2023-10-18 MED ORDER — ASPIRIN 81 MG PO TBEC
81.0000 mg | DELAYED_RELEASE_TABLET | Freq: Every day | ORAL | Status: DC
Start: 2023-10-19 — End: 2023-10-26
  Administered 2023-10-19 – 2023-10-26 (×8): 81 mg via ORAL
  Filled 2023-10-18 (×8): qty 1

## 2023-10-18 MED ORDER — ORAL CARE MOUTH RINSE
15.0000 mL | Freq: Once | OROMUCOSAL | Status: AC
Start: 2023-10-18 — End: 2023-10-18

## 2023-10-18 MED ORDER — METRONIDAZOLE 500 MG/100ML IV SOLN
500.0000 mg | INTRAVENOUS | Status: AC
  Administered 2023-10-18: 500 mg via INTRAVENOUS
  Filled 2023-10-18: qty 100

## 2023-10-18 MED ORDER — ONDANSETRON HCL 4 MG/2ML IJ SOLN: 4.0000 mg | Freq: Once | INTRAMUSCULAR | Status: DC | PRN

## 2023-10-18 MED ORDER — 0.9 % SODIUM CHLORIDE (POUR BTL) OPTIME
TOPICAL | Status: DC | PRN
  Administered 2023-10-18: 1000 mL

## 2023-10-18 MED ORDER — CALCIUM CARBONATE 1250 (500 CA) MG PO TABS
2.0000 | ORAL_TABLET | Freq: Two times a day (BID) | ORAL | Status: DC
  Administered 2023-10-19 – 2023-10-31 (×20): 2500 mg via ORAL
  Filled 2023-10-18 (×22): qty 2

## 2023-10-18 MED ORDER — FENTANYL CITRATE (PF) 50 MCG/ML IJ SOSY
25.0000 ug | PREFILLED_SYRINGE | INTRAMUSCULAR | Status: DC | PRN
  Administered 2023-10-18: 50 ug via INTRAVENOUS
  Administered 2023-10-18 (×2): 25 ug via INTRAVENOUS

## 2023-10-18 MED ORDER — NAPHAZOLINE-GLYCERIN 0.012-0.25 % OP SOLN
1.0000 [drp] | Freq: Four times a day (QID) | OPHTHALMIC | Status: DC | PRN
Start: 2023-10-18 — End: 2023-10-31

## 2023-10-18 MED ORDER — BUPIVACAINE LIPOSOME 1.3 % IJ SUSP
INTRAMUSCULAR | Status: DC | PRN
  Administered 2023-10-18: 20 mL

## 2023-10-18 MED ORDER — CEFAZOLIN SODIUM-DEXTROSE 2-4 GM/100ML-% IV SOLN
2.0000 g | Freq: Three times a day (TID) | INTRAVENOUS | Status: AC
  Administered 2023-10-18: 2 g via INTRAVENOUS
  Filled 2023-10-18: qty 100

## 2023-10-18 MED ORDER — LACTATED RINGERS IV SOLN: Freq: Three times a day (TID) | INTRAVENOUS | Status: DC | PRN

## 2023-10-18 MED ORDER — LACTATED RINGERS IV SOLN: INTRAVENOUS | Status: DC

## 2023-10-18 MED ORDER — OXYCODONE HCL 5 MG PO TABS: 5.0000 mg | ORAL_TABLET | Freq: Once | ORAL | Status: AC | PRN

## 2023-10-18 MED ORDER — MAGNESIUM HYDROXIDE 400 MG/5ML PO SUSP: 30.0000 mL | Freq: Every day | ORAL | Status: DC | PRN

## 2023-10-18 MED ORDER — TRAZODONE HCL 50 MG PO TABS
50.0000 mg | ORAL_TABLET | Freq: Every day | ORAL | Status: DC
  Administered 2023-10-18 – 2023-10-25 (×8): 50 mg via ORAL
  Filled 2023-10-18 (×8): qty 1

## 2023-10-18 MED ORDER — MAGIC MOUTHWASH: 15.0000 mL | Freq: Four times a day (QID) | ORAL | Status: DC | PRN

## 2023-10-18 MED ORDER — SODIUM CHLORIDE 0.9 % IV SOLN
2.0000 g | INTRAVENOUS | Status: DC
  Administered 2023-10-18 – 2023-10-20 (×3): 2 g via INTRAVENOUS
  Filled 2023-10-18 (×3): qty 2

## 2023-10-18 MED ORDER — TRAMADOL HCL 50 MG PO TABS: 50.0000 mg | ORAL_TABLET | Freq: Four times a day (QID) | ORAL | 0 refills | Status: DC | PRN

## 2023-10-18 MED ORDER — GLYCOPYRROLATE 0.2 MG/ML IJ SOLN
INTRAMUSCULAR | Status: DC | PRN
  Administered 2023-10-18: .1 mg via INTRAVENOUS

## 2023-10-18 MED ORDER — MUSCLE RUB 10-15 % EX CREA
1.0000 | TOPICAL_CREAM | Freq: Two times a day (BID) | CUTANEOUS | Status: DC | PRN
Start: 2023-10-18 — End: 2023-10-31

## 2023-10-18 MED ORDER — ONDANSETRON HCL 4 MG/2ML IJ SOLN
INTRAMUSCULAR | Status: AC
  Filled 2023-10-18: qty 2

## 2023-10-18 MED ORDER — CYCLOBENZAPRINE HCL 5 MG PO TABS: 5.0000 mg | ORAL_TABLET | Freq: Three times a day (TID) | ORAL | 2 refills | Status: AC | PRN

## 2023-10-18 MED ORDER — TRAMADOL HCL 50 MG PO TABS
50.0000 mg | ORAL_TABLET | Freq: Four times a day (QID) | ORAL | Status: DC | PRN
  Administered 2023-10-19 – 2023-10-26 (×3): 100 mg via ORAL
  Filled 2023-10-18 (×4): qty 2

## 2023-10-18 MED ORDER — MENTHOL 3 MG MT LOZG: 1.0000 | LOZENGE | OROMUCOSAL | Status: DC | PRN

## 2023-10-18 MED ORDER — ONDANSETRON HCL 4 MG/2ML IJ SOLN
INTRAMUSCULAR | Status: DC | PRN
  Administered 2023-10-18: 4 mg via INTRAVENOUS

## 2023-10-18 MED ADMIN — PROPOFOL 200 MG/20ML IV EMUL: 100 mg | INTRAVENOUS | NDC 00069020910

## 2023-10-18 MED ADMIN — Cefazolin Sodium-Dextrose IV Solution 2 GM/100ML-4%: 2 g | INTRAVENOUS | NDC 00338350841

## 2023-10-18 MED ADMIN — Acetaminophen Tab 500 MG: 1000 mg | ORAL | NDC 00904673061

## 2023-10-18 MED ADMIN — PROPOFOL 200 MG/20ML IV EMUL: 30 mg | INTRAVENOUS | NDC 00069020910

## 2023-10-18 MED ADMIN — Lidocaine HCl(Cardiac) IV PF Soln Pref Syr 100 MG/5ML (2%): 60 mg | INTRAVENOUS | NDC 00409132305

## 2023-10-18 MED FILL — Cefazolin Sodium-Dextrose IV Solution 2 GM/100ML-4%: 2.0000 g | INTRAVENOUS | Qty: 100 | Status: AC

## 2023-10-18 MED FILL — Acetaminophen Tab 500 MG: 1000.0000 mg | ORAL | Qty: 2 | Status: AC

## 2023-10-18 SURGICAL SUPPLY — 79 items
APPLICATOR COTTON TIP 6 STRL (MISCELLANEOUS) ×2 IMPLANT
BAG COUNTER SPONGE SURGICOUNT (BAG) ×2 IMPLANT
BAG URINE DRAIN 2000ML AR STRL (UROLOGICAL SUPPLIES) IMPLANT
BARRIER SKIN OD1.75 2 1/4 FLNG (WOUND CARE) IMPLANT
BLADE SURG 15 STRL LF DISP TIS (BLADE) IMPLANT
BLADE SURG SZ11 CARB STEEL (BLADE) ×2 IMPLANT
CATH FOLEY 2WAY SLVR 5CC 14FR (CATHETERS) IMPLANT
CHLORAPREP W/TINT 26 (MISCELLANEOUS) ×2 IMPLANT
CLIP APPLIE 5 13 M/L LIGAMAX5 (MISCELLANEOUS) IMPLANT
COVER SURGICAL LIGHT HANDLE (MISCELLANEOUS) ×2 IMPLANT
COVER TIP SHEARS 8 DVNC (MISCELLANEOUS) IMPLANT
DEFOGGER SCOPE WARM SEASHARP (MISCELLANEOUS) IMPLANT
DEVICE SECURE STRAP 25 ABSORB (INSTRUMENTS) IMPLANT
DEVICE TROCAR PUNCTURE CLOSURE (ENDOMECHANICALS) IMPLANT
DRAIN CHANNEL 19F RND (DRAIN) IMPLANT
DRAPE ARM DVNC X/XI (DISPOSABLE) ×8 IMPLANT
DRAPE COLUMN DVNC XI (DISPOSABLE) ×2 IMPLANT
DRIVER NDL LRG 8 DVNC XI (INSTRUMENTS) IMPLANT
DRIVER NDL MEGA SUTCUT DVNCXI (INSTRUMENTS) IMPLANT
DRIVER NDLE LRG 8 DVNC XI (INSTRUMENTS) IMPLANT
DRIVER NDLE MEGA SUTCUT DVNCXI (INSTRUMENTS) ×1 IMPLANT
DRSG TEGADERM 2-3/8X2-3/4 SM (GAUZE/BANDAGES/DRESSINGS) ×10 IMPLANT
DRSG TEGADERM 4X4.75 (GAUZE/BANDAGES/DRESSINGS) IMPLANT
ELECT PENCIL ROCKER SW 15FT (MISCELLANEOUS) IMPLANT
ELECT REM PT RETURN 15FT ADLT (MISCELLANEOUS) ×2 IMPLANT
EVACUATOR DRAINAGE 10X20 100CC (DRAIN) IMPLANT
EVACUATOR SILICONE 100CC (DRAIN) IMPLANT
FORCEPS PROGRASP DVNC XI (FORCEP) IMPLANT
GAUZE SPONGE 2X2 8PLY STRL LF (GAUZE/BANDAGES/DRESSINGS) ×2 IMPLANT
GLOVE BIO SURGEON STRL SZ 6.5 (GLOVE) IMPLANT
GLOVE BIOGEL PI IND STRL 6 (GLOVE) IMPLANT
GLOVE BIOGEL PI IND STRL 8 (GLOVE) IMPLANT
GLOVE ECLIPSE 8.0 STRL XLNG CF (GLOVE) ×4 IMPLANT
GLOVE INDICATOR 6.5 STRL GRN (GLOVE) IMPLANT
GLOVE INDICATOR 8.0 STRL GRN (GLOVE) ×4 IMPLANT
GOWN SRG XL LVL 4 BRTHBL STRL (GOWNS) IMPLANT
GOWN STRL REUS W/ TWL LRG LVL3 (GOWN DISPOSABLE) IMPLANT
GOWN STRL REUS W/ TWL XL LVL3 (GOWN DISPOSABLE) ×4 IMPLANT
GRASPER SUT TROCAR 14GX15 (MISCELLANEOUS) IMPLANT
GRASPER TIP-UP FEN DVNC XI (INSTRUMENTS) IMPLANT
IRRIGATION SUCT STRKRFLW 2 WTP (MISCELLANEOUS) IMPLANT
KIT BASIN OR (CUSTOM PROCEDURE TRAY) ×2 IMPLANT
KIT TURNOVER KIT A (KITS) ×2 IMPLANT
MESH VENTRALIGHT ST 10X13IN (Mesh General) IMPLANT
NDL HYPO 22X1.5 SAFETY MO (MISCELLANEOUS) ×2 IMPLANT
NDL SPNL 22GX3.5 QUINCKE BK (NEEDLE) IMPLANT
NEEDLE HYPO 22X1.5 SAFETY MO (MISCELLANEOUS) ×1 IMPLANT
NEEDLE SPNL 22GX3.5 QUINCKE BK (NEEDLE) ×1 IMPLANT
PACK CARDIOVASCULAR III (CUSTOM PROCEDURE TRAY) ×2 IMPLANT
PAD POSITIONING PINK XL (MISCELLANEOUS) ×2 IMPLANT
PENCIL SMOKE EVACUATOR (MISCELLANEOUS) IMPLANT
PROTECTOR NERVE ULNAR (MISCELLANEOUS) IMPLANT
SCISSORS LAP 5X45 EPIX DISP (ENDOMECHANICALS) IMPLANT
SCISSORS MNPLR CVD DVNC XI (INSTRUMENTS) IMPLANT
SEAL UNIV 5-12 XI (MISCELLANEOUS) ×8 IMPLANT
SEALER VESSEL EXT DVNC XI (MISCELLANEOUS) ×2 IMPLANT
SOLUTION ELECTROSURG ANTI STCK (MISCELLANEOUS) ×2 IMPLANT
SPIKE FLUID TRANSFER (MISCELLANEOUS) ×2 IMPLANT
SPONGE DRAIN TRACH 4X4 STRL 2S (GAUZE/BANDAGES/DRESSINGS) IMPLANT
STRIP CLOSURE SKIN 1/2X4 (GAUZE/BANDAGES/DRESSINGS) IMPLANT
SUT ETHIBOND 0 36 GRN (SUTURE) IMPLANT
SUT ETHIBOND NAB CT1 #1 30IN (SUTURE) IMPLANT
SUT MNCRL AB 4-0 PS2 18 (SUTURE) ×2 IMPLANT
SUT PDS AB 1 CT1 27 (SUTURE) IMPLANT
SUT PROLENE 2 0 SH DA (SUTURE) IMPLANT
SUT STRATA 2-0 15 CT-1.5 (SUTURE) IMPLANT
SUT STRATA PDS 0 30 CT-2.5 (SUTURE) IMPLANT
SUT STRATA PDS 2-0 23 CT-1 (SUTURE) IMPLANT
SUT VIC AB 2-0 SH 18 (SUTURE) IMPLANT
SUT VICRYL 0 UR6 27IN ABS (SUTURE) IMPLANT
SUTURE STRAFIX SYMMETRC 1-0 12 (SUTURE) IMPLANT
SUTURE VLOC BRB 180 ABS3/0GR12 (SUTURE) IMPLANT
SYR 10ML LL (SYRINGE) ×2 IMPLANT
SYR 20ML LL LF (SYRINGE) ×2 IMPLANT
TOWEL OR 17X26 10 PK STRL BLUE (TOWEL DISPOSABLE) ×2 IMPLANT
TRAY FOLEY MTR SLVR 14FR STAT (SET/KITS/TRAYS/PACK) IMPLANT
TRAY FOLEY MTR SLVR 16FR STAT (SET/KITS/TRAYS/PACK) IMPLANT
TROCAR ADV FIXATION 5X100MM (TROCAR) IMPLANT
TUBING INSUFFLATION 10FT LAP (TUBING) ×2 IMPLANT

## 2023-10-18 NOTE — Transfer of Care (Signed)
 Immediate Anesthesia Transfer of Care Note  Patient: Donna [name]  Procedure(s) Performed: Procedure(s) with comments: REPAIR, HERNIA, PARASTOMAL, ROBOT-ASSISTED (N/A) - ROBOTIC PARASTOMAL & INCISIONAL HERNIA REPAIRS WITH MESH REPAIR, HERNIA, VENTRAL, ROBOT-ASSISTED (N/A)  Patient Location: PACU  Anesthesia Type:General  Level of Consciousness:  sedated, patient cooperative and responds to stimulation  Airway & Oxygen Therapy:Patient Spontanous Breathing and Patient connected to face mask oxgen  Post-op Assessment:  Report given to PACU RN and Post -op Vital signs reviewed and stable  Post vital signs:  Reviewed and stable  Last Vitals:  Vitals:   10/18/23 0853  BP: 139/66  Pulse: 63  Resp: 16  Temp: 36.6 C  SpO2: 99%    Complications: No apparent anesthesia complications

## 2023-10-18 NOTE — Interval H&P Note (Signed)
 History and Physical Interval Note:  10/18/2023 9:53 AM  Donna [name]  has presented today for surgery, with the diagnosis of PARASTOMAL HERNIA  AND VENTRAL INCISIONAL RECURRENT AND INCARCERATED ABDOMINAL WALL HERNIAE.  The various methods of treatment have been discussed with the patient and family. After consideration of risks, benefits and other options for treatment, the patient has consented to  Procedure(s) with comments: REPAIR, HERNIA, PARASTOMAL, ROBOT-ASSISTED (N/A) - ROBOTIC PARASTOMAL & INCISIONAL HERNIA REPAIRS WITH MESH REPAIR, HERNIA, VENTRAL, ROBOT-ASSISTED (N/A) as a surgical intervention.  The patient's history has been reviewed, patient examined, no change in status, stable for surgery.  I have reviewed the patient's chart and labs.  Questions were answered to the patient's satisfaction.    I have re-reviewed the the patient's records, history, medications, and allergies.  I have re-examined the patient.  I again discussed intraoperative plans and goals of post-operative recovery.  The patient agrees to proceed.  Donna [name]  [birth year] [id number]  Patient Care Team: Clarice Nottingham, MD as PCP - General (Internal Medicine) Elmira Newman PARAS, MD as PCP - Cardiology (Cardiology) Teresa Lonni HERO, MD as Consulting Physician (Colon and Rectal Surgery) Sheldon Standing, MD as Consulting Physician (General Surgery) Donnald Charleston, MD as Consulting Physician (Gastroenterology) Elmira Newman PARAS, MD as Consulting Physician (Cardiology)  Patient Active Problem List   Diagnosis Date Noted   Fatigue 04/26/2022   Dyspnea on exertion 03/20/2021   Elevated coronary artery calcium score 03/19/2021   S/P total colectomy 05/29/2019   Primary osteoarthritis of left knee 06/20/2018   Primary hypertension 01/21/2015   GERD (gastroesophageal reflux disease) 01/21/2015   Degeneration of lumbar or lumbosacral intervertebral disc 01/21/2015   Hyperlipidemia 01/21/2015    [name] (HCC) 01/13/2015   Bilateral carpal tunnel syndrome 01/05/2015    Past Medical History:  Diagnosis Date   Anemia    Arthritis    Bulging lumbar disc    Cancer (HCC)    colon   Degeneration of lumbar or lumbosacral intervertebral disc 01/21/2015   Essential hypertension 01/21/2015   GERD (gastroesophageal reflux disease) 01/21/2015   Heart murmur    ECHO 05-17-2023   Hyperlipidemia 01/21/2015   Hypertension    Pre-diabetes    diet   Ulcerative colitis [facility])     Past Surgical History:  Procedure Laterality Date   BIOPSY  06/29/2022   Procedure: BIOPSY;  Surgeon: Teresa Lonni HERO, MD;  Location: [facility];  Service: General;;   CESAREAN SECTION     COLON SURGERY  2015   for twisted bowel   ERCP N/A 07/10/2023   Procedure: ERCP, WITH INTERVENTION IF INDICATED;  Surgeon: Rosalie Kitchens, MD;  Location: [facility];  Service: Gastroenterology;  Laterality: N/A;   EYE SURGERY Right    cataract extraction   FLEXIBLE SIGMOIDOSCOPY N/A 05/18/2020   Procedure: SURVEILLANCE FLEXIBLE SIGMOIDOSCOPY;  Surgeon: Teresa Lonni HERO, MD;  Location: [facility];  Service: General;  Laterality: N/A;   FLEXIBLE SIGMOIDOSCOPY N/A 06/29/2022   Procedure: FLEXIBLE SIGMOIDOSCOPY;  Surgeon: Teresa Lonni HERO, MD;  Location: [facility];  Service: General;  Laterality: N/A;   FLEXIBLE SIGMOIDOSCOPY Left 08/29/2023   Procedure: KINGSTON SIDE;  Surgeon: Teresa Lonni HERO, MD;  Location: [facility];  Service: General;  Laterality: Left;   INCISIONAL HERNIA REPAIR N/A 05/29/2019   Procedure: PRIMARY INCISIONAL HERNIA REPAIR;  Surgeon: Teresa Lonni HERO, MD;  Location: [facility];  Service: General;  Laterality: N/A;   LAPAROTOMY Right 01/12/2015   Procedure: OPEN RIGHT COLECTOMY;  Surgeon: Donnice Bury, MD;  Location: [facility] OR;  Service: General;  Laterality: Right;   RIGHT COLECTOMY  01/12/2015   TOTAL KNEE ARTHROPLASTY Left 06/20/2018   Procedure: TOTAL  KNEE ARTHROPLASTY;  Surgeon: Yvone Rush, MD;  Location: [facility];  Service: Orthopedics;  Laterality: Left;    Social History   Socioeconomic History   Marital status: Divorced    Spouse name: Not on file   Number of children: 3   Years of education: Not on file   Highest education level: Not on file  Occupational History   Not on file  Tobacco Use   Smoking status: Never    Passive exposure: Past   Smokeless tobacco: Never  Vaping Use   Vaping status: Never Used  Substance and Sexual Activity   Alcohol use: No   Drug use: No   Sexual activity: Not Currently  Other Topics Concern   Not on file  Social History Narrative   Not on file   Social Drivers of Health   Financial Resource Strain: Not on file  Food Insecurity: No Food Insecurity (11/03/2021)   Hunger Vital Sign    Worried About Running Out of Food in the Last Year: Never true    Ran Out of Food in the Last Year: Never true  Transportation Needs: No Transportation Needs (08/02/2021)   PRAPARE - Administrator, Civil Service (Medical): No    Lack of Transportation (Non-Medical): No  Physical Activity: Not on file  Stress: Not on file  Social Connections: Not on file  Intimate Partner Violence: Not on file    Family History  Problem Relation Age of Onset   Diabetes Mother    Ulcerative colitis Father    Heart failure Sister    Sleep apnea Son     Medications Prior to Admission  Medication Sig Dispense Refill Last Dose/Taking   acetaminophen  (TYLENOL ) 500 MG tablet Take 1,000 mg by mouth every 6 (six) hours as needed for mild pain or headache.   10/17/2023 Morning   amLODipine  (NORVASC ) 10 MG tablet Take 10 mg by mouth at bedtime.   10/17/2023 Bedtime   aspirin  EC 81 MG tablet Take 1 tablet (81 mg total) by mouth daily. Swallow whole. 90 tablet 3 10/17/2023 Morning   B Complex Vitamins (B COMPLEX 100 PO) Take 1 tablet by mouth daily.   10/11/2023   benazepril  (LOTENSIN ) 40 MG tablet Take 40 mg by  mouth at bedtime.   10/17/2023 Morning   calcium carbonate (OS-CAL - DOSED IN MG OF ELEMENTAL CALCIUM) 1250 (500 Ca) MG tablet Take 2 tablets by mouth 2 (two) times daily with a meal.   10/11/2023   calcium carbonate (TUMS - DOSED IN MG ELEMENTAL CALCIUM) 500 MG chewable tablet Chew 1 tablet by mouth daily.   10/17/2023 Noon   Evolocumab  (REPATHA  SURECLICK) 140 MG/ML SOAJ Inject 140 mg (1 pen) into the skin every 14 days 6 mL 3 10/08/2023   famotidine (PEPCID) 20 MG tablet Take 20 mg by mouth daily.   10/18/2023 at  7:30 AM   hydrochlorothiazide  (HYDRODIURIL ) 25 MG tablet Take 25 mg by mouth daily.   10/17/2023 Morning   labetalol  (NORMODYNE ) 100 MG tablet TAKE 1 TABLET TWICE DAILY (NEED TO KEEP UPCOMING APPOINTMENT IN APRIL TO PREVENT ANY MISSED DOSES) 180 tablet 3 10/18/2023 at  7:30 AM   latanoprost  (XALATAN ) 0.005 % ophthalmic solution Place 1 drop into both eyes at bedtime.   10/17/2023 Bedtime   loperamide  (IMODIUM )  2 MG capsule Take 1 capsule (2 mg total) by mouth 2 (two) times daily. (Patient taking differently: Take 4 mg by mouth in the morning, at noon, and at bedtime.) 60 capsule 1 10/17/2023 Bedtime   Menthol-Methyl Salicylate (MUSCLE RUB) 10-15 % CREA Apply 1 application topically 2 (two) times daily as needed for muscle pain.   Past Month   Multiple Vitamin (MULTIVITAMIN WITH MINERALS) TABS tablet Take 1 tablet by mouth daily. Woman's one a day   10/11/2023   NON FORMULARY Pt uses a cpap nightly   10/17/2023 Bedtime   pantoprazole  (PROTONIX ) 40 MG tablet Take 40 mg by mouth 2 (two) times daily before a meal.    10/18/2023 at  7:30 AM   psyllium (HYDROCIL/METAMUCIL) 95 % PACK Take 1 packet by mouth daily. (Patient taking differently: Take 1 packet by mouth 3 (three) times daily.) 30 packet 1 10/17/2023 Evening   timolol (TIMOPTIC) 0.5 % ophthalmic solution Place 1 drop into both eyes every morning.   10/18/2023 Morning   traMADol  (ULTRAM ) 50 MG tablet Take 50 mg by mouth every 12 (twelve)  hours as needed for severe pain (pain score 7-10).   10/18/2023 Morning   traZODone (DESYREL) 50 MG tablet Take 50 mg by mouth at bedtime.   10/17/2023 Bedtime    Current Facility-Administered Medications  Medication Dose Route Frequency Provider Last Rate Last Admin   bupivacaine  liposome (EXPAREL ) 1.3 % injection 266 mg  20 mL Infiltration Once Sheldon Standing, MD       ceFAZolin  (ANCEF ) IVPB 2g/100 mL premix  2 g Intravenous On Call to OR Sheldon Standing, MD       And   metroNIDAZOLE  (FLAGYL ) IVPB 500 mg  500 mg Intravenous On Call to OR Sheldon Standing, MD       Chlorhexidine  Gluconate Cloth 2 % PADS 6 each  6 each Topical Once Sheldon Standing, MD       And   Chlorhexidine  Gluconate Cloth 2 % PADS 6 each  6 each Topical Once Addelynn Batte, Standing, MD       gabapentin  (NEURONTIN ) capsule 300 mg  300 mg Oral On Call to OR Sheldon Standing, MD       lactated ringers  infusion   Intravenous Continuous Paul Lamarr BRAVO, MD 10 mL/hr at 10/18/23 9052 Continued from Pre-op at 10/18/23 0947     Allergies  Allergen Reactions   Ezetimibe Other (See Comments)    Unknown   Gabapentin  Nausea And Vomiting    N/V and headaches   Iodinated Contrast Media Other (See Comments)    Feels faint, coughing, bp elevated   Mesalamine Other (See Comments)    Unknown   Ozempic (0.25 Or 0.5 Mg-Dose) [Semaglutide(0.25 Or 0.5mg -Dos)] Nausea And Vomiting   Prilosec [Omeprazole ]     Made legs hurt, hair fell out, and nauseated.    Statins Other (See Comments)    Muscle spasms     BP 139/66 (BP Location: Right Arm)   Pulse 63   Temp 97.9 F (36.6 C) (Oral)   Resp 16   Ht 5' 3 (1.6 m)   Wt 78.9 kg   SpO2 99%   BMI 30.82 kg/m   Labs: No results found for this or any previous visit (from the past 48 hours).  Imaging / Studies: No results found.   Briant KYM Sheldon, M.D., F.A.C.S. Gastrointestinal and Minimally Invasive Surgery [facility] [address] [place], [place]  [address] [phone] Main / Paging  10/18/2023 9:53 AM    Elspeth JAYSON Schultze

## 2023-10-18 NOTE — Anesthesia Preprocedure Evaluation (Addendum)
 Anesthesia Evaluation  Patient identified by MRN, date of birth, ID band Patient awake    Reviewed: Allergy & Precautions, NPO status , Patient's Chart, lab work & pertinent test results, reviewed documented beta blocker date and time   History of Anesthesia Complications Negative for: history of anesthetic complications  Airway Mallampati: II  TM Distance: >3 FB Neck ROM: Full    Dental  (+) Dental Advisory Given, Teeth Intact   Pulmonary sleep apnea and Continuous Positive Airway Pressure Ventilation    Pulmonary exam normal        Cardiovascular hypertension, Pt. on medications and Pt. on home beta blockers + CAD  Normal cardiovascular exam     Neuro/Psych  Neuromuscular disease  negative psych ROS   GI/Hepatic Neg liver ROS, PUD,GERD  Medicated and Controlled,, UC    Endo/Other   Obesity Pre-DM   Renal/GU Renal InsufficiencyRenal disease     Musculoskeletal  (+) Arthritis ,    Abdominal   Peds  Hematology  (+) Blood dyscrasia, anemia   Anesthesia Other Findings   Reproductive/Obstetrics                              Anesthesia Physical Anesthesia Plan  ASA: 3  Anesthesia Plan: General   Post-op Pain Management: Tylenol  PO (pre-op)*   Induction: Intravenous  PONV Risk Score and Plan: 3 and Treatment may vary due to age or medical condition, Ondansetron  and Propofol  infusion  Airway Management Planned: Oral ETT  Additional Equipment: None  Intra-op Plan:   Post-operative Plan: Extubation in OR  Informed Consent: I have reviewed the patients History and Physical, chart, labs and discussed the procedure including the risks, benefits and alternatives for the proposed anesthesia with the patient or authorized representative who has indicated his/her understanding and acceptance.     Dental advisory given  Plan Discussed with: CRNA and Anesthesiologist  Anesthesia  Plan Comments:          Anesthesia Quick Evaluation

## 2023-10-18 NOTE — Anesthesia Procedure Notes (Signed)
 Procedure Name: Intubation Date/Time: 10/18/2023 10:58 AM  Performed by: Erick Fitz, CRNAPre-anesthesia Checklist: Patient identified, Emergency Drugs available, Suction available, Patient being monitored and Timeout performed Patient Re-evaluated:Patient Re-evaluated prior to induction Oxygen Delivery Method: Circle system utilized Preoxygenation: Pre-oxygenation with 100% oxygen Induction Type: IV induction Ventilation: Mask ventilation without difficulty Laryngoscope Size: Mac and 3 Grade View: Grade II Tube type: Oral Number of attempts: 2 (epiglottis only visualized; cords very anterior; sucessful intubation on 2nd attempt;) Airway Equipment and Method: Stylet Placement Confirmation: ETT inserted through vocal cords under direct vision, positive ETCO2, CO2 detector and breath sounds checked- equal and bilateral Secured at: 21 cm Tube secured with: Tape (secured with pink Hy-tape) Dental Injury: Teeth and Oropharynx as per pre-operative assessment

## 2023-10-18 NOTE — Consult Note (Signed)
 [facility] team consulted for revision of ileostomy. Patient with history of abdominal proctocolectomy with end ileostomy in 2021  Underwent repair of incarcerated hernia with revision of end ileostomy 10/18/2023 Dr. Sheldon.   [facility] team will follow Monday 10/21/2023 to assess hernia and determine best pouching system moving forward.   Thank you,    Powell Bar MSN, RN-BC, [employer]

## 2023-10-18 NOTE — Discharge Instructions (Addendum)
 [facility] REPAIR: POST OP INSTRUCTIONS  ######################################################################  EAT Gradually transition to a high fiber diet with a fiber supplement over the next few weeks after discharge.  Start with a pureed / full liquid diet (see below)  WALK Walk an hour a day.  Control your pain to do that.    CONTROL PAIN Control pain so that you can walk, sleep, tolerate sneezing/coughing, and go up/down stairs.  HAVE A BOWEL MOVEMENT DAILY Keep your bowels regular to avoid problems.  OK to try a laxative to override constipation.  OK to use an antidairrheal to slow down diarrhea.  Call if not better after 2 tries  CALL IF YOU HAVE PROBLEMS/CONCERNS Call if you are still struggling despite following these instructions. Call if you have concerns not answered by these instructions  ######################################################################    DIET: Follow a light bland diet & liquids the first 24 hours after arrival home, such as soup, liquids, starches, etc.  Be sure to drink plenty of fluids.  Quickly advance to a usual solid diet within a few days.  Avoid fast food or heavy meals as your are more likely to get nauseated or have irregular bowels.  A low-fat, high-fiber diet for the rest of your life is ideal.   Take your usually prescribed home medications unless otherwise directed.  PAIN CONTROL: Pain is best controlled by a usual combination of three different methods TOGETHER: Ice/Heat Over the counter pain medication Prescription pain medication Most patients will experience some swelling and bruising around the hernia(s) such as the bellybutton, groins, or old incisions.  Ice packs or heating pads (30-60 minutes up to 6 times a day) will help. Use ice for the first few days to help decrease swelling and bruising, then switch to heat to help relax tight/sore spots and speed recovery.  Some people prefer to use ice alone, heat alone, alternating  between ice & heat.  Experiment to what works for you.  Swelling and bruising can take several weeks to resolve.   It is helpful to take an over-the-counter pain medication regularly for the first few weeks.  Choose one of the following that works best for you: Naproxen (Aleve, etc)  Two 220mg  tabs twice a day Ibuprofen  (Advil , etc) Three 200mg  tabs four times a day (every meal & bedtime) Acetaminophen  (Tylenol , etc) 325-650mg  four times a day (every meal & bedtime) A  prescription for pain medication should be given to you upon discharge.  Take your pain medication as prescribed.  If you are having problems/concerns with the prescription medicine (does not control pain, nausea, vomiting, rash, itching, etc), please call us  ([phone]) [phone] to see if we need to switch you to a different pain medicine that will work better for you and/or control your side effect better. If you need a refill on your pain medication, please contact your pharmacy.  They will contact our office to request authorization. Prescriptions will not be filled after 5 pm or on week-ends.  AVOID GETTING CONSTIPATED.   Between the surgery and the pain medications, it is common to experience some constipation.  Drink plenty of liquids Take a fiber supplement 2 times day (such as Metamucil, Citrucel, FiberCon, MiraLax , etc) to have a bowel movement every day. If you have not had a BM by 2 days after surgery: -drink liquids only until you have a bowel movement -take MiraLAX  2 doses every 2 hours until you have a bowel movement   Wash / shower every day.  You may  shower over the dressings as they are waterproof.    It is good for closed incisions and even open wounds to be washed every day.  Shower every day.  Short baths are fine.  Wash the incisions and wounds clean with soap & water .    You may leave closed incisions open to air if it is dry.   You may cover the incision with clean gauze & replace it after your daily shower for  comfort.  TEGADERM & STERISTRIPS:  You have clear gauze band-aid dressings over your closed incision(s).  You also have skin tapes called Steristrips on your incisions.  Leave these in place.  You may trim any edges that curl up with clean scissors.  You may remove all dressings & tapes in the shower in 2 days = 10/19 /Sunday.    ACTIVITIES as tolerated:   You may resume regular (light) daily activities beginning the next day--such as daily self-care, walking, climbing stairs--gradually increasing activities as tolerated.  Control your pain so that you can walk an hour a day.  If you can walk 30 minutes without difficulty, it is safe to try more intense activity such as jogging, treadmill, bicycling, low-impact aerobics, swimming, etc. Save the most intensive and strenuous activity for last such as sit-ups, heavy lifting, contact sports, etc  Refrain from any heavy lifting or straining until you are off narcotics for pain control.   DO NOT PUSH THROUGH PAIN.  Let pain be your guide: If it hurts to do something, don't do it.  Pain is your body warning you to avoid that activity for another week until the pain goes down. You may drive when you are no longer taking prescription pain medication, you can comfortably wear a seatbelt, and you can safely maneuver your car and apply brakes. You may have sexual intercourse when it is comfortable.   FOLLOW UP in our office Please call [facility] at [phone] to set up an appointment to see your surgeon in the office for a follow-up appointment approximately 2-3 weeks after your surgery. Make sure that you call for this appointment the day you arrive home to insure a convenient appointment time.  9.  If you have disability of FMLA / Family leave forms, please bring the forms to the office for processing.  (do not give to your surgeon).  WHEN TO CALL US  ([phone]) [phone]: Poor pain control Reactions / problems with new medications (rash/itching, nausea, etc)   Fever over 101.5 F (38.5 C) Inability to urinate Nausea and/or vomiting Worsening swelling or bruising Continued bleeding from incision. Increased pain, redness, or drainage from the incision   The clinic staff is available to answer your questions during regular business hours (8:30am-5pm).  Please don't hesitate to call and ask to speak to one of our nurses for clinical concerns.   If you have a medical emergency, go to the nearest emergency room or call 911.  A surgeon from [facility] is always on call at the hospitals in [facility], [place] [address], [place], [place]  [address] ?  [address] [address], [place], [place]   [address] MAIN: [phone] ? TOLL FREE: [phone] ? FAX: [phone] [email or web address]   #######################################################  Ostomy Support Information  You've heard that people get along just fine with only one of their eyes, or one of their lungs, or one of their kidneys. But you also know that you have only one intestine and only  one bladder, and that leaves you feeling awfully empty, both physically and emotionally: You think no other people go around without part of their intestine with the ends of their intestines sticking out through their abdominal walls.   YOU ARE NOT ALONE.  There are nearly three quarters of a million people in the [place]  who have an ostomy; people who have had surgery to remove all or part of their colons or bladders.   There is even a national association, the [employer] with over 350 local affiliated support groups that are organized by volunteers who provide peer support and counseling. [facility] has a toll free telephone num-ber, [phone] [phone] and an educational, interactive website, www.ostomy.org   An ostomy is an opening in the belly (abdominal wall) made by surgery. Ostomates are people who have had this procedure. The  opening (stoma) allows the kidney or bowel to grdischarge waste. An external pouch covers the stoma to collect waste. Pouches are are a simple bag and are odor free. Different companies have disposable or reusable pouches to fit one's lifestyle. An ostomy can either be temporary or permanent.   THERE ARE THREE MAIN TYPES OF OSTOMIES Colostomy. A colostomy is a surgically created opening in the large intestine (colon). Ileostomy. An ileostomy is a surgically created opening in the small intestine. Urostomy. A urostomy is a surgically created opening to divert urine away from the bladder.  OSTOMY Care  The following guidelines will make care of your colostomy easier. Keep this information close by for quick reference.  Helpful DIET hints Eat a well-balanced diet including vegetables and fresh fruits. Eat on a regular schedule.  Drink at least 6 to 8 glasses of fluids daily. Eat slowly in a relaxed atmosphere. Chew your food thoroughly. Avoid chewing gum, smoking, and drinking from a straw. This will help decrease the amount of air you swallow, which may help reduce gas. Eating yogurt or drinking buttermilk may help reduce gas.  To control gas at night, do not eat after 8 p.m. This will give your bowel time to quiet down before you go to bed.  If gas is a problem, you can purchase Beano. Sprinkle Beano on the first bite of food before eating to reduce gas. It has no flavor and should not change the taste of your food. You can buy Beano over the counter at your local drugstore.  Foods like fish, onions, garlic, broccoli, asparagus, and cabbage produce odor. Although your pouch is odor-proof, if you eat these foods you may notice a stronger odor when emptying your pouch. If this is a concern, you may want to limit these foods in your diet.  If you have an ileostomy, you will have chronic diarrhea & need to drink more liquids to avoid getting dehydrated.  Consider antidiarrheal medicine like imodium   (loperamide ) or Lomotil to help slow down bowel movements / diarrhea into your ileostomy bag.  GETTING TO GOOD BOWEL HEALTH WITH AN ILEOSTOMY    With the colon bypassed & not in use, you will have small bowel diarrhea.   It is important to thicken & slow your bowel movements down.   The goal: 4-6 small BOWEL MOVEMENTS A DAY It is important to drink plenty of liquids to avoid getting dehydrated  CONTROLLING ILEOSTOMY DIARRHEA  TAKE A FIBER SUPPLEMENT (FiberCon or Benefiner soluble fiber) twice a day - to thicken stools by absorbing excess fluid and retrain the intestines to act more normally.  Slowly increase the dose over  a few weeks.  Too much fiber too soon can backfire and cause cramping & bloating.  TAKE AN IRON SUPPLEMENT twice a day to naturally constipate your bowels.  Usually ferrous sulfate 325mg  twice a day)  TAKE ANTI-DIARRHEAL MEDICINES: Loperamide  (Imodium ) can slow down diarrhea.  Start with two tablets (= 4mg ) first and then try one tablet every 6 hours.  Can go up to 2 pills four times day (8 pills of 2mg  max) Avoid if you are having fevers or severe pain.  If you are not better or start feeling worse, stop all medicines and call your doctor for advice LoMotil (Diphenoxylate / Atropine) is another medicine that can constipate & slow down bowel moevements Pepto Bismol (bismuth) can gently thicken bowels as well  If diarrhea is worse,: drink plenty of liquids and try simpler foods for a few days to avoid stressing your intestines further. Avoid dairy products (especially milk & ice cream) for a short time.  The intestines often can lose the ability to digest lactose when stressed. Avoid foods that cause gassiness or bloating.  Typical foods include beans and other legumes, cabbage, broccoli, and dairy foods.  Every person has some sensitivity to other foods, so listen to our body and avoid those foods that trigger problems for you.Call your doctor if you are getting worse or not  better.  Sometimes further testing (cultures, endoscopy, X-ray studies, bloodwork, etc) may be needed to help diagnose and treat the cause of the diarrhea. Take extra anti-diarrheal medicines (maximum is 8 pills of 2mg  loperamide  a day)   Tips for POUCHING an OSTOMY   Changing Your Pouch The best time to change your pouch is in the morning, before eating or drinking anything. Your stoma can function at any time, but it will function more after eating or drinking.   Applying the pouching system  Place all your equipment close at hand before removing your pouch.  Wash your hands.  Stand or sit in front of a mirror. Use the position that works best for you. Remember that you must keep the skin around the stoma wrinkle-free for a good seal.  Gently remove the used pouch (1-piece system) or the pouch and old wafer (2-piece system). Empty the pouch into the toilet. Save the closure clip to use again.  Wash the stoma itself and the skin around the stoma. Your stoma may bleed a little when being washed. This is normal. Rinse and pat dry. You may use a wash cloth or soft paper towels (like Bounty), mild soap (like Dial, Safeguard, or Ivory), and water . Avoid soaps that contain perfumes or lotions.  For a new pouch (1-piece system) or a new wafer (2-piece system), measure your stoma using the stoma guide in each box of supplies.  Trace the shape of your stoma onto the back of the new pouch or the back of the new wafer. Cut out the opening. Remove the paper backing and set it aside.  Optional: Apply a skin barrier powder to surrounding skin if it is irritated (bare or weeping), and dust off the excess. Optional: Apply a skin-prep wipe (such as Skin Prep or All-Kare) to the skin around the stoma, and let it dry. Do not apply this solution if the skin is irritated (red, tender, or broken) or if you have shaved around the stoma. Optional: Apply a skin barrier paste (such as Stomahesive, Coloplast, or  Premium) around the opening cut in the back of the pouch or wafer. Allow it  to dry for 30 to 60 seconds.  Hold the pouch (1-piece system) or wafer (2-piece system) with the sticky side toward your body. Make sure the skin around the stoma is wrinkle-free. Center the opening on the stoma, then press firmly to your abdomen (Fig. 4). Look in the mirror to check if you are placing the pouch, or wafer, in the right position. For a 2-piece system, snap the pouch onto the wafer. Make sure it snaps into place securely.  Place your hand over the stoma and the pouch or wafer for about 30 seconds. The heat from your hand can help the pouch or wafer stick to your skin.  Add deodorant (such as Super Banish or Nullo) to your pouch. Other options include food extracts such as vanilla oil and peppermint extract. Add about 10 drops of the deodorant to the pouch. Then apply the closure clamp. Note: Do not use toxic  chemicals or commercial cleaning agents in your pouch. These substances may harm the stoma.  Optional: For extra seal, apply tape to all 4 sides around the pouch or wafer, as if you were framing a picture. You may use any brand of medical adhesive tape. Change your pouch every 5 to 7 days. Change it immediately if a leak occurs.  Wash your hands afterwards.  If you are wearing a 2-piece system, you may use 2 new pouches per week and alternate them. Rinse the pouch with mild soap and warm water  and hang it to dry for the next day. Apply the fresh pouch. Alternate the 2 pouches like this for a week. After a week, change the wafer and begin with 2 new pouches. Place the old pouches in a plastic bag, and put them in the trash.   LIVING WITH AN OSTOMY  Emptying Your Pouch Empty your pouch when it is one-third full (of urine, stool, and/or gas). If you wait until your pouch is fuller than this, it will be more difficult to empty and more noticeable. When you empty your pouch, either put toilet paper in the  toilet bowl first, or flush the toilet while you empty the pouch. This will reduce splashing. You can empty the pouch between your legs or to one side while sitting, or while standing or stooping. If you have a 2-piece system, you can snap off the pouch to empty it. Remember that your stoma may function during this time. If you wish to rinse your pouch after you empty it, a turkey baster can be helpful. When using a baster, squirt water  up into the pouch through the opening at the bottom. With a 2-piece system, you can snap off the pouch to rinse it. After rinsing  your pouch, empty it into the toilet. When rinsing your pouch at home, put a few granules of Dreft soap in the rinse water . This helps lubricate and freshen your pouch. The inside of your pouch can be sprayed with non-stick cooking oil (Pam spray). This may help reduce stool sticking to the inside of the pouch.  Bathing You may shower or bathe with your pouch on or off. Remember that your stoma may function during this time.  The materials you use to wash your stoma and the skin around it should be clean, but they do not need to be sterile.  Wearing Your Pouch During hot weather, or if you perspire a lot in general, wear a cover over your pouch. This may prevent a rash on your skin under the pouch. Pouch  covers are sold at ostomy supply stores. Wear the pouch inside your underwear for better support. Watch your weight. Any gain or loss of 10 to 15 pounds or more can change the way your pouch fits.  Going Away From Home A collapsible cup (like those that come in travel kits) or a soft plastic squirt bottle with a pull-up top (like a travel bottle for shampoo) can be used for rinsing your pouch when you are away from home. Tilt the opening of the pouch at an upward angle when using a cup to rinse.  Carry wet wipes or extra tissues to use in public bathrooms.  Carry an extra pouching system with you at all times.  Never keep ostomy  supplies in the glove compartment of your car. Extreme heat or cold can damage the skin barriers and adhesive wafers on the pouch.  When you travel, carry your ostomy supplies with you at all times. Keep them within easy reach. Do not pack ostomy supplies in baggage that will be checked or otherwise separated from you, because your baggage might be lost. If you're traveling out of the country, it is helpful to have a letter stating that you are carrying ostomy supplies as a medical necessity.  If you need ostomy supplies while traveling, look in the yellow pages of the telephone book under "Surgical Supplies." Or call the local ostomy organization to find out where supplies are available.  Do not let your ostomy supplies get low. Always order new pouches before you use the last one.  Reducing Odor Limit foods such as broccoli, cabbage, onions, fish, and garlic in your diet to help reduce odor. Each time you empty your pouch, carefully clean the opening of the pouch, both inside and outside, with toilet paper. Rinse your pouch 1 or 2 times daily after you empty it (see directions for emptying your pouch and going away from home). Add deodorant (such as Super Banish or Nullo) to your pouch. Use air deodorizers in your bathroom. Do not add aspirin  to your pouch. Even though aspirin  can help prevent odor, it could cause ulcers on your stoma.  When to call the doctor Call the doctor if you have any of the following symptoms: Purple, black, or white stoma Severe cramps lasting more than 6 hours Severe watery discharge from the stoma lasting more than 6 hours No output from the colostomy for 3 days Excessive bleeding from your stoma Swelling of your stoma to more than 1/2-inch larger than usual Pulling inward of your stoma below skin level Severe skin irritation or deep ulcers Bulging or other changes in your abdomen  When to call your ostomy nurse Call your ostomy/enterostomal therapy ([facility])  nurse if any of the following occurs: Frequent leaking of your pouching system Change in size or appearance of your stoma, causing discomfort or problems with your pouch Skin rash or rawness Weight gain or loss that causes problems with your pouch     FREQUENTLY ASKED QUESTIONS   Why haven't you met any of these folks who have an ostomy?  Well, maybe you have! You just did not recognize them because an ostomy doesn't show. It can be kept secret if you wish. Why, maybe some of your best friends, office associates or neighbors have an ostomy ... you never can tell. People facing ostomy surgery have many quality-of-life questions like: Will you bulge? Smell? Make noises? Will you feel waste leaving your body? Will you be a captive of the toilet?  Will you starve? Be a social outcast? Get/stay married? Have babies? Easily bathe, go swimming, bend over?  OK, let's look at what you can expect:   Will you bulge?  Remember, without part of the intestine or bladder, and its contents, you should have a flatter tummy than before. You can expect to wear, with little exception, what you wore before surgery ... and this in-cludes tight clothing and bathing suits.   Will you smell?  Today, thanks to modern odor proof pouching systems, you can walk into an ostomy support group meeting and not smell anything that is foul or offensive. And, for those with an ileostomy or colostomy who are concerned about odor when emptying their pouch, there are in-pouch deodorants that can be used to eliminate any waste odors that may exist.   Will you make noises?  Everyone produces gas, especially if they are an air-swallower. But intestinal sounds that occur from time to time are no differ-ent than a gurgling tummy, and quite often your clothing will muffle any sounds.   Will you feel the waste discharges?  For those with a colostomy or ileostomy there might be a slight pressure when waste leaves your body, but understand  that the intestines have no nerve endings, so there will be no unpleasant sensations. Those with a urostomy will probably be unaware of any kidney drainage.   Will you be a captive of the toilet?  Immediately post-op you will spend more time in the bathroom than you will after your body recovers from surgery. Every person is different, but on average those with an ileostomy or urostomy may empty their pouches 4 to 6 times a day; a little  less if you have a colostomy. The average wear time between pouch system changes is 3 to 5 days and the changing process should take less than 30 minutes.   Will I need to be on a special diet? Most people return to their normal diet when they have recovered from surgery. Be sure to chew your food well, eat a well-balanced diet and drink plenty of fluids. If you experience problems with a certain food, wait a couple of weeks and try it again.  Will there be odor and noises? Pouching systems are designed to be odor-proof or odor-resistant. There are deodorants that can be used in the pouch. Medications are also available to help reduce odor. Limit gas-producing foods and carbonated beverages. You will experience less gas and fewer noises as you heal from surgery.  How much time will it take to care for my ostomy? At first, you may spend a lot of time learning about your ostomy and how to take care of it. As you become more comfortable and skilled at changing the pouching system, it will take very little time to care for it.   Will I be able to return to work? People with ostomies can perform most jobs. As soon as you have healed from surgery, you should be able to return to work. Heavy lifting (more than 10 pounds) may be discouraged.   What about intimacy? Sexual relationships and intimacy are important and fulfilling aspects of your life. They should continue after ostomy surgery. Intimacy-related concerns should be discussed openly between you and your partner.    Can I wear regular clothing? You do not need to wear special clothing. Ostomy pouches are fairly flat and barely noticeable. Elastic undergarments will not hurt the stoma or prevent the ostomy from functioning.   Can  I participate in sports? An ostomy should not limit your involvement in sports. Many people with ostomies are runners, skiers, swimmers or participate in other active lifestyles. Talk with your caregiver first before doing heavy physical activity.  Will you starve?  Not if you follow doctor's orders at each stage of your post-op adjustment. There is no such thing as an "ostomy diet". Some people with an ostomy will be able to eat and tolerate anything; others may find diffi-culty with some foods. Each person is an individual and must determine, by trial, what is best for them. A good practice for all is to drink plenty of water .   Will you be a social outcast?  Have you met anyone who has an ostomy and is a social outcast? Why should you be the first? Only your attitude and self image will effect how you are treated. No confi-dent person is an investment banker, corporate.    PROFESSIONAL HELP   Resources are available if you need help or have questions about your ostomy.   Specially trained nurses called Wound, Ostomy Continence Nurses (WOCN) are available for consultation in most major medical centers.  Consider getting an ostomy consult at an outpatient ostomy clinic.   [facility] has an [facility] run by an Chartered Certified Accountant at the [facility].  [phone]. [facility] can help set up an appointment   The [employer] (UOA) is a group made up of many local chapters throughout the [place] . These local groups hold meetings and provide support to prospective and existing ostomates. They sponsor educational events and have qualified visitors to make personal or telephone visits. Contact the UOA for the chapter nearest you and for other  educational publications.  More detailed information can be found in Colostomy Guide, a publication of the [employer] (UOA). Contact UOA at 1-210-481-1186 or visit their web site at yellowspecialist.at. The website contains links to other sites, suppliers and resources.  [employer] Start Services: Start at the website to enlist for support.  Your Wound Ostomy (WOCN) nurse may have started this process. [email or web address].[email or web address]/securestart Secure Start services are designed to support people as they live their lives with an ostomy or neurogenic bladder. Enrolling is easy and at no cost to the patient. We realize that each person's needs and life journey are different. Through Secure Start services, we want to help people live their life, their way.  SEEK MEDICAL CARE IF: The drainage develops a bad odor.  You have an oral temperature above 101.5 F (38.5 C).  The amount of drainage from your wound suddenly increases or decreases.  You accidentally pull out your drain.  You have any other questions or concerns.      Call our office if you have any questions about your drain. [phone]

## 2023-10-18 NOTE — Op Note (Signed)
 10/18/2023  PATIENT:  Donna [name]  78 y.o. female  Patient Care Team: Clarice Nottingham, MD as PCP - General (Internal Medicine) Elmira Newman PARAS, MD as PCP - Cardiology (Cardiology) Teresa Lonni HERO, MD as Consulting Physician (Colon and Rectal Surgery) Sheldon Standing, MD as Consulting Physician (General Surgery) Donnald Charleston, MD as Consulting Physician (Gastroenterology) Elmira Newman PARAS, MD as Consulting Physician (Cardiology)  PRE-OPERATIVE DIAGNOSIS:  PARASTOMAL HERNIA  AND VENTRAL INCISIONAL RECURRENT AND INCARCERATED ABDOMINAL WALL HERNIAE Dimensions of hernia by pre-op evaluation:  9cm x 8cm  POST-OPERATIVE DIAGNOSIS:  PARASTOMAL HERNIA AND VENTRAL INCISIONAL RECURRENT AND INCARCERATED ABDOMINAL WALL HERNIAE  Dimensions of hernia post-op:  16cm x 8cm  PROCEDURE:   ROBOTIC COMPONENT SEPARATION (transversus abdominis release = TAR) on right Repair of incarcerated recurrent incisional and parastomal hernias with mesh ROBOTIC LYSIS OF ADHESIONS SEROSAL REPAIR x2 END ILEOSTOMY REVISION TAP BLOCK - BILATERAL  SURGEON:  Standing KYM Sheldon, MD  ASSISTANT:  (n/a)   ANESTHESIA:  General endotracheal intubation anesthesia (GETA) and Regional TRANSVERSUS ABDOMINIS PLANE (TAP) nerve block -BILATERAL for perioperative & postoperative pain control at the level of the transverse abdominis & preperitoneal spaces along the flank at the anterior axillary line, from subcostal ridge to iliac crest under laparoscopic guidance provided with liposomal bupivacaine  (Experel) 20mL mixed with 60 mL of bupivicaine 0.25% with epinephrine   Estimated Blood Loss (EBL):   Total I/O In: 2200 [I.V.:2000; IV Piggyback:200] Out: 395 [Urine:360; Blood:35].   (See anesthesia record)  Delay start of Pharmacological VTE agent (>24hrs) due to concerns of significant anemia, surgical blood loss, or risk of bleeding?:  yes  DRAINS: (None) and 19 Fr JP drain rests in retrorectus space between  abdominal wall and mesh`  SPECIMEN:  Ileostomy  DISPOSITION OF SPECIMEN:  Pathology  COUNTS:  Sponge, needle, & instrument counts CORRECT at the conclusion of the case.      PLAN OF CARE: Admit to inpatient   PATIENT DISPOSITION:  PACU - hemodynamically stable.  INDICATION: Pleasant patient has developed a ventral wall abdominal hernia.  History of ulcer colitis with cancer requiring abdominal proctocolectomy with permanent end ileostomy.  Recurrent incisional hernia primarily repaired.  4 years later has developed recurrent hernia in the midline and hernia in the right side around permanent end ileostomy.  Both containing small bowel.  Recommendation was made for surgical repair  The anatomy & physiology of the abdominal wall was discussed. The pathophysiology of hernias was discussed. Natural history risks without surgery including progeressive enlargement, pain, incarceration & strangulation was discussed. Contributors to complications such as smoking, obesity, diabetes, prior surgery, etc were discussed.  I feel the risks of no intervention will lead to serious problems that outweigh the operative risks; therefore, I recommended surgery to reduce and repair the hernia. I explained laparoscopic techniques with possible need for an open approach. I noted the probable use of mesh to patch and/or buttress the hernia repair.  Risks such as bleeding, infection, abscess, need for further treatment, heart attack, death, and other risks were discussed. I noted a good likelihood this will help address the problem. Goals of post-operative recovery were discussed as well. Possibility that this will not correct all symptoms was explained. I stressed the importance of low-impact activity, aggressive pain control, avoiding constipation, & not pushing through pain to minimize risk of post-operative chronic pain or injury. Possibility of reherniation especially with smoking, obesity, diabetes, immunosuppression,  and other health conditions was discussed. We will work to minimize complications.  An educational handout further explaining the pathology & treatment options was given as well. Questions were answered. The patient expresses understanding & wishes to proceed with surgery.   OR FINDINGS: Moderately dense adhesions small bowel to anterior abdominal wall and periumbilical midline hernia.  Swiss cheese hernias extending up to the subxiphoid region.  15 cm in length.  Moderate parastomal hernia around end ileostomy with a foot of small bowel densely adherent to the hernia sac.  Serosal repairs down x 2.  Ileostomy revision done for last 7 cm given poor tissues.  Type of ventral wall repair:  Laparoscopic underlay repair .with Primary repair of largest hernia Placement of mesh: Right lateral and flank retrorectus preperitoneal.  Midline intraperitoneal. Name of mesh: Bard Ventralight dual sided (polypropylene / Seprafilm) Size of mesh: 33x27cm Orientation: Transverse Mesh overlap:  7cm    DESCRIPTION:   Informed consent was confirmed. The patient underwent general anaesthesia without difficulty. The patient was positioned appropriately. VTE prevention in place. The patient's abdomen was clipped, prepped, & draped in a sterile fashion. Surgical timeout confirmed our plan.  The patient was positioned in reverse Trendelenburg. Abdominal entry was gained using various technique.  Robotic 8 mm port placed. Camera inspection revealed no injury. Extra ports were carefully placed under direct laparoscopic visualization.  Robot was carefully docked.  I could see hernias on the parietal peritoneum under the abdominal wall.  I did robotic lysis of adhesions to expose the entire anterior abdominal wall.  I primarily used focused sharp dissection.  Able to reduce small bowel out of the recurrent midline incision.  However small bowel was very densely adherent in the parastomal defect.  Took quite some time to  gradually free the adhesions of redundant twisted small bowel up in the moderately large hernia sac.  Eventually was able to free down and straighten down.  Bowel was somewhat clustered upon itself and there was a right lower quadrant retroperitoneal adhesion that was causing sort of a chronic trapped area of bowel and in sort of a closed-loop situation.  Mild transition point but no evidence of any ischemia.  Meticulously freed off interloop adhesions from the end ileostomy to the ligament of Treitz.  Freed adhesions off greater omentum as well to better define the anatomy.  Freed off adhesions to left over old mesocolon.  Did inspection and repaired 2 areas of serosa using 3-0 V-Loc in a running fashion  I freed off the falciform ligament and central peritoneum to expose the retrorectus fascia.  This exposed Swiss cheese hernias going up to the xiphoid as well.  Moderate diastases recti.  Peritoneum was rather scarred the midline.  Is not really able to preserve any hernia sac..  The rectus myofascial release was completed robotically on the RIGHT side.  Once the midline had been freed off carefully we worked to enter onto the right side.  After identification of the right rectus sheath, it was incised vertically to enter the retrorectus space on the right.   Came 1 cm right lateral to the linea alba to be get back into the retrorectus space on the right side.  A crossover was performed dissecting under the linea alba in the preperitoneal plane until the right rectus sheath was identified.Dissection was carried down inferiorly preserving the peritoneum and the preperitoneal fat in the midline as it was gently dissected off of the overlying linea alba.  On the right side, the posterior rectus sheath was progressively disconnected from its insertion on the linea alba  1 cm lateral. This allowed for progression of the right side rectus myofascial release.  The rectus myofascial release accomplished medialization of  the posterior rectus sheath towards the midline and disinsertion of the rectus muscle from its surrounding fascia, and thus its encasement in the rectus sheath, allowing for widening of the rectus muscle and transfer of the rectus flap towards the midline.  This will allow for future inset of the medial aspect of the flap for abdominal wall reconstruction.  I was apparent that I cannot get the recurrent midline incision together without tension.  Therefore , a transversus abdominis release (TAR) was performed on the RIGHT side.  The transversus abdominis muscle was identified deep to the posterior rectus sheath and incised vertically along its entire length, entering the pre-peritoneal or pre-transversalis fascia plane.  This disinserted the transversus abdominis muscle from the linea semilunaris.  Since the intercostal nerves, arteries and veins had been preserved during the rectus myofascial release portion of the procedure, they remained intact during the TAR. The peritoneum was subsequently peeled away from the underside of the divided transversus abdominis muscle.  This dissection was carried out laterally towards the retroperitoneum.  The TAR accomplished additional medialization of the posterior rectus sheath with its attached peritoneum towards the midline to allow for visceral sac closure.  The TAR also provided further offset of tension of the rectus muscle flap with additional transfer of the rectus muscle towards the midline, as it remained attached to the external and internal abdominal oblique muscles.  This will allow for future inset of the medial aspect of the flap for abdominal wall reconstruction.   This help bring the hernia defects better together.  We lowered insufflation.  The midline the hernia defect was closed utilizing a continuous, #1 Ethicon Stratafix Symmetric PDS suture in a running fashion x 2 sutures.  The hernia defect, and subsequently the rectus musculature, came together well  for a complete abdominal wall reconstruction.    Inspected the end ileostomy.  Distal 7 cm was quite poor tissue given the concrete fibrotic adhesions to the hernia sac.  I did not feel it was salvageable.  Already had a fair amount of redundancy of bowel and some laxity mesentery.  I transected the end ileostomy mesentery radially to an area that looked more viable with less adhesions.  Made sure it laid in a way that the end ileostomy mesentery would be exposed to the mesh and the serosa was tacked to the anterior abdominal wall.  Talked that back in the hernia sac.  I primarily closed the parastomal hernia defect using #1 Stratafix as well.  Closed it superior to inferior since that seem to have less tension.  I then ran that stitch to tack the ileum to the right anterior abdominal wall towards the flank to help tack the ileum laterally.  I initially was hoping to do a robotic E tap but peritoneum was quite poor and there was no omentum to help close the defects.  I therefore decided to place a larger mesh in intraperitoneally laparoscopically.  I mapped out the region using a needle passer.   To minimize recurrence & ensure that I would have at least 5 cm radial coverage beyond the hernia defect, I chose a 33x27cm dual sided mesh.  I placed #1 PDS stitches around its edge right lateral x 2 and inferior x 2 and left lateral x 2.  I rolled the mesh & placed into the peritoneal cavity through left upper quadrant  12 mm port sitet.  I unrolled the mesh and positioned it appropriately.  I secured the mesh at the right flank with PDS tails just superior and just inferior to the end ileostomy towards the right flank in the classic Sugarbaker fashion.  He was #1 PDS tails and brought them out to the right posterior axillary line.  Make sure it was snug without being obstructed.  Use laparoscopic suture passer to bring up the tails of the PDS through the abdominal wall & tagged them with clamps for good transfascial  suturing.  I finished placing sutures inferiorly and left laterally as well.  I let some of the mesh down and used a tacker to help tack the mesh transversely superior to the end ileostomy mesentery and inferiorly to the end ileostomy mesentery.  Started out in four corners to make sure I had the mesh centered under the hernia defect appropriately, and then proceeded to work in quadrants.  I had split the peritoneum on the right side transversely around the ostomy and tacked it back up such that the mesh 50% was covered by peritoneum in the right flank and right side.  I tacked the edges & central part of the mesh to the peritoneum/posterior rectus fascia with SecureStrap absorbable tacks.   I did reinspection. Hemostasis was good. Mesh laid well. I completed a broad field block of local anesthesia at fascial stitch sites & fascial closure areas.  Given the need for serosal pair and moderate hernia sac defects I placed a 19 [place] Blake drain through the left upper quadrant port site anterior to the mesh in the retrorectus space to help decrease seroma formation and protect the mesh given the need for serosal repair and ileostomy revision mobilized one omentum there was to try and help cover the central abdomen.  Capnoperitoneum was evacuated. Ports were removed. The skin was closed with Monocryl at the port sites and Steri-Strips on the fascial stitch puncture sites.  I then excised around the end ileostomy after dripping away from the other dressings.  Came through the dermal adhesions and eviscerated the ileum and found the area with mesentery still intact.  I matured the ileostomy 1 cm using interrupted Vicryl sutures starting in 4 quadrants and then circumferentially with interrupted sutures.  Fingers patient could go across the fascia.  Closure was snug but can easily allow my finger and not much else.  Clean ostomy appliance placed.  Patient is being extubated to go to the recovery room.  I discussed  operative findings, updated the patient's status, discussed probable steps to recovery, and gave postoperative recommendations to the patient's son, Darlynn Ada. .  Recommendations were made.  Questions were answered.  He expressed understanding & appreciation.  Elspeth KYM Schultze, M.D., F.A.C.S. Gastrointestinal and Minimally Invasive Surgery [facility] [address], [address] [place], [place] [address] [phone] Main / Paging  10/18/2023 5:19 PM

## 2023-10-19 LAB — CBC
HCT: 34.2 % — ABNORMAL LOW (ref 36.0–46.0)
Hemoglobin: 11.3 g/dL — ABNORMAL LOW (ref 12.0–15.0)
MCH: 30.4 pg (ref 26.0–34.0)
MCHC: 33 g/dL (ref 30.0–36.0)
MCV: 91.9 fL (ref 80.0–100.0)
Platelets: 157 K/uL (ref 150–400)
RBC: 3.72 MIL/uL — ABNORMAL LOW (ref 3.87–5.11)
RDW: 14.1 % (ref 11.5–15.5)
WBC: 5.3 K/uL (ref 4.0–10.5)
nRBC: 0 % (ref 0.0–0.2)

## 2023-10-19 LAB — POTASSIUM: Potassium: 4.3 mmol/L (ref 3.5–5.1)

## 2023-10-19 LAB — CREATININE, SERUM
Creatinine, Ser: 1.35 mg/dL — ABNORMAL HIGH (ref 0.44–1.00)
GFR, Estimated: 41 mL/min — ABNORMAL LOW (ref >60)

## 2023-10-19 MED ORDER — KCL IN DEXTROSE-NACL 20-5-0.45 MEQ/L-%-% IV SOLN
INTRAVENOUS | Status: DC
  Filled 2023-10-19: qty 1000

## 2023-10-19 NOTE — Progress Notes (Signed)
 1 Day Post-Op   Subjective/Chief Complaint: Sore, no ileostomy output yet, not oob yet   Objective: Vital signs in last 24 hours: Temp:  [96.9 F (36.1 C)-99.5 F (37.5 C)] 99.5 F (37.5 C) (10/18 0636) Pulse Rate:  [66-81] 81 (10/18 0636) Resp:  [12-28] 18 (10/18 0636) BP: (117-171)/(55-93) 117/55 (10/18 0636) SpO2:  [96 %-100 %] 100 % (10/18 0636)    Intake/Output from previous day: 10/17 0701 - 10/18 0700 In: 3537.9 [P.O.:840; I.V.:2397.8; IV Piggyback:300.1] Out: 3285 [Urine:2510; Drains:640; Stool:100; Blood:35] Intake/Output this shift: No intake/output data recorded.  General nad Cv regular Pulm effort normal Ab tender throughout as expected, ileostomy pink with minimal bloody liquid in bag (no air/stool), incisions clean, drain serosang  Lab Results:  Recent Labs    10/19/23 0535  WBC 5.3  HGB 11.3*  HCT 34.2*  PLT 157   BMET Recent Labs    10/19/23 0535  K 4.3  CREATININE 1.35*   PT/INR No results for input(s): LABPROT, INR in the last 72 hours. ABG No results for input(s): PHART, HCO3 in the last 72 hours.  Invalid input(s): PCO2, PO2  Studies/Results: No results found.  Anti-infectives: Anti-infectives (From admission, onward)    Start     Dose/Rate Route Frequency Ordered Stop   10/18/23 2200  cefoTEtan  (CEFOTAN ) 2 g in sodium chloride  0.9 % 100 mL IVPB       Note to Pharmacy: Pharmacy may adjust dosing strength, schedule, rate of infusion, etc as needed to optimize therapy (has CKD)   2 g 200 mL/hr over 30 Minutes Intravenous Every 24 hours 10/18/23 1848 10/23/23 2159   10/18/23 1930  ceFAZolin  (ANCEF ) IVPB 2g/100 mL premix        2 g 200 mL/hr over 30 Minutes Intravenous Every 8 hours 10/18/23 1848 10/18/23 2014   10/18/23 0845  ceFAZolin  (ANCEF ) IVPB 2g/100 mL premix       Placed in And Linked Group   2 g 200 mL/hr over 30 Minutes Intravenous On call to O.R. 10/18/23 0839 10/18/23 1122   10/18/23 0845  metroNIDAZOLE   (FLAGYL ) IVPB 500 mg       Placed in And Linked Group   500 mg 100 mL/hr over 60 Minutes Intravenous On call to O.R. 10/18/23 0839 10/18/23 1146       Assessment/Plan: POD 1 TAR, ileostomy revision -will leave on fulls for now and not advance until output given exam -pain control -oob, pulm toilet -home meds -lovenox  per Dr Sheldon -Cr will follow    Donna [name] 10/19/2023

## 2023-10-19 NOTE — Progress Notes (Signed)
 Mobility Specialist - Progress Note   10/19/23 1500  Mobility  Activity Ambulated with assistance  Level of Assistance Minimal assist, patient does 75% or more  Assistive Device Front wheel walker  Distance Ambulated (ft) 14 ft  Range of Motion/Exercises Active  Activity Response Tolerated well  Mobility Referral Yes  Mobility visit 1 Mobility  Mobility Specialist Start Time (ACUTE ONLY) 1340  Mobility Specialist Stop Time (ACUTE ONLY) 1359  Mobility Specialist Time Calculation (min) (ACUTE ONLY) 19 min   Received in chair and agreed to mobility. Had c/o 10/10 pain in abdomen. Returned to bed with all needs met. Alarm on.  Cyndee Ada Mobility Specialist

## 2023-10-19 NOTE — Plan of Care (Signed)
 ?  Problem: Clinical Measurements: ?Goal: Will remain free from infection ?Outcome: Progressing ?  ?

## 2023-10-20 LAB — BASIC METABOLIC PANEL WITH GFR
Anion gap: 11 (ref 5–15)
BUN: 28 mg/dL — ABNORMAL HIGH (ref 8–23)
CO2: 23 mmol/L (ref 22–32)
Calcium: 8.5 mg/dL — ABNORMAL LOW (ref 8.9–10.3)
Chloride: 100 mmol/L (ref 98–111)
Creatinine, Ser: 2.07 mg/dL — ABNORMAL HIGH (ref 0.44–1.00)
GFR, Estimated: 24 mL/min — ABNORMAL LOW (ref >60)
Glucose, Bld: 131 mg/dL — ABNORMAL HIGH (ref 70–99)
Potassium: 4.5 mmol/L (ref 3.5–5.1)
Sodium: 133 mmol/L — ABNORMAL LOW (ref 135–145)

## 2023-10-20 LAB — CREATININE, FLUID (PLEURAL, PERITONEAL, JP DRAINAGE): Creat, Fluid: 2.4 mg/dL

## 2023-10-20 MED ORDER — ENOXAPARIN SODIUM 30 MG/0.3ML IJ SOSY
30.0000 mg | PREFILLED_SYRINGE | INTRAMUSCULAR | Status: DC
  Administered 2023-10-21 – 2023-10-22 (×2): 30 mg via SUBCUTANEOUS
  Filled 2023-10-20 (×2): qty 0.3

## 2023-10-20 MED ORDER — SODIUM CHLORIDE 0.9 % IV SOLN: INTRAVENOUS | Status: DC

## 2023-10-20 MED ORDER — SODIUM CHLORIDE 0.9 % IV BOLUS
500.0000 mL | Freq: Once | INTRAVENOUS | Status: AC
  Administered 2023-10-20: 500 mL via INTRAVENOUS

## 2023-10-20 NOTE — Anesthesia Postprocedure Evaluation (Signed)
 Anesthesia Post Note  Patient: Donna [name]  Procedure(s) Performed: REPAIR, HERNIA, PARASTOMAL, ROBOT-ASSISTED (Abdomen) REPAIR, HERNIA, VENTRAL, ROBOT-ASSISTED (Abdomen)     Patient location during evaluation: PACU Anesthesia Type: General Level of consciousness: awake and alert Pain management: pain level controlled Vital Signs Assessment: post-procedure vital signs reviewed and stable Respiratory status: spontaneous breathing, nonlabored ventilation, respiratory function stable and patient connected to nasal cannula oxygen Cardiovascular status: blood pressure returned to baseline and stable Postop Assessment: no apparent nausea or vomiting Anesthetic complications: no   No notable events documented.  Last Vitals:  Vitals:   10/19/23 2200 10/20/23 0414  BP: (!) 119/52 (!) 116/50  Pulse: 78 74  Resp:  18  Temp:  37.1 C  SpO2:  99%    Last Pain:  Vitals:   10/20/23 0750  TempSrc:   PainSc: 3                  Debby FORBES Like

## 2023-10-20 NOTE — Progress Notes (Signed)
 Mobility Specialist - Progress Note   10/20/23 1500  Mobility  Activity Ambulated with assistance  Level of Assistance Minimal assist, patient does 75% or more  Assistive Device Front wheel walker  Distance Ambulated (ft) 60 ft  Range of Motion/Exercises Active  Activity Response Tolerated well  Mobility Referral Yes  Mobility visit 1 Mobility  Mobility Specialist Start Time (ACUTE ONLY) 1500  Mobility Specialist Stop Time (ACUTE ONLY) 1524  Mobility Specialist Time Calculation (min) (ACUTE ONLY) 24 min   Received in bed and after convincing agreed to mobility, multiple pauses due to pain. Returned to bed with all needs met. Alarm on.  Cyndee Ada Mobility Specialist

## 2023-10-20 NOTE — Progress Notes (Signed)
 2 Days Post-Op   Subjective/Chief Complaint: Having some pain, but started having flatus in ostomy bag.   Objective: Vital signs in last 24 hours: Temp:  [98.4 F (36.9 C)-98.8 F (37.1 C)] 98.8 F (37.1 C) (10/19 0414) Pulse Rate:  [71-78] 74 (10/19 0414) Resp:  [18] 18 (10/19 0414) BP: (106-119)/(50-53) 116/50 (10/19 0414) SpO2:  [96 %-99 %] 99 % (10/19 0414) Last BM Date :  (ileostomy)  Intake/Output from previous day: 10/18 0701 - 10/19 0700 In: 2819.1 [P.O.:1800; I.V.:719.1; IV Piggyback:100] Out: 1290 [Urine:850; Drains:330; Stool:110] Intake/Output this shift: Total I/O In: -  Out: 30 [Drains:30]  General nad Pulm effort normal Ab tender throughout as expected, mild distended. ileostomy pink with flatus in bag, incisions clean, drain serosang  Lab Results:  Recent Labs    10/19/23 0535  WBC 5.3  HGB 11.3*  HCT 34.2*  PLT 157   BMET Recent Labs    10/19/23 0535 10/20/23 0706  NA  --  133*  K 4.3 4.5  CL  --  100  CO2  --  23  GLUCOSE  --  131*  BUN  --  28*  CREATININE 1.35* 2.07*  CALCIUM  --  8.5*   PT/INR No results for input(s): LABPROT, INR in the last 72 hours. ABG No results for input(s): PHART, HCO3 in the last 72 hours.  Invalid input(s): PCO2, PO2  Studies/Results: No results found.  Anti-infectives: Anti-infectives (From admission, onward)    Start     Dose/Rate Route Frequency Ordered Stop   10/18/23 2200  cefoTEtan  (CEFOTAN ) 2 g in sodium chloride  0.9 % 100 mL IVPB       Note to Pharmacy: Pharmacy may adjust dosing strength, schedule, rate of infusion, etc as needed to optimize therapy (has CKD)   2 g 200 mL/hr over 30 Minutes Intravenous Every 24 hours 10/18/23 1848 10/23/23 2159   10/18/23 1930  ceFAZolin  (ANCEF ) IVPB 2g/100 mL premix        2 g 200 mL/hr over 30 Minutes Intravenous Every 8 hours 10/18/23 1848 10/18/23 2014   10/18/23 0845  ceFAZolin  (ANCEF ) IVPB 2g/100 mL premix       Placed in And Linked  Group   2 g 200 mL/hr over 30 Minutes Intravenous On call to O.R. 10/18/23 0839 10/18/23 1122   10/18/23 0845  metroNIDAZOLE  (FLAGYL ) IVPB 500 mg       Placed in And Linked Group   500 mg 100 mL/hr over 60 Minutes Intravenous On call to O.R. 10/18/23 0839 10/18/23 1146       Assessment/Plan: POD 2 TAR, ileostomy revision -Acute kidney injury - will do IV fluid bolus and change IV Fluids to NS.  Do not saline lock.  - Cr 2.07, definitely not baseline.  Will also send JP for creatinine.  Low suspicion of urine leak given appearance of drain output, but volume >300 mL - pt requesting soft diet, not unreasonable given flatus in ostomy appliance.   -pain control -oob, pulm toilet -home meds -lovenox  per Dr Sheldon Bern labs in AM.   Jina LITTIE Nephew, MD, FACS, FSSO Surgical Oncology, General Surgery, Trauma and Critical [facility], [place] [phone] for weekday/non holidays Check [email or web address] for coverage night/weekend/holidays

## 2023-10-21 ENCOUNTER — Encounter (HOSPITAL_COMMUNITY): Payer: Self-pay | Admitting: Surgery

## 2023-10-21 LAB — BASIC METABOLIC PANEL WITH GFR
Anion gap: 11 (ref 5–15)
BUN: 25 mg/dL — ABNORMAL HIGH (ref 8–23)
CO2: 24 mmol/L (ref 22–32)
Calcium: 9.1 mg/dL (ref 8.9–10.3)
Chloride: 104 mmol/L (ref 98–111)
Creatinine, Ser: 1.71 mg/dL — ABNORMAL HIGH (ref 0.44–1.00)
GFR, Estimated: 31 mL/min — ABNORMAL LOW (ref >60)
Glucose, Bld: 109 mg/dL — ABNORMAL HIGH (ref 70–99)
Potassium: 4.3 mmol/L (ref 3.5–5.1)
Sodium: 139 mmol/L (ref 135–145)

## 2023-10-21 LAB — CBC
HCT: 29.2 % — ABNORMAL LOW (ref 36.0–46.0)
Hemoglobin: 9.5 g/dL — ABNORMAL LOW (ref 12.0–15.0)
MCH: 29.4 pg (ref 26.0–34.0)
MCHC: 32.5 g/dL (ref 30.0–36.0)
MCV: 90.4 fL (ref 80.0–100.0)
Platelets: 178 K/uL (ref 150–400)
RBC: 3.23 MIL/uL — ABNORMAL LOW (ref 3.87–5.11)
RDW: 14.5 % (ref 11.5–15.5)
WBC: 9.6 K/uL (ref 4.0–10.5)
nRBC: 0 % (ref 0.0–0.2)

## 2023-10-21 MED ORDER — AMLODIPINE BESYLATE 5 MG PO TABS
5.0000 mg | ORAL_TABLET | Freq: Every day | ORAL | Status: DC
  Administered 2023-10-21 – 2023-10-25 (×5): 5 mg via ORAL
  Filled 2023-10-21 (×5): qty 1

## 2023-10-21 MED ORDER — POLYETHYLENE GLYCOL 3350 17 G PO PACK
17.0000 g | PACK | Freq: Once | ORAL | Status: AC
  Administered 2023-10-21: 17 g via ORAL
  Filled 2023-10-21: qty 1

## 2023-10-21 MED ORDER — CALCIUM POLYCARBOPHIL 625 MG PO TABS
625.0000 mg | ORAL_TABLET | Freq: Two times a day (BID) | ORAL | Status: DC
Start: 2023-10-21 — End: 2023-10-26
  Administered 2023-10-21 – 2023-10-26 (×11): 625 mg via ORAL
  Filled 2023-10-21 (×12): qty 1

## 2023-10-21 NOTE — Evaluation (Signed)
 Physical Therapy Evaluation Patient Details Name: Donna [name] MRN: [id number] DOB: 1945/01/06 Today's Date: 10/21/2023  History of Present Illness  78 yo female presents to therapy following hospitalization on 10/18/2023 due to para-ileostomy hernia. Pt underwent repair of hernia on 10/17.  Pt PMH includes but is not limited to: DOE, fatigue, colon ca s/p colectomy, OA, HTN, GERD, HLD and L TKA.  Clinical Impression      Pt admitted with above diagnosis.  Pt currently with functional limitations due to the deficits listed below (see PT Problem List). Pt in bed when PT arrived. Pt agreeable to therapy intervention. Visitor present. Pt required mod A for supine <> sit and guided through log roll technique due to recent abdominal surgery and pain report. Pt required CGA for sit to stand  from EOB x 2 and standard commode with min cues for proper UE and AD placement. Gait assessed in hallway with CGA and min cues with RW and pt requiring frequent standing therapeutic rest breaks due to abdominal pain, pt tolerated 75 feet and elected to return to bed due to abdominal pressure and pain. Nursing staff provided pt with mm relaxer at ed of eval. Pt left in bed, all needs in place. Pt will benefit from acute skilled PT to increase their independence and safety with mobility to allow discharge.       If plan is discharge home, recommend the following: A little help with walking and/or transfers;A little help with bathing/dressing/bathroom;Assistance with cooking/housework;Assist for transportation;Help with stairs or ramp for entrance   Can travel by private vehicle        Equipment Recommendations None recommended by PT  Recommendations for Other Services       Functional Status Assessment Patient has had a recent decline in their functional status and demonstrates the ability to make significant improvements in function in a reasonable and predictable amount of time.     Precautions /  Restrictions Precautions Precautions: Fall Recall of Precautions/Restrictions: Intact Precaution/Restrictions Comments: ileostomy bag, drain Restrictions Weight Bearing Restrictions Per Provider Order: No      Mobility  Bed Mobility Overal bed mobility: Needs Assistance Bed Mobility: Sit to Supine, Supine to Sit     Supine to sit: Mod assist, HOB elevated, Used rails Sit to supine: Used rails, Mod assist   General bed mobility comments: pt guided through log roll technique with HOB sligtly elevated with supine to sit min A for B LE and mod A for trunk, bed flat for sit to supine mod A for B LE to return to bed    Transfers Overall transfer level: Needs assistance Equipment used: Rolling walker (2 wheels) Transfers: Sit to/from Stand Sit to Stand: Contact guard assist           General transfer comment: min cues and CGA for sit to stand from EOB x 2 and commode    Ambulation/Gait Ambulation/Gait assistance: Contact guard assist Gait Distance (Feet): 75 Feet Assistive device: Rolling walker (2 wheels) Gait Pattern/deviations: Step-through pattern, Trunk flexed Gait velocity: decreased     General Gait Details: slight trunk flexion due to abdominal pain, pt required frequent standing rest breaks secondary to grabbing like abominal pain min cues for safety and RW management  Stairs            Wheelchair Mobility     Tilt Bed    Modified Rankin (Stroke Patients Only)       Balance Overall balance assessment: Needs assistance Sitting-balance support: No upper  extremity supported, Feet supported Sitting balance-Leahy Scale: Good     Standing balance support: Single extremity supported, During functional activity Standing balance-Leahy Scale: Fair Standing balance comment: static standing no UE support                             Pertinent Vitals/Pain Pain Assessment Pain Assessment: 0-10 Pain Score: 8  Pain Location: abdomen Pain  Descriptors / Indicators: Aching, Constant, Discomfort, Grimacing, Guarding, Spasm (grabbing) Pain Intervention(s): Limited activity within patient's tolerance, Premedicated before session, Monitored during session, Repositioned, Ice applied (mm relaxer provided at end of eval)    Home Living Family/patient expects to be discharged to:: Private residence Living Arrangements: Children Available Help at Discharge: Family;Available 24 hours/day Type of Home: House Home Access: Stairs to enter Entrance Stairs-Rails: Can reach both Entrance Stairs-Number of Steps: 6   Home Layout: One level Home Equipment: Agricultural consultant (2 wheels);Cane - single point;BSC/3in1;Shower seat;Grab bars - tub/shower Additional Comments: Pt lives with son who works, has other family close by that is going to try to make sure she has close to 24/7 assistance    Prior Function Prior Level of Function : Independent/Modified Independent             Mobility Comments: ind without AD, RW occasionally       Extremity/Trunk Assessment   Upper Extremity Assessment Upper Extremity Assessment: Defer to OT evaluation    Lower Extremity Assessment Lower Extremity Assessment: Overall WFL for tasks assessed    Cervical / Trunk Assessment Cervical / Trunk Assessment: Normal  Communication   Communication Communication: No apparent difficulties    Cognition Arousal: Alert Behavior During Therapy: WFL for tasks assessed/performed   PT - Cognitive impairments: No apparent impairments                         Following commands: Intact       Cueing Cueing Techniques: Verbal cues     General Comments      Exercises     Assessment/Plan    PT Assessment Patient needs continued PT services  PT Problem List Decreased activity tolerance;Decreased balance;Decreased mobility;Pain       PT Treatment Interventions DME instruction;Gait training;Stair training;Functional mobility  training;Therapeutic activities;Therapeutic exercise;Balance training;Neuromuscular re-education;Patient/family education    PT Goals (Current goals can be found in the Care Plan section)  Acute Rehab PT Goals Patient Stated Goal: to be able to get stronger get rid of the pain and go home PT Goal Formulation: With patient Time For Goal Achievement: 11/04/23 Potential to Achieve Goals: Good    Frequency Min 3X/week     Co-evaluation               AM-PAC PT 6 Clicks Mobility  Outcome Measure Help needed turning from your back to your side while in a flat bed without using bedrails?: A Little Help needed moving from lying on your back to sitting on the side of a flat bed without using bedrails?: A Lot Help needed moving to and from a bed to a chair (including a wheelchair)?: A Little Help needed standing up from a chair using your arms (e.g., wheelchair or bedside chair)?: A Little Help needed to walk in hospital room?: A Little Help needed climbing 3-5 steps with a railing? : A Lot 6 Click Score: 16    End of Session Equipment Utilized During Treatment: Gait belt Activity Tolerance: Patient tolerated  treatment well;No increased pain Patient left: in bed;with call bell/phone within reach;with bed alarm set;with nursing/sitter in room;with family/visitor present Nurse Communication: Mobility status PT Visit Diagnosis: Unsteadiness on feet (R26.81);Pain Pain - part of body:  (abdomen)    Time: 8662-8596 PT Time Calculation (min) (ACUTE ONLY): 26 min   Charges:   PT Evaluation $PT Eval Low Complexity: 1 Low PT Treatments $Gait Training: 8-22 mins PT General Charges $$ ACUTE PT VISIT: 1 Visit         [place], PT Acute Rehab   [place] VEAR Drone 10/21/2023, 3:53 PM

## 2023-10-21 NOTE — Progress Notes (Signed)
 Nutrition Education Note  RD consulted for nutrition education regarding a diet status post ileostomy.  RD provided Ileostomy Nutrition Therapy handout from the [employer]. Encouraged patient to keep fiber intake below 8 grams during transition from liquids to solids, and below 13 grams per day as symptoms subside. Encouraged patient to consume refined and white grain foods with less than 2g of fiber per serving as well as soft, well-cooked proteins, and well cooked vegetables without seeds or skin.  RD discussed why it is important to adhere to list of recommended foods, and foods to avoid, to maintain ostomy integrity and decrease risk of gas, diarrhea, and blockage related complications. Encouraged patient to consume 8 to 10 cups of liquid per day and to drink liquids 30 minutes after meals or snacks to prevent flushing.   Provided tips to ensure adequate absorption of medications, vitamins, and minerals. After 6 weeks, as patient's diet returns to normal, encouraged adding 1 new food in per day, for a few days to monitor tolerance.  Expect fair compliance  Body mass index is 30.82 kg/m. Pt meets criteria for obesity based on current BMI.   Current diet order is Regular, patient is consuming approximately 100% of meals at this time. Labs and medications reviewed. No further nutrition interventions warranted at this time. If additional nutrition issues arise, please re-consult RD.  Morna Lee, MS, RD, LDN Inpatient Clinical Dietitian Contact via Secure chat

## 2023-10-21 NOTE — Evaluation (Signed)
 Occupational Therapy Evaluation Patient Details Name: Donna [name] MRN: [id number] DOB: November 24, 1945 Today's Date: 10/21/2023   History of Present Illness   78 yo female presents to therapy following hospitalization on 10/18/2023 due to para-ileostomy hernia. Pt underwent repair of hernia on 10/17.  Pt PMH includes but is not limited to: DOE, fatigue, colon ca s/p colectomy, OA, HTN, GERD, HLD and L TKA.     Clinical Impressions Pt c/o of significant pain to abdomen, RN gave meds during session. Pt lives with son, who works during the day, has other family that is planning to assist close to 24/7 if needed. PLOF independent, occasional use of RW. Pt currently requires RW for ambulation, min A for STS due to pain, CGA ambulating to restroom. Pt set up/supervision for UB dressing/bathing, will need max A for LB ADLs. Pt would benefit from continued acute OT to maximize functional strength and independence and return to PLOF, HHOT recommended. Pt has RW, BSC, shower seat at home, no further DME needs.      If plan is discharge home, recommend the following:   A little help with walking and/or transfers;A little help with bathing/dressing/bathroom;Assistance with cooking/housework;Assist for transportation;Help with stairs or ramp for entrance     Functional Status Assessment   Patient has had a recent decline in their functional status and demonstrates the ability to make significant improvements in function in a reasonable and predictable amount of time.     Equipment Recommendations   None recommended by OT     Recommendations for Other Services         Precautions/Restrictions   Precautions Precautions: Fall Recall of Precautions/Restrictions: Intact Precaution/Restrictions Comments: ileostomy bag, drain Restrictions Weight Bearing Restrictions Per Provider Order: No     Mobility Bed Mobility Overal bed mobility: Needs Assistance Bed Mobility: Sit to  Supine       Sit to supine: Min assist, HOB elevated, Used rails   General bed mobility comments: Pt min A for BLEs to assist back to bed    Transfers Overall transfer level: Needs assistance Equipment used: Rolling walker (2 wheels) Transfers: Sit to/from Stand, Bed to chair/wheelchair/BSC Sit to Stand: Min assist     Step pivot transfers: Contact guard assist     General transfer comment: min A to power STS, CGA for ambualtion with RW      Balance Overall balance assessment: Needs assistance Sitting-balance support: No upper extremity supported, Feet supported Sitting balance-Leahy Scale: Good     Standing balance support: Single extremity supported, During functional activity Standing balance-Leahy Scale: Fair Standing balance comment: able to statically stand unsupported, RW required for dynamic balance                           ADL either performed or assessed with clinical judgement   ADL Overall ADL's : Needs assistance/impaired Eating/Feeding: Independent   Grooming: Sitting;Standing;Contact guard assist;Set up   Upper Body Bathing: Set up;Sitting   Lower Body Bathing: Maximal assistance;Sitting/lateral leans;Sit to/from stand   Upper Body Dressing : Set up;Sitting   Lower Body Dressing: Maximal assistance;Sitting/lateral leans;Sit to/from stand   Toilet Transfer: Minimal assistance;Rolling walker (2 wheels)   Toileting- Clothing Manipulation and Hygiene: Set up;Supervision/safety;Sitting/lateral lean;Sit to/from stand       Functional mobility during ADLs: Contact guard assist;Rolling walker (2 wheels) General ADL Comments: Pt CGA for mobility with RW, increased time due to pain in abdomen. Pt requires max A for LB ADLs  due to pain, set up for UB ADLs.     Vision Baseline Vision/History: 1 Wears glasses Ability to See in Adequate Light: 0 Adequate Patient Visual Report: No change from baseline       Perception         Praxis          Pertinent Vitals/Pain Pain Assessment Pain Assessment: 0-10 Pain Score: 10-Worst pain ever Pain Location: abdomen Pain Descriptors / Indicators: Aching, Constant, Discomfort, Grimacing Pain Intervention(s): Monitored during session     Extremity/Trunk Assessment Upper Extremity Assessment Upper Extremity Assessment: Overall WFL for tasks assessed           Communication Communication Communication: No apparent difficulties   Cognition Arousal: Alert Behavior During Therapy: WFL for tasks assessed/performed Cognition: No apparent impairments                               Following commands: Intact       Cueing  General Comments   Cueing Techniques: Verbal cues      Exercises     Shoulder Instructions      Home Living Family/patient expects to be discharged to:: Private residence Living Arrangements: Children Available Help at Discharge: Family;Available 24 hours/day Type of Home: House Home Access: Stairs to enter [employer] of Steps: 6 Entrance Stairs-Rails: Can reach both Home Layout: One level     Bathroom Shower/Tub: Walk-in shower         Home Equipment: Agricultural consultant (2 wheels);Cane - single point;BSC/3in1;Shower seat;Grab bars - tub/shower   Additional Comments: Pt lives with son who works, has other family close by that is going to try to make sure she has close to 24/7 assistance      Prior Functioning/Environment Prior Level of Function : Independent/Modified Independent             Mobility Comments: ind without AD, RW occasionally      OT Problem List: Decreased strength;Decreased range of motion;Decreased activity tolerance;Impaired balance (sitting and/or standing);Pain   OT Treatment/Interventions: Self-care/ADL training;Therapeutic exercise;Energy conservation;DME and/or AE instruction;Therapeutic activities;Patient/family education      OT Goals(Current goals can be found in the care plan  section)   Acute Rehab OT Goals Patient Stated Goal: to return home OT Goal Formulation: With patient/family Time For Goal Achievement: 11/04/23 Potential to Achieve Goals: Good   OT Frequency:  Min 2X/week    Co-evaluation              AM-PAC OT 6 Clicks Daily Activity     Outcome Measure Help from another person eating meals?: None Help from another person taking care of personal grooming?: A Little Help from another person toileting, which includes using toliet, bedpan, or urinal?: A Little Help from another person bathing (including washing, rinsing, drying)?: A Lot Help from another person to put on and taking off regular upper body clothing?: A Little Help from another person to put on and taking off regular lower body clothing?: A Lot 6 Click Score: 17   End of Session Equipment Utilized During Treatment: Gait belt;Rolling walker (2 wheels) Nurse Communication: Mobility status  Activity Tolerance: Patient limited by pain Patient left: in bed;with call bell/phone within reach;with family/visitor present  OT Visit Diagnosis: Unsteadiness on feet (R26.81);Other abnormalities of gait and mobility (R26.89);Muscle weakness (generalized) (M62.81);Pain Pain - part of body:  (abdomen)  Time: 8956-8891 OT Time Calculation (min): 25 min Charges:  OT General Charges $OT Visit: 1 Visit OT Evaluation $OT Eval Low Complexity: 1 Low OT Treatments $Self Care/Home Management : 8-22 mins  [place], OTR/L   Elouise JONELLE Bott 10/21/2023, 11:11 AM

## 2023-10-21 NOTE — Consult Note (Addendum)
 WOC Nurse ostomy consult note Pt is familiar with pouch application and emptying since she had an ileostomy prior to surgery.  Stoma size and appearance changed changed post-op.   Stoma is 1 1/2 inches, mod amt clotted blood surrounding, flush with skin level.  Pt states before, the stoma was able skin level. No stool or flatus at this time.  Pt was using a flat one piece from [employer] prior to admission. She can use the same supplies after discharge.   Current pouch was leaking behind the barrier. Added a barrier ring to attempt to maintain a seal and patent was able to stretch and apply to the back of the bag.  Discussed that opening will need to be cut larger to 1 1/2 inches, then her previous pattern which was 1 1/4.  5 extra sets of each supply left at the bedside for staff nurses' use.  Use supplies: barrier ring [employer] # [phone] and one piece pouch [employer] # [id number] Reviewed pouching routines with patient.  She states she is independent with pouch application, emptying and ordering supplies prior to admission.   WOC team will check on weekly while she is in the hospital. Thank-you,  Stephane Fought MSN, RN, CWOCN, CWCN-AP, CNS Contact Mon-Fri 0700-1500: [phone]

## 2023-10-21 NOTE — Progress Notes (Signed)
 10/21/2023  Donna [name] [id number] [birth year]  CARE TEAM: PCP: Clarice Nottingham, MD  Outpatient Care Team: Patient Care Team: Clarice Nottingham, MD as PCP - General (Internal Medicine) Elmira Newman PARAS, MD as PCP - Cardiology (Cardiology) Teresa Lonni HERO, MD as Consulting Physician (Colon and Rectal Surgery) Sheldon Standing, MD as Consulting Physician (General Surgery) Donnald Charleston, MD as Consulting Physician (Gastroenterology) Elmira Newman PARAS, MD as Consulting Physician (Cardiology)  Inpatient Treatment Team: Treatment Team:  Sheldon Standing, MD Massie Delaine SAUNDERS, RN Bullins, Powell PARAS, RN Cesario Mylinda KIDD, RN McGirr, Mliss BRAVO, NT Kusnitz, Devere POUR, RN Perri Tinnie LABOR, NT Carolee Rosaline DASEN, RPH Estelle Hunter DEL, RN   Problem List:   Principal Problem:   Para-ileostomy hernia [facility]) Active Problems:   S/P total colectomy   Recurrent incisional hernia with incarceration   Prediabetes   Obstructive sleep apnea syndrome   History of ulcerative colitis   History of malignant neoplasm of colon   Stage 3b chronic kidney disease (HCC)   Ulcerative colitis (HCC)   10/18/2023  POST-OPERATIVE DIAGNOSIS:  PARASTOMAL HERNIA AND VENTRAL INCISIONAL RECURRENT AND INCARCERATED ABDOMINAL WALL HERNIAE  Dimensions of hernia post-op:  16cm x 8cm   PROCEDURE:   ROBOTIC COMPONENT SEPARATION (transversus abdominis release = TAR) on right Repair of incarcerated recurrent incisional and parastomal hernias with mesh ROBOTIC LYSIS OF ADHESIONS SEROSAL REPAIR x2 END ILEOSTOMY REVISION TAP BLOCK - BILATERAL   SURGEON:  Standing KYM Sheldon, MD  OR FINDINGS:  Moderately dense adhesions small bowel to anterior abdominal wall and periumbilical midline hernia.  Swiss cheese hernias extending up to the subxiphoid region.  15 cm in length.  Moderate parastomal hernia around end ileostomy with a foot of small bowel densely adherent to the hernia sac.  Serosal repairs down x 2.   Ileostomy revision done for last 7 cm given poor tissues.   Type of ventral wall repair:  Laparoscopic underlay repair .with Primary repair of largest hernia Placement of mesh: Right lateral and flank retrorectus preperitoneal.  Midline intraperitoneal. Name of mesh: Bard Ventralight dual sided (polypropylene / Seprafilm) Size of mesh: 33x27cm Orientation: Transverse Mesh overlap:  7cm    Assessment [facility] Stay = 3 days) 3 Days Post-Op    Okay with slowly resolving ileus.    Plan:  Encourage p.o. intake.  Tolerating soft diet.  Advance to regular diet.  Bowel regimen.  Since she tends to be a little bit constant right now we will do psyllium only at this time.  Follow off IV fluids to see if creatinine can stay stable on its own.  IV fluid backup just in case.  Continue drain in abdominal wall to decrease seroma formation.  Drain creatinine same as serum reassuring.  Multimodal pain control.  -monitor electrolytes & replace as needed  Keep K>4, Mg>2, Phos>3  -VTE prophylaxis- SCDs.  Anticoagulation prophyllaxis SQ as appropriate  -mobilize as tolerated to help recovery.  Enlist therapies in moderate/high risk patients as appropriate  I updated the patient's status to the patient  Recommendations were made.  Questions were answered.  She expressed understanding & appreciation.  -Disposition:  Disposition:  The patient is from: Home Anticipate discharge to:  Home with Home Health Anticipated Date of Discharge is:  October 21,2025   Barriers to discharge:  Pending Clinical improvement (more likely than not), Therapy assessment & Recommendations pending, and Consultant clearance & sign off    Patient currently is NOT MEDICALLY STABLE for discharge from the hospital from a  surgery standpoint.      I reviewed nursing notes, last 24 h vitals and pain scores, last 48 h intake and output, last 24 h labs and trends, and last 24 h imaging results.  I have reviewed this  patient's available data, including medical history, events of note, test results, etc as part of my evaluation.   A significant portion of that time was spent in counseling. Care during the described time interval was provided by me.  This care required moderate level of medical decision making.  10/21/2023    Subjective: (Chief complaint)  Patient feeling less sore.  Not much of an appetite but trying to have some soft diet and liquids.  Objective:  Vital signs:  Vitals:   10/20/23 0414 10/20/23 1332 10/20/23 2119 10/21/23 0446  BP: (!) 116/50 (!) 112/46 (!) 115/58 (!) 141/83  Pulse: 74 76 83 89  Resp: 18 16 19 17   Temp: 98.8 F (37.1 C) 98.3 F (36.8 C) 99.2 F (37.3 C) 99.1 F (37.3 C)  TempSrc: Oral Oral Oral Oral  SpO2: 99% 94% 94% 97%  Weight:      Height:        Last BM Date :  (ileostomy)  Intake/Output   Yesterday:  10/19 0701 - 10/20 0700 In: 1405 [P.O.:360; I.V.:945; IV Piggyback:100] Out: 1855 [Urine:1700; Drains:130; Stool:25] This shift:  No intake/output data recorded.  Bowel function:  Flatus: YES  BM:  No  Drain: Serous   Physical Exam:  General: Pt awake/alert in no acute distress Eyes: PERRL, normal EOM.  Sclera clear.  No icterus Neuro: CN II-XII intact w/o focal sensory/motor deficits. Lymph: No head/neck/groin lymphadenopathy Psych:  No delerium/psychosis/paranoia.  Oriented x 4 HENT: Normocephalic, Mucus membranes moist.  No thrush Neck: Supple, No tracheal deviation.  No obvious thyromegaly Chest: No pain to chest wall compression.  Good respiratory excursion.  No audible wheezing CV:  Pulses intact.  Regular rhythm.  No major extremity edema MS: Normal AROM mjr joints.  No obvious deformity  Abdomen: Soft.  Nondistended.  Mildly tender at incisions only.  No evidence of peritonitis.  No incarcerated hernias. Ileostomy in place with scant flatus in bag.  Mucosa pink/viable.  Ext:   No deformity.  No mjr edema.  No  cyanosis Skin: No petechiae / purpurea.  No major sores.  Warm and dry    Results:   Cultures: No results found for this or any previous visit (from the past 720 hours).  Labs: Results for orders placed or performed during the hospital encounter of 10/18/23 (from the past 48 hours)  Basic metabolic panel     Status: Abnormal   Collection Time: 10/20/23  7:06 AM  Result Value Ref Range   Sodium 133 (L) 135 - 145 mmol/L   Potassium 4.5 3.5 - 5.1 mmol/L   Chloride 100 98 - 111 mmol/L   CO2 23 22 - 32 mmol/L   Glucose, Bld 131 (H) 70 - 99 mg/dL    Comment: Glucose reference range applies only to samples taken after fasting for at least 8 hours.   BUN 28 (H) 8 - 23 mg/dL   Creatinine, Ser 7.92 (H) 0.44 - 1.00 mg/dL   Calcium 8.5 (L) 8.9 - 10.3 mg/dL   GFR, Estimated 24 (L) >60 mL/min    Comment: (NOTE) Calculated using the CKD-EPI Creatinine Equation (2021)    Anion gap 11 5 - 15    Comment: Performed at [facility], [address]  [place], [place] [address]  Creatinine, fluid (JP Drainage)     Status: None   Collection Time: 10/20/23 11:52 AM  Result Value Ref Range   Creat, Fluid 2.4 mg/dL    Comment: (NOTE) No normal range established for this test Results should be evaluated in conjunction with serum values Performed at [facility], [address] [place], [place] [address]    Fluid Type-FCRE JP DRAINAGE     Comment: Performed at [facility], [address] [place], [place] [address]  CBC     Status: Abnormal   Collection Time: 10/21/23  4:27 AM  Result Value Ref Range   WBC 9.6 4.0 - 10.5 K/uL   RBC 3.23 (L) 3.87 - 5.11 MIL/uL   Hemoglobin 9.5 (L) 12.0 - 15.0 g/dL   HCT 70.7 (L) 63.9 - 53.9 %   MCV 90.4 80.0 - 100.0 fL   MCH 29.4 26.0 - 34.0 pg   MCHC 32.5 30.0 - 36.0 g/dL   RDW 85.4 88.4 - 84.4 %   Platelets 178 150 - 400 K/uL   nRBC 0.0 0.0 - 0.2 %    Comment: Performed at [facility], [address] [place], [place] [address]  Basic metabolic panel     Status: Abnormal   Collection Time: 10/21/23  4:27 AM  Result Value Ref Range   Sodium 139 135 - 145 mmol/L   Potassium 4.3 3.5 - 5.1 mmol/L   Chloride 104 98 - 111 mmol/L   CO2 24 22 - 32 mmol/L   Glucose, Bld 109 (H) 70 - 99 mg/dL    Comment: Glucose reference range applies only to samples taken after fasting for at least 8 hours.   BUN 25 (H) 8 - 23 mg/dL   Creatinine, Ser 8.28 (H) 0.44 - 1.00 mg/dL   Calcium 9.1 8.9 - 89.6 mg/dL   GFR, Estimated 31 (L) >60 mL/min    Comment: (NOTE) Calculated using the CKD-EPI Creatinine Equation (2021)    Anion gap 11 5 - 15    Comment: Performed at [facility], [address] [place], [place] [address]    Imaging / Studies: No results found.  Medications / Allergies: per chart  Antibiotics: Anti-infectives (From admission, onward)    Start     Dose/Rate Route Frequency Ordered Stop   10/18/23 2200  cefoTEtan  (CEFOTAN ) 2 g in sodium chloride  0.9 % 100 mL IVPB  Status:  Discontinued       Note to Pharmacy: Pharmacy may adjust dosing strength, schedule, rate of infusion, etc as needed to optimize therapy (has CKD)   2 g 200 mL/hr over 30 Minutes Intravenous Every 24 hours 10/18/23 1848 10/21/23 0720   10/18/23 1930  ceFAZolin  (ANCEF ) IVPB 2g/100 mL premix        2 g 200 mL/hr over 30 Minutes Intravenous Every 8 hours 10/18/23 1848 10/18/23 2014   10/18/23 0845  ceFAZolin  (ANCEF ) IVPB 2g/100 mL premix       Placed in And Linked Group   2 g 200 mL/hr over 30 Minutes Intravenous On call to O.R. 10/18/23 0839 10/18/23 1122   10/18/23 0845  metroNIDAZOLE  (FLAGYL ) IVPB 500 mg       Placed in And Linked Group   500 mg 100 mL/hr over 60 Minutes Intravenous On call to O.R. 10/18/23 0839 10/18/23 1146         Note: Portions of this report may have been transcribed using voice recognition  software. Every effort was made to ensure accuracy; however,  inadvertent computerized transcription errors may be present.   Any transcriptional errors that result from this process are unintentional.    Elspeth KYM Schultze, MD, FACS, MASCRS Esophageal, Gastrointestinal & Colorectal Surgery Robotic and Minimally Invasive Surgery  [facility] [address] [place], [place] [address] [phone] Fax [phone] Main  CONTACT INFORMATION: Weekday (9AM-5PM): Call [facility] main office at [phone] Weeknight (5PM-9AM) or Weekend/Holiday: Check EPIC Web Links tab & use AMION (password  [email or web address]) for General Surgery [facility] coverage  Please, DO NOT use SecureChat  (it is not reliable communication to reach operating surgeons & will lead to a delay in care).   Epic staff messaging available for outpatient concerns needing 1-2 business day response.      10/21/2023  7:22 AM

## 2023-10-22 ENCOUNTER — Encounter (HOSPITAL_COMMUNITY): Payer: Self-pay | Admitting: Surgery

## 2023-10-22 LAB — CBC
HCT: 26.5 % — ABNORMAL LOW (ref 36.0–46.0)
Hemoglobin: 8.6 g/dL — ABNORMAL LOW (ref 12.0–15.0)
MCH: 30.6 pg (ref 26.0–34.0)
MCHC: 32.5 g/dL (ref 30.0–36.0)
MCV: 94.3 fL (ref 80.0–100.0)
Platelets: 176 K/uL (ref 150–400)
RBC: 2.81 MIL/uL — ABNORMAL LOW (ref 3.87–5.11)
RDW: 14.5 % (ref 11.5–15.5)
WBC: 6.7 K/uL (ref 4.0–10.5)
nRBC: 0 % (ref 0.0–0.2)

## 2023-10-22 LAB — BASIC METABOLIC PANEL WITH GFR
Anion gap: 10 (ref 5–15)
BUN: 30 mg/dL — ABNORMAL HIGH (ref 8–23)
CO2: 22 mmol/L (ref 22–32)
Calcium: 9 mg/dL (ref 8.9–10.3)
Chloride: 103 mmol/L (ref 98–111)
Creatinine, Ser: 1.58 mg/dL — ABNORMAL HIGH (ref 0.44–1.00)
GFR, Estimated: 34 mL/min — ABNORMAL LOW (ref >60)
Glucose, Bld: 101 mg/dL — ABNORMAL HIGH (ref 70–99)
Potassium: 4 mmol/L (ref 3.5–5.1)
Sodium: 135 mmol/L (ref 135–145)

## 2023-10-22 LAB — MAGNESIUM: Magnesium: 2.3 mg/dL (ref 1.7–2.4)

## 2023-10-22 LAB — SURGICAL PATHOLOGY

## 2023-10-22 LAB — PHOSPHORUS: Phosphorus: 2.4 mg/dL — ABNORMAL LOW (ref 2.5–4.6)

## 2023-10-22 LAB — PREALBUMIN: Prealbumin: 13 mg/dL — ABNORMAL LOW (ref 18–38)

## 2023-10-22 MED ORDER — LACTATED RINGERS IV BOLUS: 1000.0000 mL | Freq: Three times a day (TID) | INTRAVENOUS | Status: AC | PRN

## 2023-10-22 MED ORDER — POLYETHYLENE GLYCOL 3350 17 G PO PACK
34.0000 g | PACK | Freq: Once | ORAL | Status: AC
  Administered 2023-10-22: 34 g via ORAL
  Filled 2023-10-22: qty 2

## 2023-10-22 MED ORDER — SENNOSIDES-DOCUSATE SODIUM 8.6-50 MG PO TABS
1.0000 | ORAL_TABLET | Freq: Once | ORAL | Status: AC
  Administered 2023-10-22: 1 via ORAL
  Filled 2023-10-22: qty 1

## 2023-10-22 NOTE — Progress Notes (Signed)
 Mobility Specialist Progress Note:   10/22/23 1130  Mobility  Activity Ambulated with assistance  Level of Assistance Contact guard assist, steadying assist  Assistive Device Front wheel walker  Distance Ambulated (ft) 70 ft  Activity Response Tolerated fair  Mobility Referral Yes  Mobility visit 1 Mobility  Mobility Specialist Start Time (ACUTE ONLY) 1110  Mobility Specialist Stop Time (ACUTE ONLY) 1128  Mobility Specialist Time Calculation (min) (ACUTE ONLY) 18 min   Pt was received in bed and agreed to mobility. Pt stated pain in the abdomen towards the end of session but continued ambulation. Returned back to bed with all needs met. Call bell in reach. Left in room with family.  [employer] - Mobility Specialist

## 2023-10-22 NOTE — Progress Notes (Addendum)
 10/22/2023  Donna [name] [id number] [birth year]  CARE TEAM: PCP: Clarice Nottingham, MD  Outpatient Care Team: Patient Care Team: Clarice Nottingham, MD as PCP - General (Internal Medicine) Elmira Newman PARAS, MD as PCP - Cardiology (Cardiology) Teresa Lonni HERO, MD as Consulting Physician (Colon and Rectal Surgery) Sheldon Standing, MD as Consulting Physician (General Surgery) Donnald Charleston, MD as Consulting Physician (Gastroenterology) Elmira Newman PARAS, MD as Consulting Physician (Cardiology)  Inpatient Treatment Team: Treatment Team:  Sheldon Standing, MD Massie Delaine SAUNDERS, RN Bullins, Powell PARAS, RN Perri Tinnie LABOR, NT Beatrice Cathlean HERO, RN Carolee Rosaline DASEN, RPH Estelle Hunter DEL, RN Arlyss Si DASEN, NT Cloud, Ole SAILOR   Problem List:   Principal Problem:   Para-ileostomy hernia [facility]) Active Problems:   S/P total colectomy   Recurrent incisional hernia with incarceration   Prediabetes   Obstructive sleep apnea syndrome   History of ulcerative colitis   History of malignant neoplasm of colon   Stage 3b chronic kidney disease (HCC)   Ulcerative colitis (HCC)   10/18/2023  POST-OPERATIVE DIAGNOSIS:  PARASTOMAL HERNIA AND VENTRAL INCISIONAL RECURRENT AND INCARCERATED ABDOMINAL WALL HERNIAE  Dimensions of hernia post-op:  16cm x 8cm   PROCEDURE:   ROBOTIC COMPONENT SEPARATION (transversus abdominis release = TAR) on right Repair of incarcerated recurrent incisional and parastomal hernias with mesh ROBOTIC LYSIS OF ADHESIONS SEROSAL REPAIR x2 END ILEOSTOMY REVISION TAP BLOCK - BILATERAL   SURGEON:  Standing KYM Sheldon, MD  OR FINDINGS:  Moderately dense adhesions small bowel to anterior abdominal wall and periumbilical midline hernia.  Swiss cheese hernias extending up to the subxiphoid region.  15 cm in length.  Moderate parastomal hernia around end ileostomy with a foot of small bowel densely adherent to the hernia sac.  Serosal repairs down x 2.  Ileostomy  revision done for last 7 cm given poor tissues.   Type of ventral wall repair:  Laparoscopic underlay repair .with Primary repair of largest hernia Placement of mesh: Right lateral and flank retrorectus preperitoneal.  Midline intraperitoneal. Name of mesh: Bard Ventralight dual sided (polypropylene / Seprafilm) Size of mesh: 33x27cm Orientation: Transverse Mesh overlap:  7cm    Assessment Donna [name] Stay = 4 days) 4 Days Post-Op    Okay with slowly resolving ileus.    Plan:  Try regular diet.  I suspect she has some edema and swelling which making the ileostomy not empty great yet.   -Continue soluble fiber.  Will do the FiberCon since she prefers the pill form. -Will try and jumpstart things with some MiraLAX  and Senokot on top of her soluble fiber.   -She was wondering if she should attempt taking her diarrhea pill which I believe is loperamide .  I think that would backfire.  Hold off for now.   - If she feels a lot worse, may need NG tube and bowel rest.  Try and hold off  Follow off IV fluids to see if creatinine can stay stable on its own.  Try and keep her on the dry side to avoid ileus or edema.  IV fluid backup just in case.  Continue drain in abdominal wall to decrease seroma formation.  Drain creatinine same as serum reassuring.  Most likely removed prior to discharge  Multimodal pain control.  -monitor electrolytes & replace as needed  Keep K>4, Mg>2, Phos>3  - GERD.  Continue Pepcid H2 blocker  Sleep apnea.  Oxygen nightly as needed.  -VTE prophylaxis- SCDs.  Anticoagulation prophyllaxis SQ as appropriate  -mobilize  as tolerated to help recovery.  Enlist therapies in moderate/high risk patients as appropriate  I updated the patient's status to the patient  Recommendations were made.  Questions were answered.  She expressed understanding & appreciation.  -Disposition:  Disposition:  The patient is from: Home Anticipate discharge to:  Home with Home  Health Anticipated Date of Discharge is:  October 23,2025   Barriers to discharge:  Pending Clinical improvement (more likely than not), Therapy assessment & Recommendations pending, and Consultant clearance & sign off    Patient currently is NOT MEDICALLY STABLE for discharge from the [name] from a surgery standpoint.      I reviewed nursing notes, last 24 h vitals and pain scores, last 48 h intake and output, last 24 h labs and trends, and last 24 h imaging results.  I have reviewed this patient's available data, including medical history, events of note, test results, etc as part of my evaluation.   A significant portion of that time was spent in counseling. Care during the described time interval was provided by me.  This care required moderate level of medical decision making.  10/22/2023    Subjective: (Chief complaint)  No major events.  Occasionally feeling bloating.  Trying to walk.  Nursing team in room.  Denies much discomfort  Objective:  Vital signs:  Vitals:   10/21/23 2149 10/22/23 0550 10/22/23 0800 10/22/23 0805  BP: (!) 119/56 (!) 110/52 (!) 131/50 (!) 131/50  Pulse: 78 72 77 77  Resp: 15 14  14   Temp: 99.2 F (37.3 C) 98.3 F (36.8 C)  98.7 F (37.1 C)  TempSrc: Oral Oral    SpO2: 96% 94%  93%  Weight:      Height:        Last BM Date :  (ileostomy)  Intake/Output   Yesterday:  10/20 0701 - 10/21 0700 In: 1380 [P.O.:1380] Out: 1930 [Urine:1750; Drains:180] This shift:  Total I/O In: 3 [I.V.:3] Out: -   Bowel function:  Flatus: YES  BM:  No  Drain: Serous   Physical Exam:  General: Pt awake/alert in no acute distress.  Calm.  Relaxed.  Bright.  Alert. Eyes: PERRL, normal EOM.  Sclera clear.  No icterus Neuro: CN II-XII intact w/o focal sensory/motor deficits. Lymph: No head/neck/groin lymphadenopathy Psych:  No delerium/psychosis/paranoia.  Oriented x 4 HENT: Normocephalic, Mucus membranes moist.  No thrush Neck:  Supple, No tracheal deviation.  No obvious thyromegaly Chest: No pain to chest wall compression.  Good respiratory excursion.  No audible wheezing CV:  Pulses intact.  Regular rhythm.  No major extremity edema MS: Normal AROM mjr joints.  No obvious deformity  Abdomen: Soft.  Nondistended.  Mildly tender at incisions only.  No evidence of peritonitis.  No incarcerated hernias. Ileostomy in place with mild edema/ecchymosis but viable.  Scant flatus in bag.  No stool/effluent yet.  Mucosa pink/viable.  Ext:   No deformity.  No mjr edema.  No cyanosis Skin: No petechiae / purpurea.  No major sores.  Warm and dry    Results:   Cultures: No results found for this or any previous visit (from the past 720 hours).  Labs: Results for orders placed or performed during the [name] encounter of 10/18/23 (from the past 48 hours)  Creatinine, fluid (JP Drainage)     Status: None   Collection Time: 10/20/23 11:52 AM  Result Value Ref Range   Creat, Fluid 2.4 mg/dL    Comment: (NOTE) No normal range established  for this test Results should be evaluated in conjunction with serum values Performed at [facility], [address], [place], [place] [address]    Fluid Type-FCRE JP DRAINAGE     Comment: Performed at [name], [address], [place], [place] [address]  CBC     Status: Abnormal   Collection Time: 10/21/23  4:27 AM  Result Value Ref Range   WBC 9.6 4.0 - 10.5 K/uL   RBC 3.23 (L) 3.87 - 5.11 MIL/uL   Hemoglobin 9.5 (L) 12.0 - 15.0 g/dL   HCT 70.7 (L) 63.9 - 53.9 %   MCV 90.4 80.0 - 100.0 fL   MCH 29.4 26.0 - 34.0 pg   MCHC 32.5 30.0 - 36.0 g/dL   RDW 85.4 88.4 - 84.4 %   Platelets 178 150 - 400 K/uL   nRBC 0.0 0.0 - 0.2 %    Comment: Performed at [facility], [address] [place], [place] [address]  Basic metabolic panel     Status: Abnormal   Collection Time: 10/21/23  4:27 AM  Result Value Ref Range   Sodium 139 135 - 145  mmol/L   Potassium 4.3 3.5 - 5.1 mmol/L   Chloride 104 98 - 111 mmol/L   CO2 24 22 - 32 mmol/L   Glucose, Bld 109 (H) 70 - 99 mg/dL    Comment: Glucose reference range applies only to samples taken after fasting for at least 8 hours.   BUN 25 (H) 8 - 23 mg/dL   Creatinine, Ser 8.28 (H) 0.44 - 1.00 mg/dL   Calcium 9.1 8.9 - 89.6 mg/dL   GFR, Estimated 31 (L) >60 mL/min    Comment: (NOTE) Calculated using the CKD-EPI Creatinine Equation (2021)    Anion gap 11 5 - 15    Comment: Performed at [name], [address] [place], [place] [address]    Imaging / Studies: No results found.  Medications / Allergies: per chart  Antibiotics: Anti-infectives (From admission, onward)    Start     Dose/Rate Route Frequency Ordered Stop   10/18/23 2200  cefoTEtan  (CEFOTAN ) 2 g in sodium chloride  0.9 % 100 mL IVPB  Status:  Discontinued       Note to Pharmacy: Pharmacy may adjust dosing strength, schedule, rate of infusion, etc as needed to optimize therapy (has CKD)   2 g 200 mL/hr over 30 Minutes Intravenous Every 24 hours 10/18/23 1848 10/21/23 0720   10/18/23 1930  ceFAZolin  (ANCEF ) IVPB 2g/100 mL premix        2 g 200 mL/hr over 30 Minutes Intravenous Every 8 hours 10/18/23 1848 10/18/23 2014   10/18/23 0845  ceFAZolin  (ANCEF ) IVPB 2g/100 mL premix       Placed in And Linked Group   2 g 200 mL/hr over 30 Minutes Intravenous On call to O.R. 10/18/23 0839 10/18/23 1122   10/18/23 0845  metroNIDAZOLE  (FLAGYL ) IVPB 500 mg       Placed in And Linked Group   500 mg 100 mL/hr over 60 Minutes Intravenous On call to O.R. 10/18/23 0839 10/18/23 1146         Note: Portions of this report may have been transcribed using voice recognition software. Every effort was made to ensure accuracy; however, inadvertent computerized transcription errors may be present.   Any transcriptional errors that result from this process are unintentional.    Elspeth KYM Schultze, MD,  FACS, MASCRS Esophageal, Gastrointestinal &  Colorectal Surgery Robotic and Minimally Invasive Surgery  [facility] [address] [place], [place] [address] [phone] Fax [phone] Main  CONTACT INFORMATION: Weekday (9AM-5PM): Call [facility] main office at [phone] Weeknight (5PM-9AM) or Weekend/Holiday: Check EPIC Web Links tab & use AMION (password  [email or web address]) for General Surgery [facility] coverage  Please, DO NOT use SecureChat  (it is not reliable communication to reach operating surgeons & will lead to a delay in care).   Epic staff messaging available for outpatient concerns needing 1-2 business day response.      10/22/2023  8:18 AM

## 2023-10-22 NOTE — Plan of Care (Signed)
   Problem: Education: Goal: Knowledge of General Education information will improve Description Including pain rating scale, medication(s)/side effects and non-pharmacologic comfort measures Outcome: Progressing   Problem: Health Behavior/Discharge Planning: Goal: Ability to manage health-related needs will improve Outcome: Progressing

## 2023-10-22 NOTE — TOC Initial Note (Addendum)
 Transition of Care [facility]) - Initial/Assessment Note    Patient Details  Name: Donna [name] MRN: [id number] Date of Birth: 1945-03-24  Transition of Care [facility]) CM/SW Contact:    Alfonse JONELLE Rex, RN Phone Number: 10/22/2023, 2:15 PM  Clinical Narrative:   Met with patient at bedside to introduce role of TOC/NCM and review for dc planning, PT recommendation for [facility] PT/OT( MD entered order for [facility] PT/HHA) patient agreeable, no preference. Patient states she lives with her son and his family, has a RW and Cane at home, no current home care services, has an established PCP. TOC will arrange HH , continue to follow for dc.   -2:25pm Referra to [facility], rep-Cory, accepted for [facility] PT/HHA, added to AVS.              Expected Discharge Plan: Home w Home Health Services Barriers to Discharge: Continued Medical Work up   Patient Goals and CMS Choice Patient states their goals for this hospitalization and ongoing recovery are:: return home CMS Medicare.gov Compare Post Acute Care list provided to:: Patient Choice offered to / list presented to : Patient [place] ownership interest in [facility].provided to:: Patient    Expected Discharge Plan and Services     Post Acute Care Choice: Durable Medical Equipment Living arrangements for the past 2 months: Single Family Home                                      Prior Living Arrangements/Services Living arrangements for the past 2 months: Single Family Home Lives with:: Relatives Patient language and need for interpreter reviewed:: Yes Do you feel safe going back to the place where you live?: Yes      Need for Family Participation in Patient Care: Yes (Comment) Care giver support system in place?: Yes (comment) Current home services: DME (RW, Cane) Criminal Activity/Legal Involvement Pertinent to Current Situation/Hospitalization: No - Comment as needed  Activities of Daily Living   ADL Screening (condition at time  of admission) Independently performs ADLs?: Yes (appropriate for developmental age) Is the patient deaf or have difficulty hearing?: Yes Does the patient have difficulty seeing, even when wearing glasses/contacts?: Yes Does the patient have difficulty concentrating, remembering, or making decisions?: Yes  Permission Sought/Granted                  Emotional Assessment Appearance:: Appears stated age Attitude/Demeanor/Rapport: Engaged Affect (typically observed): Accepting Orientation: : Oriented to Self, Oriented to Place, Oriented to  Time, Oriented to Situation Alcohol / Substance Use: Not Applicable Psych Involvement: No (comment)  Admission diagnosis:  Para-ileostomy hernia (HCC) [K94.19, K43.5] Patient Active Problem List   Diagnosis Date Noted   Pure hypercholesterolemia 10/18/2023   Prediabetes 10/18/2023   Obstructive sleep apnea syndrome 10/18/2023   Ileostomy present (HCC) 10/18/2023   History of ulcerative colitis 10/18/2023   History of malignant neoplasm of colon 10/18/2023   Stage 3b chronic kidney disease (HCC) 10/18/2023   Sciatica 10/18/2023   Esophageal dysphagia 10/18/2023   Recurrent incisional hernia with incarceration 09/24/2023   Para-ileostomy hernia (HCC) 09/24/2023   Ulcerative colitis (HCC) 09/24/2023   Fatigue 04/26/2022   Dyspnea on exertion 03/20/2021   Elevated coronary artery calcium score 03/19/2021   S/P total colectomy 05/29/2019   Primary osteoarthritis of left knee 06/20/2018   Essential hypertension 01/21/2015   Gastroesophageal reflux disease 01/21/2015   Degeneration of lumbar  or lumbosacral intervertebral disc 01/21/2015   Hyperlipidemia 01/21/2015   Bilateral carpal tunnel syndrome 01/05/2015   PCP:  Clarice Nottingham, MD Pharmacy:   [facility] [address] ([place]), [place] - [address] [place] ([place]) [place] [address] Phone: [phone] Fax: [phone]  [facility] Mail  Delivery - [place], [place] - [address] [place] [address] Phone: [phone] Fax: [phone]     Social Drivers of Health (SDOH) Social History: SDOH Screenings   Food Insecurity: No Food Insecurity (10/19/2023)  Housing: Low Risk  (10/19/2023)  Transportation Needs: No Transportation Needs (10/19/2023)  Utilities: Not At Risk (10/19/2023)  Depression (PHQ2-9): Low Risk  (11/03/2021)  Social Connections: Moderately Integrated (10/19/2023)  Tobacco Use: Low Risk  (10/18/2023)   SDOH Interventions:     Readmission Risk Interventions    10/22/2023    2:13 PM  Readmission Risk Prevention Plan  Transportation Screening Complete  PCP or Specialist Appt within 5-7 Days Complete  Home Care Screening Complete  Medication Review (RN CM) Complete

## 2023-10-23 LAB — CREATININE, SERUM
Creatinine, Ser: 1.09 mg/dL — ABNORMAL HIGH (ref 0.44–1.00)
GFR, Estimated: 52 mL/min — ABNORMAL LOW (ref >60)

## 2023-10-23 LAB — POTASSIUM: Potassium: 3.8 mmol/L (ref 3.5–5.1)

## 2023-10-23 MED ORDER — ENOXAPARIN SODIUM 40 MG/0.4ML IJ SOSY
40.0000 mg | PREFILLED_SYRINGE | INTRAMUSCULAR | Status: DC
  Administered 2023-10-23 – 2023-10-31 (×9): 40 mg via SUBCUTANEOUS
  Filled 2023-10-23 (×9): qty 0.4

## 2023-10-23 MED ORDER — FUROSEMIDE 10 MG/ML IJ SOLN
40.0000 mg | Freq: Once | INTRAMUSCULAR | Status: AC
  Administered 2023-10-23: 40 mg via INTRAVENOUS
  Filled 2023-10-23: qty 4

## 2023-10-23 NOTE — Plan of Care (Signed)

## 2023-10-23 NOTE — Plan of Care (Signed)
   Problem: Education: Goal: Knowledge of General Education information will improve Description Including pain rating scale, medication(s)/side effects and non-pharmacologic comfort measures Outcome: Progressing   Problem: Health Behavior/Discharge Planning: Goal: Ability to manage health-related needs will improve Outcome: Progressing

## 2023-10-23 NOTE — Progress Notes (Signed)
 Physical Therapy Treatment Patient Details Name: Donna [name] MRN: [id number] DOB: 08-17-1945 Today's Date: 10/23/2023   History of Present Illness 78 yo female presents to therapy following hospitalization on 10/18/2023 due to para-ileostomy hernia. Pt underwent repair of hernia on 10/17.  Pt PMH includes but is not limited to: DOE, fatigue, colon ca s/p colectomy, OA, HTN, GERD, HLD and L TKA.    PT Comments   Pt admitted with above diagnosis.  Pt currently with functional limitations due to the deficits listed below (see PT Problem List). PT arrived 1459 and pt eating a meal in bed requesting PT return later in the day if able. PT returned 1709 and pt agreeable to therapy intervention. Pt reported need to void bladder, pt required min A for supine <> sit with ongoing cues for log roll technique due to recent abdominal surgery and pain, pt required CGA and min cues for safety with sit to stand, CGA for gait tasks in personal room to and from the bathroom 15 feet x 2 with RW. Pt able to stand ~ 5 mins no UE support performing ADL tasks no overt LOB. Pt requested to return to bed, sitting upright in recliner increases abdominal pain, pt ed provided on importance/benefits of increased OOB time and pt verbalized understanding. Pt left in bed and all needs in place.  Pt will benefit from acute skilled PT to increase their independence and safety with mobility to allow discharge.      If plan is discharge home, recommend the following: A little help with walking and/or transfers;A little help with bathing/dressing/bathroom;Assistance with cooking/housework;Assist for transportation;Help with stairs or ramp for entrance   Can travel by private vehicle        Equipment Recommendations  None recommended by PT    Recommendations for Other Services       Precautions / Restrictions Precautions Precautions: Fall Recall of Precautions/Restrictions: Intact Precaution/Restrictions Comments:  ileostomy bag, drain Restrictions Weight Bearing Restrictions Per Provider Order: No     Mobility  Bed Mobility Overal bed mobility: Needs Assistance Bed Mobility: Sit to Supine, Supine to Sit     Supine to sit: HOB elevated, Used rails, Min assist Sit to supine: Used rails, Min assist   General bed mobility comments: pt guided through log roll technique with HOB sligtly elevated with supine to sit min A for B LE and for trunk, bed flat for sit to supine min A for B LE to return to bed    Transfers Overall transfer level: Needs assistance Equipment used: Rolling walker (2 wheels) Transfers: Sit to/from Stand Sit to Stand: Contact guard assist   Step pivot transfers: Contact guard assist       General transfer comment: min cues and CGA for sit to stand from EOB and commode    Ambulation/Gait Ambulation/Gait assistance: Contact guard assist Gait Distance (Feet): 15 Feet Assistive device: Rolling walker (2 wheels) Gait Pattern/deviations: Step-through pattern, Trunk flexed Gait velocity: decreased     General Gait Details: slight trunk flexion due to abdominal pain, pt required frequent standing rest breaks secondary to grabbing like abominal pain min cues for safety and RW management   Stairs             Wheelchair Mobility     Tilt Bed    Modified Rankin (Stroke Patients Only)       Balance Overall balance assessment: Needs assistance Sitting-balance support: No upper extremity supported, Feet supported Sitting balance-Leahy Scale: Good  Standing balance support: Single extremity supported, During functional activity Standing balance-Leahy Scale: Fair Standing balance comment: static standing no UE support pt able to perform basic ADL tasks standing in bathroom with wide BOS no UE support and no overt LOB with pt reporting fatigue with increasing abdominal and back pain                            Communication  Communication Communication: No apparent difficulties  Cognition Arousal: Alert Behavior During Therapy: WFL for tasks assessed/performed   PT - Cognitive impairments: No apparent impairments                         Following commands: Intact      Cueing Cueing Techniques: Verbal cues  Exercises      General Comments        Pertinent Vitals/Pain Pain Assessment Pain Assessment: Faces Faces Pain Scale: Hurts little more Breathing: normal Pain Location: abdomen Pain Descriptors / Indicators: Aching, Constant, Discomfort, Grimacing, Guarding, Spasm Pain Intervention(s): Limited activity within patient's tolerance, Monitored during session, Repositioned    Home Living                          Prior Function            PT Goals (current goals can now be found in the care plan section) Acute Rehab PT Goals Patient Stated Goal: to be able to get stronger get rid of the pain and go home PT Goal Formulation: With patient Time For Goal Achievement: 11/04/23 Potential to Achieve Goals: Good Progress towards PT goals: Progressing toward goals    Frequency    Min 3X/week      PT Plan      Co-evaluation              AM-PAC PT 6 Clicks Mobility   Outcome Measure  Help needed turning from your back to your side while in a flat bed without using bedrails?: A Little Help needed moving from lying on your back to sitting on the side of a flat bed without using bedrails?: A Little Help needed moving to and from a bed to a chair (including a wheelchair)?: A Little Help needed standing up from a chair using your arms (e.g., wheelchair or bedside chair)?: A Little Help needed to walk in hospital room?: A Little Help needed climbing 3-5 steps with a railing? : A Lot 6 Click Score: 17    End of Session Equipment Utilized During Treatment: Gait belt Activity Tolerance: Patient tolerated treatment well;No increased pain Patient left: in bed;with  call bell/phone within reach;with bed alarm set Nurse Communication: Mobility status PT Visit Diagnosis: Unsteadiness on feet (R26.81);Pain Pain - part of body:  (abdomen)     Time: 8290-8267 PT Time Calculation (min) (ACUTE ONLY): 23 min  Charges:    $Therapeutic Activity: 8-22 mins PT General Charges $$ ACUTE PT VISIT: 1 Visit                     [place], PT Acute Rehab    [place] VEAR Drone 10/23/2023, 5:36 PM

## 2023-10-23 NOTE — Progress Notes (Signed)
 10/23/2023  Donna [name] [id number] [birth year]  CARE TEAM: PCP: Clarice Nottingham, MD  Outpatient Care Team: Patient Care Team: Clarice Nottingham, MD as PCP - General (Internal Medicine) Elmira Newman PARAS, MD as PCP - Cardiology (Cardiology) Teresa Lonni HERO, MD as Consulting Physician (Colon and Rectal Surgery) Sheldon Standing, MD as Consulting Physician (General Surgery) Donnald Charleston, MD as Consulting Physician (Gastroenterology) Elmira Newman PARAS, MD as Consulting Physician (Cardiology)  Inpatient Treatment Team: Treatment Team:  Sheldon Standing, MD Massie Delaine SAUNDERS, RN Bullins, Powell PARAS, RN McGirr, Mliss BRAVO, NT Francella Ferrier, RN Butler Leita PARAS, OT Carolee Rosaline DASEN, RPH Estelle Hunter DEL, RN Lucious Maurilio CROME, RN   Problem List:   Principal Problem:   Para-ileostomy hernia [facility]) Active Problems:   Gastroesophageal reflux disease   S/P total colectomy   Recurrent incisional hernia with incarceration   Prediabetes   Obstructive sleep apnea syndrome   Ileostomy present (HCC)   History of ulcerative colitis   History of malignant neoplasm of colon   Stage 3b chronic kidney disease (HCC)   Ulcerative colitis (HCC)   10/18/2023  POST-OPERATIVE DIAGNOSIS:  PARASTOMAL HERNIA AND VENTRAL INCISIONAL RECURRENT AND INCARCERATED ABDOMINAL WALL HERNIAE  Dimensions of hernia post-op:  16cm x 8cm   PROCEDURE:   ROBOTIC COMPONENT SEPARATION (transversus abdominis release = TAR) on right Repair of incarcerated recurrent incisional and parastomal hernias with mesh ROBOTIC LYSIS OF ADHESIONS SEROSAL REPAIR x2 END ILEOSTOMY REVISION TAP BLOCK - BILATERAL   SURGEON:  Standing KYM Sheldon, MD  OR FINDINGS:  Moderately dense adhesions small bowel to anterior abdominal wall and periumbilical midline hernia.  Swiss cheese hernias extending up to the subxiphoid region.  15 cm in length.  Moderate parastomal hernia around end ileostomy with a foot of small bowel densely  adherent to the hernia sac.  Serosal repairs down x 2.  Ileostomy revision done for last 7 cm given poor tissues.   Type of ventral wall repair:  Laparoscopic underlay repair .with Primary repair of largest hernia Placement of mesh: Right lateral and flank retrorectus preperitoneal.  Midline intraperitoneal. Name of mesh: Bard Ventralight dual sided (polypropylene / Seprafilm) Size of mesh: 33x27cm Orientation: Transverse Mesh overlap:  7cm    Assessment [facility] Stay = 5 days) 5 Days Post-Op    Ileus most likely due to edema and kinking at parastomal ileostomy Sugarbaker repair     Plan:  I suspect she has some edema and swelling which making the ileostomy not empty great yet.   -Go back down to clear liquids.   -Nausea control.  NG tube if worse.   -Continue soluble fiber.  Will do the FiberCon since she prefers the pill form. -Hold off on any more aggressive bowel regimen for now. - If she feels a lot worse, may need NG tube and bowel rest.  Try and hold off  Follow off IV fluids to see if creatinine can stay stable on its own.  Try and keep her on the dry side to avoid ileus or edema.  To see if we can get some bowel edema down and help her open up.  IV fluid backup just in case.  Continue drain in abdominal wall to decrease seroma formation.  Drain creatinine same as serum reassuring.  Most likely will remove on day of discharge  Multimodal pain control.  -monitor electrolytes & replace as needed  Keep K>4, Mg>2, Phos>3  - GERD.  Continue Pepcid H2 blocker  Sleep apnea.  Oxygen nightly as  needed.  -VTE prophylaxis- SCDs.  Anticoagulation prophyllaxis SQ as appropriate  -mobilize as tolerated to help recovery.  Enlist therapies in moderate/high risk patients as appropriate  I updated the patient's status to the patient  Recommendations were made.  Questions were answered.  She expressed understanding & appreciation.  -Disposition:  Disposition:  The patient is  from: Home Anticipate discharge to:  Home with Home Health Anticipated Date of Discharge is:  October 23,2025   Barriers to discharge:  Pending Clinical improvement (more likely than not), Therapy assessment & Recommendations pending, and Consultant clearance & sign off    Patient currently is NOT MEDICALLY STABLE for discharge from the hospital from a surgery standpoint.      I reviewed nursing notes, last 24 h vitals and pain scores, last 48 h intake and output, last 24 h labs and trends, and last 24 h imaging results.  I have reviewed this patient's available data, including medical history, events of note, test results, etc as part of my evaluation.   A significant portion of that time was spent in counseling. Care during the described time interval was provided by me.  This care required moderate level of medical decision making.  10/23/2023    Subjective: (Chief complaint)  Felt nauseated and spit up after drinking liquids.  Little nausea and bloating.  Walking okay.  Objective:  Vital signs:  Vitals:   10/22/23 0805 10/22/23 1458 10/22/23 2045 10/23/23 0539  BP: (!) 131/50 138/68 (!) 149/66 (!) 149/101  Pulse: 77 78 82 87  Resp: 14 14 18 16   Temp: 98.7 F (37.1 C) 98.9 F (37.2 C) 98.8 F (37.1 C) 98.7 F (37.1 C)  TempSrc:   Oral   SpO2: 93% 96% 93% 98%  Weight:      Height:        Last BM Date :  (ileostomy)  Intake/Output   Yesterday:  10/21 0701 - 10/22 0700 In: 1083 [P.O.:1080; I.V.:3] Out: 1190 [Urine:1100; Drains:70; Stool:20] This shift:  No intake/output data recorded.  Bowel function:  Flatus: YES  BM:  No  Drain: Serous   Physical Exam:  General: Pt awake/alert in no acute distress.  Calm.  Relaxed.  Bright.  Alert. Eyes: PERRL, normal EOM.  Sclera clear.  No icterus Neuro: CN II-XII intact w/o focal sensory/motor deficits. Lymph: No head/neck/groin lymphadenopathy Psych:  No delerium/psychosis/paranoia.  Oriented x 4 HENT:  Normocephalic, Mucus membranes moist.  No thrush Neck: Supple, No tracheal deviation.  No obvious thyromegaly Chest: No pain to chest wall compression.  Good respiratory excursion.  No audible wheezing CV:  Pulses intact.  Regular rhythm.  No major extremity edema MS: Normal AROM mjr joints.  No obvious deformity  Abdomen: Soft.  Mildy distended.  Mildly tender at incisions only.  No evidence of peritonitis.  No incarcerated hernias. Ileostomy in place with mild edema/ecchymosis but viable.  Scant flatus in bag.  No stool/effluent yet.  Mucosa pink/viable.  Ext:   No deformity.  No mjr edema.  No cyanosis Skin: No petechiae / purpurea.  No major sores.  Warm and dry    Results:   Cultures: No results found for this or any previous visit (from the past 720 hours).  Labs: Results for orders placed or performed during the hospital encounter of 10/18/23 (from the past 48 hours)  Magnesium      Status: None   Collection Time: 10/22/23  7:58 AM  Result Value Ref Range   Magnesium  2.3 1.7 - 2.4  mg/dL    Comment: Performed at [facility], [address] [place], [place] [address]  Phosphorus     Status: Abnormal   Collection Time: 10/22/23  7:58 AM  Result Value Ref Range   Phosphorus 2.4 (L) 2.5 - 4.6 mg/dL    Comment: Performed at [facility], [address] [place], [place] [address]  Prealbumin     Status: Abnormal   Collection Time: 10/22/23  7:58 AM  Result Value Ref Range   Prealbumin 13 (L) 18 - 38 mg/dL    Comment: Performed at [facility], [address], [place], [place] [address]  Basic metabolic panel with GFR     Status: Abnormal   Collection Time: 10/22/23  7:58 AM  Result Value Ref Range   Sodium 135 135 - 145 mmol/L   Potassium 4.0 3.5 - 5.1 mmol/L   Chloride 103 98 - 111 mmol/L   CO2 22 22 - 32 mmol/L   Glucose, Bld 101 (H) 70 - 99 mg/dL    Comment: Glucose reference range applies only to samples taken after fasting  for at least 8 hours.   BUN 30 (H) 8 - 23 mg/dL   Creatinine, Ser 8.41 (H) 0.44 - 1.00 mg/dL   Calcium 9.0 8.9 - 89.6 mg/dL   GFR, Estimated 34 (L) >60 mL/min    Comment: (NOTE) Calculated using the CKD-EPI Creatinine Equation (2021)    Anion gap 10 5 - 15    Comment: Performed at [facility], [address], [place], [place] [address]  CBC     Status: Abnormal   Collection Time: 10/22/23  7:58 AM  Result Value Ref Range   WBC 6.7 4.0 - 10.5 K/uL   RBC 2.81 (L) 3.87 - 5.11 MIL/uL   Hemoglobin 8.6 (L) 12.0 - 15.0 g/dL   HCT 73.4 (L) 63.9 - 53.9 %   MCV 94.3 80.0 - 100.0 fL   MCH 30.6 26.0 - 34.0 pg   MCHC 32.5 30.0 - 36.0 g/dL   RDW 85.4 88.4 - 84.4 %   Platelets 176 150 - 400 K/uL   nRBC 0.0 0.0 - 0.2 %    Comment: Performed at [facility], [address] [place], [place] [address]  Potassium     Status: None   Collection Time: 10/23/23  5:13 AM  Result Value Ref Range   Potassium 3.8 3.5 - 5.1 mmol/L    Comment: Performed at [facility], [address], [place], [place] [address]  Creatinine, serum     Status: Abnormal   Collection Time: 10/23/23  5:13 AM  Result Value Ref Range   Creatinine, Ser 1.09 (H) 0.44 - 1.00 mg/dL   GFR, Estimated 52 (L) >60 mL/min    Comment: (NOTE) Calculated using the CKD-EPI Creatinine Equation (2021) Performed at [facility], [address] [place], [place] [address]     Imaging / Studies: No results found.  Medications / Allergies: per chart  Antibiotics: Anti-infectives (From admission, onward)    Start     Dose/Rate Route Frequency Ordered Stop   10/18/23 2200  cefoTEtan  (CEFOTAN ) 2 g in sodium chloride  0.9 % 100 mL IVPB  Status:  Discontinued       Note to Pharmacy: Pharmacy may adjust dosing strength, schedule, rate of infusion, etc as needed to optimize therapy (has CKD)   2 g 200 mL/hr over 30 Minutes Intravenous Every 24 hours 10/18/23  1848  10/21/23 0720   10/18/23 1930  ceFAZolin  (ANCEF ) IVPB 2g/100 mL premix        2 g 200 mL/hr over 30 Minutes Intravenous Every 8 hours 10/18/23 1848 10/18/23 2014   10/18/23 0845  ceFAZolin  (ANCEF ) IVPB 2g/100 mL premix       Placed in And Linked Group   2 g 200 mL/hr over 30 Minutes Intravenous On call to O.R. 10/18/23 0839 10/18/23 1122   10/18/23 0845  metroNIDAZOLE  (FLAGYL ) IVPB 500 mg       Placed in And Linked Group   500 mg 100 mL/hr over 60 Minutes Intravenous On call to O.R. 10/18/23 0839 10/18/23 1146         Note: Portions of this report may have been transcribed using voice recognition software. Every effort was made to ensure accuracy; however, inadvertent computerized transcription errors may be present.   Any transcriptional errors that result from this process are unintentional.    Elspeth KYM Schultze, MD, FACS, MASCRS Esophageal, Gastrointestinal & Colorectal Surgery Robotic and Minimally Invasive Surgery  [facility] [address] [place], [place] [address] [phone] Fax [phone] Main  CONTACT INFORMATION: Weekday (9AM-5PM): Call [facility] main office at [phone] Weeknight (5PM-9AM) or Weekend/Holiday: Check EPIC Web Links tab & use AMION (password  [email or web address]) for General Surgery [facility] coverage  Please, DO NOT use SecureChat  (it is not reliable communication to reach operating surgeons & will lead to a delay in care).   Epic staff messaging available for outpatient concerns needing 1-2 business day response.      10/23/2023  7:17 AM

## 2023-10-24 ENCOUNTER — Inpatient Hospital Stay (HOSPITAL_COMMUNITY)

## 2023-10-24 ENCOUNTER — Other Ambulatory Visit (HOSPITAL_COMMUNITY): Payer: Self-pay

## 2023-10-24 ENCOUNTER — Encounter (HOSPITAL_COMMUNITY): Payer: Self-pay | Admitting: Surgery

## 2023-10-24 LAB — POTASSIUM: Potassium: 4 mmol/L (ref 3.5–5.1)

## 2023-10-24 LAB — CREATININE, SERUM
Creatinine, Ser: 1.52 mg/dL — ABNORMAL HIGH (ref 0.44–1.00)
GFR, Estimated: 35 mL/min — ABNORMAL LOW (ref >60)

## 2023-10-24 MED ORDER — LACTATED RINGERS IV BOLUS: 1000.0000 mL | Freq: Three times a day (TID) | INTRAVENOUS | Status: AC | PRN

## 2023-10-24 MED ORDER — BARIUM SULFATE 2 % PO SUSP
450.0000 mL | ORAL | Status: AC
  Administered 2023-10-24 (×2): 450 mL via ORAL

## 2023-10-24 MED ORDER — BENAZEPRIL HCL 20 MG PO TABS
20.0000 mg | ORAL_TABLET | Freq: Every day | ORAL | Status: DC
Start: 2023-10-24 — End: 2023-10-26
  Administered 2023-10-24: 10 mg via ORAL
  Administered 2023-10-25: 20 mg via ORAL
  Filled 2023-10-24 (×3): qty 1

## 2023-10-24 NOTE — Progress Notes (Signed)
 Mobility Specialist - Progress Note   10/24/23 1110  Mobility  Activity Ambulated with assistance  Level of Assistance Minimal assist, patient does 75% or more  Assistive Device Front wheel walker  Distance Ambulated (ft) 30 ft  Range of Motion/Exercises Active  Activity Response Tolerated fair  Mobility Referral Yes  Mobility visit 1 Mobility  Mobility Specialist Start Time (ACUTE ONLY) 1055  Mobility Specialist Stop Time (ACUTE ONLY) 1110  Mobility Specialist Time Calculation (min) (ACUTE ONLY) 15 min   Pt was found in bed and agreeable to mobilize. C/o abdominal pain and nausea during session. Notified staffing. At EOS returned to recliner chair with all needs met. Call bell in reach and chair alarm on.   Erminio Leos,  Mobility Specialist Can be reached via Secure Chat

## 2023-10-24 NOTE — Plan of Care (Signed)

## 2023-10-24 NOTE — Progress Notes (Addendum)
 10/24/2023  Donna [name] [id number] Oct 18, 1945  CARE TEAM: PCP: Clarice Nottingham, MD  Outpatient Care Team: Patient Care Team: Clarice Nottingham, MD as PCP - General (Internal Medicine) Elmira Newman PARAS, MD as PCP - Cardiology (Cardiology) Teresa Lonni HERO, MD as Consulting Physician (Colon and Rectal Surgery) Sheldon Standing, MD as Consulting Physician (General Surgery) Donnald Charleston, MD as Consulting Physician (Gastroenterology) Elmira Newman PARAS, MD as Consulting Physician (Cardiology)  Inpatient Treatment Team: Treatment Team:  Sheldon Standing, MD Massie Delaine SAUNDERS, RN Bullins, Powell PARAS, RN Ina Charmaine HERO, RN Leron Riis, [place]   Problem List:   Principal Problem:   Para-ileostomy hernia [facility]) Active Problems:   Gastroesophageal reflux disease   S/P total colectomy   Recurrent incisional hernia with incarceration   Prediabetes   Obstructive sleep apnea syndrome   Ileostomy present (HCC)   History of ulcerative colitis   History of malignant neoplasm of colon   Stage 3b chronic kidney disease (HCC)   Ulcerative colitis (HCC)   10/18/2023  POST-OPERATIVE DIAGNOSIS:  PARASTOMAL HERNIA AND VENTRAL INCISIONAL RECURRENT AND INCARCERATED ABDOMINAL WALL HERNIAE  Dimensions of hernia post-op:  16cm x 8cm   PROCEDURE:   ROBOTIC COMPONENT SEPARATION (transversus abdominis release = TAR) on right Repair of incarcerated recurrent incisional and parastomal hernias with mesh ROBOTIC LYSIS OF ADHESIONS SEROSAL REPAIR x2 END ILEOSTOMY REVISION TAP BLOCK - BILATERAL   SURGEON:  Standing KYM Sheldon, MD  OR FINDINGS:  Moderately dense adhesions small bowel to anterior abdominal wall and periumbilical midline hernia.  Swiss cheese hernias extending up to the subxiphoid region.  15 cm in length.  Moderate parastomal hernia around end ileostomy with a foot of small bowel densely adherent to the hernia sac.  Serosal repairs down x 2.  Ileostomy revision done for last 7 cm  given poor tissues.   Type of ventral wall repair:  Laparoscopic underlay repair .with Primary repair of largest hernia Placement of mesh: Right lateral and flank retrorectus preperitoneal.  Midline intraperitoneal. Name of mesh: Bard Ventralight dual sided (polypropylene / Seprafilm) Size of mesh: 33x27cm Orientation: Transverse Mesh overlap:  7cm    Assessment [facility] Stay = 6 days) 6 Days Post-Op    Ileus most likely due to edema and kinking at parastomal ileostomy Sugarbaker repair     Plan:  I suspect she has some edema and swelling which making the ileostomy not empty great yet.   -Clear liquids.   -Nausea control.  NG tube if worse.   -Continue soluble fiber.  Will do the FiberCon since she prefers the pill form. -Hold off on any more aggressive bowel regimen for now. - Will order CT scan of abdomen pelvis to see if this is an ileus versus if there is evidence of some kinking or obstruction on the right flank where the end ileostomy is hemic to the side with a Occupational hygienist.  I do not know if I really need IV contrast since there is some questionable allergy but I do think enteral contrast will be very helpful.  She should be able to tolerate oral contrast that.  Can premedicate if needed.  Keep her on the dry side to avoid ileus or edema.  To see if we can get some bowel edema down and help her open up.  IV fluid backup just in case.  Continue drain in abdominal wall to decrease seroma formation.  Drain creatinine same as serum reassuring.  Most likely will remove on day of discharge  Multimodal  pain control.  -monitor electrolytes & replace as needed  Keep K>4, Mg>2, Phos>3  - GERD.  Continue Pepcid H2 blocker  Sleep apnea.  Oxygen nightly as needed.  -VTE prophylaxis- SCDs.  Anticoagulation prophyllaxis SQ as appropriate  -mobilize as tolerated to help recovery.  Enlist therapies in moderate/high risk patients as appropriate  I updated the patient's  status to the patient  Recommendations were made.  Questions were answered.  She expressed understanding & appreciation.  -Disposition:  Disposition:  The patient is from: Home Anticipate discharge to:  Home with Home Health Anticipated Date of Discharge is:  October 25,2025   Barriers to discharge:  Pending Clinical improvement (more likely than not), Therapy assessment & Recommendations pending, and Consultant clearance & sign off    Patient currently is NOT MEDICALLY STABLE for discharge from the hospital from a surgery standpoint.      I reviewed nursing notes, last 24 h vitals and pain scores, last 48 h intake and output, last 24 h labs and trends, and last 24 h imaging results.  I have reviewed this patient's available data, including medical history, events of note, test results, etc as part of my evaluation.   A significant portion of that time was spent in counseling. Care during the described time interval was provided by me.  This care required moderate level of medical decision making.  10/24/2023    Subjective: (Chief complaint)  No more emesis.  Not as nauseated but not much of an appetite.  Burping but not having much flatus.  Walking okay.  Pain mostly controlled.  Objective:  Vital signs:  Vitals:   10/23/23 0539 10/23/23 1447 10/23/23 2106 10/24/23 0551  BP: (!) 149/101 126/69 120/87 (!) 119/58  Pulse: 87 76 83 73  Resp: 16 16 16 16   Temp: 98.7 F (37.1 C) 97.9 F (36.6 C) 98.5 F (36.9 C) 98.7 F (37.1 C)  TempSrc:  Oral Oral Oral  SpO2: 98% 99% 95% 95%  Weight:      Height:        Last BM Date :  (minimal output from ileostomy)  Intake/Output   Yesterday:  10/22 0701 - 10/23 0700 In: 930 [P.O.:920; I.V.:10] Out: 1365 [Urine:950; Drains:415] This shift:  No intake/output data recorded.  Bowel function:  Flatus: YES  BM:  No  Drain: Serous   Physical Exam:  General: Pt awake/alert in no acute distress.  Calm.  Relaxed.   Bright.  Alert. Eyes: PERRL, normal EOM.  Sclera clear.  No icterus Neuro: CN II-XII intact w/o focal sensory/motor deficits. Lymph: No head/neck/groin lymphadenopathy Psych:  No delerium/psychosis/paranoia.  Oriented x 4 HENT: Normocephalic, Mucus membranes moist.  No thrush Neck: Supple, No tracheal deviation.  No obvious thyromegaly Chest: No pain to chest wall compression.  Good respiratory excursion.  No audible wheezing CV:  Pulses intact.  Regular rhythm.  No major extremity edema MS: Normal AROM mjr joints.  No obvious deformity  Abdomen: Obese. Soft.  Nondistended.  Mildly tender at incisions only.  No evidence of peritonitis.  No incarcerated hernias. Ileostomy in place with mild edema/ecchymosis but viable.  Scant flatus in bag.  No stool/effluent yet.  Mucosa pink/viable.  Ext:   No deformity.  No mjr edema.  No cyanosis Skin: No petechiae / purpurea.  No major sores.  Warm and dry    Results:   Cultures: No results found for this or any previous visit (from the past 720 hours).  Labs: Results for orders placed  or performed during the hospital encounter of 10/18/23 (from the past 48 hours)  Magnesium      Status: None   Collection Time: 10/22/23  7:58 AM  Result Value Ref Range   Magnesium  2.3 1.7 - 2.4 mg/dL    Comment: Performed at [facility], [address] [place], [place] [address]  Phosphorus     Status: Abnormal   Collection Time: 10/22/23  7:58 AM  Result Value Ref Range   Phosphorus 2.4 (L) 2.5 - 4.6 mg/dL    Comment: Performed at [facility], [address], [place], [place] [address]  Prealbumin     Status: Abnormal   Collection Time: 10/22/23  7:58 AM  Result Value Ref Range   Prealbumin 13 (L) 18 - 38 mg/dL    Comment: Performed at [facility], [address] [place], [place] [address]  Basic metabolic panel with GFR     Status: Abnormal   Collection Time: 10/22/23  7:58 AM  Result Value Ref Range    Sodium 135 135 - 145 mmol/L   Potassium 4.0 3.5 - 5.1 mmol/L   Chloride 103 98 - 111 mmol/L   CO2 22 22 - 32 mmol/L   Glucose, Bld 101 (H) 70 - 99 mg/dL    Comment: Glucose reference range applies only to samples taken after fasting for at least 8 hours.   BUN 30 (H) 8 - 23 mg/dL   Creatinine, Ser 8.41 (H) 0.44 - 1.00 mg/dL   Calcium 9.0 8.9 - 89.6 mg/dL   GFR, Estimated 34 (L) >60 mL/min    Comment: (NOTE) Calculated using the CKD-EPI Creatinine Equation (2021)    Anion gap 10 5 - 15    Comment: Performed at [facility], [address], [place], [place] [address]  CBC     Status: Abnormal   Collection Time: 10/22/23  7:58 AM  Result Value Ref Range   WBC 6.7 4.0 - 10.5 K/uL   RBC 2.81 (L) 3.87 - 5.11 MIL/uL   Hemoglobin 8.6 (L) 12.0 - 15.0 g/dL   HCT 73.4 (L) 63.9 - 53.9 %   MCV 94.3 80.0 - 100.0 fL   MCH 30.6 26.0 - 34.0 pg   MCHC 32.5 30.0 - 36.0 g/dL   RDW 85.4 88.4 - 84.4 %   Platelets 176 150 - 400 K/uL   nRBC 0.0 0.0 - 0.2 %    Comment: Performed at [facility], [address], [place], [place] [address]  Potassium     Status: None   Collection Time: 10/23/23  5:13 AM  Result Value Ref Range   Potassium 3.8 3.5 - 5.1 mmol/L    Comment: Performed at [facility], [address], [place], [place] [address]  Creatinine, serum     Status: Abnormal   Collection Time: 10/23/23  5:13 AM  Result Value Ref Range   Creatinine, Ser 1.09 (H) 0.44 - 1.00 mg/dL   GFR, Estimated 52 (L) >60 mL/min    Comment: (NOTE) Calculated using the CKD-EPI Creatinine Equation (2021) Performed at [facility], [address] [place], [place] [address]   Potassium     Status: None   Collection Time: 10/24/23  4:52 AM  Result Value Ref Range   Potassium 4.0 3.5 - 5.1 mmol/L    Comment: Performed at [facility], [address], [place], [place] [address]  Creatinine, serum     Status:  Abnormal    Collection Time: 10/24/23  4:52 AM  Result Value Ref Range   Creatinine, Ser 1.52 (H) 0.44 - 1.00 mg/dL   GFR, Estimated 35 (L) >60 mL/min    Comment: (NOTE) Calculated using the CKD-EPI Creatinine Equation (2021) Performed at [facility] [place], [address] [place], [place] [address]     Imaging / Studies: No results found.  Medications / Allergies: per chart  Antibiotics: Anti-infectives (From admission, onward)    Start     Dose/Rate Route Frequency Ordered Stop   10/18/23 2200  cefoTEtan  (CEFOTAN ) 2 g in sodium chloride  0.9 % 100 mL IVPB  Status:  Discontinued       Note to Pharmacy: Pharmacy may adjust dosing strength, schedule, rate of infusion, etc as needed to optimize therapy (has CKD)   2 g 200 mL/hr over 30 Minutes Intravenous Every 24 hours 10/18/23 1848 10/21/23 0720   10/18/23 1930  ceFAZolin  (ANCEF ) IVPB 2g/100 mL premix        2 g 200 mL/hr over 30 Minutes Intravenous Every 8 hours 10/18/23 1848 10/18/23 2014   10/18/23 0845  ceFAZolin  (ANCEF ) IVPB 2g/100 mL premix       Placed in And Linked Group   2 g 200 mL/hr over 30 Minutes Intravenous On call to O.R. 10/18/23 0839 10/18/23 1122   10/18/23 0845  metroNIDAZOLE  (FLAGYL ) IVPB 500 mg       Placed in And Linked Group   500 mg 100 mL/hr over 60 Minutes Intravenous On call to O.R. 10/18/23 0839 10/18/23 1146         Note: Portions of this report may have been transcribed using voice recognition software. Every effort was made to ensure accuracy; however, inadvertent computerized transcription errors may be present.   Any transcriptional errors that result from this process are unintentional.    Elspeth KYM Schultze, MD, FACS, MASCRS Esophageal, Gastrointestinal & Colorectal Surgery Robotic and Minimally Invasive Surgery  [facility] [address], [address] [place], [place] [address] [phone] Fax [phone]  Main  CONTACT INFORMATION: Weekday (9AM-5PM): Call [facility] main office at [phone] Weeknight (5PM-9AM) or Weekend/Holiday: Check EPIC Web Links tab & use AMION (password  [email or web address]) for General Surgery [facility] coverage  Please, DO NOT use SecureChat  (it is not reliable communication to reach operating surgeons & will lead to a delay in care).   Epic staff messaging available for outpatient concerns needing 1-2 business day response.      10/24/2023  7:15 AM

## 2023-10-24 NOTE — Progress Notes (Signed)
 Occupational Therapy Treatment Patient Details Name: Donna [name] MRN: [id number] DOB: 1945-04-16 Today's Date: 10/24/2023   History of present illness 78 yo female presents to therapy following hospitalization on 10/18/2023 due to para-ileostomy hernia. Pt underwent repair of hernia on 10/17.  Pt PMH includes but is not limited to: DOE, fatigue, colon ca s/p colectomy, OA, HTN, GERD, HLD and L TKA.   OT comments  Pt making progress with functional goals, session limited due to fatigue. Upon arrival pt attempting to get OOB stating that she was uncomfortable in her back and was trying to reposition herself. OT assisted pt to sit EOB with log roll, max A to don socks, CGA STS to commode and chair CGA. Pt declined walking to bathroom but agreeable to stand at room sink for grooming/hygiene CGA then  pt returned to sitting EOB CGA and min A with LB mgt back to supine. Pt reports that she has had a busy day walking with mobility, going off unit for testing and siting up in chair earlier and would just like to rest. OT will continue to follow acutely to maximize level of function and safety      If plan is discharge home, recommend the following:  A little help with walking and/or transfers;A little help with bathing/dressing/bathroom;Assistance with cooking/housework;Assist for transportation;Help with stairs or ramp for entrance   Equipment Recommendations  Other (comment) (LB ADL A/E kit)    Recommendations for Other Services      Precautions / Restrictions Precautions Precautions: Fall Recall of Precautions/Restrictions: Intact Precaution/Restrictions Comments: ileostomy bag, drain Restrictions Weight Bearing Restrictions Per Provider Order: No       Mobility Bed Mobility Overal bed mobility: Needs Assistance Bed Mobility: Sit to Supine, Supine to Sit     Supine to sit: HOB elevated, Used rails, Min assist Sit to supine: Used rails, Min assist   General bed mobility  comments: log roll technique    Transfers Overall transfer level: Needs assistance Equipment used: 1 person hand held assist, Rolling walker (2 wheels) Transfers: Sit to/from Stand Sit to Stand: Contact guard assist     Step pivot transfers: Contact guard assist     General transfer comment: min cues and CGA for sit to stand from EOB, chair and commode     Balance Overall balance assessment: Needs assistance Sitting-balance support: No upper extremity supported, Feet supported Sitting balance-Leahy Scale: Good     Standing balance support: Single extremity supported, During functional activity Standing balance-Leahy Scale: Fair                             ADL either performed or assessed with clinical judgement   ADL Overall ADL's : Needs assistance/impaired     Grooming: Wash/dry hands;Wash/dry face;Contact guard assist;Standing       Lower Body Bathing: Maximal assistance;Sitting/lateral leans       Lower Body Dressing: Maximal assistance;Sitting/lateral leans;Sit to/from stand Lower Body Dressing Details (indicate cue type and reason): socks Toilet Transfer: Contact guard assist;Stand-pivot   Toileting- Clothing Manipulation and Hygiene: Contact guard assist;Sit to/from stand       Functional mobility during ADLs: Contact guard assist;Rolling walker (2 wheels) General ADL Comments: increased time due to pain in abdomen. Pt requires max A for LB ADLs due to pain    Extremity/Trunk Assessment Upper Extremity Assessment Upper Extremity Assessment: Generalized weakness   Lower Extremity Assessment Lower Extremity Assessment: Defer to PT evaluation  Vision Ability to See in Adequate Light: 0 Adequate Patient Visual Report: No change from baseline     Perception     Praxis     Communication Communication Communication: No apparent difficulties   Cognition Arousal: Alert Behavior During Therapy: WFL for tasks  assessed/performed Cognition: No apparent impairments                               Following commands: Intact        Cueing   Cueing Techniques: Verbal cues  Exercises      Shoulder Instructions       General Comments      Pertinent Vitals/ Pain       Pain Assessment Pain Assessment: Faces Faces Pain Scale: Hurts little more Pain Location: abdomen Pain Descriptors / Indicators: Aching, Discomfort, Grimacing, Guarding Pain Intervention(s): Limited activity within patient's tolerance, Monitored during session, Repositioned  Home Living                                          Prior Functioning/Environment              Frequency  Min 2X/week        Progress Toward Goals  OT Goals(current goals can now be found in the care plan section)  Progress towards OT goals: Progressing toward goals  Acute Rehab OT Goals Patient Stated Goal: go home OT Goal Formulation: With patient  Plan      Co-evaluation                 AM-PAC OT 6 Clicks Daily Activity     Outcome Measure   Help from another person eating meals?: None Help from another person taking care of personal grooming?: A Little Help from another person toileting, which includes using toliet, bedpan, or urinal?: A Little Help from another person bathing (including washing, rinsing, drying)?: A Lot Help from another person to put on and taking off regular upper body clothing?: A Little Help from another person to put on and taking off regular lower body clothing?: A Lot 6 Click Score: 17    End of Session Equipment Utilized During Treatment: Gait belt;Rolling walker (2 wheels)  OT Visit Diagnosis: Unsteadiness on feet (R26.81);Other abnormalities of gait and mobility (R26.89);Muscle weakness (generalized) (M62.81);Pain Pain - part of body:  (ABD)   Activity Tolerance Patient limited by pain   Patient Left in bed;with call bell/phone within reach;with  family/visitor present   Nurse Communication Mobility status        Time: 8653-8590 OT Time Calculation (min): 23 min  Charges: OT General Charges $OT Visit: 1 Visit OT Treatments $Self Care/Home Management : 8-22 mins $Therapeutic Activity: 8-22 mins   Jacques Karna Loose 10/24/2023, 2:42 PM

## 2023-10-25 LAB — CREATININE, SERUM
Creatinine, Ser: 1.43 mg/dL — ABNORMAL HIGH (ref 0.44–1.00)
GFR, Estimated: 38 mL/min — ABNORMAL LOW (ref >60)

## 2023-10-25 LAB — POTASSIUM: Potassium: 4.3 mmol/L (ref 3.5–5.1)

## 2023-10-25 NOTE — Care Management Important Message (Signed)
 Important Message  Patient Details  Name: Donna [name] MRN: [id number] Date of Birth: 1945/02/09   Important Message Given:        [name] 10/25/2023, 11:51 AM

## 2023-10-25 NOTE — Progress Notes (Signed)
 Upon assessment of patient,stated, I want to order some lunch I never saw what they had. I reoriented patient that it is 8 pm at night and there was no meal service available at this time. Offered food from floor stock. Patient then stated,  I am so tired, I have not had any sleep while I have been here and this pain is terrible and I haven't gotten any good medicine all day.  Patient given pain medication. Plan of care ongoing.

## 2023-10-25 NOTE — Progress Notes (Signed)
 Physical Therapy Treatment Patient Details Name: Donna [name] Dpwija MRN: [id number] DOB: 25-Mar-1945 Today's Date: 10/25/2023   History of Present Illness 78 yo female presents to therapy following hospitalization on 10/18/2023 due to para-ileostomy hernia. Pt underwent repair of hernia on 10/17.  Pt PMH includes but is not limited to: DOE, fatigue, colon ca s/p colectomy, OA, HTN, GERD, HLD and L TKA.    PT Comments  Pt tolerated increased ambulation distance of 62' with RW, no loss of balance, distance limited by fatigue. Pt performed seated UE/LE strengthening exercises.     If plan is discharge home, recommend the following: A little help with walking and/or transfers;A little help with bathing/dressing/bathroom;Assistance with cooking/housework;Assist for transportation;Help with stairs or ramp for entrance   Can travel by private vehicle        Equipment Recommendations  None recommended by PT    Recommendations for Other Services       Precautions / Restrictions Precautions Precautions: Fall;Other (comment) Recall of Precautions/Restrictions: Intact Precaution/Restrictions Comments: ABDominal binder; ileostomy bag, drain; abdominal surgery Restrictions Weight Bearing Restrictions Per Provider Order: No     Mobility  Bed Mobility Overal bed mobility: Needs Assistance Bed Mobility: Sit to Supine, Supine to Sit     Supine to sit: HOB elevated, Used rails, Min assist Sit to supine: Used rails, Min assist   General bed mobility comments: reviewed log roll technique    Transfers Overall transfer level: Needs assistance Equipment used: Rolling walker (2 wheels) Transfers: Sit to/from Stand Sit to Stand: Min assist, From elevated surface           General transfer comment: verbal cues for hand placement, min A to power up    Ambulation/Gait Ambulation/Gait assistance: Contact guard assist Gait Distance (Feet): 55 Feet Assistive device: Rolling walker (2  wheels) Gait Pattern/deviations: Step-through pattern, Trunk flexed Gait velocity: decreased     General Gait Details: Pt ambulated with RW without loss of balance, pt reported she's had L TKA and has arthritis in L hip, noted some giving way of LLE with weightbearing but no buckling nor loss of balance, pt reported this is baseline and that she's not had any falls in the past 6 months   Stairs             Wheelchair Mobility     Tilt Bed    Modified Rankin (Stroke Patients Only)       Balance Overall balance assessment: Needs assistance Sitting-balance support: No upper extremity supported, Feet supported Sitting balance-Leahy Scale: Good     Standing balance support: Single extremity supported, During functional activity Standing balance-Leahy Scale: Fair                              Hotel manager: No apparent difficulties  Cognition Arousal: Alert Behavior During Therapy: WFL for tasks assessed/performed   PT - Cognitive impairments: No apparent impairments                         Following commands: Intact      Cueing Cueing Techniques: Verbal cues  Exercises General Exercises - Upper Extremity Shoulder Flexion: AROM, Both, 10 reps, Seated General Exercises - Lower Extremity Ankle Circles/Pumps: PROM, Both, 10 reps, Seated Long Arc Quad: AROM, Both, 10 reps, Seated    General Comments        Pertinent Vitals/Pain Pain Assessment Pain Score: 5  Pain Location: abdomen  Pain Descriptors / Indicators: Aching, Discomfort, Grimacing, Guarding Pain Intervention(s): Limited activity within patient's tolerance, Monitored during session, Repositioned, Premedicated before session    Home Living                          Prior Function            PT Goals (current goals can now be found in the care plan section) Acute Rehab PT Goals Patient Stated Goal: to be able to get stronger get rid of  the pain and go home PT Goal Formulation: With patient Time For Goal Achievement: 11/04/23 Potential to Achieve Goals: Good Progress towards PT goals: Progressing toward goals    Frequency    Min 3X/week      PT Plan      Co-evaluation              AM-PAC PT 6 Clicks Mobility   Outcome Measure  Help needed turning from your back to your side while in a flat bed without using bedrails?: A Little Help needed moving from lying on your back to sitting on the side of a flat bed without using bedrails?: A Little Help needed moving to and from a bed to a chair (including a wheelchair)?: A Little Help needed standing up from a chair using your arms (e.g., wheelchair or bedside chair)?: A Little Help needed to walk in hospital room?: A Little Help needed climbing 3-5 steps with a railing? : A Lot 6 Click Score: 17    End of Session Equipment Utilized During Treatment: Gait belt Activity Tolerance: Patient tolerated treatment well;No increased pain Patient left: in bed;with call bell/phone within reach;with bed alarm set Nurse Communication: Mobility status PT Visit Diagnosis: Unsteadiness on feet (R26.81);Pain     Time: 8482-8465 PT Time Calculation (min) (ACUTE ONLY): 17 min  Charges:    $Gait Training: 8-22 mins PT General Charges $$ ACUTE PT VISIT: 1 Visit                     Sylvan Delon Copp PT 10/25/2023  Acute Rehabilitation Services  Office [phone]

## 2023-10-25 NOTE — Care Management Important Message (Signed)
 Important Message  Patient Details  Name: Donna [name] MRN: [id number] Date of Birth: 1945/01/18   Important Message Given:        [name] 10/25/2023, 11:52 AM

## 2023-10-25 NOTE — Progress Notes (Signed)
 10/25/2023  Donna [name] [id number] 04/08/1945  CARE TEAM: PCP: Clarice Nottingham, MD  Outpatient Care Team: Patient Care Team: Clarice Nottingham, MD as PCP - General (Internal Medicine) Elmira Newman PARAS, MD as PCP - Cardiology (Cardiology) Teresa Lonni HERO, MD as Consulting Physician (Colon and Rectal Surgery) Sheldon Standing, MD as Consulting Physician (General Surgery) Donnald Charleston, MD as Consulting Physician (Gastroenterology) Elmira Newman PARAS, MD as Consulting Physician (Cardiology)  Inpatient Treatment Team: Treatment Team:  Sheldon Standing, MD Massie Delaine SAUNDERS, RN Bullins, Powell PARAS, RN Sylvan Delon POUR, PT Arlyss Si DASEN, [place] Kusnitz, Devere POUR, RN Estelle Hunter DEL, RN Wofford, Bard LABOR, RPH Ellie Alfonse SAUNDERS, RN Melvenia Rosina CROME, RN   Problem List:   Principal Problem:   Para-ileostomy hernia [facility]) Active Problems:   Gastroesophageal reflux disease   S/P total colectomy   Recurrent incisional hernia with incarceration   Prediabetes   Obstructive sleep apnea syndrome   Ileostomy present (HCC)   History of ulcerative colitis   History of malignant neoplasm of colon   Stage 3b chronic kidney disease (HCC)   Ulcerative colitis (HCC)   10/18/2023  POST-OPERATIVE DIAGNOSIS:  PARASTOMAL HERNIA AND VENTRAL INCISIONAL RECURRENT AND INCARCERATED ABDOMINAL WALL HERNIAE  Dimensions of hernia post-op:  16cm x 8cm   PROCEDURE:   ROBOTIC COMPONENT SEPARATION (transversus abdominis release = TAR) on right Repair of incarcerated recurrent incisional and parastomal hernias with mesh ROBOTIC LYSIS OF ADHESIONS SEROSAL REPAIR x2 END ILEOSTOMY REVISION TAP BLOCK - BILATERAL   SURGEON:  Standing KYM Sheldon, MD  OR FINDINGS:  Moderately dense adhesions small bowel to anterior abdominal wall and periumbilical midline hernia.  Swiss cheese hernias extending up to the subxiphoid region.  15 cm in length.  Moderate parastomal hernia around end ileostomy with a foot  of small bowel densely adherent to the hernia sac.  Serosal repairs down x 2.  Ileostomy revision done for last 7 cm given poor tissues.   Type of ventral wall repair:  Laparoscopic underlay repair .with Primary repair of largest hernia Placement of mesh: Right lateral and flank retrorectus preperitoneal.  Midline intraperitoneal. Name of mesh: Bard Ventralight dual sided (polypropylene / Seprafilm) Size of mesh: 33x27cm Orientation: Transverse Mesh overlap:  7cm    Assessment [facility] Stay = 7 days) 7 Days Post-Op    Ileus most likely due to edema and kinking at parastomal ileostomy [place] repair     Plan:  Ileus appears to be finally resolving.  Advance diet.  Remove abdominal wall drain since evidence of just chronic seroma and output falling down.  Also the bulb got ripped off for a while, so about infection if leaving in place  Keep her on the dry side to avoid ileus or edema.  To see if we can get some bowel edema down and help her open up.  IV fluid backup just in case.  Multimodal pain control.  -monitor electrolytes & replace as needed  Keep K>4, Mg>2, Phos>3  - GERD.  Continue Pepcid H2 blocker  Sleep apnea.  Oxygen nightly as needed.  -VTE prophylaxis- SCDs.  Anticoagulation prophyllaxis SQ as appropriate  -mobilize as tolerated to help recovery.  Enlist therapies in moderate/high risk patients as appropriate  I updated the patient's status to the patient  Recommendations were made.  Questions were answered.  She expressed understanding & appreciation.  -Disposition:  Disposition:  The patient is from: Home Anticipate discharge to:  Home with Home Health Anticipated Date of Discharge is:  October  74,7974   Barriers to discharge:  Pending Clinical improvement (more likely than not), Therapy assessment & Recommendations pending, and Consultant clearance & sign off    Patient currently is NOT MEDICALLY STABLE for discharge from the hospital from a  surgery standpoint.      I reviewed nursing notes, last 24 h vitals and pain scores, last 48 h intake and output, last 24 h labs and trends, and last 24 h imaging results.  I have reviewed this patient's available data, including medical history, events of note, test results, etc as part of my evaluation.   A significant portion of that time was spent in counseling. Care during the described time interval was provided by me.  This care required moderate level of medical decision making.  10/25/2023    Subjective: (Chief complaint)  Patient with no more nausea.  Daughter in room.  Wanting to get up to chair.  NT helping.  Starting to have diarrhea explosion after drinking contrast.  Denies much pain  Objective:  Vital signs:  Vitals:   10/24/23 0551 10/24/23 1413 10/24/23 2049 10/25/23 0525  BP: (!) 119/58 (!) 147/101 (!) 140/68 (!) 134/104  Pulse: 73 70 73 83  Resp: 16 18 15 15   Temp: 98.7 F (37.1 C) 98.1 F (36.7 C) 99.3 F (37.4 C) 98.6 F (37 C)  TempSrc: Oral     SpO2: 95% 93% 94% 97%  Weight:      Height:        Last BM Date :  (minimal output from ileostomy)  Intake/Output   Yesterday:  10/23 0701 - 10/24 0700 In: 1680 [P.O.:1680] Out: 1735 [Urine:1350; Emesis/NG output:20; Drains:165; Stool:200] This shift:  No intake/output data recorded.  Bowel function:  Flatus: YES  BM:  No  Drain: Serous   Physical Exam:  General: Pt awake/alert in no acute distress.  Calm.  Relaxed.  Bright.  Alert. Eyes: PERRL, normal EOM.  Sclera clear.  No icterus Neuro: CN II-XII intact w/o focal sensory/motor deficits. Lymph: No head/neck/groin lymphadenopathy Psych:  No delerium/psychosis/paranoia.  Oriented x 4 HENT: Normocephalic, Mucus membranes moist.  No thrush Neck: Supple, No tracheal deviation.  No obvious thyromegaly Chest: No pain to chest wall compression.  Good respiratory excursion.  No audible wheezing CV:  Pulses intact.  Regular rhythm.  No  major extremity edema MS: Normal AROM mjr joints.  No obvious deformity  Abdomen: Obese. Soft.  Nondistended.  Mildly tender at incisions only.  No evidence of peritonitis.  No incarcerated hernias. Ileostomy in place with mild edema/ecchymosis but viable.  Scant flatus in bag.  No stool/effluent yet.  Mucosa pink/viable.  Ext:   No deformity.  No mjr edema.  No cyanosis Skin: No petechiae / purpurea.  No major sores.  Warm and dry    Results:   Cultures: No results found for this or any previous visit (from the past 720 hours).  Labs: Results for orders placed or performed during the hospital encounter of 10/18/23 (from the past 48 hours)  Potassium     Status: None   Collection Time: 10/24/23  4:52 AM  Result Value Ref Range   Potassium 4.0 3.5 - 5.1 mmol/L    Comment: Performed at [facility], [address], [place], [place] [address]  Creatinine, serum     Status: Abnormal   Collection Time: 10/24/23  4:52 AM  Result Value Ref Range   Creatinine, Ser 1.52 (H) 0.44 - 1.00 mg/dL   GFR, Estimated  35 (L) >60 mL/min    Comment: (NOTE) Calculated using the CKD-EPI Creatinine Equation (2021) Performed at [facility], [address], [place], [place] [address]   Potassium     Status: None   Collection Time: 10/25/23  4:53 AM  Result Value Ref Range   Potassium 4.3 3.5 - 5.1 mmol/L    Comment: Performed at [facility], [address], [place], [place] [address]  Creatinine, serum     Status: Abnormal   Collection Time: 10/25/23  4:53 AM  Result Value Ref Range   Creatinine, Ser 1.43 (H) 0.44 - 1.00 mg/dL   GFR, Estimated 38 (L) >60 mL/min    Comment: (NOTE) Calculated using the CKD-EPI Creatinine Equation (2021) Performed at [facility], [address], [place], [place] [address]     Imaging / Studies: CT ABDOMEN PELVIS WO CONTRAST Result Date: 10/24/2023 CLINICAL DATA:  Abdominal pain  postop peristomal and incisional hernia repairs. Evaluate for ileus versus small-bowel obstruction. EXAM: CT ABDOMEN AND PELVIS WITHOUT CONTRAST TECHNIQUE: Multidetector CT imaging of the abdomen and pelvis was performed following the standard protocol without IV contrast. RADIATION DOSE REDUCTION: This exam was performed according to the departmental dose-optimization program which includes automated exposure control, adjustment of the mA and/or kV according to patient size and/or use of iterative reconstruction technique. COMPARISON:  Abdominopelvic CT 08/22/2023 and 01/30/2019. FINDINGS: Lower chest: Moderate subsegmental atelectasis at both lung bases. No significant pleural effusion or basilar pneumothorax. There is soft tissue emphysema within the anterior chest wall attributed to the recent surgery. Hepatobiliary: The liver is normal in density without suspicious focal abnormality on noncontrast imaging. Persistent pneumobilia, likely secondary to previous sphincterotomy. Dependent soft tissue in the gallbladder lumen may reflect noncalcified stones or sludge. No gallbladder wall thickening or surrounding inflammation. Pancreas: Unremarkable. No pancreatic ductal dilatation or surrounding inflammatory changes. Spleen: Normal in size without focal abnormality. Adrenals/Urinary Tract: Stable 2.1 cm right adrenal nodule measuring 14 HU this is compatible with an incidental adenoma based on stability from 2021. No specific follow-up imaging recommended. The left adrenal gland appears normal. No evidence of urinary tract calculus, suspicious renal lesion or hydronephrosis. Unchanged left renal cyst for which no specific follow-up imaging is recommended. The bladder is nearly empty and suboptimally evaluated. There is a small amount of air in the bladder lumen, likely secondary to recent instrumentation/catheterization. Stomach/Bowel: Enteric contrast has passed into the mid small bowel. The stomach appears  unremarkable for its degree of distention. Mild diffuse small and large bowel distension with scattered air-fluid levels. No focal transition point identified. Appearance is most consistent with a postoperative ileus. No evidence of contrast leak. Vascular/Lymphatic: There are no enlarged abdominal or pelvic lymph nodes. Aortic and branch vessel atherosclerosis without evidence of aneurysm. Reproductive: The uterus and ovaries appear unremarkable. No adnexal mass. Other: Interval ventral hernia repair with surgical drains in the anterior abdominal wall. There is a small amount of residual ill-defined fluid and air in the right lower quadrant abdominal wall adjacent to the ileostomy. Small left periumbilical air-fluid collection at site of repaired hernia measures 3.4 x 1.6 cm on image 57/2. There is air within the anterior abdominal wall attributed to the interval surgery. No focal intra-fluid collection or pneumoperitoneum identified. Musculoskeletal: No acute or significant osseous findings. Severe convex left thoracolumbar scoliosis with associated multilevel spondylosis. IMPRESSION: 1. Interval ventral hernia repair with surgical drains in the anterior abdominal wall. There is a small amount of residual ill-defined  fluid and air in the right lower quadrant abdominal wall adjacent to the ileostomy. Small left periumbilical air-fluid collection at site of repaired hernia. 2. Mild diffuse small and large bowel distension with scattered air-fluid levels, most consistent with a postoperative ileus. No focal transition point or other signs of bowel obstruction identified. Radiographic follow up recommended. 3. No focal intra-fluid collection or pneumoperitoneum identified. 4. Moderate bibasilar atelectasis. 5.  Aortic Atherosclerosis (ICD10-I70.0). Electronically Signed   By: Elsie Perone M.D.   On: 10/24/2023 16:47    Medications / Allergies: per chart  Antibiotics: Anti-infectives (From admission, onward)     Start     Dose/Rate Route Frequency Ordered Stop   10/18/23 2200  cefoTEtan  (CEFOTAN ) 2 g in sodium chloride  0.9 % 100 mL IVPB  Status:  Discontinued       Note to Pharmacy: Pharmacy may adjust dosing strength, schedule, rate of infusion, etc as needed to optimize therapy (has CKD)   2 g 200 mL/hr over 30 Minutes Intravenous Every 24 hours 10/18/23 1848 10/21/23 0720   10/18/23 1930  ceFAZolin  (ANCEF ) IVPB 2g/100 mL premix        2 g 200 mL/hr over 30 Minutes Intravenous Every 8 hours 10/18/23 1848 10/18/23 2014   10/18/23 0845  ceFAZolin  (ANCEF ) IVPB 2g/100 mL premix       Placed in And Linked Group   2 g 200 mL/hr over 30 Minutes Intravenous On call to O.R. 10/18/23 0839 10/18/23 1122   10/18/23 0845  metroNIDAZOLE  (FLAGYL ) IVPB 500 mg       Placed in And Linked Group   500 mg 100 mL/hr over 60 Minutes Intravenous On call to O.R. 10/18/23 0839 10/18/23 1146         Note: Portions of this report may have been transcribed using voice recognition software. Every effort was made to ensure accuracy; however, inadvertent computerized transcription errors may be present.   Any transcriptional errors that result from this process are unintentional.    Elspeth KYM Schultze, MD, FACS, MASCRS Esophageal, Gastrointestinal & Colorectal Surgery Robotic and Minimally Invasive Surgery  [facility] [address] [place], [place] [address] [phone] Fax [phone] Main  CONTACT INFORMATION: Weekday (9AM-5PM): Call [facility] main office at [phone] Weeknight (5PM-9AM) or Weekend/Holiday: Check EPIC Web Links tab & use AMION (password  [email or web address]) for General Surgery [facility] coverage  Please, DO NOT use SecureChat  (it is not reliable communication to reach operating surgeons & will lead to a delay in care).   Epic staff messaging available for outpatient concerns needing 1-2 business day response.      10/25/2023   8:17 AM

## 2023-10-26 ENCOUNTER — Inpatient Hospital Stay (HOSPITAL_COMMUNITY)

## 2023-10-26 MED ORDER — ENALAPRILAT 1.25 MG/ML IV SOLN: 0.6250 mg | Freq: Four times a day (QID) | INTRAVENOUS | Status: DC | PRN

## 2023-10-26 MED ORDER — SENNA 8.6 MG PO TABS
1.0000 | ORAL_TABLET | Freq: Every day | ORAL | Status: DC
  Administered 2023-10-26: 8.6 mg via ORAL
  Filled 2023-10-26: qty 1

## 2023-10-26 MED ORDER — LACTATED RINGERS IV BOLUS: 1000.0000 mL | Freq: Once | INTRAVENOUS | Status: DC

## 2023-10-26 MED ORDER — METOCLOPRAMIDE HCL 5 MG/ML IJ SOLN
5.0000 mg | Freq: Three times a day (TID) | INTRAMUSCULAR | Status: AC
Start: 2023-10-26 — End: 2023-10-28
  Administered 2023-10-26 – 2023-10-28 (×6): 5 mg via INTRAVENOUS
  Filled 2023-10-26 (×6): qty 2

## 2023-10-26 MED ORDER — FAMOTIDINE IN NACL 20-0.9 MG/50ML-% IV SOLN
20.0000 mg | Freq: Two times a day (BID) | INTRAVENOUS | Status: DC
  Administered 2023-10-26 – 2023-10-30 (×8): 20 mg via INTRAVENOUS
  Filled 2023-10-26 (×10): qty 50

## 2023-10-26 MED ORDER — CALCIUM POLYCARBOPHIL 625 MG PO TABS: 1250.0000 mg | ORAL_TABLET | Freq: Two times a day (BID) | ORAL | Status: DC

## 2023-10-26 MED ORDER — METOPROLOL TARTRATE 5 MG/5ML IV SOLN: 5.0000 mg | Freq: Four times a day (QID) | INTRAVENOUS | Status: DC | PRN

## 2023-10-26 MED ORDER — HYDROMORPHONE HCL 1 MG/ML IJ SOLN: 0.5000 mg | Freq: Four times a day (QID) | INTRAMUSCULAR | Status: DC | PRN

## 2023-10-26 MED ORDER — HYDROMORPHONE HCL 1 MG/ML IJ SOLN
0.5000 mg | INTRAMUSCULAR | Status: DC | PRN
  Administered 2023-10-27 – 2023-10-29 (×7): 1 mg via INTRAVENOUS
  Filled 2023-10-26 (×7): qty 1

## 2023-10-26 MED ORDER — LACTATED RINGERS IV BOLUS
1000.0000 mL | Freq: Once | INTRAVENOUS | Status: AC
  Administered 2023-10-26: 1000 mL via INTRAVENOUS

## 2023-10-26 MED ORDER — SODIUM CHLORIDE 0.9 % IV SOLN: 500.0000 mg | Freq: Three times a day (TID) | INTRAVENOUS | Status: DC

## 2023-10-26 MED ORDER — METOPROLOL TARTRATE 5 MG/5ML IV SOLN
5.0000 mg | Freq: Four times a day (QID) | INTRAVENOUS | Status: DC
  Administered 2023-10-26 – 2023-10-29 (×10): 5 mg via INTRAVENOUS
  Filled 2023-10-26 (×10): qty 5

## 2023-10-26 NOTE — Progress Notes (Signed)
 Verified order in the computer for NG tube placement. Primary  nurse asked this nurse to insert NG tube as ordered. Entered pt's room. Explained procedure to her. Nurse attempted to place 23fr NG into R nostril. Met resistance. Stopped. Placed 58fr NG tube into L nostril with ease. Immediately received golden brown output from NG tube. Advanced tube to 64cm. Clamped NG tube and secured with bridle to her nose. Primary nurse- Leeroy was at bedside during the insertion. Pt tolerated without any complications. Primary nurse- Leeroy is to confirm placement by getting an x-ray.  Primary nurse remains at bedside with the pt.

## 2023-10-26 NOTE — Progress Notes (Signed)
 10/26/2023  Donna [name] [id number] 04-Jan-1945  CARE TEAM: PCP: Clarice Nottingham, MD  Outpatient Care Team: Patient Care Team: Clarice Nottingham, MD as PCP - General (Internal Medicine) Elmira Newman PARAS, MD as PCP - Cardiology (Cardiology) Teresa Lonni HERO, MD as Consulting Physician (Colon and Rectal Surgery) Sheldon Standing, MD as Consulting Physician (General Surgery) Donnald Charleston, MD as Consulting Physician (Gastroenterology) Elmira Newman PARAS, MD as Consulting Physician (Cardiology)  Inpatient Treatment Team: Treatment Team:  Sheldon Standing, MD Massie Delaine SAUNDERS, RN Bullins, Powell PARAS, RN Sebastian Boyer, RN Jerona Lauraine BROCKS, RN Louann Pen, [facility]   Problem List:   Principal Problem:   Para-ileostomy hernia [facility]) Active Problems:   Gastroesophageal reflux disease   S/P total colectomy   Recurrent incisional hernia with incarceration   Prediabetes   Obstructive sleep apnea syndrome   Ileostomy present (HCC)   History of ulcerative colitis   History of malignant neoplasm of colon   Stage 3b chronic kidney disease (HCC)   Ulcerative colitis (HCC)   10/18/2023  POST-OPERATIVE DIAGNOSIS:  PARASTOMAL HERNIA AND VENTRAL INCISIONAL RECURRENT AND INCARCERATED ABDOMINAL WALL HERNIAE  Dimensions of hernia post-op:  16cm x 8cm   PROCEDURE:   ROBOTIC COMPONENT SEPARATION (transversus abdominis release = TAR) on right Repair of incarcerated recurrent incisional and parastomal hernias with mesh ROBOTIC LYSIS OF ADHESIONS SEROSAL REPAIR x2 END ILEOSTOMY REVISION TAP BLOCK - BILATERAL   SURGEON:  Standing KYM Sheldon, MD  OR FINDINGS:  Moderately dense adhesions small bowel to anterior abdominal wall and periumbilical midline hernia.  Swiss cheese hernias extending up to the subxiphoid region.  15 cm in length.  Moderate parastomal hernia around end ileostomy with a foot of small bowel densely adherent to the hernia sac.  Serosal repairs down x 2.  Ileostomy  revision done for last 7 cm given poor tissues.   Type of ventral wall repair:  Laparoscopic underlay repair .with Primary repair of largest hernia Placement of mesh: Right lateral and flank retrorectus preperitoneal.  Midline intraperitoneal. Name of mesh: Bard Ventralight dual sided (polypropylene / Seprafilm) Size of mesh: 33x27cm Orientation: Transverse Mesh overlap:  7cm    Assessment [facility] Stay = 8 days) 8 Days Post-Op    Ileus most likely due to edema and kinking at parastomal ileostomy Sugarbaker repair     Plan:  Ileus appears to be slowly but inconsistently resolving.    Gradually readvance diet  Increase fiber to encourage more consistent bowel function.  Keep her on the dry side to avoid ileus or edema.  To see if we can get some bowel edema down and help her open up.  IV fluid backup just in case.  Multimodal pain control.  -monitor electrolytes & replace as needed  Keep K>4, Mg>2, Phos>3  - GERD.  Continue Pepcid H2 blocker  Sleep apnea.  Oxygen nightly as needed.  -VTE prophylaxis- SCDs.  Anticoagulation prophyllaxis SQ as appropriate  -mobilize as tolerated to help recovery.  Enlist therapies in moderate/high risk patients as appropriate  I updated the patient's status to the patient  Recommendations were made.  Questions were answered.  She expressed understanding & appreciation.  -Disposition:  Disposition:  The patient is from: Home Anticipate discharge to:  Home with Home Health Anticipated Date of Discharge is:  October 27,2025   Barriers to discharge:  Pending Clinical improvement (more likely than not), Therapy assessment & Recommendations pending, and Consultant clearance & sign off    Patient currently is NOT MEDICALLY STABLE for discharge  from the hospital from a surgery standpoint.      I reviewed nursing notes, last 24 h vitals and pain scores, last 48 h intake and output, last 24 h labs and trends, and last 24 h imaging  results.  I have reviewed this patient's available data, including medical history, events of note, test results, etc as part of my evaluation.   A significant portion of that time was spent in counseling. Care during the described time interval was provided by me.  This care required moderate level of medical decision making.  10/26/2023    Subjective: (Chief complaint)  Sitting up in chair.  Denies any nausea or burping/belching.  Passing some gas in the bag but no more bowel movements  Prefers to drink liquids.  Less sore.  Objective:  Vital signs:  Vitals:   10/25/23 1111 10/25/23 2017 10/25/23 2017 10/26/23 0454  BP: (!) 148/81 (!) 141/75 (!) 141/75 (!) 140/69  Pulse: 65 (!) 51 71 76  Resp: 16 15 15 15   Temp:  98.1 F (36.7 C) 98.1 F (36.7 C) 98.8 F (37.1 C)  TempSrc:      SpO2: 97% 97% 100% 98%  Weight:      Height:        Last BM Date :  (very small about of liquid from ostomy)  Intake/Output   Yesterday:  10/24 0701 - 10/25 0700 In: 1060 [P.O.:1060] Out: 850 [Urine:800; Drains:50] This shift:  No intake/output data recorded.  Bowel function:  Flatus: YES  BM:  No  Drain: (No drain)   Physical Exam:  General: Pt awake/alert in no acute distress.  Calm.  Relaxed.  Bright.  Alert. Eyes: PERRL, normal EOM.  Sclera clear.  No icterus Neuro: CN II-XII intact w/o focal sensory/motor deficits. Lymph: No head/neck/groin lymphadenopathy Psych:  No delerium/psychosis/paranoia.  Oriented x 4 HENT: Normocephalic, Mucus membranes moist.  No thrush Neck: Supple, No tracheal deviation.  No obvious thyromegaly Chest: No pain to chest wall compression.  Good respiratory excursion.  No audible wheezing CV:  Pulses intact.  Regular rhythm.  No major extremity edema MS: Normal AROM mjr joints.  No obvious deformity  Abdomen: Obese. Soft.  Nondistended.  Nontender.  No evidence of peritonitis.  No incarcerated hernias. Ileostomy in place with no edema.   Scant thin stool and flatus in bag  Ext:   No deformity.  No mjr edema.  No cyanosis Skin: No petechiae / purpurea.  No major sores.  Warm and dry    Results:   Cultures: No results found for this or any previous visit (from the past 720 hours).  Labs: Results for orders placed or performed during the hospital encounter of 10/18/23 (from the past 48 hours)  Potassium     Status: None   Collection Time: 10/25/23  4:53 AM  Result Value Ref Range   Potassium 4.3 3.5 - 5.1 mmol/L    Comment: Performed at [facility], [address], [place], [place] [address]  Creatinine, serum     Status: Abnormal   Collection Time: 10/25/23  4:53 AM  Result Value Ref Range   Creatinine, Ser 1.43 (H) 0.44 - 1.00 mg/dL   GFR, Estimated 38 (L) >60 mL/min    Comment: (NOTE) Calculated using the CKD-EPI Creatinine Equation (2021) Performed at [facility], [address] [place], [place] [address]     Imaging / Studies: CT ABDOMEN PELVIS WO CONTRAST Result Date: 10/24/2023 CLINICAL DATA:  Abdominal pain  postop peristomal and incisional hernia repairs. Evaluate for ileus versus small-bowel obstruction. EXAM: CT ABDOMEN AND PELVIS WITHOUT CONTRAST TECHNIQUE: Multidetector CT imaging of the abdomen and pelvis was performed following the standard protocol without IV contrast. RADIATION DOSE REDUCTION: This exam was performed according to the departmental dose-optimization program which includes automated exposure control, adjustment of the mA and/or kV according to patient size and/or use of iterative reconstruction technique. COMPARISON:  Abdominopelvic CT 08/22/2023 and 01/30/2019. FINDINGS: Lower chest: Moderate subsegmental atelectasis at both lung bases. No significant pleural effusion or basilar pneumothorax. There is soft tissue emphysema within the anterior chest wall attributed to the recent surgery. Hepatobiliary: The liver is normal in density without  suspicious focal abnormality on noncontrast imaging. Persistent pneumobilia, likely secondary to previous sphincterotomy. Dependent soft tissue in the gallbladder lumen may reflect noncalcified stones or sludge. No gallbladder wall thickening or surrounding inflammation. Pancreas: Unremarkable. No pancreatic ductal dilatation or surrounding inflammatory changes. Spleen: Normal in size without focal abnormality. Adrenals/Urinary Tract: Stable 2.1 cm right adrenal nodule measuring 14 HU this is compatible with an incidental adenoma based on stability from 2021. No specific follow-up imaging recommended. The left adrenal gland appears normal. No evidence of urinary tract calculus, suspicious renal lesion or hydronephrosis. Unchanged left renal cyst for which no specific follow-up imaging is recommended. The bladder is nearly empty and suboptimally evaluated. There is a small amount of air in the bladder lumen, likely secondary to recent instrumentation/catheterization. Stomach/Bowel: Enteric contrast has passed into the mid small bowel. The stomach appears unremarkable for its degree of distention. Mild diffuse small and large bowel distension with scattered air-fluid levels. No focal transition point identified. Appearance is most consistent with a postoperative ileus. No evidence of contrast leak. Vascular/Lymphatic: There are no enlarged abdominal or pelvic lymph nodes. Aortic and branch vessel atherosclerosis without evidence of aneurysm. Reproductive: The uterus and ovaries appear unremarkable. No adnexal mass. Other: Interval ventral hernia repair with surgical drains in the anterior abdominal wall. There is a small amount of residual ill-defined fluid and air in the right lower quadrant abdominal wall adjacent to the ileostomy. Small left periumbilical air-fluid collection at site of repaired hernia measures 3.4 x 1.6 cm on image 57/2. There is air within the anterior abdominal wall attributed to the interval  surgery. No focal intra-fluid collection or pneumoperitoneum identified. Musculoskeletal: No acute or significant osseous findings. Severe convex left thoracolumbar scoliosis with associated multilevel spondylosis. IMPRESSION: 1. Interval ventral hernia repair with surgical drains in the anterior abdominal wall. There is a small amount of residual ill-defined fluid and air in the right lower quadrant abdominal wall adjacent to the ileostomy. Small left periumbilical air-fluid collection at site of repaired hernia. 2. Mild diffuse small and large bowel distension with scattered air-fluid levels, most consistent with a postoperative ileus. No focal transition point or other signs of bowel obstruction identified. Radiographic follow up recommended. 3. No focal intra-fluid collection or pneumoperitoneum identified. 4. Moderate bibasilar atelectasis. 5.  Aortic Atherosclerosis (ICD10-I70.0). Electronically Signed   By: Elsie Perone M.D.   On: 10/24/2023 16:47    Medications / Allergies: per chart  Antibiotics: Anti-infectives (From admission, onward)    Start     Dose/Rate Route Frequency Ordered Stop   10/18/23 2200  cefoTEtan  (CEFOTAN ) 2 g in sodium chloride  0.9 % 100 mL IVPB  Status:  Discontinued       Note to Pharmacy: Pharmacy may adjust dosing strength, schedule, rate of infusion, etc as needed to optimize therapy (has  CKD)   2 g 200 mL/hr over 30 Minutes Intravenous Every 24 hours 10/18/23 1848 10/21/23 0720   10/18/23 1930  ceFAZolin  (ANCEF ) IVPB 2g/100 mL premix        2 g 200 mL/hr over 30 Minutes Intravenous Every 8 hours 10/18/23 1848 10/18/23 2014   10/18/23 0845  ceFAZolin  (ANCEF ) IVPB 2g/100 mL premix       Placed in And Linked Group   2 g 200 mL/hr over 30 Minutes Intravenous On call to O.R. 10/18/23 0839 10/18/23 1122   10/18/23 0845  metroNIDAZOLE  (FLAGYL ) IVPB 500 mg       Placed in And Linked Group   500 mg 100 mL/hr over 60 Minutes Intravenous On call to O.R. 10/18/23  0839 10/18/23 1146         Note: Portions of this report may have been transcribed using voice recognition software. Every effort was made to ensure accuracy; however, inadvertent computerized transcription errors may be present.   Any transcriptional errors that result from this process are unintentional.    Elspeth KYM Schultze, MD, FACS, MASCRS Esophageal, Gastrointestinal & Colorectal Surgery Robotic and Minimally Invasive Surgery  [facility] [address], [address] [place], [place] [address] [phone] Fax [phone] Main  CONTACT INFORMATION: Weekday (9AM-5PM): Call [facility] main office at [phone] Weeknight (5PM-9AM) or Weekend/Holiday: Check EPIC Web Links tab & use AMION (password  [email or web address]) for General Surgery [facility] coverage  Please, DO NOT use SecureChat  (it is not reliable communication to reach operating surgeons & will lead to a delay in care).   Epic staff messaging available for outpatient concerns needing 1-2 business day response.      10/26/2023  8:22 AM

## 2023-10-26 NOTE — Progress Notes (Signed)
 Mobility Specialist - Progress Note   10/26/23 1402  Mobility  Activity Ambulated with assistance  Level of Assistance Contact guard assist, steadying assist  Assistive Device Front wheel walker  Distance Ambulated (ft) 100 ft  Range of Motion/Exercises Active  Activity Response Tolerated well  Mobility Referral Yes  Mobility visit 1 Mobility  Mobility Specialist Start Time (ACUTE ONLY) 1347  Mobility Specialist Stop Time (ACUTE ONLY) 1402  Mobility Specialist Time Calculation (min) (ACUTE ONLY) 15 min   Pt was found in bed and agreeable to mobilize after bathroom use. C/o abdominal pain and requested pain medication. At EOS returned to bed with all needs met. Call bell in reach and family in room. RN notified pt request pain medication.   Donna [name],  Mobility Specialist Can be reached via Secure Chat

## 2023-10-26 NOTE — Plan of Care (Signed)
   Problem: Clinical Measurements: Goal: Diagnostic test results will improve Outcome: Progressing

## 2023-10-26 NOTE — Plan of Care (Signed)
  Problem: Education: Goal: Knowledge of General Education information will improve Description: Including pain rating scale, medication(s)/side effects and non-pharmacologic comfort measures Outcome: Not Progressing   Problem: Coping: Goal: Level of anxiety will decrease Outcome: Not Progressing   Problem: Pain Managment: Goal: General experience of comfort will improve and/or be controlled Outcome: Not Progressing

## 2023-10-27 ENCOUNTER — Inpatient Hospital Stay (HOSPITAL_COMMUNITY)

## 2023-10-27 DIAGNOSIS — Z4682 Encounter for fitting and adjustment of non-vascular catheter: Secondary | ICD-10-CM | POA: Diagnosis not present

## 2023-10-27 LAB — BASIC METABOLIC PANEL WITH GFR
Anion gap: 13 (ref 5–15)
BUN: 28 mg/dL — ABNORMAL HIGH (ref 8–23)
CO2: 27 mmol/L (ref 22–32)
Calcium: 10.2 mg/dL (ref 8.9–10.3)
Chloride: 99 mmol/L (ref 98–111)
Creatinine, Ser: 1.11 mg/dL — ABNORMAL HIGH (ref 0.44–1.00)
GFR, Estimated: 51 mL/min — ABNORMAL LOW (ref >60)
Glucose, Bld: 125 mg/dL — ABNORMAL HIGH (ref 70–99)
Potassium: 3.8 mmol/L (ref 3.5–5.1)
Sodium: 139 mmol/L (ref 135–145)

## 2023-10-27 LAB — CBC
HCT: 30.2 % — ABNORMAL LOW (ref 36.0–46.0)
Hemoglobin: 9.9 g/dL — ABNORMAL LOW (ref 12.0–15.0)
MCH: 29.3 pg (ref 26.0–34.0)
MCHC: 32.8 g/dL (ref 30.0–36.0)
MCV: 89.3 fL (ref 80.0–100.0)
Platelets: 405 K/uL — ABNORMAL HIGH (ref 150–400)
RBC: 3.38 MIL/uL — ABNORMAL LOW (ref 3.87–5.11)
RDW: 14.2 % (ref 11.5–15.5)
WBC: 11.3 K/uL — ABNORMAL HIGH (ref 4.0–10.5)
nRBC: 0 % (ref 0.0–0.2)

## 2023-10-27 LAB — MAGNESIUM: Magnesium: 2 mg/dL (ref 1.7–2.4)

## 2023-10-27 MED ORDER — ACETAMINOPHEN 10 MG/ML IV SOLN
1000.0000 mg | Freq: Four times a day (QID) | INTRAVENOUS | Status: AC
  Administered 2023-10-27 – 2023-10-28 (×4): 1000 mg via INTRAVENOUS
  Filled 2023-10-27 (×4): qty 100

## 2023-10-27 MED ORDER — METHOCARBAMOL 1000 MG/10ML IJ SOLN
1000.0000 mg | Freq: Four times a day (QID) | INTRAMUSCULAR | Status: DC | PRN
  Administered 2023-10-27 (×2): 1000 mg via INTRAVENOUS
  Filled 2023-10-27 (×2): qty 10

## 2023-10-27 MED ORDER — DIATRIZOATE MEGLUMINE & SODIUM 66-10 % PO SOLN
90.0000 mL | Freq: Once | ORAL | Status: AC
  Administered 2023-10-27: 90 mL via NASOGASTRIC
  Filled 2023-10-27: qty 90

## 2023-10-27 MED ORDER — CARMEX CLASSIC LIP BALM EX OINT
TOPICAL_OINTMENT | CUTANEOUS | Status: DC | PRN
  Filled 2023-10-27: qty 10

## 2023-10-27 MED ORDER — ACETAMINOPHEN 500 MG PO TABS
1000.0000 mg | ORAL_TABLET | Freq: Four times a day (QID) | ORAL | Status: DC
  Administered 2023-10-28 – 2023-10-30 (×9): 1000 mg via ORAL
  Filled 2023-10-27 (×10): qty 2

## 2023-10-27 NOTE — Progress Notes (Signed)
 Dr. Sheldon called and ordered to reinsert the NGT.

## 2023-10-27 NOTE — Progress Notes (Signed)
 10/27/2023  Donna [name] [id number] 1945-04-02  CARE TEAM: PCP: Clarice Nottingham, MD  Outpatient Care Team: Patient Care Team: Clarice Nottingham, MD as PCP - General (Internal Medicine) Elmira Newman PARAS, MD as PCP - Cardiology (Cardiology) Teresa Lonni HERO, MD as Consulting Physician (Colon and Rectal Surgery) Sheldon Standing, MD as Consulting Physician (General Surgery) Donnald Charleston, MD as Consulting Physician (Gastroenterology) Elmira Newman PARAS, MD as Consulting Physician (Cardiology)  Inpatient Treatment Team: Treatment Team:  Sheldon Standing, MD Massie Delaine SAUNDERS, RN Bullins, Powell PARAS, RN Sebastian Boyer, RN Arlyss Si DASEN, NT Purgason, Vina NOVAK, RRT Jerona Lauraine BROCKS, RN Fannin, Jt, RN Louann Pen, [facility]   Problem List:   Principal Problem:   Para-ileostomy hernia [facility]) Active Problems:   Gastroesophageal reflux disease   S/P total colectomy   Recurrent incisional hernia with incarceration   Prediabetes   Obstructive sleep apnea syndrome   Ileostomy present (HCC)   History of ulcerative colitis   History of malignant neoplasm of colon   Stage 3b chronic kidney disease (HCC)   Ulcerative colitis (HCC)   10/18/2023  POST-OPERATIVE DIAGNOSIS:  PARASTOMAL HERNIA AND VENTRAL INCISIONAL RECURRENT AND INCARCERATED ABDOMINAL WALL HERNIAE  Dimensions of hernia post-op:  16cm x 8cm   PROCEDURE:   ROBOTIC COMPONENT SEPARATION (transversus abdominis release = TAR) on right Repair of incarcerated recurrent incisional and parastomal hernias with mesh ROBOTIC LYSIS OF ADHESIONS SEROSAL REPAIR x2 END ILEOSTOMY REVISION TAP BLOCK - BILATERAL   SURGEON:  Standing KYM Sheldon, MD  OR FINDINGS:  Moderately dense adhesions small bowel to anterior abdominal wall and periumbilical midline hernia.  Swiss cheese hernias extending up to the subxiphoid region.  15 cm in length.  Moderate parastomal hernia around end ileostomy with a foot of small bowel densely adherent  to the hernia sac.  Serosal repairs down x 2.  Ileostomy revision done for last 7 cm given poor tissues.   Type of ventral wall repair:  Laparoscopic underlay repair .with Primary repair of largest hernia Placement of mesh: Right lateral and flank retrorectus preperitoneal.  Midline intraperitoneal. Name of mesh: Bard Ventralight dual sided (polypropylene / Seprafilm) Size of mesh: 33x27cm Orientation: Transverse Mesh overlap:  7cm    Assessment [facility] Stay = 9 days) 9 Days Post-Op    Ileus most likely due to edema and kinking at parastomal ileostomy Sugarbaker repair     Plan:  Recurrent ileus.  I worry she may be getting a chronic obstruction at the mesh.  Hard to say.  Nasogastric tube decompression.  See if that can calm things down.  I think I will write for small bowel protocol to see if contrast gets to the ileostomy over there if it is a specific kinking off area.  If she does not open up in the next few days, may benefit from returning to the OR with endoscopy of the ileostomy to make sure there is no kinking with possible removal of stitches and relaxation of mesh to break possible obstruction.  Patient would really like to avoid reoperation but hard to know if this will open up conservatively.  We will see.    Has some history of nausea and possible gastroparesis.  Scheduled Reglan .  Normally would do erythromycin but apparently that is not available and do not have a good enteral route.  Related to  Keep her on the dry side to avoid ileus or edema.  To see if we can get some bowel edema down and help her open up.  IV fluid backup just in case.  Multimodal pain control.  -monitor electrolytes & replace as needed  Keep K>4, Mg>2, Phos>3  - GERD.  Continue Pepcid H2 blocker  Sleep apnea.  Oxygen nightly as needed.  -VTE prophylaxis- SCDs.  Anticoagulation prophyllaxis SQ as appropriate  -mobilize as tolerated to help recovery.  Enlist therapies in  moderate/high risk patients as appropriate  I updated the patient's status to the patient  Recommendations were made.  Questions were answered.  She expressed understanding & appreciation.  -Disposition:  Disposition:  The patient is from: Home Anticipate discharge to:  Home with Home Health Anticipated Date of Discharge is:  October 27,2025   Barriers to discharge:  Pending Clinical improvement (more likely than not), Therapy assessment & Recommendations pending, and Consultant clearance & sign off    Patient currently is NOT MEDICALLY STABLE for discharge from the hospital from a surgery standpoint.      I reviewed nursing notes, last 24 h vitals and pain scores, last 48 h intake and output, last 24 h labs and trends, and last 24 h imaging results.  I have reviewed this patient's available data, including medical history, events of note, test results, etc as part of my evaluation.   A significant portion of that time was spent in counseling. Care during the described time interval was provided by me.  This care required moderate level of medical decision making.  10/27/2023    Subjective: (Chief complaint)  Patient with emesis with solid food.  NG tube placed.  She pulled it out.  Replaced.  Patient with less soreness and nausea but disappointed she is not back to normal.  Walking a little bit hallways but prefers to stay in bed  Objective:  Vital signs:  Vitals:   10/26/23 0454 10/26/23 1527 10/26/23 2026 10/27/23 0409  BP: (!) 140/69 (!) 140/68 135/65 (!) 141/64  Pulse: 76 63 90 78  Resp: 15 15 17 14   Temp: 98.8 F (37.1 C) 98.9 F (37.2 C) 99.3 F (37.4 C) 98.6 F (37 C)  TempSrc:  Oral Oral Oral  SpO2: 98% 98% 95% 94%  Weight:      Height:        Last BM Date : 10/25/23  Intake/Output   Yesterday:  10/25 0701 - 10/26 0700 In: 544 [P.O.:490; I.V.:54] Out: 1990 [Urine:900; Emesis/NG output:1090] This shift:  No intake/output data  recorded.  Bowel function:  Flatus: YES  BM:  No  Drain: (No drain)   Physical Exam:  General: Pt awake/alert in no acute distress.  Calm.  Relaxed.  Bright.  Alert. Eyes: PERRL, normal EOM.  Sclera clear.  No icterus Neuro: CN II-XII intact w/o focal sensory/motor deficits. Lymph: No head/neck/groin lymphadenopathy Psych:  No delerium/psychosis/paranoia.  Oriented x 4 HENT: Normocephalic, Mucus membranes moist.  No thrush Neck: Supple, No tracheal deviation.  No obvious thyromegaly Chest: No pain to chest wall compression.  Good respiratory excursion.  No audible wheezing CV:  Pulses intact.  Regular rhythm.  No major extremity edema MS: Normal AROM mjr joints.  No obvious deformity  Abdomen: Obese. Soft.  Nondistended.  Nontender.  No evidence of peritonitis.  No incarcerated hernias. Ileostomy in place with no edema.  Scant thin stool and flatus in bag  Ext:   No deformity.  No mjr edema.  No cyanosis Skin: No petechiae / purpurea.  No major sores.  Warm and dry    Results:   Cultures: No results found for this or  any previous visit (from the past 720 hours).  Labs: No results found for this or any previous visit (from the past 48 hours).   Imaging / Studies: DG Abd Portable 1V Result Date: 10/27/2023 EXAM: 1 VIEW XRAY OF THE ABDOMEN 10/27/2023 03:11:00 AM CLINICAL HISTORY: Encounter for imaging study to confirm nasogastric (NG) tube placement [id number]. NG tube placement COMPARISON: 10/26/2023 FINDINGS: BOWEL: Dilated gas-filled small bowel loops in abdomen up to 4.8 cm diameter, unchanged. Nonobstructive bowel gas pattern. SOFT TISSUES: Enteric tube in place with tip in gastric fundus. BONES: Stable severe levoconvex lumbar scoliosis. No acute osseous abnormality. IMPRESSION: 1. Dilated gas-filled small bowel loops in abdomen up to 4.8 cm diameter, unchanged. 2. Enteric tube in place with tip in gastric fundus. Electronically signed by: Dorethia Molt MD 10/27/2023 03:25 AM  EDT RP Workstation: [id number]   DG Abd 1 View Result Date: 10/26/2023 CLINICAL DATA:  NG tube placement. EXAM: ABDOMEN - 1 VIEW COMPARISON:  CT 10/24/2023 FINDINGS: Tip and side port of the enteric tube in the left upper quadrant, likely within the stomach when compared with prior CT. Gaseous bowel distension centrally. Left the drains are partially included in the field of view. IMPRESSION: Tip and side port of the enteric tube in the left upper quadrant, likely within the stomach. Electronically Signed   By: Andrea Gasman M.D.   On: 10/26/2023 20:51    Medications / Allergies: per chart  Antibiotics: Anti-infectives (From admission, onward)    Start     Dose/Rate Route Frequency Ordered Stop   10/26/23 2200  erythromycin 500 mg in sodium chloride  0.9 % 100 mL IVPB  Status:  Discontinued        500 mg 100 mL/hr over 60 Minutes Intravenous Every 8 hours 10/26/23 1944 10/26/23 2010   10/18/23 2200  cefoTEtan  (CEFOTAN ) 2 g in sodium chloride  0.9 % 100 mL IVPB  Status:  Discontinued       Note to Pharmacy: Pharmacy may adjust dosing strength, schedule, rate of infusion, etc as needed to optimize therapy (has CKD)   2 g 200 mL/hr over 30 Minutes Intravenous Every 24 hours 10/18/23 1848 10/21/23 0720   10/18/23 1930  ceFAZolin  (ANCEF ) IVPB 2g/100 mL premix        2 g 200 mL/hr over 30 Minutes Intravenous Every 8 hours 10/18/23 1848 10/18/23 2014   10/18/23 0845  ceFAZolin  (ANCEF ) IVPB 2g/100 mL premix       Placed in And Linked Group   2 g 200 mL/hr over 30 Minutes Intravenous On call to O.R. 10/18/23 0839 10/18/23 1122   10/18/23 0845  metroNIDAZOLE  (FLAGYL ) IVPB 500 mg       Placed in And Linked Group   500 mg 100 mL/hr over 60 Minutes Intravenous On call to O.R. 10/18/23 0839 10/18/23 1146         Note: Portions of this report may have been transcribed using voice recognition software. Every effort was made to ensure accuracy; however, inadvertent computerized  transcription errors may be present.   Any transcriptional errors that result from this process are unintentional.    Elspeth KYM Schultze, MD, FACS, MASCRS Esophageal, Gastrointestinal & Colorectal Surgery Robotic and Minimally Invasive Surgery  [facility] [address] [place], [place] [address] [phone] Fax [phone] Main  CONTACT INFORMATION: Weekday (9AM-5PM): Call [facility] main office at [phone] Weeknight (5PM-9AM) or Weekend/Holiday: Check EPIC Web Links tab & use AMION (password  [email or web address])  for General Surgery [facility] coverage  Please, DO NOT use SecureChat  (it is not reliable communication to reach operating surgeons & will lead to a delay in care).   Epic staff messaging available for outpatient concerns needing 1-2 business day response.      10/27/2023  8:03 AM

## 2023-10-27 NOTE — Plan of Care (Signed)

## 2023-10-27 NOTE — Progress Notes (Signed)
 Patient pulled out her NGT, Dr. Sheldon notified. with 800 brownish output.

## 2023-10-27 NOTE — Plan of Care (Signed)
   Problem: Clinical Measurements: Goal: Diagnostic test results will improve Outcome: Progressing

## 2023-10-27 NOTE — Progress Notes (Signed)
 Primary nurse asked this nurse to insert NG tube again due to pt pulling out previous NG tube. Entered pt's room. Explained procedure to her again and that she needed to keep it in until the doctor wants to discontinue it. Pt nodded head in understanding and then proceeded to say that she didn't pull the previous one out. Placed 31fr NG tube into L nostril with ease. Advanced tube to 64cm. Clamped NG tube and secured with bridle to her nose. Primary nurse- Leeroy was at bedside during the insertion. Pt tolerated without any complications. Primary nurse- Leeroy is to confirm placement by getting another x-ray.

## 2023-10-28 ENCOUNTER — Other Ambulatory Visit (HOSPITAL_COMMUNITY): Payer: Self-pay

## 2023-10-28 DIAGNOSIS — E876 Hypokalemia: Secondary | ICD-10-CM | POA: Insufficient documentation

## 2023-10-28 LAB — MAGNESIUM: Magnesium: 2 mg/dL (ref 1.7–2.4)

## 2023-10-28 LAB — CBC
HCT: 29.7 % — ABNORMAL LOW (ref 36.0–46.0)
Hemoglobin: 9.8 g/dL — ABNORMAL LOW (ref 12.0–15.0)
MCH: 29.1 pg (ref 26.0–34.0)
MCHC: 33 g/dL (ref 30.0–36.0)
MCV: 88.1 fL (ref 80.0–100.0)
Platelets: 471 K/uL — ABNORMAL HIGH (ref 150–400)
RBC: 3.37 MIL/uL — ABNORMAL LOW (ref 3.87–5.11)
RDW: 14.1 % (ref 11.5–15.5)
WBC: 10.6 K/uL — ABNORMAL HIGH (ref 4.0–10.5)
nRBC: 0 % (ref 0.0–0.2)

## 2023-10-28 LAB — CREATININE, SERUM
Creatinine, Ser: 1.07 mg/dL — ABNORMAL HIGH (ref 0.44–1.00)
Creatinine, Ser: 1.08 mg/dL — ABNORMAL HIGH (ref 0.44–1.00)
GFR, Estimated: 53 mL/min — ABNORMAL LOW (ref >60)
GFR, Estimated: 54 mL/min — ABNORMAL LOW (ref >60)

## 2023-10-28 LAB — POTASSIUM: Potassium: 3.4 mmol/L — ABNORMAL LOW (ref 3.5–5.1)

## 2023-10-28 LAB — PHOSPHORUS: Phosphorus: 2.5 mg/dL (ref 2.5–4.6)

## 2023-10-28 MED ORDER — POTASSIUM CHLORIDE 10 MEQ/100ML IV SOLN
10.0000 meq | INTRAVENOUS | Status: AC
  Administered 2023-10-28 (×4): 10 meq via INTRAVENOUS
  Filled 2023-10-28 (×4): qty 100

## 2023-10-28 MED ORDER — METHOCARBAMOL 1000 MG/10ML IJ SOLN
1000.0000 mg | Freq: Three times a day (TID) | INTRAMUSCULAR | Status: DC
Start: 2023-10-28 — End: 2023-10-31
  Administered 2023-10-28 – 2023-10-30 (×5): 1000 mg via INTRAVENOUS
  Filled 2023-10-28 (×6): qty 10

## 2023-10-28 MED ORDER — MAGNESIUM HYDROXIDE 400 MG/5ML PO SUSP
30.0000 mL | Freq: Two times a day (BID) | ORAL | Status: AC
  Administered 2023-10-28 – 2023-10-30 (×4): 30 mL via ORAL
  Filled 2023-10-28 (×6): qty 30

## 2023-10-28 MED ORDER — SODIUM CHLORIDE 0.9 % IV SOLN: INTRAVENOUS | Status: AC

## 2023-10-28 NOTE — Progress Notes (Signed)
 Occupational Therapy Treatment Patient Details Name: Donna [name] Dpwija MRN: [id number] DOB: 1945-09-09 Today's Date: 10/28/2023   History of present illness 78 yo female presents to therapy following hospitalization on 10/18/2023 due to para-ileostomy hernia. Pt underwent repair of hernia on 10/17.  Pt PMH includes but is not limited to: DOE, fatigue, colon ca s/p colectomy, OA, HTN, GERD, HLD and L TKA.   OT comments  Patient demonstrated progress toward OT goals as evidenced by increased independence in functional mobility and self-care tasks.  Patient demonstrated donning socks with Setup using sock aid and donning shoes with MinA using long shoehorn, with bulk of the assistance provided due to very long length of shoehorn.  Patient demonstrated 10-15 minutes of standing activity tolerance while completing grooming & hygiene tasks at sink.  Patient is still appropriate for home health OT s/p discharge.  Acute OT will continue to follow patient while in the hospital to address performance deficits described below.  Thank you for allowing us  to participate in the care of this patient.      If plan is discharge home, recommend the following:  A little help with walking and/or transfers;A little help with bathing/dressing/bathroom;Assistance with cooking/housework;Assist for transportation;Help with stairs or ramp for entrance   Equipment Recommendations  Other (comment) (Long shoehorn)       Precautions / Restrictions Precautions Precautions: Fall;Other (comment) (Ileostomy; NT tube) Restrictions Weight Bearing Restrictions Per Provider Order: No       Mobility Bed Mobility Overal bed mobility: Needs Assistance Bed Mobility: Sidelying to Sit, Sit to Sidelying   Sidelying to sit: Contact guard assist  Sit to sidelying: Min assist    Transfers Overall transfer level: Needs assistance Equipment used: Rolling walker (2 wheels) Transfers: Sit to/from Stand Sit to Stand: Contact  guard assist     Balance Overall balance assessment: Needs assistance Sitting-balance support: No upper extremity supported, Feet supported Sitting balance-Leahy Scale: Good   Standing balance support: No upper extremity supported, During functional activity Standing balance-Leahy Scale: Fair Standing balance comment: Leaned against sink while completing self-care     ADL either performed or assessed with clinical judgement   ADL Overall ADL's : Needs assistance/impaired Grooming: Wash/dry hands;Wash/dry face;Oral care;Standing;Supervision/safety (at sink) Upper Body Bathing: Supervision/ safety;Standing (at sink) Lower Body Dressing: Minimal assistance;Sitting/lateral leans;With adaptive equipment (Sock aid; long shoehorn)    Extremity/Trunk Assessment Upper Extremity Assessment Upper Extremity Assessment: Defer to OT evaluation   Lower Extremity Assessment Lower Extremity Assessment: Defer to PT evaluation   Cervical / Trunk Assessment Cervical / Trunk Assessment: Normal             Communication Communication Communication: No apparent difficulties   Cognition Arousal: Alert Behavior During Therapy: WFL for tasks assessed/performed   Following commands: Intact                 General Comments Patient demonstrated 10-15 mins standing activity tolerance while completing self-care at sink.    Pertinent Vitals/ Pain       Pain Assessment Pain Assessment: 0-10 Pain Score: 7  Pain Location: Ostomy site Pain Descriptors / Indicators: Discomfort Pain Intervention(s): Monitored during session      Progress Toward Goals  OT Goals(current goals can now be found in the care plan section)  Progress towards OT goals: Progressing toward goals (Functinoal capacity shows improvement)  Acute Rehab OT Goals Patient Stated Goal: Return home OT Goal Formulation: With patient Time For Goal Achievement: 11/04/23 Potential to Achieve Goals: Good  Plan  AM-PAC  OT 6 Clicks Daily Activity     Outcome Measure   Help from another person eating meals?: None Help from another person taking care of personal grooming?: A Little Help from another person toileting, which includes using toliet, bedpan, or urinal?: A Lot Help from another person bathing (including washing, rinsing, drying)?: A Little Help from another person to put on and taking off regular upper body clothing?: A Little Help from another person to put on and taking off regular lower body clothing?: A Lot 6 Click Score: 17    End of Session Equipment Utilized During Treatment: Gait belt;Rolling walker (2 wheels)  OT Visit Diagnosis: Unsteadiness on feet (R26.81);Muscle weakness (generalized) (M62.81);Pain Pain - part of body:  (Abdomen)   Activity Tolerance Patient tolerated treatment well   Patient Left in bed;with call bell/phone within reach;with bed alarm set;with nursing/sitter in room;with family/visitor present   Nurse Communication Other (comment) (Ostomy status)        Time: 8546-8470 OT Time Calculation (min): 36 min  Charges: OT General Charges $OT Visit: 1 Visit OT Treatments $Self Care/Home Management : 23-37 mins  Belvie B. Minah Axelrod, MS, OTR/L 10/28/2023, 3:49 PM

## 2023-10-28 NOTE — Progress Notes (Signed)
 Initial Nutrition Assessment  INTERVENTION:   -Monitor for diet toleration and advancement  -If unable to have diet advanced in next 24 hours, recommend initiation of TPN. -Will be at risk of refeeding syndrome given inadequate PO for 10 days now   -Will need protein supplementation when diet advanced  -Needs updated weight for admission  NUTRITION DIAGNOSIS:   Inadequate oral intake related to nausea, vomiting as evidenced by NPO status/clear liquids.  GOAL:   Patient will meet greater than or equal to 90% of their needs  MONITOR:   PO intake  ASSESSMENT:   78 y.o. patient wit history of ulcerative colitis with early-stage cancer with permanent ileostomy. Presented 10/17 with parastomal hernia around her ileostomy and recurrent midline incisional hernia incarceration. Admitted by surgery for abdominal proctocolectomy.  10/17: admitted, s/p incarcerated recurrent incisional and parastomal hernia repair, LOA, end ileostomy revision 10/18: FLD 10/19: Soft diet 10/20: regular diet, RD provided ileostomy diet edu 10/22: CLD 10/24: FLD 10/25: Soft diet ->NPO  Patient on clears as of this morning and NGT clamped. Pt taking in 75% of lunch tray today. Surgery considering TPN tomorrow if does not improve.  Intakes have varied this admission d/t diet progressions. If diet is further advanced tomorrow, will add supplements. Needs are below if TPN is pursued. Will continue to follow.  NGT placed 10/26, output ~800 ml since this morning.   Per weight records, pt has lost 14 lbs since 4/4, insignificant weight loss. NO new weight since 10/13. Needs to have updated weight for admission.   Medications: OSCAL, calcium carbonate, milk of magnesia, Reglan , Pepcid, KCl  Labs reviewed: Low potassium   Diet Order:   Diet Order             Diet clear liquid Room service appropriate? Yes; Fluid consistency: Thin  Diet effective now           Diet - low sodium heart healthy                    EDUCATION NEEDS:   Education needs have been addressed  Skin:  Skin Assessment: Skin Integrity Issues: Skin Integrity Issues:: Incisions Incisions: abdomen  Last BM:  10/27 -type 6 -ileostomy  Height:   Ht Readings from Last 1 Encounters:  10/18/23 5' 3 (1.6 m)    Weight:   Wt Readings from Last 1 Encounters:  10/18/23 78.9 kg    BMI:  Body mass index is 30.82 kg/m.  Estimated Nutritional Needs:   Kcal:  1550-1750  Protein:  75-90g  Fluid:  1.7L/day   Morna Lee, MS, RD, LDN Inpatient Clinical Dietitian Contact via Secure chat

## 2023-10-28 NOTE — Plan of Care (Signed)

## 2023-10-28 NOTE — Progress Notes (Addendum)
 10/28/2023  Donna [name] [id number] 1945/05/14  CARE TEAM: PCP: Clarice Nottingham, MD  Outpatient Care Team: Patient Care Team: Clarice Nottingham, MD as PCP - General (Internal Medicine) Elmira Newman PARAS, MD as PCP - Cardiology (Cardiology) Teresa Lonni HERO, MD as Consulting Physician (Colon and Rectal Surgery) Sheldon Standing, MD as Consulting Physician (General Surgery) Donnald Charleston, MD as Consulting Physician (Gastroenterology) Elmira Newman PARAS, MD as Consulting Physician (Cardiology)  Inpatient Treatment Team: Treatment Team:  Sheldon Standing, MD Massie Delaine SAUNDERS, RN Bullins, Powell PARAS, RN Girard Belvie NOVAK, OT Ward, Kreg RAMAN, OT Kusnitz, Devere POUR, RN Shade, Wanda BRAVO, RPH Estelle Hunter DEL, RN Victory Tommy RAMAN, Student-LPN Ellie Alfonse SAUNDERS, RN Broussard, Richerd NOVAK, PT Ethel Brynda HERO, RN   Problem List:   Principal Problem:   Para-ileostomy hernia [facility]) Active Problems:   Gastroesophageal reflux disease   S/P total colectomy   Recurrent incisional hernia with incarceration   Prediabetes   Obstructive sleep apnea syndrome   Ileostomy present (HCC)   History of ulcerative colitis   History of malignant neoplasm of colon   Stage 3b chronic kidney disease (HCC)   Ulcerative colitis (HCC)   Hypokalemia   10/18/2023  POST-OPERATIVE DIAGNOSIS:  PARASTOMAL HERNIA AND VENTRAL INCISIONAL RECURRENT AND INCARCERATED ABDOMINAL WALL HERNIAE  Dimensions of hernia post-op:  16cm x 8cm   PROCEDURE:   ROBOTIC COMPONENT SEPARATION (transversus abdominis release = TAR) on right Repair of incarcerated recurrent incisional and parastomal hernias with mesh ROBOTIC LYSIS OF ADHESIONS SEROSAL REPAIR x2 END ILEOSTOMY REVISION TAP BLOCK - BILATERAL   SURGEON:  Standing KYM Sheldon, MD  OR FINDINGS:  Moderately dense adhesions small bowel to anterior abdominal wall and periumbilical midline hernia.  Swiss cheese hernias extending up to the subxiphoid region.  15 cm in  length.  Moderate parastomal hernia around end ileostomy with a foot of small bowel densely adherent to the hernia sac.  Serosal repairs down x 2.  Ileostomy revision done for last 7 cm given poor tissues.   Type of ventral wall repair:  Laparoscopic underlay repair .with Primary repair of largest hernia Placement of mesh: Right lateral and flank retrorectus preperitoneal.  Midline intraperitoneal. Name of mesh: Bard Ventralight dual sided (polypropylene / Seprafilm) Size of mesh: 33x27cm Orientation: Transverse Mesh overlap:  7cm    Assessment [facility] Stay = 10 days) 10 Days Post-Op    Ileus most likely due to edema and kinking at parastomal ileostomy Sugarbaker repair     Plan:  Bowels finally opening up with Gastrografin small bowel follow-through.  NG tube clamping trial with clear liquids.  If tolerates with residuals less than 300 mL every 6 hours x 24 hours, then remove NG tube and advance.  Diarrhea regimen.  Will do some milk of magnesia to encourage things to gently push through since fiber backfired a little bit.    If she does not consistently open up in the next few days, may benefit from returning to the OR with endoscopy of the ileostomy to make sure there is no kinking with possible removal of fascial mesh stitches and relaxation of mesh to break possible obstruction.  Patient would really like to avoid reoperation but hard to know if this will open up conservatively.  We will see.    Has some history of nausea and possible gastroparesis.  Scheduled Reglan .  Normally would do erythromycin but apparently that is not available and do not have a good enteral route.   Keep her on the  dry side to avoid ileus or edema.  To see if we can get some bowel edema down and help her open up.  IV fluid boluses backup just in case.  If she does not consistently open up by tomorrow, do PICC line and start TPN.  Multimodal pain control.  Scheduled methocarbamol  to minimize  narcotics although she should take them if it helps her get out of bed more.  -monitor electrolytes & replace as needed.  Correct hypokalemia.  Will run low dose of saline during that and then go back on the dry side. Keep K>4, Mg>2, Phos>3  - GERD.  Continue Pepcid H2 blocker  Sleep apnea.  Oxygen nightly as needed.  -VTE prophylaxis- SCDs.  Anticoagulation prophyllaxis SQ as appropriate  -mobilize as tolerated to help recovery.  Enlist therapies in moderate/high risk patients as appropriate  I updated the patient's status to the patient  Recommendations were made.  Questions were answered.  She expressed understanding & appreciation.  -Disposition:  Disposition:  The patient is from: Home Anticipate discharge to:  Home with Home Health Anticipated Date of Discharge is:  October 29,2025   Barriers to discharge:  Pending Clinical improvement (more likely than not), Therapy assessment & Recommendations pending, and Consultant clearance & sign off    Patient currently is NOT MEDICALLY STABLE for discharge from the hospital from a surgery standpoint.      I reviewed nursing notes, last 24 h vitals and pain scores, last 48 h intake and output, last 24 h labs and trends, and last 24 h imaging results.  I have reviewed this patient's available data, including medical history, events of note, test results, etc as part of my evaluation.   A significant portion of that time was spent in counseling. Care during the described time interval was provided by me.  This care required moderate level of medical decision making.  10/28/2023    Subjective: (Chief complaint)  Patient with a lot of diarrhea out ileostomy yesterday and this morning.  No more nausea or belching.  Having a lot of flatus now.  Son and nurse in room.  Patient denies much abdominal pain.  No nausea.  Thirsty.  Objective:  Vital signs:  Vitals:   10/27/23 1346 10/27/23 2017 10/28/23 0224 10/28/23 0634  BP:  (!) 150/85 131/62 (!) 160/86 136/72  Pulse: 69 76 64 63  Resp: 16 14 16 14   Temp: 98.3 F (36.8 C) 98 F (36.7 C) 98.2 F (36.8 C)   TempSrc: Oral Oral Oral   SpO2: 97% 96% 93% 97%  Weight:      Height:        Last BM Date : 10/28/23  Intake/Output   Yesterday:  10/26 0701 - 10/27 0700 In: 940 [P.O.:120; I.V.:250; NG/GT:120; IV Piggyback:450] Out: 2900 [Urine:1200; Emesis/NG output:1700] This shift:  Total I/O In: -  Out: 1520 [Stool:1520]  Bowel function:  Flatus: YES  BM:  YES  Drain: (No drain)   Physical Exam:  General: Pt awake/alert in no acute distress.  Calm.  Relaxed.  Bright.  Alert. Eyes: PERRL, normal EOM.  Sclera clear.  No icterus Neuro: CN II-XII intact w/o focal sensory/motor deficits. Lymph: No head/neck/groin lymphadenopathy Psych:  No delerium/psychosis/paranoia.  Oriented x 4 HENT: Normocephalic, Mucus membranes moist.  No thrush Neck: Supple, No tracheal deviation.  No obvious thyromegaly Chest: No pain to chest wall compression.  Good respiratory excursion.  No audible wheezing CV:  Pulses intact.  Regular rhythm.  No major  extremity edema MS: Normal AROM mjr joints.  No obvious deformity  Abdomen: Obese. Soft.  Nondistended.  Nontender.  No evidence of peritonitis.  No incarcerated hernias. Ileostomy in place with no edema.  Scant thin stool and flatus in bag  Ext:   No deformity.  No mjr edema.  No cyanosis Skin: No petechiae / purpurea.  No major sores.  Warm and dry    Results:   Cultures: No results found for this or any previous visit (from the past 720 hours).  Labs: Results for orders placed or performed during the hospital encounter of 10/18/23 (from the past 48 hours)  Basic metabolic panel with GFR     Status: Abnormal   Collection Time: 10/27/23  8:33 AM  Result Value Ref Range   Sodium 139 135 - 145 mmol/L   Potassium 3.8 3.5 - 5.1 mmol/L   Chloride 99 98 - 111 mmol/L   CO2 27 22 - 32 mmol/L   Glucose, Bld 125 (H)  70 - 99 mg/dL    Comment: Glucose reference range applies only to samples taken after fasting for at least 8 hours.   BUN 28 (H) 8 - 23 mg/dL   Creatinine, Ser 8.88 (H) 0.44 - 1.00 mg/dL   Calcium 89.7 8.9 - 89.6 mg/dL   GFR, Estimated 51 (L) >60 mL/min    Comment: (NOTE) Calculated using the CKD-EPI Creatinine Equation (2021)    Anion gap 13 5 - 15    Comment: Performed at [facility], [address] [place], [place] [address]  Magnesium      Status: None   Collection Time: 10/27/23  8:33 AM  Result Value Ref Range   Magnesium  2.0 1.7 - 2.4 mg/dL    Comment: Performed at [facility], [address], [place], [place] [address]  CBC     Status: Abnormal   Collection Time: 10/27/23  8:33 AM  Result Value Ref Range   WBC 11.3 (H) 4.0 - 10.5 K/uL   RBC 3.38 (L) 3.87 - 5.11 MIL/uL   Hemoglobin 9.9 (L) 12.0 - 15.0 g/dL   HCT 69.7 (L) 63.9 - 53.9 %   MCV 89.3 80.0 - 100.0 fL   MCH 29.3 26.0 - 34.0 pg   MCHC 32.8 30.0 - 36.0 g/dL   RDW 85.7 88.4 - 84.4 %   Platelets 405 (H) 150 - 400 K/uL   nRBC 0.0 0.0 - 0.2 %    Comment: Performed at [facility], [address] [place], [place] [address]  Creatinine, serum     Status: Abnormal   Collection Time: 10/27/23 11:59 PM  Result Value Ref Range   Creatinine, Ser 1.08 (H) 0.44 - 1.00 mg/dL   GFR, Estimated 53 (L) >60 mL/min    Comment: (NOTE) Calculated using the CKD-EPI Creatinine Equation (2021) Performed at [facility], [address], [place], [place] [address]   CBC     Status: Abnormal   Collection Time: 10/28/23  5:01 AM  Result Value Ref Range   WBC 10.6 (H) 4.0 - 10.5 K/uL   RBC 3.37 (L) 3.87 - 5.11 MIL/uL   Hemoglobin 9.8 (L) 12.0 - 15.0 g/dL   HCT 70.2 (L) 63.9 - 53.9 %   MCV 88.1 80.0 - 100.0 fL   MCH 29.1 26.0 - 34.0 pg   MCHC 33.0 30.0 - 36.0 g/dL   RDW 85.8 88.4 - 84.4 %   Platelets 471 (H) 150 - 400 K/uL  nRBC 0.0 0.0 - 0.2 %     Comment: Performed at [facility], [address], [place], [place] [address]  Potassium     Status: Abnormal   Collection Time: 10/28/23  5:01 AM  Result Value Ref Range   Potassium 3.4 (L) 3.5 - 5.1 mmol/L    Comment: Performed at [facility], [address], [place], [place] [address]  Creatinine, serum     Status: Abnormal   Collection Time: 10/28/23  5:01 AM  Result Value Ref Range   Creatinine, Ser 1.07 (H) 0.44 - 1.00 mg/dL   GFR, Estimated 54 (L) >60 mL/min    Comment: (NOTE) Calculated using the CKD-EPI Creatinine Equation (2021) Performed at [facility], [address], [place], [place] [address]      Imaging / Studies: DG Abd Portable 1V-Small Bowel Obstruction Protocol-initial, 8 hr delay Result Date: 10/27/2023 EXAM: CT ABDOMEN WITHOUT CONTRAST 10/27/2023 08:14:00 PM TECHNIQUE: CT of the abdomen was performed without the administration of intravenous contrast. Multiplanar reformatted images are provided for review. Automated exposure control, iterative reconstruction, and/or weight-based adjustment of the mA/kV was utilized to reduce the radiation dose to as low as reasonably achievable. COMPARISON: CT examination of October 24, 2023. CLINICAL HISTORY: Small bowel obstruction FINDINGS: LOWER CHEST: Visualized portion of the lower chest demonstrates no acute abnormality. HEPATOBILIARY: The visualized liver is unremarkable. Gallbladder is unremarkable. SPLEEN: Spleen demonstrates no acute abnormality. PANCREAS: Pancreas demonstrates no acute abnormality. ADRENAL GLANDS: Adrenal glands demonstrate no acute abnormality. KIDNEYS: No stones in the kidneys or proximal ureters. No hydronephrosis. No perinephric or periureteral stranding. GI AND BOWEL: A nasogastric tube is seen with loops within the expected gastric fundus. Numerous loops of dilated small bowel are seen throughout the abdomen, in keeping with a distal small bowel  obstruction or adynamic ileus. Administered oral contrast opacifies the numerous loops of dilated small bowel but does not clearly extend to the terminal small bowel seen within the right lower quadrant, in the region of the ileostomy. PERITONEUM AND RETROPERITONEUM: No ascites or free air. Aorta is normal in caliber. LYMPH NODES: No lymphadenopathy. BONES AND SOFT TISSUES: Severe thoracolumbar levoscoliosis is noted. No acute abnormality of the visualized bones. No focal soft tissue abnormality. IMPRESSION: 1. Distal small bowel obstruction versus adynamic ileus, with oral contrast opacifying dilated small bowel loops but not clearly reaching the terminal small bowel in the right lower quadrant near the ileostomy; correlate clinically and consider serial abdominal radiographs or repeat CT for progression. 2. Enteric tube terminates in the gastric fundus. 3. Severe thoracolumbar levoscoliosis again noted. Electronically signed by: Dorethia Molt MD 10/27/2023 08:19 PM EDT RP Workstation: [id number]   DG Abd Portable 1V Result Date: 10/27/2023 EXAM: 1 VIEW XRAY OF THE ABDOMEN 10/27/2023 03:11:00 AM CLINICAL HISTORY: Encounter for imaging study to confirm nasogastric (NG) tube placement [id number]. NG tube placement COMPARISON: 10/26/2023 FINDINGS: BOWEL: Dilated gas-filled small bowel loops in abdomen up to 4.8 cm diameter, unchanged. Nonobstructive bowel gas pattern. SOFT TISSUES: Enteric tube in place with tip in gastric fundus. BONES: Stable severe levoconvex lumbar scoliosis. No acute osseous abnormality. IMPRESSION: 1. Dilated gas-filled small bowel loops in abdomen up to 4.8 cm diameter, unchanged. 2. Enteric tube in place with tip in gastric fundus. Electronically signed by: Dorethia Molt MD 10/27/2023 03:25 AM EDT RP Workstation: [id number]   DG Abd 1 View Result Date: 10/26/2023 CLINICAL DATA:  NG tube placement. EXAM: ABDOMEN - 1 VIEW COMPARISON:  CT 10/24/2023 FINDINGS:  Tip and side port of the enteric  tube in the left upper quadrant, likely within the stomach when compared with prior CT. Gaseous bowel distension centrally. Left the drains are partially included in the field of view. IMPRESSION: Tip and side port of the enteric tube in the left upper quadrant, likely within the stomach. Electronically Signed   By: Andrea Gasman M.D.   On: 10/26/2023 20:51    Medications / Allergies: per chart  Antibiotics: Anti-infectives (From admission, onward)    Start     Dose/Rate Route Frequency Ordered Stop   10/26/23 2200  erythromycin 500 mg in sodium chloride  0.9 % 100 mL IVPB  Status:  Discontinued        500 mg 100 mL/hr over 60 Minutes Intravenous Every 8 hours 10/26/23 1944 10/26/23 2010   10/18/23 2200  cefoTEtan  (CEFOTAN ) 2 g in sodium chloride  0.9 % 100 mL IVPB  Status:  Discontinued       Note to Pharmacy: Pharmacy may adjust dosing strength, schedule, rate of infusion, etc as needed to optimize therapy (has CKD)   2 g 200 mL/hr over 30 Minutes Intravenous Every 24 hours 10/18/23 1848 10/21/23 0720   10/18/23 1930  ceFAZolin  (ANCEF ) IVPB 2g/100 mL premix        2 g 200 mL/hr over 30 Minutes Intravenous Every 8 hours 10/18/23 1848 10/18/23 2014   10/18/23 0845  ceFAZolin  (ANCEF ) IVPB 2g/100 mL premix       Placed in And Linked Group   2 g 200 mL/hr over 30 Minutes Intravenous On call to O.R. 10/18/23 0839 10/18/23 1122   10/18/23 0845  metroNIDAZOLE  (FLAGYL ) IVPB 500 mg       Placed in And Linked Group   500 mg 100 mL/hr over 60 Minutes Intravenous On call to O.R. 10/18/23 0839 10/18/23 1146         Note: Portions of this report may have been transcribed using voice recognition software. Every effort was made to ensure accuracy; however, inadvertent computerized transcription errors may be present.   Any transcriptional errors that result from this process are unintentional.    Elspeth KYM Schultze, MD, FACS, MASCRS Esophageal, Gastrointestinal & Colorectal Surgery Robotic  and Minimally Invasive Surgery  [facility] [address], [address] [place], [place] [address] [phone] Fax [phone] Main  CONTACT INFORMATION: Weekday (9AM-5PM): Call [facility] main office at [phone] Weeknight (5PM-9AM) or Weekend/Holiday: Check EPIC Web Links tab & use AMION (password  [email or web address]) for General Surgery [facility] coverage  Please, DO NOT use SecureChat  (it is not reliable communication to reach operating surgeons & will lead to a delay in care).   Epic staff messaging available for outpatient concerns needing 1-2 business day response.      10/28/2023  8:52 AM

## 2023-10-28 NOTE — Progress Notes (Signed)
 Physical Therapy Treatment Patient Details Name: Donna [name] MRN: [id number] DOB: Jun 28, 1945 Today's Date: 10/28/2023   History of Present Illness 78 yo female presents to therapy following hospitalization on 10/18/2023 due to para-ileostomy hernia. Pt underwent repair of hernia on 10/17.  Pt PMH includes but is not limited to: DOE, fatigue, colon ca s/p colectomy, OA, HTN, GERD, HLD and L TKA.    PT Comments  Pt in recliner, agreeable to amb. Pt amb 180 ft with RW, step through gait pattern with slight L knee buckling/hip drop without full loss of balance or near fall, stopping every ~30 ft to rest my knee then continues ambulating in hallway. Upon return to room, pt into restroom to void bladder, cues for hand placement with transfer off toilet, supv to complete pericare in standing without LOB. Pt returns to supine assisting BLE, semi log roll technique, CGA. Pt without increase in pain with ambulation, but does request pain medication at end of session. Pt reports son lives with her and good family support; continue to recommend HHPT at d/c.    If plan is discharge home, recommend the following: A little help with walking and/or transfers;A little help with bathing/dressing/bathroom;Assistance with cooking/housework;Assist for transportation;Help with stairs or ramp for entrance   Can travel by private vehicle        Equipment Recommendations  None recommended by PT    Recommendations for Other Services       Precautions / Restrictions Precautions Precautions: Fall;Other (comment) Recall of Precautions/Restrictions: Intact Precaution/Restrictions Comments: ileostomy bag, NG Restrictions Weight Bearing Restrictions Per Provider Order: No     Mobility  Bed Mobility   Bed Mobility: Sit to Supine       Sit to supine: Contact guard assist, Used rails   General bed mobility comments: increased time, able to lift BLE back into bed, therapist providing cues as needed,  CGA to complete    Transfers Overall transfer level: Needs assistance Equipment used: Rolling walker (2 wheels) Transfers: Sit to/from Stand Sit to Stand: Contact guard assist           General transfer comment: CGA to power to stand from recliner and toilet, cues for hand placement    Ambulation/Gait Ambulation/Gait assistance: Contact guard assist Gait Distance (Feet): 180 Feet Assistive device: Rolling walker (2 wheels) Gait Pattern/deviations: Step-through pattern, Trunk flexed, Decreased stride length Gait velocity: decreased     General Gait Details: step through gait pattern, slight L knee buckling and hip drop noted in stance phase without full LOB or near fall, trunk slightly forward flexed, pt taking standing rest break every ~30 ft to rest my knee, trunk slightly forward flexed, denies increase in pain   Stairs             Wheelchair Mobility     Tilt Bed    Modified Rankin (Stroke Patients Only)       Balance Overall balance assessment: Needs assistance Sitting-balance support: No upper extremity supported, Feet supported Sitting balance-Leahy Scale: Good     Standing balance support: Single extremity supported, During functional activity Standing balance-Leahy Scale: Fair Standing balance comment: static able to release BUE, dynamic with RW                            Communication Communication Communication: No apparent difficulties  Cognition Arousal: Alert Behavior During Therapy: WFL for tasks assessed/performed   PT - Cognitive impairments: No apparent impairments  Following commands: Intact      Cueing Cueing Techniques: Verbal cues  Exercises      General Comments        Pertinent Vitals/Pain Pain Assessment Pain Assessment: 0-10 Pain Score: 4  Pain Location: abdomen Pain Descriptors / Indicators: Aching, Discomfort, Sore Pain Intervention(s): Limited activity within  patient's tolerance, Monitored during session, Repositioned, Patient requesting pain meds-RN notified    Home Living                          Prior Function            PT Goals (current goals can now be found in the care plan section) Acute Rehab PT Goals Patient Stated Goal: to be able to get stronger get rid of the pain and go home PT Goal Formulation: With patient Time For Goal Achievement: 11/04/23 Potential to Achieve Goals: Good Progress towards PT goals: Progressing toward goals    Frequency    Min 3X/week      PT Plan      Co-evaluation              AM-PAC PT 6 Clicks Mobility   Outcome Measure  Help needed turning from your back to your side while in a flat bed without using bedrails?: A Little Help needed moving from lying on your back to sitting on the side of a flat bed without using bedrails?: A Little Help needed moving to and from a bed to a chair (including a wheelchair)?: A Little Help needed standing up from a chair using your arms (e.g., wheelchair or bedside chair)?: A Little Help needed to walk in hospital room?: A Little Help needed climbing 3-5 steps with a railing? : A Lot 6 Click Score: 17    End of Session Equipment Utilized During Treatment: Gait belt Activity Tolerance: Patient tolerated treatment well Patient left: in bed;with call bell/phone within reach;with bed alarm set Nurse Communication: Mobility status;Patient requests pain meds PT Visit Diagnosis: Unsteadiness on feet (R26.81);Pain Pain - part of body:  (abdomen)     Time: 8775-8752 PT Time Calculation (min) (ACUTE ONLY): 23 min  Charges:    $Gait Training: 8-22 mins $Therapeutic Activity: 8-22 mins PT General Charges $$ ACUTE PT VISIT: 1 Visit                     Tori Mykiah Schmuck PT, DPT 10/28/23, 1:00 PM

## 2023-10-29 LAB — POTASSIUM: Potassium: 3.8 mmol/L (ref 3.5–5.1)

## 2023-10-29 LAB — CREATININE, SERUM
Creatinine, Ser: 1.05 mg/dL — ABNORMAL HIGH (ref 0.44–1.00)
GFR, Estimated: 55 mL/min — ABNORMAL LOW (ref >60)

## 2023-10-29 MED ORDER — METOPROLOL TARTRATE 12.5 MG HALF TABLET
12.5000 mg | ORAL_TABLET | Freq: Two times a day (BID) | ORAL | Status: DC
  Administered 2023-10-29 (×2): 12.5 mg via ORAL
  Filled 2023-10-29 (×2): qty 1

## 2023-10-29 MED ORDER — METOCLOPRAMIDE HCL 5 MG PO TABS
5.0000 mg | ORAL_TABLET | Freq: Three times a day (TID) | ORAL | Status: DC
Start: 2023-10-29 — End: 2023-11-03
  Administered 2023-10-30 – 2023-10-31 (×5): 5 mg via ORAL
  Filled 2023-10-29 (×6): qty 1

## 2023-10-29 MED ORDER — SODIUM CHLORIDE 0.9% FLUSH
3.0000 mL | Freq: Two times a day (BID) | INTRAVENOUS | Status: DC
  Administered 2023-10-30 – 2023-10-31 (×3): 3 mL via INTRAVENOUS

## 2023-10-29 MED ORDER — ENSURE SURGERY PO LIQD
237.0000 mL | Freq: Two times a day (BID) | ORAL | Status: DC
  Administered 2023-10-29 – 2023-10-31 (×3): 237 mL via ORAL

## 2023-10-29 MED ORDER — HYDROMORPHONE HCL 1 MG/ML IJ SOLN
0.5000 mg | INTRAMUSCULAR | Status: DC | PRN
  Administered 2023-10-29 – 2023-10-30 (×2): 1 mg via INTRAVENOUS
  Filled 2023-10-29 (×4): qty 1

## 2023-10-29 MED ORDER — TRAMADOL HCL 50 MG PO TABS
50.0000 mg | ORAL_TABLET | Freq: Four times a day (QID) | ORAL | Status: DC | PRN
  Administered 2023-10-29 – 2023-10-30 (×3): 100 mg via ORAL
  Filled 2023-10-29 (×3): qty 2

## 2023-10-29 MED ORDER — OXYCODONE HCL 5 MG PO TABS
5.0000 mg | ORAL_TABLET | Freq: Once | ORAL | Status: AC
  Administered 2023-10-29: 5 mg via ORAL
  Filled 2023-10-29: qty 1

## 2023-10-29 MED ORDER — SODIUM CHLORIDE 0.9 % IV SOLN: 250.0000 mL | INTRAVENOUS | Status: DC | PRN

## 2023-10-29 MED ORDER — SODIUM CHLORIDE 0.9% FLUSH: 3.0000 mL | INTRAVENOUS | Status: DC | PRN

## 2023-10-29 MED ORDER — LACTATED RINGERS IV BOLUS: 1000.0000 mL | Freq: Three times a day (TID) | INTRAVENOUS | Status: AC | PRN

## 2023-10-29 MED ORDER — CALCIUM POLYCARBOPHIL 625 MG PO TABS
625.0000 mg | ORAL_TABLET | Freq: Two times a day (BID) | ORAL | Status: DC
  Administered 2023-10-29 (×2): 625 mg via ORAL
  Filled 2023-10-29 (×2): qty 1

## 2023-10-29 MED ORDER — ACETAMINOPHEN 325 MG PO TABS: 325.0000 mg | ORAL_TABLET | Freq: Four times a day (QID) | ORAL | Status: DC | PRN

## 2023-10-29 NOTE — Progress Notes (Signed)
 Mobility Specialist Progress Note:   10/29/23 1008  Mobility  Activity Ambulated with assistance  Level of Assistance Contact guard assist, steadying assist  Assistive Device Front wheel walker  Distance Ambulated (ft) 165 ft  Activity Response Tolerated well  Mobility Referral Yes  Mobility visit 1 Mobility  Mobility Specialist Start Time (ACUTE ONLY) 0930  Mobility Specialist Stop Time (ACUTE ONLY) 0955  Mobility Specialist Time Calculation (min) (ACUTE ONLY) 25 min   Pt was received in bathroom and agreed to mobility. C/o abdominal pain during ambulation. Returned to recliner with all needs met. Call bell in reach.  [employer] - Mobility Specialist

## 2023-10-29 NOTE — Progress Notes (Signed)
 10/29/2023  Donna [name] [id number] [birth year]  CARE TEAM: PCP: Clarice Nottingham, MD  Outpatient Care Team: Patient Care Team: Clarice Nottingham, MD as PCP - General (Internal Medicine) Elmira Newman PARAS, MD as PCP - Cardiology (Cardiology) Teresa Lonni HERO, MD as Consulting Physician (Colon and Rectal Surgery) Sheldon Standing, MD as Consulting Physician (General Surgery) Donnald Charleston, MD as Consulting Physician (Gastroenterology) Elmira Newman PARAS, MD as Consulting Physician (Cardiology)  Inpatient Treatment Team: Treatment Team:  Sheldon Standing, MD Massie Delaine SAUNDERS, RN Bullins, Powell PARAS, RN Ward, Kreg RAMAN, OT Arlyss Si DASEN, [place] Kusnitz, Devere POUR, RN Shade, Wanda BRAVO, RPH Estelle Hunter DEL, RN Ellie Alfonse SAUNDERS, RN Cloud, Ole SAILOR   Problem List:   Principal Problem:   Para-ileostomy hernia [facility]) Active Problems:   Gastroesophageal reflux disease   S/P total colectomy   Recurrent incisional hernia with incarceration   Prediabetes   Obstructive sleep apnea syndrome   Ileostomy present (HCC)   History of ulcerative colitis   History of malignant neoplasm of colon   Stage 3b chronic kidney disease (HCC)   Ulcerative colitis (HCC)   Hypokalemia   10/18/2023  POST-OPERATIVE DIAGNOSIS:  PARASTOMAL HERNIA AND VENTRAL INCISIONAL RECURRENT AND INCARCERATED ABDOMINAL WALL HERNIAE  Dimensions of hernia post-op:  16cm x 8cm   PROCEDURE:   ROBOTIC COMPONENT SEPARATION (transversus abdominis release = TAR) on right Repair of incarcerated recurrent incisional and parastomal hernias with mesh ROBOTIC LYSIS OF ADHESIONS SEROSAL REPAIR x2 END ILEOSTOMY REVISION TAP BLOCK - BILATERAL   SURGEON:  Standing KYM Sheldon, MD  OR FINDINGS:  Moderately dense adhesions small bowel to anterior abdominal wall and periumbilical midline hernia.  Swiss cheese hernias extending up to the subxiphoid region.  15 cm in length.  Moderate parastomal hernia around end ileostomy with  a foot of small bowel densely adherent to the hernia sac.  Serosal repairs down x 2.  Ileostomy revision done for last 7 cm given poor tissues.   Type of ventral wall repair:  Laparoscopic underlay repair .with Primary repair of largest hernia Placement of mesh: Right lateral and flank retrorectus preperitoneal.  Midline intraperitoneal. Name of mesh: Bard Ventralight dual sided (polypropylene / Seprafilm) Size of mesh: 33x27cm Orientation: Transverse Mesh overlap:  7cm    Assessment [facility] Stay = 11 days) 11 Days Post-Op    Ileus most likely due to edema and kinking at parastomal ileostomy Sugarbaker repair   Finally consistently opening up much better  Plan:  Tolerated NG tube clamping trial.  I removed it.  Full liquid diet.  If does well without today, will advance to soft diet.  Would like to go slowly with her since she has backfired a few times already.  Restart soluble fiber but continue some milk of magnesia just in case.  If she develops chronic diarrhea, will back off on that and do usual antidiarrheal regimen with most people need with an end ileostomy.  We will see.  If she does not consistently open up in the next few days, may benefit from returning to the OR with endoscopy of the ileostomy to make sure there is no kinking with possible removal of fascial mesh stitches and relaxation of mesh to break possible obstruction.  Patient would really like to avoid reoperation but hard to know if this will open up conservatively.  Hopefully less likely.  We will see.    Has some history of nausea and possible gastroparesis.  Scheduled Reglan .  Normally would do erythromycin but apparently  that is not available and do not have a good enteral route.   Keep her on the dry side to avoid ileus or edema.  To see if we can get some bowel edema down and help her open up.  IV fluid boluses backup just in case.  Pain control.  Will add some oral regimens so she is less dependent on  the IV Dilaudid .  Continue IV Robaxin  for now.  If she does not consistently open up by tomorrow, do PICC line and start TPN.  Multimodal pain control.  Scheduled methocarbamol  to minimize narcotics although she should take them if it helps her get out of bed more.  -monitor electrolytes & replace as needed.  Corrected hypokalemia.  Keep K>4, Mg>2, Phos>3  - GERD.  Continue Pepcid H2 blocker  Sleep apnea.  Oxygen nightly as needed.  -VTE prophylaxis- SCDs.  Anticoagulation prophyllaxis SQ as appropriate  -mobilize as tolerated to help recovery.  Enlist therapies in moderate/high risk patients as appropriate  I updated the patient's status to the patient  Recommendations were made.  Questions were answered.  She expressed understanding & appreciation.  -Disposition:  Disposition:  The patient is from: Home Anticipate discharge to:  Home with Home Health Anticipated Date of Discharge is:  October 29,2025   Barriers to discharge:  Pending Clinical improvement (more likely than not), Therapy assessment & Recommendations pending, and Consultant clearance & sign off    Patient currently is NOT MEDICALLY STABLE for discharge from the hospital from a surgery standpoint.      I reviewed nursing notes, last 24 h vitals and pain scores, last 48 h intake and output, last 24 h labs and trends, and last 24 h imaging results.  I have reviewed this patient's available data, including medical history, events of note, test results, etc as part of my evaluation.   A significant portion of that time was spent in counseling. Care during the described time interval was provided by me.  This care required moderate level of medical decision making.  10/29/2023    Subjective: (Chief complaint)  Patient with large-volume diarrhea yesterday.  Feels less distended and much better overall.  Thirsty.  Tolerated clear liquids low residuals.  Nurse Devere in room.  Objective:  Vital  signs:  Vitals:   10/28/23 1300 10/28/23 2106 10/29/23 0500 10/29/23 0637  BP: (!) 140/68 (!) 154/64  131/62  Pulse: 66 87  73  Resp:  14  16  Temp:  98.4 F (36.9 C)  98.4 F (36.9 C)  TempSrc:  Oral  Oral  SpO2: 99% 99%  98%  Weight:   79.5 kg   Height:        Last BM Date : 10/28/23  Intake/Output   Yesterday:  10/27 0701 - 10/28 0700 In: 2140.8 [P.O.:940; I.V.:809.3; IV Piggyback:391.6] Out: 4520 [Urine:300; Stool:4220] This shift:  No intake/output data recorded.  Bowel function:  Flatus: YES  BM:  YES  Drain: (No drain)   Physical Exam:  General: Pt awake/alert in no acute distress.  Calm.  Relaxed.  Bright.  Alert. Eyes: PERRL, normal EOM.  Sclera clear.  No icterus Neuro: CN II-XII intact w/o focal sensory/motor deficits. Lymph: No head/neck/groin lymphadenopathy Psych:  No delerium/psychosis/paranoia.  Oriented x 4 HENT: Normocephalic, Mucus membranes moist.  No thrush Neck: Supple, No tracheal deviation.  No obvious thyromegaly Chest: No pain to chest wall compression.  Good respiratory excursion.  No audible wheezing CV:  Pulses intact.  Regular rhythm.  No major extremity edema MS: Normal AROM mjr joints.  No obvious deformity  Abdomen: Obese. Soft.  Nondistended.  Nontender.  No evidence of peritonitis.  No incarcerated hernias. Ileostomy in place with no edema.  Large volume of oatmeal consistency stool and flatus in bag  Ext:   No deformity.  No mjr edema.  No cyanosis Skin: No petechiae / purpurea.  No major sores.  Warm and dry    Results:   Cultures: No results found for this or any previous visit (from the past 720 hours).  Labs: Results for orders placed or performed during the hospital encounter of 10/18/23 (from the past 48 hours)  Basic metabolic panel with GFR     Status: Abnormal   Collection Time: 10/27/23  8:33 AM  Result Value Ref Range   Sodium 139 135 - 145 mmol/L   Potassium 3.8 3.5 - 5.1 mmol/L   Chloride 99 98 - 111  mmol/L   CO2 27 22 - 32 mmol/L   Glucose, Bld 125 (H) 70 - 99 mg/dL    Comment: Glucose reference range applies only to samples taken after fasting for at least 8 hours.   BUN 28 (H) 8 - 23 mg/dL   Creatinine, Ser 8.88 (H) 0.44 - 1.00 mg/dL   Calcium 89.7 8.9 - 89.6 mg/dL   GFR, Estimated 51 (L) >60 mL/min    Comment: (NOTE) Calculated using the CKD-EPI Creatinine Equation (2021)    Anion gap 13 5 - 15    Comment: Performed at [facility], [address], [place], [place] [address]  Magnesium      Status: None   Collection Time: 10/27/23  8:33 AM  Result Value Ref Range   Magnesium  2.0 1.7 - 2.4 mg/dL    Comment: Performed at [facility], [address], [place], [place] [address]  CBC     Status: Abnormal   Collection Time: 10/27/23  8:33 AM  Result Value Ref Range   WBC 11.3 (H) 4.0 - 10.5 K/uL   RBC 3.38 (L) 3.87 - 5.11 MIL/uL   Hemoglobin 9.9 (L) 12.0 - 15.0 g/dL   HCT 69.7 (L) 63.9 - 53.9 %   MCV 89.3 80.0 - 100.0 fL   MCH 29.3 26.0 - 34.0 pg   MCHC 32.8 30.0 - 36.0 g/dL   RDW 85.7 88.4 - 84.4 %   Platelets 405 (H) 150 - 400 K/uL   nRBC 0.0 0.0 - 0.2 %    Comment: Performed at [facility], [address] [place], [place] [address]  Creatinine, serum     Status: Abnormal   Collection Time: 10/27/23 11:59 PM  Result Value Ref Range   Creatinine, Ser 1.08 (H) 0.44 - 1.00 mg/dL   GFR, Estimated 53 (L) >60 mL/min    Comment: (NOTE) Calculated using the CKD-EPI Creatinine Equation (2021) Performed at [facility], [address] [place], [place] [address]   CBC     Status: Abnormal   Collection Time: 10/28/23  5:01 AM  Result Value Ref Range   WBC 10.6 (H) 4.0 - 10.5 K/uL   RBC 3.37 (L) 3.87 - 5.11 MIL/uL   Hemoglobin 9.8 (L) 12.0 - 15.0 g/dL   HCT 70.2 (L) 63.9 - 53.9 %   MCV 88.1 80.0 - 100.0 fL   MCH 29.1 26.0 - 34.0 pg   MCHC 33.0 30.0 - 36.0 g/dL   RDW 85.8 88.4 - 84.4 %    Platelets  471 (H) 150 - 400 K/uL   nRBC 0.0 0.0 - 0.2 %    Comment: Performed at [facility], [address] [place], [place] [address]  Potassium     Status: Abnormal   Collection Time: 10/28/23  5:01 AM  Result Value Ref Range   Potassium 3.4 (L) 3.5 - 5.1 mmol/L    Comment: Performed at [facility], [address] [place], [place] [address]  Creatinine, serum     Status: Abnormal   Collection Time: 10/28/23  5:01 AM  Result Value Ref Range   Creatinine, Ser 1.07 (H) 0.44 - 1.00 mg/dL   GFR, Estimated 54 (L) >60 mL/min    Comment: (NOTE) Calculated using the CKD-EPI Creatinine Equation (2021) Performed at [facility], [address], [place], [place] [address]   Magnesium      Status: None   Collection Time: 10/28/23  5:01 AM  Result Value Ref Range   Magnesium  2.0 1.7 - 2.4 mg/dL    Comment: Performed at [facility], [address], [place], [place] [address]  Phosphorus     Status: None   Collection Time: 10/28/23  5:01 AM  Result Value Ref Range   Phosphorus 2.5 2.5 - 4.6 mg/dL    Comment: Performed at [facility], [address] [place], [place] [address]  Potassium     Status: None   Collection Time: 10/29/23  4:57 AM  Result Value Ref Range   Potassium 3.8 3.5 - 5.1 mmol/L    Comment: HEMOLYSIS AT THIS LEVEL MAY AFFECT RESULT Performed at [facility], [address], [place], [place] [address]   Creatinine, serum     Status: Abnormal   Collection Time: 10/29/23  4:57 AM  Result Value Ref Range   Creatinine, Ser 1.05 (H) 0.44 - 1.00 mg/dL   GFR, Estimated 55 (L) >60 mL/min    Comment: (NOTE) Calculated using the CKD-EPI Creatinine Equation (2021) Performed at [facility], [address] [place], [place] [address]      Imaging / Studies: DG Abd Portable 1V-Small Bowel Obstruction Protocol-initial, 8 hr delay Result Date:  10/27/2023 EXAM: CT ABDOMEN WITHOUT CONTRAST 10/27/2023 08:14:00 PM TECHNIQUE: CT of the abdomen was performed without the administration of intravenous contrast. Multiplanar reformatted images are provided for review. Automated exposure control, iterative reconstruction, and/or weight-based adjustment of the mA/kV was utilized to reduce the radiation dose to as low as reasonably achievable. COMPARISON: CT examination of October 24, 2023. CLINICAL HISTORY: Small bowel obstruction FINDINGS: LOWER CHEST: Visualized portion of the lower chest demonstrates no acute abnormality. HEPATOBILIARY: The visualized liver is unremarkable. Gallbladder is unremarkable. SPLEEN: Spleen demonstrates no acute abnormality. PANCREAS: Pancreas demonstrates no acute abnormality. ADRENAL GLANDS: Adrenal glands demonstrate no acute abnormality. KIDNEYS: No stones in the kidneys or proximal ureters. No hydronephrosis. No perinephric or periureteral stranding. GI AND BOWEL: A nasogastric tube is seen with loops within the expected gastric fundus. Numerous loops of dilated small bowel are seen throughout the abdomen, in keeping with a distal small bowel obstruction or adynamic ileus. Administered oral contrast opacifies the numerous loops of dilated small bowel but does not clearly extend to the terminal small bowel seen within the right lower quadrant, in the region of the ileostomy. PERITONEUM AND RETROPERITONEUM: No ascites or free air. Aorta is normal in caliber. LYMPH NODES: No lymphadenopathy. BONES AND SOFT TISSUES: Severe thoracolumbar levoscoliosis is noted. No acute abnormality of the  visualized bones. No focal soft tissue abnormality. IMPRESSION: 1. Distal small bowel obstruction versus adynamic ileus, with oral contrast opacifying dilated small bowel loops but not clearly reaching the terminal small bowel in the right lower quadrant near the ileostomy; correlate clinically and consider serial abdominal radiographs or repeat CT for  progression. 2. Enteric tube terminates in the gastric fundus. 3. Severe thoracolumbar levoscoliosis again noted. Electronically signed by: Dorethia Molt MD 10/27/2023 08:19 PM EDT RP Workstation: [id number]    Medications / Allergies: per chart  Antibiotics: Anti-infectives (From admission, onward)    Start     Dose/Rate Route Frequency Ordered Stop   10/26/23 2200  erythromycin 500 mg in sodium chloride  0.9 % 100 mL IVPB  Status:  Discontinued        500 mg 100 mL/hr over 60 Minutes Intravenous Every 8 hours 10/26/23 1944 10/26/23 2010   10/18/23 2200  cefoTEtan  (CEFOTAN ) 2 g in sodium chloride  0.9 % 100 mL IVPB  Status:  Discontinued       Note to Pharmacy: Pharmacy may adjust dosing strength, schedule, rate of infusion, etc as needed to optimize therapy (has CKD)   2 g 200 mL/hr over 30 Minutes Intravenous Every 24 hours 10/18/23 1848 10/21/23 0720   10/18/23 1930  ceFAZolin  (ANCEF ) IVPB 2g/100 mL premix        2 g 200 mL/hr over 30 Minutes Intravenous Every 8 hours 10/18/23 1848 10/18/23 2014   10/18/23 0845  ceFAZolin  (ANCEF ) IVPB 2g/100 mL premix       Placed in And Linked Group   2 g 200 mL/hr over 30 Minutes Intravenous On call to O.R. 10/18/23 0839 10/18/23 1122   10/18/23 0845  metroNIDAZOLE  (FLAGYL ) IVPB 500 mg       Placed in And Linked Group   500 mg 100 mL/hr over 60 Minutes Intravenous On call to O.R. 10/18/23 0839 10/18/23 1146         Note: Portions of this report may have been transcribed using voice recognition software. Every effort was made to ensure accuracy; however, inadvertent computerized transcription errors may be present.   Any transcriptional errors that result from this process are unintentional.    Elspeth KYM Schultze, MD, FACS, MASCRS Esophageal, Gastrointestinal & Colorectal Surgery Robotic and Minimally Invasive Surgery  [facility] [address] [place], [place]  [address] [phone] Fax [phone] Main  CONTACT INFORMATION: Weekday (9AM-5PM): Call [facility] main office at [phone] Weeknight (5PM-9AM) or Weekend/Holiday: Check EPIC Web Links tab & use AMION (password  [email or web address]) for General Surgery [facility] coverage  Please, DO NOT use SecureChat  (it is not reliable communication to reach operating surgeons & will lead to a delay in care).   Epic staff messaging available for outpatient concerns needing 1-2 business day response.      10/29/2023  8:15 AM

## 2023-10-29 NOTE — Consult Note (Signed)
 WOC by to check patient's status.  Patient was concerned that her pouch my not fit well after surgery.  Bedside nursing reports pouching is working well.  No acute needs.   Cruzito Standre [facility], CNS, CWON-AP [phone]

## 2023-10-29 NOTE — Progress Notes (Signed)
 Patient loss IV access. IV team consulted for reinsertion of IV. IV team unable to place IV. Patient having complaints of pain. Tramadol  100 mg given at 1759, it is Q 6hrs. Other option for pain medication is IV dilaudid . On call provider for [facility] [place] [facility] M.W[place] paged for second option for pain medication. Verbal order with read back given for 5mg  oxycodone  PO one time dose.

## 2023-10-30 ENCOUNTER — Other Ambulatory Visit: Payer: Self-pay

## 2023-10-30 LAB — POTASSIUM: Potassium: 3.1 mmol/L — ABNORMAL LOW (ref 3.5–5.1)

## 2023-10-30 LAB — CBC
HCT: 32.4 % — ABNORMAL LOW (ref 36.0–46.0)
Hemoglobin: 10.8 g/dL — ABNORMAL LOW (ref 12.0–15.0)
MCH: 30.5 pg (ref 26.0–34.0)
MCHC: 33.3 g/dL (ref 30.0–36.0)
MCV: 91.5 fL (ref 80.0–100.0)
Platelets: 529 K/uL — ABNORMAL HIGH (ref 150–400)
RBC: 3.54 MIL/uL — ABNORMAL LOW (ref 3.87–5.11)
RDW: 15.1 % (ref 11.5–15.5)
WBC: 14.3 K/uL — ABNORMAL HIGH (ref 4.0–10.5)
nRBC: 0 % (ref 0.0–0.2)

## 2023-10-30 LAB — CREATININE, SERUM
Creatinine, Ser: 1.06 mg/dL — ABNORMAL HIGH (ref 0.44–1.00)
GFR, Estimated: 54 mL/min — ABNORMAL LOW (ref >60)

## 2023-10-30 MED ORDER — PSYLLIUM 95 % PO PACK: 1.0000 | PACK | Freq: Every day | ORAL | 1 refills | Status: AC

## 2023-10-30 MED ORDER — PSYLLIUM 95 % PO PACK
1.0000 | PACK | Freq: Every day | ORAL | Status: DC
  Administered 2023-10-30 – 2023-10-31 (×2): 1 via ORAL
  Filled 2023-10-30 (×2): qty 1

## 2023-10-30 MED ORDER — LABETALOL HCL 100 MG PO TABS
100.0000 mg | ORAL_TABLET | Freq: Two times a day (BID) | ORAL | Status: DC
  Administered 2023-10-30 – 2023-10-31 (×3): 100 mg via ORAL
  Filled 2023-10-30 (×3): qty 1

## 2023-10-30 MED ORDER — LATANOPROST 0.005 % OP SOLN
1.0000 [drp] | Freq: Every day | OPHTHALMIC | Status: DC
  Administered 2023-10-30: 1 [drp] via OPHTHALMIC
  Filled 2023-10-30: qty 2.5

## 2023-10-30 MED ORDER — OXYCODONE-ACETAMINOPHEN 5-325 MG PO TABS: 1.0000 | ORAL_TABLET | Freq: Four times a day (QID) | ORAL | 0 refills | Status: DC | PRN

## 2023-10-30 MED ORDER — CYCLOBENZAPRINE HCL 5 MG PO TABS
5.0000 mg | ORAL_TABLET | Freq: Three times a day (TID) | ORAL | Status: DC
  Administered 2023-10-30 – 2023-10-31 (×4): 5 mg via ORAL
  Filled 2023-10-30 (×4): qty 1

## 2023-10-30 MED ORDER — CALCIUM POLYCARBOPHIL 625 MG PO TABS: 625.0000 mg | ORAL_TABLET | Freq: Every day | ORAL | Status: DC

## 2023-10-30 MED ORDER — PANTOPRAZOLE SODIUM 40 MG PO TBEC
40.0000 mg | DELAYED_RELEASE_TABLET | Freq: Two times a day (BID) | ORAL | Status: DC
  Administered 2023-10-30 – 2023-10-31 (×3): 40 mg via ORAL
  Filled 2023-10-30 (×3): qty 1

## 2023-10-30 MED ORDER — OXYCODONE HCL 5 MG PO TABS
5.0000 mg | ORAL_TABLET | ORAL | Status: DC | PRN
  Administered 2023-10-30: 5 mg via ORAL
  Administered 2023-10-30: 10 mg via ORAL
  Filled 2023-10-30: qty 1
  Filled 2023-10-30: qty 2

## 2023-10-30 NOTE — Plan of Care (Signed)

## 2023-10-30 NOTE — Plan of Care (Signed)
 ?  Problem: Clinical Measurements: ?Goal: Ability to maintain clinical measurements within normal limits will improve ?Outcome: Progressing ?Goal: Will remain free from infection ?Outcome: Progressing ?Goal: Diagnostic test results will improve ?Outcome: Progressing ?  ?

## 2023-10-30 NOTE — Progress Notes (Signed)
 Physical Therapy Treatment Patient Details Name: Donna [name] MRN: [id number] DOB: 06-Dec-1945 Today's Date: 10/30/2023   History of Present Illness 78 yo female presents to therapy following hospitalization on 10/18/2023 due to para-ileostomy hernia. Pt underwent repair of hernia on 10/17.  Pt PMH includes but is not limited to: DOE, fatigue, colon ca s/p colectomy, OA, HTN, GERD, HLD and L TKA.    PT Comments  Pt is progressing well with therapy. Pt reports general fatigue and weakness and mild pain associated with ileostomy site. Pt did have 1 LOB requiring therapy assist with maneuvering around the hospital door backing up. Pt ambulated in the hall with RW 200+ feet with supervision. Pt does have have decreased balance and will continue to benefit from acute skilled PT to maximize mobility and independence for d/c home with family.    If plan is discharge home, recommend the following: A little help with walking and/or transfers;A little help with bathing/dressing/bathroom;Assistance with cooking/housework;Assist for transportation;Help with stairs or ramp for entrance   Can travel by private vehicle        Equipment Recommendations  None recommended by PT    Recommendations for Other Services       Precautions / Restrictions Precautions Precautions: Fall;Other (comment) Precaution/Restrictions Comments: ileostomy bag Restrictions Weight Bearing Restrictions Per Provider Order: No     Mobility  Bed Mobility Overal bed mobility: Modified Independent Bed Mobility: Supine to Sit   Sidelying to sit: Max assist   Sit to supine: Modified independent (Device/Increase time)        Transfers Overall transfer level: Modified independent Equipment used: Rolling walker (2 wheels) Transfers: Sit to/from Stand Sit to Stand: Modified independent (Device/Increase time)                Ambulation/Gait Ambulation/Gait assistance: Contact guard assist Gait Distance  (Feet): 225 Feet Assistive device: Rolling walker (2 wheels) Gait Pattern/deviations: Decreased stride length, Step-through pattern, Antalgic Gait velocity: decreased     General Gait Details: pt reports left thigh/knee pain s/p TKR 2 yr ago, antalic gait, 1 LOB with backing up and opening the hospital room door. assisted to the BR with RW and stood at sink to brush teeth, supervsion with task and no LOB   Stairs             Wheelchair Mobility     Tilt Bed    Modified Rankin (Stroke Patients Only)       Balance Overall balance assessment: Needs assistance Sitting-balance support: Feet supported, No upper extremity supported Sitting balance-Leahy Scale: Good     Standing balance support: During functional activity, Bilateral upper extremity supported Standing balance-Leahy Scale: Fair                 High Level Balance Comments: min assist with backing up and mild perturbations to balance            Communication Communication Communication: No apparent difficulties  Cognition Arousal: Alert Behavior During Therapy: WFL for tasks assessed/performed   PT - Cognitive impairments: No apparent impairments                         Following commands: Intact      Cueing Cueing Techniques: Verbal cues  Exercises      General Comments        Pertinent Vitals/Pain Pain Assessment Pain Assessment: Faces Faces Pain Scale: Hurts little more Breathing: normal Pain Location: Ostomy site Pain Descriptors / Indicators:  Discomfort Pain Intervention(s): Limited activity within patient's tolerance, Monitored during session, Repositioned    Home Living                          Prior Function            PT Goals (current goals can now be found in the care plan section) Progress towards PT goals: Progressing toward goals    Frequency    Min 3X/week      PT Plan      Co-evaluation              AM-PAC PT 6 Clicks  Mobility   Outcome Measure  Help needed turning from your back to your side while in a flat bed without using bedrails?: None Help needed moving from lying on your back to sitting on the side of a flat bed without using bedrails?: None Help needed moving to and from a bed to a chair (including a wheelchair)?: A Little Help needed standing up from a chair using your arms (e.g., wheelchair or bedside chair)?: A Little Help needed to walk in hospital room?: A Little Help needed climbing 3-5 steps with a railing? : A Lot 6 Click Score: 19    End of Session Equipment Utilized During Treatment: Gait belt Activity Tolerance: Patient tolerated treatment well Patient left: with call bell/phone within reach;in bed;with family/visitor present Nurse Communication: Mobility status PT Visit Diagnosis: Unsteadiness on feet (R26.81);Pain Pain - Right/Left: Right Pain - part of body:  (abdomen)     Time: 8864-8780 PT Time Calculation (min) (ACUTE ONLY): 44 min  Charges:    $Gait Training: 23-37 mins $Therapeutic Activity: 8-22 mins PT General Charges $$ ACUTE PT VISIT: 1 Visit                    Ike Killings, PT  Killings Ike Halter 10/30/2023, 12:29 PM

## 2023-10-30 NOTE — Progress Notes (Addendum)
 10/30/2023  Donna [name] [id number] [birth year]  CARE TEAM: PCP: Clarice Nottingham, MD  Outpatient Care Team: Patient Care Team: Clarice Nottingham, MD as PCP - General (Internal Medicine) Elmira Newman PARAS, MD as PCP - Cardiology (Cardiology) Teresa Lonni HERO, MD as Consulting Physician (Colon and Rectal Surgery) Sheldon Standing, MD as Consulting Physician (General Surgery) Donnald Charleston, MD as Consulting Physician (Gastroenterology) Elmira Newman PARAS, MD as Consulting Physician (Cardiology)  Inpatient Treatment Team: Treatment Team:  Sheldon Standing, MD Massie Delaine SAUNDERS, RN Bullins, Powell PARAS, RN Lang Le CROME, RN Tamea Thersia BIRCH, NT Feliberto Graces, RN Willye Ike POUR, PT Janet Maurilio BROCKS, NT Estelle Hunter DEL, RN [place], Bard LABOR, [facility]   Problem List:   Principal Problem:   Para-ileostomy hernia [facility]) Active Problems:   Gastroesophageal reflux disease   S/P total colectomy   Recurrent incisional hernia with incarceration   Prediabetes   Obstructive sleep apnea syndrome   Ileostomy present (HCC)   History of ulcerative colitis   History of malignant neoplasm of colon   Stage 3b chronic kidney disease (HCC)   Ulcerative colitis (HCC)   Hypokalemia   10/18/2023  POST-OPERATIVE DIAGNOSIS:  PARASTOMAL HERNIA AND VENTRAL INCISIONAL RECURRENT AND INCARCERATED ABDOMINAL WALL HERNIAE  Dimensions of hernia post-op:  16cm x 8cm   PROCEDURE:   ROBOTIC COMPONENT SEPARATION (transversus abdominis release = TAR) on right Repair of incarcerated recurrent incisional and parastomal hernias with mesh ROBOTIC LYSIS OF ADHESIONS SEROSAL REPAIR x2 END ILEOSTOMY REVISION TAP BLOCK - BILATERAL   SURGEON:  Standing KYM Sheldon, MD  OR FINDINGS:  Moderately dense adhesions small bowel to anterior abdominal wall and periumbilical midline hernia.  Swiss cheese hernias extending up to the subxiphoid region.  15 cm in length.  Moderate parastomal hernia around end ileostomy with a  foot of small bowel densely adherent to the hernia sac.  Serosal repairs down x 2.  Ileostomy revision done for last 7 cm given poor tissues.   Type of ventral wall repair:  Laparoscopic underlay repair .with Primary repair of largest hernia Placement of mesh: Right lateral and flank retrorectus preperitoneal.  Midline intraperitoneal. Name of mesh: Bard Ventralight dual sided (polypropylene / Seprafilm) Size of mesh: 33x27cm Orientation: Transverse Mesh overlap:  7cm    Assessment [facility] Stay = 12 days) 12 Days Post-Op    Ileus most likely due to edema and kinking at parastomal ileostomy Sugarbaker repair   Finally consistently opening up much better  Plan:  Tolerated full liquids.  Soft diet for breakfast advance to more solid diet later today.    Restart soluble fiber but continue some milk of magnesia just in case.  If she develops chronic diarrhea, will back off on that and do usual antidiarrheal regimen with most people need with an end ileostomy.  We will see.  If she does not consistently open up in the next few days, may benefit from returning to the OR with endoscopy of the ileostomy to make sure there is no kinking with possible removal of fascial mesh stitches and relaxation of mesh to break possible obstruction.  Patient would really like to avoid reoperation but hard to know if this will open up conservatively.  Hopefully less likely.  We will see.    Has some history of nausea and possible gastroparesis.  Scheduled Reglan .  Normally would do erythromycin but apparently that is not available and do not have a good enteral route.  I think to help it.  Keep her on the  dry side to avoid ileus or edema.  To see if we can get some bowel edema down and help her open up.  IV fluid boluses backup just in case.  Pain control.  Will add some oral regimens so she is less dependent on the IV Dilaudid .  Continue IV Robaxin  for now.  Tramadol  not sufficient.  Switch to  oxycodone   If she does not consistently open up, do PICC line and start TPN.  Hopefully less likely now.  Multimodal pain control.  Switch to PRN (as needed) but she fell behind.  Scheduled Tylenol .  Switch muscle relaxer to oral route.  Flexeril scheduled.  She cannot tolerate gabapentin .  Seem better controlled back on oxycodone  so we will use that.    -monitor electrolytes & replace as needed.  Corrected hypokalemia.  Keep K>4, Mg>2, Phos>3  -HTN -switch from metoprolol to her home labetalol   - GERD.  Continue Pepcid H2 blocker & protonix   Sleep apnea.  Oxygen nightly as needed.  -VTE prophylaxis- SCDs.  Anticoagulation prophyllaxis SQ as appropriate  -mobilize as tolerated to help recovery.  Enlist therapies in moderate/high risk patients as appropriate  I updated the patient's status to the patient  Recommendations were made.  Questions were answered.  She expressed understanding & appreciation.  -Disposition: Hopefully home 10/30 if meets goals. Disposition:  The patient is from: Home Anticipate discharge to:  Home with Home Health Anticipated Date of Discharge is:  October 30,2025   Barriers to discharge:  Pending Clinical improvement (more likely than not), Therapy assessment & Recommendations pending, and Consultant clearance & sign off    Patient currently is NOT MEDICALLY STABLE for discharge from the hospital from a surgery standpoint.      I reviewed nursing notes, last 24 h vitals and pain scores, last 48 h intake and output, last 24 h labs and trends, and last 24 h imaging results.  I have reviewed this patient's available data, including medical history, events of note, test results, etc as part of my evaluation.   A significant portion of that time was spent in counseling. Care during the described time interval was provided by me.  This care required moderate level of medical decision making.  10/30/2023    Subjective: (Chief complaint)  Patient lost IV  access.  Pain not controlled with tramadol  switch to oxycodone   Tolerating full liquids.  Hungry for more.  Walking some  Objective:  Vital signs:  Vitals:   10/29/23 0500 10/29/23 0637 10/29/23 2243 10/30/23 0642  BP:  131/62 (!) 152/69 (!) 146/74  Pulse:  73 81 66  Resp:  16 16 17   Temp:  98.4 F (36.9 C) 98.6 F (37 C) 98 F (36.7 C)  TempSrc:  Oral Oral   SpO2:  98% 98% 97%  Weight: 79.5 kg     Height:        Last BM Date : 10/29/23  Intake/Output   Yesterday:  10/28 0701 - 10/29 0700 In: 414.8 [P.O.:240; I.V.:124.8; IV Piggyback:50] Out: 2150 [Urine:700; Stool:1450] This shift:  No intake/output data recorded.  Bowel function:  Flatus: YES  BM:  YES  Drain: (No drain)   Physical Exam:  General: Pt awake/alert in no acute distress.  Sitting up in chair eating breakfast.  Calm.  Relaxed.  Bright.  Alert. Eyes: PERRL, normal EOM.  Sclera clear.  No icterus Neuro: CN II-XII intact w/o focal sensory/motor deficits. Lymph: No head/neck/groin lymphadenopathy Psych:  No delerium/psychosis/paranoia.  Oriented x 4 HENT:  Normocephalic, Mucus membranes moist.  No thrush Neck: Supple, No tracheal deviation.  No obvious thyromegaly Chest: No pain to chest wall compression.  Good respiratory excursion.  No audible wheezing CV:  Pulses intact.  Regular rhythm.  No major extremity edema MS: Normal AROM mjr joints.  No obvious deformity  Abdomen: Obese. Soft.  Nondistended.  Mildly tender at incisions only.  No evidence of peritonitis.  No incarcerated hernias. Ileostomy in place with no edema.  Large volume of oatmeal consistency stool and flatus in bag  Ext:   No deformity.  No mjr edema.  No cyanosis Skin: No petechiae / purpurea.  No major sores.  Warm and dry    Results:   Cultures: No results found for this or any previous visit (from the past 720 hours).  Labs: Results for orders placed or performed during the hospital encounter of 10/18/23 (from the  past 48 hours)  Potassium     Status: None   Collection Time: 10/29/23  4:57 AM  Result Value Ref Range   Potassium 3.8 3.5 - 5.1 mmol/L    Comment: HEMOLYSIS AT THIS LEVEL MAY AFFECT RESULT Performed at [facility], [address], [place], [place] [address]   Creatinine, serum     Status: Abnormal   Collection Time: 10/29/23  4:57 AM  Result Value Ref Range   Creatinine, Ser 1.05 (H) 0.44 - 1.00 mg/dL   GFR, Estimated 55 (L) >60 mL/min    Comment: (NOTE) Calculated using the CKD-EPI Creatinine Equation (2021) Performed at [facility], [address], [place], [place] [address]   Potassium     Status: Abnormal   Collection Time: 10/30/23  5:23 AM  Result Value Ref Range   Potassium 3.1 (L) 3.5 - 5.1 mmol/L    Comment: Performed at [facility], [address], [place], [place] [address]  Creatinine, serum     Status: Abnormal   Collection Time: 10/30/23  5:23 AM  Result Value Ref Range   Creatinine, Ser 1.06 (H) 0.44 - 1.00 mg/dL   GFR, Estimated 54 (L) >60 mL/min    Comment: (NOTE) Calculated using the CKD-EPI Creatinine Equation (2021) Performed at [facility], [address], [place], [place] [address]      Imaging / Studies: No results found.   Medications / Allergies: per chart  Antibiotics: Anti-infectives (From admission, onward)    Start     Dose/Rate Route Frequency Ordered Stop   10/26/23 2200  erythromycin 500 mg in sodium chloride  0.9 % 100 mL IVPB  Status:  Discontinued        500 mg 100 mL/hr over 60 Minutes Intravenous Every 8 hours 10/26/23 1944 10/26/23 2010   10/18/23 2200  cefoTEtan  (CEFOTAN ) 2 g in sodium chloride  0.9 % 100 mL IVPB  Status:  Discontinued       Note to Pharmacy: Pharmacy may adjust dosing strength, schedule, rate of infusion, etc as needed to optimize therapy (has CKD)   2 g 200 mL/hr over 30 Minutes Intravenous Every 24 hours 10/18/23 1848 10/21/23  0720   10/18/23 1930  ceFAZolin  (ANCEF ) IVPB 2g/100 mL premix        2 g 200 mL/hr over 30 Minutes Intravenous Every 8 hours 10/18/23 1848 10/18/23 2014   10/18/23 0845  ceFAZolin  (ANCEF ) IVPB 2g/100 mL premix       Placed in And Linked Group   2 g 200 mL/hr over 30 Minutes Intravenous On call  to O.R. 10/18/23 9160 10/18/23 1122   10/18/23 0845  metroNIDAZOLE  (FLAGYL ) IVPB 500 mg       Placed in And Linked Group   500 mg 100 mL/hr over 60 Minutes Intravenous On call to O.R. 10/18/23 0839 10/18/23 1146         Note: Portions of this report may have been transcribed using voice recognition software. Every effort was made to ensure accuracy; however, inadvertent computerized transcription errors may be present.   Any transcriptional errors that result from this process are unintentional.    Elspeth KYM Schultze, MD, FACS, MASCRS Esophageal, Gastrointestinal & Colorectal Surgery Robotic and Minimally Invasive Surgery  [facility] [address] [place], [place] [address] [phone] Fax [phone] Main  CONTACT INFORMATION: Weekday (9AM-5PM): Call [facility] main office at [phone] Weeknight (5PM-9AM) or Weekend/Holiday: Check EPIC Web Links tab & use AMION (password  [email or web address]) for General Surgery [facility] coverage  Please, DO NOT use SecureChat  (it is not reliable communication to reach operating surgeons & will lead to a delay in care).   Epic staff messaging available for outpatient concerns needing 1-2 business day response.      10/30/2023  7:33 AM

## 2023-10-31 MED ORDER — FAMOTIDINE 20 MG PO TABS
20.0000 mg | ORAL_TABLET | Freq: Once | ORAL | Status: AC
  Administered 2023-10-31: 20 mg via ORAL
  Filled 2023-10-31: qty 1

## 2023-10-31 MED ORDER — METOCLOPRAMIDE HCL 5 MG PO TABS: 5.0000 mg | ORAL_TABLET | Freq: Three times a day (TID) | ORAL | 5 refills | Status: AC | PRN

## 2023-10-31 NOTE — Progress Notes (Signed)
 Occupational Therapy Treatment Patient Details Name: Donna [name] MRN: [id number] DOB: July 17, 1945 Today's Date: 10/31/2023   History of present illness 78 yo female presents to therapy following hospitalization on 10/18/2023 due to para-ileostomy hernia. Pt underwent repair of hernia on 10/17.  NG tube has been discontinued and diet has been advanced.  Pt PMH includes but is not limited to: DOE, fatigue, colon ca s/p colectomy, OA, HTN, GERD, HLD and L TKA.   OT comments  Patient seen with family member in room.  Provided handout and education regarding variety of long shoehorns available, reminding patient that demonstrator shoehorn used in earlier visit may have been too long for optimal use.  Noted various lengths and materials of shoehorns available in handout.  Reviewed shoe donning technique with patient and family member.  Patient had no further questions or concerns and is anticipated to discharge home this date with home health OT.  Acute OT to sign off.      If plan is discharge home, recommend the following:  A little help with walking and/or transfers;A little help with bathing/dressing/bathroom;Assistance with cooking/housework;Assist for transportation;Help with stairs or ramp for entrance   Equipment Recommendations  Other (comment) (Long shoehorn (handout provided))       Precautions / Restrictions Precautions Precautions: Fall Recall of Precautions/Restrictions: Intact Precaution/Restrictions Comments: ileostomy bag Restrictions Weight Bearing Restrictions Per Provider Order: No       Mobility Bed Mobility Overal bed mobility: Modified Independent Bed Mobility: Supine to Sit Supine to sit: HOB elevated, Used rails, Modified independent (Device/Increase time)   General bed mobility comments: Patient transitioned to EOB to have morning meal.      Balance Overall balance assessment: Modified Independent Sitting-balance support: Feet supported, No upper  extremity supported Sitting balance-Leahy Scale: Good     ADL either performed or assessed with clinical judgement   ADL Eating/Feeding: Independent                       General Comments Surgeon has signed discharge order    Pertinent Vitals/ Pain       Pain Assessment Pain Assessment: No/denies pain         Frequency  Min 2X/week        Progress Toward Goals  OT Goals(current goals can now be found in the care plan section)  Progress towards OT goals: Progressing toward goals  Acute Rehab OT Goals Patient Stated Goal: Be able to return home OT Goal Formulation: With patient Time For Goal Achievement: 11/04/23 Potential to Achieve Goals: Good  Plan         AM-PAC OT 6 Clicks Daily Activity     Outcome Measure   Help from another person eating meals?: None Help from another person taking care of personal grooming?: None Help from another person toileting, which includes using toliet, bedpan, or urinal?: A Little Help from another person bathing (including washing, rinsing, drying)?: A Little Help from another person to put on and taking off regular upper body clothing?: A Little Help from another person to put on and taking off regular lower body clothing?: A Lot 6 Click Score: 19    End of Session OT Visit Diagnosis: Unsteadiness on feet (R26.81);Muscle weakness (generalized) (M62.81)   Activity Tolerance Patient tolerated treatment well   Patient Left in bed;with call bell/phone within reach;with family/visitor present (Sitting EOB)   Nurse Communication Other (comment) (Readiness to D/C)        Time: 9053-9043 OT Time  Calculation (min): 10 min  Charges: OT General Charges $OT Visit: 1 Visit OT Treatments $Therapeutic Activity: 8-22 mins  Dejon Jungman B. Damascus Feldpausch, MS, OTR/L  10/31/2023, 11:29 AM

## 2023-10-31 NOTE — Progress Notes (Signed)
 Patient was given discharge instructions, and all questions were answered.  Patient was stable for discharge and was taken to the main exit by wheelchair.

## 2023-10-31 NOTE — Care Management Important Message (Signed)
 Important Message  Patient Details IM Letter given. Name: Donna [name] Dpwija MRN: [id number] Date of Birth: 12-14-1945   Important Message Given:  Yes - Medicare IM     Meghen Akopyan 10/31/2023, 10:10 AM

## 2023-10-31 NOTE — Discharge Summary (Addendum)
 Physician Discharge Summary    Donna [name] MRN: [id number] DOB/AGE: Nov 26, 1945 = 78 y.o.  Patient Care Team: Clarice Nottingham, MD as PCP - General (Internal Medicine) Elmira Newman PARAS, MD as PCP - Cardiology (Cardiology) Teresa Lonni HERO, MD as Consulting Physician (Colon and Rectal Surgery) Sheldon Standing, MD as Consulting Physician (General Surgery) Donnald Charleston, MD as Consulting Physician (Gastroenterology) Elmira Newman PARAS, MD as Consulting Physician (Cardiology)  Admit date: 10/18/2023  Discharge date: 10/31/2023  Hospital Stay = 13 days    Discharge Diagnoses:  Principal Problem:   Para-ileostomy hernia [facility]) Active Problems:   Gastroesophageal reflux disease   S/P total colectomy   Recurrent incisional hernia with incarceration   Prediabetes   Obstructive sleep apnea syndrome   Ileostomy present (HCC)   History of ulcerative colitis   History of malignant neoplasm of colon   Stage 3b chronic kidney disease (HCC)   Ulcerative colitis (HCC)   Hypokalemia   13 Days Post-Op  10/18/2023  POST-OPERATIVE DIAGNOSIS:   PARASTOMAL HERNIA AND VENTRAL INCISIONAL RECURRENT AND INCARCERATED ABDOMINAL WALL HERNIAE  SURGERY:  10/18/2023  Procedure(s): REPAIR, HERNIA, PARASTOMAL, ROBOT-ASSISTED REPAIR, HERNIA, VENTRAL, ROBOT-ASSISTED  SURGEON:    Surgeon(s): Sheldon Standing, MD  Consults: Case Management / Social Work, Physical Therapy, Occupational Therapy, Pharmacy, Nutrition, Wound ostomy consult nurse (WOCN), and Anesthesia  Hospital Course:   The patient underwent the surgery above.  Postoperatively, the patient gradually mobilized.  Patient struggled with intermittent bowel movements and flatus and nausea and vomiting.  Eventually required nasogastric tube decompression.  After that more consistently opened up.  Tolerated a gradual advancement on diet consistency and by the time of discharge was and advanced to a solid diet.  Pain and other  symptoms were treated aggressively.    By the time of discharge, the patient was walking well the hallways, eating food, having flatus.  Pain was well-controlled on an oral medications.  Based on meeting discharge criteria and continuing to recover, I felt it was safe for the patient to be discharged from the hospital to further recover with close followup. Postoperative recommendations were discussed in detail.  They are written as well.  Discharged Condition: stable  Discharge Exam: Blood pressure (!) 112/57, pulse 72, temperature 98.8 F (37.1 C), resp. rate 15, height 5' 3 (1.6 m), weight 79.5 kg, SpO2 98%.  General: Pt awake/alert/oriented x4 in No acute distress Eyes: PERRL, normal EOM.  Sclera clear.  No icterus Neuro: CN II-XII intact w/o focal sensory/motor deficits. Lymph: No head/neck/groin lymphadenopathy Psych:  No delerium/psychosis/paranoia HENT: Normocephalic, Mucus membranes moist.  No thrush Neck: Supple, No tracheal deviation Chest: No chest wall pain w good excursion CV:  Pulses intact.  Regular rhythm MS: Normal AROM mjr joints.  No obvious deformity Abdomen: Soft.  Nondistended.  Tenderness at right flank primarily.  Right upper quadrant ileostomy with some edema but no necrosis.  Bag with large volume of flatus and oatmeal consistency stool in bag no evidence of peritonitis.  No incarcerated hernias. Ext:  SCDs BLE.  No mjr edema.  No cyanosis Skin: No petechiae / purpura   Disposition:    Follow-up Information     Sheldon Standing, MD. Schedule an appointment as soon as possible for a visit in 1 month(s).   Specialties: General Surgery, Colon and Rectal Surgery Why: To follow up after your operation Contact information: [address] [place] [address] [phone]         Care, [facility] Follow  up.   Specialty: Home Health Services Why: Home Health :   Physical Therapy Home Health Aide Contact information: [address] [place] [address] [phone]                 Discharge disposition: 01-Home or Self Care       Discharge Instructions     Call MD for:   Complete by: As directed    FEVER > 101.5 F  (temperatures < 101.5 F are not significant)   Call MD for:  extreme fatigue   Complete by: As directed    Call MD for:  persistant dizziness or light-headedness   Complete by: As directed    Call MD for:  persistant nausea and vomiting   Complete by: As directed    Call MD for:  redness, tenderness, or signs of infection (pain, swelling, redness, odor or green/yellow discharge around incision site)   Complete by: As directed    Call MD for:  severe uncontrolled pain   Complete by: As directed    Diet - low sodium heart healthy   Complete by: As directed    Start with a bland diet such as soups, liquids, starchy foods, low fat foods, etc. the first few days at home. Gradually advance to a solid, low-fat, high fiber diet by the end of the first week at home.   Add a fiber supplement to your diet (Metamucil, etc) If you feel full, bloated, or constipated, stay on a full liquid or pureed/blenderized diet for a few days until you feel better and are no longer constipated.   Discharge instructions   Complete by: As directed    See Discharge Instructions If you are not getting better after two weeks or are noticing you are getting worse, contact our office [phone] for further advice.  We may need to adjust your medications, re-evaluate you in the office, send you to the emergency room, or see what other things we can do to help. The clinic staff is available to answer your questions during regular business hours (8:30am-5pm).  Please don't hesitate to call and ask to speak to one of our nurses for clinical concerns.    A surgeon from [facility] is always on call at the hospitals 24 hours/day If you have a medical emergency, go to the nearest emergency room or  call 911.   Discharge wound care:   Complete by: As directed    It is good for closed incisions and even open wounds to be washed every day.  Shower every day.  Short baths are fine.  Wash the incisions and wounds clean with soap & water .    You may leave closed incisions open to air if it is dry.   You may cover the incision with clean gauze & replace it after your daily shower for comfort.   Driving Restrictions   Complete by: As directed    You may drive when: - you are no longer taking narcotic prescription pain medication - you can comfortably wear a seatbelt - you can safely make sudden turns/stops without pain.   Increase activity slowly   Complete by: As directed    Start light daily activities --- self-care, walking, climbing stairs- beginning the day after surgery.  Gradually increase activities as tolerated.  Control your pain to be active.  Stop when you are tired.  Ideally, walk several times a day, eventually an hour a day.   Most  people are back to most day-to-day activities in a few weeks.  It takes 4-6 weeks to get back to unrestricted, intense activity. If you can walk 30 minutes without difficulty, it is safe to try more intense activity such as jogging, treadmill, bicycling, low-impact aerobics, swimming, etc. Save the most intensive and strenuous activity for last (Usually 4-8 weeks after surgery) such as sit-ups, heavy lifting, contact sports, etc.  Refrain from any intense heavy lifting or straining until you are off narcotics for pain control.  You will have off days, but things should improve week-by-week. DO NOT PUSH THROUGH PAIN.  Let pain be your guide: If it hurts to do something, don't do it.   Lifting restrictions   Complete by: As directed    If you can walk 30 minutes without difficulty, it is safe to try more intense activity such as jogging, treadmill, bicycling, low-impact aerobics, swimming, etc. Save the most intensive and strenuous activity for last  (Usually 4-8 weeks after surgery) such as sit-ups, heavy lifting, contact sports, etc.   Refrain from any intense heavy lifting or straining until you are off narcotics for pain control.  You will have off days, but things should improve week-by-week. DO NOT PUSH THROUGH PAIN.  Let pain be your guide: If it hurts to do something, don't do it.  Pain is your body warning you to avoid that activity for another week until the pain goes down.   May shower / Bathe   Complete by: As directed    May walk up steps   Complete by: As directed    Sexual Activity Restrictions   Complete by: As directed    You may have sexual intercourse when it is comfortable. If it hurts to do something, stop.       Allergies as of 10/31/2023       Reactions   Ezetimibe Other (See Comments)   Unknown   Gabapentin  Nausea And Vomiting   N/V and headaches   Iodinated Contrast Media Other (See Comments)   Feels faint, coughing, bp elevated   Mesalamine Other (See Comments)   Unknown   Ozempic (0.25 Or 0.5 Mg-dose) [semaglutide(0.25 Or 0.5mg -dos)] Nausea And Vomiting   Prilosec [omeprazole ]    Made legs hurt, hair fell out, and nauseated.    Statins Other (See Comments)   Muscle spasms        Medication List     STOP taking these medications    traMADol  50 MG tablet Commonly known as: ULTRAM        TAKE these medications    acetaminophen  500 MG tablet Commonly known as: TYLENOL  Take 1,000 mg by mouth every 6 (six) hours as needed for mild pain or headache.   amLODipine  10 MG tablet Commonly known as: NORVASC  Take 10 mg by mouth at bedtime.   aspirin  EC 81 MG tablet Take 1 tablet (81 mg total) by mouth daily. Swallow whole.   B COMPLEX 100 PO Take 1 tablet by mouth daily.   benazepril  40 MG tablet Commonly known as: LOTENSIN  Take 40 mg by mouth at bedtime.   calcium carbonate 1250 (500 Ca) MG tablet Commonly known as: OS-CAL - dosed in mg of elemental calcium Take 2 tablets by mouth 2  (two) times daily with a meal.   calcium carbonate 500 MG chewable tablet Commonly known as: TUMS - dosed in mg elemental calcium Chew 1 tablet by mouth daily.   cyclobenzaprine 5 MG tablet Commonly known as: FLEXERIL Take 1  tablet (5 mg total) by mouth 3 (three) times daily as needed for muscle spasms.   famotidine 20 MG tablet Commonly known as: PEPCID Take 20 mg by mouth daily.   labetalol  100 MG tablet Commonly known as: NORMODYNE  TAKE 1 TABLET TWICE DAILY (NEED TO KEEP UPCOMING APPOINTMENT IN APRIL TO PREVENT ANY MISSED DOSES)   latanoprost  0.005 % ophthalmic solution Commonly known as: XALATAN  Place 1 drop into both eyes at bedtime.   loperamide  2 MG capsule Commonly known as: IMODIUM  Take 1 capsule (2 mg total) by mouth 2 (two) times daily. What changed:  how much to take when to take this   metoCLOPramide  5 MG tablet Commonly known as: REGLAN  Take 1 tablet (5 mg total) by mouth every 8 (eight) hours as needed for nausea or vomiting.   multivitamin with minerals Tabs tablet Take 1 tablet by mouth daily. Woman's one a day   Muscle Rub 10-15 % Crea Apply 1 application topically 2 (two) times daily as needed for muscle pain.   NON FORMULARY Pt uses a cpap nightly   oxyCODONE -acetaminophen  5-325 MG tablet Commonly known as: Percocet Take 1 tablet by mouth every 6 (six) hours as needed for severe pain (pain score 7-10). Can increase to 2 pills at a time if needed   pantoprazole  40 MG tablet Commonly known as: PROTONIX  Take 40 mg by mouth 2 (two) times daily before a meal.   psyllium 95 % Pack Commonly known as: HYDROCIL/METAMUCIL Take 1 packet by mouth daily. What changed: when to take this   Repatha  SureClick 140 MG/ML Soaj Generic drug: Evolocumab  Inject 140 mg (1 pen) into the skin every 14 days   timolol 0.5 % ophthalmic solution Commonly known as: TIMOPTIC Place 1 drop into both eyes every morning.   traZODone 50 MG tablet Commonly known as:  DESYREL Take 50 mg by mouth at bedtime.               Discharge Care Instructions  (From admission, onward)           Start     Ordered   10/31/23 0000  Discharge wound care:       Comments: It is good for closed incisions and even open wounds to be washed every day.  Shower every day.  Short baths are fine.  Wash the incisions and wounds clean with soap & water .    You may leave closed incisions open to air if it is dry.   You may cover the incision with clean gauze & replace it after your daily shower for comfort.   10/31/23 0719            Significant Diagnostic Studies:  Results for orders placed or performed during the hospital encounter of 10/18/23 (from the past 72 hours)  Potassium     Status: None   Collection Time: 10/29/23  4:57 AM  Result Value Ref Range   Potassium 3.8 3.5 - 5.1 mmol/L    Comment: HEMOLYSIS AT THIS LEVEL MAY AFFECT RESULT Performed at [facility], [address] [place], [place] [address]   Creatinine, serum     Status: Abnormal   Collection Time: 10/29/23  4:57 AM  Result Value Ref Range   Creatinine, Ser 1.05 (H) 0.44 - 1.00 mg/dL   GFR, Estimated 55 (L) >60 mL/min    Comment: (NOTE) Calculated using the CKD-EPI Creatinine Equation (2021) Performed at [facility], [address], [place], [place] [address]  Potassium     Status: Abnormal   Collection Time: 10/30/23  5:23 AM  Result Value Ref Range   Potassium 3.1 (L) 3.5 - 5.1 mmol/L    Comment: Performed at [facility], [address], [place], [place] [address]  Creatinine, serum     Status: Abnormal   Collection Time: 10/30/23  5:23 AM  Result Value Ref Range   Creatinine, Ser 1.06 (H) 0.44 - 1.00 mg/dL   GFR, Estimated 54 (L) >60 mL/min    Comment: (NOTE) Calculated using the CKD-EPI Creatinine Equation (2021) Performed at [facility], [address] [place], [place] [address]   CBC      Status: Abnormal   Collection Time: 10/30/23 12:35 PM  Result Value Ref Range   WBC 14.3 (H) 4.0 - 10.5 K/uL   RBC 3.54 (L) 3.87 - 5.11 MIL/uL   Hemoglobin 10.8 (L) 12.0 - 15.0 g/dL   HCT 67.5 (L) 63.9 - 53.9 %   MCV 91.5 80.0 - 100.0 fL   MCH 30.5 26.0 - 34.0 pg   MCHC 33.3 30.0 - 36.0 g/dL   RDW 84.8 88.4 - 84.4 %   Platelets 529 (H) 150 - 400 K/uL   nRBC 0.0 0.0 - 0.2 %    Comment: Performed at [facility], [address], [place], [place] [address]    No results found.  Past Medical History:  Diagnosis Date   Anemia    Arthritis    Bulging lumbar disc    Cancer (HCC)    colon   [name] (HCC) 01/13/2015   Degeneration of lumbar or lumbosacral intervertebral disc 01/21/2015   Essential hypertension 01/21/2015   GERD (gastroesophageal reflux disease) 01/21/2015   Heart murmur    ECHO 05-17-2023   History of excision of intestinal structure 10/18/2023   Hyperlipidemia 01/21/2015   Hypertension    Pre-diabetes    diet   S/P total colectomy 05/29/2019   Ulcerative colitis [facility])     Past Surgical History:  Procedure Laterality Date   BIOPSY  06/29/2022   Procedure: BIOPSY;  Surgeon: Teresa Lonni HERO, MD;  Location: [facility];  Service: General;;   CESAREAN SECTION     COLON SURGERY  2015   for twisted bowel   ERCP N/A 07/10/2023   Procedure: ERCP, WITH INTERVENTION IF INDICATED;  Surgeon: Rosalie Kitchens, MD;  Location: [facility];  Service: Gastroenterology;  Laterality: N/A;   EYE SURGERY Right    cataract extraction   FLEXIBLE SIGMOIDOSCOPY N/A 05/18/2020   Procedure: SURVEILLANCE FLEXIBLE SIGMOIDOSCOPY;  Surgeon: Teresa Lonni HERO, MD;  Location: [facility];  Service: General;  Laterality: N/A;   FLEXIBLE SIGMOIDOSCOPY N/A 06/29/2022   Procedure: FLEXIBLE SIGMOIDOSCOPY;  Surgeon: Teresa Lonni HERO, MD;  Location: [facility];  Service: General;  Laterality: N/A;   FLEXIBLE SIGMOIDOSCOPY Left 08/29/2023   Procedure:  [place] SIDE;  Surgeon: Teresa Lonni HERO, MD;  Location: [facility];  Service: General;  Laterality: Left;   INCISIONAL HERNIA REPAIR N/A 05/29/2019   Procedure: PRIMARY INCISIONAL HERNIA REPAIR;  Surgeon: Teresa Lonni HERO, MD;  Location: [facility];  Service: General;  Laterality: N/A;   LAPAROTOMY Right 01/12/2015   Procedure: OPEN RIGHT COLECTOMY;  Surgeon: Donnice Bury, MD;  Location: [facility] OR;  Service: General;  Laterality: Right;   RIGHT COLECTOMY  01/12/2015   TOTAL KNEE ARTHROPLASTY Left 06/20/2018   Procedure: TOTAL KNEE ARTHROPLASTY;  Surgeon: Yvone Rush, MD;  Location: [facility];  Service: Orthopedics;  Laterality: Left;   XI ROBOTIC ASSISTED PARASTOMAL HERNIA REPAIR N/A 10/18/2023   Procedure: REPAIR, HERNIA, PARASTOMAL, ROBOT-ASSISTED;  Surgeon: Sheldon Standing, MD;  Location: [facility];  Service: General;  Laterality: N/A;  ROBOTIC PARASTOMAL & INCISIONAL HERNIA REPAIRS WITH MESH   XI ROBOTIC ASSISTED VENTRAL HERNIA N/A 10/18/2023   Procedure: REPAIR, HERNIA, VENTRAL, ROBOT-ASSISTED;  Surgeon: Sheldon Standing, MD;  Location: [facility];  Service: General;  Laterality: N/A;    Social History   Socioeconomic History   Marital status: Divorced    Spouse name: Not on file   Number of children: 3   Years of education: Not on file   Highest education level: Not on file  Occupational History   Not on file  Tobacco Use   Smoking status: Never    Passive exposure: Past   Smokeless tobacco: Never  Vaping Use   Vaping status: Never Used  Substance and Sexual Activity   Alcohol use: No   Drug use: No   Sexual activity: Not Currently  Other Topics Concern   Not on file  Social History Narrative   Not on file   Social Drivers of Health   Financial Resource Strain: Not on file  Food Insecurity: No Food Insecurity (10/19/2023)   Hunger Vital Sign    Worried About Running Out of Food in the Last Year: Never true    Ran Out of Food in the Last Year: Never true   Transportation Needs: No Transportation Needs (10/19/2023)   PRAPARE - Administrator, Civil Service (Medical): No    Lack of Transportation (Non-Medical): No  Physical Activity: Not on file  Stress: Not on file  Social Connections: Moderately Integrated (10/19/2023)   Social Connection and Isolation Panel    Frequency of Communication with Friends and Family: Three times a week    Frequency of Social Gatherings with Friends and Family: Once a week    Attends Religious Services: More than 4 times per year    Active Member of [employer] or Organizations: Yes    Attends Banker Meetings: 1 to 4 times per year    Marital Status: Divorced  Catering Manager Violence: Not At Risk (10/19/2023)   Humiliation, Afraid, Rape, and Kick questionnaire    Fear of Current or Ex-Partner: No    Emotionally Abused: No    Physically Abused: No    Sexually Abused: No    Family History  Problem Relation Age of Onset   Diabetes Mother    Ulcerative colitis Father    Heart failure Sister    Sleep apnea Son     Current Facility-Administered Medications  Medication Dose Route Frequency Provider Last Rate Last Admin   0.9 %  sodium chloride  infusion  250 mL Intravenous PRN Sheldon Standing, MD 10 mL/hr at 10/27/23 0034 250 mL at 10/27/23 0034   0.9 %  sodium chloride  infusion  250 mL Intravenous PRN Sheldon Standing, MD       acetaminophen  (TYLENOL ) tablet 1,000 mg  1,000 mg Oral Q6H Davey Bergsma, MD   1,000 mg at 10/30/23 2333   calcium carbonate (OS-CAL - dosed in mg of elemental calcium) tablet 2,500 mg  2 tablet Oral BID WC Sheldon Standing, MD   2,500 mg at 10/30/23 1647   calcium carbonate (TUMS - dosed in mg elemental calcium) chewable tablet 200 mg of elemental calcium  1 tablet Oral Daily Sheldon Standing, MD   200 mg of elemental calcium at 10/30/23 [phone]  cyclobenzaprine (FLEXERIL) tablet 5 mg  5 mg Oral TID Sheldon Standing, MD   5 mg at 10/30/23 2148   diphenhydrAMINE  (BENADRYL )  injection 12.5 mg  12.5 mg Intravenous Q6H PRN Sheldon Standing, MD       enalaprilat (VASOTEC) injection 0.625-1.25 mg  0.625-1.25 mg Intravenous Q6H PRN Sheldon Standing, MD       enoxaparin  (LOVENOX ) injection 40 mg  40 mg Subcutaneous Q24H Carolee Rosaline DASEN, RPH   40 mg at 10/30/23 0729   famotidine (PEPCID) IVPB 20 mg premix  20 mg Intravenous Q12H Meganne Rita, MD 100 mL/hr at 10/30/23 2154 20 mg at 10/30/23 2154   feeding supplement (ENSURE SURGERY) liquid 237 mL  237 mL Oral BID BM Sheldon Standing, MD   237 mL at 10/29/23 1320   HYDROmorphone  (DILAUDID ) injection 0.5-1 mg  0.5-1 mg Intravenous Q4H PRN Sheldon Standing, MD   1 mg at 10/30/23 9579   labetalol  (NORMODYNE ) tablet 100 mg  100 mg Oral BID Sheldon Standing, MD   100 mg at 10/30/23 2148   lactated ringers  bolus 1,000 mL  1,000 mL Intravenous Once Sheldon Standing, MD   Held at 10/26/23 2238   lactated ringers  bolus 1,000 mL  1,000 mL Intravenous Q8H PRN Sheldon Standing, MD       latanoprost  (XALATAN ) 0.005 % ophthalmic solution 1 drop  1 drop Both Eyes ARLEEN Sheldon Standing, MD   1 drop at 10/30/23 2202   lip balm (CARMEX) ointment   Topical PRN Sheldon Standing, MD   Given at 10/27/23 1447   magic mouthwash  15 mL Oral QID PRN Sheldon Standing, MD       magnesium  hydroxide (MILK OF MAGNESIA) suspension 30 mL  30 mL Oral BID Sheldon Standing, MD   30 mL at 10/30/23 2148   menthol (CEPACOL) lozenge 3 mg  1 lozenge Oral PRN Sheldon Standing, MD       metoCLOPramide  (REGLAN ) tablet 5 mg  5 mg Oral TID AC & HS Sheldon Standing, MD   5 mg at 10/30/23 2148   metoprolol tartrate (LOPRESSOR) injection 5 mg  5 mg Intravenous Q6H PRN Sheldon Standing, MD       metoprolol tartrate (LOPRESSOR) injection 5 mg  5 mg Intravenous Q6H PRN Sheldon Standing, MD       Muscle Rub CREA 1 Application  1 Application Topical BID PRN Sheldon Standing, MD       naphazoline-glycerin (CLEAR EYES REDNESS) ophth solution 1-2 drop  1-2 drop Both Eyes QID PRN Sheldon Standing, MD       ondansetron  (ZOFRAN -ODT)  disintegrating tablet 4 mg  4 mg Oral Q6H PRN Sheldon Standing, MD       Or   ondansetron  (ZOFRAN ) injection 4 mg  4 mg Intravenous Q6H PRN Sheldon Standing, MD   4 mg at 10/26/23 1806   oxyCODONE  (Oxy IR/ROXICODONE ) immediate release tablet 5-10 mg  5-10 mg Oral Q4H PRN Sheldon Standing, MD   10 mg at 10/30/23 1946   pantoprazole  (PROTONIX ) EC tablet 40 mg  40 mg Oral BID Sheldon Standing, MD   40 mg at 10/30/23 2148   phenol (CHLORASEPTIC) mouth spray 2 spray  2 spray Mouth/Throat PRN Sheldon Standing, MD       prochlorperazine (COMPAZINE) injection 5-10 mg  5-10 mg Intravenous Q6H PRN Sheldon Standing, MD   7.5 mg at 10/24/23 2148   psyllium (HYDROCIL/METAMUCIL) 1 packet  1 packet Oral Daily Sheldon Standing, MD   1 packet at 10/30/23 [phone]  simethicone  (MYLICON) chewable tablet 40 mg  40 mg Oral Q6H PRN Sheldon Standing, MD   40 mg at 10/25/23 2144   sodium chloride  (OCEAN) 0.65 % nasal spray 1-2 spray  1-2 spray Each Nare Q6H PRN Sheldon Standing, MD       sodium chloride  flush (NS) 0.9 % injection 3 mL  3 mL Intravenous Q12H Jaquelin Meaney, MD   3 mL at 10/30/23 2148   sodium chloride  flush (NS) 0.9 % injection 3 mL  3 mL Intravenous PRN Sheldon Standing, MD       sodium chloride  flush (NS) 0.9 % injection 3 mL  3 mL Intravenous Q12H Hanne Kegg, MD   3 mL at 10/30/23 2149   sodium chloride  flush (NS) 0.9 % injection 3 mL  3 mL Intravenous PRN Sheldon Standing, MD       timolol (TIMOPTIC) 0.5 % ophthalmic solution 1 drop  1 drop Both Eyes Daily Sheldon Standing, MD   1 drop at 10/30/23 0907     Allergies  Allergen Reactions   Ezetimibe Other (See Comments)    Unknown   Gabapentin  Nausea And Vomiting    N/V and headaches   Iodinated Contrast Media Other (See Comments)    Feels faint, coughing, bp elevated   Mesalamine Other (See Comments)    Unknown   Ozempic (0.25 Or 0.5 Mg-Dose) [Semaglutide(0.25 Or 0.5mg -Dos)] Nausea And Vomiting   Prilosec [Omeprazole ]     Made legs hurt, hair fell out, and nauseated.    Statins  Other (See Comments)    Muscle spasms     Signed:   Standing KYM Sheldon, MD, FACS, MASCRS Esophageal, Gastrointestinal & Colorectal Surgery Robotic and Minimally Invasive Surgery  [facility] [address], [address] [place], [place] [address] [phone] Fax [phone] Main  CONTACT INFORMATION: Weekday (9AM-5PM): Call [facility] main office at [phone] Weeknight (5PM-9AM) or Weekend/Holiday: Check EPIC Web Links tab & use AMION (password  [email or web address]) for General Surgery [facility] coverage  Please, DO NOT use SecureChat  (it is not reliable communication to reach operating surgeons & will lead to a delay in care).   [facility] staff messaging available for outpatient concerns needing 1-2 business day response.      10/31/2023, 7:26 AM

## 2023-11-06 DIAGNOSIS — Z932 Ileostomy status: Secondary | ICD-10-CM | POA: Diagnosis not present

## 2023-11-06 DIAGNOSIS — Z9889 Other specified postprocedural states: Secondary | ICD-10-CM | POA: Diagnosis not present

## 2023-11-06 DIAGNOSIS — Z8719 Personal history of other diseases of the digestive system: Secondary | ICD-10-CM | POA: Diagnosis not present

## 2023-11-06 DIAGNOSIS — R7309 Other abnormal glucose: Secondary | ICD-10-CM | POA: Diagnosis not present

## 2023-11-06 DIAGNOSIS — R143 Flatulence: Secondary | ICD-10-CM | POA: Diagnosis not present

## 2023-11-08 ENCOUNTER — Encounter (HOSPITAL_COMMUNITY): Payer: Self-pay | Admitting: Student

## 2023-11-08 ENCOUNTER — Inpatient Hospital Stay (HOSPITAL_COMMUNITY)

## 2023-11-08 ENCOUNTER — Other Ambulatory Visit: Payer: Self-pay

## 2023-11-08 ENCOUNTER — Inpatient Hospital Stay (HOSPITAL_COMMUNITY)
Admission: EM | Admit: 2023-11-08 | Discharge: 2023-11-12 | DRG: 920 | Disposition: A | Discharge status: Home Health | Source: Ambulatory Visit | Attending: Internal Medicine | Admitting: Internal Medicine

## 2023-11-08 ENCOUNTER — Emergency Department (HOSPITAL_COMMUNITY)

## 2023-11-08 DIAGNOSIS — Z7982 Long term (current) use of aspirin: Secondary | ICD-10-CM | POA: Diagnosis not present

## 2023-11-08 DIAGNOSIS — K651 Peritoneal abscess: Secondary | ICD-10-CM | POA: Diagnosis not present

## 2023-11-08 DIAGNOSIS — Z85038 Personal history of other malignant neoplasm of large intestine: Secondary | ICD-10-CM

## 2023-11-08 DIAGNOSIS — Z91041 Radiographic dye allergy status: Secondary | ICD-10-CM | POA: Diagnosis not present

## 2023-11-08 DIAGNOSIS — K435 Parastomal hernia without obstruction or  gangrene: Secondary | ICD-10-CM | POA: Diagnosis present

## 2023-11-08 DIAGNOSIS — T8141XA Infection following a procedure, superficial incisional surgical site, initial encounter: Secondary | ICD-10-CM | POA: Diagnosis not present

## 2023-11-08 DIAGNOSIS — Z9049 Acquired absence of other specified parts of digestive tract: Secondary | ICD-10-CM

## 2023-11-08 DIAGNOSIS — N1831 Chronic kidney disease, stage 3a: Secondary | ICD-10-CM | POA: Diagnosis present

## 2023-11-08 DIAGNOSIS — Z933 Colostomy status: Secondary | ICD-10-CM | POA: Diagnosis not present

## 2023-11-08 DIAGNOSIS — Z833 Family history of diabetes mellitus: Secondary | ICD-10-CM

## 2023-11-08 DIAGNOSIS — R188 Other ascites: Secondary | ICD-10-CM | POA: Diagnosis not present

## 2023-11-08 DIAGNOSIS — M96842 Postprocedural seroma of a musculoskeletal structure following a musculoskeletal system procedure: Principal | ICD-10-CM | POA: Diagnosis present

## 2023-11-08 DIAGNOSIS — R1011 Right upper quadrant pain: Secondary | ICD-10-CM | POA: Diagnosis not present

## 2023-11-08 DIAGNOSIS — Z79899 Other long term (current) drug therapy: Secondary | ICD-10-CM

## 2023-11-08 DIAGNOSIS — K9419 Other complications of enterostomy: Secondary | ICD-10-CM | POA: Diagnosis present

## 2023-11-08 DIAGNOSIS — M1990 Unspecified osteoarthritis, unspecified site: Secondary | ICD-10-CM | POA: Diagnosis present

## 2023-11-08 DIAGNOSIS — Z96652 Presence of left artificial knee joint: Secondary | ICD-10-CM | POA: Diagnosis present

## 2023-11-08 DIAGNOSIS — D509 Iron deficiency anemia, unspecified: Secondary | ICD-10-CM | POA: Diagnosis present

## 2023-11-08 DIAGNOSIS — Z932 Ileostomy status: Secondary | ICD-10-CM | POA: Diagnosis not present

## 2023-11-08 DIAGNOSIS — E785 Hyperlipidemia, unspecified: Secondary | ICD-10-CM | POA: Diagnosis not present

## 2023-11-08 DIAGNOSIS — G8929 Other chronic pain: Secondary | ICD-10-CM | POA: Diagnosis present

## 2023-11-08 DIAGNOSIS — R1319 Other dysphagia: Secondary | ICD-10-CM | POA: Diagnosis present

## 2023-11-08 DIAGNOSIS — Z8249 Family history of ischemic heart disease and other diseases of the circulatory system: Secondary | ICD-10-CM

## 2023-11-08 DIAGNOSIS — R109 Unspecified abdominal pain: Secondary | ICD-10-CM | POA: Diagnosis not present

## 2023-11-08 DIAGNOSIS — D649 Anemia, unspecified: Secondary | ICD-10-CM

## 2023-11-08 DIAGNOSIS — Z888 Allergy status to other drugs, medicaments and biological substances status: Secondary | ICD-10-CM | POA: Diagnosis not present

## 2023-11-08 DIAGNOSIS — G4733 Obstructive sleep apnea (adult) (pediatric): Secondary | ICD-10-CM | POA: Diagnosis present

## 2023-11-08 DIAGNOSIS — K219 Gastro-esophageal reflux disease without esophagitis: Secondary | ICD-10-CM | POA: Diagnosis present

## 2023-11-08 DIAGNOSIS — I10 Essential (primary) hypertension: Secondary | ICD-10-CM | POA: Diagnosis present

## 2023-11-08 DIAGNOSIS — N281 Cyst of kidney, acquired: Secondary | ICD-10-CM | POA: Diagnosis not present

## 2023-11-08 DIAGNOSIS — N179 Acute kidney failure, unspecified: Secondary | ICD-10-CM | POA: Diagnosis not present

## 2023-11-08 DIAGNOSIS — T8149XA Infection following a procedure, other surgical site, initial encounter: Secondary | ICD-10-CM

## 2023-11-08 DIAGNOSIS — I129 Hypertensive chronic kidney disease with stage 1 through stage 4 chronic kidney disease, or unspecified chronic kidney disease: Secondary | ICD-10-CM | POA: Diagnosis not present

## 2023-11-08 DIAGNOSIS — Z8719 Personal history of other diseases of the digestive system: Secondary | ICD-10-CM

## 2023-11-08 DIAGNOSIS — Z433 Encounter for attention to colostomy: Secondary | ICD-10-CM | POA: Diagnosis not present

## 2023-11-08 DIAGNOSIS — K430 Incisional hernia with obstruction, without gangrene: Secondary | ICD-10-CM | POA: Diagnosis present

## 2023-11-08 LAB — COMPREHENSIVE METABOLIC PANEL WITH GFR
ALT: 18 U/L (ref 0–44)
AST: 24 U/L (ref 15–41)
Albumin: 3.3 g/dL — ABNORMAL LOW (ref 3.5–5.0)
Alkaline Phosphatase: 133 U/L — ABNORMAL HIGH (ref 38–126)
Anion gap: 12 (ref 5–15)
BUN: 63 mg/dL — ABNORMAL HIGH (ref 8–23)
CO2: 25 mmol/L (ref 22–32)
Calcium: 9 mg/dL (ref 8.9–10.3)
Chloride: 96 mmol/L — ABNORMAL LOW (ref 98–111)
Creatinine, Ser: 3.3 mg/dL — ABNORMAL HIGH (ref 0.44–1.00)
GFR, Estimated: 14 mL/min — ABNORMAL LOW (ref >60)
Glucose, Bld: 151 mg/dL — ABNORMAL HIGH (ref 70–99)
Potassium: 4.9 mmol/L (ref 3.5–5.1)
Sodium: 133 mmol/L — ABNORMAL LOW (ref 135–145)
Total Bilirubin: 0.5 mg/dL (ref 0.0–1.2)
Total Protein: 6.8 g/dL (ref 6.5–8.1)

## 2023-11-08 LAB — RETICULOCYTES
Immature Retic Fract: 18 % — ABNORMAL HIGH (ref 2.3–15.9)
RBC.: 2.43 MIL/uL — ABNORMAL LOW (ref 3.87–5.11)
Retic Count, Absolute: 37.9 K/uL (ref 19.0–186.0)
Retic Ct Pct: 1.6 % (ref 0.4–3.1)

## 2023-11-08 LAB — CBC WITH DIFFERENTIAL/PLATELET
Abs Immature Granulocytes: 0.03 K/uL (ref 0.00–0.07)
Basophils Absolute: 0 K/uL (ref 0.0–0.1)
Basophils Relative: 1 %
Eosinophils Absolute: 0.1 K/uL (ref 0.0–0.5)
Eosinophils Relative: 2 %
HCT: 22.5 % — ABNORMAL LOW (ref 36.0–46.0)
Hemoglobin: 7.6 g/dL — ABNORMAL LOW (ref 12.0–15.0)
Immature Granulocytes: 1 %
Lymphocytes Relative: 8 %
Lymphs Abs: 0.5 K/uL — ABNORMAL LOW (ref 0.7–4.0)
MCH: 29.2 pg (ref 26.0–34.0)
MCHC: 33.8 g/dL (ref 30.0–36.0)
MCV: 86.5 fL (ref 80.0–100.0)
Monocytes Absolute: 0.8 K/uL (ref 0.1–1.0)
Monocytes Relative: 14 %
Neutro Abs: 4.6 K/uL (ref 1.7–7.7)
Neutrophils Relative %: 74 %
Platelets: 433 K/uL — ABNORMAL HIGH (ref 150–400)
RBC: 2.6 MIL/uL — ABNORMAL LOW (ref 3.87–5.11)
RDW: 13.7 % (ref 11.5–15.5)
WBC: 6.1 K/uL (ref 4.0–10.5)
nRBC: 0 % (ref 0.0–0.2)

## 2023-11-08 LAB — URINALYSIS, ROUTINE W REFLEX MICROSCOPIC
Bilirubin Urine: NEGATIVE
Glucose, UA: NEGATIVE mg/dL
Hgb urine dipstick: NEGATIVE
Ketones, ur: NEGATIVE mg/dL
Leukocytes,Ua: NEGATIVE
Nitrite: NEGATIVE
Protein, ur: NEGATIVE mg/dL
Specific Gravity, Urine: 1.008 (ref 1.005–1.030)
pH: 5 (ref 5.0–8.0)

## 2023-11-08 LAB — FERRITIN: Ferritin: 467 ng/mL — ABNORMAL HIGH (ref 11–307)

## 2023-11-08 LAB — IRON AND TIBC
Iron: 15 ug/dL — ABNORMAL LOW (ref 28–170)
Saturation Ratios: 5 % — ABNORMAL LOW (ref 10.4–31.8)
TIBC: 273 ug/dL (ref 250–450)
UIBC: 259 ug/dL

## 2023-11-08 LAB — FOLATE: Folate: >20 ng/mL (ref >5.9)

## 2023-11-08 LAB — VITAMIN B12: Vitamin B-12: 1740 pg/mL — ABNORMAL HIGH (ref 180–914)

## 2023-11-08 LAB — POC OCCULT BLOOD, ED: Fecal Occult Bld: NEGATIVE

## 2023-11-08 MED ORDER — SODIUM CHLORIDE 0.9 % IV SOLN
2.0000 g | INTRAVENOUS | Status: DC
  Filled 2023-11-08: qty 12.5

## 2023-11-08 MED ORDER — PIPERACILLIN-TAZOBACTAM 3.375 G IVPB 30 MIN
3.3750 g | Freq: Once | INTRAVENOUS | Status: DC
  Filled 2023-11-08: qty 50

## 2023-11-08 MED ORDER — SODIUM CHLORIDE 0.9 % IV BOLUS
1000.0000 mL | Freq: Once | INTRAVENOUS | Status: AC
  Administered 2023-11-08: 1000 mL via INTRAVENOUS

## 2023-11-08 MED ORDER — ACETAMINOPHEN 650 MG RE SUPP: 650.0000 mg | Freq: Four times a day (QID) | RECTAL | Status: DC | PRN

## 2023-11-08 MED ORDER — TIMOLOL MALEATE 0.5 % OP SOLN
1.0000 [drp] | OPHTHALMIC | Status: DC
  Administered 2023-11-09 – 2023-11-12 (×4): 1 [drp] via OPHTHALMIC
  Filled 2023-11-08: qty 5

## 2023-11-08 MED ORDER — PHENOL 1.4 % MT LIQD: 2.0000 | OROMUCOSAL | Status: DC | PRN

## 2023-11-08 MED ORDER — HYDROMORPHONE HCL 1 MG/ML IJ SOLN: 0.5000 mg | INTRAMUSCULAR | Status: DC | PRN

## 2023-11-08 MED ORDER — ACETAMINOPHEN 500 MG PO TABS
1000.0000 mg | ORAL_TABLET | Freq: Four times a day (QID) | ORAL | Status: DC
  Administered 2023-11-08 – 2023-11-12 (×14): 1000 mg via ORAL
  Filled 2023-11-08 (×15): qty 2

## 2023-11-08 MED ORDER — METOCLOPRAMIDE HCL 5 MG PO TABS
5.0000 mg | ORAL_TABLET | Freq: Three times a day (TID) | ORAL | Status: DC
  Administered 2023-11-08 – 2023-11-12 (×15): 5 mg via ORAL
  Filled 2023-11-08 (×15): qty 1

## 2023-11-08 MED ORDER — METHOCARBAMOL 1000 MG/10ML IJ SOLN
1000.0000 mg | Freq: Four times a day (QID) | INTRAMUSCULAR | Status: DC | PRN
Start: 2023-11-08 — End: 2023-11-12
  Administered 2023-11-09: 1000 mg via INTRAVENOUS
  Filled 2023-11-08 (×2): qty 10

## 2023-11-08 MED ORDER — LATANOPROST 0.005 % OP SOLN
1.0000 [drp] | Freq: Every day | OPHTHALMIC | Status: DC
  Administered 2023-11-08 – 2023-11-11 (×4): 1 [drp] via OPHTHALMIC
  Filled 2023-11-08: qty 2.5

## 2023-11-08 MED ORDER — HYDROMORPHONE HCL 1 MG/ML IJ SOLN
0.5000 mg | INTRAMUSCULAR | Status: DC | PRN
  Administered 2023-11-08: 0.5 mg via INTRAVENOUS
  Filled 2023-11-08: qty 0.5

## 2023-11-08 MED ORDER — LACTATED RINGERS IV BOLUS
1000.0000 mL | Freq: Once | INTRAVENOUS | Status: AC
  Administered 2023-11-08: 1000 mL via INTRAVENOUS

## 2023-11-08 MED ORDER — ONDANSETRON HCL 4 MG/2ML IJ SOLN: 4.0000 mg | Freq: Four times a day (QID) | INTRAMUSCULAR | Status: DC | PRN

## 2023-11-08 MED ORDER — CALCIUM POLYCARBOPHIL 625 MG PO TABS
625.0000 mg | ORAL_TABLET | Freq: Two times a day (BID) | ORAL | Status: DC
  Administered 2023-11-08 – 2023-11-12 (×8): 625 mg via ORAL
  Filled 2023-11-08 (×8): qty 1

## 2023-11-08 MED ORDER — OXYCODONE HCL 5 MG PO TABS
5.0000 mg | ORAL_TABLET | Freq: Four times a day (QID) | ORAL | Status: DC | PRN
  Administered 2023-11-08 – 2023-11-09 (×4): 5 mg via ORAL
  Filled 2023-11-08 (×4): qty 1

## 2023-11-08 MED ORDER — SALINE SPRAY 0.65 % NA SOLN: 1.0000 | Freq: Four times a day (QID) | NASAL | Status: DC | PRN

## 2023-11-08 MED ORDER — ONDANSETRON HCL 4 MG PO TABS: 4.0000 mg | ORAL_TABLET | Freq: Four times a day (QID) | ORAL | Status: DC | PRN

## 2023-11-08 MED ORDER — MENTHOL 3 MG MT LOZG: 1.0000 | LOZENGE | OROMUCOSAL | Status: DC | PRN

## 2023-11-08 MED ORDER — ACETAMINOPHEN 325 MG PO TABS
650.0000 mg | ORAL_TABLET | Freq: Four times a day (QID) | ORAL | Status: DC | PRN
Start: 2023-11-08 — End: 2023-11-08

## 2023-11-08 MED ORDER — METRONIDAZOLE 500 MG/100ML IV SOLN
500.0000 mg | Freq: Two times a day (BID) | INTRAVENOUS | Status: DC
  Administered 2023-11-08 – 2023-11-09 (×2): 500 mg via INTRAVENOUS
  Filled 2023-11-08 (×2): qty 100

## 2023-11-08 MED ORDER — SODIUM CHLORIDE 0.9 % IV SOLN
2.0000 g | INTRAVENOUS | Status: DC
  Administered 2023-11-08: 2 g via INTRAVENOUS
  Filled 2023-11-08: qty 12.5

## 2023-11-08 MED ORDER — NAPHAZOLINE-GLYCERIN 0.012-0.25 % OP SOLN: 1.0000 [drp] | Freq: Four times a day (QID) | OPHTHALMIC | Status: DC | PRN

## 2023-11-08 MED ORDER — METRONIDAZOLE 500 MG/100ML IV SOLN
500.0000 mg | Freq: Two times a day (BID) | INTRAVENOUS | Status: DC
Start: 2023-11-08 — End: 2023-11-08

## 2023-11-08 MED ORDER — IRON SUCROSE 200 MG IVPB - SIMPLE MED
200.0000 mg | Freq: Once | Status: AC
  Administered 2023-11-08: 200 mg via INTRAVENOUS
  Filled 2023-11-08: qty 200

## 2023-11-08 MED ORDER — TRAZODONE HCL 50 MG PO TABS
50.0000 mg | ORAL_TABLET | Freq: Every day | ORAL | Status: DC
  Administered 2023-11-08 – 2023-11-11 (×4): 50 mg via ORAL
  Filled 2023-11-08 (×4): qty 1

## 2023-11-08 MED ORDER — LACTATED RINGERS IV BOLUS: 1000.0000 mL | Freq: Three times a day (TID) | INTRAVENOUS | Status: AC | PRN

## 2023-11-08 MED ORDER — FAMOTIDINE 20 MG PO TABS
20.0000 mg | ORAL_TABLET | Freq: Every day | ORAL | Status: DC
  Administered 2023-11-09 – 2023-11-12 (×4): 20 mg via ORAL
  Filled 2023-11-08 (×4): qty 1

## 2023-11-08 MED ORDER — METRONIDAZOLE 500 MG/100ML IV SOLN: 500.0000 mg | Freq: Two times a day (BID) | INTRAVENOUS | Status: DC

## 2023-11-08 MED ORDER — FENTANYL CITRATE (PF) 50 MCG/ML IJ SOSY
50.0000 ug | PREFILLED_SYRINGE | Freq: Once | INTRAMUSCULAR | Status: AC
  Administered 2023-11-08: 50 ug via INTRAVENOUS
  Filled 2023-11-08: qty 1

## 2023-11-08 MED ORDER — PROCHLORPERAZINE EDISYLATE 10 MG/2ML IJ SOLN: 5.0000 mg | INTRAMUSCULAR | Status: DC | PRN

## 2023-11-08 MED ORDER — CALCIUM CARBONATE 1250 (500 CA) MG PO TABS
2.0000 | ORAL_TABLET | Freq: Two times a day (BID) | ORAL | Status: DC
  Administered 2023-11-09 – 2023-11-12 (×6): 2500 mg via ORAL
  Filled 2023-11-08 (×7): qty 2

## 2023-11-08 MED ORDER — LACTATED RINGERS IV SOLN: INTRAVENOUS | Status: DC

## 2023-11-08 MED ORDER — ADULT MULTIVITAMIN W/MINERALS CH
1.0000 | ORAL_TABLET | Freq: Every day | ORAL | Status: DC
  Administered 2023-11-10: 1 via ORAL
  Filled 2023-11-08 (×2): qty 1

## 2023-11-08 MED ORDER — PANTOPRAZOLE SODIUM 40 MG PO TBEC: 40.0000 mg | DELAYED_RELEASE_TABLET | Freq: Two times a day (BID) | ORAL | Status: DC

## 2023-11-08 MED ORDER — MAGIC MOUTHWASH: 15.0000 mL | Freq: Four times a day (QID) | ORAL | Status: DC | PRN

## 2023-11-08 NOTE — Progress Notes (Addendum)
 Patient ID: Donna [name], female   DOB: [birth year], 78 y.o.   MRN: [id number]       Subjective: The patient is well known to Dr. Sheldon for recent surgery for a parastomal hernia with a ventral incisional abdominal wall hernia.  She has a history of a TAC with ileostomy for colon cancer.  She underwent a robotic component separation with repair of incisional and parastomal hernias with mesh, LOA, serosal repair x 2, and end ileostomy revision on 10/18/2023.  She overall did well post op and was discharged home on 10/31/2023.  Since that time she has been doing fairly well. Her cc is abdominal pain which she feels has gradually worsened since she went home one week ago. Pain more severe around her ileostomy.  She has had some intermittent nausea, but has trying to eat and drink to avoid dehydration; she is not eating and drinking much due to decreased appetite. She was noted to be slightly more confused yesterday and her PCP stopped her flexeril, which family feels helped her confusion/mental status greatly.  She also had labs drawn and was called today to come to the ED due to an elevation in her kidney function.  She denies any issues voiding and feels she is still voiding normal amounts.  She denies any vomiting or diarrhea.  Her ileostomy is productive.   She had denies fevers, chills, CP, SOB, etc.  Here in the ED she has been found to have a normal WBC at 6, but a drop in her hgb from 10.8  9 days ago to 7.6 today.  Her Cr is 3.3 and baseline appears around 1.  Her GFR is now 14.  She underwent a CT scan that reveals a 12x2cm RUQ fluid collection, abscess can not be ruled out.  We have been asked to see for surgical evaluation.  ROS: See above, otherwise other systems negative  Objective: Vital signs in last 24 hours: Temp:  [98.8 F (37.1 C)] 98.8 F (37.1 C) (11/07 1037) Pulse Rate:  [78] 78 (11/07 1037) Resp:  [15] 15 (11/07 1037) BP: (107)/(52) 107/52 (11/07 1037) SpO2:  [99 %] 99 %  (11/07 1037)    Intake/Output from previous day: No intake/output data recorded. Intake/Output this shift: No intake/output data recorded.  PE: General: pleasant, WD, WN black female who is laying in bed in NAD HEENT: head is normocephalic, atraumatic.  Sclera are noninjected.  PERRL.  Heart: regular, rate, and rhythm.  Normal s1,s2. No obvious murmurs, gallops, or rubs noted.  Palpable radial and pedal pulses bilaterally, no lower extremity edema  Lungs: CTAB, no wheezes, rhonchi, or rales noted.  Respiratory effort nonlabored Abd: soft, globally tender to light palpation with +rebound tenderness. Ileostomy productive of semisolid light brown stool with a red-tinge (patient reports eating tomato soup). No appreciable abdominal wall cellulitis. Skin under ileostomy appliance not examined.  Psych: A&Ox3 with an appropriate affect.   Lab Results:  Recent Labs    11/08/23 1114  WBC 6.1  HGB 7.6*  HCT 22.5*  PLT 433*   BMET Recent Labs    11/08/23 1114  NA 133*  K 4.9  CL 96*  CO2 25  GLUCOSE 151*  BUN 63*  CREATININE 3.30*  CALCIUM 9.0   PT/INR No results for input(s): LABPROT, INR in the last 72 hours. CMP     Component Value Date/Time   NA 133 (L) 11/08/2023 1114   NA 144 04/30/2022 0820   K 4.9 11/08/2023 1114  CL 96 (L) 11/08/2023 1114   CO2 25 11/08/2023 1114   GLUCOSE 151 (H) 11/08/2023 1114   BUN 63 (H) 11/08/2023 1114   BUN 24 04/30/2022 0820   CREATININE 3.30 (H) 11/08/2023 1114   CALCIUM 9.0 11/08/2023 1114   PROT 6.8 11/08/2023 1114   ALBUMIN 3.3 (L) 11/08/2023 1114   AST 24 11/08/2023 1114   ALT 18 11/08/2023 1114   ALKPHOS 133 (H) 11/08/2023 1114   BILITOT 0.5 11/08/2023 1114   GFRNONAA 14 (L) 11/08/2023 1114   GFRAA >60 06/04/2019 0413   Lipase     Component Value Date/Time   LIPASE 50 07/27/2016 0720       Studies/Results: CT ABDOMEN PELVIS WO CONTRAST Result Date: 11/08/2023 EXAM: CT ABDOMEN AND PELVIS WITHOUT CONTRAST  11/08/2023 12:27:23 PM TECHNIQUE: CT of the abdomen and pelvis was performed without the administration of intravenous contrast. Multiplanar reformatted images are provided for review. Automated exposure control, iterative reconstruction, and/or weight-based adjustment of the mA/kV was utilized to reduce the radiation dose to as low as reasonably achievable. COMPARISON: 10/24/2023 CLINICAL HISTORY: Abdominal pain, post-op. FINDINGS: LOWER CHEST: Mild right basilar atelectasis is noted. LIVER: The liver is unremarkable. GALLBLADDER AND BILE DUCTS: Pneumobilia is noted. No biliary ductal dilatation. SPLEEN: No acute abnormality. PANCREAS: No acute abnormality. ADRENAL GLANDS: No acute abnormality. KIDNEYS, URETERS AND BLADDER: No stones in the kidneys or ureters. No hydronephrosis. No perinephric or periureteral stranding. Urinary bladder is unremarkable. GI AND BOWEL: Colostomy noted in right lower quadrant. There is no bowel obstruction. PERITONEUM AND RETROPERITONEUM: Prostate fluid collection measuring 12x2 cm is noted in right upper quadrant concerning for possible abscess. This may extend to the colostomy site and into the right lower quadrant. No ascites. No free air. VASCULATURE: Aorta is normal in caliber. Aortic atherosclerosis. LYMPH NODES: No lymphadenopathy. REPRODUCTIVE ORGANS: No acute abnormality. BONES AND SOFT TISSUES: Findings consistent with prior ventral hernia repair. No acute osseous abnormality. No focal soft tissue abnormality. IMPRESSION: 1. right upper quadrant fluid collection measuring 12x2 cm, concerning for abscess, with potential extension to the colostomy site and into the right lower quadrant. 2. pneumobilia and probable biliary sludge. 3. Postoperative changes consistent with prior ventral hernia repair. Electronically signed by: Lynwood Seip MD 11/08/2023 01:58 PM EST RP Workstation: [id number]    Anti-infectives: Anti-infectives (From admission, onward)    Start     Dose/Rate  Route Frequency Ordered Stop   11/08/23 1500  piperacillin-tazobactam (ZOSYN) IVPB 3.375 g        3.375 g 100 mL/hr over 30 Minutes Intravenous  Once 11/08/23 1455          Assessment/Plan S/p robotic component separation with repair of incisional and parastomal hernias with mesh, LOA, serosal repair x 2, and end ileostomy revision on 10/18/2023 by Dr. Sheldon now with RUQ fluid collection -the patient is AF with a normal WBC, but the CT is concerning for hematoma vs abscess. There are also dots of free air near the ileostomy concerning for gas-forming fluid collection vs abnormal free air. Given her degree of abdominal tenderness on exam and the fluid around the small intestine as it enters the abdominal wall for stoma, cannot completely exclude fistula/intestinal leak. I have ordered CT w/ PO contrast to further assess. I reviewed her CT scan from 10/23 as well and there was also some RUQ fluid and similar dots of air on that CT as well. - will likely benefit from IR aspiration vs drainage but will discuss with Dr. Sheldon  and decide after CT w/ PO contrast  -mobilize -pulm toilet -routine ileostomy care  FEN - NPO for now VTE - SCD's  ID - Zosyn  ARF - per primary service Anemia - hgb down from 10.8 to 7.6.  monitor.  Transfuse as needed if falls below 7.  No evidence of bleeding on her scan but as stated above collection could be hematoma Pneumobilia on CT - previous ERCP so not unexpected GERD HLD HTN   I reviewed nursing notes, ED provider notes, hospitalist notes, last 24 h vitals and pain scores, last 48 h intake and output, last 24 h labs and trends, and last 24 h imaging results.   LOS: 0 days    Almarie Pringle , [facility] Surgery 11/08/2023, 3:07 PM Please see Amion for pager number during day hours 7:00am-4:30pm or 7:00am -11:30am on weekends

## 2023-11-08 NOTE — Progress Notes (Signed)
   11/08/23 1942  BiPAP/CPAP/SIPAP  BiPAP/CPAP/SIPAP Pt Type Adult  Reason BIPAP/CPAP not in use Non-compliant (pt refusing cpap for the night)

## 2023-11-08 NOTE — H&P (Signed)
 History and Physical    Patient: Donna [name] FMW:[id number] DOB: 02/25/1945 DOA: 11/08/2023 DOS: the patient was seen and examined on 11/08/2023 PCP: Clarice Nottingham, MD  Patient coming from: Home.  Son lives with her.  Independently ambulates at baseline.  Chief Complaint: Abnormal lab at PCP office  HPI: Donna [name] is a 78 y.o. female with PMH of ulcerative colitis, colon cancer s/p colectomy with ileostomy, CKD-3A, GERD, DDD, OSA and recent hospitalization from 10/17-10/30 for incarcerated abdominal wall hernia for which she had surgery/repair advised to go to ED due to elevated creatinine.  Patient reports ongoing abdominal pain since she had a procedure last month.  Not able to describe the nature of her pain.  She rates the pain 8-9 on a scale of 10 although she does not appear to be in that much distress.  She also reports poor p.o. intake and intermittent nausea.  She denies emesis.  Denies fever, chills, vomiting, diarrhea/increased ostomy output.  She denies melena or hematochezia.  She denies dysuria, frequency or urgency.  She has not noticed a change in the urine output.  He denies hematuria.  Denies NSAID use.  Patient lives at home.  His son lives with her.  Denies smoking cigarette, drinking alcohol or recreational drug use.  In ED, BP 107/52.  Other vital stable.  Hgb 7.6 (baseline 9-10).  WBC 6.1. Cr 3.3 (baseline 1.0-1.1).  BUN 63.  K4.9.  Na 133.  Hemoccult negative.  CT abdomen and pelvis raise concern for RUQ abscess measuring 12 x 2 cm and pneumobilia with possible biliary sludge.  Patient received 1 L bolus, fentanyl  and Zosyn.  General surgery consulted recommended admission to medicine service to see patient in consultation.   Review of Systems: As mentioned in the history of present illness. All other systems reviewed and are negative. Past Medical History:  Diagnosis Date   Anemia    Arthritis    Bulging lumbar disc    Cancer (HCC)    colon    [name] (HCC) 01/13/2015   Degeneration of lumbar or lumbosacral intervertebral disc 01/21/2015   Essential hypertension 01/21/2015   GERD (gastroesophageal reflux disease) 01/21/2015   Heart murmur    ECHO 05-17-2023   History of excision of intestinal structure 10/18/2023   Hyperlipidemia 01/21/2015   Hypertension    Pre-diabetes    diet   S/P total colectomy 05/29/2019   Ulcerative colitis [facility])    Past Surgical History:  Procedure Laterality Date   BIOPSY  06/29/2022   Procedure: BIOPSY;  Surgeon: Teresa Lonni HERO, MD;  Location: [facility];  Service: General;;   CESAREAN SECTION     COLON SURGERY  2015   for twisted bowel   ERCP N/A 07/10/2023   Procedure: ERCP, WITH INTERVENTION IF INDICATED;  Surgeon: Rosalie Kitchens, MD;  Location: [facility];  Service: Gastroenterology;  Laterality: N/A;   EYE SURGERY Right    cataract extraction   FLEXIBLE SIGMOIDOSCOPY N/A 05/18/2020   Procedure: SURVEILLANCE FLEXIBLE SIGMOIDOSCOPY;  Surgeon: Teresa Lonni HERO, MD;  Location: [facility];  Service: General;  Laterality: N/A;   FLEXIBLE SIGMOIDOSCOPY N/A 06/29/2022   Procedure: FLEXIBLE SIGMOIDOSCOPY;  Surgeon: Teresa Lonni HERO, MD;  Location: [facility];  Service: General;  Laterality: N/A;   FLEXIBLE SIGMOIDOSCOPY Left 08/29/2023   Procedure: KINGSTON SIDE;  Surgeon: Teresa Lonni HERO, MD;  Location: [facility];  Service: General;  Laterality: Left;   INCISIONAL HERNIA REPAIR N/A 05/29/2019   Procedure: PRIMARY INCISIONAL HERNIA REPAIR;  Surgeon: Teresa Lonni HERO, MD;  Location: [facility];  Service: General;  Laterality: N/A;   LAPAROTOMY Right 01/12/2015   Procedure: OPEN RIGHT COLECTOMY;  Surgeon: Donnice Bury, MD;  Location: [facility] OR;  Service: General;  Laterality: Right;   RIGHT COLECTOMY  01/12/2015   TOTAL KNEE ARTHROPLASTY Left 06/20/2018   Procedure: TOTAL KNEE ARTHROPLASTY;  Surgeon: Yvone Rush, MD;  Location: [facility];  Service:  Orthopedics;  Laterality: Left;   XI ROBOTIC ASSISTED PARASTOMAL HERNIA REPAIR N/A 10/18/2023   Procedure: REPAIR, HERNIA, PARASTOMAL, ROBOT-ASSISTED;  Surgeon: Sheldon Standing, MD;  Location: [facility];  Service: General;  Laterality: N/A;  ROBOTIC PARASTOMAL & INCISIONAL HERNIA REPAIRS WITH MESH   XI ROBOTIC ASSISTED VENTRAL HERNIA N/A 10/18/2023   Procedure: REPAIR, HERNIA, VENTRAL, ROBOT-ASSISTED;  Surgeon: Sheldon Standing, MD;  Location: [facility];  Service: General;  Laterality: N/A;   Social History:  reports that she has never smoked. She has been exposed to tobacco smoke. She has never used smokeless tobacco. She reports that she does not drink alcohol and does not use drugs.  Allergies  Allergen Reactions   Ezetimibe Other (See Comments)    Unknown   Gabapentin  Nausea And Vomiting    N/V and headaches   Iodinated Contrast Media Other (See Comments)    Feels faint, coughing, bp elevated   Mesalamine Other (See Comments)    Unknown   Ozempic (0.25 Or 0.5 Mg-Dose) [Semaglutide(0.25 Or 0.5mg -Dos)] Nausea And Vomiting   Prilosec [Omeprazole ]     Made legs hurt, hair fell out, and nauseated.    Statins Other (See Comments)    Muscle spasms     Family History  Problem Relation Age of Onset   Diabetes Mother    Ulcerative colitis Father    Heart failure Sister    Sleep apnea Son     Prior to Admission medications   Medication Sig Start Date End Date Taking? Authorizing Provider  acetaminophen  (TYLENOL ) 500 MG tablet Take 1,000 mg by mouth every 6 (six) hours as needed for mild pain or headache.    [provider]  amLODipine  (NORVASC ) 10 MG tablet Take 10 mg by mouth at bedtime. 05/20/18   [provider]  aspirin  EC 81 MG tablet Take 1 tablet (81 mg total) by mouth daily. Swallow whole. 03/20/21   Patwardhan, Newman PARAS, MD  B Complex Vitamins (B COMPLEX 100 PO) Take 1 tablet by mouth daily.    [provider]  benazepril  (LOTENSIN ) 40 MG tablet Take 40 mg by  mouth at bedtime. 05/14/18   [provider]  calcium carbonate (OS-CAL - DOSED IN MG OF ELEMENTAL CALCIUM) 1250 (500 Ca) MG tablet Take 2 tablets by mouth 2 (two) times daily with a meal.    [provider]  calcium carbonate (TUMS - DOSED IN MG ELEMENTAL CALCIUM) 500 MG chewable tablet Chew 1 tablet by mouth daily.    [provider]  cyclobenzaprine (FLEXERIL) 5 MG tablet Take 1 tablet (5 mg total) by mouth 3 (three) times daily as needed for muscle spasms. 10/18/23   Sheldon Standing, MD  Evolocumab  (REPATHA  SURECLICK) 140 MG/ML SOAJ Inject 140 mg (1 pen) into the skin every 14 days 07/08/23   Patwardhan, Newman PARAS, MD  famotidine (PEPCID) 20 MG tablet Take 20 mg by mouth daily.    [provider]  labetalol  (NORMODYNE ) 100 MG tablet TAKE 1 TABLET TWICE DAILY (NEED TO KEEP UPCOMING APPOINTMENT IN APRIL TO PREVENT ANY MISSED DOSES) 05/03/23  Patwardhan, Manish J, MD  latanoprost  (XALATAN ) 0.005 % ophthalmic solution Place 1 drop into both eyes at bedtime. 05/05/22   [provider]  loperamide  (IMODIUM ) 2 MG capsule Take 1 capsule (2 mg total) by mouth 2 (two) times daily. Patient taking differently: Take 4 mg by mouth in the morning, at noon, and at bedtime. 06/04/19   Teresa Lonni HERO, MD  Menthol-Methyl Salicylate (MUSCLE RUB) 10-15 % CREA Apply 1 application topically 2 (two) times daily as needed for muscle pain.    [provider]  metoCLOPramide  (REGLAN ) 5 MG tablet Take 1 tablet (5 mg total) by mouth every 8 (eight) hours as needed for nausea or vomiting. 10/31/23   Sheldon Standing, MD  Multiple Vitamin (MULTIVITAMIN WITH MINERALS) TABS tablet Take 1 tablet by mouth daily. Woman's one a day    [provider]  NON FORMULARY Pt uses a cpap nightly    [provider]  oxyCODONE -acetaminophen  (PERCOCET) 5-325 MG tablet Take 1 tablet by mouth every 6 (six) hours as needed for severe pain (pain score 7-10). Can increase to 2 pills at a  time if needed 10/30/23   Sheldon Standing, MD  pantoprazole  (PROTONIX ) 40 MG tablet Take 40 mg by mouth 2 (two) times daily before a meal.     [provider]  psyllium (HYDROCIL/METAMUCIL) 95 % PACK Take 1 packet by mouth daily. 10/30/23   Sheldon Standing, MD  timolol (TIMOPTIC) 0.5 % ophthalmic solution Place 1 drop into both eyes every morning. 06/17/22   [provider]  traZODone (DESYREL) 50 MG tablet Take 50 mg by mouth at bedtime. 02/11/23   [provider]    Physical Exam: Vitals:   11/08/23 1037 11/08/23 1519  BP: (!) 107/52 (!) 124/55  Pulse: 78 76  Resp: 15 16  Temp: 98.8 F (37.1 C) 98.3 F (36.8 C)  TempSrc: Oral Oral  SpO2: 99% 100%   GENERAL: No apparent distress.  Nontoxic. HEENT: MMM.  Vision and hearing grossly intact.  NECK: Supple.  No apparent JVD.  RESP:  No IWOB.  Fair aeration bilaterally. CVS:  RRR. Heart sounds normal.  ABD/GI/GU: BS+. Abd soft.  Diffuse tenderness.  No rebound or guarding.  Colostomy bag in place. MSK/EXT:   No apparent deformity. Moves extremities. No edema.  SKIN: no apparent skin lesion or wound NEURO: Awake and alert. Oriented x 4.  No apparent focal neuro deficit. PSYCH: Calm. Normal affect.  Data Reviewed: See HPI  Assessment and Plan: AKI/azotemia on CKD-3A: Likely prerenal.  She is also on Lotensin  40 mg daily at home.  Denies NSAID use.  Denies bladder habit change.  No signs of urinary pathology on CT.  Received 1 L bolus in ED. Recent Labs    10/14/23 1048 10/19/23 0535 10/20/23 0706 10/21/23 0427 10/22/23 0758 10/23/23 0513 10/24/23 0452 10/25/23 0453 10/27/23 0833 10/27/23 2359 10/28/23 0501 10/29/23 0457 10/30/23 0523 11/08/23 1114  BUN 18  --  28* 25* 30*  --   --   --  28*  --   --   --   --  63*  CREATININE 1.72*   < > 2.07* 1.71* 1.58* 1.09* 1.52* 1.43* 1.11* 1.08* 1.07* 1.05* 1.06* 3.30*   < > = values in this interval not displayed.  - Continue IV fluid at 100 cc an hour -  Avoid nephrotoxic meds.  Hold Lotensin . - Strict intake and output - Monitor renal function.   Possible intra-abdominal abscess: CT raise concern for 12  x 2 cm RUQ abscess.  Patient reports ongoing abdominal pain since her recent hospitalization.  No fever or leukocytosis.  Started on IV Zosyn in ED. - Will continue IV cefepime and Flagyl  - N.p.o. pending general surgery evaluation and recommendation.  Normocytic anemia: Baseline hemoglobin ranging from 9-10.  Denies melena, hematochezia or hematuria.  Hemoccult negative. Recent Labs    10/14/23 1048 10/19/23 0535 10/21/23 0427 10/22/23 0758 10/27/23 0833 10/28/23 0501 10/30/23 1235 11/08/23 1114  HGB 11.1* 11.3* 9.5* 8.6* 9.9* 9.8* 10.8* 7.6*  - Check anemia panel - Monitor H&H.  Transfuse for Hgb <7.0.  Verbally consented for transfusion if needed.   History of UC and colon cancer s/p colectomy end ileostomy - [facility]OCN consult for ileostomy care  DDD/chronic back pain: On Flexeril and Percocet at home. - Multimodal pain control with as needed Tylenol , oxycodone  and Dilaudid   GERD - Continue PPI  Hyponatremia: Mild.  Likely due to renal failure. - Continue monitoring     Advance Care Planning:   Code Status: Full Code -discussed with patient and patient's son at bedside  Consults: General Surgery  Family Communication: Updated patient's son at bedside  Severity of Illness: The appropriate patient status for this patient is INPATIENT. Inpatient status is judged to be reasonable and necessary in order to provide the required intensity of service to ensure the patient's safety. The patient's presenting symptoms, physical exam findings, and initial radiographic and laboratory data in the context of their chronic comorbidities is felt to place them at high risk for further clinical deterioration. Furthermore, it is not anticipated that the patient will be medically stable for discharge from the hospital within 2 midnights of  admission.   * I certify that at the point of admission it is my clinical judgment that the patient will require inpatient hospital care spanning beyond 2 midnights from the point of admission due to high intensity of service, high risk for further deterioration and high frequency of surveillance required.*  Author: Yolette Hastings T Daryana Whirley, MD 11/08/2023 4:05 PM  For on call review [email or web address].

## 2023-11-08 NOTE — ED Provider Notes (Signed)
 [place] EMERGENCY DEPARTMENT AT [facility] Provider Note   CSN: [id number] Arrival date & time: 11/08/23  1029     Patient presents with: No chief complaint on file.   Donna [name] is a 78 y.o. female.   Patient is a 78 year old female with a past medical history of hypertension, prior colon cancer status post colectomy with ostomy in place, recent procedure for incarcerated parastomal abdominal wall hernia complicated by SBO presenting to the emergency department with abnormal labs.  Patient states that she has had some postop abdominal pain since her procedure and some intermittent nausea.  She states that she has still been trying to make sure that she is eating and drinking enough each day.  She states that she had a follow-up appointment with her primary doctor yesterday and her son noted that she did seem to be more confused than usual.  She had been taking pain medication in addition to Flexeril and was told to stop taking the Flexeril.  Her son reports she had improvement of her confusion today upon stopping that.  She did receive a call from her primary doctor though that her kidney function was abnormal and was recommended to come to the emergency department for evaluation.  She states that she has still been urinating normally, denies any dysuria or hematuria.  Denies any vomiting or diarrhea.  Denies any fever.  The history is provided by the patient and a relative.       Prior to Admission medications   Medication Sig Start Date End Date Taking? Authorizing Provider  acetaminophen  (TYLENOL ) 500 MG tablet Take 1,000 mg by mouth every 6 (six) hours as needed for mild pain or headache.    [provider]  amLODipine  (NORVASC ) 10 MG tablet Take 10 mg by mouth at bedtime. 05/20/18   [provider]  aspirin  EC 81 MG tablet Take 1 tablet (81 mg total) by mouth daily. Swallow whole. 03/20/21   Patwardhan, Newman PARAS, MD  B Complex Vitamins (B  COMPLEX 100 PO) Take 1 tablet by mouth daily.    [provider]  benazepril  (LOTENSIN ) 40 MG tablet Take 40 mg by mouth at bedtime. 05/14/18   [provider]  calcium carbonate (OS-CAL - DOSED IN MG OF ELEMENTAL CALCIUM) 1250 (500 Ca) MG tablet Take 2 tablets by mouth 2 (two) times daily with a meal.    [provider]  calcium carbonate (TUMS - DOSED IN MG ELEMENTAL CALCIUM) 500 MG chewable tablet Chew 1 tablet by mouth daily.    [provider]  cyclobenzaprine (FLEXERIL) 5 MG tablet Take 1 tablet (5 mg total) by mouth 3 (three) times daily as needed for muscle spasms. 10/18/23   Sheldon Standing, MD  Evolocumab  (REPATHA  SURECLICK) 140 MG/ML SOAJ Inject 140 mg (1 pen) into the skin every 14 days 07/08/23   Patwardhan, Newman PARAS, MD  famotidine (PEPCID) 20 MG tablet Take 20 mg by mouth daily.    [provider]  labetalol  (NORMODYNE ) 100 MG tablet TAKE 1 TABLET TWICE DAILY (NEED TO KEEP UPCOMING APPOINTMENT IN APRIL TO PREVENT ANY MISSED DOSES) 05/03/23   Patwardhan, Manish J, MD  latanoprost  (XALATAN ) 0.005 % ophthalmic solution Place 1 drop into both eyes at bedtime. 05/05/22   [provider]  loperamide  (IMODIUM ) 2 MG capsule Take 1 capsule (2 mg total) by mouth 2 (two) times daily. Patient taking differently: Take 4 mg by mouth in the morning, at noon, and at bedtime. 06/04/19  Teresa Lonni HERO, MD  Menthol-Methyl Salicylate (MUSCLE RUB) 10-15 % CREA Apply 1 application topically 2 (two) times daily as needed for muscle pain.    [provider]  metoCLOPramide  (REGLAN ) 5 MG tablet Take 1 tablet (5 mg total) by mouth every 8 (eight) hours as needed for nausea or vomiting. 10/31/23   Sheldon Standing, MD  Multiple Vitamin (MULTIVITAMIN WITH MINERALS) TABS tablet Take 1 tablet by mouth daily. Woman's one a day    [provider]  NON FORMULARY Pt uses a cpap nightly    [provider]  oxyCODONE -acetaminophen  (PERCOCET) 5-325  MG tablet Take 1 tablet by mouth every 6 (six) hours as needed for severe pain (pain score 7-10). Can increase to 2 pills at a time if needed 10/30/23   Sheldon Standing, MD  pantoprazole  (PROTONIX ) 40 MG tablet Take 40 mg by mouth 2 (two) times daily before a meal.     [provider]  psyllium (HYDROCIL/METAMUCIL) 95 % PACK Take 1 packet by mouth daily. 10/30/23   Sheldon Standing, MD  timolol (TIMOPTIC) 0.5 % ophthalmic solution Place 1 drop into both eyes every morning. 06/17/22   [provider]  traZODone (DESYREL) 50 MG tablet Take 50 mg by mouth at bedtime. 02/11/23   [provider]    Allergies: Ezetimibe, Gabapentin , Iodinated contrast media, Mesalamine, Ozempic (0.25 or 0.5 mg-dose) [semaglutide(0.25 or 0.5mg -dos)], Prilosec [omeprazole ], and Statins    Review of Systems  Updated Vital Signs BP (!) 107/52 (BP Location: Left Arm)   Pulse 78   Temp 98.8 F (37.1 C) (Oral)   Resp 15   SpO2 99%   Physical Exam Vitals and nursing note reviewed.  Constitutional:      General: She is not in acute distress.    Appearance: Normal appearance.  HENT:     Head: Normocephalic and atraumatic.     Nose: Nose normal.     Mouth/Throat:     Mouth: Mucous membranes are moist.     Pharynx: Oropharynx is clear.  Eyes:     Extraocular Movements: Extraocular movements intact.     Conjunctiva/sclera: Conjunctivae normal.  Cardiovascular:     Rate and Rhythm: Normal rate and regular rhythm.     Heart sounds: Normal heart sounds.  Pulmonary:     Effort: Pulmonary effort is normal.     Breath sounds: Normal breath sounds.  Abdominal:     General: Abdomen is flat. There is distension.     Tenderness: There is abdominal tenderness (Diffuse).     Comments: Formed brown stool in ostomy bag  Musculoskeletal:        General: Normal range of motion.     Cervical back: Normal range of motion.  Skin:    General: Skin is warm and dry.  Neurological:     General: No focal  deficit present.     Mental Status: She is alert and oriented to person, place, and time.  Psychiatric:        Mood and Affect: Mood normal.        Behavior: Behavior normal.     (all labs ordered are listed, but only abnormal results are displayed) Labs Reviewed  CBC WITH DIFFERENTIAL/PLATELET - Abnormal; Notable for the following components:      Result Value   RBC 2.60 (*)    Hemoglobin 7.6 (*)    HCT 22.5 (*)    Platelets 433 (*)    Lymphs Abs 0.5 (*)    All  other components within normal limits  COMPREHENSIVE METABOLIC PANEL WITH GFR - Abnormal; Notable for the following components:   Sodium 133 (*)    Chloride 96 (*)    Glucose, Bld 151 (*)    BUN 63 (*)    Creatinine, Ser 3.30 (*)    Albumin 3.3 (*)    Alkaline Phosphatase 133 (*)    GFR, Estimated 14 (*)    All other components within normal limits  URINALYSIS, ROUTINE W REFLEX MICROSCOPIC  POC OCCULT BLOOD, ED    EKG: None  Radiology: CT ABDOMEN PELVIS WO CONTRAST Result Date: 11/08/2023 EXAM: CT ABDOMEN AND PELVIS WITHOUT CONTRAST 11/08/2023 12:27:23 PM TECHNIQUE: CT of the abdomen and pelvis was performed without the administration of intravenous contrast. Multiplanar reformatted images are provided for review. Automated exposure control, iterative reconstruction, and/or weight-based adjustment of the mA/kV was utilized to reduce the radiation dose to as low as reasonably achievable. COMPARISON: 10/24/2023 CLINICAL HISTORY: Abdominal pain, post-op. FINDINGS: LOWER CHEST: Mild right basilar atelectasis is noted. LIVER: The liver is unremarkable. GALLBLADDER AND BILE DUCTS: Pneumobilia is noted. No biliary ductal dilatation. SPLEEN: No acute abnormality. PANCREAS: No acute abnormality. ADRENAL GLANDS: No acute abnormality. KIDNEYS, URETERS AND BLADDER: No stones in the kidneys or ureters. No hydronephrosis. No perinephric or periureteral stranding. Urinary bladder is unremarkable. GI AND BOWEL: Colostomy noted in right  lower quadrant. There is no bowel obstruction. PERITONEUM AND RETROPERITONEUM: Prostate fluid collection measuring 12x2 cm is noted in right upper quadrant concerning for possible abscess. This may extend to the colostomy site and into the right lower quadrant. No ascites. No free air. VASCULATURE: Aorta is normal in caliber. Aortic atherosclerosis. LYMPH NODES: No lymphadenopathy. REPRODUCTIVE ORGANS: No acute abnormality. BONES AND SOFT TISSUES: Findings consistent with prior ventral hernia repair. No acute osseous abnormality. No focal soft tissue abnormality. IMPRESSION: 1. right upper quadrant fluid collection measuring 12x2 cm, concerning for abscess, with potential extension to the colostomy site and into the right lower quadrant. 2. pneumobilia and probable biliary sludge. 3. Postoperative changes consistent with prior ventral hernia repair. Electronically signed by: Lynwood Seip MD 11/08/2023 01:58 PM EST RP Workstation: [id number]     Procedures   Medications Ordered in the ED  fentaNYL  (SUBLIMAZE ) injection 50 mcg (50 mcg Intravenous Given 11/08/23 1421)  sodium chloride  0.9 % bolus 1,000 mL (1,000 mLs Intravenous New Bag/Given 11/08/23 1424)    Clinical Course as of 11/08/23 1449  Fri Nov 08, 2023  1419 CTAP with fluid collection in RUQ concerning for abscess. Will consult general surgery. Hemoccult negative. [VK]  1445 I spoke with Vertell with general surgery who recommends medicine admission and will consult. Recommends Zosyn for abx. [VK]    Clinical Course User Index [VK] Kingsley, Evaan Tidwell K, DO                                 Medical Decision Making This patient presents to the ED with chief complaint(s) of postop pain, abnormal labs with pertinent past medical history of colon cancer status post colectomy with ostomy in place and recent procedure for incarcerated parastomal abdominal wall hernia status post repair, hypertension which further complicates the presenting complaint.  The complaint involves an extensive differential diagnosis and also carries with it a high risk of complications and morbidity.    The differential diagnosis includes dehydration, electrolyte abnormality, anemia, GI bleed, postop infection, SBO, renal obstruction, medication side effect  Additional  history obtained: Additional history obtained from family Records reviewed previous admission documents and Primary Care Documents  ED Course and Reassessment: On patient's arrival she has mildly soft blood pressures and otherwise is stable in no acute distress.  She did have labs performed in triage that showed an AKI with a creatinine of 3.3 from her baseline of 1 about 10 days ago as well as anemia with a hemoglobin of 7.6 from baseline around 10 at time of discharge.  Patient will additionally have a Hemoccult and urine performed as well as a CT to evaluate for any renal obstruction or other postop complications contributing to her pain.  She will be given fluids and pain control and will be closely reassessed.  Independent labs interpretation:  The following labs were independently interpreted: acute AKI and anemia  Independent visualization of imaging: - I independently visualized the following imaging with scope of interpretation limited to determining acute life threatening conditions related to emergency care: CTAP, which revealed concern for 12x2 cm abscess in RUQ  Consultation: - Consulted or discussed management/test interpretation w/ external professional: general surgery, hospitalist  Consideration for admission or further workup: patient requires admission for AKI and abscess Social Determinants of health: N/A   Amount and/or Complexity of Data Reviewed Labs: ordered. Radiology: ordered.  Risk Prescription drug management. Decision regarding hospitalization.       Final diagnoses:  AKI (acute kidney injury)  Post-operative wound abscess    ED Discharge Orders      None          Ellouise Richerd POUR, DO 11/08/23 1449

## 2023-11-08 NOTE — Progress Notes (Signed)
 Pt does not have her CPAP machine she states she has not worn it due to her doctor stating her not healing is due to her CPAP

## 2023-11-08 NOTE — ED Triage Notes (Signed)
 Had labs drawn yesterday and her kidney function tests were elevated and GFR was 6. Recent admission to the hospital for hernia surgery. Felt ok yesterday but does not feel well today.

## 2023-11-08 NOTE — Progress Notes (Signed)
 ED Pharmacy Antibiotic Sign Off An antibiotic consult was received from an ED provider for Zosyn per pharmacy dosing for intra-abdominal infection. A chart review was completed to assess appropriateness.   The following one time order(s) were placed:  Zosyn 3.375 g IV 30 min infusion  Further antibiotic and/or antibiotic pharmacy consults should be ordered by the admitting provider if indicated.   Thank you for allowing pharmacy to be a part of this patient's care.   Stefano MARLA Bologna, PharmD, BCPS Clinical Pharmacist 11/08/2023 2:55 PM

## 2023-11-09 ENCOUNTER — Inpatient Hospital Stay (HOSPITAL_COMMUNITY)

## 2023-11-09 DIAGNOSIS — N179 Acute kidney failure, unspecified: Secondary | ICD-10-CM | POA: Diagnosis not present

## 2023-11-09 DIAGNOSIS — R188 Other ascites: Secondary | ICD-10-CM

## 2023-11-09 LAB — CBC
HCT: 22.4 % — ABNORMAL LOW (ref 36.0–46.0)
Hemoglobin: 7.6 g/dL — ABNORMAL LOW (ref 12.0–15.0)
MCH: 29.9 pg (ref 26.0–34.0)
MCHC: 33.9 g/dL (ref 30.0–36.0)
MCV: 88.2 fL (ref 80.0–100.0)
Platelets: 376 K/uL (ref 150–400)
RBC: 2.54 MIL/uL — ABNORMAL LOW (ref 3.87–5.11)
RDW: 13.8 % (ref 11.5–15.5)
WBC: 5.3 K/uL (ref 4.0–10.5)
nRBC: 0 % (ref 0.0–0.2)

## 2023-11-09 LAB — COMPREHENSIVE METABOLIC PANEL WITH GFR
ALT: 15 U/L (ref 0–44)
AST: 25 U/L (ref 15–41)
Albumin: 3 g/dL — ABNORMAL LOW (ref 3.5–5.0)
Alkaline Phosphatase: 116 U/L (ref 38–126)
Anion gap: 11 (ref 5–15)
BUN: 47 mg/dL — ABNORMAL HIGH (ref 8–23)
CO2: 25 mmol/L (ref 22–32)
Calcium: 9.1 mg/dL (ref 8.9–10.3)
Chloride: 103 mmol/L (ref 98–111)
Creatinine, Ser: 2.07 mg/dL — ABNORMAL HIGH (ref 0.44–1.00)
GFR, Estimated: 24 mL/min — ABNORMAL LOW (ref >60)
Glucose, Bld: 104 mg/dL — ABNORMAL HIGH (ref 70–99)
Potassium: 4.3 mmol/L (ref 3.5–5.1)
Sodium: 139 mmol/L (ref 135–145)
Total Bilirubin: 0.6 mg/dL (ref 0.0–1.2)
Total Protein: 6.2 g/dL — ABNORMAL LOW (ref 6.5–8.1)

## 2023-11-09 LAB — PROTIME-INR
INR: 1 (ref 0.8–1.2)
Prothrombin Time: 14.2 s (ref 11.4–15.2)

## 2023-11-09 MED ORDER — FENTANYL CITRATE (PF) 100 MCG/2ML IJ SOLN
INTRAMUSCULAR | Status: AC | PRN
  Administered 2023-11-09: 50 ug via INTRAVENOUS

## 2023-11-09 MED ORDER — PIPERACILLIN-TAZOBACTAM 3.375 G IVPB
3.3750 g | Freq: Three times a day (TID) | INTRAVENOUS | Status: DC
Start: 2023-11-09 — End: 2023-11-14
  Administered 2023-11-09 – 2023-11-10 (×3): 3.375 g via INTRAVENOUS
  Filled 2023-11-09 (×3): qty 50

## 2023-11-09 MED ORDER — DIPHENHYDRAMINE HCL 50 MG/ML IJ SOLN
INTRAMUSCULAR | Status: AC
  Filled 2023-11-09: qty 1

## 2023-11-09 MED ORDER — FENTANYL CITRATE (PF) 100 MCG/2ML IJ SOLN
INTRAMUSCULAR | Status: AC
  Filled 2023-11-09: qty 2

## 2023-11-09 MED ORDER — MIDAZOLAM HCL (PF) 2 MG/2ML IJ SOLN
INTRAMUSCULAR | Status: AC | PRN
  Administered 2023-11-09: 1 mg via INTRAVENOUS

## 2023-11-09 MED ORDER — LACTATED RINGERS IV SOLN: INTRAVENOUS | Status: DC

## 2023-11-09 MED ORDER — ORAL CARE MOUTH RINSE: 15.0000 mL | OROMUCOSAL | Status: DC | PRN

## 2023-11-09 MED ORDER — MIDAZOLAM HCL 2 MG/2ML IJ SOLN
INTRAMUSCULAR | Status: AC
  Filled 2023-11-09: qty 2

## 2023-11-09 NOTE — Plan of Care (Signed)
  Problem: Education: Goal: Knowledge of General Education information will improve Description: Including pain rating scale, medication(s)/side effects and non-pharmacologic comfort measures Outcome: Progressing   Problem: Health Behavior/Discharge Planning: Goal: Ability to manage health-related needs will improve Outcome: Progressing   Problem: Clinical Measurements: Goal: Respiratory complications will improve Outcome: Progressing Goal: Cardiovascular complication will be avoided Outcome: Progressing   Problem: Coping: Goal: Level of anxiety will decrease Outcome: Progressing   Problem: Elimination: Goal: Will not experience complications related to bowel motility Outcome: Progressing Goal: Will not experience complications related to urinary retention Outcome: Progressing   Problem: Pain Managment: Goal: General experience of comfort will improve and/or be controlled Outcome: Progressing

## 2023-11-09 NOTE — Progress Notes (Signed)
 Discussed with Dr. Zollie with interventional radiology.  He suspects the patient's gallbladder is chronically minimally thickened and pneumobilia most likely related to ERCP.  He thinks that is a stable gallbladder and not particularly inflamed at this time.  She does not have any elevated liver function test nor is she having fevers or leukocytosis.  However she has having decreased appetite & symptoms that are hard to tease out.  We feel a reasonable compromise is to drain the abdominal wall fluid to see if it is truly hematoma.  If it is infected, then I would lean more towards draining the gallbladder as a possible source.  Just wish to be safe since mesh is nearby

## 2023-11-09 NOTE — Progress Notes (Signed)
 PROGRESS NOTE    Donna [name]  FMW:[id number] DOB: Sep 13, 1945 DOA: 11/08/2023 PCP: Clarice Nottingham, MD    Brief Narrative:  78 year old with history of ulcerative colitis, colon cancer status post colectomy end ileostomy, CKD stage IIIa, GERD, sleep apnea, recent hospitalization for incarcerated abdominal wall hernia underwent surgical repair.  She was sent to the emergency room with elevated creatinine on routine exam.  She was also complaining of ongoing pain right upper quadrant.  In the emergency room blood pressure is stable.  Hemoglobin 7.6, creatinine 3.3 with baseline creatinine of 1.1.  CT scan abdomen pelvis was concerning for right upper quadrant abscess and pneumobilia.  Started on antibiotics, IV fluids and pain medications and admitted to the hospital.  Subjective: Patient seen in the morning rounds.  She has moderate persistent pain right upper quadrant.  Remains afebrile.  Creatinine trending down.  On IV fluids.  Case discussed with general surgery and then with IR.  Plan for right upper quadrant percutaneous drainage today. Assessment & Plan:   AKI on CKD stage IIIa: Probably multifactorial.  Patient also on Lotensin  at home.  No hydronephrosis or obstruction on the CT scan.  Received IV fluid boluses.  Creatinine already trending down.  Continue IV fluid support, intake output monitoring and renal function monitoring. 3.3-2.07 today.  Intra-abdominal abscess/abdominal pain/pneumobilia that is likely chronic: Found to have 12 x 2 cm right upper quadrant fluid collection.  Hematoma versus abscess.  On IV Zosyn.  Followed by surgery and IR. For  percutaneous drainage today.  Normocytic anemia: Baseline hemoglobin 9-10.  Presented with hemoglobin of 7.6.  Was given 1 unit of IV iron.  Will continue to monitor.  Transfuse for less than 7.  History of ulcerative colitis, colon cancer status post colectomy end ileostomy: Surgery following.  GERD: On PPI.  DVT prophylaxis:  SCDs Start: 11/08/23 1516   Code Status: Full code Family Communication: None at the bedside Disposition Plan: Status is: Inpatient Remains inpatient appropriate because: IV antibiotics, IV fluids, inpatient procedures planned     Consultants:  General surgery Interventional radiology  Procedures:  Planned  Antimicrobials:  Zosyn 11/7---     Objective: Vitals:   11/08/23 1728 11/08/23 2001 11/09/23 0621 11/09/23 1050  BP: (!) 114/46 (!) 116/49 (!) 115/51 117/87  Pulse: 79 81 84 78  Resp: 18 17 16 15   Temp: 98.5 F (36.9 C) 99.4 F (37.4 C) 98.5 F (36.9 C)   TempSrc: Oral Oral Oral   SpO2: 97% 99% 97% 100%  Weight:      Height:        Intake/Output Summary (Last 24 hours) at 11/09/2023 1108 Last data filed at 11/09/2023 9177 Gross per 24 hour  Intake 1507.82 ml  Output 1785 ml  Net -277.18 ml   Filed Weights   11/08/23 1623  Weight: 83 kg    Examination:  General exam: Appears calm and comfortable.  Mildly anxious. Respiratory system: Clear to auscultation. Respiratory effort normal. Cardiovascular system: S1 & S2 heard, RRR.  Gastrointestinal system: Diffuse tenderness mostly on the right upper and lateral quadrants.  Ileostomy bag with loose stool. Central nervous system: Alert and oriented. No focal neurological deficits.     Data Reviewed: I have personally reviewed following labs and imaging studies  CBC: Recent Labs  Lab 11/08/23 1114 11/09/23 0542  WBC 6.1 5.3  NEUTROABS 4.6  --   HGB 7.6* 7.6*  HCT 22.5* 22.4*  MCV 86.5 88.2  PLT 433* 376   Basic  Metabolic Panel: Recent Labs  Lab 11/08/23 1114 11/09/23 0542  NA 133* 139  K 4.9 4.3  CL 96* 103  CO2 25 25  GLUCOSE 151* 104*  BUN 63* 47*  CREATININE 3.30* 2.07*  CALCIUM 9.0 9.1   GFR: Estimated Creatinine Clearance: 23.6 mL/min (A) (by C-G formula based on SCr of 2.07 mg/dL (H)). Liver Function Tests: Recent Labs  Lab 11/08/23 1114 11/09/23 0542  AST 24 25  ALT 18 15   ALKPHOS 133* 116  BILITOT 0.5 0.6  PROT 6.8 6.2*  ALBUMIN 3.3* 3.0*   No results for input(s): LIPASE, AMYLASE in the last 168 hours. No results for input(s): AMMONIA in the last 168 hours. Coagulation Profile: Recent Labs  Lab 11/09/23 0542  INR 1.0   Cardiac Enzymes: No results for input(s): CKTOTAL, CKMB, CKMBINDEX, TROPONINI in the last 168 hours. BNP (last 3 results) No results for input(s): PROBNP in the last 8760 hours. HbA1C: No results for input(s): HGBA1C in the last 72 hours. CBG: No results for input(s): GLUCAP in the last 168 hours. Lipid Profile: No results for input(s): CHOL, HDL, LDLCALC, TRIG, CHOLHDL, LDLDIRECT in the last 72 hours. Thyroid  Function Tests: No results for input(s): TSH, T4TOTAL, FREET4, T3FREE, THYROIDAB in the last 72 hours. Anemia Panel: Recent Labs    11/08/23 1632  VITAMINB12 1,740*  FOLATE >20.0  FERRITIN 467*  TIBC 273  IRON 15*  RETICCTPCT 1.6   Sepsis Labs: No results for input(s): PROCALCITON, LATICACIDVEN in the last 168 hours.  No results found for this or any previous visit (from the past 240 hours).       Radiology Studies: CT ABDOMEN PELVIS WO CONTRAST Result Date: 11/08/2023 EXAM: CT ABDOMEN AND PELVIS WITHOUT CONTRAST 11/08/2023 10:55:00 PM TECHNIQUE: CT of the abdomen and pelvis was performed without the administration of intravenous contrast. Multiplanar reformatted images are provided for review. Automated exposure control, iterative reconstruction, and/or weight-based adjustment of the mA/kV was utilized to reduce the radiation dose to as low as reasonably achievable. COMPARISON: CT abdomen and pelvis 11/08/2023. CLINICAL HISTORY: Abdominal pain, post-op. FINDINGS: LOWER CHEST: There is atelectasis in the right lower lobe. LIVER: The liver is unremarkable. GALLBLADDER AND BILE DUCTS: Pneumobilia again seen within the gallbladder and bile ducts. Gallstones are again  present. There is some new gallbladder wall thickening diffusely. SPLEEN: No acute abnormality. PANCREAS: No acute abnormality. ADRENAL GLANDS: Low-density right adrenal nodule appears unchanged measuring 16 mm, likely abnormal. Left adrenal gland is within normal limits. KIDNEYS, URETERS AND BLADDER: There is a 13 mm left renal cyst. No stones in the kidneys or ureters. No hydronephrosis. No perinephric or periureteral stranding. Urinary bladder is unremarkable. GI AND BOWEL: Stomach demonstrates no acute abnormality. Right-sided ostomy is again seen. There is some wall thickening at the level of the ostomy. There is also some wall thickening of small bowel loops in the right anterior abdomen. There is no pneumatosis. There is no bowel obstruction. PERITONEUM AND RETROPERITONEUM: Anterior right upper quadrant fluid collection containing small foci of air, abutting the abdominal wall and ostomy, measures 13 x 1.4 x 17.5 cm and has not significantly changed. There is some punctate foci of air within the thickened right anterior lower abdominal wall which appears similar to prior. There is no gross free intraperitoneal air. No ascites. VASCULATURE: Aorta is normal in caliber. There are atherosclerotic calcifications of the aorta and iliac arteries. LYMPH NODES: No lymphadenopathy. REPRODUCTIVE ORGANS: No acute abnormality. BONES AND SOFT TISSUES: Scoliosis and degenerative change  of the spine persists. No acute osseous abnormality. No focal soft tissue abnormality. IMPRESSION: 1. New diffuse gallbladder wall thickening with gallstones and pneumobilia. Findings are concerning for cholecystitis. 2. New Wall thickening at the level of the right-sided ostomy and small bowel loops possibly reactive enteritis . 3. Anterior right upper quadrant fluid collection containing small foci of air is concerning for abscess and has not significantly changed. Electronically signed by: Greig Pique MD 11/08/2023 11:04 PM EST RP  Workstation: [id number]   CT ABDOMEN PELVIS WO CONTRAST Result Date: 11/08/2023 EXAM: CT ABDOMEN AND PELVIS WITHOUT CONTRAST 11/08/2023 12:27:23 PM TECHNIQUE: CT of the abdomen and pelvis was performed without the administration of intravenous contrast. Multiplanar reformatted images are provided for review. Automated exposure control, iterative reconstruction, and/or weight-based adjustment of the mA/kV was utilized to reduce the radiation dose to as low as reasonably achievable. COMPARISON: 10/24/2023 CLINICAL HISTORY: Abdominal pain, post-op. FINDINGS: LOWER CHEST: Mild right basilar atelectasis is noted. LIVER: The liver is unremarkable. GALLBLADDER AND BILE DUCTS: Pneumobilia is noted. No biliary ductal dilatation. SPLEEN: No acute abnormality. PANCREAS: No acute abnormality. ADRENAL GLANDS: No acute abnormality. KIDNEYS, URETERS AND BLADDER: No stones in the kidneys or ureters. No hydronephrosis. No perinephric or periureteral stranding. Urinary bladder is unremarkable. GI AND BOWEL: Colostomy noted in right lower quadrant. There is no bowel obstruction. PERITONEUM AND RETROPERITONEUM: Prostate fluid collection measuring 12x2 cm is noted in right upper quadrant concerning for possible abscess. This may extend to the colostomy site and into the right lower quadrant. No ascites. No free air. VASCULATURE: Aorta is normal in caliber. Aortic atherosclerosis. LYMPH NODES: No lymphadenopathy. REPRODUCTIVE ORGANS: No acute abnormality. BONES AND SOFT TISSUES: Findings consistent with prior ventral hernia repair. No acute osseous abnormality. No focal soft tissue abnormality. IMPRESSION: 1. right upper quadrant fluid collection measuring 12x2 cm, concerning for abscess, with potential extension to the colostomy site and into the right lower quadrant. 2. pneumobilia and probable biliary sludge. 3. Postoperative changes consistent with prior ventral hernia repair. Electronically signed by: Lynwood Seip MD 11/08/2023  01:58 PM EST RP Workstation: [id number]        Scheduled Meds:  acetaminophen   1,000 mg Oral Q6H   calcium carbonate  2 tablet Oral BID WC   famotidine  20 mg Oral Daily   latanoprost   1 drop Both Eyes QHS   metoCLOPramide   5 mg Oral TID AC & HS   multivitamin with minerals  1 tablet Oral Daily   polycarbophil  625 mg Oral BID   timolol  1 drop Both Eyes [id number]   traZODone  50 mg Oral QHS   Continuous Infusions:  lactated ringers      lactated ringers  100 mL/hr at 11/09/23 1106   piperacillin-tazobactam (ZOSYN)  IV       LOS: 1 day    Time spent: 51 minutes    Renato Applebaum, MD [facility]

## 2023-11-09 NOTE — Progress Notes (Signed)
 Ileostomy started leaking overnight. Cleaned area, patient and changed ileostomy bag. Changed patient's gown. Will continue to monitor.

## 2023-11-09 NOTE — Consult Note (Signed)
 Chief Complaint: Patient was seen in consultation today for abdominal wall fluid collection, with consideration for aspiration and possible drainage.  Referring Provider(s): Dr. Elspeth Schultze, MD  Supervising Physician: Karalee Beat  Patient Status: [facility] - In-pt  Patient is Full Code  History of Present Illness: Donna [name] is a 78 y.o. female  with PMHx notable for ulcerative colitis, rectal cancer, status post total colectomy, with colostomy in place, HTN, HLD, CKD stage III, OSA, prediabetes, anemia, GERD, heart murmur, arthritis, and others as delineated below.  Patient was recently hospitalized from 10/17 to 10/30 for incarcerated abdominal wall hernia, status post repair, and revision of colostomy.  Since discharge, patient reports persistent abdominal pain, recently 8-9 out of 10, with poor p.o. intake, and intermittent nausea without vomiting.  She presented to ED, and CT abdomen pelvis without contrast was notable for a right upper quadrant fluid collection measuring 12 x 2 cm, concerning for potential extension from the colostomy site and into the right lower quadrant.  Patient has a hostile abdomen, and was recommended for aspiration with possible drain placement.  The request was reviewed and approved by Dr. Karalee. Patient is tentatively scheduled for same in IR today.   Patient is alert and laying in bed, calm.  She continues to complain of moderate to severe and intermittent abdominal pain, within the area of her current colostomy.  She also notes intermittent baseline chronic back pain. Patient denies any fevers, headache, chest pain, SOB, cough, nausea, vomiting or bleeding.     Past Medical History:  Diagnosis Date   Anemia    Arthritis    Bulging lumbar disc    Cancer (HCC)    colon   [name] (HCC) 01/13/2015   Degeneration of lumbar or lumbosacral intervertebral disc 01/21/2015   Essential hypertension 01/21/2015   GERD  (gastroesophageal reflux disease) 01/21/2015   Heart murmur    ECHO 05-17-2023   History of excision of intestinal structure 10/18/2023   Hyperlipidemia 01/21/2015   Hypertension    Pre-diabetes    diet   S/P total colectomy 05/29/2019   Ulcerative colitis [facility])     Past Surgical History:  Procedure Laterality Date   BIOPSY  06/29/2022   Procedure: BIOPSY;  Surgeon: Teresa Lonni HERO, MD;  Location: [facility];  Service: General;;   CESAREAN SECTION     COLON SURGERY  2015   for twisted bowel   ERCP N/A 07/10/2023   Procedure: ERCP, WITH INTERVENTION IF INDICATED;  Surgeon: Rosalie Kitchens, MD;  Location: [facility];  Service: Gastroenterology;  Laterality: N/A;   EYE SURGERY Right    cataract extraction   FLEXIBLE SIGMOIDOSCOPY N/A 05/18/2020   Procedure: SURVEILLANCE FLEXIBLE SIGMOIDOSCOPY;  Surgeon: Teresa Lonni HERO, MD;  Location: [facility];  Service: General;  Laterality: N/A;   FLEXIBLE SIGMOIDOSCOPY N/A 06/29/2022   Procedure: FLEXIBLE SIGMOIDOSCOPY;  Surgeon: Teresa Lonni HERO, MD;  Location: [facility];  Service: General;  Laterality: N/A;   FLEXIBLE SIGMOIDOSCOPY Left 08/29/2023   Procedure: KINGSTON SIDE;  Surgeon: Teresa Lonni HERO, MD;  Location: [facility];  Service: General;  Laterality: Left;   INCISIONAL HERNIA REPAIR N/A 05/29/2019   Procedure: PRIMARY INCISIONAL HERNIA REPAIR;  Surgeon: Teresa Lonni HERO, MD;  Location: [facility];  Service: General;  Laterality: N/A;   LAPAROTOMY Right 01/12/2015   Procedure: OPEN RIGHT COLECTOMY;  Surgeon: Donnice Bury, MD;  Location: [facility] OR;  Service: General;  Laterality: Right;   RIGHT COLECTOMY  01/12/2015  TOTAL KNEE ARTHROPLASTY Left 06/20/2018   Procedure: TOTAL KNEE ARTHROPLASTY;  Surgeon: Yvone Rush, MD;  Location: [facility];  Service: Orthopedics;  Laterality: Left;   XI ROBOTIC ASSISTED PARASTOMAL HERNIA REPAIR N/A 10/18/2023   Procedure: REPAIR, HERNIA, PARASTOMAL, ROBOT-ASSISTED;   Surgeon: Sheldon Standing, MD;  Location: [facility];  Service: General;  Laterality: N/A;  ROBOTIC PARASTOMAL & INCISIONAL HERNIA REPAIRS WITH MESH   XI ROBOTIC ASSISTED VENTRAL HERNIA N/A 10/18/2023   Procedure: REPAIR, HERNIA, VENTRAL, ROBOT-ASSISTED;  Surgeon: Sheldon Standing, MD;  Location: [facility];  Service: General;  Laterality: N/A;    Allergies: Ozempic (0.25 or 0.5 mg-dose) [semaglutide(0.25 or 0.5mg -dos)], Ezetimibe, Gabapentin , Iodinated contrast media, Mesalamine, Prilosec [omeprazole ], and Statins  Medications: Prior to Admission medications   Medication Sig Start Date End Date Taking? Authorizing Provider  acetaminophen  (TYLENOL ) 500 MG tablet Take 1,000 mg by mouth every 6 (six) hours as needed for mild pain or headache.   Yes [provider]  amLODipine  (NORVASC ) 10 MG tablet Take 10 mg by mouth at bedtime. 05/20/18  Yes [provider]  aspirin  EC 81 MG tablet Take 1 tablet (81 mg total) by mouth daily. Swallow whole. 03/20/21  Yes Patwardhan, Manish J, MD  B Complex Vitamins (B COMPLEX 100 PO) Take 1 tablet by mouth daily.   Yes [provider]  benazepril  (LOTENSIN ) 40 MG tablet Take 40 mg by mouth at bedtime. 05/14/18  Yes [provider]  calcium carbonate (OS-CAL - DOSED IN MG OF ELEMENTAL CALCIUM) 1250 (500 Ca) MG tablet Take 2 tablets by mouth 2 (two) times daily with a meal.   Yes [provider]  cyclobenzaprine (FLEXERIL) 5 MG tablet Take 1 tablet (5 mg total) by mouth 3 (three) times daily as needed for muscle spasms. 10/18/23  Yes Sheldon Standing, MD  Evolocumab  (REPATHA  SURECLICK) 140 MG/ML SOAJ Inject 140 mg (1 pen) into the skin every 14 days 07/08/23  Yes Patwardhan, Manish J, MD  famotidine (PEPCID) 20 MG tablet Take 20 mg by mouth daily.   Yes [provider]  hydrochlorothiazide  (HYDRODIURIL ) 25 MG tablet Take 25 mg by mouth daily.   Yes [provider]  labetalol  (NORMODYNE ) 100 MG tablet TAKE 1 TABLET TWICE  DAILY (NEED TO KEEP UPCOMING APPOINTMENT IN APRIL TO PREVENT ANY MISSED DOSES) Patient taking differently: Take 100 mg by mouth 2 (two) times daily. 05/03/23  Yes Patwardhan, Manish J, MD  latanoprost  (XALATAN ) 0.005 % ophthalmic solution Place 1 drop into both eyes at bedtime. 05/05/22  Yes [provider]  loperamide  (IMODIUM ) 2 MG capsule Take 1 capsule (2 mg total) by mouth 2 (two) times daily. Patient taking differently: Take 4 mg by mouth in the morning, at noon, and at bedtime. 06/04/19  Yes Teresa Lonni HERO, MD  Menthol-Methyl Salicylate (MUSCLE RUB) 10-15 % CREA Apply 1 application topically 2 (two) times daily as needed for muscle pain.   Yes [provider]  metoCLOPramide  (REGLAN ) 5 MG tablet Take 1 tablet (5 mg total) by mouth every 8 (eight) hours as needed for nausea or vomiting. 10/31/23  Yes Sheldon Standing, MD  Multiple Vitamin (MULTIVITAMIN WITH MINERALS) TABS tablet Take 1 tablet by mouth daily. Woman's one a day   Yes [provider]  oxyCODONE -acetaminophen  (PERCOCET) 5-325 MG tablet Take 1 tablet by mouth every 6 (six) hours as needed for severe pain (pain score 7-10). Can increase to 2 pills at a time if needed 10/30/23  Yes Sheldon Standing, MD  pantoprazole  (PROTONIX )  40 MG tablet Take 40 mg by mouth 2 (two) times daily before a meal.    Yes [provider]  psyllium (HYDROCIL/METAMUCIL) 95 % PACK Take 1 packet by mouth daily. 10/30/23  Yes Sheldon Standing, MD  timolol (TIMOPTIC) 0.5 % ophthalmic solution Place 1 drop into both eyes every morning. 06/17/22  Yes [provider]  traZODone (DESYREL) 50 MG tablet Take 50 mg by mouth at bedtime. 02/11/23  Yes [provider]     Family History  Problem Relation Age of Onset   Diabetes Mother    Ulcerative colitis Father    Heart failure Sister    Sleep apnea Son     Social History   Socioeconomic History   Marital status: Divorced    Spouse name: Not on file   Number of  children: 3   Years of education: Not on file   Highest education level: Not on file  Occupational History   Not on file  Tobacco Use   Smoking status: Never    Passive exposure: Past   Smokeless tobacco: Never  Vaping Use   Vaping status: Never Used  Substance and Sexual Activity   Alcohol use: No   Drug use: No   Sexual activity: Not Currently  Other Topics Concern   Not on file  Social History Narrative   Not on file   Social Drivers of Health   Financial Resource Strain: Not on file  Food Insecurity: No Food Insecurity (11/08/2023)   Hunger Vital Sign    Worried About Running Out of Food in the Last Year: Never true    Ran Out of Food in the Last Year: Never true  Transportation Needs: No Transportation Needs (11/08/2023)   PRAPARE - Administrator, Civil Service (Medical): No    Lack of Transportation (Non-Medical): No  Physical Activity: Not on file  Stress: Not on file  Social Connections: Moderately Integrated (11/08/2023)   Social Connection and Isolation Panel    Frequency of Communication with Friends and Family: Three times a week    Frequency of Social Gatherings with Friends and Family: Once a week    Attends Religious Services: More than 4 times per year    Active Member of [employer] or Organizations: Yes    Attends Banker Meetings: 1 to 4 times per year    Marital Status: Divorced     Review of Systems: A 12 point ROS discussed and pertinent positives are indicated in the HPI above.  All other systems are negative.  Vital Signs: BP (!) 115/51 (BP Location: Right Arm)   Pulse 84   Temp 98.5 F (36.9 C) (Oral)   Resp 16   Ht 5' 3 (1.6 m)   Wt 183 lb (83 kg)   SpO2 97%   BMI 32.42 kg/m   Advance Care Plan: The advanced care place/surrogate decision maker was discussed at the time of visit and the patient did not wish to discuss or was not able to name a surrogate decision maker or provide an advance care plan.  Physical  Exam Constitutional:      General: She is not in acute distress.    Appearance: Normal appearance.  HENT:     Mouth/Throat:     Mouth: Mucous membranes are dry.  Cardiovascular:     Rate and Rhythm: Normal rate and regular rhythm.     Pulses: Normal pulses.     Heart sounds: Normal heart sounds.  Pulmonary:  Effort: Pulmonary effort is normal.     Breath sounds: Normal breath sounds. No wheezing.  Abdominal:     General: Abdomen is flat.     Palpations: Abdomen is soft.     Tenderness: There is abdominal tenderness.     Comments: Current ostomy right of midline.  No obvious concerns with colostomy. Generalized moderate tenderness surrounding colostomy.  Musculoskeletal:        General: Normal range of motion.     Cervical back: Normal range of motion.  Skin:    General: Skin is warm and dry.  Neurological:     Mental Status: She is alert and oriented to person, place, and time.  Psychiatric:        Mood and Affect: Mood normal.        Behavior: Behavior normal.        Thought Content: Thought content normal.        Judgment: Judgment normal.     Imaging: CT ABDOMEN PELVIS WO CONTRAST Result Date: 11/08/2023 EXAM: CT ABDOMEN AND PELVIS WITHOUT CONTRAST 11/08/2023 10:55:00 PM TECHNIQUE: CT of the abdomen and pelvis was performed without the administration of intravenous contrast. Multiplanar reformatted images are provided for review. Automated exposure control, iterative reconstruction, and/or weight-based adjustment of the mA/kV was utilized to reduce the radiation dose to as low as reasonably achievable. COMPARISON: CT abdomen and pelvis 11/08/2023. CLINICAL HISTORY: Abdominal pain, post-op. FINDINGS: LOWER CHEST: There is atelectasis in the right lower lobe. LIVER: The liver is unremarkable. GALLBLADDER AND BILE DUCTS: Pneumobilia again seen within the gallbladder and bile ducts. Gallstones are again present. There is some new gallbladder wall thickening diffusely. SPLEEN:  No acute abnormality. PANCREAS: No acute abnormality. ADRENAL GLANDS: Low-density right adrenal nodule appears unchanged measuring 16 mm, likely abnormal. Left adrenal gland is within normal limits. KIDNEYS, URETERS AND BLADDER: There is a 13 mm left renal cyst. No stones in the kidneys or ureters. No hydronephrosis. No perinephric or periureteral stranding. Urinary bladder is unremarkable. GI AND BOWEL: Stomach demonstrates no acute abnormality. Right-sided ostomy is again seen. There is some wall thickening at the level of the ostomy. There is also some wall thickening of small bowel loops in the right anterior abdomen. There is no pneumatosis. There is no bowel obstruction. PERITONEUM AND RETROPERITONEUM: Anterior right upper quadrant fluid collection containing small foci of air, abutting the abdominal wall and ostomy, measures 13 x 1.4 x 17.5 cm and has not significantly changed. There is some punctate foci of air within the thickened right anterior lower abdominal wall which appears similar to prior. There is no gross free intraperitoneal air. No ascites. VASCULATURE: Aorta is normal in caliber. There are atherosclerotic calcifications of the aorta and iliac arteries. LYMPH NODES: No lymphadenopathy. REPRODUCTIVE ORGANS: No acute abnormality. BONES AND SOFT TISSUES: Scoliosis and degenerative change of the spine persists. No acute osseous abnormality. No focal soft tissue abnormality. IMPRESSION: 1. New diffuse gallbladder wall thickening with gallstones and pneumobilia. Findings are concerning for cholecystitis. 2. New Wall thickening at the level of the right-sided ostomy and small bowel loops possibly reactive enteritis . 3. Anterior right upper quadrant fluid collection containing small foci of air is concerning for abscess and has not significantly changed. Electronically signed by: Greig Pique MD 11/08/2023 11:04 PM EST RP Workstation: [id number]   CT ABDOMEN PELVIS WO CONTRAST Result Date:  11/08/2023 EXAM: CT ABDOMEN AND PELVIS WITHOUT CONTRAST 11/08/2023 12:27:23 PM TECHNIQUE: CT of the abdomen and pelvis was performed without the administration  of intravenous contrast. Multiplanar reformatted images are provided for review. Automated exposure control, iterative reconstruction, and/or weight-based adjustment of the mA/kV was utilized to reduce the radiation dose to as low as reasonably achievable. COMPARISON: 10/24/2023 CLINICAL HISTORY: Abdominal pain, post-op. FINDINGS: LOWER CHEST: Mild right basilar atelectasis is noted. LIVER: The liver is unremarkable. GALLBLADDER AND BILE DUCTS: Pneumobilia is noted. No biliary ductal dilatation. SPLEEN: No acute abnormality. PANCREAS: No acute abnormality. ADRENAL GLANDS: No acute abnormality. KIDNEYS, URETERS AND BLADDER: No stones in the kidneys or ureters. No hydronephrosis. No perinephric or periureteral stranding. Urinary bladder is unremarkable. GI AND BOWEL: Colostomy noted in right lower quadrant. There is no bowel obstruction. PERITONEUM AND RETROPERITONEUM: Prostate fluid collection measuring 12x2 cm is noted in right upper quadrant concerning for possible abscess. This may extend to the colostomy site and into the right lower quadrant. No ascites. No free air. VASCULATURE: Aorta is normal in caliber. Aortic atherosclerosis. LYMPH NODES: No lymphadenopathy. REPRODUCTIVE ORGANS: No acute abnormality. BONES AND SOFT TISSUES: Findings consistent with prior ventral hernia repair. No acute osseous abnormality. No focal soft tissue abnormality. IMPRESSION: 1. right upper quadrant fluid collection measuring 12x2 cm, concerning for abscess, with potential extension to the colostomy site and into the right lower quadrant. 2. pneumobilia and probable biliary sludge. 3. Postoperative changes consistent with prior ventral hernia repair. Electronically signed by: Lynwood Seip MD 11/08/2023 01:58 PM EST RP Workstation: [id number]   DG Abd Portable 1V-Small Bowel  Obstruction Protocol-initial, 8 hr delay Result Date: 10/27/2023 EXAM: CT ABDOMEN WITHOUT CONTRAST 10/27/2023 08:14:00 PM TECHNIQUE: CT of the abdomen was performed without the administration of intravenous contrast. Multiplanar reformatted images are provided for review. Automated exposure control, iterative reconstruction, and/or weight-based adjustment of the mA/kV was utilized to reduce the radiation dose to as low as reasonably achievable. COMPARISON: CT examination of October 24, 2023. CLINICAL HISTORY: Small bowel obstruction FINDINGS: LOWER CHEST: Visualized portion of the lower chest demonstrates no acute abnormality. HEPATOBILIARY: The visualized liver is unremarkable. Gallbladder is unremarkable. SPLEEN: Spleen demonstrates no acute abnormality. PANCREAS: Pancreas demonstrates no acute abnormality. ADRENAL GLANDS: Adrenal glands demonstrate no acute abnormality. KIDNEYS: No stones in the kidneys or proximal ureters. No hydronephrosis. No perinephric or periureteral stranding. GI AND BOWEL: A nasogastric tube is seen with loops within the expected gastric fundus. Numerous loops of dilated small bowel are seen throughout the abdomen, in keeping with a distal small bowel obstruction or adynamic ileus. Administered oral contrast opacifies the numerous loops of dilated small bowel but does not clearly extend to the terminal small bowel seen within the right lower quadrant, in the region of the ileostomy. PERITONEUM AND RETROPERITONEUM: No ascites or free air. Aorta is normal in caliber. LYMPH NODES: No lymphadenopathy. BONES AND SOFT TISSUES: Severe thoracolumbar levoscoliosis is noted. No acute abnormality of the visualized bones. No focal soft tissue abnormality. IMPRESSION: 1. Distal small bowel obstruction versus adynamic ileus, with oral contrast opacifying dilated small bowel loops but not clearly reaching the terminal small bowel in the right lower quadrant near the ileostomy; correlate clinically and  consider serial abdominal radiographs or repeat CT for progression. 2. Enteric tube terminates in the gastric fundus. 3. Severe thoracolumbar levoscoliosis again noted. Electronically signed by: Dorethia Molt MD 10/27/2023 08:19 PM EDT RP Workstation: [id number]   DG Abd Portable 1V Result Date: 10/27/2023 EXAM: 1 VIEW XRAY OF THE ABDOMEN 10/27/2023 03:11:00 AM CLINICAL HISTORY: Encounter for imaging study to confirm nasogastric (NG) tube placement [id number]. NG tube placement COMPARISON: 10/26/2023 FINDINGS:  BOWEL: Dilated gas-filled small bowel loops in abdomen up to 4.8 cm diameter, unchanged. Nonobstructive bowel gas pattern. SOFT TISSUES: Enteric tube in place with tip in gastric fundus. BONES: Stable severe levoconvex lumbar scoliosis. No acute osseous abnormality. IMPRESSION: 1. Dilated gas-filled small bowel loops in abdomen up to 4.8 cm diameter, unchanged. 2. Enteric tube in place with tip in gastric fundus. Electronically signed by: Dorethia Molt MD 10/27/2023 03:25 AM EDT RP Workstation: [id number]   DG Abd 1 View Result Date: 10/26/2023 CLINICAL DATA:  NG tube placement. EXAM: ABDOMEN - 1 VIEW COMPARISON:  CT 10/24/2023 FINDINGS: Tip and side port of the enteric tube in the left upper quadrant, likely within the stomach when compared with prior CT. Gaseous bowel distension centrally. Left the drains are partially included in the field of view. IMPRESSION: Tip and side port of the enteric tube in the left upper quadrant, likely within the stomach. Electronically Signed   By: Andrea Gasman M.D.   On: 10/26/2023 20:51   CT ABDOMEN PELVIS WO CONTRAST Result Date: 10/24/2023 CLINICAL DATA:  Abdominal pain postop peristomal and incisional hernia repairs. Evaluate for ileus versus small-bowel obstruction. EXAM: CT ABDOMEN AND PELVIS WITHOUT CONTRAST TECHNIQUE: Multidetector CT imaging of the abdomen and pelvis was performed following the standard protocol without IV contrast. RADIATION DOSE  REDUCTION: This exam was performed according to the departmental dose-optimization program which includes automated exposure control, adjustment of the mA and/or kV according to patient size and/or use of iterative reconstruction technique. COMPARISON:  Abdominopelvic CT 08/22/2023 and 01/30/2019. FINDINGS: Lower chest: Moderate subsegmental atelectasis at both lung bases. No significant pleural effusion or basilar pneumothorax. There is soft tissue emphysema within the anterior chest wall attributed to the recent surgery. Hepatobiliary: The liver is normal in density without suspicious focal abnormality on noncontrast imaging. Persistent pneumobilia, likely secondary to previous sphincterotomy. Dependent soft tissue in the gallbladder lumen may reflect noncalcified stones or sludge. No gallbladder wall thickening or surrounding inflammation. Pancreas: Unremarkable. No pancreatic ductal dilatation or surrounding inflammatory changes. Spleen: Normal in size without focal abnormality. Adrenals/Urinary Tract: Stable 2.1 cm right adrenal nodule measuring 14 HU this is compatible with an incidental adenoma based on stability from 2021. No specific follow-up imaging recommended. The left adrenal gland appears normal. No evidence of urinary tract calculus, suspicious renal lesion or hydronephrosis. Unchanged left renal cyst for which no specific follow-up imaging is recommended. The bladder is nearly empty and suboptimally evaluated. There is a small amount of air in the bladder lumen, likely secondary to recent instrumentation/catheterization. Stomach/Bowel: Enteric contrast has passed into the mid small bowel. The stomach appears unremarkable for its degree of distention. Mild diffuse small and large bowel distension with scattered air-fluid levels. No focal transition point identified. Appearance is most consistent with a postoperative ileus. No evidence of contrast leak. Vascular/Lymphatic: There are no enlarged  abdominal or pelvic lymph nodes. Aortic and branch vessel atherosclerosis without evidence of aneurysm. Reproductive: The uterus and ovaries appear unremarkable. No adnexal mass. Other: Interval ventral hernia repair with surgical drains in the anterior abdominal wall. There is a small amount of residual ill-defined fluid and air in the right lower quadrant abdominal wall adjacent to the ileostomy. Small left periumbilical air-fluid collection at site of repaired hernia measures 3.4 x 1.6 cm on image 57/2. There is air within the anterior abdominal wall attributed to the interval surgery. No focal intra-fluid collection or pneumoperitoneum identified. Musculoskeletal: No acute or significant osseous findings. Severe convex left thoracolumbar scoliosis with  associated multilevel spondylosis. IMPRESSION: 1. Interval ventral hernia repair with surgical drains in the anterior abdominal wall. There is a small amount of residual ill-defined fluid and air in the right lower quadrant abdominal wall adjacent to the ileostomy. Small left periumbilical air-fluid collection at site of repaired hernia. 2. Mild diffuse small and large bowel distension with scattered air-fluid levels, most consistent with a postoperative ileus. No focal transition point or other signs of bowel obstruction identified. Radiographic follow up recommended. 3. No focal intra-fluid collection or pneumoperitoneum identified. 4. Moderate bibasilar atelectasis. 5.  Aortic Atherosclerosis (ICD10-I70.0). Electronically Signed   By: Elsie Perone M.D.   On: 10/24/2023 16:47    Labs:  CBC: Recent Labs    10/28/23 0501 10/30/23 1235 11/08/23 1114 11/09/23 0542  WBC 10.6* 14.3* 6.1 5.3  HGB 9.8* 10.8* 7.6* 7.6*  HCT 29.7* 32.4* 22.5* 22.4*  PLT 471* 529* 433* 376    COAGS: Recent Labs    11/09/23 0542  INR 1.0    BMP: Recent Labs    10/22/23 0758 10/23/23 0513 10/27/23 0833 10/27/23 2359 10/29/23 0457 10/30/23 0523  11/08/23 1114 11/09/23 0542  NA 135  --  139  --   --   --  133* 139  K 4.0   < > 3.8   < > 3.8 3.1* 4.9 4.3  CL 103  --  99  --   --   --  96* 103  CO2 22  --  27  --   --   --  25 25  GLUCOSE 101*  --  125*  --   --   --  151* 104*  BUN 30*  --  28*  --   --   --  63* 47*  CALCIUM 9.0  --  10.2  --   --   --  9.0 9.1  CREATININE 1.58*   < > 1.11*   < > 1.05* 1.06* 3.30* 2.07*  GFRNONAA 34*   < > 51*   < > 55* 54* 14* 24*   < > = values in this interval not displayed.    LIVER FUNCTION TESTS: Recent Labs    11/08/23 1114 11/09/23 0542  BILITOT 0.5 0.6  AST 24 25  ALT 18 15  ALKPHOS 133* 116  PROT 6.8 6.2*  ALBUMIN 3.3* 3.0*    TUMOR MARKERS: No results for input(s): AFPTM, CEA, CA199, CHROMGRNA in the last 8760 hours.  Assessment and Plan: Patient is admitted with abdominal pain.  She was recently hospitalized for incarcerated abdominal wall hernia, status postrepair.  Patient presented to ED, and CT was notable for abdominal wall fluid collection.  IR was requested for aspiration and possible drain placement, if infectious.  Patient patient will tentatively present to IR today for abdominal wall fluid collection aspiration and possible drainage, possibly under moderate sedation.  Patient has been NPO since midnight.  All labs and medications are within acceptable parameters.  No pertinent allergies.   Risks and benefits discussed with the patient including bleeding, infection, damage to adjacent structures, bowel perforation/fistula connection, and sepsis.  All of the patient's questions were answered, patient is agreeable to proceed. Consent signed and in chart.     Thank you for allowing our service to participate in Donna [name] 's care.  Electronically Signed: Carlin DELENA Griffon, PA-C   11/09/2023, 10:47 AM      I spent a total of 40 Minutes in face to face in clinical consultation, greater than  50% of which was counseling/coordinating care  for abdominal wall fluid collection, with consideration for aspiration and possible drainage.

## 2023-11-09 NOTE — Progress Notes (Signed)
  IR BRIEF PROGRESS NOTE:  Ir was requested for abdominal wall fluid collection aspiration vs. drainage by Dr. Sheldon. IR attending, Dr. Karalee, has reviewed the request. Due to presence of minimal fluid collection on imaging, which does not appear abscess like, and without leukocytosis, Dr. Karalee recommends conservative care over the weekend and re-imaging on Monday. Please re-consult IR with any new requests. IR remains available.   Electronically Signed: Carlin DELENA Griffon, PA-C 11/09/2023, 8:36 AM

## 2023-11-09 NOTE — Progress Notes (Signed)
   11/09/23 2032  BiPAP/CPAP/SIPAP  BiPAP/CPAP/SIPAP Pt Type Adult  Reason BIPAP/CPAP not in use Non-compliant

## 2023-11-09 NOTE — Progress Notes (Signed)
 11/09/2023  Donna [name] [id number] 1945/11/04  CARE TEAM: PCP: Donna Nottingham, MD  Outpatient Care Team: Patient Care Team: Donna Nottingham, MD as PCP - General (Internal Medicine) Donna Newman PARAS, MD as PCP - Cardiology (Cardiology) Donna Lonni HERO, MD as Consulting Physician (Colon and Rectal Surgery) Donna Standing, MD as Consulting Physician (General Surgery) Donna Charleston, MD as Consulting Physician (Gastroenterology) Donna Newman PARAS, MD as Consulting Physician (Cardiology)  Inpatient Treatment Team: Treatment Team:  Donna Coria, MD [facility], Md, MD Donna Standing, MD Donna Pietro SAILOR, RN Leron Riis, [place] Bobbette Cartwright, MD Donna [name], [place] Donna Lauraine BROCKS, RN   Problem List:   Principal Problem:   AKI (acute kidney injury)   10/18/2023  POST-OPERATIVE DIAGNOSIS:  PARASTOMAL HERNIA AND VENTRAL INCISIONAL RECURRENT AND INCARCERATED ABDOMINAL WALL HERNIAE  Dimensions of hernia post-op:  16cm x 8cm   PROCEDURE:   ROBOTIC COMPONENT SEPARATION (transversus abdominis release = TAR) on right Repair of incarcerated recurrent incisional and parastomal hernias with mesh ROBOTIC LYSIS OF ADHESIONS SEROSAL REPAIR x2 END ILEOSTOMY REVISION TAP BLOCK - BILATERAL   SURGEON:  [name] KYM Sheldon, MD  OR FINDINGS:  Moderately dense adhesions small bowel to anterior abdominal wall and periumbilical midline hernia.  Swiss cheese hernias extending up to the subxiphoid region.  15 cm in length.  Moderate parastomal hernia around end ileostomy with a foot of small bowel densely adherent to the hernia sac.  Serosal repairs down x 2.  Ileostomy revision done for last 7 cm given poor tissues.   Type of ventral wall repair:  Laparoscopic underlay repair .with Primary repair of largest hernia Placement of mesh: Right lateral and flank retrorectus preperitoneal.  Midline intraperitoneal. Name of mesh: Bard Ventralight dual sided (polypropylene / Seprafilm) Size of  mesh: 33x27cm Orientation: Transverse Mesh overlap:  7cm    Assessment [name] Stay = 1 days)      Abdominal wall fluid collection with decrease in the open room and raises concern of hematoma.  Thickened gallbladder with gas raises concern of cholecystitis.  Plan:  There is no evidence of any obstruction nor ileus.  No evidence of any bowel leak.  No hernia recurrence.  Reassuring from that aspect.  Her however gallbladder is thickened with gas that raises concern of cholecystitis.  This may be explaining her persistent upper abdominal pain but she also has abdominal fluid collection.  Interventional radiology for drainage x 2:  Given the fact that she has a hostile abdomen with major surgery and inflammation, I do not think this is a good time to do cholecystectomy.  Would want a percutaneous cholecystostomy tube.  That may be a challenge since there is mesh nearby but hopefully they can gradually dilated periumbilical above the mesh or even tween the ribs to stay away from it.  No easy solutions here but I think the risk of bowel injury and other problems is much higher feculent with cholecystectomy now.  Then can regroup in 6 weeks when inflammation has gone down to plan interval cholecystectomy.  I think she needs a fluid collection abdominal to be sampled and needing a drain if possible..  With her drop in hemoglobin I suspect this is a hematoma with her being on aspirin .  However, she could have an infected hematoma or abscess she has cholecystitis   IV fluids.  Poor p.o. intake perhaps reflection more cholecystitis than anything else.    She is already has a fragile renal system with her chronic kidney disease.  Creatinine seems to be stabilizing and nonoliguric.  IV antibiotics.  I would favor of piperacillin/tazobactam for better abdominal wall and gallbladder coverage  Once drainage procedure is done, can try readvanced diet.  Has some history of nausea and possible  gastroparesis.  Scheduled Reglan .  Perhaps a lot of this is cholecystitis.  We will see.  Pain control.  Will add some oral regimens so she is less dependent on the IV Dilaudid .  Continue IV Robaxin  for now.  Tramadol  not sufficient.  Switch to oxycodone   Multimodal pain control.  Switch to PRN (as needed) but she fell behind.  Scheduled Tylenol .  Switch muscle relaxer to oral route.  Flexeril scheduled.  She cannot tolerate gabapentin .  Seem better controlled back on oxycodone  so we will use that.    -monitor electrolytes & replace as needed.  Corrected hypokalemia.  Keep K>4, Mg>2, Phos>3  Hypertension, GERD, sleep apnea, etc. per primary service often uses oxygen at night for sleep apnea..  -VTE prophylaxis- SCDs.  Anticoagulation prophyllaxis SQ as appropriate  -mobilize as tolerated to help recovery.  Enlist therapies in moderate/high risk patients as appropriate  I updated the patient's status to the patient  Recommendations were made.  Questions were answered.  She expressed understanding & appreciation.  -Disposition: To be determined     I reviewed nursing notes, last 24 h vitals and pain scores, last 48 h intake and output, last 24 h labs and trends, and last 24 h imaging results.  I have reviewed this patient's available data, including medical history, events of note, test results, etc as part of my evaluation.   A significant portion of that time was spent in counseling. Care during the described time interval was provided by me.  This care required moderate level of medical decision making.  11/09/2023    Subjective: (Chief complaint)  Patient still with soreness in her upper abdomen.  Not particularly nauseated but just decreased appetite and some discomfort when she eats or drinks.  CT scan done last night with contrast  Objective:  Vital signs:  Vitals:   11/08/23 1623 11/08/23 1728 11/08/23 2001 11/09/23 0621  BP:  (!) 114/46 (!) 116/49 (!) 115/51   Pulse:  79 81 84  Resp:  18 17 16   Temp:  98.5 F (36.9 C) 99.4 F (37.4 C) 98.5 F (36.9 C)  TempSrc:  Oral Oral Oral  SpO2:  97% 99% 97%  Weight: 83 kg     Height: 5' 3 (1.6 m)       Last BM Date : 11/09/23  Intake/Output   Yesterday:  11/07 0701 - 11/08 0700 In: 1507.8 [P.O.:100; I.V.:202.9; IV Piggyback:1204.9] Out: 1485 [Urine:900; Stool:585] This shift:  No intake/output data recorded.  Bowel function:  Flatus: YES  BM:  YES  Drain: (No drain)   Physical Exam:  General: Pt awake/alert in no acute distress.  Sitting up in chair.  Calm.  Relaxed.  Bright.  Alert. Eyes: PERRL, normal EOM.  Sclera clear.  No icterus Neuro: CN II-XII intact w/o focal sensory/motor deficits. Lymph: No head/neck/groin lymphadenopathy Psych:  No delerium/psychosis/paranoia.  Oriented x 4 HENT: Normocephalic, Mucus membranes moist.  No thrush Neck: Supple, No tracheal deviation.  No obvious thyromegaly Chest: No pain to chest wall compression.  Good respiratory excursion.  No audible wheezing CV:  Pulses intact.  Regular rhythm.  No major extremity edema MS: Normal AROM mjr joints.  No obvious deformity  Abdomen: Obese. Soft.  Nondistended.  Tenderness at right  upper quadrant with questionable Murphy sign.  Mild soreness just superior to her ileostomy.  Left upper quadrant and lower abdomen nontender..   Ileostomy in place with no edema.  Some Barium contrast in the bag.  Not consistent with pus Ext:   No deformity.  No mjr edema.  No cyanosis Skin: No petechiae / purpurea.  No major sores.  Warm and dry    Results:   Cultures: No results found for this or any previous visit (from the past 720 hours).  Labs: Results for orders placed or performed during the hospital encounter of 11/08/23 (from the past 48 hours)  CBC with Differential     Status: Abnormal   Collection Time: 11/08/23 11:14 AM  Result Value Ref Range   WBC 6.1 4.0 - 10.5 K/uL   RBC 2.60 (L) 3.87 - 5.11 MIL/uL    Hemoglobin 7.6 (L) 12.0 - 15.0 g/dL   HCT 77.4 (L) 63.9 - 53.9 %   MCV 86.5 80.0 - 100.0 fL   MCH 29.2 26.0 - 34.0 pg   MCHC 33.8 30.0 - 36.0 g/dL   RDW 86.2 88.4 - 84.4 %   Platelets 433 (H) 150 - 400 K/uL   nRBC 0.0 0.0 - 0.2 %   Neutrophils Relative % 74 %   Neutro Abs 4.6 1.7 - 7.7 K/uL   Lymphocytes Relative 8 %   Lymphs Abs 0.5 (L) 0.7 - 4.0 K/uL   Monocytes Relative 14 %   Monocytes Absolute 0.8 0.1 - 1.0 K/uL   Eosinophils Relative 2 %   Eosinophils Absolute 0.1 0.0 - 0.5 K/uL   Basophils Relative 1 %   Basophils Absolute 0.0 0.0 - 0.1 K/uL   Immature Granulocytes 1 %   Abs Immature Granulocytes 0.03 0.00 - 0.07 K/uL    Comment: Performed at [facility], [address] [place], [place] [address]  Comprehensive metabolic panel     Status: Abnormal   Collection Time: 11/08/23 11:14 AM  Result Value Ref Range   Sodium 133 (L) 135 - 145 mmol/L   Potassium 4.9 3.5 - 5.1 mmol/L   Chloride 96 (L) 98 - 111 mmol/L   CO2 25 22 - 32 mmol/L   Glucose, Bld 151 (H) 70 - 99 mg/dL    Comment: Glucose reference range applies only to samples taken after fasting for at least 8 hours.   BUN 63 (H) 8 - 23 mg/dL   Creatinine, Ser 6.69 (H) 0.44 - 1.00 mg/dL   Calcium 9.0 8.9 - 89.6 mg/dL   Total Protein 6.8 6.5 - 8.1 g/dL   Albumin 3.3 (L) 3.5 - 5.0 g/dL   AST 24 15 - 41 U/L   ALT 18 0 - 44 U/L   Alkaline Phosphatase 133 (H) 38 - 126 U/L   Total Bilirubin 0.5 0.0 - 1.2 mg/dL   GFR, Estimated 14 (L) >60 mL/min    Comment: (NOTE) Calculated using the CKD-EPI Creatinine Equation (2021)    Anion gap 12 5 - 15    Comment: Performed at [facility], [address], [place], [place] [address]  POC occult blood, ED     Status: None   Collection Time: 11/08/23 12:55 PM  Result Value Ref Range   Fecal Occult Bld NEGATIVE NEGATIVE  Urinalysis, Routine w reflex microscopic -Urine, Clean Catch     Status: Abnormal   Collection Time: 11/08/23  3:42 PM   Result Value Ref Range   Color, Urine YELLOW YELLOW  APPearance HAZY (A) CLEAR   Specific Gravity, Urine 1.008 1.005 - 1.030   pH 5.0 5.0 - 8.0   Glucose, UA NEGATIVE NEGATIVE mg/dL   Hgb urine dipstick NEGATIVE NEGATIVE   Bilirubin Urine NEGATIVE NEGATIVE   Ketones, ur NEGATIVE NEGATIVE mg/dL   Protein, ur NEGATIVE NEGATIVE mg/dL   Nitrite NEGATIVE NEGATIVE   Leukocytes,Ua NEGATIVE NEGATIVE    Comment: Performed at [facility], [address], [place], [place] [address]  Vitamin B12     Status: Abnormal   Collection Time: 11/08/23  4:32 PM  Result Value Ref Range   Vitamin B-12 1,740 (H) 180 - 914 pg/mL    Comment: Performed at [facility], [address], [place], [place] [address]  Folate     Status: None   Collection Time: 11/08/23  4:32 PM  Result Value Ref Range   Folate >20.0 >5.9 ng/mL    Comment: Performed at [facility], [address] [place], [place] [address]  Iron and TIBC     Status: Abnormal   Collection Time: 11/08/23  4:32 PM  Result Value Ref Range   Iron 15 (L) 28 - 170 ug/dL   TIBC 726 749 - 549 ug/dL   Saturation Ratios 5 (L) 10.4 - 31.8 %   UIBC 259 ug/dL    Comment: Performed at [facility], [address] [place], [place] [address]  Ferritin     Status: Abnormal   Collection Time: 11/08/23  4:32 PM  Result Value Ref Range   Ferritin 467 (H) 11 - 307 ng/mL    Comment: Performed at [facility], [address] [place], [place] [address]  Reticulocytes     Status: Abnormal   Collection Time: 11/08/23  4:32 PM  Result Value Ref Range   Retic Ct Pct 1.6 0.4 - 3.1 %   RBC. 2.43 (L) 3.87 - 5.11 MIL/uL   Retic Count, Absolute 37.9 19.0 - 186.0 K/uL   Immature Retic Fract 18.0 (H) 2.3 - 15.9 %    Comment: Performed at [facility], [address] [place], [place] [address]  Comprehensive metabolic panel     Status: Abnormal    Collection Time: 11/09/23  5:42 AM  Result Value Ref Range   Sodium 139 135 - 145 mmol/L   Potassium 4.3 3.5 - 5.1 mmol/L   Chloride 103 98 - 111 mmol/L   CO2 25 22 - 32 mmol/L   Glucose, Bld 104 (H) 70 - 99 mg/dL    Comment: Glucose reference range applies only to samples taken after fasting for at least 8 hours.   BUN 47 (H) 8 - 23 mg/dL   Creatinine, Ser 7.92 (H) 0.44 - 1.00 mg/dL    Comment: Delta check noted    Calcium 9.1 8.9 - 10.3 mg/dL   Total Protein 6.2 (L) 6.5 - 8.1 g/dL   Albumin 3.0 (L) 3.5 - 5.0 g/dL   AST 25 15 - 41 U/L   ALT 15 0 - 44 U/L   Alkaline Phosphatase 116 38 - 126 U/L   Total Bilirubin 0.6 0.0 - 1.2 mg/dL   GFR, Estimated 24 (L) >60 mL/min    Comment: (NOTE) Calculated using the CKD-EPI Creatinine Equation (2021)    Anion gap 11 5 - 15    Comment: Performed at [facility], [address] [place], [place] [address]  CBC     Status: Abnormal  Collection Time: 11/09/23  5:42 AM  Result Value Ref Range   WBC 5.3 4.0 - 10.5 K/uL   RBC 2.54 (L) 3.87 - 5.11 MIL/uL   Hemoglobin 7.6 (L) 12.0 - 15.0 g/dL   HCT 77.5 (L) 63.9 - 53.9 %   MCV 88.2 80.0 - 100.0 fL   MCH 29.9 26.0 - 34.0 pg   MCHC 33.9 30.0 - 36.0 g/dL   RDW 86.1 88.4 - 84.4 %   Platelets 376 150 - 400 K/uL   nRBC 0.0 0.0 - 0.2 %    Comment: Performed at [facility], [address], [place], [place] [address]  Protime-INR     Status: None   Collection Time: 11/09/23  5:42 AM  Result Value Ref Range   Prothrombin Time 14.2 11.4 - 15.2 seconds   INR 1.0 0.8 - 1.2    Comment: (NOTE) INR goal varies based on device and disease states. Performed at [facility], [address], [place], [place] [address]      Imaging / Studies: CT ABDOMEN PELVIS WO CONTRAST Result Date: 11/08/2023 EXAM: CT ABDOMEN AND PELVIS WITHOUT CONTRAST 11/08/2023 10:55:00 PM TECHNIQUE: CT of the abdomen and pelvis was performed without the administration of  intravenous contrast. Multiplanar reformatted images are provided for review. Automated exposure control, iterative reconstruction, and/or weight-based adjustment of the mA/kV was utilized to reduce the radiation dose to as low as reasonably achievable. COMPARISON: CT abdomen and pelvis 11/08/2023. CLINICAL HISTORY: Abdominal pain, post-op. FINDINGS: LOWER CHEST: There is atelectasis in the right lower lobe. LIVER: The liver is unremarkable. GALLBLADDER AND BILE DUCTS: Pneumobilia again seen within the gallbladder and bile ducts. Gallstones are again present. There is some new gallbladder wall thickening diffusely. SPLEEN: No acute abnormality. PANCREAS: No acute abnormality. ADRENAL GLANDS: Low-density right adrenal nodule appears unchanged measuring 16 mm, likely abnormal. Left adrenal gland is within normal limits. KIDNEYS, URETERS AND BLADDER: There is a 13 mm left renal cyst. No stones in the kidneys or ureters. No hydronephrosis. No perinephric or periureteral stranding. Urinary bladder is unremarkable. GI AND BOWEL: Stomach demonstrates no acute abnormality. Right-sided ostomy is again seen. There is some wall thickening at the level of the ostomy. There is also some wall thickening of small bowel loops in the right anterior abdomen. There is no pneumatosis. There is no bowel obstruction. PERITONEUM AND RETROPERITONEUM: Anterior right upper quadrant fluid collection containing small foci of air, abutting the abdominal wall and ostomy, measures 13 x 1.4 x 17.5 cm and has not significantly changed. There is some punctate foci of air within the thickened right anterior lower abdominal wall which appears similar to prior. There is no Saliou Barnier free intraperitoneal air. No ascites. VASCULATURE: Aorta is normal in caliber. There are atherosclerotic calcifications of the aorta and iliac arteries. LYMPH NODES: No lymphadenopathy. REPRODUCTIVE ORGANS: No acute abnormality. BONES AND SOFT TISSUES: Scoliosis and  degenerative change of the spine persists. No acute osseous abnormality. No focal soft tissue abnormality. IMPRESSION: 1. New diffuse gallbladder wall thickening with gallstones and pneumobilia. Findings are concerning for cholecystitis. 2. New Wall thickening at the level of the right-sided ostomy and small bowel loops possibly reactive enteritis . 3. Anterior right upper quadrant fluid collection containing small foci of air is concerning for abscess and has not significantly changed. Electronically signed by: Greig Pique MD 11/08/2023 11:04 PM EST RP Workstation: [id number]   CT ABDOMEN PELVIS WO CONTRAST Result Date: 11/08/2023 EXAM: CT ABDOMEN AND PELVIS WITHOUT CONTRAST  11/08/2023 12:27:23 PM TECHNIQUE: CT of the abdomen and pelvis was performed without the administration of intravenous contrast. Multiplanar reformatted images are provided for review. Automated exposure control, iterative reconstruction, and/or weight-based adjustment of the mA/kV was utilized to reduce the radiation dose to as low as reasonably achievable. COMPARISON: 10/24/2023 CLINICAL HISTORY: Abdominal pain, post-op. FINDINGS: LOWER CHEST: Mild right basilar atelectasis is noted. LIVER: The liver is unremarkable. GALLBLADDER AND BILE DUCTS: Pneumobilia is noted. No biliary ductal dilatation. SPLEEN: No acute abnormality. PANCREAS: No acute abnormality. ADRENAL GLANDS: No acute abnormality. KIDNEYS, URETERS AND BLADDER: No stones in the kidneys or ureters. No hydronephrosis. No perinephric or periureteral stranding. Urinary bladder is unremarkable. GI AND BOWEL: Colostomy noted in right lower quadrant. There is no bowel obstruction. PERITONEUM AND RETROPERITONEUM: Prostate fluid collection measuring 12x2 cm is noted in right upper quadrant concerning for possible abscess. This may extend to the colostomy site and into the right lower quadrant. No ascites. No free air. VASCULATURE: Aorta is normal in caliber. Aortic atherosclerosis.  LYMPH NODES: No lymphadenopathy. REPRODUCTIVE ORGANS: No acute abnormality. BONES AND SOFT TISSUES: Findings consistent with prior ventral hernia repair. No acute osseous abnormality. No focal soft tissue abnormality. IMPRESSION: 1. right upper quadrant fluid collection measuring 12x2 cm, concerning for abscess, with potential extension to the colostomy site and into the right lower quadrant. 2. pneumobilia and probable biliary sludge. 3. Postoperative changes consistent with prior ventral hernia repair. Electronically signed by: Lynwood Seip MD 11/08/2023 01:58 PM EST RP Workstation: [id number]     Medications / Allergies: per chart  Antibiotics: Anti-infectives (From admission, onward)    Start     Dose/Rate Route Frequency Ordered Stop   11/08/23 2200  metroNIDAZOLE  (FLAGYL ) IVPB 500 mg  Status:  Discontinued        500 mg 100 mL/hr over 60 Minutes Intravenous Every 12 hours 11/08/23 1606 11/08/23 1711   11/08/23 2200  ceFEPIme (MAXIPIME) 2 g in sodium chloride  0.9 % 100 mL IVPB  Status:  Discontinued        2 g 200 mL/hr over 30 Minutes Intravenous Every 24 hours 11/08/23 1611 11/08/23 1711   11/08/23 1800  ceFEPIme (MAXIPIME) 2 g in sodium chloride  0.9 % 100 mL IVPB        2 g 200 mL/hr over 30 Minutes Intravenous Every 24 hours 11/08/23 1711     11/08/23 1800  metroNIDAZOLE  (FLAGYL ) IVPB 500 mg        500 mg 100 mL/hr over 60 Minutes Intravenous Every 12 hours 11/08/23 1711     11/08/23 1615  metroNIDAZOLE  (FLAGYL ) IVPB 500 mg  Status:  Discontinued        500 mg 100 mL/hr over 60 Minutes Intravenous Every 12 hours 11/08/23 1606 11/08/23 1606   11/08/23 1500  piperacillin-tazobactam (ZOSYN) IVPB 3.375 g  Status:  Discontinued        3.375 g 100 mL/hr over 30 Minutes Intravenous  Once 11/08/23 1455 11/08/23 1712         Note: Portions of this report may have been transcribed using voice recognition software. Every effort was made to ensure accuracy; however, inadvertent  computerized transcription errors may be present.   Any transcriptional errors that result from this process are unintentional.    Elspeth KYM Schultze, MD, FACS, MASCRS Esophageal, Gastrointestinal & Colorectal Surgery Robotic and Minimally Invasive Surgery  [facility] [address], [address] [place], [place] [address] [phone] Fax [phone] Main  CONTACT INFORMATION: Weekday (9AM-5PM): Call [facility] main office at [phone] Weeknight (5PM-9AM) or Weekend/Holiday: Check EPIC Web Links tab & use AMION (password  [email or web address]) for General Surgery [facility] coverage  Please, DO NOT use SecureChat  (it is not reliable communication to reach operating surgeons & will lead to a delay in care).   Epic staff messaging available for outpatient concerns needing 1-2 business day response.      11/09/2023  7:30 AM

## 2023-11-09 NOTE — Plan of Care (Signed)

## 2023-11-09 NOTE — Procedures (Signed)
 Interventional Radiology Procedure Note  Procedure: CY guided aspiration of the pre-peritoneal fluid collection yields 30 mL golden serous fluid.    Complications: None  Estimated Blood Loss: None  Recommendations: - Cultures sent   Signed,  Wilkie LOIS Lent, MD

## 2023-11-10 ENCOUNTER — Other Ambulatory Visit: Payer: Self-pay

## 2023-11-10 DIAGNOSIS — N1831 Chronic kidney disease, stage 3a: Secondary | ICD-10-CM

## 2023-11-10 DIAGNOSIS — N179 Acute kidney failure, unspecified: Secondary | ICD-10-CM

## 2023-11-10 DIAGNOSIS — R188 Other ascites: Secondary | ICD-10-CM

## 2023-11-10 LAB — CBC
HCT: 21.8 % — ABNORMAL LOW (ref 36.0–46.0)
Hemoglobin: 7.4 g/dL — ABNORMAL LOW (ref 12.0–15.0)
MCH: 30 pg (ref 26.0–34.0)
MCHC: 33.9 g/dL (ref 30.0–36.0)
MCV: 88.3 fL (ref 80.0–100.0)
Platelets: 319 K/uL (ref 150–400)
RBC: 2.47 MIL/uL — ABNORMAL LOW (ref 3.87–5.11)
RDW: 14.2 % (ref 11.5–15.5)
WBC: 5.2 K/uL (ref 4.0–10.5)
nRBC: 0 % (ref 0.0–0.2)

## 2023-11-10 LAB — BASIC METABOLIC PANEL WITH GFR
Anion gap: 9 (ref 5–15)
BUN: 27 mg/dL — ABNORMAL HIGH (ref 8–23)
CO2: 28 mmol/L (ref 22–32)
Calcium: 8.9 mg/dL (ref 8.9–10.3)
Chloride: 103 mmol/L (ref 98–111)
Creatinine, Ser: 1.47 mg/dL — ABNORMAL HIGH (ref 0.44–1.00)
GFR, Estimated: 37 mL/min — ABNORMAL LOW (ref >60)
Glucose, Bld: 99 mg/dL (ref 70–99)
Potassium: 4.1 mmol/L (ref 3.5–5.1)
Sodium: 140 mmol/L (ref 135–145)

## 2023-11-10 MED ORDER — SODIUM CHLORIDE 0.9 % IV BOLUS: 1000.0000 mL | INTRAVENOUS | Status: DC

## 2023-11-10 MED ORDER — TRAMADOL HCL 50 MG PO TABS
50.0000 mg | ORAL_TABLET | Freq: Four times a day (QID) | ORAL | Status: DC | PRN
  Administered 2023-11-10 – 2023-11-12 (×6): 50 mg via ORAL
  Filled 2023-11-10 (×6): qty 1

## 2023-11-10 NOTE — Progress Notes (Signed)
 11/10/2023  Donna [name] Trim [id number] [birth year]  CARE TEAM: PCP: Clarice Nottingham, MD  Outpatient Care Team: Patient Care Team: Clarice Nottingham, MD as PCP - General (Internal Medicine) Elmira Newman PARAS, MD as PCP - Cardiology (Cardiology) Teresa Lonni HERO, MD as Consulting Physician (Colon and Rectal Surgery) Sheldon Standing, MD as Consulting Physician (General Surgery) Donnald Charleston, MD as Consulting Physician (Gastroenterology) Elmira Newman PARAS, MD as Consulting Physician (Cardiology)  Inpatient Treatment Team: Treatment Team:  Patsy Lenis, MD Ccs, Md, MD Sheldon Standing, MD Bobbette File, MD Reyes Montie LABOR, RN Arlyss Si DASEN, [place] Jerona Lauraine BROCKS, RN Eben Leas I, LCSW   Problem List:   Principal Problem:   AKI (acute kidney injury) Active Problems:   Essential hypertension   Gastroesophageal reflux disease   S/P total colectomy   Recurrent incisional hernia with incarceration   Para-ileostomy hernia (HCC)   Obstructive sleep apnea syndrome   Ileostomy present (HCC)   History of ulcerative colitis   History of malignant neoplasm of colon   Esophageal dysphagia   Abdominal wall fluid collection - probable hematoma   10/18/2023  POST-OPERATIVE DIAGNOSIS:  PARASTOMAL HERNIA AND VENTRAL INCISIONAL RECURRENT AND INCARCERATED ABDOMINAL WALL HERNIAE  Dimensions of hernia post-op:  16cm x 8cm   PROCEDURE:   ROBOTIC COMPONENT SEPARATION (transversus abdominis release = TAR) on right Repair of incarcerated recurrent incisional and parastomal hernias with mesh ROBOTIC LYSIS OF ADHESIONS SEROSAL REPAIR x2 END ILEOSTOMY REVISION TAP BLOCK - BILATERAL   SURGEON:  Standing KYM Sheldon, MD  OR FINDINGS:  Moderately dense adhesions small bowel to anterior abdominal wall and periumbilical midline hernia.  Swiss cheese hernias extending up to the subxiphoid region.  15 cm in length.  Moderate parastomal hernia around end ileostomy with a foot of small  bowel densely adherent to the hernia sac.  Serosal repairs down x 2.  Ileostomy revision done for last 7 cm given poor tissues.   Type of ventral wall repair:  Laparoscopic underlay repair .with Primary repair of largest hernia Placement of mesh: Right lateral and flank retrorectus preperitoneal.  Midline intraperitoneal. Name of mesh: Bard Ventralight dual sided (polypropylene / Seprafilm) Size of mesh: 33x27cm Orientation: Transverse Mesh overlap:  7cm    Assessment [facility] Stay = 2 days)      Improving.  Plan:  Aspiration abdominal fluid collection consistent with seroma.  Gram stain negative.  Follow-up.  Reassuring.  Gallbladder wall thickening mild with mild edema bili that is stable.  Not consistent with cholecystitis.  No need for antibiotics at this time.  Follow off of that and see how she does  I suspect she would benefit from a PICC line with IV fluids q. Monday Wednesday Friday x 6 weeks to get her further out so she does not have dehydration again.  Will get the ball rolling with this.  Asked nutrition to help evaluate encourage p.o. intake which is a challenge in this elderly woman with a ileostomy.    IV fluids.  Poor p.o. intake perhaps reflection more cholecystitis than anything else.    She is already has a fragile renal system with her chronic kidney disease.  Creatinine seems to be stabilizing and nonoliguric.  Has some history of nausea and possible gastroparesis.  Scheduled Reglan .  Perhaps a lot of this is cholecystitis.  We will see.  Pain control.  Will add some oral regimens so she is less dependent on the IV Dilaudid .  Continue IV Robaxin  for now.  Patient felt like  she was seeing things on the oxycodone  and wants to go back on the tramadol .  Will switch back   -monitor electrolytes & replace as needed.  Corrected hypokalemia.  Keep K>4, Mg>2, Phos>3  Hypertension, GERD, sleep apnea, etc. per primary service often uses oxygen at night for sleep  apnea..  -VTE prophylaxis- SCDs.  Anticoagulation prophyllaxis SQ as appropriate  -mobilize as tolerated to help recovery.  Enlist therapies in moderate/high risk patients as appropriate  I updated the patient's status to the patient  Recommendations were made.  Questions were answered.  She expressed understanding & appreciation.  -Disposition: To be determined     I reviewed nursing notes, last 24 h vitals and pain scores, last 48 h intake and output, last 24 h labs and trends, and last 24 h imaging results.  I have reviewed this patient's available data, including medical history, events of note, test results, etc as part of my evaluation.   A significant portion of that time was spent in counseling. Care during the described time interval was provided by me.  This care required moderate level of medical decision making.  11/10/2023    Subjective: (Chief complaint)  Patient feeling better.  Still sore but less.  Not nauseated.  Nursing and Dr. Patsy in room.  Objective:  Vital signs:  Vitals:   11/09/23 1233 11/09/23 2114 11/10/23 0149 11/10/23 0551  BP: (!) 123/51 (!) 123/56 (!) 123/52 (!) 114/59  Pulse: 75 76 76 76  Resp: 16 16 18 19   Temp: 97.9 F (36.6 C) 98.4 F (36.9 C) 98 F (36.7 C) 98.5 F (36.9 C)  TempSrc:  Oral Oral Oral  SpO2: 95% 99% 97% 100%  Weight:      Height:        Last BM Date : 11/10/23  Intake/Output   Yesterday:  11/08 0701 - 11/09 0700 In: 2193.3 [P.O.:330; I.V.:1763.3; IV Piggyback:100] Out: 1330 [Urine:500; Stool:830] This shift:  No intake/output data recorded.  Bowel function:  Flatus: YES  BM:  YES  Drain: (No drain)   Physical Exam:  General: Pt awake/alert in no acute distress.  Sitting up in chair.  Looks more relaxed and alert. Eyes: PERRL, normal EOM.  Sclera clear.  No icterus Neuro: CN II-XII intact w/o focal sensory/motor deficits. Lymph: No head/neck/groin lymphadenopathy Psych:  No  delerium/psychosis/paranoia.  Oriented x 4 HENT: Normocephalic, Mucus membranes moist.  No thrush Neck: Supple, No tracheal deviation.  No obvious thyromegaly Chest: No pain to chest wall compression.  Good respiratory excursion.  No audible wheezing CV:  Pulses intact.  Regular rhythm.  No major extremity edema MS: Normal AROM mjr joints.  No obvious deformity  Abdomen: Obese. Soft.  Nondistended.  Tenderness at right above ileostomy but last.  More soft.  No Murphy sign..   Ileostomy in place with no edema.  Stool and gas in ileostomy bag   Ext:   No deformity.  No mjr edema.  No cyanosis Skin: No petechiae / purpurea.  No major sores.  Warm and dry    Results:   Cultures: Recent Results (from the past 720 hours)  Aerobic/Anaerobic Culture w Gram Stain (surgical/deep wound)     Status: None (Preliminary result)   Collection Time: 11/09/23 11:31 AM   Specimen: Wound; Abdominal Fluid  Result Value Ref Range Status   Specimen Description   Final    WOUND ASCITIES Performed at [facility], [address], [place], [place] [address]    Special  Requests   Final    NONE Performed at [facility], [address] [place], [place] [address]    Gram Stain   Final    NO WBC SEEN NO ORGANISMS SEEN Performed at [facility], [address], [place], [place] [address]    Culture PENDING  Incomplete   Report Status PENDING  Incomplete    Labs: Results for orders placed or performed during the hospital encounter of 11/08/23 (from the past 48 hours)  CBC with Differential     Status: Abnormal   Collection Time: 11/08/23 11:14 AM  Result Value Ref Range   WBC 6.1 4.0 - 10.5 K/uL   RBC 2.60 (L) 3.87 - 5.11 MIL/uL   Hemoglobin 7.6 (L) 12.0 - 15.0 g/dL   HCT 77.4 (L) 63.9 - 53.9 %   MCV 86.5 80.0 - 100.0 fL   MCH 29.2 26.0 - 34.0 pg   MCHC 33.8 30.0 - 36.0 g/dL   RDW 86.2 88.4 - 84.4 %   Platelets 433 (H) 150 - 400 K/uL   nRBC 0.0 0.0 -  0.2 %   Neutrophils Relative % 74 %   Neutro Abs 4.6 1.7 - 7.7 K/uL   Lymphocytes Relative 8 %   Lymphs Abs 0.5 (L) 0.7 - 4.0 K/uL   Monocytes Relative 14 %   Monocytes Absolute 0.8 0.1 - 1.0 K/uL   Eosinophils Relative 2 %   Eosinophils Absolute 0.1 0.0 - 0.5 K/uL   Basophils Relative 1 %   Basophils Absolute 0.0 0.0 - 0.1 K/uL   Immature Granulocytes 1 %   Abs Immature Granulocytes 0.03 0.00 - 0.07 K/uL    Comment: Performed at [facility], [address] [place], [place] [address]  Comprehensive metabolic panel     Status: Abnormal   Collection Time: 11/08/23 11:14 AM  Result Value Ref Range   Sodium 133 (L) 135 - 145 mmol/L   Potassium 4.9 3.5 - 5.1 mmol/L   Chloride 96 (L) 98 - 111 mmol/L   CO2 25 22 - 32 mmol/L   Glucose, Bld 151 (H) 70 - 99 mg/dL    Comment: Glucose reference range applies only to samples taken after fasting for at least 8 hours.   BUN 63 (H) 8 - 23 mg/dL   Creatinine, Ser 6.69 (H) 0.44 - 1.00 mg/dL   Calcium 9.0 8.9 - 89.6 mg/dL   Total Protein 6.8 6.5 - 8.1 g/dL   Albumin 3.3 (L) 3.5 - 5.0 g/dL   AST 24 15 - 41 U/L   ALT 18 0 - 44 U/L   Alkaline Phosphatase 133 (H) 38 - 126 U/L   Total Bilirubin 0.5 0.0 - 1.2 mg/dL   GFR, Estimated 14 (L) >60 mL/min    Comment: (NOTE) Calculated using the CKD-EPI Creatinine Equation (2021)    Anion gap 12 5 - 15    Comment: Performed at [facility], [address], [place], [place] [address]  POC occult blood, ED     Status: None   Collection Time: 11/08/23 12:55 PM  Result Value Ref Range   Fecal Occult Bld NEGATIVE NEGATIVE  Urinalysis, Routine w reflex microscopic -Urine, Clean Catch     Status: Abnormal   Collection Time: 11/08/23  3:42 PM  Result Value Ref Range   Color, Urine YELLOW YELLOW   APPearance HAZY (A) CLEAR   Specific Gravity, Urine 1.008 1.005 - 1.030   pH 5.0 5.0 - 8.0  Glucose, UA NEGATIVE NEGATIVE mg/dL   Hgb urine dipstick NEGATIVE NEGATIVE    Bilirubin Urine NEGATIVE NEGATIVE   Ketones, ur NEGATIVE NEGATIVE mg/dL   Protein, ur NEGATIVE NEGATIVE mg/dL   Nitrite NEGATIVE NEGATIVE   Leukocytes,Ua NEGATIVE NEGATIVE    Comment: Performed at [facility], [address], [place], [place] [address]  Vitamin B12     Status: Abnormal   Collection Time: 11/08/23  4:32 PM  Result Value Ref Range   Vitamin B-12 1,740 (H) 180 - 914 pg/mL    Comment: Performed at [facility], [address] [place], [place] [address]  Folate     Status: None   Collection Time: 11/08/23  4:32 PM  Result Value Ref Range   Folate >20.0 >5.9 ng/mL    Comment: Performed at [facility], [address], [place], [place] [address]  Iron and TIBC     Status: Abnormal   Collection Time: 11/08/23  4:32 PM  Result Value Ref Range   Iron 15 (L) 28 - 170 ug/dL   TIBC 726 749 - 549 ug/dL   Saturation Ratios 5 (L) 10.4 - 31.8 %   UIBC 259 ug/dL    Comment: Performed at [facility], [address] [place], [place] [address]  Ferritin     Status: Abnormal   Collection Time: 11/08/23  4:32 PM  Result Value Ref Range   Ferritin 467 (H) 11 - 307 ng/mL    Comment: Performed at [facility], [address], [place], [place] [address]  Reticulocytes     Status: Abnormal   Collection Time: 11/08/23  4:32 PM  Result Value Ref Range   Retic Ct Pct 1.6 0.4 - 3.1 %   RBC. 2.43 (L) 3.87 - 5.11 MIL/uL   Retic Count, Absolute 37.9 19.0 - 186.0 K/uL   Immature Retic Fract 18.0 (H) 2.3 - 15.9 %    Comment: Performed at [facility], [address] [place], [place] [address]  Comprehensive metabolic panel     Status: Abnormal   Collection Time: 11/09/23  5:42 AM  Result Value Ref Range   Sodium 139 135 - 145 mmol/L   Potassium 4.3 3.5 - 5.1 mmol/L   Chloride 103 98 - 111 mmol/L   CO2 25 22 - 32 mmol/L   Glucose, Bld 104 (H) 70 - 99 mg/dL    Comment: Glucose  reference range applies only to samples taken after fasting for at least 8 hours.   BUN 47 (H) 8 - 23 mg/dL   Creatinine, Ser 7.92 (H) 0.44 - 1.00 mg/dL    Comment: Delta check noted    Calcium 9.1 8.9 - 10.3 mg/dL   Total Protein 6.2 (L) 6.5 - 8.1 g/dL   Albumin 3.0 (L) 3.5 - 5.0 g/dL   AST 25 15 - 41 U/L   ALT 15 0 - 44 U/L   Alkaline Phosphatase 116 38 - 126 U/L   Total Bilirubin 0.6 0.0 - 1.2 mg/dL   GFR, Estimated 24 (L) >60 mL/min    Comment: (NOTE) Calculated using the CKD-EPI Creatinine Equation (2021)    Anion gap 11 5 - 15    Comment: Performed at [facility], [address] [place], [place] [address]  CBC     Status: Abnormal   Collection Time: 11/09/23  5:42 AM  Result Value Ref Range   WBC 5.3 4.0 - 10.5 K/uL  RBC 2.54 (L) 3.87 - 5.11 MIL/uL   Hemoglobin 7.6 (L) 12.0 - 15.0 g/dL   HCT 77.5 (L) 63.9 - 53.9 %   MCV 88.2 80.0 - 100.0 fL   MCH 29.9 26.0 - 34.0 pg   MCHC 33.9 30.0 - 36.0 g/dL   RDW 86.1 88.4 - 84.4 %   Platelets 376 150 - 400 K/uL   nRBC 0.0 0.0 - 0.2 %    Comment: Performed at [facility], [address], [place], [place] [address]  Protime-INR     Status: None   Collection Time: 11/09/23  5:42 AM  Result Value Ref Range   Prothrombin Time 14.2 11.4 - 15.2 seconds   INR 1.0 0.8 - 1.2    Comment: (NOTE) INR goal varies based on device and disease states. Performed at [facility], [address], [place], [place] [address]   Aerobic/Anaerobic Culture w Gram Stain (surgical/deep wound)     Status: None (Preliminary result)   Collection Time: 11/09/23 11:31 AM   Specimen: Wound; Abdominal Fluid  Result Value Ref Range   Specimen Description      WOUND ASCITIES Performed at [facility], [address], [place], [place] [address]    Special Requests      NONE Performed at [facility], [address] [place], [place] [address]    Gram Stain       NO WBC SEEN NO ORGANISMS SEEN Performed at [facility], [address], [place], [place] [address]    Culture PENDING    Report Status PENDING   CBC     Status: Abnormal   Collection Time: 11/10/23  5:28 AM  Result Value Ref Range   WBC 5.2 4.0 - 10.5 K/uL   RBC 2.47 (L) 3.87 - 5.11 MIL/uL   Hemoglobin 7.4 (L) 12.0 - 15.0 g/dL   HCT 78.1 (L) 63.9 - 53.9 %   MCV 88.3 80.0 - 100.0 fL   MCH 30.0 26.0 - 34.0 pg   MCHC 33.9 30.0 - 36.0 g/dL   RDW 85.7 88.4 - 84.4 %   Platelets 319 150 - 400 K/uL   nRBC 0.0 0.0 - 0.2 %    Comment: Performed at [facility] [address], [place], [place] [address]  Basic metabolic panel with GFR     Status: Abnormal   Collection Time: 11/10/23  5:28 AM  Result Value Ref Range   Sodium 140 135 - 145 mmol/L   Potassium 4.1 3.5 - 5.1 mmol/L   Chloride 103 98 - 111 mmol/L   CO2 28 22 - 32 mmol/L   Glucose, Bld 99 70 - 99 mg/dL    Comment: Glucose reference range applies only to samples taken after fasting for at least 8 hours.   BUN 27 (H) 8 - 23 mg/dL   Creatinine, Ser 8.52 (H) 0.44 - 1.00 mg/dL   Calcium 8.9 8.9 - 89.6 mg/dL   GFR, Estimated 37 (L) >60 mL/min    Comment: (NOTE) Calculated using the CKD-EPI Creatinine Equation (2021)    Anion gap 9 5 - 15    Comment: Performed at [facility], [address] [place], [place] [address]     Imaging / Studies: CT PERC CHOLECYSTOSTOMY Result Date: 11/09/2023 INDICATION: 78 year old female a history of incarcerated abdominal wall hernia status post repair, and colostomy revision. She now has persistent abdominal pain with a preperitoneal fluid collection along the  inner surface of the right upper quadrant abdominal wall. She has also experienced a small drop in hemoglobin. She presents for aspiration and possible drain placement if the collection appears purulent. EXAM: CT-guided aspiration MEDICATIONS: The patient is currently admitted to the hospital  and receiving intravenous antibiotics. The antibiotics were administered within an appropriate time frame prior to the initiation of the procedure. ANESTHESIA/SEDATION: Moderate (conscious) sedation was employed during this procedure. A total of Versed  1 mg and Fentanyl  50 mcg was administered intravenously by the radiology nurse. Total intra-service moderate Sedation Time: 10 minutes. The patient's level of consciousness and vital signs were monitored continuously by radiology nursing throughout the procedure under my direct supervision. COMPLICATIONS: None immediate. PROCEDURE: Informed written consent was obtained from the patient after a thorough discussion of the procedural risks, benefits and alternatives. All questions were addressed. Maximal Sterile Barrier Technique was utilized including caps, mask, sterile gowns, sterile gloves, sterile drape, hand hygiene and skin antiseptic. A timeout was performed prior to the initiation of the procedure. A planning CT scan was performed. The preperitoneal fluid collection was identified. A suitable skin entry site was selected and marked. Local anesthesia was attained by infiltration with 1% lidocaine . A small dermatotomy was made. Under intermittent CT guidance, an 18 gauge trocar needle was carefully advanced into the fluid collection. Aspiration was then performed yielding approximately 30 mL of golden serous fluid. No foul smell or [place] Free purulence. Follow-up CT imaging was performed confirming that the fluid collection is largely resolved. Therefore, no drainage catheter was placed. The needle was removed. IMPRESSION: CT-guided aspiration of preperitoneal fluid collection in the right upper quadrant yields 30 mL golden serous fluid. Samples were sent for culture. Electronically Signed   By: Wilkie Lent M.D.   On: 11/09/2023 12:55   CT ABDOMEN PELVIS WO CONTRAST Result Date: 11/08/2023 EXAM: CT ABDOMEN AND PELVIS WITHOUT CONTRAST 11/08/2023 10:55:00 PM  TECHNIQUE: CT of the abdomen and pelvis was performed without the administration of intravenous contrast. Multiplanar reformatted images are provided for review. Automated exposure control, iterative reconstruction, and/or weight-based adjustment of the mA/kV was utilized to reduce the radiation dose to as low as reasonably achievable. COMPARISON: CT abdomen and pelvis 11/08/2023. CLINICAL HISTORY: Abdominal pain, post-op. FINDINGS: LOWER CHEST: There is atelectasis in the right lower lobe. LIVER: The liver is unremarkable. GALLBLADDER AND BILE DUCTS: Pneumobilia again seen within the gallbladder and bile ducts. Gallstones are again present. There is some new gallbladder wall thickening diffusely. SPLEEN: No acute abnormality. PANCREAS: No acute abnormality. ADRENAL GLANDS: Low-density right adrenal nodule appears unchanged measuring 16 mm, likely abnormal. Left adrenal gland is within normal limits. KIDNEYS, URETERS AND BLADDER: There is a 13 mm left renal cyst. No stones in the kidneys or ureters. No hydronephrosis. No perinephric or periureteral stranding. Urinary bladder is unremarkable. GI AND BOWEL: Stomach demonstrates no acute abnormality. Right-sided ostomy is again seen. There is some wall thickening at the level of the ostomy. There is also some wall thickening of small bowel loops in the right anterior abdomen. There is no pneumatosis. There is no bowel obstruction. PERITONEUM AND RETROPERITONEUM: Anterior right upper quadrant fluid collection containing small foci of air, abutting the abdominal wall and ostomy, measures 13 x 1.4 x 17.5 cm and has not significantly changed. There is some punctate foci of air within the thickened right anterior lower abdominal wall which appears similar to prior. There is no Ajani Schnieders free intraperitoneal air. No ascites. VASCULATURE: Aorta is normal in caliber. There are atherosclerotic  calcifications of the aorta and iliac arteries. LYMPH NODES: No lymphadenopathy.  REPRODUCTIVE ORGANS: No acute abnormality. BONES AND SOFT TISSUES: Scoliosis and degenerative change of the spine persists. No acute osseous abnormality. No focal soft tissue abnormality. IMPRESSION: 1. New diffuse gallbladder wall thickening with gallstones and pneumobilia. Findings are concerning for cholecystitis. 2. New Wall thickening at the level of the right-sided ostomy and small bowel loops possibly reactive enteritis . 3. Anterior right upper quadrant fluid collection containing small foci of air is concerning for abscess and has not significantly changed. Electronically signed by: Greig Pique MD 11/08/2023 11:04 PM EST RP Workstation: [id number]   CT ABDOMEN PELVIS WO CONTRAST Result Date: 11/08/2023 EXAM: CT ABDOMEN AND PELVIS WITHOUT CONTRAST 11/08/2023 12:27:23 PM TECHNIQUE: CT of the abdomen and pelvis was performed without the administration of intravenous contrast. Multiplanar reformatted images are provided for review. Automated exposure control, iterative reconstruction, and/or weight-based adjustment of the mA/kV was utilized to reduce the radiation dose to as low as reasonably achievable. COMPARISON: 10/24/2023 CLINICAL HISTORY: Abdominal pain, post-op. FINDINGS: LOWER CHEST: Mild right basilar atelectasis is noted. LIVER: The liver is unremarkable. GALLBLADDER AND BILE DUCTS: Pneumobilia is noted. No biliary ductal dilatation. SPLEEN: No acute abnormality. PANCREAS: No acute abnormality. ADRENAL GLANDS: No acute abnormality. KIDNEYS, URETERS AND BLADDER: No stones in the kidneys or ureters. No hydronephrosis. No perinephric or periureteral stranding. Urinary bladder is unremarkable. GI AND BOWEL: Colostomy noted in right lower quadrant. There is no bowel obstruction. PERITONEUM AND RETROPERITONEUM: Prostate fluid collection measuring 12x2 cm is noted in right upper quadrant concerning for possible abscess. This may extend to the colostomy site and into the right lower quadrant. No ascites.  No free air. VASCULATURE: Aorta is normal in caliber. Aortic atherosclerosis. LYMPH NODES: No lymphadenopathy. REPRODUCTIVE ORGANS: No acute abnormality. BONES AND SOFT TISSUES: Findings consistent with prior ventral hernia repair. No acute osseous abnormality. No focal soft tissue abnormality. IMPRESSION: 1. right upper quadrant fluid collection measuring 12x2 cm, concerning for abscess, with potential extension to the colostomy site and into the right lower quadrant. 2. pneumobilia and probable biliary sludge. 3. Postoperative changes consistent with prior ventral hernia repair. Electronically signed by: Lynwood Seip MD 11/08/2023 01:58 PM EST RP Workstation: [id number]     Medications / Allergies: per chart  Antibiotics: Anti-infectives (From admission, onward)    Start     Dose/Rate Route Frequency Ordered Stop   11/09/23 1400  piperacillin-tazobactam (ZOSYN) IVPB 3.375 g        3.375 g 12.5 mL/hr over 240 Minutes Intravenous Every 8 hours 11/09/23 0942 11/14/23 1359   11/08/23 2200  metroNIDAZOLE  (FLAGYL ) IVPB 500 mg  Status:  Discontinued        500 mg 100 mL/hr over 60 Minutes Intravenous Every 12 hours 11/08/23 1606 11/08/23 1711   11/08/23 2200  ceFEPIme (MAXIPIME) 2 g in sodium chloride  0.9 % 100 mL IVPB  Status:  Discontinued        2 g 200 mL/hr over 30 Minutes Intravenous Every 24 hours 11/08/23 1611 11/08/23 1711   11/08/23 1800  ceFEPIme (MAXIPIME) 2 g in sodium chloride  0.9 % 100 mL IVPB  Status:  Discontinued        2 g 200 mL/hr over 30 Minutes Intravenous Every 24 hours 11/08/23 1711 11/09/23 0942   11/08/23 1800  metroNIDAZOLE  (FLAGYL ) IVPB 500 mg  Status:  Discontinued        500 mg 100 mL/hr over 60 Minutes Intravenous Every 12 hours 11/08/23 1711 11/09/23  9057   11/08/23 1615  metroNIDAZOLE  (FLAGYL ) IVPB 500 mg  Status:  Discontinued        500 mg 100 mL/hr over 60 Minutes Intravenous Every 12 hours 11/08/23 1606 11/08/23 1606   11/08/23 1500  piperacillin-tazobactam  (ZOSYN) IVPB 3.375 g  Status:  Discontinued        3.375 g 100 mL/hr over 30 Minutes Intravenous  Once 11/08/23 1455 11/08/23 1712         Note: Portions of this report may have been transcribed using voice recognition software. Every effort was made to ensure accuracy; however, inadvertent computerized transcription errors may be present.   Any transcriptional errors that result from this process are unintentional.    Elspeth KYM Schultze, MD, FACS, MASCRS Esophageal, Gastrointestinal & Colorectal Surgery Robotic and Minimally Invasive Surgery  [facility] [address] [place], [place] [address] [phone] Fax [phone] Main  CONTACT INFORMATION: Weekday (9AM-5PM): Call [facility] main office at [phone] Weeknight (5PM-9AM) or Weekend/Holiday: Check EPIC Web Links tab & use AMION (password  [email or web address]) for General Surgery [facility] coverage  Please, DO NOT use SecureChat  (it is not reliable communication to reach operating surgeons & will lead to a delay in care).   Epic staff messaging available for outpatient concerns needing 1-2 business day response.      11/10/2023  8:39 AM

## 2023-11-10 NOTE — Hospital Course (Addendum)
 Donna [name] is a 78 year old female with history of ulcerative colitis, colon cancer status post colectomy end ileostomy, CKD stage IIIa, GERD, sleep apnea, recent hospitalization for incarcerated abdominal wall hernia underwent surgical repair.  She was sent to the emergency room with elevated creatinine on routine exam.   She was also complaining of ongoing pain right upper quadrant.  In the emergency room blood pressure is stable.  Hemoglobin 7.6, creatinine 3.3 with baseline creatinine of 1.1.  CT scan abdomen pelvis was concerning for right upper quadrant abscess and pneumobilia.  Started on antibiotics, IV fluids and pain medications and admitted to the hospital.    Assessment & Plan:   AKI on CKD stage IIIa:  - suspected multifactorial. Seems to have poor intake leading up to hospitalization  - Patient also on Lotensin  at home.  No hydronephrosis or obstruction on the CT scan.   - s/p IV fluid boluses.  - improvement in creat, down to 1.47 today - continue diet; increased to regular per surgery today  - PICC rec'd per surgery to allow for ongoing hydration outpatient   Abdominal wall fluid collection - Found to have 12 x 2 cm right upper quadrant fluid collection.  Hematoma versus abscess was initial suspicion on admission and was started on Zosyn.  Followed by surgery and IR. - Underwent aspiration with IR on 11/8 with 30 cc removed.  Culture with no growth currently - Fluid felt to be seroma per surgery and antibiotics recommended to be discontinued and monitored off   Normocytic anemia -  Baseline hemoglobin 9-10.  Presented with hemoglobin of 7.6.  Was given 1 unit of IV iron.  Will continue to monitor.  Transfuse for less than 7.   History of ulcerative colitis, colon cancer status post colectomy end ileostomy: Surgery following.   GERD: On PPI.

## 2023-11-10 NOTE — Progress Notes (Addendum)
 Progress Note    Donna [name]   FMW:[id number]  DOB: 1945/08/03  DOA: 11/08/2023     2 PCP: Donna Nottingham, MD  Initial CC: abd pain  Hospital Course: Donna [name] is a 78 year old female with history of ulcerative colitis, colon cancer status post colectomy end ileostomy, CKD stage IIIa, GERD, sleep apnea, recent hospitalization for incarcerated abdominal wall hernia underwent surgical repair.  She was sent to the emergency room with elevated creatinine on routine exam.   She was also complaining of ongoing pain right upper quadrant.  In the emergency room blood pressure is stable.  Hemoglobin 7.6, creatinine 3.3 with baseline creatinine of 1.1.  CT scan abdomen pelvis was concerning for right upper quadrant abscess and pneumobilia.  Started on antibiotics, IV fluids and pain medications and admitted to the hospital.    Assessment & Plan:   AKI on CKD stage IIIa:  - suspected multifactorial. Seems to have poor intake leading up to hospitalization  - Patient also on Lotensin  at home.  No hydronephrosis or obstruction on the CT scan.   - s/p IV fluid boluses.  - improvement in creat, down to 1.47 today - continue diet; increased to regular per surgery today  - PICC rec'd per surgery to allow for ongoing hydration outpatient   Abdominal wall fluid collection - Found to have 12 x 2 cm right upper quadrant fluid collection.  Hematoma versus abscess was initial suspicion on admission and was started on Zosyn.  Followed by surgery and IR. - Underwent aspiration with IR on 11/8 with 30 cc removed.  Culture with no growth currently - Fluid felt to be seroma per surgery and antibiotics recommended to be discontinued and monitored off   Normocytic anemia -  Baseline hemoglobin 9-10.  Presented with hemoglobin of 7.6.  Was given 1 unit of IV iron.  Will continue to monitor.  Transfuse for less than 7.   History of ulcerative colitis, colon cancer status post colectomy end ileostomy:  Surgery following.   GERD: On PPI.  Interval History:  No events overnight.  Patient comfortable this morning.  Diet further advanced and intake encouraged. Seems to be doing okay after drainage yesterday.  Antimicrobials: Zosyn 11/09/2023 >> current  DVT prophylaxis:  SCDs Start: 11/08/23 1516   Code Status:   Code Status: Full Code  Mobility Assessment (Last 72 Hours)     Mobility Assessment     Row Name 11/10/23 0841 11/10/23 0401 11/10/23 0010 11/09/23 1947 11/09/23 1300   Does the patient have exclusion criteria? No - Perform mobility assessment No - Perform mobility assessment No - Perform mobility assessment No - Perform mobility assessment No - Perform mobility assessment   What is the highest level of mobility based on the mobility assessment? Level 3 (Stands with assistance) - Balance while standing  and cannot march in place Level 3 (Stands with assistance) - Balance while standing  and cannot march in place Level 3 (Stands with assistance) - Balance while standing  and cannot march in place Level 3 (Stands with assistance) - Balance while standing  and cannot march in place Level 4 (Ambulates with assistance) - Balance while stepping forward/back - Complete   Is the above level different from baseline mobility prior to current illness? Yes - Recommend PT order Yes - Recommend PT order Yes - Recommend PT order Yes - Recommend PT order Yes - Recommend PT order    Row Name 11/08/23 2054 11/08/23 1644 11/08/23 1640  Does the patient have exclusion criteria? No - Perform mobility assessment No - Perform mobility assessment No - Perform mobility assessment     What is the highest level of mobility based on the mobility assessment? Level 4 (Ambulates with assistance) - Balance while stepping forward/back - Complete Level 4 (Ambulates with assistance) - Balance while stepping forward/back - Complete Level 4 (Ambulates with assistance) - Balance while stepping forward/back -  Complete     Is the above level different from baseline mobility prior to current illness? Yes - Recommend PT order Yes - Recommend PT order --        Diet: Diet Orders (From admission, onward)     Start     Ordered   11/10/23 0920  Diet regular Room service appropriate? Yes; Fluid consistency: Thin  Diet effective now       Question Answer Comment  Room service appropriate? Yes   Fluid consistency: Thin      11/10/23 0919            Barriers to discharge: None Disposition Plan: Home HH orders placed: N/A Status is: Inpatient  Objective: Blood pressure (!) 114/59, pulse 76, temperature 98.5 F (36.9 C), temperature source Oral, resp. rate 19, height 5' 3 (1.6 m), weight 83 kg, SpO2 100%.  Examination:  Physical Exam Constitutional:      Appearance: Normal appearance.  HENT:     Head: Normocephalic and atraumatic.     Mouth/Throat:     Mouth: Mucous membranes are moist.  Eyes:     Extraocular Movements: Extraocular movements intact.  Cardiovascular:     Rate and Rhythm: Normal rate and regular rhythm.  Pulmonary:     Effort: Pulmonary effort is normal. No respiratory distress.     Breath sounds: Normal breath sounds. No wheezing.  Abdominal:     General: Bowel sounds are normal. There is no distension.     Palpations: Abdomen is soft.     Tenderness: There is no abdominal tenderness.     Comments: Ostomy in place  Musculoskeletal:        General: Normal range of motion.     Cervical back: Normal range of motion and neck supple.  Skin:    General: Skin is warm and dry.  Neurological:     General: No focal deficit present.     Mental Status: She is alert.  Psychiatric:        Mood and Affect: Mood normal.      Consultants:  General surgery IR  Procedures:  11/09/2023: Abdominal wall fluid collection aspiration  Data Reviewed: Results for orders placed or performed during the hospital encounter of 11/08/23 (from the past 24 hours)  Aerobic/Anaerobic  Culture w Gram Stain (surgical/deep wound)     Status: None (Preliminary result)   Collection Time: 11/09/23 11:31 AM   Specimen: Wound; Abdominal Fluid  Result Value Ref Range   Specimen Description      WOUND ASCITIES Performed at [facility], [address], [place], [place] [address]    Special Requests      NONE Performed at [facility], [address], [place], [place] [address]    Gram Stain NO WBC SEEN NO ORGANISMS SEEN     Culture      NO GROWTH < 24 HOURS Performed at [facility], [address] [place], [place] [address]    Report Status PENDING   CBC     Status: Abnormal  Collection Time: 11/10/23  5:28 AM  Result Value Ref Range   WBC 5.2 4.0 - 10.5 K/uL   RBC 2.47 (L) 3.87 - 5.11 MIL/uL   Hemoglobin 7.4 (L) 12.0 - 15.0 g/dL   HCT 78.1 (L) 63.9 - 53.9 %   MCV 88.3 80.0 - 100.0 fL   MCH 30.0 26.0 - 34.0 pg   MCHC 33.9 30.0 - 36.0 g/dL   RDW 85.7 88.4 - 84.4 %   Platelets 319 150 - 400 K/uL   nRBC 0.0 0.0 - 0.2 %  Basic metabolic panel with GFR     Status: Abnormal   Collection Time: 11/10/23  5:28 AM  Result Value Ref Range   Sodium 140 135 - 145 mmol/L   Potassium 4.1 3.5 - 5.1 mmol/L   Chloride 103 98 - 111 mmol/L   CO2 28 22 - 32 mmol/L   Glucose, Bld 99 70 - 99 mg/dL   BUN 27 (H) 8 - 23 mg/dL   Creatinine, Ser 8.52 (H) 0.44 - 1.00 mg/dL   Calcium 8.9 8.9 - 89.6 mg/dL   GFR, Estimated 37 (L) >60 mL/min   Anion gap 9 5 - 15    I have reviewed pertinent nursing notes, vitals, labs, and images as necessary. I have ordered labwork to follow up on as indicated.  I have reviewed the last notes from staff over past 24 hours. I have discussed patient's care plan and test results with nursing staff, CM/SW, and other staff as appropriate.  Old records reviewed in assessment of this patient  Time spent: Greater than 50% of the 55 minute visit was spent in counseling/coordination of care for the patient as laid  out in the A&P.   LOS: 2 days   Alm Apo, MD [facility] 11/10/2023, 11:04 AM

## 2023-11-10 NOTE — Progress Notes (Signed)
   11/10/23 2200  BiPAP/CPAP/SIPAP  Reason BIPAP/CPAP not in use Other(comment) (PATIENT HAS NOT BEEN WEARING. SHE STATED SHE DOES NOT WANT TO WEAR.)

## 2023-11-10 NOTE — Plan of Care (Signed)

## 2023-11-10 NOTE — Progress Notes (Signed)
 Informed patient's RN regarding PICC placement will be tomorrow. HS [employer]

## 2023-11-11 DIAGNOSIS — N1831 Chronic kidney disease, stage 3a: Secondary | ICD-10-CM | POA: Diagnosis not present

## 2023-11-11 DIAGNOSIS — R188 Other ascites: Secondary | ICD-10-CM | POA: Diagnosis not present

## 2023-11-11 DIAGNOSIS — D649 Anemia, unspecified: Secondary | ICD-10-CM | POA: Diagnosis not present

## 2023-11-11 DIAGNOSIS — N179 Acute kidney failure, unspecified: Secondary | ICD-10-CM | POA: Diagnosis not present

## 2023-11-11 LAB — BASIC METABOLIC PANEL WITH GFR
Anion gap: 8 (ref 5–15)
BUN: 16 mg/dL (ref 8–23)
CO2: 28 mmol/L (ref 22–32)
Calcium: 8.9 mg/dL (ref 8.9–10.3)
Chloride: 105 mmol/L (ref 98–111)
Creatinine, Ser: 1.11 mg/dL — ABNORMAL HIGH (ref 0.44–1.00)
GFR, Estimated: 51 mL/min — ABNORMAL LOW (ref >60)
Glucose, Bld: 101 mg/dL — ABNORMAL HIGH (ref 70–99)
Potassium: 3.8 mmol/L (ref 3.5–5.1)
Sodium: 140 mmol/L (ref 135–145)

## 2023-11-11 LAB — CBC WITH DIFFERENTIAL/PLATELET
Abs Immature Granulocytes: 0.02 K/uL (ref 0.00–0.07)
Basophils Absolute: 0 K/uL (ref 0.0–0.1)
Basophils Relative: 1 %
Eosinophils Absolute: 0.2 K/uL (ref 0.0–0.5)
Eosinophils Relative: 4 %
HCT: 21.2 % — ABNORMAL LOW (ref 36.0–46.0)
Hemoglobin: 6.9 g/dL — CL (ref 12.0–15.0)
Immature Granulocytes: 0 %
Lymphocytes Relative: 16 %
Lymphs Abs: 0.9 K/uL (ref 0.7–4.0)
MCH: 28.9 pg (ref 26.0–34.0)
MCHC: 32.5 g/dL (ref 30.0–36.0)
MCV: 88.7 fL (ref 80.0–100.0)
Monocytes Absolute: 0.7 K/uL (ref 0.1–1.0)
Monocytes Relative: 12 %
Neutro Abs: 3.9 K/uL (ref 1.7–7.7)
Neutrophils Relative %: 67 %
Platelets: 341 K/uL (ref 150–400)
RBC: 2.39 MIL/uL — ABNORMAL LOW (ref 3.87–5.11)
RDW: 14.5 % (ref 11.5–15.5)
WBC: 5.8 K/uL (ref 4.0–10.5)
nRBC: 0 % (ref 0.0–0.2)

## 2023-11-11 LAB — MAGNESIUM: Magnesium: 1.3 mg/dL — ABNORMAL LOW (ref 1.7–2.4)

## 2023-11-11 LAB — HEMOGLOBIN AND HEMATOCRIT, BLOOD
HCT: 21.1 % — ABNORMAL LOW (ref 36.0–46.0)
Hemoglobin: 7.1 g/dL — ABNORMAL LOW (ref 12.0–15.0)

## 2023-11-11 LAB — PREPARE RBC (CROSSMATCH)

## 2023-11-11 MED ORDER — CHLORHEXIDINE GLUCONATE CLOTH 2 % EX PADS
6.0000 | MEDICATED_PAD | Freq: Every day | CUTANEOUS | Status: DC
  Administered 2023-11-11 – 2023-11-12 (×2): 6 via TOPICAL

## 2023-11-11 MED ORDER — SODIUM CHLORIDE 0.9% IV SOLUTION: Freq: Once | INTRAVENOUS | Status: AC

## 2023-11-11 MED ORDER — ALUM & MAG HYDROXIDE-SIMETH 200-200-20 MG/5ML PO SUSP
30.0000 mL | Freq: Four times a day (QID) | ORAL | Status: DC | PRN
  Administered 2023-11-11: 30 mL via ORAL
  Filled 2023-11-11: qty 30

## 2023-11-11 MED ORDER — SODIUM CHLORIDE 0.9% FLUSH: 10.0000 mL | INTRAVENOUS | Status: DC | PRN

## 2023-11-11 MED ORDER — CALCIUM CARBONATE ANTACID 500 MG PO CHEW
1.0000 | CHEWABLE_TABLET | Freq: Three times a day (TID) | ORAL | Status: DC | PRN
  Administered 2023-11-11: 200 mg via ORAL
  Filled 2023-11-11: qty 1

## 2023-11-11 MED ORDER — POTASSIUM CHLORIDE CRYS ER 20 MEQ PO TBCR
40.0000 meq | EXTENDED_RELEASE_TABLET | Freq: Once | ORAL | Status: AC
  Administered 2023-11-11: 40 meq via ORAL
  Filled 2023-11-11: qty 2

## 2023-11-11 MED ORDER — MAGNESIUM SULFATE 2 GM/50ML IV SOLN
2.0000 g | Freq: Once | INTRAVENOUS | Status: AC
  Administered 2023-11-11: 2 g via INTRAVENOUS
  Filled 2023-11-11: qty 50

## 2023-11-11 MED ORDER — TAB-A-VITE/IRON PO TABS
1.0000 | ORAL_TABLET | Freq: Every day | ORAL | Status: DC
  Administered 2023-11-11 – 2023-11-12 (×2): 1 via ORAL
  Filled 2023-11-11 (×2): qty 1

## 2023-11-11 NOTE — Progress Notes (Signed)
 11/11/2023  Donna [name] [id number] 01/28/1945  CARE TEAM: PCP: Clarice Nottingham, MD  Outpatient Care Team: Patient Care Team: Clarice Nottingham, MD as PCP - General (Internal Medicine) Elmira Newman PARAS, MD as PCP - Cardiology (Cardiology) Teresa Lonni HERO, MD as Consulting Physician (Colon and Rectal Surgery) Sheldon Standing, MD as Consulting Physician (General Surgery) Donnald Charleston, MD as Consulting Physician (Gastroenterology) Elmira Newman PARAS, MD as Consulting Physician (Cardiology)  Inpatient Treatment Team: Treatment Team:  Patsy Lenis, MD Sheldon Standing, MD Bobbette File, MD Lenon Charmaine LABOR, RN Noella Hands, RN Utomwen, [place], [facility] Estelle Hunter DEL, RN   Problem List:   Principal Problem:   Acute renal failure superimposed on stage 3a chronic kidney disease [facility]) Active Problems:   Essential hypertension   Gastroesophageal reflux disease   S/P total colectomy   Recurrent incisional hernia with incarceration   Para-ileostomy hernia (HCC)   Obstructive sleep apnea syndrome   Ileostomy present (HCC)   History of ulcerative colitis   History of malignant neoplasm of colon   Esophageal dysphagia   AKI (acute kidney injury)   Abdominal wall fluid collection - probable hematoma   10/18/2023  POST-OPERATIVE DIAGNOSIS:  PARASTOMAL HERNIA AND VENTRAL INCISIONAL RECURRENT AND INCARCERATED ABDOMINAL WALL HERNIAE  Dimensions of hernia post-op:  16cm x 8cm   PROCEDURE:   ROBOTIC COMPONENT SEPARATION (transversus abdominis release = TAR) on right Repair of incarcerated recurrent incisional and parastomal hernias with mesh ROBOTIC LYSIS OF ADHESIONS SEROSAL REPAIR x2 END ILEOSTOMY REVISION TAP BLOCK - BILATERAL   SURGEON:  Standing KYM Sheldon, MD  OR FINDINGS:  Moderately dense adhesions small bowel to anterior abdominal wall and periumbilical midline hernia.  Swiss cheese hernias extending up to the subxiphoid region.  15 cm in length.   Moderate parastomal hernia around end ileostomy with a foot of small bowel densely adherent to the hernia sac.  Serosal repairs down x 2.  Ileostomy revision done for last 7 cm given poor tissues.   Type of ventral wall repair:  Laparoscopic underlay repair .with Primary repair of largest hernia Placement of mesh: Right lateral and flank retrorectus preperitoneal.  Midline intraperitoneal. Name of mesh: Bard Ventralight dual sided (polypropylene / Seprafilm) Size of mesh: 33x27cm Orientation: Transverse Mesh overlap:  7cm    Assessment [facility] Stay = 3 days)      Improving.  Plan:  Dropping hemoglobin in the setting of iron deficiency anemia.  Already received IV iron.  Agree with blood transfusion.  Discussed with nursing of getting it done this morning.  Try and avoid it taking all day.  Hold anticoagulation.  Retry some oral iron now that ostomy is functioning and she is not nauseated.  There is no evidence of any active GI bleeding.  No hematochezia or hematemesis.  Incisions clear.  No evidence of any hematoma on aspiration of abdominal wall fluid collection.  Aspiration abdominal fluid collection consistent with seroma.  Gram stain negative.  Initial cultures negative.  Reassuring but follow   Gallbladder wall thickening mild with mild edema and pneumobilia but this is chronically stable.  Not really having much pain or Murphy sign.  Tolerating oral intake also argues against any chronic or acute cholecystitis.  No need for antibiotics at this time.  Follow off of that and see how she does.  No fevers or leukocytosis or worsening pain is reassuring  I suspect she would benefit from a PICC line with IV fluids q. Monday Wednesday Friday x 6 weeks to get her  further out so she does not have dehydration again.  Will get the ball rolling with this.  Hopefully able to do this on Monday.  Asked nutrition to help evaluate encourage p.o. intake which is a challenge in this elderly woman  with a ileostomy.    See if he can transition to IV fluid boluses in anticipation of home health plan for that for the next 6 weeks  Has some history of nausea and possible gastroparesis.  Scheduled Reglan .    Pain control.  Will add some oral regimens so she is less dependent on the IV Dilaudid .  Continue IV Robaxin  for now.  Patient felt like she was seeing things on the oxycodone  and wants to go back on the tramadol .  Will switch back   -monitor electrolytes & replace as needed.  Corrected hypokalemia.  Keep K>4, Mg>2, Phos>3  Hypertension, GERD, sleep apnea, etc. per primary service often uses oxygen at night for sleep apnea..  -VTE prophylaxis- SCDs.  Anticoagulation prophyllaxis SQ as appropriate  -mobilize as tolerated to help recovery.  Enlist therapies in moderate/high risk patients as appropriate  I updated the patient's status to the patient  Recommendations were made.  Questions were answered.  She expressed understanding & appreciation.  -Disposition: To be determined.     I reviewed nursing notes, last 24 h vitals and pain scores, last 48 h intake and output, last 24 h labs and trends, and last 24 h imaging results.  I have reviewed this patient's available data, including medical history, events of note, test results, etc as part of my evaluation.   A significant portion of that time was spent in counseling. Care during the described time interval was provided by me.  This care required moderate level of medical decision making.  11/11/2023    Subjective: (Chief complaint)  Patient feeling much better overall.  Hungary.  Not lightheaded or dizzy.  Objective:  Vital signs:  Vitals:   11/09/23 1233 11/09/23 2114 11/10/23 0149 11/10/23 0551  BP: (!) 123/51 (!) 123/56 (!) 123/52 (!) 114/59  Pulse: 75 76 76 76  Resp: 16 16 18 19   Temp: 97.9 F (36.6 C) 98.4 F (36.9 C) 98 F (36.7 C) 98.5 F (36.9 C)  TempSrc:  Oral Oral Oral  SpO2: 95% 99% 97% 100%   Weight:      Height:        Last BM Date : 11/10/23  Intake/Output   Yesterday:  11/09 0701 - 11/10 0700 In: 3666.6 [P.O.:1140; I.V.:2526.6] Out: 655 [Stool:655] This shift:  No intake/output data recorded.  Bowel function:  Flatus: YES  BM:  YES  Drain: (No drain)   Physical Exam:  General: Pt awake/alert in no acute distress.  Sitting up in chair.  Talking on phone.  Smiling.  Calm.   Eyes: PERRL, normal EOM.  Sclera clear.  No icterus Neuro: CN II-XII intact w/o focal sensory/motor deficits. Lymph: No head/neck/groin lymphadenopathy Psych:  No delerium/psychosis/paranoia.  Oriented x 4 HENT: Normocephalic, Mucus membranes moist.  No thrush Neck: Supple, No tracheal deviation.  No obvious thyromegaly Chest: No pain to chest wall compression.  Good respiratory excursion.  No audible wheezing CV:  Pulses intact.  Regular rhythm.  No major extremity edema MS: Normal AROM mjr joints.  No obvious deformity  Abdomen: Obese. Soft.  Nondistended.  Tenderness at right above ileostomy mild.  Improved.  More soft.  No Murphy sign..   Ileostomy in place with no edema.  Stool and gas  in ileostomy bag   Ext:   No deformity.  No mjr edema.  No cyanosis Skin: No petechiae / purpurea.  No major sores.  Warm and dry    Results:   Cultures: Recent Results (from the past 720 hours)  Aerobic/Anaerobic Culture w Gram Stain (surgical/deep wound)     Status: None (Preliminary result)   Collection Time: 11/09/23 11:31 AM   Specimen: Wound; Abdominal Fluid  Result Value Ref Range Status   Specimen Description   Final    WOUND ASCITIES Performed at [facility], [address], [place], [place] [address]    Special Requests   Final    NONE Performed at [facility], [address], [place], [place] [address]    Gram Stain NO WBC SEEN NO ORGANISMS SEEN   Final   Culture   Final    NO GROWTH < 24 HOURS Performed at [facility],  [address], [place], [place] [address]    Report Status PENDING  Incomplete    Labs: Results for orders placed or performed during the hospital encounter of 11/08/23 (from the past 48 hours)  Aerobic/Anaerobic Culture w Gram Stain (surgical/deep wound)     Status: None (Preliminary result)   Collection Time: 11/09/23 11:31 AM   Specimen: Wound; Abdominal Fluid  Result Value Ref Range   Specimen Description      WOUND ASCITIES Performed at [facility], [address], [place], [place] [address]    Special Requests      NONE Performed at [facility], [address] [place], [place] [address]    Gram Stain NO WBC SEEN NO ORGANISMS SEEN     Culture      NO GROWTH < 24 HOURS Performed at [facility], [address] [place], [place] [address]    Report Status PENDING   CBC     Status: Abnormal   Collection Time: 11/10/23  5:28 AM  Result Value Ref Range   WBC 5.2 4.0 - 10.5 K/uL   RBC 2.47 (L) 3.87 - 5.11 MIL/uL   Hemoglobin 7.4 (L) 12.0 - 15.0 g/dL   HCT 78.1 (L) 63.9 - 53.9 %   MCV 88.3 80.0 - 100.0 fL   MCH 30.0 26.0 - 34.0 pg   MCHC 33.9 30.0 - 36.0 g/dL   RDW 85.7 88.4 - 84.4 %   Platelets 319 150 - 400 K/uL   nRBC 0.0 0.0 - 0.2 %    Comment: Performed at [facility], [address], [place], [place] [address]  Basic metabolic panel with GFR     Status: Abnormal   Collection Time: 11/10/23  5:28 AM  Result Value Ref Range   Sodium 140 135 - 145 mmol/L   Potassium 4.1 3.5 - 5.1 mmol/L   Chloride 103 98 - 111 mmol/L   CO2 28 22 - 32 mmol/L   Glucose, Bld 99 70 - 99 mg/dL    Comment: Glucose reference range applies only to samples taken after fasting for at least 8 hours.   BUN 27 (H) 8 - 23 mg/dL   Creatinine, Ser 8.52 (H) 0.44 - 1.00 mg/dL   Calcium 8.9 8.9 - 89.6 mg/dL   GFR, Estimated 37 (L) >60 mL/min    Comment: (NOTE) Calculated using the CKD-EPI Creatinine Equation (2021)    Anion gap 9 5  - 15    Comment: Performed at [facility], [address] [place], [place] [address]  Basic metabolic panel with GFR     Status: Abnormal   Collection Time: 11/11/23  5:18 AM  Result Value Ref Range   Sodium 140 135 - 145 mmol/L   Potassium 3.8 3.5 - 5.1 mmol/L   Chloride 105 98 - 111 mmol/L   CO2 28 22 - 32 mmol/L   Glucose, Bld 101 (H) 70 - 99 mg/dL    Comment: Glucose reference range applies only to samples taken after fasting for at least 8 hours.   BUN 16 8 - 23 mg/dL   Creatinine, Ser 8.88 (H) 0.44 - 1.00 mg/dL   Calcium 8.9 8.9 - 89.6 mg/dL   GFR, Estimated 51 (L) >60 mL/min    Comment: (NOTE) Calculated using the CKD-EPI Creatinine Equation (2021)    Anion gap 8 5 - 15    Comment: Performed at [facility], [address], [place], [place] [address]  CBC with Differential/Platelet     Status: Abnormal   Collection Time: 11/11/23  5:18 AM  Result Value Ref Range   WBC 5.8 4.0 - 10.5 K/uL   RBC 2.39 (L) 3.87 - 5.11 MIL/uL   Hemoglobin 6.9 (LL) 12.0 - 15.0 g/dL    Comment: REPEATED TO VERIFY This critical result has been called to LENON MOTE RN by Venetia Deed on 11/11/2023 05:41:16, and has been read back.    HCT 21.2 (L) 36.0 - 46.0 %   MCV 88.7 80.0 - 100.0 fL   MCH 28.9 26.0 - 34.0 pg   MCHC 32.5 30.0 - 36.0 g/dL   RDW 85.4 88.4 - 84.4 %   Platelets 341 150 - 400 K/uL   nRBC 0.0 0.0 - 0.2 %   Neutrophils Relative % 67 %   Neutro Abs 3.9 1.7 - 7.7 K/uL   Lymphocytes Relative 16 %   Lymphs Abs 0.9 0.7 - 4.0 K/uL   Monocytes Relative 12 %   Monocytes Absolute 0.7 0.1 - 1.0 K/uL   Eosinophils Relative 4 %   Eosinophils Absolute 0.2 0.0 - 0.5 K/uL   Basophils Relative 1 %   Basophils Absolute 0.0 0.0 - 0.1 K/uL   Immature Granulocytes 0 %   Abs Immature Granulocytes 0.02 0.00 - 0.07 K/uL    Comment: Performed at [facility], [address], [place], [place] [address]  Magnesium      Status: Abnormal    Collection Time: 11/11/23  5:18 AM  Result Value Ref Range   Magnesium  1.3 (L) 1.7 - 2.4 mg/dL    Comment: Performed at [facility], [address] [place], [place] [address]     Imaging / Studies: US  EKG SITE RITE Result Date: 11/10/2023 If Site Rite image not attached, placement could not be confirmed due to current cardiac rhythm.  CT PERC CHOLECYSTOSTOMY Result Date: 11/09/2023 INDICATION: 78 year old female a history of incarcerated abdominal wall hernia status post repair, and colostomy revision. She now has persistent abdominal pain with a preperitoneal fluid collection along the inner surface of the right upper quadrant abdominal wall. She has also experienced a small drop in hemoglobin. She presents for aspiration and possible drain placement if the collection appears purulent. EXAM: CT-guided aspiration MEDICATIONS: The patient is currently admitted to the hospital and receiving intravenous antibiotics. The antibiotics were administered within an appropriate time frame prior to the initiation of the procedure. ANESTHESIA/SEDATION: Moderate (conscious) sedation was employed during this procedure. A total of Versed   1 mg and Fentanyl  50 mcg was administered intravenously by the radiology nurse. Total intra-service moderate Sedation Time: 10 minutes. The patient's level of consciousness and vital signs were monitored continuously by radiology nursing throughout the procedure under my direct supervision. COMPLICATIONS: None immediate. PROCEDURE: Informed written consent was obtained from the patient after a thorough discussion of the procedural risks, benefits and alternatives. All questions were addressed. Maximal Sterile Barrier Technique was utilized including caps, mask, sterile gowns, sterile gloves, sterile drape, hand hygiene and skin antiseptic. A timeout was performed prior to the initiation of the procedure. A planning CT scan was performed. The preperitoneal fluid  collection was identified. A suitable skin entry site was selected and marked. Local anesthesia was attained by infiltration with 1% lidocaine . A small dermatotomy was made. Under intermittent CT guidance, an 18 gauge trocar needle was carefully advanced into the fluid collection. Aspiration was then performed yielding approximately 30 mL of golden serous fluid. No foul smell or Belita Warsame purulence. Follow-up CT imaging was performed confirming that the fluid collection is largely resolved. Therefore, no drainage catheter was placed. The needle was removed. IMPRESSION: CT-guided aspiration of preperitoneal fluid collection in the right upper quadrant yields 30 mL golden serous fluid. Samples were sent for culture. Electronically Signed   By: Wilkie Lent M.D.   On: 11/09/2023 12:55     Medications / Allergies: per chart  Antibiotics: Anti-infectives (From admission, onward)    Start     Dose/Rate Route Frequency Ordered Stop   11/09/23 1400  piperacillin-tazobactam (ZOSYN) IVPB 3.375 g  Status:  Discontinued        3.375 g 12.5 mL/hr over 240 Minutes Intravenous Every 8 hours 11/09/23 0942 11/10/23 0925   11/08/23 2200  metroNIDAZOLE  (FLAGYL ) IVPB 500 mg  Status:  Discontinued        500 mg 100 mL/hr over 60 Minutes Intravenous Every 12 hours 11/08/23 1606 11/08/23 1711   11/08/23 2200  ceFEPIme (MAXIPIME) 2 g in sodium chloride  0.9 % 100 mL IVPB  Status:  Discontinued        2 g 200 mL/hr over 30 Minutes Intravenous Every 24 hours 11/08/23 1611 11/08/23 1711   11/08/23 1800  ceFEPIme (MAXIPIME) 2 g in sodium chloride  0.9 % 100 mL IVPB  Status:  Discontinued        2 g 200 mL/hr over 30 Minutes Intravenous Every 24 hours 11/08/23 1711 11/09/23 0942   11/08/23 1800  metroNIDAZOLE  (FLAGYL ) IVPB 500 mg  Status:  Discontinued        500 mg 100 mL/hr over 60 Minutes Intravenous Every 12 hours 11/08/23 1711 11/09/23 0942   11/08/23 1615  metroNIDAZOLE  (FLAGYL ) IVPB 500 mg  Status:  Discontinued         500 mg 100 mL/hr over 60 Minutes Intravenous Every 12 hours 11/08/23 1606 11/08/23 1606   11/08/23 1500  piperacillin-tazobactam (ZOSYN) IVPB 3.375 g  Status:  Discontinued        3.375 g 100 mL/hr over 30 Minutes Intravenous  Once 11/08/23 1455 11/08/23 1712         Note: Portions of this report may have been transcribed using voice recognition software. Every effort was made to ensure accuracy; however, inadvertent computerized transcription errors may be present.   Any transcriptional errors that result from this process are unintentional.    Elspeth KYM Schultze, MD, FACS, MASCRS Esophageal, Gastrointestinal & Colorectal Surgery Robotic and Minimally Invasive Surgery  [facility] [phone]  [address], [address] [place], [place] [address] [phone] Fax [phone] Main  CONTACT INFORMATION: Weekday (9AM-5PM): Call [facility] main office at [phone] Weeknight (5PM-9AM) or Weekend/Holiday: Check EPIC Web Links tab & use AMION (password  [email or web address]) for General Surgery [facility] coverage  Please, DO NOT use SecureChat  (it is not reliable communication to reach operating surgeons & will lead to a delay in care).   Epic staff messaging available for outpatient concerns needing 1-2 business day response.      11/11/2023  7:59 AM

## 2023-11-11 NOTE — Progress Notes (Signed)
 Date and time results received: 11/11/23 05:50am  Test: Hgb Critical Value: 6.9  Name of Provider Notified: Lynwood Kipper, NP  Orders Received? Or Actions Taken?: Orders Received - See Orders for details

## 2023-11-11 NOTE — Progress Notes (Signed)
       Overnight   NAME: Aaisha Sliter Dpwija MRN: [id number] DOB : 25-Jan-1945    Date of Service   11/11/2023   HPI/Events of Note    Notified by RN for patient not receiving the entire unit of blood. H&H obtained stat.   Latest Reference Range & Units 11/11/23 21:20  Hemoglobin 12.0 - 15.0 g/dL 7.1 (L)  HCT 63.9 - 53.9 % 21.1 (L)  (L): Data is abnormally low  Therefore, we will monitor for signs of bleeding and redraw an H&H at 6 hours (0200 hrs) and address additional at that point.  Redraw STAT prior to that time if any indication for redraw      Interventions/ Plan   Repeat H&H at 0200 Monitor for S&S of bleeding       Lynwood Kipper BSN MSNA MSN ACNPC-AG Acute Care Nurse Practitioner [facility]

## 2023-11-11 NOTE — TOC Initial Note (Signed)
 Transition of Care [facility]) - Initial/Assessment Note    Patient Details  Name: Donna [name] MRN: [id number] Date of Birth: 07/19/1945  Transition of [facility]) CM/SW Contact:    Alfonse JONELLE Rex, RN Phone Number: 11/11/2023, 3:39 PM  Clinical Narrative:   Chart Review : patient with PMH of hypertension, prior colon cancer status post colectomy with ostomy in place, recent procedure for incarcerated parastomal abdominal wall hernia complicated by SBO presenting to the emergency department with abnormal labs. Patient resides in a private residence, has family contacts on file, PCP and [employer] on file. DC plan for home iv fluids. Referral sent to [facility], rep-Pam. NCM will arrange [facility] RN for PICC line care. INPT CM will continue to follow.       Patient Goals and CMS Choice            Expected Discharge Plan and Services                                              Prior Living Arrangements/Services                       Activities of Daily Living   ADL Screening (condition at time of admission) Independently performs ADLs?: Yes (appropriate for developmental age) Is the patient deaf or have difficulty hearing?: No Does the patient have difficulty seeing, even when wearing glasses/contacts?: Yes Does the patient have difficulty concentrating, remembering, or making decisions?: No  Permission Sought/Granted                  Emotional Assessment              Admission diagnosis:  AKI (acute kidney injury) [N17.9] Post-operative wound abscess [T81.49XA] Patient Active Problem List   Diagnosis Date Noted   Acute renal failure superimposed on stage 3a chronic kidney disease (HCC) 11/10/2023   Abdominal wall fluid collection - probable hematoma 11/09/2023   AKI (acute kidney injury) 11/08/2023   Hypokalemia 10/28/2023   Pure hypercholesterolemia 10/18/2023   Prediabetes 10/18/2023   Obstructive sleep  apnea syndrome 10/18/2023   Ileostomy present (HCC) 10/18/2023   History of ulcerative colitis 10/18/2023   History of malignant neoplasm of colon 10/18/2023   Stage 3b chronic kidney disease (HCC) 10/18/2023   Sciatica 10/18/2023   Esophageal dysphagia 10/18/2023   Recurrent incisional hernia with incarceration 09/24/2023   Para-ileostomy hernia (HCC) 09/24/2023   Ulcerative colitis (HCC) 09/24/2023   Fatigue 04/26/2022   Dyspnea on exertion 03/20/2021   Elevated coronary artery calcium score 03/19/2021   S/P total colectomy 05/29/2019   Primary osteoarthritis of left knee 06/20/2018   Essential hypertension 01/21/2015   Gastroesophageal reflux disease 01/21/2015   Degeneration of lumbar or lumbosacral intervertebral disc 01/21/2015   Hyperlipidemia 01/21/2015   Bilateral carpal tunnel syndrome 01/05/2015   PCP:  Clarice Nottingham, MD Pharmacy:   [id number] ([address]), [place] - [address] [place] ([place]) [place] [address] Phone: [phone] Fax: [phone]  [facility] [address] [place] [address] Phone: [phone] Fax: [phone]     Social Drivers of Health (SDOH) Social History: SDOH Screenings   Food Insecurity: No Food Insecurity (11/08/2023)  Housing: Low Risk  (11/08/2023)  Transportation Needs: No Transportation Needs (11/08/2023)  Utilities:  Not At Risk (11/08/2023)  Depression (PHQ2-9): Low Risk  (11/03/2021)  Social Connections: Moderately Integrated (11/08/2023)  Tobacco Use: Low Risk  (11/08/2023)   SDOH Interventions:     Readmission Risk Interventions    10/22/2023    2:13 PM  Readmission Risk Prevention Plan  Transportation Screening Complete  PCP or Specialist Appt within 5-7 Days Complete  Home Care Screening Complete  Medication Review (RN CM) Complete

## 2023-11-11 NOTE — Plan of Care (Signed)
   Problem: Health Behavior/Discharge Planning: Goal: Ability to manage health-related needs will improve Outcome: Progressing

## 2023-11-11 NOTE — Progress Notes (Signed)
 Progress Note    Donna [name]   FMW:[id number]  DOB: 11-13-1945  DOA: 11/08/2023     3 PCP: Clarice Nottingham, MD  Initial CC: abd pain  Hospital Course: Ms. Ganim is a 78 year old female with history of ulcerative colitis, colon cancer status post colectomy end ileostomy, CKD stage IIIa, GERD, sleep apnea, recent hospitalization for incarcerated abdominal wall hernia underwent surgical repair.  She was sent to the emergency room with elevated creatinine on routine exam.   She was also complaining of ongoing pain right upper quadrant.  In the emergency room blood pressure is stable.  Hemoglobin 7.6, creatinine 3.3 with baseline creatinine of 1.1.  CT scan abdomen pelvis was concerning for right upper quadrant abscess and pneumobilia.  Started on antibiotics, IV fluids and pain medications and admitted to the hospital.    Assessment & Plan:   AKI on CKD stage IIIa:  - suspected multifactorial. Seems to have poor intake leading up to hospitalization  - Patient also on Lotensin  at home.  No hydronephrosis or obstruction on the CT scan.   - s/p IV fluid boluses.  - improvement in creat, down to 1.11 today - continue diet; increased to regular per surgery - PICC rec'd per surgery to allow for ongoing hydration outpatient   Abdominal wall fluid collection - Found to have 12 x 2 cm right upper quadrant fluid collection.  Hematoma versus abscess was initial suspicion on admission and was started on Zosyn.  Followed by surgery and IR. - Underwent aspiration with IR on 11/8 with 30 cc removed.  Culture with no growth currently - Fluid felt to be seroma per surgery and antibiotics recommended to be discontinued and monitored off   Normocytic anemia IDA -  Baseline hemoglobin 9-10.  Presented with hemoglobin of 7.6 -Dose of Venofer 200 mg given on 11/08/2023 - Hemoglobin down to 6.9 g/dL this morning.  1 unit PRBC ordered   History of ulcerative colitis, colon cancer status post  colectomy end ileostomy: Surgery following.   GERD: On PPI.  Interval History:  No events overnight. Hgb low this am and 1 unit PRBC ordered. Feeling okay otherwise.   Antimicrobials: Zosyn 11/09/2023 >> 11/9  DVT prophylaxis:  SCDs Start: 11/08/23 1516   Code Status:   Code Status: Full Code  Mobility Assessment (Last 72 Hours)     Mobility Assessment     Row Name 11/11/23 1100 11/10/23 2000 11/10/23 0841 11/10/23 0401 11/10/23 0010   Does the patient have exclusion criteria? No - Perform mobility assessment No - Perform mobility assessment No - Perform mobility assessment No - Perform mobility assessment No - Perform mobility assessment   What is the highest level of mobility based on the mobility assessment? Level 4 (Ambulates with assistance) - Balance while stepping forward/back - Complete Level 4 (Ambulates with assistance) - Balance while stepping forward/back - Complete Level 3 (Stands with assistance) - Balance while standing  and cannot march in place Level 3 (Stands with assistance) - Balance while standing  and cannot march in place Level 3 (Stands with assistance) - Balance while standing  and cannot march in place   Is the above level different from baseline mobility prior to current illness? Yes - Recommend PT order Yes - Recommend PT order Yes - Recommend PT order Yes - Recommend PT order Yes - Recommend PT order    Row Name 11/09/23 1947 11/09/23 1300 11/08/23 2054 11/08/23 1644 11/08/23 1640   Does the patient have exclusion criteria?  No - Perform mobility assessment No - Perform mobility assessment No - Perform mobility assessment No - Perform mobility assessment No - Perform mobility assessment   What is the highest level of mobility based on the mobility assessment? Level 3 (Stands with assistance) - Balance while standing  and cannot march in place Level 4 (Ambulates with assistance) - Balance while stepping forward/back - Complete Level 4 (Ambulates with assistance) -  Balance while stepping forward/back - Complete Level 4 (Ambulates with assistance) - Balance while stepping forward/back - Complete Level 4 (Ambulates with assistance) - Balance while stepping forward/back - Complete   Is the above level different from baseline mobility prior to current illness? Yes - Recommend PT order Yes - Recommend PT order Yes - Recommend PT order Yes - Recommend PT order --      Diet: Diet Orders (From admission, onward)     Start     Ordered   11/10/23 0920  Diet regular Room service appropriate? Yes; Fluid consistency: Thin  Diet effective now       Question Answer Comment  Room service appropriate? Yes   Fluid consistency: Thin      11/10/23 0919            Barriers to discharge: None Disposition Plan: Home HH orders placed: N/A Status is: Inpatient  Objective: Blood pressure 131/63, pulse 76, temperature 98.3 F (36.8 C), temperature source Oral, resp. rate 20, height 5' 3 (1.6 m), weight 83 kg, SpO2 99%.  Examination:  Physical Exam Constitutional:      Appearance: Normal appearance.  HENT:     Head: Normocephalic and atraumatic.     Mouth/Throat:     Mouth: Mucous membranes are moist.  Eyes:     Extraocular Movements: Extraocular movements intact.  Cardiovascular:     Rate and Rhythm: Normal rate and regular rhythm.  Pulmonary:     Effort: Pulmonary effort is normal. No respiratory distress.     Breath sounds: Normal breath sounds. No wheezing.  Abdominal:     General: Bowel sounds are normal. There is no distension.     Palpations: Abdomen is soft.     Tenderness: There is no abdominal tenderness.     Comments: Ostomy in place  Musculoskeletal:        General: Normal range of motion.     Cervical back: Normal range of motion and neck supple.  Skin:    General: Skin is warm and dry.  Neurological:     General: No focal deficit present.     Mental Status: She is alert.  Psychiatric:        Mood and Affect: Mood normal.       Consultants:  General surgery IR  Procedures:  11/09/2023: Abdominal wall fluid collection aspiration  Data Reviewed: Results for orders placed or performed during the hospital encounter of 11/08/23 (from the past 24 hours)  Basic metabolic panel with GFR     Status: Abnormal   Collection Time: 11/11/23  5:18 AM  Result Value Ref Range   Sodium 140 135 - 145 mmol/L   Potassium 3.8 3.5 - 5.1 mmol/L   Chloride 105 98 - 111 mmol/L   CO2 28 22 - 32 mmol/L   Glucose, Bld 101 (H) 70 - 99 mg/dL   BUN 16 8 - 23 mg/dL   Creatinine, Ser 8.88 (H) 0.44 - 1.00 mg/dL   Calcium 8.9 8.9 - 89.6 mg/dL   GFR, Estimated 51 (L) >60 mL/min  Anion gap 8 5 - 15  CBC with Differential/Platelet     Status: Abnormal   Collection Time: 11/11/23  5:18 AM  Result Value Ref Range   WBC 5.8 4.0 - 10.5 K/uL   RBC 2.39 (L) 3.87 - 5.11 MIL/uL   Hemoglobin 6.9 (LL) 12.0 - 15.0 g/dL   HCT 78.7 (L) 63.9 - 53.9 %   MCV 88.7 80.0 - 100.0 fL   MCH 28.9 26.0 - 34.0 pg   MCHC 32.5 30.0 - 36.0 g/dL   RDW 85.4 88.4 - 84.4 %   Platelets 341 150 - 400 K/uL   nRBC 0.0 0.0 - 0.2 %   Neutrophils Relative % 67 %   Neutro Abs 3.9 1.7 - 7.7 K/uL   Lymphocytes Relative 16 %   Lymphs Abs 0.9 0.7 - 4.0 K/uL   Monocytes Relative 12 %   Monocytes Absolute 0.7 0.1 - 1.0 K/uL   Eosinophils Relative 4 %   Eosinophils Absolute 0.2 0.0 - 0.5 K/uL   Basophils Relative 1 %   Basophils Absolute 0.0 0.0 - 0.1 K/uL   Immature Granulocytes 0 %   Abs Immature Granulocytes 0.02 0.00 - 0.07 K/uL  Magnesium      Status: Abnormal   Collection Time: 11/11/23  5:18 AM  Result Value Ref Range   Magnesium  1.3 (L) 1.7 - 2.4 mg/dL  Type and screen [facility]     Status: None   Collection Time: 11/11/23  7:49 AM  Result Value Ref Range   ABO/RH(D) A POS    Antibody Screen NEG    Sample Expiration 11/14/2023,2359    Unit Number [id number]    Blood Component Type RED CELLS,LR    Unit division 00    Status of Unit  DISCARDED    Transfusion Status OK TO TRANSFUSE    Crossmatch Result      Compatible Performed at [facility], [address], [place], [place] [address]   Prepare RBC (crossmatch)     Status: None   Collection Time: 11/11/23  7:49 AM  Result Value Ref Range   Order Confirmation      ORDER PROCESSED BY BLOOD BANK Performed at [facility], [address] [place], [place] [address]     I have reviewed pertinent nursing notes, vitals, labs, and images as necessary. I have ordered labwork to follow up on as indicated.  I have reviewed the last notes from staff over past 24 hours. I have discussed patient's care plan and test results with nursing staff, CM/SW, and other staff as appropriate.  Old records reviewed in assessment of this patient  Time spent: Greater than 50% of the 55 minute visit was spent in counseling/coordination of care for the patient as laid out in the A&P.   LOS: 3 days   Alm Apo, MD [facility] 11/11/2023, 1:32 PM

## 2023-11-11 NOTE — Progress Notes (Signed)
   11/11/23 2211  BiPAP/CPAP/SIPAP  BiPAP/CPAP/SIPAP Pt Type Adult  Reason BIPAP/CPAP not in use Non-compliant (pt refusing cpap for the night)

## 2023-11-11 NOTE — Progress Notes (Signed)
 IV infiltrated, blood was delayed due to no IV access. Blood product identification sheet returned to blood bank when blood was returned.

## 2023-11-11 NOTE — Progress Notes (Signed)
 Peripherally Inserted Central Catheter Placement  The IV Nurse has discussed with the patient and/or persons authorized to consent for the patient, the purpose of this procedure and the potential benefits and risks involved with this procedure.  The benefits include less needle sticks, lab draws from the catheter, and the patient may be discharged home with the catheter. Risks include, but not limited to, infection, bleeding, blood clot (thrombus formation), and puncture of an artery; nerve damage and irregular heartbeat and possibility to perform a PICC exchange if needed/ordered by physician.  Alternatives to this procedure were also discussed.  Bard Power PICC patient education guide, fact sheet on infection prevention and patient information card has been provided to patient /or left at bedside.    PICC Placement Documentation  PICC Single Lumen 11/11/23 Right Brachial 36 cm 0 cm (Active)  Indication for Insertion or Continuance of Line Home intravenous therapies (PICC only) 11/11/23 1417  Exposed Catheter (cm) 0 cm 11/11/23 1417  Site Assessment Clean, Dry, Intact 11/11/23 1417  Line Status Flushed;Saline locked;Blood return noted 11/11/23 1417  Dressing Type Transparent;Securing device 11/11/23 1417  Dressing Status Antimicrobial disc/dressing in place;Clean, Dry, Intact 11/11/23 1417  Line Care Connections checked and tightened 11/11/23 1417  Line Adjustment (NICU/IV Team Only) No 11/11/23 1417  Dressing Intervention New dressing;Adhesive placed at insertion site (IV team only) 11/11/23 1417  Dressing Change Due 11/18/23 11/11/23 1417       [name] 11/11/2023, 2:18 PM

## 2023-11-11 NOTE — Progress Notes (Signed)
 Patient was due to receive blood this morning. I started the blood around 9 AM, however a couple minutes later her IV infiltrated. IV team came and placed a new IV and PICC. Blood was started again in her IV however it infiltrated once again after having lots of pain and discomfort. Only about half the bag of blood was received. Patient's IV was removed and blood was discarded. NP Blondie notified of the situation.

## 2023-11-12 ENCOUNTER — Ambulatory Visit: Admitting: Family Medicine

## 2023-11-12 ENCOUNTER — Other Ambulatory Visit (HOSPITAL_COMMUNITY): Payer: Self-pay

## 2023-11-12 ENCOUNTER — Other Ambulatory Visit: Payer: Self-pay

## 2023-11-12 DIAGNOSIS — R188 Other ascites: Secondary | ICD-10-CM | POA: Diagnosis not present

## 2023-11-12 DIAGNOSIS — N179 Acute kidney failure, unspecified: Secondary | ICD-10-CM | POA: Diagnosis not present

## 2023-11-12 DIAGNOSIS — N1831 Chronic kidney disease, stage 3a: Secondary | ICD-10-CM | POA: Diagnosis not present

## 2023-11-12 LAB — CBC WITH DIFFERENTIAL/PLATELET
Abs Immature Granulocytes: 0.02 K/uL (ref 0.00–0.07)
Basophils Absolute: 0 K/uL (ref 0.0–0.1)
Basophils Relative: 1 %
Eosinophils Absolute: 0.2 K/uL (ref 0.0–0.5)
Eosinophils Relative: 4 %
HCT: 20.8 % — ABNORMAL LOW (ref 36.0–46.0)
Hemoglobin: 7 g/dL — ABNORMAL LOW (ref 12.0–15.0)
Immature Granulocytes: 0 %
Lymphocytes Relative: 15 %
Lymphs Abs: 0.9 K/uL (ref 0.7–4.0)
MCH: 30.3 pg (ref 26.0–34.0)
MCHC: 33.7 g/dL (ref 30.0–36.0)
MCV: 90 fL (ref 80.0–100.0)
Monocytes Absolute: 0.6 K/uL (ref 0.1–1.0)
Monocytes Relative: 9 %
Neutro Abs: 4.3 K/uL (ref 1.7–7.7)
Neutrophils Relative %: 71 %
Platelets: 336 K/uL (ref 150–400)
RBC: 2.31 MIL/uL — ABNORMAL LOW (ref 3.87–5.11)
RDW: 14.7 % (ref 11.5–15.5)
WBC: 6 K/uL (ref 4.0–10.5)
nRBC: 0 % (ref 0.0–0.2)

## 2023-11-12 LAB — TYPE AND SCREEN
ABO/RH(D): A POS
Antibody Screen: NEGATIVE
Unit division: 0
Unit division: 0

## 2023-11-12 LAB — BPAM RBC
Blood Product Expiration Date: 202512022359
Blood Product Expiration Date: 202512022359
ISSUE DATE / TIME: 202511101007
ISSUE DATE / TIME: 202511101533
Unit Type and Rh: 6200
Unit Type and Rh: 6200

## 2023-11-12 LAB — BASIC METABOLIC PANEL WITH GFR
Anion gap: 6 (ref 5–15)
BUN: 10 mg/dL (ref 8–23)
CO2: 29 mmol/L (ref 22–32)
Calcium: 8.9 mg/dL (ref 8.9–10.3)
Chloride: 106 mmol/L (ref 98–111)
Creatinine, Ser: 0.99 mg/dL (ref 0.44–1.00)
GFR, Estimated: 59 mL/min — ABNORMAL LOW (ref >60)
Glucose, Bld: 93 mg/dL (ref 70–99)
Potassium: 4.2 mmol/L (ref 3.5–5.1)
Sodium: 141 mmol/L (ref 135–145)

## 2023-11-12 LAB — MAGNESIUM: Magnesium: 1.5 mg/dL — ABNORMAL LOW (ref 1.7–2.4)

## 2023-11-12 MED ORDER — SODIUM CHLORIDE 0.9 % IV SOLN: INTRAVENOUS | Status: DC | PRN

## 2023-11-12 MED ORDER — HEPARIN SOD (PORK) LOCK FLUSH 100 UNIT/ML IV SOLN
250.0000 [IU] | INTRAVENOUS | Status: AC | PRN
  Administered 2023-11-12: 250 [IU]
  Filled 2023-11-12: qty 2.5

## 2023-11-12 MED ORDER — LOPERAMIDE HCL 2 MG PO CAPS
4.0000 mg | ORAL_CAPSULE | Freq: Two times a day (BID) | ORAL | Status: DC
  Administered 2023-11-12: 4 mg via ORAL
  Filled 2023-11-12: qty 2

## 2023-11-12 MED ORDER — TRAMADOL HCL 50 MG PO TABS
50.0000 mg | ORAL_TABLET | Freq: Four times a day (QID) | ORAL | 0 refills | Status: AC | PRN
  Filled 2023-11-12: qty 20, 5d supply, fill #0

## 2023-11-12 MED ORDER — MAGNESIUM SULFATE 2 GM/50ML IV SOLN
2.0000 g | Freq: Once | INTRAVENOUS | Status: AC
  Administered 2023-11-12: 2 g via INTRAVENOUS
  Filled 2023-11-12: qty 50

## 2023-11-12 MED ORDER — TAB-A-VITE/IRON PO TABS: 1.0000 | ORAL_TABLET | Freq: Every day | ORAL | Status: AC

## 2023-11-12 MED ORDER — CALCIUM POLYCARBOPHIL 625 MG PO TABS
625.0000 mg | ORAL_TABLET | Freq: Two times a day (BID) | ORAL | 0 refills | Status: AC
  Filled 2023-11-12: qty 60, 30d supply, fill #0

## 2023-11-12 NOTE — Consult Note (Addendum)
 WOC Nurse ostomy consult note  Repeated leaking; nursing staff needs advise and appliance change out Appliance was tenting with effluent leaking from left side of abd, wafer was not attached, patient will require convex wafer. Stoma type/location: LLQ Ileostomy Stomal assessment/size: 29 x 14 oblong shaped Peristomal assessment:dermatitis with denuded skin Treatment options for stomal/peristomal skin: peristomal skin breakdown; crusting method Output 75 mls Ostomy pouching: 1pc soft convex 2 1/4, barrier ring, barrier strips  Patient uses paste instead of barrier ring and may be cutting out too big and round, reviewed how to cut and skin care needs, stated she has no skin itching or bleeding around stoma, moat is noted around stoma, recommend patient use barrier ring, she applies barrier film without any powder; reviewed rationale for using crusting method.   Education provided: skin care, application of appliance, use of products, how to cut wafer.  Recommend patient drink electrolyte replacement daily and watch for signs and symptoms of imbalance and dehydration. Water  intake calculated for 182.9 lbs @ 120 ounces daily.  Enrolled patient in [employer] DC program: No, uses [employer], newspaper, established ostomy  Coordinated care with primary nurse on ostomy care needs. Patient was given extra supplies.  WOC will not follow and will remove patient from census task list. Informed patient is being discharged today pending one more consult.   Sherrilyn Hals MSN RN [facility]OCN [facility]  [phone] (Available from 7-3 pm Mon-Friday)

## 2023-11-12 NOTE — Progress Notes (Signed)
 Reviewed written d/c instructions w pt and son, all questions answered and they all verbalized understanding. D/C via w/c w all belongings in stable condition.

## 2023-11-12 NOTE — Plan of Care (Signed)

## 2023-11-12 NOTE — Discharge Instructions (Signed)
 #######################################################  Ostomy Support Information  You've heard that people get along just fine with only one of their eyes, or one of their lungs, or one of their kidneys. But you also know that you have only one intestine and only one bladder, and that leaves you feeling awfully empty, both physically and emotionally: You think no other people go around without part of their intestine with the ends of their intestines sticking out through their abdominal walls.   YOU ARE NOT ALONE.  There are nearly three quarters of a million people in the [place]  who have an ostomy; people who have had surgery to remove all or part of their colons or bladders.   There is even a national association, the [employer] with over 350 local affiliated support groups that are organized by volunteers who provide peer support and counseling. [facility] has a toll free telephone num-ber, ([phone] and an educational, interactive website, www.ostomy.org   An ostomy is an opening in the belly (abdominal wall) made by surgery. Ostomates are people who have had this procedure. The opening (stoma) allows the kidney or bowel to grdischarge waste. An external pouch covers the stoma to collect waste. Pouches are are a simple bag and are odor free. Different companies have disposable or reusable pouches to fit one's lifestyle. An ostomy can either be temporary or permanent.   THERE ARE THREE MAIN TYPES OF OSTOMIES Colostomy. A colostomy is a surgically created opening in the large intestine (colon). Ileostomy. An ileostomy is a surgically created opening in the small intestine. Urostomy. A urostomy is a surgically created opening to divert urine away from the bladder.  OSTOMY Care  The following guidelines will make care of your colostomy easier. Keep this information close by for quick reference.  Helpful DIET hints Eat a well-balanced diet including vegetables and fresh  fruits. Eat on a regular schedule.  Drink at least 6 to 8 glasses of fluids daily. Eat slowly in a relaxed atmosphere. Chew your food thoroughly. Avoid chewing gum, smoking, and drinking from a straw. This will help decrease the amount of air you swallow, which may help reduce gas. Eating yogurt or drinking buttermilk may help reduce gas.  To control gas at night, do not eat after 8 p.m. This will give your bowel time to quiet down before you go to bed.  If gas is a problem, you can purchase Beano. Sprinkle Beano on the first bite of food before eating to reduce gas. It has no flavor and should not change the taste of your food. You can buy Beano over the counter at your local drugstore.  Foods like fish, onions, garlic, broccoli, asparagus, and cabbage produce odor. Although your pouch is odor-proof, if you eat these foods you may notice a stronger odor when emptying your pouch. If this is a concern, you may want to limit these foods in your diet.  If you have an ileostomy, you will have chronic diarrhea & need to drink more liquids to avoid getting dehydrated.  Consider antidiarrheal medicine like imodium  (loperamide ) or Lomotil to help slow down bowel movements / diarrhea into your ileostomy bag.  GETTING TO GOOD BOWEL HEALTH WITH AN ILEOSTOMY    With the colon bypassed & not in use, you will have small bowel diarrhea.   It is important to thicken & slow your bowel movements down.   The goal: 4-6 small BOWEL MOVEMENTS A DAY It is important to drink plenty of liquids to avoid  getting dehydrated  CONTROLLING ILEOSTOMY DIARRHEA  TAKE A FIBER SUPPLEMENT (FiberCon or Benefiner soluble fiber) twice a day - to thicken stools by absorbing excess fluid and retrain the intestines to act more normally.  Slowly increase the dose over a few weeks.  Too much fiber too soon can backfire and cause cramping & bloating.  TAKE AN IRON SUPPLEMENT twice a day to naturally constipate your bowels.  Usually  ferrous sulfate 325mg  twice a day)  TAKE ANTI-DIARRHEAL MEDICINES: Loperamide  (Imodium ) can slow down diarrhea.  Start with two tablets (= 4mg ) first and then try one tablet every 6 hours.  Can go up to 2 pills four times day (8 pills of 2mg  max) Avoid if you are having fevers or severe pain.  If you are not better or start feeling worse, stop all medicines and call your doctor for advice LoMotil (Diphenoxylate / Atropine) is another medicine that can constipate & slow down bowel moevements Pepto Bismol (bismuth) can gently thicken bowels as well  If diarrhea is worse,: drink plenty of liquids and try simpler foods for a few days to avoid stressing your intestines further. Avoid dairy products (especially milk & ice cream) for a short time.  The intestines often can lose the ability to digest lactose when stressed. Avoid foods that cause gassiness or bloating.  Typical foods include beans and other legumes, cabbage, broccoli, and dairy foods.  Every person has some sensitivity to other foods, so listen to our body and avoid those foods that trigger problems for you.Call your doctor if you are getting worse or not better.  Sometimes further testing (cultures, endoscopy, X-ray studies, bloodwork, etc) may be needed to help diagnose and treat the cause of the diarrhea. Take extra anti-diarrheal medicines (maximum is 8 pills of 2mg  loperamide  a day)   Tips for POUCHING an OSTOMY   Changing Your Pouch The best time to change your pouch is in the morning, before eating or drinking anything. Your stoma can function at any time, but it will function more after eating or drinking.   Applying the pouching system  Place all your equipment close at hand before removing your pouch.  Wash your hands.  Stand or sit in front of a mirror. Use the position that works best for you. Remember that you must keep the skin around the stoma wrinkle-free for a good seal.  Gently remove the used pouch (1-piece  system) or the pouch and old wafer (2-piece system). Empty the pouch into the toilet. Save the closure clip to use again.  Wash the stoma itself and the skin around the stoma. Your stoma may bleed a little when being washed. This is normal. Rinse and pat dry. You may use a wash cloth or soft paper towels (like Bounty), mild soap (like Dial, Safeguard, or Ivory), and water . Avoid soaps that contain perfumes or lotions.  For a new pouch (1-piece system) or a new wafer (2-piece system), measure your stoma using the stoma guide in each box of supplies.  Trace the shape of your stoma onto the back of the new pouch or the back of the new wafer. Cut out the opening. Remove the paper backing and set it aside.  Optional: Apply a skin barrier powder to surrounding skin if it is irritated (bare or weeping), and dust off the excess. Optional: Apply a skin-prep wipe (such as Skin Prep or All-Kare) to the skin around the stoma, and let it dry. Do not apply this solution if the  skin is irritated (red, tender, or broken) or if you have shaved around the stoma. Optional: Apply a skin barrier paste (such as Stomahesive, Coloplast, or Premium) around the opening cut in the back of the pouch or wafer. Allow it to dry for 30 to 60 seconds.  Hold the pouch (1-piece system) or wafer (2-piece system) with the sticky side toward your body. Make sure the skin around the stoma is wrinkle-free. Center the opening on the stoma, then press firmly to your abdomen (Fig. 4). Look in the mirror to check if you are placing the pouch, or wafer, in the right position. For a 2-piece system, snap the pouch onto the wafer. Make sure it snaps into place securely.  Place your hand over the stoma and the pouch or wafer for about 30 seconds. The heat from your hand can help the pouch or wafer stick to your skin.  Add deodorant (such as Super Banish or Nullo) to your pouch. Other options include food extracts such as vanilla oil and peppermint  extract. Add about 10 drops of the deodorant to the pouch. Then apply the closure clamp. Note: Do not use toxic  chemicals or commercial cleaning agents in your pouch. These substances may harm the stoma.  Optional: For extra seal, apply tape to all 4 sides around the pouch or wafer, as if you were framing a picture. You may use any brand of medical adhesive tape. Change your pouch every 5 to 7 days. Change it immediately if a leak occurs.  Wash your hands afterwards.  If you are wearing a 2-piece system, you may use 2 new pouches per week and alternate them. Rinse the pouch with mild soap and warm water  and hang it to dry for the next day. Apply the fresh pouch. Alternate the 2 pouches like this for a week. After a week, change the wafer and begin with 2 new pouches. Place the old pouches in a plastic bag, and put them in the trash.   LIVING WITH AN OSTOMY  Emptying Your Pouch Empty your pouch when it is one-third full (of urine, stool, and/or gas). If you wait until your pouch is fuller than this, it will be more difficult to empty and more noticeable. When you empty your pouch, either put toilet paper in the toilet bowl first, or flush the toilet while you empty the pouch. This will reduce splashing. You can empty the pouch between your legs or to one side while sitting, or while standing or stooping. If you have a 2-piece system, you can snap off the pouch to empty it. Remember that your stoma may function during this time. If you wish to rinse your pouch after you empty it, a turkey baster can be helpful. When using a baster, squirt water  up into the pouch through the opening at the bottom. With a 2-piece system, you can snap off the pouch to rinse it. After rinsing  your pouch, empty it into the toilet. When rinsing your pouch at home, put a few granules of Dreft soap in the rinse water . This helps lubricate and freshen your pouch. The inside of your pouch can be sprayed with non-stick cooking  oil (Pam spray). This may help reduce stool sticking to the inside of the pouch.  Bathing You may shower or bathe with your pouch on or off. Remember that your stoma may function during this time.  The materials you use to wash your stoma and the skin around it should be  clean, but they do not need to be sterile.  Wearing Your Pouch During hot weather, or if you perspire a lot in general, wear a cover over your pouch. This may prevent a rash on your skin under the pouch. Pouch covers are sold at ostomy supply stores. Wear the pouch inside your underwear for better support. Watch your weight. Any gain or loss of 10 to 15 pounds or more can change the way your pouch fits.  Going Away From Home A collapsible cup (like those that come in travel kits) or a soft plastic squirt bottle with a pull-up top (like a travel bottle for shampoo) can be used for rinsing your pouch when you are away from home. Tilt the opening of the pouch at an upward angle when using a cup to rinse.  Carry wet wipes or extra tissues to use in public bathrooms.  Carry an extra pouching system with you at all times.  Never keep ostomy supplies in the glove compartment of your car. Extreme heat or cold can damage the skin barriers and adhesive wafers on the pouch.  When you travel, carry your ostomy supplies with you at all times. Keep them within easy reach. Do not pack ostomy supplies in baggage that will be checked or otherwise separated from you, because your baggage might be lost. If you're traveling out of the country, it is helpful to have a letter stating that you are carrying ostomy supplies as a medical necessity.  If you need ostomy supplies while traveling, look in the yellow pages of the telephone book under "Surgical Supplies." Or call the local ostomy organization to find out where supplies are available.  Do not let your ostomy supplies get low. Always order new pouches before you use the last one.  Reducing  Odor Limit foods such as broccoli, cabbage, onions, fish, and garlic in your diet to help reduce odor. Each time you empty your pouch, carefully clean the opening of the pouch, both inside and outside, with toilet paper. Rinse your pouch 1 or 2 times daily after you empty it (see directions for emptying your pouch and going away from home). Add deodorant (such as Super Banish or Nullo) to your pouch. Use air deodorizers in your bathroom. Do not add aspirin  to your pouch. Even though aspirin  can help prevent odor, it could cause ulcers on your stoma.  When to call the doctor Call the doctor if you have any of the following symptoms: Purple, black, or white stoma Severe cramps lasting more than 6 hours Severe watery discharge from the stoma lasting more than 6 hours No output from the colostomy for 3 days Excessive bleeding from your stoma Swelling of your stoma to more than 1/2-inch larger than usual Pulling inward of your stoma below skin level Severe skin irritation or deep ulcers Bulging or other changes in your abdomen  When to call your ostomy nurse Call your ostomy/enterostomal therapy (WOCN) nurse if any of the following occurs: Frequent leaking of your pouching system Change in size or appearance of your stoma, causing discomfort or problems with your pouch Skin rash or rawness Weight gain or loss that causes problems with your pouch     FREQUENTLY ASKED QUESTIONS   Why haven't you met any of these folks who have an ostomy?  Well, maybe you have! You just did not recognize them because an ostomy doesn't show. It can be kept secret if you wish. Why, maybe some of your best friends, office  associates or neighbors have an ostomy ... you never can tell. People facing ostomy surgery have many quality-of-life questions like: Will you bulge? Smell? Make noises? Will you feel waste leaving your body? Will you be a captive of the toilet? Will you starve? Be a social outcast? Get/stay  married? Have babies? Easily bathe, go swimming, bend over?  OK, let's look at what you can expect:   Will you bulge?  Remember, without part of the intestine or bladder, and its contents, you should have a flatter tummy than before. You can expect to wear, with little exception, what you wore before surgery ... and this in-cludes tight clothing and bathing suits.   Will you smell?  Today, thanks to modern odor proof pouching systems, you can walk into an ostomy support group meeting and not smell anything that is foul or offensive. And, for those with an ileostomy or colostomy who are concerned about odor when emptying their pouch, there are in-pouch deodorants that can be used to eliminate any waste odors that may exist.   Will you make noises?  Everyone produces gas, especially if they are an air-swallower. But intestinal sounds that occur from time to time are no differ-ent than a gurgling tummy, and quite often your clothing will muffle any sounds.   Will you feel the waste discharges?  For those with a colostomy or ileostomy there might be a slight pressure when waste leaves your body, but understand that the intestines have no nerve endings, so there will be no unpleasant sensations. Those with a urostomy will probably be unaware of any kidney drainage.   Will you be a captive of the toilet?  Immediately post-op you will spend more time in the bathroom than you will after your body recovers from surgery. Every person is different, but on average those with an ileostomy or urostomy may empty their pouches 4 to 6 times a day; a little  less if you have a colostomy. The average wear time between pouch system changes is 3 to 5 days and the changing process should take less than 30 minutes.   Will I need to be on a special diet? Most people return to their normal diet when they have recovered from surgery. Be sure to chew your food well, eat a well-balanced diet and drink plenty of fluids. If  you experience problems with a certain food, wait a couple of weeks and try it again.  Will there be odor and noises? Pouching systems are designed to be odor-proof or odor-resistant. There are deodorants that can be used in the pouch. Medications are also available to help reduce odor. Limit gas-producing foods and carbonated beverages. You will experience less gas and fewer noises as you heal from surgery.  How much time will it take to care for my ostomy? At first, you may spend a lot of time learning about your ostomy and how to take care of it. As you become more comfortable and skilled at changing the pouching system, it will take very little time to care for it.   Will I be able to return to work? People with ostomies can perform most jobs. As soon as you have healed from surgery, you should be able to return to work. Heavy lifting (more than 10 pounds) may be discouraged.   What about intimacy? Sexual relationships and intimacy are important and fulfilling aspects of your life. They should continue after ostomy surgery. Intimacy-related concerns should be discussed openly between you and  your partner.   Can I wear regular clothing? You do not need to wear special clothing. Ostomy pouches are fairly flat and barely noticeable. Elastic undergarments will not hurt the stoma or prevent the ostomy from functioning.   Can I participate in sports? An ostomy should not limit your involvement in sports. Many people with ostomies are runners, skiers, swimmers or participate in other active lifestyles. Talk with your caregiver first before doing heavy physical activity.  Will you starve?  Not if you follow doctor's orders at each stage of your post-op adjustment. There is no such thing as an "ostomy diet". Some people with an ostomy will be able to eat and tolerate anything; others may find diffi-culty with some foods. Each person is an individual and must determine, by trial, what is best for  them. A good practice for all is to drink plenty of water .   Will you be a social outcast?  Have you met anyone who has an ostomy and is a social outcast? Why should you be the first? Only your attitude and self image will effect how you are treated. No confi-dent person is an investment banker, corporate.    PROFESSIONAL HELP   Resources are available if you need help or have questions about your ostomy.   Specially trained nurses called Wound, Ostomy Continence Nurses (WOCN) are available for consultation in most major medical centers.  Consider getting an ostomy consult at an outpatient ostomy clinic.   [place] has an Ostomy Clinic run by an Chartered Certified Accountant at the [facility] campus.  [phone]. [facility] can help set up an appointment   The [employer] ([employer]) is a group made up of many local chapters throughout the [place] . These local groups hold meetings and provide support to prospective and existing ostomates. They sponsor educational events and have qualified visitors to make personal or telephone visits. Contact the UOA for the chapter nearest you and for other educational publications.  More detailed information can be found in Colostomy Guide, a publication of the [employer] (UOA). Contact UOA at 1-905-109-4028 or visit their web site at yellowspecialist.at. The website contains links to other sites, suppliers and resources.  [employer] Start Services: Start at the website to enlist for support.  Your Wound Ostomy (WOCN) nurse may have started this process. [email or web address] Secure Start services are designed to support people as they live their lives with an ostomy or neurogenic bladder. Enrolling is easy and at no cost to the patient. We realize that each person's needs and life journey are different. Through Secure Start services, we want to help people live their life, their  way.  #######################################################     Managing Pain  ######################################################################   CONTROL PAIN Control pain so that you can walk, sleep, tolerate sneezing/coughing, go up/down stairs.  (Good pain control is not pain free only when lying still, unable to move)  WALK Walk an hour a day.  Control your pain to do that.   HAVE A BOWEL MOVEMENT DAILY Keep your bowels regular to avoid problems.  OK to try a laxative to override constipation.  OK to use an antidairrheal to slow down diarrhea.  Call if not better after 2 tries  CALL IF YOU HAVE PROBLEMS/CONCERNS Call if you are still struggling despite following these instructions. Call if you have concerns not answered by these instructions  ######################################################################    Pain after surgery or related to activity is often due to strain/injury to  muscle, tendon, nerves and/or incisions.  This pain is usually short-term and will improve in a few months.   Many people find it helpful to do the following things TOGETHER to help speed the process of healing and to get back to regular activity more quickly:  Avoid heavy physical activity at first No lifting greater than 20 pounds at first, then increase to lifting as tolerated over the next few weeks Do not "push through" the pain.  Listen to your body and avoid positions and maneuvers than reproduce the pain.  Wait a few days before trying something more intense Walking is okay as tolerated, but go slowly and stop when getting sore.  If you can walk 30 minutes without stopping or pain, you can try more intense activity (running, jogging, aerobics, cycling, swimming, treadmill, sex, sports, weightlifting, etc ) Remember: If it hurts to do it, then don't do it!  Take Anti-inflammatory medication Choose ONE of the following over-the-counter medications:    Acetaminophen  500mg   tabs (Tylenol ) 1-2 pills with every meal and just before bedtime (avoid if you have liver problems) Naproxen 220mg  tabs (ex. Aleve) 1-2 pills twice a day (avoid if you have kidney, stomach, IBD, or bleeding problems) Ibuprofen  200mg  tabs (ex. Advil , Motrin ) 3-4 pills with every meal and just before bedtime (avoid if you have kidney, stomach, IBD, or bleeding problems) Take with food/snack around the clock for 1-2 weeks This helps the muscle and nerve tissues become less irritable and calm down faster  Use a Heating pad or Ice/Cold Pack 4-6 times a day May use warm bath/hottub  or showers  Try Gentle Massage and/or Stretching  at the area of pain many times a day stop if you feel pain - do not overdo it  Try these steps together to help you body heal faster and avoid making things get worse.  Doing just one of these things may not be enough.    If you are not getting better after two weeks or are noticing you are getting worse, contact our office for further advice; we may need to re-evaluate you & see what other things we can do to help.

## 2023-11-12 NOTE — Progress Notes (Signed)
 Discharge med in a secure bag delivered to patient in room - Patient verbalized an understanding that Fiber-lax was OTC. Patient to discharge after [employer] home IV teaching completed

## 2023-11-12 NOTE — Progress Notes (Addendum)
 11/12/2023  Donna [name] [id number] December 23, 1945  CARE TEAM: PCP: Clarice Nottingham, MD  Outpatient Care Team: Patient Care Team: Clarice Nottingham, MD as PCP - General (Internal Medicine) Elmira Newman PARAS, MD as PCP - Cardiology (Cardiology) Teresa Lonni HERO, MD as Consulting Physician (Colon and Rectal Surgery) Sheldon Standing, MD as Consulting Physician (General Surgery) Donnald Charleston, MD as Consulting Physician (Gastroenterology) Elmira Newman PARAS, MD as Consulting Physician (Cardiology)  Inpatient Treatment Team: Treatment Team:  Patsy Lenis, MD Sheldon Standing, MD Bobbette File, MD Sebastian Boyer, RN Purgason, Vina NOVAK, RRT Estelle Hunter DEL, RN Louann Pen, [facility] Ellie Alfonse SAUNDERS, RN   Problem List:   Principal Problem:   Acute renal failure superimposed on stage 3a chronic kidney disease [facility]) Active Problems:   Essential hypertension   Gastroesophageal reflux disease   S/P total colectomy   Recurrent incisional hernia with incarceration   Para-ileostomy hernia (HCC)   Obstructive sleep apnea syndrome   Ileostomy present (HCC)   History of ulcerative colitis   History of malignant neoplasm of colon   Esophageal dysphagia   AKI (acute kidney injury)   Abdominal wall fluid collection - probable hematoma   10/18/2023  POST-OPERATIVE DIAGNOSIS:  PARASTOMAL HERNIA AND VENTRAL INCISIONAL RECURRENT AND INCARCERATED ABDOMINAL WALL HERNIAE  Dimensions of hernia post-op:  16cm x 8cm   PROCEDURE:   ROBOTIC COMPONENT SEPARATION (transversus abdominis release = TAR) on right Repair of incarcerated recurrent incisional and parastomal hernias with mesh ROBOTIC LYSIS OF ADHESIONS SEROSAL REPAIR x2 END ILEOSTOMY REVISION TAP BLOCK - BILATERAL   SURGEON:  Standing KYM Sheldon, MD  OR FINDINGS:  Moderately dense adhesions small bowel to anterior abdominal wall and periumbilical midline hernia.  Swiss cheese hernias extending up to the subxiphoid region.   15 cm in length.  Moderate parastomal hernia around end ileostomy with a foot of small bowel densely adherent to the hernia sac.  Serosal repairs down x 2.  Ileostomy revision done for last 7 cm given poor tissues.   Type of ventral wall repair:  Laparoscopic underlay repair .with Primary repair of largest hernia Placement of mesh: Right lateral and flank retrorectus preperitoneal.  Midline intraperitoneal. Name of mesh: Bard Ventralight dual sided (polypropylene / Seprafilm) Size of mesh: 33x27cm Orientation: Transverse Mesh overlap:  7cm    Assessment [facility] Stay = 4 days)      Improving.  Plan:  Dropping hemoglobin in the setting of iron deficiency anemia.  Already received IV iron.  Blood transfusion 11/10.  Retry some oral iron now that ostomy is functioning and she is not nauseated.  There is no evidence of any active GI bleeding.  No hematochezia or hematemesis.  Incisions clear.  No evidence of any hematoma on aspiration of abdominal wall fluid collection.  Aspiration abdominal fluid collection consistent with seroma.  Gram stain negative.  Initial cultures negative.  Reassuring but follow   Eating better without nausea.  This is the best have seen her postop.  Solid diet.  Back to chronic ileostomy diarrhea.  Start adding in Imodium .  PICC line placed.  Recommend IV fluids q. Monday Wednesday Friday normal saline 1 L x 6 weeks.  Hopefully it will help minimize dehydration and readmission.  Gallbladder wall thickening mild with mild edema and pneumobilia but this is chronically stable.  Not really having much pain or Murphy sign.  Tolerating oral intake also argues against any chronic or acute cholecystitis.  No need for antibiotics at this time.  Follow off of that  and see how she does.  No fevers or leukocytosis or worsening pain is reassuring  I suspect she would benefit from a PICC line with IV fluids q. Monday Wednesday Friday x 6 weeks to get her further out so she  does not have dehydration again.  Will get the ball rolling with this.  Hopefully able to do this on Monday.  Asked nutrition to help evaluate encourage p.o. intake which is a challenge in this elderly woman with a ileostomy.    Can consider outpatient ostomy follow-up since has been some concerns of leaking some days and not others.  Make sure she does not need new pouching given the fact she had hernia repair and parastomal hernia repair.  See if he can transition to IV fluid boluses in anticipation of home health plan for that for the next 6 weeks  Has some history of nausea and possible gastroparesis.  As needed Reglan .    Pain control.  Will add some oral regimens so she is less dependent on the IV Dilaudid .  Continue IV Robaxin  for now.  Patient felt like she was seeing things on the oxycodone  and wants to go back on the tramadol .  Will switch back   -monitor electrolytes & replace as needed.  Corrected hypokalemia.  Keep K>4, Mg>2, Phos>3  Hypertension, GERD, sleep apnea, etc. per primary service often uses oxygen at night for sleep apnea..  -VTE prophylaxis- SCDs.  Anticoagulation prophyllaxis SQ as appropriate  -mobilize as tolerated to help recovery.  Enlist therapies in moderate/high risk patients as appropriate  I updated the patient's status to the patient  Recommendations were made.  Questions were answered.  She expressed understanding & appreciation.  -Disposition: I think she is safe to go from a surgery standpoint as long as medicine agrees and home health has been arranged..     I reviewed nursing notes, last 24 h vitals and pain scores, last 48 h intake and output, last 24 h labs and trends, and last 24 h imaging results.  I have reviewed this patient's available data, including medical history, events of note, test results, etc as part of my evaluation.   A significant portion of that time was spent in counseling. Care during the described time interval was provided  by me.  This care required moderate level of medical decision making.  11/12/2023    Subjective: (Chief complaint)  Patient feeling good.  Eating food without nausea.  Nursing in room.  Hoping to go home soon.  Objective:  Vital signs:  Vitals:   11/11/23 1516 11/11/23 1553 11/11/23 2141 11/12/23 0714  BP: (!) 148/87 (!) 152/67 (!) 167/75 (!) 156/79  Pulse: 75 73 77 77  Resp: 18 19 18 18   Temp: 98.7 F (37.1 C) 98.6 F (37 C) 97.9 F (36.6 C) 98.2 F (36.8 C)  TempSrc: Oral  Oral Oral  SpO2: 100%  98% 100%  Weight:      Height:        Last BM Date :  (per ostomy)  Intake/Output   Yesterday:  11/10 0701 - 11/11 0700 In: 1535.1 [P.O.:960; I.V.:303.1; Blood:272] Out: 1450 [Stool:1450] This shift:  No intake/output data recorded.  Bowel function:  Flatus: YES  BM:  YES  Drain: (No drain)   Physical Exam:  General: Pt awake/alert in no acute distress.  Sitting up in chair.  Talking on phone.  Smiling.  Calm.   Eyes: PERRL, normal EOM.  Sclera clear.  No icterus Neuro: CN  II-XII intact w/o focal sensory/motor deficits. Lymph: No head/neck/groin lymphadenopathy Psych:  No delerium/psychosis/paranoia.  Oriented x 4 HENT: Normocephalic, Mucus membranes moist.  No thrush Neck: Supple, No tracheal deviation.  No obvious thyromegaly Chest: No pain to chest wall compression.  Good respiratory excursion.  No audible wheezing CV:  Pulses intact.  Regular rhythm.  No major extremity edema MS: Normal AROM mjr joints.  No obvious deformity  Abdomen: Obese. Soft.  Nondistended.  Tenderness at right above ileostomy mild.  Improved.  More soft.  No Murphy sign..   Ileostomy in place with no edema.  Stool and gas in ileostomy bag   Ext:   No deformity.  No mjr edema.  No cyanosis Skin: No petechiae / purpurea.  No major sores.  Warm and dry    Results:   Cultures: Recent Results (from the past 720 hours)  Aerobic/Anaerobic Culture w Gram Stain (surgical/deep  wound)     Status: None (Preliminary result)   Collection Time: 11/09/23 11:31 AM   Specimen: Wound; Abdominal Fluid  Result Value Ref Range Status   Specimen Description   Final    WOUND ASCITIES Performed at [facility], [address] [place], [place] [address]    Special Requests   Final    NONE Performed at [facility], [address] [place], [place] [address]    Gram Stain NO WBC SEEN NO ORGANISMS SEEN   Final   Culture   Final    NO GROWTH 2 DAYS NO ANAEROBES ISOLATED; CULTURE IN PROGRESS FOR 5 DAYS Performed at [facility], [address] [place], [place] [address]    Report Status PENDING  Incomplete    Labs: Results for orders placed or performed during the hospital encounter of 11/08/23 (from the past 48 hours)  Basic metabolic panel with GFR     Status: Abnormal   Collection Time: 11/11/23  5:18 AM  Result Value Ref Range   Sodium 140 135 - 145 mmol/L   Potassium 3.8 3.5 - 5.1 mmol/L   Chloride 105 98 - 111 mmol/L   CO2 28 22 - 32 mmol/L   Glucose, Bld 101 (H) 70 - 99 mg/dL    Comment: Glucose reference range applies only to samples taken after fasting for at least 8 hours.   BUN 16 8 - 23 mg/dL   Creatinine, Ser 8.88 (H) 0.44 - 1.00 mg/dL   Calcium 8.9 8.9 - 89.6 mg/dL   GFR, Estimated 51 (L) >60 mL/min    Comment: (NOTE) Calculated using the CKD-EPI Creatinine Equation (2021)    Anion gap 8 5 - 15    Comment: Performed at [facility], [address], [place], [place] [address]  CBC with Differential/Platelet     Status: Abnormal   Collection Time: 11/11/23  5:18 AM  Result Value Ref Range   WBC 5.8 4.0 - 10.5 K/uL   RBC 2.39 (L) 3.87 - 5.11 MIL/uL   Hemoglobin 6.9 (LL) 12.0 - 15.0 g/dL    Comment: REPEATED TO VERIFY This critical result has been called to LENON MOTE RN by Venetia Deed on 11/11/2023 05:41:16, and has been read back.    HCT 21.2 (L) 36.0 - 46.0 %   MCV 88.7 80.0  - 100.0 fL   MCH 28.9 26.0 - 34.0 pg   MCHC 32.5 30.0 - 36.0 g/dL   RDW 85.4 88.4 - 84.4 %   Platelets 341 150 - 400 K/uL  nRBC 0.0 0.0 - 0.2 %   Neutrophils Relative % 67 %   Neutro Abs 3.9 1.7 - 7.7 K/uL   Lymphocytes Relative 16 %   Lymphs Abs 0.9 0.7 - 4.0 K/uL   Monocytes Relative 12 %   Monocytes Absolute 0.7 0.1 - 1.0 K/uL   Eosinophils Relative 4 %   Eosinophils Absolute 0.2 0.0 - 0.5 K/uL   Basophils Relative 1 %   Basophils Absolute 0.0 0.0 - 0.1 K/uL   Immature Granulocytes 0 %   Abs Immature Granulocytes 0.02 0.00 - 0.07 K/uL    Comment: Performed at [facility], [address], [place], [place] [address]  Magnesium      Status: Abnormal   Collection Time: 11/11/23  5:18 AM  Result Value Ref Range   Magnesium  1.3 (L) 1.7 - 2.4 mg/dL    Comment: Performed at [facility], [address] [place], [place] [address]  Type and screen [facility]     Status: None   Collection Time: 11/11/23  7:49 AM  Result Value Ref Range   ABO/RH(D) A POS    Antibody Screen NEG    Sample Expiration 11/14/2023,2359    Unit Number [id number]    Blood Component Type RED CELLS,LR    Unit division 00    Status of Unit DISCARDED    Transfusion Status OK TO TRANSFUSE    Crossmatch Result Compatible    Unit Number [id number]    Blood Component Type RED CELLS,LR    Unit division 00    Status of Unit ISSUED,FINAL    Transfusion Status OK TO TRANSFUSE    Crossmatch Result      Compatible Performed at [facility], [address] [place], [place] [address]   Prepare RBC (crossmatch)     Status: None   Collection Time: 11/11/23  7:49 AM  Result Value Ref Range   Order Confirmation      ORDER PROCESSED BY BLOOD BANK Performed at [facility], [address] [place], [place] [address]   Prepare RBC (crossmatch)     Status: None   Collection Time: 11/11/23  1:44 PM  Result Value Ref  Range   Order Confirmation      ORDER PROCESSED BY BLOOD BANK Performed at [facility], [address] [place], [place] [address]   Hemoglobin and hematocrit, blood     Status: Abnormal   Collection Time: 11/11/23  9:20 PM  Result Value Ref Range   Hemoglobin 7.1 (L) 12.0 - 15.0 g/dL   HCT 78.8 (L) 63.9 - 53.9 %    Comment: Performed at [facility], [address] [place], [place] [address]  CBC with Differential/Platelet     Status: Abnormal   Collection Time: 11/12/23  5:17 AM  Result Value Ref Range   WBC 6.0 4.0 - 10.5 K/uL   RBC 2.31 (L) 3.87 - 5.11 MIL/uL   Hemoglobin 7.0 (L) 12.0 - 15.0 g/dL   HCT 79.1 (L) 63.9 - 53.9 %   MCV 90.0 80.0 - 100.0 fL   MCH 30.3 26.0 - 34.0 pg   MCHC 33.7 30.0 - 36.0 g/dL   RDW 85.2 88.4 - 84.4 %   Platelets 336 150 - 400 K/uL   nRBC 0.0 0.0 - 0.2 %   Neutrophils Relative % 71 %   Neutro Abs 4.3 1.7 - 7.7 K/uL   Lymphocytes Relative 15 %   Lymphs  Abs 0.9 0.7 - 4.0 K/uL   Monocytes Relative 9 %   Monocytes Absolute 0.6 0.1 - 1.0 K/uL   Eosinophils Relative 4 %   Eosinophils Absolute 0.2 0.0 - 0.5 K/uL   Basophils Relative 1 %   Basophils Absolute 0.0 0.0 - 0.1 K/uL   Immature Granulocytes 0 %   Abs Immature Granulocytes 0.02 0.00 - 0.07 K/uL    Comment: Performed at [facility], [address] [place], [place] [address]  Magnesium      Status: Abnormal   Collection Time: 11/12/23  5:18 AM  Result Value Ref Range   Magnesium  1.5 (L) 1.7 - 2.4 mg/dL    Comment: Performed at [facility], [address] [place], [place] [address]  Basic metabolic panel with GFR     Status: Abnormal   Collection Time: 11/12/23  5:18 AM  Result Value Ref Range   Sodium 141 135 - 145 mmol/L   Potassium 4.2 3.5 - 5.1 mmol/L   Chloride 106 98 - 111 mmol/L   CO2 29 22 - 32 mmol/L   Glucose, Bld 93 70 - 99 mg/dL    Comment: Glucose reference range applies only to samples taken  after fasting for at least 8 hours.   BUN 10 8 - 23 mg/dL   Creatinine, Ser 9.00 0.44 - 1.00 mg/dL   Calcium 8.9 8.9 - 89.6 mg/dL   GFR, Estimated 59 (L) >60 mL/min    Comment: (NOTE) Calculated using the CKD-EPI Creatinine Equation (2021)    Anion gap 6 5 - 15    Comment: Performed at [facility], [name] [place], [place] [address]     Imaging / Studies: US  EKG SITE RITE Result Date: 11/10/2023 If Site Rite image not attached, placement could not be confirmed due to current cardiac rhythm.    Medications / Allergies: per chart  Antibiotics: Anti-infectives (From admission, onward)    Start     Dose/Rate Route Frequency Ordered Stop   11/09/23 1400  piperacillin-tazobactam (ZOSYN) IVPB 3.375 g  Status:  Discontinued        3.375 g 12.5 mL/hr over 240 Minutes Intravenous Every 8 hours 11/09/23 0942 11/10/23 0925   11/08/23 2200  metroNIDAZOLE  (FLAGYL ) IVPB 500 mg  Status:  Discontinued        500 mg 100 mL/hr over 60 Minutes Intravenous Every 12 hours 11/08/23 1606 11/08/23 1711   11/08/23 2200  ceFEPIme (MAXIPIME) 2 g in sodium chloride  0.9 % 100 mL IVPB  Status:  Discontinued        2 g 200 mL/hr over 30 Minutes Intravenous Every 24 hours 11/08/23 1611 11/08/23 1711   11/08/23 1800  ceFEPIme (MAXIPIME) 2 g in sodium chloride  0.9 % 100 mL IVPB  Status:  Discontinued        2 g 200 mL/hr over 30 Minutes Intravenous Every 24 hours 11/08/23 1711 11/09/23 0942   11/08/23 1800  metroNIDAZOLE  (FLAGYL ) IVPB 500 mg  Status:  Discontinued        500 mg 100 mL/hr over 60 Minutes Intravenous Every 12 hours 11/08/23 1711 11/09/23 0942   11/08/23 1615  metroNIDAZOLE  (FLAGYL ) IVPB 500 mg  Status:  Discontinued        500 mg 100 mL/hr over 60 Minutes Intravenous Every 12 hours 11/08/23 1606 11/08/23 1606   11/08/23 1500  piperacillin-tazobactam (ZOSYN) IVPB 3.375 g  Status:  Discontinued        3.375 g 100 mL/hr  over 30 Minutes Intravenous  Once 11/08/23 1455  11/08/23 1712         Note: Portions of this report may have been transcribed using voice recognition software. Every effort was made to ensure accuracy; however, inadvertent computerized transcription errors may be present.   Any transcriptional errors that result from this process are unintentional.    Elspeth KYM Schultze, MD, FACS, MASCRS Esophageal, Gastrointestinal & Colorectal Surgery Robotic and Minimally Invasive Surgery  [facility] [address] [place], [place] [address] [phone] Fax [phone] Main  CONTACT INFORMATION: Weekday (9AM-5PM): Call [facility] main office at [phone] Weeknight (5PM-9AM) or Weekend/Holiday: Check EPIC Web Links tab & use AMION (password  [email or web address]) for General Surgery [facility] coverage  Please, DO NOT use SecureChat  (it is not reliable communication to reach operating surgeons & will lead to a delay in care).   Epic staff messaging available for outpatient concerns needing 1-2 business day response.      11/12/2023  8:28 AM

## 2023-11-12 NOTE — Progress Notes (Signed)
 Nutrition Education Note  RD consulted for nutrition education regarding a diet status post ileostomy.  RD provided Ileostomy Nutrition Therapy handout from the Academy of [employer] Dietetics. Encouraged patient to keep fiber intake below 8 grams during transition from liquids to solids, and below 13 grams per day as symptoms subside. Encouraged patient to consume refined and white grain foods with less than 2g of fiber per serving as well as soft, well-cooked proteins, and well cooked vegetables without seeds or skin.  RD discussed why it is important to adhere to list of recommended foods, and foods to avoid, to maintain ostomy integrity and decrease risk of gas, diarrhea, and blockage related complications. Encouraged patient to consume 8 to 10 cups of liquid per day and to drink liquids 30 minutes after meals or snacks to prevent flushing.  Provided recipes for ORS solutions as well.  Provided tips to ensure adequate absorption of medications, vitamins, and minerals.  Expect good compliance. Son at bedside and present for education.  Body mass index is 32.42 kg/m. Pt meets criteria for obesity based on current BMI.   Current diet order is regular, patient is consuming approximately 50% of meals at this time. Labs and medications reviewed. No further nutrition interventions warranted at this time. If additional nutrition issues arise, please re-consult RD.  Morna Lee, MS, RD, LDN Inpatient Clinical Dietitian Contact via Secure chat

## 2023-11-12 NOTE — TOC Progression Note (Signed)
 Transition of Care [facility]) - Progression Note    Patient Details  Name: Donna [name] MRN: [id number] Date of Birth: 1945/08/31  Transition [facility] CM/SW Contact  Alfonse JONELLE Rex, RN Phone Number: 11/12/2023, 9:36 AM  Clinical Narrative:   HH PT/RN arranged with Hedda CHEADLE, added to AVS                        Expected Discharge Plan and Services                                               Social Drivers of Health (SDOH) Interventions SDOH Screenings   Food Insecurity: No Food Insecurity (11/08/2023)  Housing: Low Risk  (11/08/2023)  Transportation Needs: No Transportation Needs (11/08/2023)  Utilities: Not At Risk (11/08/2023)  Depression (PHQ2-9): Low Risk  (11/03/2021)  Social Connections: Moderately Integrated (11/08/2023)  Tobacco Use: Low Risk  (11/08/2023)    Readmission Risk Interventions    10/22/2023    2:13 PM  Readmission Risk Prevention Plan  Transportation Screening Complete  PCP or Specialist Appt within 5-7 Days Complete  Home Care Screening Complete  Medication Review (RN CM) Complete

## 2023-11-12 NOTE — Discharge Summary (Signed)
 Physician Discharge Summary   Jeweldean Drohan Michelli FMW:[id number] DOB: [birth year] DOA: 11/08/2023  PCP: Clarice Nottingham, MD  Admit date: 11/08/2023 Discharge date: 11/12/2023  Admitted From: Home Disposition:  Home Discharging physician: Alm Apo, MD Barriers to discharge: none  Recommendations at discharge: Repeat CBC at follow up Repeat BMP  Home Health: RN, PT  Discharge Condition: stable CODE STATUS: Full  Diet recommendation:  Diet Orders (From admission, onward)     Start     Ordered   11/12/23 0000  Diet general        11/12/23 0936   11/10/23 0920  Diet regular Room service appropriate? Yes; Fluid consistency: Thin  Diet effective now       Question Answer Comment  Room service appropriate? Yes   Fluid consistency: Thin      11/10/23 0919            Hospital Course: Ms. Reindl is a 78 year old female with history of ulcerative colitis, colon cancer status post colectomy end ileostomy, CKD stage IIIa, GERD, sleep apnea, recent hospitalization for incarcerated abdominal wall hernia underwent surgical repair.  She was sent to the emergency room with elevated creatinine on routine exam.   She was also complaining of ongoing pain right upper quadrant.  In the emergency room blood pressure is stable.  Hemoglobin 7.6, creatinine 3.3 with baseline creatinine of 1.1.  CT scan abdomen pelvis was concerning for right upper quadrant abscess and pneumobilia.  Started on antibiotics, IV fluids and pain medications and admitted to the hospital.    Assessment & Plan:   AKI on CKD stage IIIa:  - suspected multifactorial. Seems to have poor intake leading up to hospitalization  - Patient also on Lotensin  at home.  No hydronephrosis or obstruction on the CT scan.   - s/p IV fluid boluses.  - Creatinine improved with hydration - continue diet; increased to regular per surgery - PICC placed on 11/10 -Plan is for 3 times weekly normal saline infusions for about 6 weeks per  surgery; home health arranged prior to discharge   Abdominal wall fluid collection - Found to have 12 x 2 cm right upper quadrant fluid collection.  Hematoma versus abscess was initial suspicion on admission and was started on Zosyn.  Followed by surgery and IR. - Underwent aspiration with IR on 11/8 with 30 cc removed.  Culture with no growth currently - Fluid felt to be seroma per surgery and antibiotics recommended to be discontinued and monitored off   Normocytic anemia IDA -  Baseline hemoglobin 9-10.  Presented with hemoglobin of 7.6 -Dose of Venofer 200 mg given on 11/08/2023 -Unable to receive full unit of blood on 11/10.  No obvious bleeding however -Hemoglobin expected to further uptrend at discharge.  Hgb 7 g/dL at discharge - Repeat CBC at follow-up   History of ulcerative colitis, colon cancer status post colectomy end ileostomy: Surgery following.   GERD: On PPI.   The patient's acute and chronic medical conditions were treated accordingly. On day of discharge, patient was felt deemed stable for discharge. Patient/family member advised to call PCP or come back to ER if needed.   Principal Diagnosis: Acute renal failure superimposed on stage 3a chronic kidney disease [facility])  Discharge Diagnoses: Active Hospital Problems   Diagnosis Date Noted   Acute renal failure superimposed on stage 3a chronic kidney disease (HCC) 11/10/2023    Priority: 1.   Abdominal wall fluid collection - probable hematoma 11/09/2023    Priority: 2.  AKI (acute kidney injury) 11/08/2023   Obstructive sleep apnea syndrome 10/18/2023   Ileostomy present (HCC) 10/18/2023   History of malignant neoplasm of colon 10/18/2023   History of ulcerative colitis 10/18/2023   Esophageal dysphagia 10/18/2023   Recurrent incisional hernia with incarceration 09/24/2023   Para-ileostomy hernia (HCC) 09/24/2023   S/P total colectomy 05/29/2019   Gastroesophageal reflux disease 01/21/2015   Essential  hypertension 01/21/2015    Resolved Hospital Problems  No resolved problems to display.     Discharge Instructions     Diet general   Complete by: As directed    Discharge wound care:   Complete by: As directed    Continue ongoing ostomy care   Increase activity slowly   Complete by: As directed       Allergies as of 11/12/2023       Reactions   Ozempic (0.25 Or 0.5 Mg-dose) [semaglutide(0.25 Or 0.5mg -dos)] Nausea And Vomiting   Ezetimibe Other (See Comments)   Unknown   Gabapentin  Nausea And Vomiting   N/V and headaches   Iodinated Contrast Media Other (See Comments)   Feels faint, coughing, bp elevated   Mesalamine Other (See Comments)   Unknown   Oxycodone  Other (See Comments)   Hallucinations   Prilosec [omeprazole ] Other (See Comments)   Made legs hurt, hair fell out, and nauseated.    Statins Other (See Comments)   Muscle spasms        Medication List     TAKE these medications    acetaminophen  500 MG tablet Commonly known as: TYLENOL  Take 1,000 mg by mouth every 6 (six) hours as needed for mild pain or headache.   amLODipine  10 MG tablet Commonly known as: NORVASC  Take 10 mg by mouth at bedtime.   aspirin  EC 81 MG tablet Take 1 tablet (81 mg total) by mouth daily. Swallow whole.   B COMPLEX 100 PO Take 1 tablet by mouth daily.   benazepril  40 MG tablet Commonly known as: LOTENSIN  Take 40 mg by mouth at bedtime.   calcium carbonate 1250 (500 Ca) MG tablet Commonly known as: OS-CAL - dosed in mg of elemental calcium Take 2 tablets by mouth 2 (two) times daily with a meal.   cyclobenzaprine 5 MG tablet Commonly known as: FLEXERIL Take 1 tablet (5 mg total) by mouth 3 (three) times daily as needed for muscle spasms.   famotidine 20 MG tablet Commonly known as: PEPCID Take 20 mg by mouth daily.   hydrochlorothiazide  25 MG tablet Commonly known as: HYDRODIURIL  Take 25 mg by mouth daily.   labetalol  100 MG tablet Commonly known as:  NORMODYNE  TAKE 1 TABLET TWICE DAILY (NEED TO KEEP UPCOMING APPOINTMENT IN APRIL TO PREVENT ANY MISSED DOSES) What changed: See the new instructions.   latanoprost  0.005 % ophthalmic solution Commonly known as: XALATAN  Place 1 drop into both eyes at bedtime.   loperamide  2 MG capsule Commonly known as: IMODIUM  Take 1 capsule (2 mg total) by mouth 2 (two) times daily. What changed:  how much to take when to take this   metoCLOPramide  5 MG tablet Commonly known as: REGLAN  Take 1 tablet (5 mg total) by mouth every 8 (eight) hours as needed for nausea or vomiting.   multivitamin with minerals Tabs tablet Take 1 tablet by mouth daily. Woman's one a day   multivitamins with iron Tabs tablet Take 1 tablet by mouth daily. Start taking on: November 13, 2023   Muscle Rub 10-15 % Crea Apply 1 application  topically 2 (two) times daily as needed for muscle pain.   pantoprazole  40 MG tablet Commonly known as: PROTONIX  Take 40 mg by mouth 2 (two) times daily before a meal.   polycarbophil 625 MG tablet Commonly known as: FIBERCON Take 1 tablet (625 mg total) by mouth 2 (two) times daily. Notes to patient: Will need to purchase over the counter   psyllium 95 % Pack Commonly known as: HYDROCIL/METAMUCIL Take 1 packet by mouth daily.   Repatha  SureClick 140 MG/ML Soaj Generic drug: Evolocumab  Inject 140 mg (1 pen) into the skin every 14 days   timolol 0.5 % ophthalmic solution Commonly known as: TIMOPTIC Place 1 drop into both eyes every morning.   traMADol  50 MG tablet Commonly known as: ULTRAM  Take 1 tablet (50 mg total) by mouth every 6 (six) hours as needed for moderate pain (pain score 4-6) or severe pain (pain score 7-10).   traZODone 50 MG tablet Commonly known as: DESYREL Take 50 mg by mouth at bedtime.               Discharge Care Instructions  (From admission, onward)           Start     Ordered   11/12/23 0000  Discharge wound care:       Comments:  Continue ongoing ostomy care   11/12/23 0936            Follow-up Information     Sheldon Standing, MD Follow up in 1 month(s).   Specialties: General Surgery, Colon and Rectal Surgery Why: To follow up after your hospital stay, To follow up after your operation Contact information: [address] [place] [address] [phone]         Care, [facility] Follow up.   Specialty: Home Health Services Why: Home Health   Physical Therapy  Skilled Nursing ( RN) Contact information: [address] [place] [address] [phone]                Allergies  Allergen Reactions   Ozempic (0.25 Or 0.5 Mg-Dose) [Semaglutide(0.25 Or 0.5mg -Dos)] Nausea And Vomiting   Ezetimibe Other (See Comments)    Unknown   Gabapentin  Nausea And Vomiting    N/V and headaches   Iodinated Contrast Media Other (See Comments)    Feels faint, coughing, bp elevated   Mesalamine Other (See Comments)    Unknown   Oxycodone  Other (See Comments)    Hallucinations   Prilosec [Omeprazole ] Other (See Comments)    Made legs hurt, hair fell out, and nauseated.    Statins Other (See Comments)    Muscle spasms     Consultations: General surgery  Procedures:   Discharge Exam: BP (!) 156/79 (BP Location: Left Arm)   Pulse 77   Temp 98.2 F (36.8 C) (Oral)   Resp 18   Ht 5' 3 (1.6 m)   Wt 83 kg   SpO2 100%   BMI 32.42 kg/m  Physical Exam Constitutional:      Appearance: Normal appearance.  HENT:     Head: Normocephalic and atraumatic.     Mouth/Throat:     Mouth: Mucous membranes are moist.  Eyes:     Extraocular Movements: Extraocular movements intact.  Cardiovascular:     Rate and Rhythm: Normal rate and regular rhythm.  Pulmonary:     Effort: Pulmonary effort is normal. No respiratory distress.     Breath sounds: Normal breath sounds. No wheezing.  Abdominal:     General: Bowel sounds are normal. There is no distension.     Palpations:  Abdomen is soft.     Tenderness: There is no abdominal tenderness.     Comments: Ostomy in place  Musculoskeletal:        General: Normal range of motion.     Cervical back: Normal range of motion and neck supple.  Skin:    General: Skin is warm and dry.  Neurological:     General: No focal deficit present.     Mental Status: She is alert.  Psychiatric:        Mood and Affect: Mood normal.      The results of significant diagnostics from this hospitalization (including imaging, microbiology, ancillary and laboratory) are listed below for reference.   Microbiology: Recent Results (from the past 240 hours)  Aerobic/Anaerobic Culture w Gram Stain (surgical/deep wound)     Status: None (Preliminary result)   Collection Time: 11/09/23 11:31 AM   Specimen: Wound; Abdominal Fluid  Result Value Ref Range Status   Specimen Description   Final    WOUND ASCITIES Performed at [facility], [address], [place], [place] [address]    Special Requests   Final    NONE Performed at [facility], [address] [place], [place] [address]    Gram Stain NO WBC SEEN NO ORGANISMS SEEN   Final   Culture   Final    NO GROWTH 3 DAYS NO ANAEROBES ISOLATED; CULTURE IN PROGRESS FOR 5 DAYS Performed at [facility], [address] [place], [place] [address]    Report Status PENDING  Incomplete     Labs: BNP (last 3 results) No results for input(s): BNP in the last 8760 hours. Basic Metabolic Panel: Recent Labs  Lab 11/08/23 1114 11/09/23 0542 11/10/23 0528 11/11/23 0518 11/12/23 0518  NA 133* 139 140 140 141  K 4.9 4.3 4.1 3.8 4.2  CL 96* 103 103 105 106  CO2 25 25 28 28 29   GLUCOSE 151* 104* 99 101* 93  BUN 63* 47* 27* 16 10  CREATININE 3.30* 2.07* 1.47* 1.11* 0.99  CALCIUM 9.0 9.1 8.9 8.9 8.9  MG  --   --   --  1.3* 1.5*   Liver Function Tests: Recent Labs  Lab 11/08/23 1114 11/09/23 0542  AST 24 25  ALT 18 15  ALKPHOS 133*  116  BILITOT 0.5 0.6  PROT 6.8 6.2*  ALBUMIN 3.3* 3.0*   No results for input(s): LIPASE, AMYLASE in the last 168 hours. No results for input(s): AMMONIA in the last 168 hours. CBC: Recent Labs  Lab 11/08/23 1114 11/09/23 0542 11/10/23 0528 11/11/23 0518 11/11/23 2120 11/12/23 0517  WBC 6.1 5.3 5.2 5.8  --  6.0  NEUTROABS 4.6  --   --  3.9  --  4.3  HGB 7.6* 7.6* 7.4* 6.9* 7.1* 7.0*  HCT 22.5* 22.4* 21.8* 21.2* 21.1* 20.8*  MCV 86.5 88.2 88.3 88.7  --  90.0  PLT 433* 376 319 341  --  336   Cardiac Enzymes: No results for input(s): CKTOTAL, CKMB, CKMBINDEX, TROPONINI in the last 168 hours. BNP: Invalid input(s): POCBNP CBG: No results for input(s): GLUCAP in the last 168 hours. D-Dimer No results for input(s): DDIMER in the last 72 hours. Hgb A1c No results for input(s): HGBA1C in the last 72 hours. Lipid Profile No results for input(s): CHOL, HDL, LDLCALC, TRIG, CHOLHDL, LDLDIRECT in  the last 72 hours. Thyroid  function studies No results for input(s): TSH, T4TOTAL, T3FREE, THYROIDAB in the last 72 hours.  Invalid input(s): FREET3 Anemia work up No results for input(s): VITAMINB12, FOLATE, FERRITIN, TIBC, IRON, RETICCTPCT in the last 72 hours. Urinalysis    Component Value Date/Time   COLORURINE YELLOW 11/08/2023 1542   APPEARANCEUR HAZY (A) 11/08/2023 1542   LABSPEC 1.008 11/08/2023 1542   PHURINE 5.0 11/08/2023 1542   GLUCOSEU NEGATIVE 11/08/2023 1542   HGBUR NEGATIVE 11/08/2023 1542   BILIRUBINUR NEGATIVE 11/08/2023 1542   KETONESUR NEGATIVE 11/08/2023 1542   PROTEINUR NEGATIVE 11/08/2023 1542   UROBILINOGEN 0.2 01/12/2015 1539   NITRITE NEGATIVE 11/08/2023 1542   LEUKOCYTESUR NEGATIVE 11/08/2023 1542   Sepsis Labs Recent Labs  Lab 11/09/23 0542 11/10/23 0528 11/11/23 0518 11/12/23 0517  WBC 5.3 5.2 5.8 6.0   Microbiology Recent Results (from the past 240 hours)  Aerobic/Anaerobic Culture w  Gram Stain (surgical/deep wound)     Status: None (Preliminary result)   Collection Time: 11/09/23 11:31 AM   Specimen: Wound; Abdominal Fluid  Result Value Ref Range Status   Specimen Description   Final    WOUND ASCITIES Performed at [facility], [address] [place], [place] [address]    Special Requests   Final    NONE Performed at [facility], [address] [place], [place] [address]    Gram Stain NO WBC SEEN NO ORGANISMS SEEN   Final   Culture   Final    NO GROWTH 3 DAYS NO ANAEROBES ISOLATED; CULTURE IN PROGRESS FOR 5 DAYS Performed at [facility], [address], [place], [place] [address]    Report Status PENDING  Incomplete    Procedures/Studies: US  EKG SITE RITE Result Date: 11/10/2023 If Site Rite image not attached, placement could not be confirmed due to current cardiac rhythm.  CT PERC CHOLECYSTOSTOMY Result Date: 11/09/2023 INDICATION: 78 year old female a history of incarcerated abdominal wall hernia status post repair, and colostomy revision. She now has persistent abdominal pain with a preperitoneal fluid collection along the inner surface of the right upper quadrant abdominal wall. She has also experienced a small drop in hemoglobin. She presents for aspiration and possible drain placement if the collection appears purulent. EXAM: CT-guided aspiration MEDICATIONS: The patient is currently admitted to the hospital and receiving intravenous antibiotics. The antibiotics were administered within an appropriate time frame prior to the initiation of the procedure. ANESTHESIA/SEDATION: Moderate (conscious) sedation was employed during this procedure. A total of Versed  1 mg and Fentanyl  50 mcg was administered intravenously by the radiology nurse. Total intra-service moderate Sedation Time: 10 minutes. The patient's level of consciousness and vital signs were monitored continuously by radiology nursing throughout the procedure  under my direct supervision. COMPLICATIONS: None immediate. PROCEDURE: Informed written consent was obtained from the patient after a thorough discussion of the procedural risks, benefits and alternatives. All questions were addressed. Maximal Sterile Barrier Technique was utilized including caps, mask, sterile gowns, sterile gloves, sterile drape, hand hygiene and skin antiseptic. A timeout was performed prior to the initiation of the procedure. A planning CT scan was performed. The preperitoneal fluid collection was identified. A suitable skin entry site was selected and marked. Local anesthesia was attained by infiltration with 1% lidocaine . A small dermatotomy was made. Under intermittent CT guidance, an 18 gauge trocar needle was carefully advanced into the fluid collection. Aspiration was then performed yielding approximately 30 mL of golden serous fluid. No  foul smell or gross purulence. Follow-up CT imaging was performed confirming that the fluid collection is largely resolved. Therefore, no drainage catheter was placed. The needle was removed. IMPRESSION: CT-guided aspiration of preperitoneal fluid collection in the right upper quadrant yields 30 mL golden serous fluid. Samples were sent for culture. Electronically Signed   By: Wilkie Lent M.D.   On: 11/09/2023 12:55   CT ABDOMEN PELVIS WO CONTRAST Result Date: 11/08/2023 EXAM: CT ABDOMEN AND PELVIS WITHOUT CONTRAST 11/08/2023 10:55:00 PM TECHNIQUE: CT of the abdomen and pelvis was performed without the administration of intravenous contrast. Multiplanar reformatted images are provided for review. Automated exposure control, iterative reconstruction, and/or weight-based adjustment of the mA/kV was utilized to reduce the radiation dose to as low as reasonably achievable. COMPARISON: CT abdomen and pelvis 11/08/2023. CLINICAL HISTORY: Abdominal pain, post-op. FINDINGS: LOWER CHEST: There is atelectasis in the right lower lobe. LIVER: The liver is  unremarkable. GALLBLADDER AND BILE DUCTS: Pneumobilia again seen within the gallbladder and bile ducts. Gallstones are again present. There is some new gallbladder wall thickening diffusely. SPLEEN: No acute abnormality. PANCREAS: No acute abnormality. ADRENAL GLANDS: Low-density right adrenal nodule appears unchanged measuring 16 mm, likely abnormal. Left adrenal gland is within normal limits. KIDNEYS, URETERS AND BLADDER: There is a 13 mm left renal cyst. No stones in the kidneys or ureters. No hydronephrosis. No perinephric or periureteral stranding. Urinary bladder is unremarkable. GI AND BOWEL: Stomach demonstrates no acute abnormality. Right-sided ostomy is again seen. There is some wall thickening at the level of the ostomy. There is also some wall thickening of small bowel loops in the right anterior abdomen. There is no pneumatosis. There is no bowel obstruction. PERITONEUM AND RETROPERITONEUM: Anterior right upper quadrant fluid collection containing small foci of air, abutting the abdominal wall and ostomy, measures 13 x 1.4 x 17.5 cm and has not significantly changed. There is some punctate foci of air within the thickened right anterior lower abdominal wall which appears similar to prior. There is no gross free intraperitoneal air. No ascites. VASCULATURE: Aorta is normal in caliber. There are atherosclerotic calcifications of the aorta and iliac arteries. LYMPH NODES: No lymphadenopathy. REPRODUCTIVE ORGANS: No acute abnormality. BONES AND SOFT TISSUES: Scoliosis and degenerative change of the spine persists. No acute osseous abnormality. No focal soft tissue abnormality. IMPRESSION: 1. New diffuse gallbladder wall thickening with gallstones and pneumobilia. Findings are concerning for cholecystitis. 2. New Wall thickening at the level of the right-sided ostomy and small bowel loops possibly reactive enteritis . 3. Anterior right upper quadrant fluid collection containing small foci of air is  concerning for abscess and has not significantly changed. Electronically signed by: Greig Pique MD 11/08/2023 11:04 PM EST RP Workstation: [id number]   CT ABDOMEN PELVIS WO CONTRAST Result Date: 11/08/2023 EXAM: CT ABDOMEN AND PELVIS WITHOUT CONTRAST 11/08/2023 12:27:23 PM TECHNIQUE: CT of the abdomen and pelvis was performed without the administration of intravenous contrast. Multiplanar reformatted images are provided for review. Automated exposure control, iterative reconstruction, and/or weight-based adjustment of the mA/kV was utilized to reduce the radiation dose to as low as reasonably achievable. COMPARISON: 10/24/2023 CLINICAL HISTORY: Abdominal pain, post-op. FINDINGS: LOWER CHEST: Mild right basilar atelectasis is noted. LIVER: The liver is unremarkable. GALLBLADDER AND BILE DUCTS: Pneumobilia is noted. No biliary ductal dilatation. SPLEEN: No acute abnormality. PANCREAS: No acute abnormality. ADRENAL GLANDS: No acute abnormality. KIDNEYS, URETERS AND BLADDER: No stones in the kidneys or ureters. No hydronephrosis. No perinephric or periureteral stranding. Urinary bladder is unremarkable.  GI AND BOWEL: Colostomy noted in right lower quadrant. There is no bowel obstruction. PERITONEUM AND RETROPERITONEUM: Prostate fluid collection measuring 12x2 cm is noted in right upper quadrant concerning for possible abscess. This may extend to the colostomy site and into the right lower quadrant. No ascites. No free air. VASCULATURE: Aorta is normal in caliber. Aortic atherosclerosis. LYMPH NODES: No lymphadenopathy. REPRODUCTIVE ORGANS: No acute abnormality. BONES AND SOFT TISSUES: Findings consistent with prior ventral hernia repair. No acute osseous abnormality. No focal soft tissue abnormality. IMPRESSION: 1. right upper quadrant fluid collection measuring 12x2 cm, concerning for abscess, with potential extension to the colostomy site and into the right lower quadrant. 2. pneumobilia and probable biliary  sludge. 3. Postoperative changes consistent with prior ventral hernia repair. Electronically signed by: Lynwood Seip MD 11/08/2023 01:58 PM EST RP Workstation: [id number]   DG Abd Portable 1V-Small Bowel Obstruction Protocol-initial, 8 hr delay Result Date: 10/27/2023 EXAM: CT ABDOMEN WITHOUT CONTRAST 10/27/2023 08:14:00 PM TECHNIQUE: CT of the abdomen was performed without the administration of intravenous contrast. Multiplanar reformatted images are provided for review. Automated exposure control, iterative reconstruction, and/or weight-based adjustment of the mA/kV was utilized to reduce the radiation dose to as low as reasonably achievable. COMPARISON: CT examination of October 24, 2023. CLINICAL HISTORY: Small bowel obstruction FINDINGS: LOWER CHEST: Visualized portion of the lower chest demonstrates no acute abnormality. HEPATOBILIARY: The visualized liver is unremarkable. Gallbladder is unremarkable. SPLEEN: Spleen demonstrates no acute abnormality. PANCREAS: Pancreas demonstrates no acute abnormality. ADRENAL GLANDS: Adrenal glands demonstrate no acute abnormality. KIDNEYS: No stones in the kidneys or proximal ureters. No hydronephrosis. No perinephric or periureteral stranding. GI AND BOWEL: A nasogastric tube is seen with loops within the expected gastric fundus. Numerous loops of dilated small bowel are seen throughout the abdomen, in keeping with a distal small bowel obstruction or adynamic ileus. Administered oral contrast opacifies the numerous loops of dilated small bowel but does not clearly extend to the terminal small bowel seen within the right lower quadrant, in the region of the ileostomy. PERITONEUM AND RETROPERITONEUM: No ascites or free air. Aorta is normal in caliber. LYMPH NODES: No lymphadenopathy. BONES AND SOFT TISSUES: Severe thoracolumbar levoscoliosis is noted. No acute abnormality of the visualized bones. No focal soft tissue abnormality. IMPRESSION: 1. Distal small bowel  obstruction versus adynamic ileus, with oral contrast opacifying dilated small bowel loops but not clearly reaching the terminal small bowel in the right lower quadrant near the ileostomy; correlate clinically and consider serial abdominal radiographs or repeat CT for progression. 2. Enteric tube terminates in the gastric fundus. 3. Severe thoracolumbar levoscoliosis again noted. Electronically signed by: Dorethia Molt MD 10/27/2023 08:19 PM EDT RP Workstation: [id number]   DG Abd Portable 1V Result Date: 10/27/2023 EXAM: 1 VIEW XRAY OF THE ABDOMEN 10/27/2023 03:11:00 AM CLINICAL HISTORY: Encounter for imaging study to confirm nasogastric (NG) tube placement [id number]. NG tube placement COMPARISON: 10/26/2023 FINDINGS: BOWEL: Dilated gas-filled small bowel loops in abdomen up to 4.8 cm diameter, unchanged. Nonobstructive bowel gas pattern. SOFT TISSUES: Enteric tube in place with tip in gastric fundus. BONES: Stable severe levoconvex lumbar scoliosis. No acute osseous abnormality. IMPRESSION: 1. Dilated gas-filled small bowel loops in abdomen up to 4.8 cm diameter, unchanged. 2. Enteric tube in place with tip in gastric fundus. Electronically signed by: Dorethia Molt MD 10/27/2023 03:25 AM EDT RP Workstation: [id number]   DG Abd 1 View Result Date: 10/26/2023 CLINICAL DATA:  NG tube placement. EXAM: ABDOMEN - 1 VIEW COMPARISON:  CT 10/24/2023  FINDINGS: Tip and side port of the enteric tube in the left upper quadrant, likely within the stomach when compared with prior CT. Gaseous bowel distension centrally. Left the drains are partially included in the field of view. IMPRESSION: Tip and side port of the enteric tube in the left upper quadrant, likely within the stomach. Electronically Signed   By: Andrea Gasman M.D.   On: 10/26/2023 20:51   CT ABDOMEN PELVIS WO CONTRAST Result Date: 10/24/2023 CLINICAL DATA:  Abdominal pain postop peristomal and incisional hernia repairs. Evaluate for ileus versus  small-bowel obstruction. EXAM: CT ABDOMEN AND PELVIS WITHOUT CONTRAST TECHNIQUE: Multidetector CT imaging of the abdomen and pelvis was performed following the standard protocol without IV contrast. RADIATION DOSE REDUCTION: This exam was performed according to the departmental dose-optimization program which includes automated exposure control, adjustment of the mA and/or kV according to patient size and/or use of iterative reconstruction technique. COMPARISON:  Abdominopelvic CT 08/22/2023 and 01/30/2019. FINDINGS: Lower chest: Moderate subsegmental atelectasis at both lung bases. No significant pleural effusion or basilar pneumothorax. There is soft tissue emphysema within the anterior chest wall attributed to the recent surgery. Hepatobiliary: The liver is normal in density without suspicious focal abnormality on noncontrast imaging. Persistent pneumobilia, likely secondary to previous sphincterotomy. Dependent soft tissue in the gallbladder lumen may reflect noncalcified stones or sludge. No gallbladder wall thickening or surrounding inflammation. Pancreas: Unremarkable. No pancreatic ductal dilatation or surrounding inflammatory changes. Spleen: Normal in size without focal abnormality. Adrenals/Urinary Tract: Stable 2.1 cm right adrenal nodule measuring 14 HU this is compatible with an incidental adenoma based on stability from 2021. No specific follow-up imaging recommended. The left adrenal gland appears normal. No evidence of urinary tract calculus, suspicious renal lesion or hydronephrosis. Unchanged left renal cyst for which no specific follow-up imaging is recommended. The bladder is nearly empty and suboptimally evaluated. There is a small amount of air in the bladder lumen, likely secondary to recent instrumentation/catheterization. Stomach/Bowel: Enteric contrast has passed into the mid small bowel. The stomach appears unremarkable for its degree of distention. Mild diffuse small and large bowel  distension with scattered air-fluid levels. No focal transition point identified. Appearance is most consistent with a postoperative ileus. No evidence of contrast leak. Vascular/Lymphatic: There are no enlarged abdominal or pelvic lymph nodes. Aortic and branch vessel atherosclerosis without evidence of aneurysm. Reproductive: The uterus and ovaries appear unremarkable. No adnexal mass. Other: Interval ventral hernia repair with surgical drains in the anterior abdominal wall. There is a small amount of residual ill-defined fluid and air in the right lower quadrant abdominal wall adjacent to the ileostomy. Small left periumbilical air-fluid collection at site of repaired hernia measures 3.4 x 1.6 cm on image 57/2. There is air within the anterior abdominal wall attributed to the interval surgery. No focal intra-fluid collection or pneumoperitoneum identified. Musculoskeletal: No acute or significant osseous findings. Severe convex left thoracolumbar scoliosis with associated multilevel spondylosis. IMPRESSION: 1. Interval ventral hernia repair with surgical drains in the anterior abdominal wall. There is a small amount of residual ill-defined fluid and air in the right lower quadrant abdominal wall adjacent to the ileostomy. Small left periumbilical air-fluid collection at site of repaired hernia. 2. Mild diffuse small and large bowel distension with scattered air-fluid levels, most consistent with a postoperative ileus. No focal transition point or other signs of bowel obstruction identified. Radiographic follow up recommended. 3. No focal intra-fluid collection or pneumoperitoneum identified. 4. Moderate bibasilar atelectasis. 5.  Aortic Atherosclerosis (ICD10-I70.0). Electronically Signed  By: Elsie Perone M.D.   On: 10/24/2023 16:47     Time coordinating discharge: Over 30 minutes    Alm Apo, MD  [facility] 11/12/2023, 1:04 PM

## 2023-11-13 ENCOUNTER — Other Ambulatory Visit: Payer: Self-pay

## 2023-11-13 DIAGNOSIS — N179 Acute kidney failure, unspecified: Secondary | ICD-10-CM | POA: Diagnosis not present

## 2023-11-13 DIAGNOSIS — Z9049 Acquired absence of other specified parts of digestive tract: Secondary | ICD-10-CM | POA: Diagnosis not present

## 2023-11-13 DIAGNOSIS — Z09 Encounter for follow-up examination after completed treatment for conditions other than malignant neoplasm: Secondary | ICD-10-CM | POA: Diagnosis not present

## 2023-11-13 DIAGNOSIS — Z452 Encounter for adjustment and management of vascular access device: Secondary | ICD-10-CM | POA: Diagnosis not present

## 2023-11-13 DIAGNOSIS — E860 Dehydration: Secondary | ICD-10-CM | POA: Diagnosis not present

## 2023-11-13 DIAGNOSIS — K635 Polyp of colon: Secondary | ICD-10-CM | POA: Diagnosis not present

## 2023-11-14 ENCOUNTER — Other Ambulatory Visit: Payer: Self-pay

## 2023-11-14 ENCOUNTER — Other Ambulatory Visit: Payer: Self-pay | Admitting: Internal Medicine

## 2023-11-14 DIAGNOSIS — Z1231 Encounter for screening mammogram for malignant neoplasm of breast: Secondary | ICD-10-CM

## 2023-11-14 NOTE — Progress Notes (Signed)
 Specialty Pharmacy Refill Coordination Note  Donna [name] is a 78 y.o. female contacted today regarding refills of specialty medication(s) Evolocumab  (Repatha  SureClick)   Patient requested Delivery   Delivery date: 11/26/23   Verified address: [address]   [name] [id number]   Medication will be filled on: 11/25/23

## 2023-11-15 DIAGNOSIS — K635 Polyp of colon: Secondary | ICD-10-CM | POA: Diagnosis not present

## 2023-11-15 DIAGNOSIS — E860 Dehydration: Secondary | ICD-10-CM | POA: Diagnosis not present

## 2023-11-15 DIAGNOSIS — Z932 Ileostomy status: Secondary | ICD-10-CM | POA: Diagnosis not present

## 2023-11-15 LAB — AEROBIC/ANAEROBIC CULTURE W GRAM STAIN (SURGICAL/DEEP WOUND)
Culture: NO GROWTH
Gram Stain: NONE SEEN

## 2023-11-18 DIAGNOSIS — Z932 Ileostomy status: Secondary | ICD-10-CM | POA: Diagnosis not present

## 2023-11-18 DIAGNOSIS — I129 Hypertensive chronic kidney disease with stage 1 through stage 4 chronic kidney disease, or unspecified chronic kidney disease: Secondary | ICD-10-CM | POA: Diagnosis not present

## 2023-11-18 DIAGNOSIS — K635 Polyp of colon: Secondary | ICD-10-CM | POA: Diagnosis not present

## 2023-11-18 DIAGNOSIS — Z48815 Encounter for surgical aftercare following surgery on the digestive system: Secondary | ICD-10-CM | POA: Diagnosis not present

## 2023-11-18 DIAGNOSIS — E860 Dehydration: Secondary | ICD-10-CM | POA: Diagnosis not present

## 2023-11-18 DIAGNOSIS — N1832 Chronic kidney disease, stage 3b: Secondary | ICD-10-CM | POA: Diagnosis not present

## 2023-11-18 DIAGNOSIS — K5190 Ulcerative colitis, unspecified, without complications: Secondary | ICD-10-CM | POA: Diagnosis not present

## 2023-11-20 DIAGNOSIS — Z932 Ileostomy status: Secondary | ICD-10-CM | POA: Diagnosis not present

## 2023-11-20 DIAGNOSIS — E860 Dehydration: Secondary | ICD-10-CM | POA: Diagnosis not present

## 2023-11-20 DIAGNOSIS — K635 Polyp of colon: Secondary | ICD-10-CM | POA: Diagnosis not present

## 2023-11-22 ENCOUNTER — Other Ambulatory Visit: Payer: Self-pay | Admitting: Internal Medicine

## 2023-11-22 DIAGNOSIS — D508 Other iron deficiency anemias: Secondary | ICD-10-CM | POA: Diagnosis not present

## 2023-11-22 DIAGNOSIS — K91873 Postprocedural seroma of a digestive system organ or structure following other procedure: Secondary | ICD-10-CM

## 2023-11-22 DIAGNOSIS — K219 Gastro-esophageal reflux disease without esophagitis: Secondary | ICD-10-CM | POA: Diagnosis not present

## 2023-11-22 DIAGNOSIS — K5100 Ulcerative (chronic) pancolitis without complications: Secondary | ICD-10-CM | POA: Diagnosis not present

## 2023-11-22 DIAGNOSIS — Z932 Ileostomy status: Secondary | ICD-10-CM | POA: Diagnosis not present

## 2023-11-22 DIAGNOSIS — E860 Dehydration: Secondary | ICD-10-CM | POA: Diagnosis not present

## 2023-11-22 DIAGNOSIS — K635 Polyp of colon: Secondary | ICD-10-CM | POA: Diagnosis not present

## 2023-11-25 ENCOUNTER — Other Ambulatory Visit: Payer: Self-pay

## 2023-11-25 DIAGNOSIS — K635 Polyp of colon: Secondary | ICD-10-CM | POA: Diagnosis not present

## 2023-11-25 DIAGNOSIS — E860 Dehydration: Secondary | ICD-10-CM | POA: Diagnosis not present

## 2023-11-25 DIAGNOSIS — Z932 Ileostomy status: Secondary | ICD-10-CM | POA: Diagnosis not present

## 2023-11-26 ENCOUNTER — Encounter: Payer: Self-pay | Admitting: Internal Medicine

## 2023-11-27 DIAGNOSIS — E860 Dehydration: Secondary | ICD-10-CM | POA: Diagnosis not present

## 2023-11-27 DIAGNOSIS — K635 Polyp of colon: Secondary | ICD-10-CM | POA: Diagnosis not present

## 2023-11-27 DIAGNOSIS — Z932 Ileostomy status: Secondary | ICD-10-CM | POA: Diagnosis not present

## 2023-11-29 DIAGNOSIS — Z932 Ileostomy status: Secondary | ICD-10-CM | POA: Diagnosis not present

## 2023-11-29 DIAGNOSIS — K635 Polyp of colon: Secondary | ICD-10-CM | POA: Diagnosis not present

## 2023-11-29 DIAGNOSIS — E860 Dehydration: Secondary | ICD-10-CM | POA: Diagnosis not present

## 2023-12-02 DIAGNOSIS — K635 Polyp of colon: Secondary | ICD-10-CM | POA: Diagnosis not present

## 2023-12-02 DIAGNOSIS — E860 Dehydration: Secondary | ICD-10-CM | POA: Diagnosis not present

## 2023-12-02 DIAGNOSIS — Z932 Ileostomy status: Secondary | ICD-10-CM | POA: Diagnosis not present

## 2023-12-03 ENCOUNTER — Inpatient Hospital Stay (HOSPITAL_COMMUNITY)
Admission: EM | Admit: 2023-12-03 | Discharge: 2023-12-11 | DRG: 862 | Disposition: A | Attending: Internal Medicine | Admitting: Internal Medicine

## 2023-12-03 ENCOUNTER — Emergency Department (HOSPITAL_COMMUNITY)

## 2023-12-03 ENCOUNTER — Encounter (HOSPITAL_COMMUNITY): Payer: Self-pay

## 2023-12-03 ENCOUNTER — Other Ambulatory Visit: Payer: Self-pay

## 2023-12-03 DIAGNOSIS — K651 Peritoneal abscess: Secondary | ICD-10-CM | POA: Diagnosis not present

## 2023-12-03 DIAGNOSIS — Z932 Ileostomy status: Secondary | ICD-10-CM | POA: Diagnosis not present

## 2023-12-03 DIAGNOSIS — Z9889 Other specified postprocedural states: Secondary | ICD-10-CM | POA: Diagnosis not present

## 2023-12-03 DIAGNOSIS — Z85038 Personal history of other malignant neoplasm of large intestine: Secondary | ICD-10-CM | POA: Diagnosis not present

## 2023-12-03 DIAGNOSIS — D649 Anemia, unspecified: Secondary | ICD-10-CM

## 2023-12-03 DIAGNOSIS — B961 Klebsiella pneumoniae [K. pneumoniae] as the cause of diseases classified elsewhere: Secondary | ICD-10-CM | POA: Diagnosis not present

## 2023-12-03 DIAGNOSIS — K219 Gastro-esophageal reflux disease without esophagitis: Secondary | ICD-10-CM | POA: Diagnosis not present

## 2023-12-03 DIAGNOSIS — T8579XD Infection and inflammatory reaction due to other internal prosthetic devices, implants and grafts, subsequent encounter: Secondary | ICD-10-CM | POA: Diagnosis not present

## 2023-12-03 DIAGNOSIS — R935 Abnormal findings on diagnostic imaging of other abdominal regions, including retroperitoneum: Secondary | ICD-10-CM | POA: Diagnosis not present

## 2023-12-03 DIAGNOSIS — T8579XA Infection and inflammatory reaction due to other internal prosthetic devices, implants and grafts, initial encounter: Secondary | ICD-10-CM | POA: Diagnosis not present

## 2023-12-03 DIAGNOSIS — K51819 Other ulcerative colitis with unspecified complications: Secondary | ICD-10-CM | POA: Diagnosis not present

## 2023-12-03 DIAGNOSIS — K635 Polyp of colon: Secondary | ICD-10-CM | POA: Diagnosis not present

## 2023-12-03 DIAGNOSIS — B999 Unspecified infectious disease: Secondary | ICD-10-CM | POA: Diagnosis not present

## 2023-12-03 DIAGNOSIS — K9412 Enterostomy infection: Secondary | ICD-10-CM | POA: Diagnosis not present

## 2023-12-03 DIAGNOSIS — G4733 Obstructive sleep apnea (adult) (pediatric): Secondary | ICD-10-CM | POA: Diagnosis not present

## 2023-12-03 DIAGNOSIS — N1831 Chronic kidney disease, stage 3a: Secondary | ICD-10-CM

## 2023-12-03 DIAGNOSIS — K8020 Calculus of gallbladder without cholecystitis without obstruction: Secondary | ICD-10-CM | POA: Diagnosis not present

## 2023-12-03 DIAGNOSIS — I10 Essential (primary) hypertension: Secondary | ICD-10-CM | POA: Diagnosis present

## 2023-12-03 DIAGNOSIS — L02211 Cutaneous abscess of abdominal wall: Secondary | ICD-10-CM | POA: Diagnosis not present

## 2023-12-03 DIAGNOSIS — Z8719 Personal history of other diseases of the digestive system: Secondary | ICD-10-CM | POA: Diagnosis not present

## 2023-12-03 DIAGNOSIS — R531 Weakness: Secondary | ICD-10-CM | POA: Diagnosis not present

## 2023-12-03 DIAGNOSIS — B954 Other streptococcus as the cause of diseases classified elsewhere: Secondary | ICD-10-CM | POA: Diagnosis not present

## 2023-12-03 DIAGNOSIS — R188 Other ascites: Secondary | ICD-10-CM

## 2023-12-03 DIAGNOSIS — E860 Dehydration: Secondary | ICD-10-CM | POA: Diagnosis not present

## 2023-12-03 DIAGNOSIS — A499 Bacterial infection, unspecified: Secondary | ICD-10-CM | POA: Diagnosis not present

## 2023-12-03 LAB — URINALYSIS, ROUTINE W REFLEX MICROSCOPIC
Bilirubin Urine: NEGATIVE
Glucose, UA: NEGATIVE mg/dL
Hgb urine dipstick: NEGATIVE
Ketones, ur: NEGATIVE mg/dL
Leukocytes,Ua: NEGATIVE
Nitrite: NEGATIVE
Protein, ur: NEGATIVE mg/dL
Specific Gravity, Urine: 1.01 (ref 1.005–1.030)
pH: 5 (ref 5.0–8.0)

## 2023-12-03 LAB — CBC
HCT: 22.8 % — ABNORMAL LOW (ref 36.0–46.0)
Hemoglobin: 8 g/dL — ABNORMAL LOW (ref 12.0–15.0)
MCH: 29.6 pg (ref 26.0–34.0)
MCHC: 35.1 g/dL (ref 30.0–36.0)
MCV: 84.4 fL (ref 80.0–100.0)
Platelets: 451 K/uL — ABNORMAL HIGH (ref 150–400)
RBC: 2.7 MIL/uL — ABNORMAL LOW (ref 3.87–5.11)
RDW: 16.5 % — ABNORMAL HIGH (ref 11.5–15.5)
WBC: 10.1 K/uL (ref 4.0–10.5)
nRBC: 0 % (ref 0.0–0.2)

## 2023-12-03 LAB — COMPREHENSIVE METABOLIC PANEL WITH GFR
ALT: 23 U/L (ref 0–44)
AST: 29 U/L (ref 15–41)
Albumin: 3.2 g/dL — ABNORMAL LOW (ref 3.5–5.0)
Alkaline Phosphatase: 133 U/L — ABNORMAL HIGH (ref 38–126)
Anion gap: 13 (ref 5–15)
BUN: 21 mg/dL (ref 8–23)
CO2: 23 mmol/L (ref 22–32)
Calcium: 9.2 mg/dL (ref 8.9–10.3)
Chloride: 100 mmol/L (ref 98–111)
Creatinine, Ser: 1.32 mg/dL — ABNORMAL HIGH (ref 0.44–1.00)
GFR, Estimated: 42 mL/min — ABNORMAL LOW (ref >60)
Glucose, Bld: 127 mg/dL — ABNORMAL HIGH (ref 70–99)
Potassium: 3.6 mmol/L (ref 3.5–5.1)
Sodium: 136 mmol/L (ref 135–145)
Total Bilirubin: 0.4 mg/dL (ref 0.0–1.2)
Total Protein: 6.8 g/dL (ref 6.5–8.1)

## 2023-12-03 LAB — LIPASE, BLOOD: Lipase: 20 U/L (ref 11–51)

## 2023-12-03 MED ORDER — FENTANYL CITRATE (PF) 50 MCG/ML IJ SOSY
50.0000 ug | PREFILLED_SYRINGE | Freq: Once | INTRAMUSCULAR | Status: AC
  Administered 2023-12-03: 50 ug via INTRAVENOUS
  Filled 2023-12-03: qty 1

## 2023-12-03 MED ORDER — ONDANSETRON HCL 4 MG PO TABS: 4.0000 mg | ORAL_TABLET | Freq: Four times a day (QID) | ORAL | Status: DC | PRN

## 2023-12-03 MED ORDER — ACETAMINOPHEN 325 MG PO TABS: 650.0000 mg | ORAL_TABLET | Freq: Four times a day (QID) | ORAL | Status: DC | PRN

## 2023-12-03 MED ORDER — SENNOSIDES-DOCUSATE SODIUM 8.6-50 MG PO TABS: 1.0000 | ORAL_TABLET | Freq: Every evening | ORAL | Status: DC | PRN

## 2023-12-03 MED ORDER — ONDANSETRON HCL 4 MG/2ML IJ SOLN: 4.0000 mg | Freq: Four times a day (QID) | INTRAMUSCULAR | Status: DC | PRN

## 2023-12-03 MED ORDER — SODIUM CHLORIDE 0.9 % IV SOLN: INTRAVENOUS | Status: AC

## 2023-12-03 MED ORDER — BISACODYL 5 MG PO TBEC: 5.0000 mg | DELAYED_RELEASE_TABLET | Freq: Every day | ORAL | Status: DC | PRN

## 2023-12-03 MED ORDER — HYDROMORPHONE HCL 1 MG/ML IJ SOLN
0.5000 mg | Freq: Four times a day (QID) | INTRAMUSCULAR | Status: DC | PRN
  Administered 2023-12-04 – 2023-12-11 (×10): 0.5 mg via INTRAVENOUS
  Filled 2023-12-03 (×11): qty 0.5

## 2023-12-03 MED ORDER — ACETAMINOPHEN 650 MG RE SUPP: 650.0000 mg | Freq: Four times a day (QID) | RECTAL | Status: DC | PRN

## 2023-12-03 MED ORDER — PIPERACILLIN-TAZOBACTAM 3.375 G IVPB
3.3750 g | Freq: Three times a day (TID) | INTRAVENOUS | Status: DC
  Administered 2023-12-03 – 2023-12-11 (×23): 3.375 g via INTRAVENOUS
  Filled 2023-12-03 (×24): qty 50

## 2023-12-03 MED ORDER — ENOXAPARIN SODIUM 40 MG/0.4ML IJ SOSY: 40.0000 mg | PREFILLED_SYRINGE | INTRAMUSCULAR | Status: DC

## 2023-12-03 NOTE — Patient Instructions (Incomplete)

## 2023-12-03 NOTE — ED Triage Notes (Signed)
 Pt reports she noticed white pus draining out from her ileostomy that she noticed Saturday.  She had a robotic TAR component separation and repair of recurrent incisional and parastomal hernias around her permanent ileostomy. Denies fever/chills/n/v/d. She reports pain at the site.

## 2023-12-03 NOTE — Progress Notes (Deleted)
 PATIENT: Donna [name] DOB: 1945/04/23  REASON FOR VISIT: follow up HISTORY FROM: patient  No chief complaint on file.    HISTORY OF PRESENT ILLNESS:  12/03/23 ALL:  Donna [name] returns for follow up for OSA on CPAP. She is doing   04/10/2023 ALL:  Donna [name] is a 78 y.o. female here today for follow up for OSA on CPAP.  She was last seen in consult with Dr Buck for history of OSA on CPAP with disrupted sleep and excessive daytime sleepiness. PSG 09/2022 showed overall mild obstructive sleep apnea, severe in REM sleep with a total AHI of 12.9/hour, REM AHI of 36.8/hour, supine AHI of 13.4/hour and O2 nadir of 86%. New autoPAP advised. She is adjusting fairly well. She is wearing a nasal pillow and feels it is aggravating her nose. She called DME and she is being sent a new mask but unsure of what type. She is not sure she has noted any benefit of using CPAP. She is up and down all night with colostomy bag. She is caring for her aunt who is ill. She helps her neighbor with care giver needs. She is motivated to continue using therapy.      HISTORY: (copied from Dr Obie previous note)  Dear Dr. Elmira,   I saw your patient, Donna [name], upon your kind request in my sleep clinic today for initial consultation of her sleep disorder, in particular, concern for underlying obstructive sleep apnea.  The patient is unaccompanied today.  As you know, Ms. Kohen is a 78 year old female with an underlying medical history of hypertension, hyperlipidemia, exertional dyspnea, arthritis, with s/p L TKA, LBP, colon cancer, reflux disease, prediabetes, ulcerative colitis with s/p total colectomy, and mild obesity, who reports sleep disruption and excessive daytime somnolence.  Her Epworth sleepiness score is 11 out of 24, fatigue severity score is 43 out of 63.  I reviewed your office note from 04/26/2022.  Patient has sleep apnea and uses a CPAP machine.  She has a variable sleep  schedule, generally goes to bed before midnight and rise time is around 8 but she is often awake in the middle of the night and has trouble maintaining sleep.  She has a colostomy bag and empties it once or twice per night and also urinates about twice or once per night on average.  She denies recurrent nocturnal or morning headaches.  She lives with her middle son, her oldest son has sleep apnea.  She has a total of 3 sons.  She works for [employer] for over 44 years.  She is divorced.  She is a non-smoker and does not drink any alcohol, she limits her caffeine to 1 cup of coffee per day on average.   REVIEW OF SYSTEMS: Out of a complete 14 system review of symptoms, the patient complains only of the following symptoms, fatigue and all other reviewed systems are negative.  ESS: 13/24, previously 11/24  ALLERGIES: Allergies  Allergen Reactions   Ozempic (0.25 Or 0.5 Mg-Dose) [Semaglutide(0.25 Or 0.5mg -Dos)] Nausea And Vomiting   Ezetimibe Other (See Comments)    Unknown   Gabapentin  Nausea And Vomiting    N/V and headaches   Iodinated Contrast Media Other (See Comments)    Feels faint, coughing, bp elevated   Mesalamine Other (See Comments)    Unknown   Oxycodone  Other (See Comments)    Hallucinations   Prilosec [Omeprazole ] Other (See Comments)    Made legs hurt, hair fell out, and  nauseated.    Statins Other (See Comments)    Muscle spasms     HOME MEDICATIONS: Outpatient Medications Prior to Visit  Medication Sig Dispense Refill   acetaminophen  (TYLENOL ) 500 MG tablet Take 1,000 mg by mouth every 6 (six) hours as needed for mild pain or headache.     amLODipine  (NORVASC ) 10 MG tablet Take 10 mg by mouth at bedtime.     aspirin  EC 81 MG tablet Take 1 tablet (81 mg total) by mouth daily. Swallow whole. 90 tablet 3   B Complex Vitamins (B COMPLEX 100 PO) Take 1 tablet by mouth daily.     benazepril  (LOTENSIN ) 40 MG tablet Take 40 mg by mouth at bedtime.     calcium  carbonate  (OS-CAL - DOSED IN MG OF ELEMENTAL CALCIUM ) 1250 (500 Ca) MG tablet Take 2 tablets by mouth 2 (two) times daily with a meal.     cyclobenzaprine  (FLEXERIL ) 5 MG tablet Take 1 tablet (5 mg total) by mouth 3 (three) times daily as needed for muscle spasms. 20 tablet 2   Evolocumab  (REPATHA  SURECLICK) 140 MG/ML SOAJ Inject 140 mg (1 pen) into the skin every 14 days 6 mL 3   famotidine  (PEPCID ) 20 MG tablet Take 20 mg by mouth daily.     hydrochlorothiazide  (HYDRODIURIL ) 25 MG tablet Take 25 mg by mouth daily.     labetalol  (NORMODYNE ) 100 MG tablet TAKE 1 TABLET TWICE DAILY (NEED TO KEEP UPCOMING APPOINTMENT IN APRIL TO PREVENT ANY MISSED DOSES) (Patient taking differently: Take 100 mg by mouth 2 (two) times daily.) 180 tablet 3   latanoprost  (XALATAN ) 0.005 % ophthalmic solution Place 1 drop into both eyes at bedtime.     loperamide  (IMODIUM ) 2 MG capsule Take 1 capsule (2 mg total) by mouth 2 (two) times daily. (Patient taking differently: Take 4 mg by mouth in the morning, at noon, and at bedtime.) 60 capsule 1   Menthol -Methyl Salicylate (MUSCLE RUB) 10-15 % CREA Apply 1 application topically 2 (two) times daily as needed for muscle pain.     metoCLOPramide  (REGLAN ) 5 MG tablet Take 1 tablet (5 mg total) by mouth every 8 (eight) hours as needed for nausea or vomiting. 30 tablet 5   Multiple Vitamin (MULTIVITAMIN WITH MINERALS) TABS tablet Take 1 tablet by mouth daily. Woman's one a day     Multiple Vitamins-Iron  (MULTIVITAMINS WITH IRON ) TABS tablet Take 1 tablet by mouth daily.     pantoprazole  (PROTONIX ) 40 MG tablet Take 40 mg by mouth 2 (two) times daily before a meal.      polycarbophil (FIBERCON) 625 MG tablet Take 1 tablet (625 mg total) by mouth 2 (two) times daily. 60 tablet 0   psyllium (HYDROCIL/METAMUCIL) 95 % PACK Take 1 packet by mouth daily. 30 packet 1   timolol  (TIMOPTIC ) 0.5 % ophthalmic solution Place 1 drop into both eyes every morning.     traMADol  (ULTRAM ) 50 MG tablet Take 1  tablet (50 mg total) by mouth every 6 (six) hours as needed for moderate pain (pain score 4-6) or severe pain (pain score 7-10). 20 tablet 0   traZODone  (DESYREL ) 50 MG tablet Take 50 mg by mouth at bedtime.     No facility-administered medications prior to visit.    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  Degeneration of lumbar or lumbosacral intervertebral disc 01/21/2015   Essential hypertension 01/21/2015   GERD (gastroesophageal reflux disease) 01/21/2015   Heart murmur    ECHO 05-17-2023   History of excision of intestinal structure 10/18/2023   Hyperlipidemia 01/21/2015   Hypertension    Pre-diabetes    diet   S/P total colectomy 05/29/2019   Ulcerative colitis (HCC)     PAST SURGICAL HISTORY: Past Surgical History:  Procedure Laterality Date   BIOPSY  06/29/2022   Procedure: BIOPSY;  Surgeon: Teresa Lonni HERO, MD;  Location: [facility];  Service: General;;   CESAREAN SECTION     COLON SURGERY  2015   for twisted bowel   ERCP N/A 07/10/2023   Procedure: ERCP, WITH INTERVENTION IF INDICATED;  Surgeon: Rosalie Kitchens, MD;  Location: [facility];  Service: Gastroenterology;  Laterality: N/A;   EYE SURGERY Right    cataract extraction   FLEXIBLE SIGMOIDOSCOPY N/A 05/18/2020   Procedure: SURVEILLANCE FLEXIBLE SIGMOIDOSCOPY;  Surgeon: Teresa Lonni HERO, MD;  Location: [facility];  Service: General;  Laterality: N/A;   FLEXIBLE SIGMOIDOSCOPY N/A 06/29/2022   Procedure: FLEXIBLE SIGMOIDOSCOPY;  Surgeon: Teresa Lonni HERO, MD;  Location: [facility];  Service: General;  Laterality: N/A;   FLEXIBLE SIGMOIDOSCOPY Left 08/29/2023   Procedure: KINGSTON SIDE;  Surgeon: Teresa Lonni HERO, MD;  Location: [facility];  Service: General;  Laterality: Left;   INCISIONAL HERNIA REPAIR N/A 05/29/2019   Procedure: PRIMARY INCISIONAL  HERNIA REPAIR;  Surgeon: Teresa Lonni HERO, MD;  Location: [facility];  Service: General;  Laterality: N/A;   LAPAROTOMY Right 01/12/2015   Procedure: OPEN RIGHT COLECTOMY;  Surgeon: Donnice Bury, MD;  [facility];  Service: General;  Laterality: Right;   RIGHT COLECTOMY  01/12/2015   TOTAL KNEE ARTHROPLASTY Left 06/20/2018   Procedure: TOTAL KNEE ARTHROPLASTY;  Surgeon: Yvone Rush, MD;  Location: [facility];  Service: Orthopedics;  Laterality: Left;   XI ROBOTIC ASSISTED PARASTOMAL HERNIA REPAIR N/A 10/18/2023   Procedure: REPAIR, HERNIA, PARASTOMAL, ROBOT-ASSISTED;  Surgeon: Sheldon Standing, MD;  Location: [facility];  Service: General;  Laterality: N/A;  ROBOTIC PARASTOMAL & INCISIONAL HERNIA REPAIRS WITH MESH   XI ROBOTIC ASSISTED VENTRAL HERNIA N/A 10/18/2023   Procedure: REPAIR, HERNIA, VENTRAL, ROBOT-ASSISTED;  Surgeon: Sheldon Standing, MD;  Location: [facility];  Service: General;  Laterality: N/A;    FAMILY HISTORY: Family History  Problem Relation Age of Onset   Diabetes Mother    Ulcerative colitis Father    Heart failure Sister    Sleep apnea Son     SOCIAL HISTORY: Social History   Socioeconomic History   Marital status: Divorced    Spouse name: Not on file   Number of children: 3   Years of education: Not on file   Highest education level: Not on file  Occupational History   Not on file  Tobacco Use   Smoking status: Never    Passive exposure: Past   Smokeless tobacco: Never  Vaping Use   Vaping status: Never Used  Substance and Sexual Activity   Alcohol use: No   Drug use: No   Sexual activity: Not Currently  Other Topics Concern   Not on file  Social History Narrative   Not on file   Social Drivers of Health   Financial Resource Strain: Not on file  Food Insecurity: No Food Insecurity (11/08/2023)   Hunger Vital Sign    Worried About Running Out of Food in the Last Year: Never true  Ran Out of Food in the Last Year: Never true  Transportation Needs: No  Transportation Needs (11/08/2023)   PRAPARE - Administrator, Civil Service (Medical): No    Lack of Transportation (Non-Medical): No  Physical Activity: Not on file  Stress: Not on file  Social Connections: Moderately Integrated (11/08/2023)   Social Connection and Isolation Panel    Frequency of Communication with Friends and Family: Three times a week    Frequency of Social Gatherings with Friends and Family: Once a week    Attends Religious Services: More than 4 times per year    Active Member of [employer] or Organizations: Yes    Attends Banker Meetings: 1 to 4 times per year    Marital Status: Divorced  Intimate Partner Violence: Not At Risk (11/08/2023)   Humiliation, Afraid, Rape, and Kick questionnaire    Fear of Current or Ex-Partner: No    Emotionally Abused: No    Physically Abused: No    Sexually Abused: No     PHYSICAL EXAM  There were no vitals filed for this visit.  There is no height or weight on file to calculate BMI.  Generalized: Well developed, in no acute distress  Cardiology: normal rate and rhythm, no murmur noted Respiratory: clear to auscultation bilaterally  Neurological examination  Mentation: Alert oriented to time, place, history taking. Follows all commands speech and language fluent Cranial nerve II-XII: Pupils were equal round reactive to light. Extraocular movements were full, visual field were full  Motor: The motor testing reveals 5 over 5 strength of all 4 extremities. Good symmetric motor tone is noted throughout.  Gait and station: Gait is normal.    DIAGNOSTIC DATA (LABS, IMAGING, TESTING) - I reviewed patient records, labs, notes, testing and imaging myself where available.      No data to display           Lab Results  Component Value Date   WBC 6.0 11/12/2023   HGB 7.0 (L) 11/12/2023   HCT 20.8 (L) 11/12/2023   MCV 90.0 11/12/2023   PLT 336 11/12/2023      Component Value Date/Time   NA 141  11/12/2023 0518   NA 144 04/30/2022 0820   K 4.2 11/12/2023 0518   CL 106 11/12/2023 0518   CO2 29 11/12/2023 0518   GLUCOSE 93 11/12/2023 0518   BUN 10 11/12/2023 0518   BUN 24 04/30/2022 0820   CREATININE 0.99 11/12/2023 0518   CALCIUM  8.9 11/12/2023 0518   PROT 6.2 (L) 11/09/2023 0542   ALBUMIN 3.0 (L) 11/09/2023 0542   AST 25 11/09/2023 0542   ALT 15 11/09/2023 0542   ALKPHOS 116 11/09/2023 0542   BILITOT 0.6 11/09/2023 0542   GFRNONAA 59 (L) 11/12/2023 0518   GFRAA >60 06/04/2019 0413   Lab Results  Component Value Date   CHOL 128 04/30/2022   HDL 65 04/30/2022   LDLCALC 42 04/30/2022   TRIG 117 04/30/2022   CHOLHDL 2.0 04/30/2022   Lab Results  Component Value Date   HGBA1C 5.0 05/21/2019   Lab Results  Component Value Date   VITAMINB12 1,740 (H) 11/08/2023   Lab Results  Component Value Date   TSH 2.950 04/30/2022     ASSESSMENT AND PLAN 78 y.o. year old female  has a past medical history of Anemia, Arthritis, Bulging lumbar disc, Cancer (HCC), [name] (HCC) (01/13/2015), Degeneration of lumbar or lumbosacral intervertebral disc (01/21/2015), Essential hypertension (01/21/2015), GERD (gastroesophageal reflux  disease) (01/21/2015), Heart murmur, History of excision of intestinal structure (10/18/2023), Hyperlipidemia (01/21/2015), Hypertension, Pre-diabetes, S/P total colectomy (05/29/2019), and Ulcerative colitis (HCC). here with   No diagnosis found.    Jay Kempe Bluett is doing fairly well on CPAP therapy. Compliance report reveals acceptable compliance. She has discussed mask options with DME and waiting for new shipment. She was encouraged to continue using CPAP nightly and for greater than 4 hours each night. We will update supply orders as indicated. Risks of untreated sleep apnea review and education materials provided. Healthy lifestyle habits encouraged. She will follow up in 6 months, sooner if needed. She verbalizes understanding and  agreement with this plan.    No orders of the defined types were placed in this encounter.    No orders of the defined types were placed in this encounter.     Greig Forbes, FNP-C 12/03/2023, 10:56 AM [facility] [address] [place], [place] [address] [phone]

## 2023-12-03 NOTE — H&P (Incomplete)
 History and Physical  Donna [name] Donna [name]:[id number] DOB: [birth year] DOA: 12/03/2023  PCP: Clarice Nottingham, MD   Chief Complaint: Post Op problem  HPI: Donna [name] is a 78 y.o. female with medical history significant for ulcerative colitis, colon cancer s/p colectomy with ileostomy, CKD-3A, GERD, DDD, OSA, incarcerated abdominal wall hernia s/p surgery/repair on 10/18/23 who presented to the ED for evaluation of pain and drainage around her abdominal incision site.  Patient recently admitted from 11/7 - 11/11 for AKI, possible abdominal abscess around colectomy, underwent IR aspiration on 11/8 with negative cultures. Fluid collections thought to be probable seroma, antibiotics discontinued.  Patient reports she has been having a lot of purulent drainage and pain around the surgical site since Saturday. Her home health nurse called her general surgeon and patient was advised to present to the ED for evaluation.  She reports good output from the ileostomy.  She endorsed chronic leg swelling but denies any fevers, chills, nausea, vomiting, chest pain, shortness of breath, dysuria or dizziness.  ED Course: Initial vitals show patient afebrile, SBP 110-130s. Initial labs significant for creatinine 1.32, alk phos 133, WBC 10.1, Hgb 8.0, platelet 451, normal lipase and UA with no signs of infection.  CT A/P shows multiple loculated fluid collections surrounding the ileostomy with increased punctate gas, suggestive of superinfection and/or enteric communication, and relative hyperdensity suggestive of hemorrhagic fluid. Pt received IV Zosyn  and IV fentanyl .  General surgery (Dr. Ebbie) was consulted for evaluation. [facility] was consulted for admission.   Review of Systems: Please see HPI for pertinent positives and negatives. A complete 10 system review of systems are otherwise negative.  Past Medical History:  Diagnosis Date   Anemia    Arthritis    Bulging lumbar disc    Cancer (HCC)     colon   [name] (HCC) 01/13/2015   Degeneration of lumbar or lumbosacral intervertebral disc 01/21/2015   Essential hypertension 01/21/2015   GERD (gastroesophageal reflux disease) 01/21/2015   Heart murmur    ECHO 05-17-2023   History of excision of intestinal structure 10/18/2023   Hyperlipidemia 01/21/2015   Hypertension    Pre-diabetes    diet   S/P total colectomy 05/29/2019   Ulcerative colitis [facility])    Past Surgical History:  Procedure Laterality Date   BIOPSY  06/29/2022   Procedure: BIOPSY;  Surgeon: Teresa Lonni HERO, MD;  Location: [facility];  Service: General;;   CESAREAN SECTION     COLON SURGERY  2015   for twisted bowel   ERCP N/A 07/10/2023   Procedure: ERCP, WITH INTERVENTION IF INDICATED;  Surgeon: Rosalie Kitchens, MD;  Location: [facility];  Service: Gastroenterology;  Laterality: N/A;   EYE SURGERY Right    cataract extraction   FLEXIBLE SIGMOIDOSCOPY N/A 05/18/2020   Procedure: SURVEILLANCE FLEXIBLE SIGMOIDOSCOPY;  Surgeon: Teresa Lonni HERO, MD;  Location: [facility];  Service: General;  Laterality: N/A;   FLEXIBLE SIGMOIDOSCOPY N/A 06/29/2022   Procedure: FLEXIBLE SIGMOIDOSCOPY;  Surgeon: Teresa Lonni HERO, MD;  Location: [facility];  Service: General;  Laterality: N/A;   FLEXIBLE SIGMOIDOSCOPY Left 08/29/2023   Procedure: KINGSTON SIDE;  Surgeon: Teresa Lonni HERO, MD;  Location: [facility];  Service: General;  Laterality: Left;   INCISIONAL HERNIA REPAIR N/A 05/29/2019   Procedure: PRIMARY INCISIONAL HERNIA REPAIR;  Surgeon: Teresa Lonni HERO, MD;  Location: [facility];  Service: General;  Laterality: N/A;   LAPAROTOMY Right 01/12/2015   Procedure: OPEN RIGHT COLECTOMY;  Surgeon: Donnice Ebbie, MD;  Location:  [facility];  Service: General;  Laterality: Right;   RIGHT COLECTOMY  01/12/2015   TOTAL KNEE ARTHROPLASTY Left 06/20/2018   Procedure: TOTAL KNEE ARTHROPLASTY;  Surgeon: Yvone Rush, MD;  Location: [facility];   Service: Orthopedics;  Laterality: Left;   XI ROBOTIC ASSISTED PARASTOMAL HERNIA REPAIR N/A 10/18/2023   Procedure: REPAIR, HERNIA, PARASTOMAL, ROBOT-ASSISTED;  Surgeon: Sheldon Standing, MD;  Location: [facility];  Service: General;  Laterality: N/A;  ROBOTIC PARASTOMAL & INCISIONAL HERNIA REPAIRS WITH MESH   XI ROBOTIC ASSISTED VENTRAL HERNIA N/A 10/18/2023   Procedure: REPAIR, HERNIA, VENTRAL, ROBOT-ASSISTED;  Surgeon: Sheldon Standing, MD;  Location: [facility];  Service: General;  Laterality: N/A;   Social History:  reports that she has never smoked. She has been exposed to tobacco smoke. She has never used smokeless tobacco. She reports that she does not drink alcohol and does not use drugs.  Allergies  Allergen Reactions   Ozempic (0.25 Or 0.5 Mg-Dose) [Semaglutide(0.25 Or 0.5mg -Dos)] Nausea And Vomiting   Ezetimibe Other (See Comments)    Unknown   Gabapentin  Nausea And Vomiting    N/V and headaches   Iodinated Contrast Media Other (See Comments)    Feels faint, coughing, bp elevated   Mesalamine Other (See Comments)    Unknown   Oxycodone  Other (See Comments)    Hallucinations   Prilosec [Omeprazole ] Other (See Comments)    Made legs hurt, hair fell out, and nauseated.    Statins Other (See Comments)    Muscle spasms     Family History  Problem Relation Age of Onset   Diabetes Mother    Ulcerative colitis Father    Heart failure Sister    Sleep apnea Son      Prior to Admission medications   Medication Sig Start Date End Date Taking? Authorizing Provider  acetaminophen  (TYLENOL ) 500 MG tablet Take 1,000 mg by mouth every 6 (six) hours as needed for mild pain or headache.    [provider]  amLODipine  (NORVASC ) 10 MG tablet Take 10 mg by mouth at bedtime. 05/20/18   [provider]  aspirin  EC 81 MG tablet Take 1 tablet (81 mg total) by mouth daily. Swallow whole. 03/20/21   Patwardhan, Newman PARAS, MD  B Complex Vitamins (B COMPLEX 100 PO) Take 1 tablet by mouth  daily.    [provider]  benazepril  (LOTENSIN ) 40 MG tablet Take 40 mg by mouth at bedtime. 05/14/18   [provider]  calcium  carbonate (OS-CAL - DOSED IN MG OF ELEMENTAL CALCIUM ) 1250 (500 Ca) MG tablet Take 2 tablets by mouth 2 (two) times daily with a meal.    [provider]  cyclobenzaprine  (FLEXERIL ) 5 MG tablet Take 1 tablet (5 mg total) by mouth 3 (three) times daily as needed for muscle spasms. 10/18/23   Sheldon Standing, MD  Evolocumab  (REPATHA  SURECLICK) 140 MG/ML SOAJ Inject 140 mg (1 pen) into the skin every 14 days 07/08/23   Patwardhan, Newman PARAS, MD  famotidine  (PEPCID ) 20 MG tablet Take 20 mg by mouth daily.    [provider]  hydrochlorothiazide  (HYDRODIURIL ) 25 MG tablet Take 25 mg by mouth daily.    [provider]  labetalol  (NORMODYNE ) 100 MG tablet TAKE 1 TABLET TWICE DAILY (NEED TO KEEP UPCOMING APPOINTMENT IN APRIL TO PREVENT ANY MISSED DOSES) Patient taking differently: Take 100 mg by mouth 2 (two) times daily. 05/03/23   Patwardhan, Newman PARAS, MD  latanoprost  (XALATAN ) 0.005 % ophthalmic solution Place 1 drop into  both eyes at bedtime. 05/05/22   [provider]  loperamide  (IMODIUM ) 2 MG capsule Take 1 capsule (2 mg total) by mouth 2 (two) times daily. Patient taking differently: Take 4 mg by mouth in the morning, at noon, and at bedtime. 06/04/19   Teresa Lonni HERO, MD  Menthol -Methyl Salicylate (MUSCLE RUB) 10-15 % CREA Apply 1 application topically 2 (two) times daily as needed for muscle pain.    [provider]  metoCLOPramide  (REGLAN ) 5 MG tablet Take 1 tablet (5 mg total) by mouth every 8 (eight) hours as needed for nausea or vomiting. 10/31/23   Sheldon Standing, MD  Multiple Vitamin (MULTIVITAMIN WITH MINERALS) TABS tablet Take 1 tablet by mouth daily. Woman's one a day    [provider]  Multiple Vitamins-Iron  (MULTIVITAMINS WITH IRON ) TABS tablet Take 1 tablet by mouth daily. 11/13/23   Patsy Lenis, MD  pantoprazole  (PROTONIX ) 40 MG tablet Take 40 mg by mouth 2 (two) times daily before a meal.     [provider]  polycarbophil (FIBERCON) 625 MG tablet Take 1 tablet (625 mg total) by mouth 2 (two) times daily. 11/12/23 12/12/23  Patsy Lenis, MD  psyllium (HYDROCIL/METAMUCIL) 95 % PACK Take 1 packet by mouth daily. 10/30/23   Sheldon Standing, MD  timolol  (TIMOPTIC ) 0.5 % ophthalmic solution Place 1 drop into both eyes every morning. 06/17/22   [provider]  traMADol  (ULTRAM ) 50 MG tablet Take 1 tablet (50 mg total) by mouth every 6 (six) hours as needed for moderate pain (pain score 4-6) or severe pain (pain score 7-10). 11/12/23   Patsy Lenis, MD  traZODone  (DESYREL ) 50 MG tablet Take 50 mg by mouth at bedtime. 02/11/23   [provider]    Physical Exam: BP (!) 132/52 (BP Location: Left Arm)   Pulse 82   Temp 99.1 F (37.3 C) (Oral)   Resp 16   Ht 5' 3 (1.6 m)   Wt 75.3 kg   SpO2 100%   BMI 29.41 kg/m  General: Pleasant, well-appearing elderly woman laying in bed. No acute distress. HEENT: [place]/AT. Anicteric sclera CV: RRR. No murmurs, rubs, or gallops. 1+ BLE edema Pulmonary: Lungs CTAB. Normal effort. No wheezing or rales. Abdominal: Soft, nondistended. Moderate tenderness to palpation around the surgical site, wound covered with dressing.  Ileostomy bag stable in RUQ with brownish stool. Normal bowel sounds. Extremities: Palpable radial and DP pulses. Normal ROM. Skin: Warm and dry. No obvious rash or lesions. Neuro: A&Ox3. Moves all extremities. Normal sensation to light touch. No focal deficit. Psych: Normal mood and affect          Labs on Admission:  Basic Metabolic Panel: Recent Labs  Lab 12/03/23 1646  NA 136  K 3.6  CL 100  CO2 23  GLUCOSE 127*  BUN 21  CREATININE 1.32*  CALCIUM  9.2   Liver Function Tests: Recent Labs  Lab 12/03/23 1646  AST 29  ALT 23  ALKPHOS 133*  BILITOT 0.4  PROT 6.8  ALBUMIN 3.2*    Recent Labs  Lab 12/03/23 1646  LIPASE 20   No results for input(s): AMMONIA in the last 168 hours. CBC: Recent Labs  Lab 12/03/23 1646  WBC 10.1  HGB 8.0*  HCT 22.8*  MCV 84.4  PLT 451*   Cardiac Enzymes: No results for input(s): CKTOTAL, CKMB, CKMBINDEX, TROPONINI in the last 168 hours. BNP (last 3 results) No results for input(s): BNP in the last 8760 hours.  ProBNP (last 3  results) No results for input(s): PROBNP in the last 8760 hours.  CBG: No results for input(s): GLUCAP in the last 168 hours.  Radiological Exams on Admission: CT ABDOMEN PELVIS WO CONTRAST Result Date: 12/03/2023 EXAM: CT ABDOMEN AND PELVIS WITHOUT CONTRAST 12/03/2023 09:44:19 PM TECHNIQUE: CT of the abdomen and pelvis was performed without the administration of intravenous contrast. Multiplanar reformatted images are provided for review. Automated exposure control, iterative reconstruction, and/or weight-based adjustment of the mA/kV was utilized to reduce the radiation dose to as low as reasonably achievable. COMPARISON: Prior examination of 11/08/2023. Remote prior examination of 01/12/2015. CLINICAL HISTORY: Abdominal pain, acute, nonlocalized; upper abd pain around colostomy site, drainage, hx of prior abscess. FINDINGS: LOWER CHEST: Scattered bibasilar ground glass opacities have developed, likely reflecting atelectasis. Extensive coronary artery calcifications. Clinical cardiac size within normal limits. Hypoattenuation of the cardiac blood pool in keeping with at least moderate anemia. LIVER: Pneumovila again noted within the liver suggesting changes of prior sphincterotomy. No intra- or extrahepatic biliary ductal dilation. Liver otherwise unremarkable. GALLBLADDER AND BILE DUCTS: Cholelithiasis without superimposed pericholecystic inflammatory change. No biliary ductal dilatation. SPLEEN: No acute abnormality. PANCREAS: No acute abnormality. ADRENAL GLANDS: Stable 18 mm nodule within  the right adrenal gland, likely an adrenal adenoma and minimally enlarged when compared to remote prior examination of 01/12/2015, where this measured 16 mm. The left adrenal gland is unremarkable. KIDNEYS, URETERS AND BLADDER: Kidneys were unremarkable. Bladder unremarkable. No stones in the kidneys or ureters. No hydronephrosis. No perinephric or periureteral stranding. GI AND BOWEL: Surgical changes of proctocolectomy and right lower quadrant end ileostomy are identified. No evidence of obstruction. Stomach demonstrates no acute abnormality. PERITONEUM AND RETROPERITONEUM: No free intraperitoneal gas or fluid. VASCULATURE: Aorta is normal in caliber. No aortic aneurysm. LYMPH NODES: No lymphadenopathy. REPRODUCTIVE ORGANS: No acute abnormality. BONES AND SOFT TISSUES: Severe lumbar levoscoliosis again noted. No acute bone abnormality. Since the prior examination, the lobulated fluid collection surrounding the ileostomy has enlarged, now measuring up to 5.7 x 12.5 cm within the subcutaneous soft tissues surrounding the terminal ileum and, superiorly, measuring 2.3 x 14.1 cm subjacent to the right upper quadrant anterior abdominal wall. Both these collections appear enlarged since the prior examination. There is, additionally, increasing punctate gas seen within this collections suggesting superinfection and / or enteric communication. There is relative hyperdensity involving several components of this fluid collection suggest that this may be in part related to hemorrhagic fluid. Lobulated fluid appears to track into the subcutaneous soft tissues of the right lateral abdominal wall through the abdominal wall musculature which may reflect dehiscence of a laparoscopic port incision. IMPRESSION: 1. Multiple lobulated fluid collections surrounding the ileostomy with increasing punctate gas, suggestive of superinfection and/or enteric communication, and relative hyperdensity suggestive of hemorrhagic fluid. These  collections have enlarged since the prior examination. 2. Surgical changes of total proctocolectomy and right lower quadrant end ileostomy. 3. Cholelithiasis without superimposed pericholecystic inflammatory change. Electronically signed by: Dorethia Molt MD 12/03/2023 10:17 PM EST RP Workstation: [id number]   Assessment/Plan Marlin Brys Hewins is a 79 y.o. female with medical history significant for ulcerative colitis, colon cancer s/p colectomy with ileostomy, CKD-3A, GERD, DDD, OSA, incarcerated abdominal wall hernia s/p surgery/repair on 10/18/23 who presented to the ED for evaluation of pain and drainage around her abdominal incision site and admitted for intra-abdominal infection.  # Intra-abdominal infection # Hx of incarcerated abdominal wall hernia s/p repair - Patient has had multiple hospitalizations over the last few months for complications relating to  her recent hernia repair - She is now presented with increased drainage and pain around the surgical site - Abdominal imaging shows multiple loculated fluid collections surrounding the ileostomy with increasing punctate gas, suggestive of superinfection and/or enteric communication and relative hyperdensity suggestive of hemorrhagic fluid with collections that have enlarged since prior examination. - General Surgery, consulted for evaluation - Continue IV Zosyn  - IV Dilaudid  as needed for pain - Trend CBC, fever curve  # CKD 3A - Creatinine of 1.32 not significantly different from recent baseline but slightly worse compared to 0.99 3 weeks ago - Start IV NS 100 cc/h for 1 day - Trend renal function and avoid nephrotoxic agents  # Hypertension - BP stable with SBP in the 110-130s - Continue labetalol , HCTZ, benazepril  and amlodipine   # Colon cancer s/p colectomy with ileostomy - Reports good output from ileostomy bag - Continue loperamide  and polycarbophil  # GERD - Continue Protonix  and famotidine   # Chronic leg  swelling - Patient with 1+ BLE edema on exam but is unchanged - Apply TED hose  # Generalized weakness - In the setting of acute illness - PT eval and treat  DVT prophylaxis: Lovenox , TED hose    Code Status: Full Code  Consults called: General Surgery  Family Communication: No family at bedside  Severity of Illness: The appropriate patient status for this patient is INPATIENT. Inpatient status is judged to be reasonable and necessary in order to provide the required intensity of service to ensure the patient's safety. The patient's presenting symptoms, physical exam findings, and initial radiographic and laboratory data in the context of their chronic comorbidities is felt to place them at high risk for further clinical deterioration. Furthermore, it is not anticipated that the patient will be medically stable for discharge from the hospital within 2 midnights of admission.   * I certify that at the point of admission it is my clinical judgment that the patient will require inpatient hospital care spanning beyond 2 midnights from the point of admission due to high intensity of service, high risk for further deterioration and high frequency of surveillance required.*  Level of care: Med-Surg    Lou Claretta HERO, MD 12/04/2023, 2:53 AM [facility] Pager: [phone] Isaiah 41:10   If 7PM-7AM, please contact night-coverage [email or web address] Password [email or web address]

## 2023-12-03 NOTE — ED Provider Notes (Signed)
 [place] EMERGENCY DEPARTMENT AT [facility] Provider Note   CSN: [id number] Arrival date & time: 12/03/23  8371     Patient presents with: Post-op Problem   Donna [name] is a 78 y.o. female.   Patient is a 78 year old who presents with abdominal pain and drainage around her recent abdominal surgery.  She has a history of ulcerative colitis and colon cancer status post colectomy.  She was admitted in October for a para ileostomy hernia status post robotic repair.  She was readmitted from November 7 to November 11 for possible abscess around the surgical area.  She underwent aspiration on November 8 that did not grow any bacteria.  It was felt to be a probable seroma.  Antibiotics were discontinued.  She said she has had increased swelling around the area over the last couple days as well as increased pain.  She has noted some yellow drainage from the site.  She denies any fevers.  She is having good output from her ileostomy.  No nausea or vomiting.       Prior to Admission medications   Medication Sig Start Date End Date Taking? Authorizing Provider  acetaminophen  (TYLENOL ) 500 MG tablet Take 1,000 mg by mouth every 6 (six) hours as needed for mild pain or headache.    [provider]  amLODipine  (NORVASC ) 10 MG tablet Take 10 mg by mouth at bedtime. 05/20/18   [provider]  aspirin  EC 81 MG tablet Take 1 tablet (81 mg total) by mouth daily. Swallow whole. 03/20/21   Patwardhan, Newman PARAS, MD  B Complex Vitamins (B COMPLEX 100 PO) Take 1 tablet by mouth daily.    [provider]  benazepril  (LOTENSIN ) 40 MG tablet Take 40 mg by mouth at bedtime. 05/14/18   [provider]  calcium  carbonate (OS-CAL - DOSED IN MG OF ELEMENTAL CALCIUM ) 1250 (500 Ca) MG tablet Take 2 tablets by mouth 2 (two) times daily with a meal.    [provider]  cyclobenzaprine  (FLEXERIL ) 5 MG tablet Take 1 tablet (5 mg total) by mouth 3 (three) times  daily as needed for muscle spasms. 10/18/23   Sheldon Standing, MD  Evolocumab  (REPATHA  SURECLICK) 140 MG/ML SOAJ Inject 140 mg (1 pen) into the skin every 14 days 07/08/23   Patwardhan, Newman PARAS, MD  famotidine  (PEPCID ) 20 MG tablet Take 20 mg by mouth daily.    [provider]  hydrochlorothiazide  (HYDRODIURIL ) 25 MG tablet Take 25 mg by mouth daily.    [provider]  labetalol  (NORMODYNE ) 100 MG tablet TAKE 1 TABLET TWICE DAILY (NEED TO KEEP UPCOMING APPOINTMENT IN APRIL TO PREVENT ANY MISSED DOSES) Patient taking differently: Take 100 mg by mouth 2 (two) times daily. 05/03/23   Patwardhan, Newman PARAS, MD  latanoprost  (XALATAN ) 0.005 % ophthalmic solution Place 1 drop into both eyes at bedtime. 05/05/22   [provider]  loperamide  (IMODIUM ) 2 MG capsule Take 1 capsule (2 mg total) by mouth 2 (two) times daily. Patient taking differently: Take 4 mg by mouth in the morning, at noon, and at bedtime. 06/04/19   Teresa Lonni HERO, MD  Menthol -Methyl Salicylate (MUSCLE RUB) 10-15 % CREA Apply 1 application topically 2 (two) times daily as needed for muscle pain.    [provider]  metoCLOPramide  (REGLAN ) 5 MG tablet Take 1 tablet (5 mg total) by mouth every 8 (eight) hours as needed for nausea or vomiting. 10/31/23   Sheldon Standing, MD  Multiple Vitamin (  MULTIVITAMIN WITH MINERALS) TABS tablet Take 1 tablet by mouth daily. Woman's one a day    [provider]  Multiple Vitamins-Iron  (MULTIVITAMINS WITH IRON ) TABS tablet Take 1 tablet by mouth daily. 11/13/23   Patsy Lenis, MD  pantoprazole  (PROTONIX ) 40 MG tablet Take 40 mg by mouth 2 (two) times daily before a meal.     [provider]  polycarbophil (FIBERCON) 625 MG tablet Take 1 tablet (625 mg total) by mouth 2 (two) times daily. 11/12/23 12/12/23  Patsy Lenis, MD  psyllium (HYDROCIL/METAMUCIL) 95 % PACK Take 1 packet by mouth daily. 10/30/23   Sheldon Standing, MD  timolol  (TIMOPTIC ) 0.5 %  ophthalmic solution Place 1 drop into both eyes every morning. 06/17/22   [provider]  traMADol  (ULTRAM ) 50 MG tablet Take 1 tablet (50 mg total) by mouth every 6 (six) hours as needed for moderate pain (pain score 4-6) or severe pain (pain score 7-10). 11/12/23   Patsy Lenis, MD  traZODone  (DESYREL ) 50 MG tablet Take 50 mg by mouth at bedtime. 02/11/23   [provider]    Allergies: Ozempic (0.25 or 0.5 mg-dose) [semaglutide(0.25 or 0.5mg -dos)], Ezetimibe, Gabapentin , Iodinated contrast media, Mesalamine, Oxycodone , Prilosec [omeprazole ], and Statins    Review of Systems  Constitutional:  Negative for chills, diaphoresis, fatigue and fever.  HENT:  Negative for congestion, rhinorrhea and sneezing.   Eyes: Negative.   Respiratory:  Negative for cough, chest tightness and shortness of breath.   Cardiovascular:  Negative for chest pain and leg swelling.  Gastrointestinal:  Positive for abdominal pain. Negative for blood in stool, diarrhea, nausea and vomiting.  Genitourinary:  Negative for difficulty urinating, flank pain and frequency.  Musculoskeletal:  Negative for arthralgias and back pain.  Skin:  Negative for rash.  Neurological:  Negative for dizziness, speech difficulty, weakness, numbness and headaches.    Updated Vital Signs BP (!) 135/47   Pulse 86   Temp 99.1 F (37.3 C) (Oral)   Resp 18   Ht 5' 3 (1.6 m)   Wt 75.3 kg   SpO2 94%   BMI 29.41 kg/m   Physical Exam Constitutional:      Appearance: She is well-developed.  HENT:     Head: Normocephalic and atraumatic.  Eyes:     Pupils: Pupils are equal, round, and reactive to light.  Cardiovascular:     Rate and Rhythm: Normal rate and regular rhythm.     Heart sounds: Normal heart sounds.  Pulmonary:     Effort: Pulmonary effort is normal. No respiratory distress.     Breath sounds: Normal breath sounds. No wheezing or rales.  Chest:     Chest wall: No tenderness.  Abdominal:     General:  Bowel sounds are normal.     Palpations: Abdomen is soft.     Tenderness: There is no abdominal tenderness. There is no guarding or rebound.     Comments: Ileostomy bag in place in her right mid abdomen.  There is generalized tenderness around the area.  There is round stool in the bag.  Musculoskeletal:        General: Normal range of motion.     Cervical back: Normal range of motion and neck supple.  Lymphadenopathy:     Cervical: No cervical adenopathy.  Skin:    General: Skin is warm and dry.     Findings: No rash.  Neurological:     Mental Status: She is alert and oriented to person, place, and  time.     (all labs ordered are listed, but only abnormal results are displayed) Labs Reviewed  COMPREHENSIVE METABOLIC PANEL WITH GFR - Abnormal; Notable for the following components:      Result Value   Glucose, Bld 127 (*)    Creatinine, Ser 1.32 (*)    Albumin 3.2 (*)    Alkaline Phosphatase 133 (*)    GFR, Estimated 42 (*)    All other components within normal limits  CBC - Abnormal; Notable for the following components:   RBC 2.70 (*)    Hemoglobin 8.0 (*)    HCT 22.8 (*)    RDW 16.5 (*)    Platelets 451 (*)    All other components within normal limits  URINALYSIS, ROUTINE W REFLEX MICROSCOPIC - Abnormal; Notable for the following components:   APPearance HAZY (*)    All other components within normal limits  LIPASE, BLOOD    EKG: None  Radiology: CT ABDOMEN PELVIS WO CONTRAST Result Date: 12/03/2023 EXAM: CT ABDOMEN AND PELVIS WITHOUT CONTRAST 12/03/2023 09:44:19 PM TECHNIQUE: CT of the abdomen and pelvis was performed without the administration of intravenous contrast. Multiplanar reformatted images are provided for review. Automated exposure control, iterative reconstruction, and/or weight-based adjustment of the mA/kV was utilized to reduce the radiation dose to as low as reasonably achievable. COMPARISON: Prior examination of 11/08/2023. Remote prior examination of  01/12/2015. CLINICAL HISTORY: Abdominal pain, acute, nonlocalized; upper abd pain around colostomy site, drainage, hx of prior abscess. FINDINGS: LOWER CHEST: Scattered bibasilar ground glass opacities have developed, likely reflecting atelectasis. Extensive coronary artery calcifications. Clinical cardiac size within normal limits. Hypoattenuation of the cardiac blood pool in keeping with at least moderate anemia. LIVER: Pneumovila again noted within the liver suggesting changes of prior sphincterotomy. No intra- or extrahepatic biliary ductal dilation. Liver otherwise unremarkable. GALLBLADDER AND BILE DUCTS: Cholelithiasis without superimposed pericholecystic inflammatory change. No biliary ductal dilatation. SPLEEN: No acute abnormality. PANCREAS: No acute abnormality. ADRENAL GLANDS: Stable 18 mm nodule within the right adrenal gland, likely an adrenal adenoma and minimally enlarged when compared to remote prior examination of 01/12/2015, where this measured 16 mm. The left adrenal gland is unremarkable. KIDNEYS, URETERS AND BLADDER: Kidneys were unremarkable. Bladder unremarkable. No stones in the kidneys or ureters. No hydronephrosis. No perinephric or periureteral stranding. GI AND BOWEL: Surgical changes of proctocolectomy and right lower quadrant end ileostomy are identified. No evidence of obstruction. Stomach demonstrates no acute abnormality. PERITONEUM AND RETROPERITONEUM: No free intraperitoneal gas or fluid. VASCULATURE: Aorta is normal in caliber. No aortic aneurysm. LYMPH NODES: No lymphadenopathy. REPRODUCTIVE ORGANS: No acute abnormality. BONES AND SOFT TISSUES: Severe lumbar levoscoliosis again noted. No acute bone abnormality. Since the prior examination, the lobulated fluid collection surrounding the ileostomy has enlarged, now measuring up to 5.7 x 12.5 cm within the subcutaneous soft tissues surrounding the terminal ileum and, superiorly, measuring 2.3 x 14.1 cm subjacent to the right upper  quadrant anterior abdominal wall. Both these collections appear enlarged since the prior examination. There is, additionally, increasing punctate gas seen within this collections suggesting superinfection and / or enteric communication. There is relative hyperdensity involving several components of this fluid collection suggest that this may be in part related to hemorrhagic fluid. Lobulated fluid appears to track into the subcutaneous soft tissues of the right lateral abdominal wall through the abdominal wall musculature which may reflect dehiscence of a laparoscopic port incision. IMPRESSION: 1. Multiple lobulated fluid collections surrounding the ileostomy with increasing punctate gas, suggestive of  superinfection and/or enteric communication, and relative hyperdensity suggestive of hemorrhagic fluid. These collections have enlarged since the prior examination. 2. Surgical changes of total proctocolectomy and right lower quadrant end ileostomy. 3. Cholelithiasis without superimposed pericholecystic inflammatory change. Electronically signed by: Dorethia Molt MD 12/03/2023 10:17 PM EST RP Workstation: [id number]     Procedures   Medications Ordered in the ED  piperacillin -tazobactam (ZOSYN ) IVPB 3.375 g (has no administration in time range)  fentaNYL  (SUBLIMAZE ) injection 50 mcg (50 mcg Intravenous Given 12/03/23 2044)                                    Medical Decision Making Amount and/or Complexity of Data Reviewed Labs: ordered. Radiology: ordered.  Risk Prescription drug management.   This patient presents to the ED for concern of abdominal pain, swelling, this involves an extensive number of treatment options, and is a complaint that carries with it a high risk of complications and morbidity.  I considered the following differential and admission for this acute, potentially life threatening condition.  The differential diagnosis includes cellulitis, abscess, obstruction, hernia  MDM:     Patient is a 78 year old who has had complex abdominal history with recent para ileostomy hernia status postrepair and seroma collection.  She presents with worsening pain and swelling around her ileostomy at the site of her prior hernia repair.  She is not febrile.  Her labs here nonconcerning.  CT scan shows large fluid collections around the site.  I discussed this with Dr. Ebbie.  He recommends starting patient on antibiotics and admitting to the hospitalist service.  They will consult on the patient in the morning.  She was given IV Zosyn .  Discussed with the hospitalist who will admit the pt.  (Labs, imaging, consults)  Labs: I Ordered, and personally interpreted labs.  The pertinent results include: Normal WBC count, anemia but stable from prior values  Imaging Studies ordered: I ordered imaging studies including CT abdomen pelvis I independently visualized and interpreted imaging. I agree with the radiologist interpretation  Additional history obtained from Member at bedside.  External records from outside source obtained and reviewed including history  Cardiac Monitoring: The patient was maintained on a cardiac monitor.  If on the cardiac monitor, I personally viewed and interpreted the cardiac monitored which showed an underlying rhythm of: Sinus rhythm  Reevaluation: After the interventions noted above, I reevaluated the patient and found that they have :improved  Social Determinants of Health:    Disposition: Admit to hospital  Co morbidities that complicate the patient evaluation  Past Medical History:  Diagnosis Date   Anemia    Arthritis    Bulging lumbar disc    Cancer (HCC)    colon   [name] (HCC) 01/13/2015   Degeneration of lumbar or lumbosacral intervertebral disc 01/21/2015   Essential hypertension 01/21/2015   GERD (gastroesophageal reflux disease) 01/21/2015   Heart murmur    ECHO 05-17-2023   History of excision of intestinal structure  10/18/2023   Hyperlipidemia 01/21/2015   Hypertension    Pre-diabetes    diet   S/P total colectomy 05/29/2019   Ulcerative colitis (HCC)      Medicines Meds ordered this encounter  Medications   fentaNYL  (SUBLIMAZE ) injection 50 mcg   piperacillin -tazobactam (ZOSYN ) IVPB 3.375 g    Antibiotic Indication::   Intra-abdominal Infection    I have reviewed the patients home medicines and have  made adjustments as needed  Problem List / ED Course: Problem List Items Addressed This Visit   None            Final diagnoses:  None    ED Discharge Orders     None          Lenor Hollering, MD 12/03/23 2307

## 2023-12-04 ENCOUNTER — Ambulatory Visit: Admitting: Family Medicine

## 2023-12-04 ENCOUNTER — Other Ambulatory Visit: Payer: Self-pay

## 2023-12-04 ENCOUNTER — Inpatient Hospital Stay (HOSPITAL_COMMUNITY)

## 2023-12-04 ENCOUNTER — Encounter: Payer: Self-pay | Admitting: Family Medicine

## 2023-12-04 DIAGNOSIS — I10 Essential (primary) hypertension: Secondary | ICD-10-CM

## 2023-12-04 DIAGNOSIS — B999 Unspecified infectious disease: Secondary | ICD-10-CM

## 2023-12-04 DIAGNOSIS — G4733 Obstructive sleep apnea (adult) (pediatric): Secondary | ICD-10-CM

## 2023-12-04 DIAGNOSIS — E860 Dehydration: Secondary | ICD-10-CM | POA: Diagnosis not present

## 2023-12-04 DIAGNOSIS — Z9889 Other specified postprocedural states: Secondary | ICD-10-CM

## 2023-12-04 DIAGNOSIS — Z8719 Personal history of other diseases of the digestive system: Secondary | ICD-10-CM

## 2023-12-04 DIAGNOSIS — N1831 Chronic kidney disease, stage 3a: Secondary | ICD-10-CM | POA: Diagnosis not present

## 2023-12-04 DIAGNOSIS — Z932 Ileostomy status: Secondary | ICD-10-CM | POA: Diagnosis not present

## 2023-12-04 DIAGNOSIS — K635 Polyp of colon: Secondary | ICD-10-CM | POA: Diagnosis not present

## 2023-12-04 DIAGNOSIS — D649 Anemia, unspecified: Secondary | ICD-10-CM

## 2023-12-04 DIAGNOSIS — K219 Gastro-esophageal reflux disease without esophagitis: Secondary | ICD-10-CM

## 2023-12-04 DIAGNOSIS — R531 Weakness: Secondary | ICD-10-CM

## 2023-12-04 HISTORY — PX: IR US GUIDE BX ASP/DRAIN: IMG2392

## 2023-12-04 LAB — BASIC METABOLIC PANEL WITH GFR
Anion gap: 11 (ref 5–15)
BUN: 16 mg/dL (ref 8–23)
CO2: 25 mmol/L (ref 22–32)
Calcium: 8.8 mg/dL — ABNORMAL LOW (ref 8.9–10.3)
Chloride: 105 mmol/L (ref 98–111)
Creatinine, Ser: 1.07 mg/dL — ABNORMAL HIGH (ref 0.44–1.00)
GFR, Estimated: 54 mL/min — ABNORMAL LOW (ref >60)
Glucose, Bld: 98 mg/dL (ref 70–99)
Potassium: 3.5 mmol/L (ref 3.5–5.1)
Sodium: 141 mmol/L (ref 135–145)

## 2023-12-04 LAB — CBC
HCT: 21.8 % — ABNORMAL LOW (ref 36.0–46.0)
Hemoglobin: 7.4 g/dL — ABNORMAL LOW (ref 12.0–15.0)
MCH: 29.4 pg (ref 26.0–34.0)
MCHC: 33.9 g/dL (ref 30.0–36.0)
MCV: 86.5 fL (ref 80.0–100.0)
Platelets: 424 K/uL — ABNORMAL HIGH (ref 150–400)
RBC: 2.52 MIL/uL — ABNORMAL LOW (ref 3.87–5.11)
RDW: 16.3 % — ABNORMAL HIGH (ref 11.5–15.5)
WBC: 8.6 K/uL (ref 4.0–10.5)
nRBC: 0 % (ref 0.0–0.2)

## 2023-12-04 LAB — PROTIME-INR
INR: 1 (ref 0.8–1.2)
Prothrombin Time: 14.2 s (ref 11.4–15.2)

## 2023-12-04 MED ORDER — ENOXAPARIN SODIUM 40 MG/0.4ML IJ SOSY: 40.0000 mg | PREFILLED_SYRINGE | INTRAMUSCULAR | Status: DC

## 2023-12-04 MED ORDER — BENAZEPRIL HCL 20 MG PO TABS
40.0000 mg | ORAL_TABLET | Freq: Every day | ORAL | Status: DC
  Administered 2023-12-05 – 2023-12-10 (×6): 40 mg via ORAL
  Filled 2023-12-04 (×6): qty 2

## 2023-12-04 MED ORDER — MIDAZOLAM HCL 2 MG/2ML IJ SOLN
INTRAMUSCULAR | Status: AC
  Filled 2023-12-04: qty 2

## 2023-12-04 MED ORDER — ACETAMINOPHEN 500 MG PO TABS
500.0000 mg | ORAL_TABLET | Freq: Three times a day (TID) | ORAL | Status: DC
  Administered 2023-12-04 – 2023-12-11 (×26): 500 mg via ORAL
  Filled 2023-12-04 (×26): qty 1

## 2023-12-04 MED ORDER — METHOCARBAMOL 500 MG PO TABS
1000.0000 mg | ORAL_TABLET | Freq: Four times a day (QID) | ORAL | Status: DC | PRN
  Filled 2023-12-04: qty 2

## 2023-12-04 MED ORDER — HYDROCHLOROTHIAZIDE 25 MG PO TABS: 25.0000 mg | ORAL_TABLET | Freq: Every day | ORAL | Status: DC

## 2023-12-04 MED ORDER — ASPIRIN 81 MG PO TBEC
81.0000 mg | DELAYED_RELEASE_TABLET | Freq: Every day | ORAL | Status: DC
  Administered 2023-12-05 – 2023-12-11 (×7): 81 mg via ORAL
  Filled 2023-12-04 (×8): qty 1

## 2023-12-04 MED ORDER — MIDAZOLAM HCL (PF) 2 MG/2ML IJ SOLN
INTRAMUSCULAR | Status: AC | PRN
  Administered 2023-12-04 (×2): 1 mg via INTRAVENOUS

## 2023-12-04 MED ORDER — SODIUM CHLORIDE 0.9% FLUSH
10.0000 mL | Freq: Two times a day (BID) | INTRAVENOUS | Status: DC
  Administered 2023-12-04 – 2023-12-11 (×14): 10 mL

## 2023-12-04 MED ORDER — LOPERAMIDE HCL 2 MG PO CAPS: 4.0000 mg | ORAL_CAPSULE | Freq: Three times a day (TID) | ORAL | Status: DC

## 2023-12-04 MED ORDER — FENTANYL CITRATE (PF) 100 MCG/2ML IJ SOLN
INTRAMUSCULAR | Status: AC | PRN
  Administered 2023-12-04 (×2): 50 ug via INTRAVENOUS

## 2023-12-04 MED ORDER — FAMOTIDINE 20 MG PO TABS
20.0000 mg | ORAL_TABLET | Freq: Every day | ORAL | Status: DC
  Administered 2023-12-04 – 2023-12-11 (×8): 20 mg via ORAL
  Filled 2023-12-04 (×8): qty 1

## 2023-12-04 MED ORDER — IRON SUCROSE 200 MG IVPB - SIMPLE MED
200.0000 mg | Freq: Once | Status: AC
  Administered 2023-12-04: 200 mg via INTRAVENOUS
  Filled 2023-12-04: qty 200

## 2023-12-04 MED ORDER — METHOCARBAMOL 1000 MG/10ML IJ SOLN
1000.0000 mg | Freq: Four times a day (QID) | INTRAMUSCULAR | Status: DC | PRN
  Administered 2023-12-05: 1000 mg via INTRAVENOUS
  Filled 2023-12-04: qty 10

## 2023-12-04 MED ORDER — HYDROCODONE-ACETAMINOPHEN 5-325 MG PO TABS
1.0000 | ORAL_TABLET | ORAL | Status: DC | PRN
  Administered 2023-12-04 – 2023-12-11 (×15): 1 via ORAL
  Filled 2023-12-04 (×4): qty 1
  Filled 2023-12-04 (×2): qty 2
  Filled 2023-12-04 (×10): qty 1
  Filled 2023-12-04: qty 2
  Filled 2023-12-04: qty 1

## 2023-12-04 MED ORDER — LABETALOL HCL 100 MG PO TABS
100.0000 mg | ORAL_TABLET | Freq: Two times a day (BID) | ORAL | Status: DC
  Administered 2023-12-04 – 2023-12-11 (×14): 100 mg via ORAL
  Filled 2023-12-04 (×15): qty 1

## 2023-12-04 MED ORDER — LATANOPROST 0.005 % OP SOLN
1.0000 [drp] | Freq: Every day | OPHTHALMIC | Status: DC
  Administered 2023-12-04 – 2023-12-10 (×7): 1 [drp] via OPHTHALMIC
  Filled 2023-12-04: qty 2.5

## 2023-12-04 MED ORDER — SODIUM CHLORIDE 0.9% FLUSH: 10.0000 mL | INTRAVENOUS | Status: DC | PRN

## 2023-12-04 MED ORDER — LOPERAMIDE HCL 2 MG PO CAPS
2.0000 mg | ORAL_CAPSULE | Freq: Three times a day (TID) | ORAL | Status: DC
  Administered 2023-12-04 – 2023-12-11 (×22): 2 mg via ORAL
  Filled 2023-12-04 (×23): qty 1

## 2023-12-04 MED ORDER — FENTANYL CITRATE (PF) 100 MCG/2ML IJ SOLN
INTRAMUSCULAR | Status: AC
  Filled 2023-12-04: qty 2

## 2023-12-04 MED ORDER — LOPERAMIDE HCL 2 MG PO CAPS: 2.0000 mg | ORAL_CAPSULE | Freq: Three times a day (TID) | ORAL | Status: DC | PRN

## 2023-12-04 MED ORDER — TAB-A-VITE/IRON PO TABS
1.0000 | ORAL_TABLET | Freq: Every day | ORAL | Status: DC
  Administered 2023-12-04 – 2023-12-11 (×8): 1 via ORAL
  Filled 2023-12-04 (×8): qty 1

## 2023-12-04 MED ORDER — CALCIUM POLYCARBOPHIL 625 MG PO TABS
625.0000 mg | ORAL_TABLET | Freq: Two times a day (BID) | ORAL | Status: DC
  Administered 2023-12-04 – 2023-12-11 (×15): 625 mg via ORAL
  Filled 2023-12-04 (×15): qty 1

## 2023-12-04 MED ORDER — AMLODIPINE BESYLATE 10 MG PO TABS
10.0000 mg | ORAL_TABLET | Freq: Every day | ORAL | Status: DC
  Administered 2023-12-05 – 2023-12-10 (×6): 10 mg via ORAL
  Filled 2023-12-04 (×7): qty 1

## 2023-12-04 MED ORDER — LIDOCAINE-EPINEPHRINE 1 %-1:100000 IJ SOLN
INTRAMUSCULAR | Status: AC
  Filled 2023-12-04: qty 1

## 2023-12-04 MED ORDER — PANTOPRAZOLE SODIUM 40 MG PO TBEC
40.0000 mg | DELAYED_RELEASE_TABLET | Freq: Two times a day (BID) | ORAL | Status: DC
  Administered 2023-12-04 – 2023-12-11 (×15): 40 mg via ORAL
  Filled 2023-12-04 (×15): qty 1

## 2023-12-04 MED ORDER — TIMOLOL MALEATE 0.5 % OP SOLN
1.0000 [drp] | Freq: Every day | OPHTHALMIC | Status: DC
  Administered 2023-12-04 – 2023-12-11 (×8): 1 [drp] via OPHTHALMIC
  Filled 2023-12-04: qty 5

## 2023-12-04 MED ORDER — SODIUM CHLORIDE 0.9% FLUSH
5.0000 mL | Freq: Three times a day (TID) | INTRAVENOUS | Status: DC
  Administered 2023-12-04 – 2023-12-11 (×20): 5 mL

## 2023-12-04 MED ORDER — ENSURE PLUS HIGH PROTEIN PO LIQD
237.0000 mL | Freq: Two times a day (BID) | ORAL | Status: DC
  Administered 2023-12-05 – 2023-12-10 (×12): 237 mL via ORAL

## 2023-12-04 MED ORDER — LIDOCAINE-EPINEPHRINE 1 %-1:100000 IJ SOLN
20.0000 mL | Freq: Once | INTRAMUSCULAR | Status: AC
  Administered 2023-12-04: 20 mL via INTRADERMAL
  Filled 2023-12-04: qty 20

## 2023-12-04 MED ORDER — CALCIUM CARBONATE 1250 (500 CA) MG PO TABS
2.0000 | ORAL_TABLET | Freq: Two times a day (BID) | ORAL | Status: DC
  Administered 2023-12-04 – 2023-12-11 (×14): 2500 mg via ORAL
  Filled 2023-12-04 (×15): qty 2

## 2023-12-04 MED ORDER — ADULT MULTIVITAMIN W/MINERALS CH: 1.0000 | ORAL_TABLET | Freq: Every day | ORAL | Status: DC

## 2023-12-04 NOTE — Progress Notes (Deleted)
 Donna [name]

## 2023-12-04 NOTE — Progress Notes (Addendum)
 12/04/2023  Donna [name] [id number] 12/07/1945  CARE TEAM: PCP: Clarice Nottingham, MD  Outpatient Care Team: Patient Care Team: Clarice Nottingham, MD as PCP - General (Internal Medicine) Elmira Newman PARAS, MD as PCP - Cardiology (Cardiology) Teresa Lonni HERO, MD as Consulting Physician (Colon and Rectal Surgery) Sheldon Standing, MD as Consulting Physician (General Surgery) Donnald Charleston, MD as Consulting Physician (Gastroenterology) Elmira Newman PARAS, MD as Consulting Physician (Cardiology)  Inpatient Treatment Team: Treatment Team:  Sebastian Toribio GAILS, MD Bobbette File, MD Sheldon Standing, MD Ccs, Md, MD Maralyn Britton DEL, NT [place], Camelia RAMAN, RPH Estelle Hunter DEL, RN [place], Darice BRAVO, PT Verdon Blane MATSU, RN   Problem List:   Principal Problem:   Intra-abdominal infection Active Problems:   Status post ileostomy [facility])   History of hernia repair   CKD stage 3a, GFR 45-59 ml/min (HCC)   Generalized weakness   10/18/2023  POST-OPERATIVE DIAGNOSIS:  PARASTOMAL HERNIA AND VENTRAL INCISIONAL RECURRENT AND INCARCERATED ABDOMINAL WALL HERNIAE  Dimensions of hernia post-op:  16cm x 8cm   PROCEDURE:   ROBOTIC COMPONENT SEPARATION (transversus abdominis release = TAR) on right Repair of incarcerated recurrent incisional and parastomal hernias with mesh ROBOTIC LYSIS OF ADHESIONS SEROSAL REPAIR x2 END ILEOSTOMY REVISION TAP BLOCK - BILATERAL   SURGEON:  Standing KYM Sheldon, MD  OR FINDINGS:  Moderately dense adhesions small bowel to anterior abdominal wall and periumbilical midline hernia.  Swiss cheese hernias extending up to the subxiphoid region.  15 cm in length.  Moderate parastomal hernia around end ileostomy with a foot of small bowel densely adherent to the hernia sac.  Serosal repairs down x 2.  Ileostomy revision done for last 7 cm given poor tissues.   Type of ventral wall repair:  Laparoscopic underlay repair .with Primary repair of largest  hernia Placement of mesh: Right lateral and flank retrorectus preperitoneal.  Midline intraperitoneal. Name of mesh: Bard Ventralight dual sided (polypropylene / Seprafilm) Size of mesh: 33x27cm Orientation: Transverse Mesh overlap:  7cm    Assessment [facility] Stay = 1 days)      New abd wall collections - 6 weeks after parastomal & incisional hernia repairs with mesh.  Plan:  New collection formation rather odd 6 weeks later in old hernia sac & right abd wall.    No fever /inc WBC     Patient feels it was related to increased physical exercise with physical therapy.  She did drop her hemoglobin again?  New hematoma?  There would be risk of abscess but she does not have any fever or leukocytosis or peritonitis.  No change in her bowel function.  Good appetite without nausea / vomiting.  That would argue against a major abdominal catastrophe but I think it would be wise to place drains.  Ask IR to place drains in collections for now.  PA all read evaluating.  Hold enoxaparin  until procedure done  Last fluid collection aspiration abdominal fluid collection 11/8 was consistent with seroma & cultures negative.  R/o delayed ileostomy leak/perforation?    Reasonable to start emperic ABx and regroup.   Low  hemoglobin in the setting of iron  deficiency anemia.    Give IV iron  since doubt good PO intake  No extra anticoagulation for now  There is no evidence of any active GI bleeding.  No hematochezia or hematemesis.    Once drainage done, solid diet.  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  Patient claims it has been well-managed with  fiber and loperamide  twice daily/3 times daily    -monitor electrolytes & replace as needed.  Corrected hypokalemia.  Keep K>4, Mg>2, Phos>3  Hypertension, GERD, sleep apnea, etc. per primary service often uses oxygen at night for sleep apnea..  -VTE prophylaxis- SCDs.  Anticoagulation prophyllaxis SQ as  appropriate  -mobilize as tolerated to help recovery.  Enlist therapies in moderate/high risk patients as appropriate  I updated the patient's status to the patient  Recommendations were made.  Questions were answered.  She expressed understanding & appreciation.  -Disposition: I think she is safe to go from a surgery standpoint as long as medicine agrees and home health has been arranged..     I reviewed nursing notes, last 24 h vitals and pain scores, last 48 h intake and output, last 24 h labs and trends, and last 24 h imaging results.  I have reviewed this patient's available data, including medical history, events of note, test results, etc as part of my evaluation.   A significant portion of that time was spent in counseling. Care during the described time interval was provided by me.  This care required moderate level of medical decision making.  12/04/2023    Subjective: (Chief complaint)  Notified by Dr. Ebbie call last night patient readmitted  Patient notes she was feeling good since I had seen her in the office about 2 weeks ago.  Started doing more exercises with physical therapy.  Felt worsening pain and swelling.  Was concerned.  Call the office.  No nausea vomiting.  No fever or chills.  No diarrhea.  However pain not controlled.  Recommended ER evaluation.  Patient walked in room smiling.  Family in room.  Nursing in room.  Just notes more swelling and pain around her ostomy.  No leaking around the ostomy.  She thought she saw pus drained 1 time.  Ileostomy effluent is brown.  No leaking with the bag.  No fevers or chills.  Objective:  Vital signs:  Vitals:   12/03/23 2031 12/03/23 2314 12/04/23 0034 12/04/23 0550  BP: (!) 135/47 123/63 (!) 132/52 (!) 124/55  Pulse: 86 84 82 78  Resp: 18 16    Temp:  99.1 F (37.3 C) 99.1 F (37.3 C) 99.4 F (37.4 C)  TempSrc:   Oral Oral  SpO2: 94% 97% 100% 100%  Weight:      Height:        Last BM Date :  12/04/23  Intake/Output   Yesterday:  12/02 0701 - 12/03 0700 In: 190.2 [I.V.:141; IV Piggyback:49.2] Out: 60 [Stool:60] This shift:  No intake/output data recorded.  Bowel function:  Flatus: YES  BM:  YES  Drain: (No drain)   Physical Exam:  General: Pt awake/alert in no acute distress.  Walking around the room with no assistance.  Smiling.  Not toxic.  Not sickly.  This is the best time seen her .  Calm.   Eyes: PERRL, normal EOM.  Sclera clear.  No icterus Neuro: CN II-XII intact w/o focal sensory/motor deficits. Lymph: No head/neck/groin lymphadenopathy Psych:  No delerium/psychosis/paranoia.  Oriented x 4 HENT: Normocephalic, Mucus membranes moist.  No thrush Neck: Supple, No tracheal deviation.  No obvious thyromegaly Chest: No pain to chest wall compression.  Good respiratory excursion.  No audible wheezing CV:  Pulses intact.  Regular rhythm.  No major extremity edema MS: Normal AROM mjr joints.  No obvious deformity  Abdomen: Obese.  Swelling supraumbilically and laterally around right upper quadrant correlates with  abdominal wall fluid collections.  No peau d'orange or cellulitis.  No purulence drainage.  Ileostomy pink.  Flatus and oatmeal thick effluent from ileostomy.  The rest the abdomen is  Soft.  Nondistended.    Ext:   No deformity.  No mjr edema.  No cyanosis Skin: No petechiae / purpurea.  No major sores.  Warm and dry    Results:   Cultures: Recent Results (from the past 720 hours)  Aerobic/Anaerobic Culture w Gram Stain (surgical/deep wound)     Status: None   Collection Time: 11/09/23 11:31 AM   Specimen: Wound; Abdominal Fluid  Result Value Ref Range Status   Specimen Description   Final    WOUND ASCITIES Performed at [facility], [address] [place], [place] [address]    Special Requests   Final    NONE Performed at [facility], [address] [place], [place] [address]    Gram Stain NO WBC  SEEN NO ORGANISMS SEEN   Final   Culture   Final    No growth aerobically or anaerobically. Performed at [facility], [address] [place], [place] [address]    Report Status 11/15/2023 FINAL  Final    Labs: Results for orders placed or performed during the hospital encounter of 12/03/23 (from the past 48 hours)  Lipase, blood     Status: None   Collection Time: 12/03/23  4:46 PM  Result Value Ref Range   Lipase 20 11 - 51 U/L    Comment: Performed at [facility], [address] [place], [place] [address]  Comprehensive metabolic panel     Status: Abnormal   Collection Time: 12/03/23  4:46 PM  Result Value Ref Range   Sodium 136 135 - 145 mmol/L   Potassium 3.6 3.5 - 5.1 mmol/L   Chloride 100 98 - 111 mmol/L   CO2 23 22 - 32 mmol/L   Glucose, Bld 127 (H) 70 - 99 mg/dL    Comment: Glucose reference range applies only to samples taken after fasting for at least 8 hours.   BUN 21 8 - 23 mg/dL   Creatinine, Ser 8.67 (H) 0.44 - 1.00 mg/dL   Calcium  9.2 8.9 - 10.3 mg/dL   Total Protein 6.8 6.5 - 8.1 g/dL   Albumin 3.2 (L) 3.5 - 5.0 g/dL   AST 29 15 - 41 U/L    Comment: HEMOLYSIS AT THIS LEVEL MAY AFFECT RESULT   ALT 23 0 - 44 U/L   Alkaline Phosphatase 133 (H) 38 - 126 U/L   Total Bilirubin 0.4 0.0 - 1.2 mg/dL   GFR, Estimated 42 (L) >60 mL/min    Comment: (NOTE) Calculated using the CKD-EPI Creatinine Equation (2021)    Anion gap 13 5 - 15    Comment: Performed at [facility], [address] [place], [place] [address]  CBC     Status: Abnormal   Collection Time: 12/03/23  4:46 PM  Result Value Ref Range   WBC 10.1 4.0 - 10.5 K/uL   RBC 2.70 (L) 3.87 - 5.11 MIL/uL   Hemoglobin 8.0 (L) 12.0 - 15.0 g/dL   HCT 77.1 (L) 63.9 - 53.9 %   MCV 84.4 80.0 - 100.0 fL   MCH 29.6 26.0 - 34.0 pg   MCHC 35.1 30.0 - 36.0 g/dL   RDW 83.4 (H) 88.4 - 84.4 %   Platelets 451 (H) 150 - 400 K/uL   nRBC  0.0 0.0 - 0.2 %    Comment: Performed  at [facility], [address] [place], [place] [address]  Urinalysis, Routine w reflex microscopic -Urine, Clean Catch     Status: Abnormal   Collection Time: 12/03/23  9:31 PM  Result Value Ref Range   Color, Urine YELLOW YELLOW   APPearance HAZY (A) CLEAR   Specific Gravity, Urine 1.010 1.005 - 1.030   pH 5.0 5.0 - 8.0   Glucose, UA NEGATIVE NEGATIVE mg/dL   Hgb urine dipstick NEGATIVE NEGATIVE   Bilirubin Urine NEGATIVE NEGATIVE   Ketones, ur NEGATIVE NEGATIVE mg/dL   Protein, ur NEGATIVE NEGATIVE mg/dL   Nitrite NEGATIVE NEGATIVE   Leukocytes,Ua NEGATIVE NEGATIVE    Comment: Performed at [facility], [address], [place], [place] [address]  Basic metabolic panel     Status: Abnormal   Collection Time: 12/04/23  5:05 AM  Result Value Ref Range   Sodium 141 135 - 145 mmol/L   Potassium 3.5 3.5 - 5.1 mmol/L   Chloride 105 98 - 111 mmol/L   CO2 25 22 - 32 mmol/L   Glucose, Bld 98 70 - 99 mg/dL    Comment: Glucose reference range applies only to samples taken after fasting for at least 8 hours.   BUN 16 8 - 23 mg/dL   Creatinine, Ser 8.92 (H) 0.44 - 1.00 mg/dL   Calcium  8.8 (L) 8.9 - 10.3 mg/dL   GFR, Estimated 54 (L) >60 mL/min    Comment: (NOTE) Calculated using the CKD-EPI Creatinine Equation (2021)    Anion gap 11 5 - 15    Comment: Performed at [facility], [address] [place], [place] [address]  CBC     Status: Abnormal   Collection Time: 12/04/23  5:05 AM  Result Value Ref Range   WBC 8.6 4.0 - 10.5 K/uL   RBC 2.52 (L) 3.87 - 5.11 MIL/uL   Hemoglobin 7.4 (L) 12.0 - 15.0 g/dL   HCT 78.1 (L) 63.9 - 53.9 %   MCV 86.5 80.0 - 100.0 fL   MCH 29.4 26.0 - 34.0 pg   MCHC 33.9 30.0 - 36.0 g/dL   RDW 83.6 (H) 88.4 - 84.4 %   Platelets 424 (H) 150 - 400 K/uL   nRBC 0.0 0.0 - 0.2 %    Comment: Performed at [facility], [address], [place], [place] [address]     Imaging /  Studies: CT ABDOMEN PELVIS WO CONTRAST Result Date: 12/03/2023 EXAM: CT ABDOMEN AND PELVIS WITHOUT CONTRAST 12/03/2023 09:44:19 PM TECHNIQUE: CT of the abdomen and pelvis was performed without the administration of intravenous contrast. Multiplanar reformatted images are provided for review. Automated exposure control, iterative reconstruction, and/or weight-based adjustment of the mA/kV was utilized to reduce the radiation dose to as low as reasonably achievable. COMPARISON: Prior examination of 11/08/2023. Remote prior examination of 01/12/2015. CLINICAL HISTORY: Abdominal pain, acute, nonlocalized; upper abd pain around colostomy site, drainage, hx of prior abscess. FINDINGS: LOWER CHEST: Scattered bibasilar ground glass opacities have developed, likely reflecting atelectasis. Extensive coronary artery calcifications. Clinical cardiac size within normal limits. Hypoattenuation of the cardiac blood pool in keeping with at least moderate anemia. LIVER: Pneumovila again noted within the liver suggesting changes of prior sphincterotomy. No intra- or extrahepatic biliary ductal dilation. Liver otherwise unremarkable. GALLBLADDER AND BILE DUCTS: Cholelithiasis without superimposed pericholecystic inflammatory change. No biliary ductal dilatation. SPLEEN: No acute abnormality. PANCREAS: No acute abnormality. ADRENAL GLANDS:  Stable 18 mm nodule within the right adrenal gland, likely an adrenal adenoma and minimally enlarged when compared to remote prior examination of 01/12/2015, where this measured 16 mm. The left adrenal gland is unremarkable. KIDNEYS, URETERS AND BLADDER: Kidneys were unremarkable. Bladder unremarkable. No stones in the kidneys or ureters. No hydronephrosis. No perinephric or periureteral stranding. GI AND BOWEL: Surgical changes of proctocolectomy and right lower quadrant end ileostomy are identified. No evidence of obstruction. Stomach demonstrates no acute abnormality. PERITONEUM AND  RETROPERITONEUM: No free intraperitoneal gas or fluid. VASCULATURE: Aorta is normal in caliber. No aortic aneurysm. LYMPH NODES: No lymphadenopathy. REPRODUCTIVE ORGANS: No acute abnormality. BONES AND SOFT TISSUES: Severe lumbar levoscoliosis again noted. No acute bone abnormality. Since the prior examination, the lobulated fluid collection surrounding the ileostomy has enlarged, now measuring up to 5.7 x 12.5 cm within the subcutaneous soft tissues surrounding the terminal ileum and, superiorly, measuring 2.3 x 14.1 cm subjacent to the right upper quadrant anterior abdominal wall. Both these collections appear enlarged since the prior examination. There is, additionally, increasing punctate gas seen within this collections suggesting superinfection and / or enteric communication. There is relative hyperdensity involving several components of this fluid collection suggest that this may be in part related to hemorrhagic fluid. Lobulated fluid appears to track into the subcutaneous soft tissues of the right lateral abdominal wall through the abdominal wall musculature which may reflect dehiscence of a laparoscopic port incision. IMPRESSION: 1. Multiple lobulated fluid collections surrounding the ileostomy with increasing punctate gas, suggestive of superinfection and/or enteric communication, and relative hyperdensity suggestive of hemorrhagic fluid. These collections have enlarged since the prior examination. 2. Surgical changes of total proctocolectomy and right lower quadrant end ileostomy. 3. Cholelithiasis without superimposed pericholecystic inflammatory change. Electronically signed by: Dorethia Molt MD 12/03/2023 10:17 PM EST RP Workstation: [id number]     Medications / Allergies: per chart  Antibiotics: Anti-infectives (From admission, onward)    Start     Dose/Rate Route Frequency Ordered Stop   12/03/23 2300  piperacillin -tazobactam (ZOSYN ) IVPB 3.375 g        3.375 g 12.5 mL/hr over 240  Minutes Intravenous Every 8 hours 12/03/23 2247           Note: Portions of this report may have been transcribed using voice recognition software. Every effort was made to ensure accuracy; however, inadvertent computerized transcription errors may be present.   Any transcriptional errors that result from this process are unintentional.    Elspeth KYM Schultze, MD, FACS, MASCRS Esophageal, Gastrointestinal & Colorectal Surgery Robotic and Minimally Invasive Surgery  [facility] [address] [place], [place] [address] [phone] Fax [phone] Main  CONTACT INFORMATION: Weekday (9AM-5PM): Call [facility] main office at [phone] Weeknight (5PM-9AM) or Weekend/Holiday: Check EPIC Web Links tab & use AMION (password  [email or web address]) for General Surgery [facility] coverage  Please, DO NOT use SecureChat  (it is not reliable communication to reach operating surgeons & will lead to a delay in care).   Epic staff messaging available for outpatient concerns needing 1-2 business day response.      12/04/2023  8:28 AM

## 2023-12-04 NOTE — Plan of Care (Signed)
  Problem: Health Behavior/Discharge Planning: Goal: Ability to manage health-related needs will improve Outcome: Progressing   Problem: Activity: Goal: Risk for activity intolerance will decrease Outcome: Progressing   Problem: Nutrition: Goal: Adequate nutrition will be maintained Outcome: Progressing   

## 2023-12-04 NOTE — Consult Note (Signed)
 WOC Nurse ostomy consult note Established ostomy patient, manages independently at home and would like to maintain home appliance routine, as this works best for her.  Reports has tried convexity in the past and was not successful with this, wishes to remain in flat pouch, 1 pc as she reports cannot manage a 2 piece.  Stoma type/location:  LLQ ileostomy  Stomal assessment/size: red, viable, visualized through pouch which is intact and was last changed on 12/2 Peristomal assessment: unable to visualize appliance in place, intact, last changed 12/2 Treatment options for stomal/peristomal skin: 2 inch barrier ring (patient reports uses ostomy paste at home which works better for her, however this is not on hospital formulary, did discuss that a family member could bring in if desired) Output brown stool Ostomy pouching: 1pc. Flat pouch (lawson # [phone]), barrier ring (lawson # [phone]), patient can utilize home supply of barrier paste if desired.  Education provided: none- patient is independent with ostomy care Enrolled patient in [employer] DC program: No- existing patient   WOC team will not follow patient at this time, please re consult if new needs arise.   Thank you,  Doyal Polite, MSN, RN, [facility] WOC Team [phone] (Available Mon-Fri 0700-1500)

## 2023-12-04 NOTE — Progress Notes (Signed)

## 2023-12-04 NOTE — Progress Notes (Signed)
 PROGRESS NOTE    Donna [name]  FMW:[id number] DOB: 04-Feb-1945 DOA: 12/03/2023 PCP: Clarice Nottingham, MD    Chief Complaint  Patient presents with   Post-op Problem    Brief Narrative:  Donna [name] is a 78 y.o. female with medical history significant for ulcerative colitis, colon cancer s/p colectomy with ileostomy, CKD-3A, GERD, DDD, OSA, incarcerated abdominal wall hernia s/p surgery/repair on 10/18/23 who presented to the ED for evaluation of pain and drainage around her abdominal incision site and admitted for intra-abdominal infection.  Patient placed empirically on IV antibiotics.  General surgery consulted.  IR also consulted for drain placement per general surgery.     Assessment & Plan:   Principal Problem:   Intra-abdominal infection Active Problems:   Hypertension   Status post ileostomy (HCC)   History of hernia repair   CKD stage 3a, GFR 45-59 ml/min (HCC)   Generalized weakness   Anemia  #1 intra-abdominal infection/history of incarcerated abdominal wall hernia s/p repair -Patient noted with multiple hospitalizations over the past month for complications related to recent hernia repair. - Patient presented with diffuse abdominal pain x 3 to 4 days prior to admission with increased drainage and pain around surgical site. - CT abdomen and pelvis with multiple loculated fluid collections surrounding the ileostomy with increasing punctuate gas, suggestive of superinfection and/or enteric communication and relative hyperdensity suggestive of hemorrhagic fluid collections that have enlarged since prior examination. - Patient seen in consultation by general surgery who has asked IR to place drains in collections for now and cultures to be obtained. - It is noted per general surgery the last fluid collection aspiration of the abdominal fluid collection 11/8 was consistent with seroma and cultures negative. -Patient seen by IR and patient for procedure today,  12/04/2023 - Continue empiric IV Zosyn . - Per general surgery.    #2  CKD 3A -Creatinine noted at 1.32 which is not significantly different from recent baseline but slightly worse than 0.99 3 weeks ago. - Renal function improved with hydration and creatinine currently at 1.07. - Follow-up.  3.  Hypertension  - Continue Norvasc  10 mg daily, Lotensin  40 mg daily, labetalol  100 mg twice daily.  4.  Colon cancer status post colectomy with ileostomy -Patient noted with good output from ileostomy bag. - Continue FiberCon, loperamide .  5.  GERD -Continue Pepcid , Protonix .  6.  Chronic leg swelling -Patient noted on admission with 1+ bilateral lower extremity edema on exam unchanged. - TED hose.  7.  Generalized weakness -In the setting of acute illness. - PT/OT.  8.  Anemia - Likely anemia of chronic disease and possible hematoma noted on CT abdomen and pelvis.. - Patient with no overt bleeding. - Anemia panel from 11/08/2023 with iron  level of 15, ferritin of 467, folate of > 20.  Vitamin B12 1740. - Follow H&H. - Transfusion threshold hemoglobin < 7.   DVT prophylaxis: Lovenox , being held today for procedure. Code Status: Full Family Communication: Updated patient.  No family at bedside. Disposition: TBD  Status is: Inpatient Remains inpatient appropriate because: Severity of illness   Consultants:  General Surgery: Dr. Sheldon 12/04/2023 Interventional radiology: Dr. Karalee 12/04/2023  Procedures:  CT abdomen and pelvis 12/03/2023   Antimicrobials:  Anti-infectives (From admission, onward)    Start     Dose/Rate Route Frequency Ordered Stop   12/03/23 2300  piperacillin -tazobactam (ZOSYN ) IVPB 3.375 g        3.375 g 12.5 mL/hr over 240 Minutes Intravenous Every 8  hours 12/03/23 2247           Subjective: Patient laying in bed.  Denies any chest pain no shortness of breath.  Still with diffuse abdominal pain ongoing since Saturday with some discharge noted at  incision sites. No nausea or vomiting.  Objective: Vitals:   12/04/23 0550 12/04/23 0834 12/04/23 1031 12/04/23 1032  BP: (!) 124/55 (!) 127/51 (!) 141/51 (!) 141/51  Pulse: 78 77 78 78  Resp:  18    Temp: 99.4 F (37.4 C) 99 F (37.2 C)    TempSrc: Oral     SpO2: 100% 99%    Weight:      Height:        Intake/Output Summary (Last 24 hours) at 12/04/2023 1200 Last data filed at 12/04/2023 0309 Gross per 24 hour  Intake 190.21 ml  Output 60 ml  Net 130.21 ml   Filed Weights   12/03/23 1640  Weight: 75.3 kg    Examination:  General exam: Appears calm and comfortable  Respiratory system: Clear to auscultation. Respiratory effort normal. Cardiovascular system: S1 & S2 heard, RRR. No JVD, murmurs, rubs, gallops or clicks. No pedal edema. Gastrointestinal system: Abdomen is nondistended, soft and diffusely tender to palpation.  Ileostomy bag with stool noted.  No rebound.  No guarding. Central nervous system: Alert and oriented. No focal neurological deficits. Extremities: Symmetric 5 x 5 power. Skin: No rashes, lesions or ulcers Psychiatry: Judgement and insight appear normal. Mood & affect appropriate.     Data Reviewed: I have personally reviewed following labs and imaging studies  CBC: Recent Labs  Lab 12/03/23 1646 12/04/23 0505  WBC 10.1 8.6  HGB 8.0* 7.4*  HCT 22.8* 21.8*  MCV 84.4 86.5  PLT 451* 424*    Basic Metabolic Panel: Recent Labs  Lab 12/03/23 1646 12/04/23 0505  NA 136 141  K 3.6 3.5  CL 100 105  CO2 23 25  GLUCOSE 127* 98  BUN 21 16  CREATININE 1.32* 1.07*  CALCIUM  9.2 8.8*    GFR: Estimated Creatinine Clearance: 43.5 mL/min (A) (by C-G formula based on SCr of 1.07 mg/dL (H)).  Liver Function Tests: Recent Labs  Lab 12/03/23 1646  AST 29  ALT 23  ALKPHOS 133*  BILITOT 0.4  PROT 6.8  ALBUMIN 3.2*    CBG: No results for input(s): GLUCAP in the last 168 hours.   No results found for this or any previous visit (from the  past 240 hours).       Radiology Studies: CT ABDOMEN PELVIS WO CONTRAST Result Date: 12/03/2023 EXAM: CT ABDOMEN AND PELVIS WITHOUT CONTRAST 12/03/2023 09:44:19 PM TECHNIQUE: CT of the abdomen and pelvis was performed without the administration of intravenous contrast. Multiplanar reformatted images are provided for review. Automated exposure control, iterative reconstruction, and/or weight-based adjustment of the mA/kV was utilized to reduce the radiation dose to as low as reasonably achievable. COMPARISON: Prior examination of 11/08/2023. Remote prior examination of 01/12/2015. CLINICAL HISTORY: Abdominal pain, acute, nonlocalized; upper abd pain around colostomy site, drainage, hx of prior abscess. FINDINGS: LOWER CHEST: Scattered bibasilar ground glass opacities have developed, likely reflecting atelectasis. Extensive coronary artery calcifications. Clinical cardiac size within normal limits. Hypoattenuation of the cardiac blood pool in keeping with at least moderate anemia. LIVER: Pneumovila again noted within the liver suggesting changes of prior sphincterotomy. No intra- or extrahepatic biliary ductal dilation. Liver otherwise unremarkable. GALLBLADDER AND BILE DUCTS: Cholelithiasis without superimposed pericholecystic inflammatory change. No biliary ductal dilatation. SPLEEN: No acute  abnormality. PANCREAS: No acute abnormality. ADRENAL GLANDS: Stable 18 mm nodule within the right adrenal gland, likely an adrenal adenoma and minimally enlarged when compared to remote prior examination of 01/12/2015, where this measured 16 mm. The left adrenal gland is unremarkable. KIDNEYS, URETERS AND BLADDER: Kidneys were unremarkable. Bladder unremarkable. No stones in the kidneys or ureters. No hydronephrosis. No perinephric or periureteral stranding. GI AND BOWEL: Surgical changes of proctocolectomy and right lower quadrant end ileostomy are identified. No evidence of obstruction. Stomach demonstrates no acute  abnormality. PERITONEUM AND RETROPERITONEUM: No free intraperitoneal gas or fluid. VASCULATURE: Aorta is normal in caliber. No aortic aneurysm. LYMPH NODES: No lymphadenopathy. REPRODUCTIVE ORGANS: No acute abnormality. BONES AND SOFT TISSUES: Severe lumbar levoscoliosis again noted. No acute bone abnormality. Since the prior examination, the lobulated fluid collection surrounding the ileostomy has enlarged, now measuring up to 5.7 x 12.5 cm within the subcutaneous soft tissues surrounding the terminal ileum and, superiorly, measuring 2.3 x 14.1 cm subjacent to the right upper quadrant anterior abdominal wall. Both these collections appear enlarged since the prior examination. There is, additionally, increasing punctate gas seen within this collections suggesting superinfection and / or enteric communication. There is relative hyperdensity involving several components of this fluid collection suggest that this may be in part related to hemorrhagic fluid. Lobulated fluid appears to track into the subcutaneous soft tissues of the right lateral abdominal wall through the abdominal wall musculature which may reflect dehiscence of a laparoscopic port incision. IMPRESSION: 1. Multiple lobulated fluid collections surrounding the ileostomy with increasing punctate gas, suggestive of superinfection and/or enteric communication, and relative hyperdensity suggestive of hemorrhagic fluid. These collections have enlarged since the prior examination. 2. Surgical changes of total proctocolectomy and right lower quadrant end ileostomy. 3. Cholelithiasis without superimposed pericholecystic inflammatory change. Electronically signed by: Dorethia Molt MD 12/03/2023 10:17 PM EST RP Workstation: [id number]        Scheduled Meds:  acetaminophen   500 mg Oral TID WC & HS   amLODipine   10 mg Oral QHS   aspirin  EC  81 mg Oral Daily   benazepril   40 mg Oral QHS   calcium  carbonate  2 tablet Oral BID WC   [START ON 12/05/2023]  enoxaparin  (LOVENOX ) injection  40 mg Subcutaneous Q24H   famotidine   20 mg Oral Daily   labetalol   100 mg Oral BID   latanoprost   1 drop Both Eyes QHS   loperamide   2 mg Oral TID   multivitamins with iron   1 tablet Oral Daily   pantoprazole   40 mg Oral BID AC   polycarbophil  625 mg Oral BID   timolol   1 drop Both Eyes Daily   Continuous Infusions:  sodium chloride  100 mL/hr at 12/04/23 0309   piperacillin -tazobactam (ZOSYN )  IV 3.375 g (12/04/23 0600)     LOS: 1 day    Time spent: 40 minutes    Toribio Hummer, MD [facility]   To contact the attending provider between 7A-7P or the covering provider during after hours 7P-7A, please log into the web site [email or web address] and access using universal [place] password for that web site. If you do not have the password, please call the hospital operator.  12/04/2023, 12:00 PM

## 2023-12-04 NOTE — Progress Notes (Signed)
 Referring Physician(s): Donna [name],S  Supervising Physician: Donna [name]  Patient Status:  [name] - In-pt  Chief Complaint: Abdominal/back pain, post op abdominal fluid collections; referred for image guided aspiration/drainage of abdominal fluid collections   Subjective: Patient known to IR team from CT-guided aspiration of preperitoneal fluid collection in the right upper quadrant on 11/09/2023.  Cultures were negative.  [name] with medical history significant for ulcerative colitis, colon cancer s/p colectomy with ileostomy, CKD-3A, GERD, DDD, OSA, incarcerated abdominal wall hernia s/p surgery/repair on 10/18/23 who presented to the ED for evaluation of pain and drainage around her abdominal incision site.  Patient recently admitted from 11/7 - 11/11 for AKI, possible abdominal abscess around colectomy, and underwent above-noted aspiration of abdominal fluid collection.  She now presents with persistent abdominal pain and some drainage around surgical site since this past weekend.  Latest CT of the abdomen pelvis performed yesterday revealed:  1. Multiple lobulated fluid collections surrounding the ileostomy with increasing punctate gas, suggestive of superinfection and/or enteric communication, and relative hyperdensity suggestive of hemorrhagic fluid. These collections have enlarged since the prior examination. 2. Surgical changes of total proctocolectomy and right lower quadrant end ileostomy. 3. Cholelithiasis without superimposed pericholecystic inflammatory change.   Latest temp 99, WBC normal, hemoglobin 7.4, platelets 424k, creatinine 1.07, PT/INR normal.  Patient on IV Zosyn .  Request now received from surgical team for repeat image guided aspiration/possible drainage of abdominal fluid collections.    Past Medical History:  Diagnosis Date   Anemia    Arthritis    Bulging lumbar disc    Cancer (HCC)    colon   [name] (HCC) 01/13/2015    Degeneration of lumbar or lumbosacral intervertebral disc 01/21/2015   Essential hypertension 01/21/2015   GERD (gastroesophageal reflux disease) 01/21/2015   Heart murmur    ECHO 05-17-2023   History of excision of intestinal structure 10/18/2023   Hyperlipidemia 01/21/2015   Hypertension    Pre-diabetes    diet   S/P total colectomy 05/29/2019   Ulcerative colitis Donna [name])    Past Surgical History:  Procedure Laterality Date   BIOPSY  06/29/2022   Procedure: BIOPSY;  Surgeon: Donna Lonni HERO, MD;  Location: [facility];  Service: General;;   CESAREAN SECTION     COLON SURGERY  2015   for twisted bowel   ERCP N/A 07/10/2023   Procedure: ERCP, WITH INTERVENTION IF INDICATED;  Surgeon: Donna Kitchens, MD;  Location: [facility];  Service: Gastroenterology;  Laterality: N/A;   EYE SURGERY Right    cataract extraction   FLEXIBLE SIGMOIDOSCOPY N/A 05/18/2020   Procedure: SURVEILLANCE FLEXIBLE SIGMOIDOSCOPY;  Surgeon: Donna Lonni HERO, MD;  Location: [facility];  Service: General;  Laterality: N/A;   FLEXIBLE SIGMOIDOSCOPY N/A 06/29/2022   Procedure: FLEXIBLE SIGMOIDOSCOPY;  Surgeon: Donna Lonni HERO, MD;  Location: [facility];  Service: General;  Laterality: N/A;   FLEXIBLE SIGMOIDOSCOPY Left 08/29/2023   Procedure: KINGSTON SIDE;  Surgeon: Donna Lonni HERO, MD;  Location: [facility];  Service: General;  Laterality: Left;   INCISIONAL HERNIA REPAIR N/A 05/29/2019   Procedure: PRIMARY INCISIONAL HERNIA REPAIR;  Surgeon: Donna Lonni HERO, MD;  Location: [facility];  Service: General;  Laterality: N/A;   LAPAROTOMY Right 01/12/2015   Procedure: OPEN RIGHT COLECTOMY;  Surgeon: Donna Bury, MD;  Location: [name];  Service: General;  Laterality: Right;   RIGHT COLECTOMY  01/12/2015   TOTAL KNEE ARTHROPLASTY Left 06/20/2018   Procedure: TOTAL KNEE ARTHROPLASTY;  Surgeon: Yvone Rush, MD;  Location: [facility];  Service: Orthopedics;  Laterality: Left;   XI  ROBOTIC ASSISTED PARASTOMAL HERNIA REPAIR N/A 10/18/2023   Procedure: REPAIR, HERNIA, PARASTOMAL, ROBOT-ASSISTED;  Surgeon: Sheldon Standing, MD;  Location: [facility];  Service: General;  Laterality: N/A;  ROBOTIC PARASTOMAL & INCISIONAL HERNIA REPAIRS WITH MESH   XI ROBOTIC ASSISTED VENTRAL HERNIA N/A 10/18/2023   Procedure: REPAIR, HERNIA, VENTRAL, ROBOT-ASSISTED;  Surgeon: Sheldon Standing, MD;  Location: [facility];  Service: General;  Laterality: N/A;      Allergies: Ozempic (0.25 or 0.5 mg-dose) [semaglutide(0.25 or 0.5mg -dos)], Ezetimibe, Gabapentin , Iodinated contrast media, Mesalamine, Oxycodone , Prilosec [omeprazole ], and Statins  Medications: Prior to Admission medications   Medication Sig Start Date End Date Taking? Authorizing Provider  acetaminophen  (TYLENOL ) 500 MG tablet Take 1,000 mg by mouth every 6 (six) hours as needed for mild pain or headache.   Yes [provider]  amLODipine  (NORVASC ) 10 MG tablet Take 10 mg by mouth at bedtime. 05/20/18  Yes [provider]  aspirin  EC 81 MG tablet Take 1 tablet (81 mg total) by mouth daily. Swallow whole. 03/20/21  Yes Patwardhan, Manish J, MD  B Complex Vitamins (B COMPLEX 100 PO) Take 1 tablet by mouth daily.   Yes [provider]  benazepril  (LOTENSIN ) 40 MG tablet Take 40 mg by mouth at bedtime. 05/14/18  Yes [provider]  calcium  carbonate (OS-CAL - DOSED IN MG OF ELEMENTAL CALCIUM ) 1250 (500 Ca) MG tablet Take 2 tablets by mouth 2 (two) times daily with a meal.   Yes [provider]  cyclobenzaprine  (FLEXERIL ) 5 MG tablet Take 1 tablet (5 mg total) by mouth 3 (three) times daily as needed for muscle spasms. 10/18/23  Yes Sheldon Standing, MD  Evolocumab  (REPATHA  SURECLICK) 140 MG/ML SOAJ Inject 140 mg (1 pen) into the skin every 14 days 07/08/23  Yes Patwardhan, Manish J, MD  famotidine  (PEPCID ) 20 MG tablet Take 20 mg by mouth daily.   Yes [provider]  hydrochlorothiazide  (HYDRODIURIL ) 25  MG tablet Take 25 mg by mouth daily.   Yes [provider]  labetalol  (NORMODYNE ) 100 MG tablet TAKE 1 TABLET TWICE DAILY (NEED TO KEEP UPCOMING APPOINTMENT IN APRIL TO PREVENT ANY MISSED DOSES) Patient taking differently: Take 100 mg by mouth 2 (two) times daily. 05/03/23  Yes Patwardhan, Manish J, MD  latanoprost  (XALATAN ) 0.005 % ophthalmic solution Place 1 drop into both eyes at bedtime. 05/05/22  Yes [provider]  loperamide  (IMODIUM ) 2 MG capsule Take 1 capsule (2 mg total) by mouth 2 (two) times daily. Patient taking differently: Take 4 mg by mouth in the morning, at noon, and at bedtime. 06/04/19  Yes Donna Lonni HERO, MD  Menthol -Methyl Salicylate (MUSCLE RUB) 10-15 % CREA Apply 1 application topically 2 (two) times daily as needed for muscle pain.   Yes [provider]  metoCLOPramide  (REGLAN ) 5 MG tablet Take 1 tablet (5 mg total) by mouth every 8 (eight) hours as needed for nausea or vomiting. 10/31/23  Yes Sheldon Standing, MD  Multiple Vitamin (MULTIVITAMIN WITH MINERALS) TABS tablet Take 1 tablet by mouth daily. Woman's one a day   Yes [provider]  pantoprazole  (PROTONIX ) 40 MG tablet Take 40 mg by mouth 2 (two) times daily before a meal.    Yes [provider]  polycarbophil (FIBERCON) 625 MG tablet Take 1 tablet (625 mg total) by mouth 2 (two) times daily. 11/12/23 12/12/23 Yes Patsy Lenis, MD  psyllium (  HYDROCIL/METAMUCIL) 95 % PACK Take 1 packet by mouth daily. 10/30/23  Yes Sheldon Standing, MD  timolol  (TIMOPTIC ) 0.5 % ophthalmic solution Place 1 drop into both eyes every morning. 06/17/22  Yes [provider]  traMADol  (ULTRAM ) 50 MG tablet Take 1 tablet (50 mg total) by mouth every 6 (six) hours as needed for moderate pain (pain score 4-6) or severe pain (pain score 7-10). 11/12/23  Yes Patsy Lenis, MD  Vitamins-Lipotropics (COMPLEX B-100-INOSITOL) TBCR Take 1 tablet by mouth daily. 11/20/23  Yes [provider]   Multiple Vitamins-Iron  (MULTIVITAMINS WITH IRON ) TABS tablet Take 1 tablet by mouth daily. 11/13/23   Patsy Lenis, MD  traZODone  (DESYREL ) 50 MG tablet Take 50 mg by mouth at bedtime. 02/11/23   [provider]     Vital Signs: BP (!) 141/51   Pulse 78   Temp 99 F (37.2 C)   Resp 18   Ht 5' 3 (1.6 m)   Wt 166 lb (75.3 kg)   SpO2 99%   BMI 29.41 kg/m   Physical Exam: Awake, alert.  Chest with slightly diminished breath sounds bases.  Heart with regular rate and rhythm.  Abdomen soft, few bowel sounds, moderately tender at ileostomy site; 1+ bilateral lower extremity edema.  Imaging: CT ABDOMEN PELVIS WO CONTRAST Result Date: 12/03/2023 EXAM: CT ABDOMEN AND PELVIS WITHOUT CONTRAST 12/03/2023 09:44:19 PM TECHNIQUE: CT of the abdomen and pelvis was performed without the administration of intravenous contrast. Multiplanar reformatted images are provided for review. Automated exposure control, iterative reconstruction, and/or weight-based adjustment of the mA/kV was utilized to reduce the radiation dose to as low as reasonably achievable. COMPARISON: Prior examination of 11/08/2023. Remote prior examination of 01/12/2015. CLINICAL HISTORY: Abdominal pain, acute, nonlocalized; upper abd pain around colostomy site, drainage, hx of prior abscess. FINDINGS: LOWER CHEST: Scattered bibasilar ground glass opacities have developed, likely reflecting atelectasis. Extensive coronary artery calcifications. Clinical cardiac size within normal limits. Hypoattenuation of the cardiac blood pool in keeping with at least moderate anemia. LIVER: Pneumovila again noted within the liver suggesting changes of prior sphincterotomy. No intra- or extrahepatic biliary ductal dilation. Liver otherwise unremarkable. GALLBLADDER AND BILE DUCTS: Cholelithiasis without superimposed pericholecystic inflammatory change. No biliary ductal dilatation. SPLEEN: No acute abnormality. PANCREAS: No acute abnormality. ADRENAL  GLANDS: Stable 18 mm nodule within the right adrenal gland, likely an adrenal adenoma and minimally enlarged when compared to remote prior examination of 01/12/2015, where this measured 16 mm. The left adrenal gland is unremarkable. KIDNEYS, URETERS AND BLADDER: Kidneys were unremarkable. Bladder unremarkable. No stones in the kidneys or ureters. No hydronephrosis. No perinephric or periureteral stranding. GI AND BOWEL: Surgical changes of proctocolectomy and right lower quadrant end ileostomy are identified. No evidence of obstruction. Stomach demonstrates no acute abnormality. PERITONEUM AND RETROPERITONEUM: No free intraperitoneal gas or fluid. VASCULATURE: Aorta is normal in caliber. No aortic aneurysm. LYMPH NODES: No lymphadenopathy. REPRODUCTIVE ORGANS: No acute abnormality. BONES AND SOFT TISSUES: Severe lumbar levoscoliosis again noted. No acute bone abnormality. Since the prior examination, the lobulated fluid collection surrounding the ileostomy has enlarged, now measuring up to 5.7 x 12.5 cm within the subcutaneous soft tissues surrounding the terminal ileum and, superiorly, measuring 2.3 x 14.1 cm subjacent to the right upper quadrant anterior abdominal wall. Both these collections appear enlarged since the prior examination. There is, additionally, increasing punctate gas seen within this collections suggesting superinfection and / or enteric communication. There is relative hyperdensity involving several components of this fluid collection suggest that this  may be in part related to hemorrhagic fluid. Lobulated fluid appears to track into the subcutaneous soft tissues of the right lateral abdominal wall through the abdominal wall musculature which may reflect dehiscence of a laparoscopic port incision. IMPRESSION: 1. Multiple lobulated fluid collections surrounding the ileostomy with increasing punctate gas, suggestive of superinfection and/or enteric communication, and relative hyperdensity  suggestive of hemorrhagic fluid. These collections have enlarged since the prior examination. 2. Surgical changes of total proctocolectomy and right lower quadrant end ileostomy. 3. Cholelithiasis without superimposed pericholecystic inflammatory change. Electronically signed by: Dorethia Molt MD 12/03/2023 10:17 PM EST RP Workstation: [id number]    Labs:  CBC: Recent Labs    11/11/23 0518 11/11/23 2120 11/12/23 0517 12/03/23 1646 12/04/23 0505  WBC 5.8  --  6.0 10.1 8.6  HGB 6.9* 7.1* 7.0* 8.0* 7.4*  HCT 21.2* 21.1* 20.8* 22.8* 21.8*  PLT 341  --  336 451* 424*    COAGS: Recent Labs    11/09/23 0542  INR 1.0    BMP: Recent Labs    11/11/23 0518 11/12/23 0518 12/03/23 1646 12/04/23 0505  NA 140 141 136 141  K 3.8 4.2 3.6 3.5  CL 105 106 100 105  CO2 28 29 23 25   GLUCOSE 101* 93 127* 98  BUN 16 10 21 16   CALCIUM  8.9 8.9 9.2 8.8*  CREATININE 1.11* 0.99 1.32* 1.07*  GFRNONAA 51* 59* 42* 54*    LIVER FUNCTION TESTS: Recent Labs    11/08/23 1114 11/09/23 0542 12/03/23 1646  BILITOT 0.5 0.6 0.4  AST 24 25 29   ALT 18 15 23   ALKPHOS 133* 116 133*  PROT 6.8 6.2* 6.8  ALBUMIN 3.3* 3.0* 3.2*    Assessment and Plan: 78 y.o. female with medical history significant for ulcerative colitis, colon cancer s/p colectomy with ileostomy, CKD-3A, GERD, DDD, OSA, incarcerated abdominal wall hernia s/p surgery/repair on 10/18/23 who presented to the ED for evaluation of pain and drainage around her abdominal incision site.  Patient recently admitted from 11/7 - 11/11 for AKI, possible abdominal abscess around colectomy, and underwent  aspiration of abdominal fluid collection on 11/09/23 with neg culture.  She now presents with persistent abdominal pain and some drainage around surgical site since this past weekend.  Latest CT of the abdomen pelvis performed yesterday revealed:  1. Multiple lobulated fluid collections surrounding the ileostomy with increasing punctate gas,  suggestive of superinfection and/or enteric communication, and relative hyperdensity suggestive of hemorrhagic fluid. These collections have enlarged since the prior examination. 2. Surgical changes of total proctocolectomy and right lower quadrant end ileostomy. 3. Cholelithiasis without superimposed pericholecystic inflammatory change.   Latest temp 99, WBC normal, hemoglobin 7.4, platelets 424k, creatinine 1.07, PT/INR normal.  Patient on IV Zosyn .  Request now received from surgical team for repeat image guided aspiration/possible drainage of abdominal fluid collections.  Imaging studies have been reviewed by Dr. Karalee.Risks and benefits discussed with the patient/family including bleeding, infection, damage to adjacent structures, bowel perforation/fistula connection, and sepsis.  All of the patient's questions were answered, patient is agreeable to proceed. Consent signed and in chart.  Procedure scheduled for today.    Electronically Signed: D. Franky Rakers, PA-C 12/04/2023, 10:57 AM   I spent a total of 25 minutes at the the patient's bedside AND on the patient's hospital floor or unit, greater than 50% of which was counseling/coordinating care for image guided aspiration/possible drainage of abdominal fluid collections    Patient ID: Donna [name], female   DOB:  1945-04-12, 78 y.o.   MRN: [id number]

## 2023-12-04 NOTE — Progress Notes (Signed)
 PT Cancellation Note  Patient Details Name: Donna [name] MRN: [id number] DOB: Sep 30, 1945   Cancelled Treatment:    Reason Eval/Treat Not Completed: Medical issues which prohibited therapy Recently had a drain placed, experiencing some discomfort per RN. PT will eval tomorrow . Darice Potters PT Acute Rehabilitation Services Office [phone]   Potters Darice Norris 12/04/2023, 3:41 PM

## 2023-12-04 NOTE — Plan of Care (Signed)
  Problem: Education: Goal: Knowledge of General Education information will improve Description: Including pain rating scale, medication(s)/side effects and non-pharmacologic comfort measures Outcome: Progressing   Problem: Health Behavior/Discharge Planning: Goal: Ability to manage health-related needs will improve Outcome: Progressing   Problem: Clinical Measurements: Goal: Ability to maintain clinical measurements within normal limits will improve Outcome: Progressing Goal: Will remain free from infection Outcome: Progressing Goal: Diagnostic test results will improve Outcome: Progressing   Problem: Coping: Goal: Level of anxiety will decrease Outcome: Progressing   Problem: Pain Managment: Goal: General experience of comfort will improve and/or be controlled Outcome: Progressing

## 2023-12-04 NOTE — Procedures (Signed)
 Interventional Radiology Procedure Note  Procedure: Placement of a 21F drain via left mid abdominal approach with evacuation of 250 mL purulent fluid.  Samples sent for culture.  JP bulb.  Complications: None  Estimated Blood Loss: None  Recommendations: - Drain to JP - Flush TID   Signed,  Wilkie LOIS Lent, MD

## 2023-12-05 ENCOUNTER — Inpatient Hospital Stay: Admission: RE | Admit: 2023-12-05 | Source: Ambulatory Visit

## 2023-12-05 DIAGNOSIS — Z9889 Other specified postprocedural states: Secondary | ICD-10-CM | POA: Diagnosis not present

## 2023-12-05 DIAGNOSIS — N1831 Chronic kidney disease, stage 3a: Secondary | ICD-10-CM | POA: Diagnosis not present

## 2023-12-05 DIAGNOSIS — Z932 Ileostomy status: Secondary | ICD-10-CM | POA: Diagnosis not present

## 2023-12-05 DIAGNOSIS — B999 Unspecified infectious disease: Secondary | ICD-10-CM | POA: Diagnosis not present

## 2023-12-05 LAB — BASIC METABOLIC PANEL WITH GFR
Anion gap: 11 (ref 5–15)
BUN: 15 mg/dL (ref 8–23)
CO2: 25 mmol/L (ref 22–32)
Calcium: 8.6 mg/dL — ABNORMAL LOW (ref 8.9–10.3)
Chloride: 107 mmol/L (ref 98–111)
Creatinine, Ser: 1.04 mg/dL — ABNORMAL HIGH (ref 0.44–1.00)
GFR, Estimated: 55 mL/min — ABNORMAL LOW (ref >60)
Glucose, Bld: 94 mg/dL (ref 70–99)
Potassium: 3.3 mmol/L — ABNORMAL LOW (ref 3.5–5.1)
Sodium: 142 mmol/L (ref 135–145)

## 2023-12-05 LAB — CBC WITH DIFFERENTIAL/PLATELET
Abs Immature Granulocytes: 0.08 K/uL — ABNORMAL HIGH (ref 0.00–0.07)
Basophils Absolute: 0.1 K/uL (ref 0.0–0.1)
Basophils Relative: 1 %
Eosinophils Absolute: 0.3 K/uL (ref 0.0–0.5)
Eosinophils Relative: 4 %
HCT: 19.6 % — ABNORMAL LOW (ref 36.0–46.0)
Hemoglobin: 6.7 g/dL — CL (ref 12.0–15.0)
Immature Granulocytes: 1 %
Lymphocytes Relative: 14 %
Lymphs Abs: 1.1 K/uL (ref 0.7–4.0)
MCH: 29 pg (ref 26.0–34.0)
MCHC: 34.2 g/dL (ref 30.0–36.0)
MCV: 84.8 fL (ref 80.0–100.0)
Monocytes Absolute: 1 K/uL (ref 0.1–1.0)
Monocytes Relative: 12 %
Neutro Abs: 5.4 K/uL (ref 1.7–7.7)
Neutrophils Relative %: 68 %
Platelets: 426 K/uL — ABNORMAL HIGH (ref 150–400)
RBC: 2.31 MIL/uL — ABNORMAL LOW (ref 3.87–5.11)
RDW: 17 % — ABNORMAL HIGH (ref 11.5–15.5)
Smear Review: NORMAL
WBC: 7.9 K/uL (ref 4.0–10.5)
nRBC: 0 % (ref 0.0–0.2)

## 2023-12-05 LAB — IRON AND TIBC
Iron: 54 ug/dL (ref 28–170)
Saturation Ratios: 29 % (ref 10.4–31.8)
TIBC: 186 ug/dL — ABNORMAL LOW (ref 250–450)
UIBC: 132 ug/dL

## 2023-12-05 LAB — PREPARE RBC (CROSSMATCH)

## 2023-12-05 LAB — FOLATE: Folate: >20 ng/mL (ref >5.9)

## 2023-12-05 LAB — MAGNESIUM: Magnesium: 1.6 mg/dL — ABNORMAL LOW (ref 1.7–2.4)

## 2023-12-05 LAB — FERRITIN: Ferritin: 961 ng/mL — ABNORMAL HIGH (ref 11–307)

## 2023-12-05 LAB — VITAMIN B12: Vitamin B-12: 2421 pg/mL — ABNORMAL HIGH (ref 180–914)

## 2023-12-05 LAB — PHOSPHORUS: Phosphorus: 1.8 mg/dL — ABNORMAL LOW (ref 2.5–4.6)

## 2023-12-05 LAB — OCCULT BLOOD X 1 CARD TO LAB, STOOL: Fecal Occult Bld: POSITIVE — AB

## 2023-12-05 MED ORDER — SODIUM CHLORIDE 0.9% IV SOLUTION: Freq: Once | INTRAVENOUS | Status: DC

## 2023-12-05 MED ORDER — SODIUM CHLORIDE 0.9% IV SOLUTION: Freq: Once | INTRAVENOUS | Status: AC

## 2023-12-05 MED ORDER — MAGNESIUM SULFATE 2 GM/50ML IV SOLN
2.0000 g | Freq: Once | INTRAVENOUS | Status: AC
  Administered 2023-12-05: 2 g via INTRAVENOUS
  Filled 2023-12-05: qty 50

## 2023-12-05 MED ORDER — POTASSIUM CHLORIDE CRYS ER 20 MEQ PO TBCR
40.0000 meq | EXTENDED_RELEASE_TABLET | Freq: Every day | ORAL | Status: AC
  Administered 2023-12-05 – 2023-12-07 (×3): 40 meq via ORAL
  Filled 2023-12-05 (×3): qty 2

## 2023-12-05 MED ORDER — POTASSIUM PHOSPHATES 15 MMOLE/5ML IV SOLN
30.0000 mmol | Freq: Once | INTRAVENOUS | Status: AC
  Administered 2023-12-05: 30 mmol via INTRAVENOUS
  Filled 2023-12-05: qty 10

## 2023-12-05 MED ORDER — ORAL CARE MOUTH RINSE: 15.0000 mL | OROMUCOSAL | Status: DC | PRN

## 2023-12-05 MED ORDER — ACETAMINOPHEN 325 MG PO TABS: 650.0000 mg | ORAL_TABLET | Freq: Once | ORAL | Status: DC

## 2023-12-05 NOTE — Progress Notes (Signed)
 PT Cancellation Note  Patient Details Name: Donna [name] Dpwija MRN: [id number] DOB: [birth year]   Cancelled Treatment:    Reason Eval/Treat Not Completed: Medical issues which prohibited therapy  Patient receiving blood products at this time. Will check back after infusion complete. Darice Potters PT Acute Rehabilitation Services Office [phone]  Potters Darice Norris 12/05/2023, 12:11 PM

## 2023-12-05 NOTE — Plan of Care (Signed)

## 2023-12-05 NOTE — Progress Notes (Signed)
 12/05/2023  Christeen Lai Calder [id number] [birth year]  CARE TEAM: PCP: Clarice Nottingham, MD  Outpatient Care Team: Patient Care Team: Clarice Nottingham, MD as PCP - General (Internal Medicine) Elmira Newman PARAS, MD as PCP - Cardiology (Cardiology) Teresa Lonni HERO, MD as Consulting Physician (Colon and Rectal Surgery) Sheldon Standing, MD as Consulting Physician (General Surgery) Donnald Charleston, MD as Consulting Physician (Gastroenterology) Elmira Newman PARAS, MD as Consulting Physician (Cardiology)  Inpatient Treatment Team: Treatment Team:  Sebastian Toribio GAILS, MD Bobbette File, MD Sheldon Standing, MD Leigh Darice BRAVO, PT Ruthellen Ruthellen Radiology, MD Elouise Elsie BROCKS, RN [place], [place], NT Vincente Grip, RN [place], [place], RPH [place], [place] HERO, RN Maryellen Ernie SAUNDERS, RN Estelle Hunter DEL, RN   Problem List:   Principal Problem:   Intra-abdominal infection Active Problems:   Hypertension   Status post ileostomy [facility])   History of hernia repair   CKD stage 3a, GFR 45-59 ml/min (HCC)   Generalized weakness   Anemia   10/18/2023  POST-OPERATIVE DIAGNOSIS:  PARASTOMAL HERNIA AND VENTRAL INCISIONAL RECURRENT AND INCARCERATED ABDOMINAL WALL HERNIAE  Dimensions of hernia post-op:  16cm x 8cm   PROCEDURE:   ROBOTIC COMPONENT SEPARATION (transversus abdominis release = TAR) on right Repair of incarcerated recurrent incisional and parastomal hernias with mesh ROBOTIC LYSIS OF ADHESIONS SEROSAL REPAIR x2 END ILEOSTOMY REVISION TAP BLOCK - BILATERAL   SURGEON:  Standing KYM Sheldon, MD  OR FINDINGS:  Moderately dense adhesions small bowel to anterior abdominal wall and periumbilical midline hernia.  Swiss cheese hernias extending up to the subxiphoid region.  15 cm in length.  Moderate parastomal hernia around end ileostomy with a foot of small bowel densely adherent to the hernia sac.  Serosal repairs down x 2.  Ileostomy revision done for last 7 cm  given poor tissues.   Type of ventral wall repair:  Laparoscopic underlay repair .with Primary repair of largest hernia Placement of mesh: Right lateral and flank retrorectus preperitoneal.  Midline intraperitoneal. Name of mesh: Bard Ventralight dual sided (polypropylene / Seprafilm) Size of mesh: 33x27cm Orientation: Transverse Mesh overlap:  7cm    Assessment [facility] Stay = 2 days)      New abd wall collections - 6 weeks after parastomal & incisional hernia repairs with mesh.  Fluid collection concern for abscess  Plan:  New collection formation rather odd 6 weeks later in old hernia sac & right abd wall.    IR drainage 12/3 concern for purulence -everything collapsed with a one drain fortunately Gram stain with gram-positive cocci Continue piperacillin /tazobactam.  Rule out MRSA.  Suspect drain needs to stay in for a while with follow-up drain study to make sure everything collapses and not come back.  Suspect patient is going to need antibiotics for prolonged period of time since she has fluid collections near her mesh.  Would like to hold off on removing all the mesh unless we are months into this and it will not consistently resolve.  My instinct should probably need 6 weeks of antibiotics.  Check sensitivities and see if we can transition to something orally.  Might need to have infectious ease involved but I would hold off for now.   Low  hemoglobin in the setting of iron  deficiency anemia.    Give IV iron  since doubt good PO intake No extra anticoagulation for now Dropped hemoglobin getting transfused.  Suspect this was more of an infected hematoma.  There is no evidence of any active GI bleeding.  No hematochezia  or hematemesis.    Solid diet as tolerated.    Ileostomy pink with no leaking or concerns there.  Wound ostomy nursing team evaluated per my request and agrees.  Doubt perforation or separation there.    Continue usual bowel regimen with with fiber  and loperamide .  Do twice daily regimen with low threshold to increase to 3 times daily    -monitor electrolytes & replace as needed.  Corrected hypokalemia.  Keep K>4, Mg>2, Phos>3  Hypertension, GERD, sleep apnea, etc. per primary service often uses oxygen at night for sleep apnea..  -VTE prophylaxis- SCDs.  Anticoagulation prophyllaxis SQ as appropriate  -mobilize as tolerated to help recovery.  Enlist therapies in moderate/high risk patients as appropriate  I updated the patient's status to the patient  Recommendations were made.  Questions were answered.  She expressed understanding & appreciation.  -Disposition: I suspect we need to get cultures and sensitivities to figure out I&O antibiotic plan.    I reviewed nursing notes, last 24 h vitals and pain scores, last 48 h intake and output, last 24 h labs and trends, and last 24 h imaging results.  I have reviewed this patient's available data, including medical history, events of note, test results, etc as part of my evaluation.   A significant portion of that time was spent in counseling. Care during the described time interval was provided by me.  This care required moderate level of medical decision making.  12/05/2023    Subjective: (Chief complaint)  Drain placement yesterday.  Feeling much better.  Soreness less.  No nausea or vomiting eating breakfast.  Nursing just outside in room.  Objective:  Vital signs:  Vitals:   12/04/23 1310 12/04/23 1334 12/04/23 2055 12/05/23 0531  BP: (!) 129/51 122/61 (!) 127/49 134/67  Pulse: 73 71 73 82  Resp: 19 18 18 18   Temp:  98.7 F (37.1 C) 98.8 F (37.1 C) 98.3 F (36.8 C)  TempSrc:      SpO2: 97% 95% 98% 100%  Weight:      Height:        Last BM Date : 12/05/23  Intake/Output   Yesterday:  12/03 0701 - 12/04 0700 In: 5 [I.V.:5] Out: 375 [Drains:375] This shift:  No intake/output data recorded.  Bowel function:  Flatus: YES  BM:  YES  Drain: Left  periumbilical drain in place with thin serous with slightly purulent effluent   Physical Exam:  General: Pt awake/alert in no acute distress.  Walking around the room with no assistance.  Smiling.  Not toxic.  Not sickly.  This is the best time seen her .  Calm.   Eyes: PERRL, normal EOM.  Sclera clear.  No icterus Neuro: CN II-XII intact w/o focal sensory/motor deficits. Lymph: No head/neck/groin lymphadenopathy Psych:  No delerium/psychosis/paranoia.  Oriented x 4 HENT: Normocephalic, Mucus membranes moist.  No thrush Neck: Supple, No tracheal deviation.  No obvious thyromegaly Chest: No pain to chest wall compression.  Good respiratory excursion.  No audible wheezing CV:  Pulses intact.  Regular rhythm.  No major extremity edema MS: Normal AROM mjr joints.  No obvious deformity  Abdomen: Obese.  Still sensitive in right subcostal and right flank but less.  Swelling gone down.  No peau d'orange or cellulitis.   Ileostomy pink.  No evidence of any separation around the edges or necrosis or dehiscence.  No infection abscess or purulence there.  Flatus and oatmeal thick effluent from ileostomy.  The rest the abdomen  is  Soft.  Nondistended.    Ext:   No deformity.  No mjr edema.  No cyanosis Skin: No petechiae / purpurea.  No major sores.  Warm and dry    Results:   Cultures: Recent Results (from the past 720 hours)  Aerobic/Anaerobic Culture w Gram Stain (surgical/deep wound)     Status: None   Collection Time: 11/09/23 11:31 AM   Specimen: Wound; Abdominal Fluid  Result Value Ref Range Status   Specimen Description   Final    WOUND ASCITIES Performed at [facility], [address], [place], [place] [address]    Special Requests   Final    NONE Performed at [facility], [address], [place], [place] [address]    Gram Stain NO WBC SEEN NO ORGANISMS SEEN   Final   Culture   Final    No growth aerobically or anaerobically. Performed  at [facility], [address] [place], [place] [address]    Report Status 11/15/2023 FINAL  Final  Aerobic/Anaerobic Culture w Gram Stain (surgical/deep wound)     Status: None (Preliminary result)   Collection Time: 12/04/23  1:50 PM   Specimen: Abscess  Result Value Ref Range Status   Specimen Description   Final    ABSCESS Performed at [facility], [address], [place], [place] [address]    Special Requests   Final    NONE Performed at [facility], [address], [place], [place] [address]    Gram Stain   Final    ABUNDANT WBC PRESENT, PREDOMINANTLY PMN ABUNDANT GRAM POSITIVE COCCI IN PAIRS IN CHAINS Performed at [facility], [address], [place], [place] [address]    Culture PENDING  Incomplete   Report Status PENDING  Incomplete    Labs: Results for orders placed or performed during the hospital encounter of 12/03/23 (from the past 48 hours)  Lipase, blood     Status: None   Collection Time: 12/03/23  4:46 PM  Result Value Ref Range   Lipase 20 11 - 51 U/L    Comment: Performed at [facility], [address] [place], [place] [address]  Comprehensive metabolic panel     Status: Abnormal   Collection Time: 12/03/23  4:46 PM  Result Value Ref Range   Sodium 136 135 - 145 mmol/L   Potassium 3.6 3.5 - 5.1 mmol/L   Chloride 100 98 - 111 mmol/L   CO2 23 22 - 32 mmol/L   Glucose, Bld 127 (H) 70 - 99 mg/dL    Comment: Glucose reference range applies only to samples taken after fasting for at least 8 hours.   BUN 21 8 - 23 mg/dL   Creatinine, Ser 8.67 (H) 0.44 - 1.00 mg/dL   Calcium  9.2 8.9 - 10.3 mg/dL   Total Protein 6.8 6.5 - 8.1 g/dL   Albumin 3.2 (L) 3.5 - 5.0 g/dL   AST 29 15 - 41 U/L    Comment: HEMOLYSIS AT THIS LEVEL MAY AFFECT RESULT   ALT 23 0 - 44 U/L   Alkaline Phosphatase 133 (H) 38 - 126 U/L   Total Bilirubin 0.4 0.0 - 1.2 mg/dL   GFR, Estimated 42 (L) >60 mL/min     Comment: (NOTE) Calculated using the CKD-EPI Creatinine Equation (2021)    Anion gap 13 5 - 15    Comment: Performed at [facility], [address] [place], [place] [address]  CBC     Status: Abnormal   Collection Time: 12/03/23  4:46 PM  Result Value Ref Range   WBC 10.1 4.0 - 10.5 K/uL   RBC 2.70 (L) 3.87 - 5.11 MIL/uL   Hemoglobin 8.0 (L) 12.0 - 15.0 g/dL   HCT 77.1 (L) 63.9 - 53.9 %   MCV 84.4 80.0 - 100.0 fL   MCH 29.6 26.0 - 34.0 pg   MCHC 35.1 30.0 - 36.0 g/dL   RDW 83.4 (H) 88.4 - 84.4 %   Platelets 451 (H) 150 - 400 K/uL   nRBC 0.0 0.0 - 0.2 %    Comment: Performed at [facility], [address] [place], [place] [address]  Urinalysis, Routine w reflex microscopic -Urine, Clean Catch     Status: Abnormal   Collection Time: 12/03/23  9:31 PM  Result Value Ref Range   Color, Urine YELLOW YELLOW   APPearance HAZY (A) CLEAR   Specific Gravity, Urine 1.010 1.005 - 1.030   pH 5.0 5.0 - 8.0   Glucose, UA NEGATIVE NEGATIVE mg/dL   Hgb urine dipstick NEGATIVE NEGATIVE   Bilirubin Urine NEGATIVE NEGATIVE   Ketones, ur NEGATIVE NEGATIVE mg/dL   Protein, ur NEGATIVE NEGATIVE mg/dL   Nitrite NEGATIVE NEGATIVE   Leukocytes,Ua NEGATIVE NEGATIVE    Comment: Performed at [facility], [address] [place], [place] [address]  Basic metabolic panel     Status: Abnormal   Collection Time: 12/04/23  5:05 AM  Result Value Ref Range   Sodium 141 135 - 145 mmol/L   Potassium 3.5 3.5 - 5.1 mmol/L   Chloride 105 98 - 111 mmol/L   CO2 25 22 - 32 mmol/L   Glucose, Bld 98 70 - 99 mg/dL    Comment: Glucose reference range applies only to samples taken after fasting for at least 8 hours.   BUN 16 8 - 23 mg/dL   Creatinine, Ser 8.92 (H) 0.44 - 1.00 mg/dL   Calcium  8.8 (L) 8.9 - 10.3 mg/dL   GFR, Estimated 54 (L) >60 mL/min    Comment: (NOTE) Calculated using the CKD-EPI Creatinine Equation (2021)    Anion gap 11 5 - 15     Comment: Performed at [facility], [address] [place], [place] [address]  CBC     Status: Abnormal   Collection Time: 12/04/23  5:05 AM  Result Value Ref Range   WBC 8.6 4.0 - 10.5 K/uL   RBC 2.52 (L) 3.87 - 5.11 MIL/uL   Hemoglobin 7.4 (L) 12.0 - 15.0 g/dL   HCT 78.1 (L) 63.9 - 53.9 %   MCV 86.5 80.0 - 100.0 fL   MCH 29.4 26.0 - 34.0 pg   MCHC 33.9 30.0 - 36.0 g/dL   RDW 83.6 (H) 88.4 - 84.4 %   Platelets 424 (H) 150 - 400 K/uL   nRBC 0.0 0.0 - 0.2 %    Comment: Performed at [facility], [address], [place], [place] [address]  Protime-INR     Status: None   Collection Time: 12/04/23 10:59 AM  Result Value Ref Range   Prothrombin Time 14.2 11.4 - 15.2 seconds   INR 1.0 0.8 - 1.2    Comment: (NOTE) INR goal varies based on device and disease states. Performed at [facility], [address] [place], [place] [address]   Aerobic/Anaerobic Culture w Gram Stain (surgical/deep wound)  Status: None (Preliminary result)   Collection Time: 12/04/23  1:50 PM   Specimen: Abscess  Result Value Ref Range   Specimen Description      ABSCESS Performed at [facility], [address], [place], [place] [address]    Special Requests      NONE Performed at [facility], [address] [place], [place] [address]    Gram Stain      ABUNDANT WBC PRESENT, PREDOMINANTLY PMN ABUNDANT GRAM POSITIVE COCCI IN PAIRS IN CHAINS Performed at [facility], [address], [place], [place] [address]    Culture PENDING    Report Status PENDING   CBC with Differential/Platelet     Status: Abnormal   Collection Time: 12/05/23 12:32 AM  Result Value Ref Range   WBC 7.9 4.0 - 10.5 K/uL   RBC 2.31 (L) 3.87 - 5.11 MIL/uL   Hemoglobin 6.7 (LL) 12.0 - 15.0 g/dL    Comment: REPEATED TO VERIFY This critical result has been called to ELOUISE ORN RN by [facility] AbdulHalim on 12/05/2023 05:22:16,  and has been read back.    HCT 19.6 (L) 36.0 - 46.0 %   MCV 84.8 80.0 - 100.0 fL   MCH 29.0 26.0 - 34.0 pg   MCHC 34.2 30.0 - 36.0 g/dL   RDW 82.9 (H) 88.4 - 84.4 %   Platelets 426 (H) 150 - 400 K/uL   nRBC 0.0 0.0 - 0.2 %   Neutrophils Relative % 68 %   Neutro Abs 5.4 1.7 - 7.7 K/uL   Lymphocytes Relative 14 %   Lymphs Abs 1.1 0.7 - 4.0 K/uL   Monocytes Relative 12 %   Monocytes Absolute 1.0 0.1 - 1.0 K/uL   Eosinophils Relative 4 %   Eosinophils Absolute 0.3 0.0 - 0.5 K/uL   Basophils Relative 1 %   Basophils Absolute 0.1 0.0 - 0.1 K/uL   WBC Morphology MORPHOLOGY UNREMARKABLE    Smear Review Normal platelet morphology    Immature Granulocytes 1 %   Abs Immature Granulocytes 0.08 (H) 0.00 - 0.07 K/uL   Target Cells PRESENT     Comment: Performed at [facility], [address] [place], [place] [address]  Basic metabolic panel     Status: Abnormal   Collection Time: 12/05/23 12:32 AM  Result Value Ref Range   Sodium 142 135 - 145 mmol/L   Potassium 3.3 (L) 3.5 - 5.1 mmol/L   Chloride 107 98 - 111 mmol/L   CO2 25 22 - 32 mmol/L   Glucose, Bld 94 70 - 99 mg/dL    Comment: Glucose reference range applies only to samples taken after fasting for at least 8 hours.   BUN 15 8 - 23 mg/dL   Creatinine, Ser 8.95 (H) 0.44 - 1.00 mg/dL   Calcium  8.6 (L) 8.9 - 10.3 mg/dL   GFR, Estimated 55 (L) >60 mL/min    Comment: (NOTE) Calculated using the CKD-EPI Creatinine Equation (2021)    Anion gap 11 5 - 15    Comment: Performed at [facility], [address], [place], [place] [address]  Magnesium      Status: Abnormal   Collection Time: 12/05/23 12:32 AM  Result Value Ref Range   Magnesium  1.6 (L) 1.7 - 2.4 mg/dL    Comment: Performed at [facility], [address] [place], [place] [address]     Imaging / Studies: IR US  Guide Bx Asp/Drain Result Date:  12/04/2023 INDICATION: 78 year old female with postoperative parastomal  fluid collection in the anterior abdominal wall. EXAM: US  IMAGE GUIDED DRAINAGE BY PERCUTANEOUS CATHETE MEDICATIONS: The patient is currently admitted to the hospital and receiving intravenous antibiotics. The antibiotics were administered within an appropriate time frame prior to the initiation of the procedure. ANESTHESIA/SEDATION: Fentanyl  2 mcg IV; Versed  100 mg IV administered by the radiology nurse Moderate Sedation Time:  12 minutes The patient's vital signs and level of consciousness were continuously monitored during the procedure by the interventional radiology nurse under my direct supervision. COMPLICATIONS: None immediate. PROCEDURE: Informed written consent was obtained from the patient after a thorough discussion of the procedural risks, benefits and alternatives. All questions were addressed. Maximal Sterile Barrier Technique was utilized including caps, mask, sterile gowns, sterile gloves, sterile drape, hand hygiene and skin antiseptic. A timeout was performed prior to the initiation of the procedure. Ultrasound was used to interrogate the anterior abdominal wall. A large complex fluid collection can be identified surrounding the stoma. Fluid also tracks into the peritoneal space around the liver. A suitable skin entry site was selected and marked. The skin was sterilely prepped and draped in the standard fashion using chlorhexidine  skin prep. Local anesthesia was attained by infiltration with 1% lidocaine . A small dermatotomy was made. Under real-time ultrasound guidance, an 18 gauge trocar needle was advanced to the fluid collection. A 0.035 wire was advanced into the collection and the trocar needle was removed. The percutaneous tract was dilated to 63 French and a 12 French skater drainage catheter was advanced over the wire and formed. Aspiration was then performed yielding 250 mL thick tan colored purulent fluid. Samples were sent for Gram stain and culture. Next, ultrasound was again used to  interrogate the abdominal wall. Not only is the superficial parastomal fluid collection decompressed, but the perihepatic fluid collection has also decompressed. These fluid collections are clearly communicating. Therefore, no second drainage catheter was placed. The drainage catheter was then flushed and connected to JP bulb suction before being secured to the skin with 0 Prolene suture. IMPRESSION: Successful placement of a 12 French drainage catheter into the parastomal fluid collection with evacuation of 250 mL purulent fluid. Ultrasound confirms that both the parastomal and intraperitoneal perihepatic fluid collections decompressed through the single drainage catheter. Specimens were sent for Gram stain and culture. Electronically Signed   By: Wilkie Lent M.D.   On: 12/04/2023 14:32   CT ABDOMEN PELVIS WO CONTRAST Result Date: 12/03/2023 EXAM: CT ABDOMEN AND PELVIS WITHOUT CONTRAST 12/03/2023 09:44:19 PM TECHNIQUE: CT of the abdomen and pelvis was performed without the administration of intravenous contrast. Multiplanar reformatted images are provided for review. Automated exposure control, iterative reconstruction, and/or weight-based adjustment of the mA/kV was utilized to reduce the radiation dose to as low as reasonably achievable. COMPARISON: Prior examination of 11/08/2023. Remote prior examination of 01/12/2015. CLINICAL HISTORY: Abdominal pain, acute, nonlocalized; upper abd pain around colostomy site, drainage, hx of prior abscess. FINDINGS: LOWER CHEST: Scattered bibasilar ground glass opacities have developed, likely reflecting atelectasis. Extensive coronary artery calcifications. Clinical cardiac size within normal limits. Hypoattenuation of the cardiac blood pool in keeping with at least moderate anemia. LIVER: Pneumovila again noted within the liver suggesting changes of prior sphincterotomy. No intra- or extrahepatic biliary ductal dilation. Liver otherwise unremarkable. GALLBLADDER AND  BILE DUCTS: Cholelithiasis without superimposed pericholecystic inflammatory change. No biliary ductal dilatation. SPLEEN: No acute abnormality. PANCREAS: No acute abnormality. ADRENAL GLANDS: Stable 18 mm nodule within the right adrenal gland,  likely an adrenal adenoma and minimally enlarged when compared to remote prior examination of 01/12/2015, where this measured 16 mm. The left adrenal gland is unremarkable. KIDNEYS, URETERS AND BLADDER: Kidneys were unremarkable. Bladder unremarkable. No stones in the kidneys or ureters. No hydronephrosis. No perinephric or periureteral stranding. GI AND BOWEL: Surgical changes of proctocolectomy and right lower quadrant end ileostomy are identified. No evidence of obstruction. Stomach demonstrates no acute abnormality. PERITONEUM AND RETROPERITONEUM: No free intraperitoneal gas or fluid. VASCULATURE: Aorta is normal in caliber. No aortic aneurysm. LYMPH NODES: No lymphadenopathy. REPRODUCTIVE ORGANS: No acute abnormality. BONES AND SOFT TISSUES: Severe lumbar levoscoliosis again noted. No acute bone abnormality. Since the prior examination, the lobulated fluid collection surrounding the ileostomy has enlarged, now measuring up to 5.7 x 12.5 cm within the subcutaneous soft tissues surrounding the terminal ileum and, superiorly, measuring 2.3 x 14.1 cm subjacent to the right upper quadrant anterior abdominal wall. Both these collections appear enlarged since the prior examination. There is, additionally, increasing punctate gas seen within this collections suggesting superinfection and / or enteric communication. There is relative hyperdensity involving several components of this fluid collection suggest that this may be in part related to hemorrhagic fluid. Lobulated fluid appears to track into the subcutaneous soft tissues of the right lateral abdominal wall through the abdominal wall musculature which may reflect dehiscence of a laparoscopic port incision. IMPRESSION: 1.  Multiple lobulated fluid collections surrounding the ileostomy with increasing punctate gas, suggestive of superinfection and/or enteric communication, and relative hyperdensity suggestive of hemorrhagic fluid. These collections have enlarged since the prior examination. 2. Surgical changes of total proctocolectomy and right lower quadrant end ileostomy. 3. Cholelithiasis without superimposed pericholecystic inflammatory change. Electronically signed by: Dorethia Molt MD 12/03/2023 10:17 PM EST RP Workstation: [id number]     Medications / Allergies: per chart  Antibiotics: Anti-infectives (From admission, onward)    Start     Dose/Rate Route Frequency Ordered Stop   12/03/23 2300  piperacillin -tazobactam (ZOSYN ) IVPB 3.375 g        3.375 g 12.5 mL/hr over 240 Minutes Intravenous Every 8 hours 12/03/23 2247           Note: Portions of this report may have been transcribed using voice recognition software. Every effort was made to ensure accuracy; however, inadvertent computerized transcription errors may be present.   Any transcriptional errors that result from this process are unintentional.    Elspeth KYM Schultze, MD, FACS, MASCRS Esophageal, Gastrointestinal & Colorectal Surgery Robotic and Minimally Invasive Surgery  [facility] [address], [address] [place], [place] [address] [phone] Fax [phone] Main  CONTACT INFORMATION: Weekday (9AM-5PM): Call [facility] main office at [phone] Weeknight (5PM-9AM) or Weekend/Holiday: Check EPIC Web Links tab & use AMION (password  [email or web address]) for General Surgery [facility] coverage  Please, DO NOT use SecureChat  (it is not reliable communication to reach operating surgeons & will lead to a delay in care).   Epic staff messaging available for outpatient concerns needing 1-2 business day response.      12/05/2023  7:37 AM

## 2023-12-05 NOTE — Progress Notes (Signed)
 Referring Physician(s): Sheldon, Elspeth  Supervising Physician: Philip Cornet  Patient Status:  [facility] - In-pt  Chief Complaint: post operative fluid collections s/p 12 Fr drain placement 12/04/2023 by Dr. Karalee  Subjective: Patient lying in bed. No concerns with drain, tolerating well. States she is going to receive a blood transfusion today.   Allergies: Ozempic (0.25 or 0.5 mg-dose) [semaglutide(0.25 or 0.5mg -dos)], Ezetimibe, Gabapentin , Iodinated contrast media, Mesalamine, Oxycodone , Prilosec [omeprazole ], and Statins  Medications: Prior to Admission medications   Medication Sig Start Date End Date Taking? Authorizing Provider  acetaminophen  (TYLENOL ) 500 MG tablet Take 1,000 mg by mouth every 6 (six) hours as needed for mild pain or headache.   Yes [provider]  amLODipine  (NORVASC ) 10 MG tablet Take 10 mg by mouth at bedtime. 05/20/18  Yes [provider]  aspirin  EC 81 MG tablet Take 1 tablet (81 mg total) by mouth daily. Swallow whole. 03/20/21  Yes Patwardhan, Manish J, MD  B Complex Vitamins (B COMPLEX 100 PO) Take 1 tablet by mouth daily.   Yes [provider]  benazepril  (LOTENSIN ) 40 MG tablet Take 40 mg by mouth at bedtime. 05/14/18  Yes [provider]  calcium  carbonate (OS-CAL - DOSED IN MG OF ELEMENTAL CALCIUM ) 1250 (500 Ca) MG tablet Take 2 tablets by mouth 2 (two) times daily with a meal.   Yes [provider]  cyclobenzaprine  (FLEXERIL ) 5 MG tablet Take 1 tablet (5 mg total) by mouth 3 (three) times daily as needed for muscle spasms. 10/18/23  Yes Sheldon Elspeth, MD  Evolocumab  (REPATHA  SURECLICK) 140 MG/ML SOAJ Inject 140 mg (1 pen) into the skin every 14 days 07/08/23  Yes Patwardhan, Manish J, MD  famotidine  (PEPCID ) 20 MG tablet Take 20 mg by mouth daily.   Yes [provider]  hydrochlorothiazide  (HYDRODIURIL ) 25 MG tablet Take 25 mg by mouth daily.   Yes [provider]  labetalol  (NORMODYNE ) 100 MG  tablet TAKE 1 TABLET TWICE DAILY (NEED TO KEEP UPCOMING APPOINTMENT IN APRIL TO PREVENT ANY MISSED DOSES) Patient taking differently: Take 100 mg by mouth 2 (two) times daily. 05/03/23  Yes Patwardhan, Manish J, MD  latanoprost  (XALATAN ) 0.005 % ophthalmic solution Place 1 drop into both eyes at bedtime. 05/05/22  Yes [provider]  loperamide  (IMODIUM ) 2 MG capsule Take 1 capsule (2 mg total) by mouth 2 (two) times daily. Patient taking differently: Take 4 mg by mouth in the morning, at noon, and at bedtime. 06/04/19  Yes Teresa Lonni HERO, MD  Menthol -Methyl Salicylate (MUSCLE RUB) 10-15 % CREA Apply 1 application topically 2 (two) times daily as needed for muscle pain.   Yes [provider]  metoCLOPramide  (REGLAN ) 5 MG tablet Take 1 tablet (5 mg total) by mouth every 8 (eight) hours as needed for nausea or vomiting. 10/31/23  Yes Sheldon Elspeth, MD  Multiple Vitamin (MULTIVITAMIN WITH MINERALS) TABS tablet Take 1 tablet by mouth daily. Woman's one a day   Yes [provider]  pantoprazole  (PROTONIX ) 40 MG tablet Take 40 mg by mouth 2 (two) times daily before a meal.    Yes [provider]  polycarbophil (FIBERCON) 625 MG tablet Take 1 tablet (625 mg total) by mouth 2 (two) times daily. 11/12/23 12/12/23 Yes Patsy Lenis, MD  psyllium (HYDROCIL/METAMUCIL) 95 % PACK Take 1 packet by mouth daily. 10/30/23  Yes Sheldon Elspeth, MD  timolol  (TIMOPTIC ) 0.5 % ophthalmic solution Place 1 drop into both eyes every morning. 06/17/22  Yes  [provider]  traMADol  (ULTRAM ) 50 MG tablet Take 1 tablet (50 mg total) by mouth every 6 (six) hours as needed for moderate pain (pain score 4-6) or severe pain (pain score 7-10). 11/12/23  Yes Patsy Lenis, MD  Vitamins-Lipotropics (COMPLEX B-100-INOSITOL) TBCR Take 1 tablet by mouth daily. 11/20/23  Yes [provider]  Multiple Vitamins-Iron  (MULTIVITAMINS WITH IRON ) TABS tablet Take 1 tablet by mouth daily.  11/13/23   Patsy Lenis, MD  traZODone  (DESYREL ) 50 MG tablet Take 50 mg by mouth at bedtime. 02/11/23   [provider]     Vital Signs: BP (!) 128/54 (BP Location: Left Arm)   Pulse 73   Temp 98.2 F (36.8 C)   Resp 18   Ht 5' 3 (1.6 m)   Wt 166 lb (75.3 kg)   SpO2 100%   BMI 29.41 kg/m   Physical Exam Constitutional:      General: She is not in acute distress. Cardiovascular:     Rate and Rhythm: Normal rate.  Pulmonary:     Effort: Pulmonary effort is normal. No respiratory distress.  Abdominal:     Palpations: Abdomen is soft.     Tenderness: There is abdominal tenderness.     Comments: Severe tenderness to right abdomen, near ileostomy site, on very light palpation  Left mid abdominal abscess drain- Serous drainage with scant sediment in bulb Non-tender at drain site Flushes/aspirates easily Insertion site unremarkable Suture in place Dressed appropriately  Skin:    Findings: No erythema.  Neurological:     Mental Status: She is alert and oriented to person, place, and time.  Psychiatric:        Mood and Affect: Mood normal.        Behavior: Behavior normal.     Imaging: IR US  Guide Bx Asp/Drain Result Date: 12/04/2023 INDICATION: 78 year old female with postoperative parastomal fluid collection in the anterior abdominal wall. EXAM: US  IMAGE GUIDED DRAINAGE BY PERCUTANEOUS CATHETE MEDICATIONS: The patient is currently admitted to the hospital and receiving intravenous antibiotics. The antibiotics were administered within an appropriate time frame prior to the initiation of the procedure. ANESTHESIA/SEDATION: Fentanyl  2 mcg IV; Versed  100 mg IV administered by the radiology nurse Moderate Sedation Time:  12 minutes The patient's vital signs and level of consciousness were continuously monitored during the procedure by the interventional radiology nurse under my direct supervision. COMPLICATIONS: None immediate. PROCEDURE: Informed written consent was  obtained from the patient after a thorough discussion of the procedural risks, benefits and alternatives. All questions were addressed. Maximal Sterile Barrier Technique was utilized including caps, mask, sterile gowns, sterile gloves, sterile drape, hand hygiene and skin antiseptic. A timeout was performed prior to the initiation of the procedure. Ultrasound was used to interrogate the anterior abdominal wall. A large complex fluid collection can be identified surrounding the stoma. Fluid also tracks into the peritoneal space around the liver. A suitable skin entry site was selected and marked. The skin was sterilely prepped and draped in the standard fashion using chlorhexidine  skin prep. Local anesthesia was attained by infiltration with 1% lidocaine . A small dermatotomy was made. Under real-time ultrasound guidance, an 18 gauge trocar needle was advanced to the fluid collection. A 0.035 wire was advanced into the collection and the trocar needle was removed. The percutaneous tract was dilated to 86 French and a 12 French skater drainage catheter was advanced over the wire and formed. Aspiration was then performed yielding 250 mL thick tan colored purulent fluid. Samples  were sent for Gram stain and culture. Next, ultrasound was again used to interrogate the abdominal wall. Not only is the superficial parastomal fluid collection decompressed, but the perihepatic fluid collection has also decompressed. These fluid collections are clearly communicating. Therefore, no second drainage catheter was placed. The drainage catheter was then flushed and connected to JP bulb suction before being secured to the skin with 0 Prolene suture. IMPRESSION: Successful placement of a 12 French drainage catheter into the parastomal fluid collection with evacuation of 250 mL purulent fluid. Ultrasound confirms that both the parastomal and intraperitoneal perihepatic fluid collections decompressed through the single drainage catheter.  Specimens were sent for Gram stain and culture. Electronically Signed   By: Wilkie Lent M.D.   On: 12/04/2023 14:32   CT ABDOMEN PELVIS WO CONTRAST Result Date: 12/03/2023 EXAM: CT ABDOMEN AND PELVIS WITHOUT CONTRAST 12/03/2023 09:44:19 PM TECHNIQUE: CT of the abdomen and pelvis was performed without the administration of intravenous contrast. Multiplanar reformatted images are provided for review. Automated exposure control, iterative reconstruction, and/or weight-based adjustment of the mA/kV was utilized to reduce the radiation dose to as low as reasonably achievable. COMPARISON: Prior examination of 11/08/2023. Remote prior examination of 01/12/2015. CLINICAL HISTORY: Abdominal pain, acute, nonlocalized; upper abd pain around colostomy site, drainage, hx of prior abscess. FINDINGS: LOWER CHEST: Scattered bibasilar ground glass opacities have developed, likely reflecting atelectasis. Extensive coronary artery calcifications. Clinical cardiac size within normal limits. Hypoattenuation of the cardiac blood pool in keeping with at least moderate anemia. LIVER: Pneumovila again noted within the liver suggesting changes of prior sphincterotomy. No intra- or extrahepatic biliary ductal dilation. Liver otherwise unremarkable. GALLBLADDER AND BILE DUCTS: Cholelithiasis without superimposed pericholecystic inflammatory change. No biliary ductal dilatation. SPLEEN: No acute abnormality. PANCREAS: No acute abnormality. ADRENAL GLANDS: Stable 18 mm nodule within the right adrenal gland, likely an adrenal adenoma and minimally enlarged when compared to remote prior examination of 01/12/2015, where this measured 16 mm. The left adrenal gland is unremarkable. KIDNEYS, URETERS AND BLADDER: Kidneys were unremarkable. Bladder unremarkable. No stones in the kidneys or ureters. No hydronephrosis. No perinephric or periureteral stranding. GI AND BOWEL: Surgical changes of proctocolectomy and right lower quadrant end  ileostomy are identified. No evidence of obstruction. Stomach demonstrates no acute abnormality. PERITONEUM AND RETROPERITONEUM: No free intraperitoneal gas or fluid. VASCULATURE: Aorta is normal in caliber. No aortic aneurysm. LYMPH NODES: No lymphadenopathy. REPRODUCTIVE ORGANS: No acute abnormality. BONES AND SOFT TISSUES: Severe lumbar levoscoliosis again noted. No acute bone abnormality. Since the prior examination, the lobulated fluid collection surrounding the ileostomy has enlarged, now measuring up to 5.7 x 12.5 cm within the subcutaneous soft tissues surrounding the terminal ileum and, superiorly, measuring 2.3 x 14.1 cm subjacent to the right upper quadrant anterior abdominal wall. Both these collections appear enlarged since the prior examination. There is, additionally, increasing punctate gas seen within this collections suggesting superinfection and / or enteric communication. There is relative hyperdensity involving several components of this fluid collection suggest that this may be in part related to hemorrhagic fluid. Lobulated fluid appears to track into the subcutaneous soft tissues of the right lateral abdominal wall through the abdominal wall musculature which may reflect dehiscence of a laparoscopic port incision. IMPRESSION: 1. Multiple lobulated fluid collections surrounding the ileostomy with increasing punctate gas, suggestive of superinfection and/or enteric communication, and relative hyperdensity suggestive of hemorrhagic fluid. These collections have enlarged since the prior examination. 2. Surgical changes of total proctocolectomy and right lower quadrant end ileostomy. 3. Cholelithiasis  without superimposed pericholecystic inflammatory change. Electronically signed by: Dorethia Molt MD 12/03/2023 10:17 PM EST RP Workstation: [id number]    Labs:  CBC: Recent Labs    11/12/23 0517 12/03/23 1646 12/04/23 0505 12/05/23 0032  WBC 6.0 10.1 8.6 7.9  HGB 7.0* 8.0* 7.4* 6.7*   HCT 20.8* 22.8* 21.8* 19.6*  PLT 336 451* 424* 426*    COAGS: Recent Labs    11/09/23 0542 12/04/23 1059  INR 1.0 1.0    BMP: Recent Labs    11/12/23 0518 12/03/23 1646 12/04/23 0505 12/05/23 0032  NA 141 136 141 142  K 4.2 3.6 3.5 3.3*  CL 106 100 105 107  CO2 29 23 25 25   GLUCOSE 93 127* 98 94  BUN 10 21 16 15   CALCIUM  8.9 9.2 8.8* 8.6*  CREATININE 0.99 1.32* 1.07* 1.04*  GFRNONAA 59* 42* 54* 55*    LIVER FUNCTION TESTS: Recent Labs    11/08/23 1114 11/09/23 0542 12/03/23 1646  BILITOT 0.5 0.6 0.4  AST 24 25 29   ALT 18 15 23   ALKPHOS 133* 116 133*  PROT 6.8 6.2* 6.8  ALBUMIN 3.3* 3.0* 3.2*    Assessment and Plan: Post operative fluid collections-  Hgb 6.7 > to receive blood transfusion today Afebrile WBC 7.9  Drain Location: left mid abdomen Size: Fr size: 12 Fr Date of placement: 12/04/2023  Currently to: Drain collection device: suction bulb 24 hour output:  Output by Drain (mL) 12/03/23 0701 - 12/03/23 1900 12/03/23 1901 - 12/04/23 0700 12/04/23 0701 - 12/04/23 1900 12/04/23 1901 - 12/05/23 0700 12/05/23 0701 - 12/05/23 1227  Closed System Drain 1 Left;Midline LLQ Bulb (JP) 12 Fr.   325 50 30    Current examination: Serous drainage with scant sediment in bulb. Non-tender at drain site. Flushes/aspirates easily. Insertion site unremarkable. Suture in place. Dressed appropriately.  Plan: Continue TID flushes with 5 cc NS. Record output Q shift. Dressing changes QD or PRN if soiled.  Call IR APP or on call IR MD if difficulty flushing or sudden change in drain output.  Repeat imaging/possible drain injection once output < 10 mL/QD (excluding flush material). Consideration for drain removal if output is < 10 mL/QD (excluding flush material), pending discussion with the providing surgical service.  Discharge planning: Please contact IR APP or on call IR MD prior to patient d/c to ensure appropriate follow up plans are in place. Typically  patient will follow up with IR clinic 10-14 days post d/c for repeat imaging/possible drain injection. IR scheduler will contact patient with date/time of appointment. Patient will need to flush drain QD with 5 cc NS, record output QD, dressing changes every 2-3 days or earlier if soiled.   IR will continue to follow - please call with questions or concerns.     Electronically Signed: Carola Viramontes B Raeshawn Vo, NP 12/05/2023, 12:15 PM   I spent a total of 15 Minutes at the the patient's bedside AND on the patient's hospital floor or unit, greater than 50% of which was counseling/coordinating care for drain follow up.

## 2023-12-05 NOTE — Progress Notes (Addendum)
 PROGRESS NOTE    Donna [name]  FMW:[id number] DOB: [birth year] DOA: 12/03/2023 PCP: Clarice Nottingham, MD    Chief Complaint  Patient presents with   Post-op Problem    Brief Narrative:  Donna [name] is a 78 y.o. female with medical history significant for ulcerative colitis, colon cancer s/p colectomy with ileostomy, CKD-3A, GERD, DDD, OSA, incarcerated abdominal wall hernia s/p surgery/repair on 10/18/23 who presented to the ED for evaluation of pain and drainage around her abdominal incision site and admitted for intra-abdominal infection.  Patient placed empirically on IV antibiotics.  General surgery consulted.  IR also consulted for drain placement per general surgery.     Assessment & Plan:   Principal Problem:   Intra-abdominal infection Active Problems:   Hypertension   Status post ileostomy (HCC)   History of hernia repair   CKD stage 3a, GFR 45-59 ml/min (HCC)   Generalized weakness   Anemia  #1 intra-abdominal infection/history of incarcerated abdominal wall hernia s/p repair -Patient noted with multiple hospitalizations over the past month for complications related to recent hernia repair. - Patient presented with diffuse abdominal pain x 3 to 4 days prior to admission with increased drainage and pain around surgical site. - CT abdomen and pelvis with multiple loculated fluid collections surrounding the ileostomy with increasing punctuate gas, suggestive of superinfection and/or enteric communication and relative hyperdensity suggestive of hemorrhagic fluid collections that have enlarged since prior examination. - Patient seen in consultation by general surgery who has asked IR to place drains in collections for now and cultures to be obtained. - It is noted per general surgery the last fluid collection aspiration of the abdominal fluid collection 11/8 was consistent with seroma and cultures negative. -Patient seen by IR and patient status post 12 French  drain placement and left mid abdominal region with evacuation of 250 cc of purulent fluid, 12/04/2023. - Continue empiric IV Zosyn . -Will consult with ID tomorrow for antibiotic duration and recommendations. - Per general surgery.    #2  CKD 3A -Creatinine noted at 1.32 which is not significantly different from recent baseline but slightly worse than 0.99 3 weeks ago. - Renal function improved with hydration and creatinine currently at 1.04. - Follow.  3.  Hypertension  - Continue Norvasc  10 mg daily, Lotensin  40 mg daily, labetalol  100 mg twice daily.   4.  Colon cancer status post colectomy with ileostomy -Patient noted with good output from ileostomy bag. - Continue FiberCon, loperamide .  5.  GERD - Protonix , Pepcid .    6.  Chronic leg swelling -Patient noted on admission with 1+ bilateral lower extremity edema on exam unchanged. - TED hose.  7.  Generalized weakness -In the setting of acute illness. - PT/OT.  8.  Anemia - Likely anemia of chronic disease and possible hematoma noted on CT abdomen and pelvis.. - Patient with no overt bleeding. - Anemia panel from 11/08/2023 with iron  level of 15, ferritin of 467, folate of > 20.  Vitamin B12 1740. -Hemoglobin noted at 6.7 this morning. -Transfuse a total of 2 units PRBCs. - Follow H&H. - Transfusion threshold hemoglobin < 7.  9.  Hypophosphatemia/hypomagnesemia/hypokalemia -Magnesium  is 1.6. - Phosphorus is 1.8. - 2 g of IV magnesium  ordered already this morning will order another 2 g. - K-Phos 30 mmol IV x 1. -K. Dur 40 mEq p.o. x 1. - Repeat labs in the AM.   DVT prophylaxis: Lovenox , being held today for procedure. Code Status: Full Family Communication: Updated patient.  No family at bedside. Disposition: TBD  Status is: Inpatient Remains inpatient appropriate because: Severity of illness   Consultants:  General Surgery: Dr. Sheldon 12/04/2023 Interventional radiology: Dr. Karalee 12/04/2023  Procedures:   CT abdomen and pelvis 12/03/2023 Transfused 2 units PRBCs. Placement of 12 French drain via left mid abdominal approach with evacuation of 250 cc of purulent fluid per IR: Dr. Karalee 12/04/2023.  Antimicrobials:  Anti-infectives (From admission, onward)    Start     Dose/Rate Route Frequency Ordered Stop   12/03/23 2300  piperacillin -tazobactam (ZOSYN ) IVPB 3.375 g        3.375 g 12.5 mL/hr over 240 Minutes Intravenous Every 8 hours 12/03/23 2247           Subjective: Patient still with complaints of abdominal pain however pain currently managed on current pain regiment.  Patient stated after drain was placed noticed significant amount of drainage.  No chest pain, no shortness of breath.  Patient denies any overt bleeding.  Objective: Vitals:   12/05/23 1330 12/05/23 1426 12/05/23 1441 12/05/23 1456  BP: (!) 148/61 133/62 (!) 143/78 (!) 141/69  Pulse: 72 72 85 71  Resp: 18 18 18    Temp: 98.3 F (36.8 C) 98.6 F (37 C) 98.6 F (37 C)   TempSrc:  Oral    SpO2: 99%  96%   Weight:      Height:        Intake/Output Summary (Last 24 hours) at 12/05/2023 1602 Last data filed at 12/05/2023 1330 Gross per 24 hour  Intake 683 ml  Output 180 ml  Net 503 ml   Filed Weights   12/03/23 1640  Weight: 75.3 kg    Examination:  General exam: Appears calm and comfortable  Respiratory system: Clear to auscultation.  No wheezes, no crackles, no rhonchi.  Fair air movement.  Speaking in full sentences.  No use of accessory muscles of respiration.  Respiratory effort normal. Cardiovascular system: RRR no murmurs rubs or gallops.  No JVD.  No lower extremity edema.  Gastrointestinal system: Abdomen is soft, nondistended, diffusely tender to palpation.  Ileostomy bag with stool noted.  JP drain in place with some purulent drainage noted.  Central nervous system: Alert and oriented. No focal neurological deficits. Extremities: Symmetric 5 x 5 power. Skin: No rashes, lesions or  ulcers Psychiatry: Judgement and insight appear normal. Mood & affect appropriate.     Data Reviewed: I have personally reviewed following labs and imaging studies  CBC: Recent Labs  Lab 12/03/23 1646 12/04/23 0505 12/05/23 0032  WBC 10.1 8.6 7.9  NEUTROABS  --   --  5.4  HGB 8.0* 7.4* 6.7*  HCT 22.8* 21.8* 19.6*  MCV 84.4 86.5 84.8  PLT 451* 424* 426*    Basic Metabolic Panel: Recent Labs  Lab 12/03/23 1646 12/04/23 0505 12/05/23 0032  NA 136 141 142  K 3.6 3.5 3.3*  CL 100 105 107  CO2 23 25 25   GLUCOSE 127* 98 94  BUN 21 16 15   CREATININE 1.32* 1.07* 1.04*  CALCIUM  9.2 8.8* 8.6*  MG  --   --  1.6*  PHOS  --   --  1.8*    GFR: Estimated Creatinine Clearance: 44.8 mL/min (A) (by C-G formula based on SCr of 1.04 mg/dL (H)).  Liver Function Tests: Recent Labs  Lab 12/03/23 1646  AST 29  ALT 23  ALKPHOS 133*  BILITOT 0.4  PROT 6.8  ALBUMIN 3.2*    CBG: No results for input(s):  GLUCAP in the last 168 hours.   Recent Results (from the past 240 hours)  Aerobic/Anaerobic Culture w Gram Stain (surgical/deep wound)     Status: None (Preliminary result)   Collection Time: 12/04/23  1:50 PM   Specimen: Abscess  Result Value Ref Range Status   Specimen Description   Final    ABSCESS Performed at [facility], [address], [place], [place] [address]    Special Requests   Final    NONE Performed at [facility], [address] [place], [place] [address]    Gram Stain   Final    ABUNDANT WBC PRESENT, PREDOMINANTLY PMN ABUNDANT GRAM POSITIVE COCCI IN PAIRS IN CHAINS    Culture   Final    FEW KLEBSIELLA PNEUMONIAE SUBCULTURING FOR SUSCEPTIBILITIES MODERATE STREPTOCOCCUS ANGINOSIS SUSCEPTIBILITIES TO FOLLOW Performed at [facility], [address] [place], [place] [address]    Report Status PENDING  Incomplete         Radiology Studies: IR US  Guide Bx Asp/Drain Result Date:  12/04/2023 INDICATION: 78 year old female with postoperative parastomal fluid collection in the anterior abdominal wall. EXAM: US  IMAGE GUIDED DRAINAGE BY PERCUTANEOUS CATHETE MEDICATIONS: The patient is currently admitted to the hospital and receiving intravenous antibiotics. The antibiotics were administered within an appropriate time frame prior to the initiation of the procedure. ANESTHESIA/SEDATION: Fentanyl  2 mcg IV; Versed  100 mg IV administered by the radiology nurse Moderate Sedation Time:  12 minutes The patient's vital signs and level of consciousness were continuously monitored during the procedure by the interventional radiology nurse under my direct supervision. COMPLICATIONS: None immediate. PROCEDURE: Informed written consent was obtained from the patient after a thorough discussion of the procedural risks, benefits and alternatives. All questions were addressed. Maximal Sterile Barrier Technique was utilized including caps, mask, sterile gowns, sterile gloves, sterile drape, hand hygiene and skin antiseptic. A timeout was performed prior to the initiation of the procedure. Ultrasound was used to interrogate the anterior abdominal wall. A large complex fluid collection can be identified surrounding the stoma. Fluid also tracks into the peritoneal space around the liver. A suitable skin entry site was selected and marked. The skin was sterilely prepped and draped in the standard fashion using chlorhexidine  skin prep. Local anesthesia was attained by infiltration with 1% lidocaine . A small dermatotomy was made. Under real-time ultrasound guidance, an 18 gauge trocar needle was advanced to the fluid collection. A 0.035 wire was advanced into the collection and the trocar needle was removed. The percutaneous tract was dilated to 39 French and a 12 French skater drainage catheter was advanced over the wire and formed. Aspiration was then performed yielding 250 mL thick tan colored purulent fluid. Samples  were sent for Gram stain and culture. Next, ultrasound was again used to interrogate the abdominal wall. Not only is the superficial parastomal fluid collection decompressed, but the perihepatic fluid collection has also decompressed. These fluid collections are clearly communicating. Therefore, no second drainage catheter was placed. The drainage catheter was then flushed and connected to JP bulb suction before being secured to the skin with 0 Prolene suture. IMPRESSION: Successful placement of a 12 French drainage catheter into the parastomal fluid collection with evacuation of 250 mL purulent fluid. Ultrasound confirms that both the parastomal and intraperitoneal perihepatic fluid collections decompressed through the single drainage catheter. Specimens were sent for Gram stain and culture. Electronically Signed   By: Wilkie Lent M.D.   On: 12/04/2023 14:32   CT  ABDOMEN PELVIS WO CONTRAST Result Date: 12/03/2023 EXAM: CT ABDOMEN AND PELVIS WITHOUT CONTRAST 12/03/2023 09:44:19 PM TECHNIQUE: CT of the abdomen and pelvis was performed without the administration of intravenous contrast. Multiplanar reformatted images are provided for review. Automated exposure control, iterative reconstruction, and/or weight-based adjustment of the mA/kV was utilized to reduce the radiation dose to as low as reasonably achievable. COMPARISON: Prior examination of 11/08/2023. Remote prior examination of 01/12/2015. CLINICAL HISTORY: Abdominal pain, acute, nonlocalized; upper abd pain around colostomy site, drainage, hx of prior abscess. FINDINGS: LOWER CHEST: Scattered bibasilar ground glass opacities have developed, likely reflecting atelectasis. Extensive coronary artery calcifications. Clinical cardiac size within normal limits. Hypoattenuation of the cardiac blood pool in keeping with at least moderate anemia. LIVER: Pneumovila again noted within the liver suggesting changes of prior sphincterotomy. No intra- or  extrahepatic biliary ductal dilation. Liver otherwise unremarkable. GALLBLADDER AND BILE DUCTS: Cholelithiasis without superimposed pericholecystic inflammatory change. No biliary ductal dilatation. SPLEEN: No acute abnormality. PANCREAS: No acute abnormality. ADRENAL GLANDS: Stable 18 mm nodule within the right adrenal gland, likely an adrenal adenoma and minimally enlarged when compared to remote prior examination of 01/12/2015, where this measured 16 mm. The left adrenal gland is unremarkable. KIDNEYS, URETERS AND BLADDER: Kidneys were unremarkable. Bladder unremarkable. No stones in the kidneys or ureters. No hydronephrosis. No perinephric or periureteral stranding. GI AND BOWEL: Surgical changes of proctocolectomy and right lower quadrant end ileostomy are identified. No evidence of obstruction. Stomach demonstrates no acute abnormality. PERITONEUM AND RETROPERITONEUM: No free intraperitoneal gas or fluid. VASCULATURE: Aorta is normal in caliber. No aortic aneurysm. LYMPH NODES: No lymphadenopathy. REPRODUCTIVE ORGANS: No acute abnormality. BONES AND SOFT TISSUES: Severe lumbar levoscoliosis again noted. No acute bone abnormality. Since the prior examination, the lobulated fluid collection surrounding the ileostomy has enlarged, now measuring up to 5.7 x 12.5 cm within the subcutaneous soft tissues surrounding the terminal ileum and, superiorly, measuring 2.3 x 14.1 cm subjacent to the right upper quadrant anterior abdominal wall. Both these collections appear enlarged since the prior examination. There is, additionally, increasing punctate gas seen within this collections suggesting superinfection and / or enteric communication. There is relative hyperdensity involving several components of this fluid collection suggest that this may be in part related to hemorrhagic fluid. Lobulated fluid appears to track into the subcutaneous soft tissues of the right lateral abdominal wall through the abdominal wall  musculature which may reflect dehiscence of a laparoscopic port incision. IMPRESSION: 1. Multiple lobulated fluid collections surrounding the ileostomy with increasing punctate gas, suggestive of superinfection and/or enteric communication, and relative hyperdensity suggestive of hemorrhagic fluid. These collections have enlarged since the prior examination. 2. Surgical changes of total proctocolectomy and right lower quadrant end ileostomy. 3. Cholelithiasis without superimposed pericholecystic inflammatory change. Electronically signed by: Dorethia Molt MD 12/03/2023 10:17 PM EST RP Workstation: [id number]        Scheduled Meds:  sodium chloride    Intravenous Once   acetaminophen   500 mg Oral TID WC & HS   amLODipine   10 mg Oral QHS   aspirin  EC  81 mg Oral Daily   benazepril   40 mg Oral QHS   calcium  carbonate  2 tablet Oral BID WC   famotidine   20 mg Oral Daily   feeding supplement  237 mL Oral BID BM   labetalol   100 mg Oral BID   latanoprost   1 drop Both Eyes QHS   loperamide   2 mg Oral TID   multivitamins with iron   1 tablet Oral  Daily   pantoprazole   40 mg Oral BID AC   polycarbophil  625 mg Oral BID   potassium chloride   40 mEq Oral Daily   sodium chloride  flush  10-40 mL Intracatheter Q12H   sodium chloride  flush  5 mL Intracatheter Q8H   timolol   1 drop Both Eyes Daily   Continuous Infusions:  piperacillin -tazobactam (ZOSYN )  IV 3.375 g (12/05/23 1438)     LOS: 2 days    Time spent: 40 minutes    Toribio Hummer, MD [facility]   To contact the attending provider between 7A-7P or the covering provider during after hours 7P-7A, please log into the web site [email or web address] and access using universal [place] password for that web site. If you do not have the password, please call the hospital operator.  12/05/2023, 4:02 PM

## 2023-12-05 NOTE — TOC Initial Note (Signed)
 Transition of Care [facility]) - Initial/Assessment Note    Patient Details  Name: Donna [name] MRN: [id number] Date of Birth: 1945/06/15  [facility] CM/SW Contact:    Donna Manuella Quill, RN Phone Number: 12/05/2023, 5:16 PM  Clinical Narrative:                 Donna [name] w/ pt and family in room; pt said she lives at home w/ her son Donna [name] [phone]); she plans to return w/ family support at d/c; family will provide transportation; insurance/PCP verified; pt has cane, walker, shower chair; she also has HHPT w/ Donna [name]; pt said she would like to con't services w/ agency; awaiting PT/OT evals.  Expected Discharge Plan: Home w Home Health Services Barriers to Discharge: Continued Medical Work up   Patient Goals and CMS Choice Patient states their goals for this hospitalization and ongoing recovery are:: home     [place] ownership interest in [facility].provided to:: Patient    Expected Discharge Plan and Services   Discharge Planning Services: CM Consult   Living arrangements for the past 2 months: Single Family Home                                      Prior Living Arrangements/Services Living arrangements for the past 2 months: Single Family Home Lives with:: Adult Children Patient language and need for interpreter reviewed:: Yes Do you feel safe going back to the place where you live?: Yes      Need for Family Participation in Patient Care: Yes (Comment) Care giver support system in place?: Yes (comment) Current home services: DME (cane, walker, shower chair; HHPT w/ Donna [name]) Criminal Activity/Legal Involvement Pertinent to Current Situation/Hospitalization: No - Comment as needed  Activities of Daily Living   ADL Screening (condition at time of admission) Independently performs ADLs?: No Does the patient have a NEW difficulty with bathing/dressing/toileting/self-feeding that is expected to last >3 days?: Yes (Initiates  electronic notice to provider for possible OT consult) Does the patient have a NEW difficulty with getting in/out of bed, walking, or climbing stairs that is expected to last >3 days?: Yes (Initiates electronic notice to provider for possible PT consult) Does the patient have a NEW difficulty with communication that is expected to last >3 days?: No Is the patient deaf or have difficulty hearing?: No Does the patient have difficulty seeing, even when wearing glasses/contacts?: No Does the patient have difficulty concentrating, remembering, or making decisions?: No  Permission Sought/Granted Permission sought to share information with : Case Manager Permission granted to share information with : Yes, Verbal Permission Granted  Share Information with NAME: Case Manager     Permission granted to share info w Relationship: Donna [name] (son) [phone]     Emotional Assessment Appearance:: Appears stated age Attitude/Demeanor/Rapport: Gracious Affect (typically observed): Accepting Orientation: : Oriented to Self, Oriented to Place, Oriented to  Time, Oriented to Situation Alcohol / Substance Use: Not Applicable Psych Involvement: No (comment)  Admission diagnosis:  Intra-abdominal infection [B99.9] Patient Active Problem List   Diagnosis Date Noted   History of hernia repair 12/04/2023   CKD stage 3a, GFR 45-59 ml/min (HCC) 12/04/2023   Generalized weakness 12/04/2023   Anemia 12/04/2023   Intra-abdominal infection 12/03/2023   Acute renal failure superimposed on stage 3a chronic kidney disease (HCC) 11/10/2023   Abdominal wall fluid collection - probable hematoma 11/09/2023  AKI (acute kidney injury) 11/08/2023   Hypokalemia 10/28/2023   Pure hypercholesterolemia 10/18/2023   Prediabetes 10/18/2023   Obstructive sleep apnea syndrome 10/18/2023   Status post ileostomy (HCC) 10/18/2023   History of ulcerative colitis 10/18/2023   History of malignant neoplasm of colon 10/18/2023    Stage 3b chronic kidney disease (HCC) 10/18/2023   Sciatica 10/18/2023   Esophageal dysphagia 10/18/2023   Recurrent incisional hernia with incarceration 09/24/2023   Para-ileostomy hernia (HCC) 09/24/2023   Ulcerative colitis (HCC) 09/24/2023   Fatigue 04/26/2022   Dyspnea on exertion 03/20/2021   Elevated coronary artery calcium  score 03/19/2021   S/P total colectomy 05/29/2019   Primary osteoarthritis of left knee 06/20/2018   Hypertension 01/21/2015   Gastroesophageal reflux disease 01/21/2015   Degeneration of lumbar or lumbosacral intervertebral disc 01/21/2015   Hyperlipidemia 01/21/2015   Bilateral carpal tunnel syndrome 01/05/2015   PCP:  Donna Nottingham, MD Pharmacy:   [facility] [address] ([place]), [place] - [address] [place] ([place]) [place] [address] Phone: [phone] Fax: [phone]  [facility] [address] [place] [address] Phone: [phone] Fax: [phone]     Social Drivers of Health (SDOH) Social History: SDOH Screenings   Food Insecurity: No Food Insecurity (12/05/2023)  Recent Concern: Food Insecurity - Food Insecurity Present (12/04/2023)  Housing: Low Risk  (12/05/2023)  Transportation Needs: No Transportation Needs (12/05/2023)  Utilities: Not At Risk (12/05/2023)  Depression (PHQ2-9): Low Risk  (11/03/2021)  Social Connections: Moderately Integrated (12/04/2023)  Tobacco Use: Low Risk  (12/03/2023)   SDOH Interventions: Food Insecurity Interventions: Intervention Not Indicated, Inpatient TOC Housing Interventions: Intervention Not Indicated, Inpatient TOC Transportation Interventions: Intervention Not Indicated, Inpatient TOC Utilities Interventions: Intervention Not Indicated, Inpatient TOC   Readmission Risk Interventions    12/05/2023    5:13 PM 10/22/2023    2:13 PM  Readmission Risk Prevention Plan  Transportation Screening Complete Complete  PCP or  Specialist Appt within 5-7 Days  Complete  PCP or Specialist Appt within 3-5 Days Complete   Home Care Screening  Complete  Medication Review (RN CM)  Complete  HRI or Home Care Consult Complete   Social Work Consult for Recovery Care Planning/Counseling Complete   Palliative Care Screening Not Applicable   Medication Review Oceanographer) Complete

## 2023-12-05 NOTE — Progress Notes (Signed)
 PT Cancellation Note  Patient Details Name: Donna [name] Dpwija MRN: [id number] DOB: August 01, 1945   Cancelled Treatment:    Reason Eval/Treat Not Completed: Medical issues which prohibited therapy Blood products continue to infuse. Will check back tomorrow. Darice Potters PT Acute Rehabilitation Services Office [phone]   Potters Darice Norris 12/05/2023, 3:34 PM

## 2023-12-06 DIAGNOSIS — K635 Polyp of colon: Secondary | ICD-10-CM | POA: Diagnosis not present

## 2023-12-06 DIAGNOSIS — K651 Peritoneal abscess: Secondary | ICD-10-CM

## 2023-12-06 DIAGNOSIS — T8579XA Infection and inflammatory reaction due to other internal prosthetic devices, implants and grafts, initial encounter: Secondary | ICD-10-CM

## 2023-12-06 DIAGNOSIS — G4733 Obstructive sleep apnea (adult) (pediatric): Secondary | ICD-10-CM

## 2023-12-06 DIAGNOSIS — Z9889 Other specified postprocedural states: Secondary | ICD-10-CM | POA: Diagnosis not present

## 2023-12-06 DIAGNOSIS — E860 Dehydration: Secondary | ICD-10-CM | POA: Diagnosis not present

## 2023-12-06 DIAGNOSIS — Z932 Ileostomy status: Secondary | ICD-10-CM | POA: Diagnosis not present

## 2023-12-06 DIAGNOSIS — B999 Unspecified infectious disease: Secondary | ICD-10-CM | POA: Diagnosis not present

## 2023-12-06 DIAGNOSIS — N1831 Chronic kidney disease, stage 3a: Secondary | ICD-10-CM | POA: Diagnosis not present

## 2023-12-06 LAB — CBC WITH DIFFERENTIAL/PLATELET
Abs Immature Granulocytes: 0.15 K/uL — ABNORMAL HIGH (ref 0.00–0.07)
Basophils Absolute: 0.1 K/uL (ref 0.0–0.1)
Basophils Relative: 1 %
Eosinophils Absolute: 0.4 K/uL (ref 0.0–0.5)
Eosinophils Relative: 5 %
HCT: 26.9 % — ABNORMAL LOW (ref 36.0–46.0)
Hemoglobin: 9.4 g/dL — ABNORMAL LOW (ref 12.0–15.0)
Immature Granulocytes: 2 %
Lymphocytes Relative: 14 %
Lymphs Abs: 1.2 K/uL (ref 0.7–4.0)
MCH: 29.5 pg (ref 26.0–34.0)
MCHC: 34.9 g/dL (ref 30.0–36.0)
MCV: 84.3 fL (ref 80.0–100.0)
Monocytes Absolute: 0.9 K/uL (ref 0.1–1.0)
Monocytes Relative: 10 %
Neutro Abs: 6.1 K/uL (ref 1.7–7.7)
Neutrophils Relative %: 68 %
Platelets: 352 K/uL (ref 150–400)
RBC: 3.19 MIL/uL — ABNORMAL LOW (ref 3.87–5.11)
RDW: 16.2 % — ABNORMAL HIGH (ref 11.5–15.5)
WBC: 8.8 K/uL (ref 4.0–10.5)
nRBC: 0 % (ref 0.0–0.2)

## 2023-12-06 LAB — BASIC METABOLIC PANEL WITH GFR
Anion gap: 10 (ref 5–15)
BUN: 11 mg/dL (ref 8–23)
CO2: 25 mmol/L (ref 22–32)
Calcium: 8.6 mg/dL — ABNORMAL LOW (ref 8.9–10.3)
Chloride: 108 mmol/L (ref 98–111)
Creatinine, Ser: 0.91 mg/dL (ref 0.44–1.00)
GFR, Estimated: >60 mL/min (ref >60)
Glucose, Bld: 93 mg/dL (ref 70–99)
Potassium: 4.3 mmol/L (ref 3.5–5.1)
Sodium: 143 mmol/L (ref 135–145)

## 2023-12-06 LAB — TYPE AND SCREEN
ABO/RH(D): A POS
Antibody Screen: NEGATIVE
Unit division: 0
Unit division: 0

## 2023-12-06 LAB — BPAM RBC
Blood Product Expiration Date: 202512272359
Blood Product Expiration Date: 202512282359
ISSUE DATE / TIME: 202512041030
ISSUE DATE / TIME: 202512041417
Unit Type and Rh: 6200
Unit Type and Rh: 6200

## 2023-12-06 LAB — MAGNESIUM: Magnesium: 2.1 mg/dL (ref 1.7–2.4)

## 2023-12-06 LAB — PREALBUMIN: Prealbumin: 17 mg/dL — ABNORMAL LOW (ref 18–38)

## 2023-12-06 LAB — PHOSPHORUS: Phosphorus: 2.8 mg/dL (ref 2.5–4.6)

## 2023-12-06 MED ORDER — ALUM & MAG HYDROXIDE-SIMETH 200-200-20 MG/5ML PO SUSP
30.0000 mL | Freq: Once | ORAL | Status: AC
  Administered 2023-12-06: 30 mL via ORAL
  Filled 2023-12-06: qty 30

## 2023-12-06 MED ORDER — LIDOCAINE VISCOUS HCL 2 % MT SOLN
15.0000 mL | Freq: Once | OROMUCOSAL | Status: DC
  Filled 2023-12-06: qty 15

## 2023-12-06 NOTE — Discharge Instructions (Addendum)
 DRAIN CARE:   You have a closed bulb drain to help you heal.    Bulb Closed Drain Care  The bulb drainage system has flexible tubing attached to a soft, plastic egg shaped clear plastic bulb with a stopper. The drainage end of the tubing goes into a cavity your body (surgery area, abscess, etc) through a small opening in your skin. A stitch & tape holds the drainage end in place. The rest of the tube is outside your body, attached to a bulb. When the bulb is squeezed down & compressed with the stopper in place, it creates a constant gentle suction.  This helps draw out fluid that collects in the bulb to help the inner cavity to shrink down & heal. The bulb should be compressed at all times, except when you are emptying the drainage.  How long you will have your drain depends on your recovery & the amount & type of fluid is draining. This is different for everyone. The drain & tubing is usually removed when the drainage is 30 mL or less a day. to your follow-up appointments.  Caring for Your DRAIN In order to care for your Jackson-Pratt at home, you or your caregiver will do the following:  Empty the drain at least once a day and record the color and amount of drainage  Care for the area where the tubing enters your skin by washing with soap and water . Place the bulb in a pocket or safety-pin to your clothes to keep it from pulling at the skin  Milk or flush the tubing to keep the tubing from getting plugged up / blocked.   Do this before you empty and measure your drainage. Look in the mirror at the tubing. This will help you see where your hands need to be. Pinch the tubing close to where it goes into your skin between your thumb and forefinger. With the thumb and forefinger of your other hand, pinch the tubing right below your other fingers. Keep your fingers pinched and slide them down the tubing, pushing any clots down toward the bulb. You may want to use alcohol swabs  to help you slide  your fingers down the tubing. Repeat steps 3 and 4 as necessary to push clots from the tubing into the bulb. If you are not able to move a clot into the bulb, call your doctor's office. The fluid may leak around the insertion site if a clot is blocking the drainage flow. If there is fluid in the bulb and no leakage at the insertion site, the drain is working.   Caring for the Insertion Site  Once you have emptied the drainage, clean your hands again.  Check the area around the insertion site:  Redness, swelling, crusting, or yellow scab can form around a drain over time. Usually this is smaller than a dime & stays small  If you have worsening tenderness, swelling, or pus or a fever >101.50F; you may have an infection. Call your doctor's office.  Wash drain site with soap & water  (dilute hydrogen peroxide PRN) daily & replace clean dressing / tape    DAILY DRAIN CARE Keep the bulb compressed at all times, except while emptying it. The compression creates suction to gently drain & closed the cavity .  Keep sites where the tubes enter the skin dry and covered with a light bandage (dressing).  Tape the tubes to your skin, 1 to 2 inches away from the insertion site, to keep from pulling  on your stitches. Tubes are stitched in place and will not slip out.  Pin the bulb to your shirt (not to your pants) with a safety pin.  For the first few days after surgery, there usually is more fluid in the bulb. Empty the bulb whenever it becomes half full because the bulb does not create enough suction if it is too full. Include this amount in your 24 hour totals.  When the amount of drainage decreases, empty the bulb at the same time every day. Write down the amounts and the 24 hour totals. Your caregiver will want to know them. This helps your caregiver know when the tubes can be removed.  (We anticipate removing the drain in 1-3 weeks, depending on when the output is <87mL a day for 2+ days) If there is  drainage around the tube sites, change dressings and keep the area dry. If you see a clot in the tube, leave it alone. However, if the tube does not appear to be draining, let your caregiver know.   TO EMPTY THE BULB Wash your hands before & after Open the stopper to release suction.  Holding the stopper out of the way, pour drainage into the measuring cup that was sent home with you.  Measure and write down the amount. If there are 2 bulbs, note the amount of drainage from bulb 1 or bulb 2 and keep the totals separate.  Compress the bulb by folding it in half. & replace the stopper.  Check the tape that holds the tube to your skin, and pin the bulb to your shirt.    SEEK MEDICAL CARE IF: The drainage develops a bad odor.  You have an oral temperature above 101.5 F (38.5 C).  The amount of drainage from your wound suddenly increases or decreases.  You accidentally pull out your drain.  You have any other questions or concerns.      Call our office if you have any questions about your drain. [phone] Interventional Radiology Percutaneous Abscess Drain Placement After Care   This sheet gives you information about how to care for yourself after your procedure. Your health care provider may also give you more specific instructions. Your drain was placed by an interventional radiologist with [facility]. If you have questions or concerns, contact [facility] at [phone].   What is a percutaneous drain?   A drain is a small plastic tube (catheter) that goes into the fluid collection in your body through your skin.   How long will I need the drain?   How long the drain needs to stay in is determined by where the drain is, how much comes out of the drain each day and if you are having any other surgical procedures.   Interventional radiology will determine when it is time to remove the drain. It is important to follow up as directed so that the drain can be  removed as soon as it is safe to do so.   What can I expect after the procedure?   After the procedure, it is common to have:   A small amount of bruising and discomfort in the area where the drainage tube (catheter) was placed.   Sleepiness and fatigue. This should go away after the medicines you were given have worn off.   Follow these instructions at home:   Insertion site care   Check your insertion site when you change the bandage. Check for:   More redness, swelling, or  pain.   More fluid or blood.   Warmth.   Pus or a bad smell.   When caring for your insertion site:   Wash your hands with soap and water  for at least 20 seconds before and after you change your bandage (dressing). If soap and water  are not available, use hand sanitizer.   You do not need to change your dressing everyday if it is clean and dry. Change your dressing every 3 days or as needed when it is soiled, wet or becoming dislodged. You will need to change your dressing each time you shower.   Leave stitches (sutures), skin glue, or adhesive strips in place. These skin closures may need to stay in place for 2 weeks or longer. If adhesive strip edges start to loosen and curl up, you may trim the loose edges. Do not remove adhesive strips completely unless your health care provider tells you to do so.   Catheter care   Flush the catheter once per day with 5 mL of 0.9% normal saline unless you are told otherwise by your healthcare provider. This helps to prevent clogs in the catheter.   To disconnect the drain, turn the clear plastic tube to the left. Attach the saline syringe by placing it on the white end of the drain and turning gently to the right. Once attached gently push the plunger to the 5 mL mark. After you are done flushing, disconnect the syringe by turning to the left and reattach your drainage container   If you have a bulb please be sure the bulb is charged after reconnecting it - to do this  pinch the bulb between your thumb and first finger and close the stopper located on the top of the bulb.    Check for fluid leaking from around your catheter (instead of fluid draining through your catheter). This may be a sign that the drain is no longer working correctly.   Write down the following information every time you empty your bag:   The date and time.   The amount of drainage.   Activity   Rest at home for 1-2 days after your procedure.   For the first 48 hours do not lift anything more than 10 lbs (about a gallon of milk). You may perform moderate activities/exercise. Please avoid strenuous activities during this time.   Avoid any activities which may pull on your drain as this can cause your drain to become dislodged.   If you were given a sedative during the procedure, it can affect you for several hours. Do not drive or operate machinery until your health care provider says that it is safe.   General instructions   For mild pain take over-the-counter medications as needed for pain such as Tylenol  or Advil . If you are experiencing severe pain please call our office as this may indicate an issue with your drain.    If you were prescribed an antibiotic medicine, take it as told by your health care provider. Do not stop using the antibiotic even if you start to feel better.   You may shower 24 hours after the drain is placed. To do this cover the insertion site with a water  tight material such as saran wrap and seal the edges with tape, you may also purchase waterproof dressings at your local drug store. Shower as usual and then remove the water  tight dressing and any gauze/tape underneath it once you have exited the shower and dried off. Allow the  area to air dry or pat dry with a clean towel. Once the skin is completely dry place a new gauze dressing. It is important to keep the site dry at all times to prevent infection.   Do not submerge the drain - this means you cannot  take baths, swim, use a hot tub, etc. until the drain is removed.    Do not use any products that contain nicotine or tobacco, such as cigarettes, e-cigarettes, and chewing tobacco. If you need help quitting, ask your health care provider.   Keep all follow-up visits as told by your health care provider. This is important.   Contact a health care provider if:   You have less than 10 mL of drainage a day for 2-3 days in a row, or as directed by your health care provider.   You have any of these signs of infection:   More redness, swelling, or pain around your incision area.   More fluid or blood coming from your incision area.   Warmth coming from your incision area.   Pus or a bad smell coming from your incision area.   You have fluid leaking from around your catheter (instead of through your catheter).   You are unable to flush the drain.   You have a fever or chills.   You have pain that does not get better with medicine.   You have not been contacted to schedule a drain follow up appointment within 10 days of discharge from the hospital.   Please call [facility] at [phone] with any questions or concerns.   Get help right away if:   Your catheter comes out.   You suddenly stop having drainage from your catheter.   You suddenly have blood in the fluid that is draining from your catheter.   You become dizzy or you faint.   You develop a rash.   You have nausea or vomiting.   You have difficulty breathing or you feel short of breath.   You develop chest pain.   You have problems with your speech or vision.   You have trouble balancing or moving your arms or legs.   Summary   It is common to have a small amount of bruising and discomfort in the area where the drainage tube (catheter) was placed. You may also have minor discomfort with movement while the drain is in place.   Flush the drain once per day with 5 mL of 0.9% normal saline (unless  you were told otherwise by your healthcare provider).    Record the amount of drainage from the bag every time you empty it.   Change the dressing every 3 days or earlier if soiled/wet. Keep the skin dry under the dressing.   You may shower with the drain in place. Do not submerge the drain (no baths, swimming, hot tubs, etc.).   Contact [facility] at [phone] if you have more redness, swelling, or pain around your incision area or if you have pain that does not get better with medicine.   This information is not intended to replace advice given to you by your health care provider. Make sure you discuss any questions you have with your health care provider.   Document Revised: 03/23/2021 Document Reviewed: 12/13/2018   [employer]vier Patient Education  2023 [employer]         Interventional Radiology Drain Record   Empty your drain at least once per day. You may empty  it as often as needed. Use this form to write down the amount of fluid that has collected in the drainage container. Bring this form with you to your follow-up visits. Please call [facility] at [phone] with any questions or concerns prior to your appointment.   Drain #1 location: ___________________   Date __________ Time __________ Amount __________   Date __________ Time __________ Amount __________   Date __________ Time __________ Amount __________   Date __________ Time __________ Amount __________   Date __________ Time __________ Amount __________   Date __________ Time __________ Amount __________   Date __________ Time __________ Amount __________   Date __________ Time __________ Amount __________   Date __________ Time __________ Amount __________   Date __________ Time __________ Amount __________   Date __________ Time __________ Amount __________   Date __________ Time __________ Amount __________   Date __________ Time __________ Amount __________   Date  __________ Time __________ Amount __________

## 2023-12-06 NOTE — Evaluation (Signed)
 Physical Therapy Evaluation Patient Details Name: Donna [name] Dpwija MRN: [id number] DOB: 1945-12-03 Today's Date: 12/06/2023  History of Present Illness  Pt is a 78 y/o F admitted on 12/03/23 after presenting with c/o pain & drainage around abdominal incision site. Pt admitted for intra-abdominal infection. PMH: ulcerative colitis, colon CA s/p colectomy with ileostomy, CKD 3A, GERD, DDD, OSA, incarcerated abdominal wall hernia s/p surgery/repair on 10/18/23  Clinical Impression  Pt seen for PT evaluation with pt received getting OOB, requesting PT assist her to the bathroom. Pt ambulates in room/bathroom with RW with fair balance, after toileting, ambulated with SPC with decreased balance. Provided pt with RW & pt ambulated longer distance with close supervision<>CGA, 1 standing rest break 2/2 fatigue. Educated pt on recommendation to use RW vs SPC at this time. Recommend ongoing PT services to progress mobility as able.        If plan is discharge home, recommend the following: A little help with walking and/or transfers;A little help with bathing/dressing/bathroom;Assistance with cooking/housework;Assist for transportation;Help with stairs or ramp for entrance   Can travel by private vehicle        Equipment Recommendations None recommended by PT (pt already has RW)  Recommendations for Other Services       Functional Status Assessment Patient has had a recent decline in their functional status and demonstrates the ability to make significant improvements in function in a reasonable and predictable amount of time.     Precautions / Restrictions Precautions Precautions: Fall Precaution/Restrictions Comments: JP drain Restrictions Weight Bearing Restrictions Per Provider Order: No      Mobility  Bed Mobility Overal bed mobility: Modified Independent             General bed mobility comments: Pt received transitioning OOB, L side of bed, HOB elevated, bed rails.     Transfers Overall transfer level: Needs assistance Equipment used: Rolling walker (2 wheels), None Transfers: Sit to/from Stand Sit to Stand: Supervision           General transfer comment: sit>stand from EOB, low toilet in bathroom with cuing re: use of grab bars, hand placement    Ambulation/Gait Ambulation/Gait assistance: Contact guard assist, Supervision Gait Distance (Feet): 10 Feet (+ 12 ft + 150 ft) Assistive device: Rolling walker (2 wheels), Straight cane Gait Pattern/deviations: Decreased step length - right, Step-through pattern, Decreased step length - left, Decreased stride length Gait velocity: decreased     General Gait Details: Pt ambulated room>bathroom with RW, then out of bathroom to door with SPC with decreased balance & increased lateral sway when using SPC. Pt using RW to ambulate into hallway with supervision<>CGA, 1 standing rest break 2/2 fatigue.  Stairs            Wheelchair Mobility     Tilt Bed    Modified Rankin (Stroke Patients Only)       Balance Overall balance assessment: Needs assistance Sitting-balance support: Feet supported Sitting balance-Leahy Scale: Good Sitting balance - Comments: sitting on toilet without LOB   Standing balance support: No upper extremity supported, During functional activity Standing balance-Leahy Scale: Good Standing balance comment: hand hygiene standing at sink without LOB                             Pertinent Vitals/Pain Pain Assessment Pain Assessment: No/denies pain    Home Living Family/patient expects to be discharged to:: Private residence Living Arrangements: Children Available Help at Discharge:  Family;Available 24 hours/day Type of Home: House Home Access: Stairs to enter Entrance Stairs-Rails: Right;Left;Can reach both Entrance Stairs-Number of Steps: 5-6   Home Layout: One level Home Equipment: [employer] (2 wheels);Cane - single point;BSC/3in1;Shower  seat;Grab bars - tub/shower Additional Comments: was receiving HHPT prior to admission    Prior Function               Mobility Comments: ambulatory with [facility]       Extremity/Trunk Assessment   Upper Extremity Assessment Upper Extremity Assessment: Overall WFL for tasks assessed    Lower Extremity Assessment Lower Extremity Assessment: Overall WFL for tasks assessed;Generalized weakness       Communication   Communication Communication: No apparent difficulties    Cognition Arousal: Alert Behavior During Therapy: WFL for tasks assessed/performed   PT - Cognitive impairments: No apparent impairments                         Following commands: Intact       Cueing Cueing Techniques: Verbal cues     General Comments General comments (skin integrity, edema, etc.): Pt emptied ostomy pouch without assistance, continent void on toilet.    Exercises     Assessment/Plan    PT Assessment Patient needs continued PT services  PT Problem List Decreased strength;Decreased activity tolerance;Decreased balance;Decreased mobility;Decreased knowledge of use of DME       PT Treatment Interventions Balance training;DME instruction;Gait training;Neuromuscular re-education;Stair training;Functional mobility training;Patient/family education;Therapeutic activities;Therapeutic exercise;Manual techniques    PT Goals (Current goals can be found in the Care Plan section)  Acute Rehab PT Goals Patient Stated Goal: get better PT Goal Formulation: With patient Time For Goal Achievement: 12/20/23 Potential to Achieve Goals: Good    Frequency Min 2X/week     Co-evaluation               AM-PAC PT 6 Clicks Mobility  Outcome Measure Help needed turning from your back to your side while in a flat bed without using bedrails?: None Help needed moving from lying on your back to sitting on the side of a flat bed without using bedrails?: A Little Help needed moving to  and from a bed to a chair (including a wheelchair)?: A Little Help needed standing up from a chair using your arms (e.g., wheelchair or bedside chair)?: A Little Help needed to walk in hospital room?: A Little Help needed climbing 3-5 steps with a railing? : A Little 6 Click Score: 19    End of Session   Activity Tolerance: Patient tolerated treatment well;Patient limited by fatigue Patient left: in chair;with call bell/phone within reach Nurse Communication: Mobility status PT Visit Diagnosis: Muscle weakness (generalized) (M62.81);Unsteadiness on feet (R26.81)    Time: 8894-8876 PT Time Calculation (min) (ACUTE ONLY): 18 min   Charges:   PT Evaluation $PT Eval Low Complexity: 1 Low   PT General Charges $$ ACUTE PT VISIT: 1 Visit         Richerd Pinal, PT, DPT 12/06/23, 11:29 AM   Richerd CHRISTELLA Pinal 12/06/2023, 11:28 AM

## 2023-12-06 NOTE — Progress Notes (Signed)
 PROGRESS NOTE    Donna [name]  FMW:[id number] DOB: 08-02-1945 DOA: 12/03/2023 PCP: Clarice Nottingham, MD    Chief Complaint  Patient presents with   Post-op Problem    Brief Narrative:  Donna [name] is a 78 y.o. female with medical history significant for ulcerative colitis, colon cancer s/p colectomy with ileostomy, CKD-3A, GERD, DDD, OSA, incarcerated abdominal wall hernia s/p surgery/repair on 10/18/23 who presented to the ED for evaluation of pain and drainage around her abdominal incision site and admitted for intra-abdominal infection.  Patient placed empirically on IV antibiotics.  General surgery consulted.  IR also consulted for drain placement per general surgery.     Assessment & Plan:   Principal Problem:   Intra-abdominal infection Active Problems:   Hypertension   Status post ileostomy (HCC)   History of hernia repair   CKD stage 3a, GFR 45-59 ml/min (HCC)   Generalized weakness   Anemia  #1 intra-abdominal infection/history of incarcerated abdominal wall hernia s/p repair -Patient noted with multiple hospitalizations over the past month for complications related to recent hernia repair. - Patient presented with diffuse abdominal pain x 3 to 4 days prior to admission with increased drainage and pain around surgical site. - CT abdomen and pelvis with multiple loculated fluid collections surrounding the ileostomy with increasing punctuate gas, suggestive of superinfection and/or enteric communication and relative hyperdensity suggestive of hemorrhagic fluid collections that have enlarged since prior examination. - Patient seen in consultation by general surgery who has asked IR to place drains in collections for now and cultures to be obtained. - It is noted per general surgery the last fluid collection aspiration of the abdominal fluid collection 11/8 was consistent with seroma and cultures negative. -Patient seen by IR and patient status post 12 French  drain placement and left mid abdominal region with evacuation of 250 cc of purulent fluid, 12/04/2023. -Preliminary cultures with abundant GPC in pairs and chains. - Continue empiric IV Zosyn . -Patient seen in consultation by ID who are recommending continuation of IV Zosyn  for now, follow-up abscess cultures and feel patient would likely need protracted antibiotics with close follow-up with ID, general surgery and radiology. - Per general surgery.    #2  CKD 3A -Creatinine noted at 1.32 which is not significantly different from recent baseline but slightly worse than 0.99 3 weeks ago. - Renal function improved with hydration and creatinine currently at 0.91. - Follow.  3.  Hypertension  - Continue Norvasc  10 mg daily, Lotensin  40 mg daily, labetalol  100 mg twice daily.    4.  Colon cancer status post colectomy with ileostomy -Patient noted with good output from ileostomy bag. - Continue FiberCon, loperamide .  5.  GERD - Continue Pepcid , Protonix .   6.  Chronic leg swelling -Patient noted on admission with 1+ bilateral lower extremity edema on exam unchanged. - TED hose.  7.  Generalized weakness -In the setting of acute illness. - PT/OT.  8.  Anemia - Likely anemia of chronic disease and possible hematoma noted on CT abdomen and pelvis.. - Patient with no overt bleeding. - Anemia panel from 11/08/2023 with iron  level of 15, ferritin of 467, folate of > 20.  Vitamin B12 1740. -Hemoglobin noted at 6.7 on 12/05/2023.  - Status post transfusion 2 months PRBCs with hemoglobin currently at 9.4 this morning.  - Follow H&H. - Transfusion threshold hemoglobin < 7.  9.  Hypophosphatemia/hypomagnesemia/hypokalemia - Magnesium  at 2.1.   - Phosphorus at 2.8.   - Potassium of  4.3.   - Repeat labs in the AM.    DVT prophylaxis: SCDs.  Code Status: Full Family Communication: Updated patient.  No family at bedside. Disposition: TBD  Status is: Inpatient Remains inpatient appropriate  because: Severity of illness   Consultants:  General Surgery: Dr. Sheldon 12/04/2023 Interventional radiology: Dr. Karalee 12/04/2023 ID: Dr.Van Dam 12/06/2023  Procedures:  CT abdomen and pelvis 12/03/2023 Transfuse 2 units PRBCs. Placement of 12 French drain via left mid abdominal approach with evacuation of 250 cc of purulent fluid per IR: Dr. Karalee 12/04/2023.  Antimicrobials:  Anti-infectives (From admission, onward)    Start     Dose/Rate Route Frequency Ordered Stop   12/03/23 2300  piperacillin -tazobactam (ZOSYN ) IVPB 3.375 g        3.375 g 12.5 mL/hr over 240 Minutes Intravenous Every 8 hours 12/03/23 2247           Subjective: Patient laying in bed.  Some improvement in left-sided abdominal pain.  Patient with complaints of a lump on the right abdominal region which she states has improved however significantly tender to palpation.  ID at bedside.    Objective: Vitals:   12/05/23 1456 12/05/23 1805 12/05/23 1917 12/06/23 0537  BP: (!) 141/69 (!) 129/103 (!) 146/68 (!) 145/64  Pulse: 71 75 74 72  Resp:  18 15 18   Temp:  98.6 F (37 C) 98.5 F (36.9 C) 99 F (37.2 C)  TempSrc:  Oral    SpO2:  99% 97% 99%  Weight:      Height:        Intake/Output Summary (Last 24 hours) at 12/06/2023 1202 Last data filed at 12/06/2023 0500 Gross per 24 hour  Intake 1399 ml  Output 50 ml  Net 1349 ml   Filed Weights   12/03/23 1640  Weight: 75.3 kg    Examination:  General exam: NAD. Respiratory system: CTAB.  No wheezes, no crackles, no rhonchi.  Fair air movement.  Speaking in full sentences.  No use of accessory muscles of respiration.   Cardiovascular system: Regular rate rhythm no murmurs rubs or gallops.  No JVD.  No lower extremity edema.  Gastrointestinal system: Abdomen is soft, nondistended, decreased tenderness to palpation in the left side however patient was significantly tenderness to palpation over the right.  JP drain in place with purulent drainage.   Ileostomy bag with stool noted.   Central nervous system: Alert and oriented. No focal neurological deficits. Extremities: Symmetric 5 x 5 power. Skin: No rashes, lesions or ulcers Psychiatry: Judgement and insight appear normal. Mood & affect appropriate.     Data Reviewed: I have personally reviewed following labs and imaging studies  CBC: Recent Labs  Lab 12/03/23 1646 12/04/23 0505 12/05/23 0032 12/06/23 0321  WBC 10.1 8.6 7.9 8.8  NEUTROABS  --   --  5.4 6.1  HGB 8.0* 7.4* 6.7* 9.4*  HCT 22.8* 21.8* 19.6* 26.9*  MCV 84.4 86.5 84.8 84.3  PLT 451* 424* 426* 352    Basic Metabolic Panel: Recent Labs  Lab 12/03/23 1646 12/04/23 0505 12/05/23 0032 12/06/23 0321  NA 136 141 142 143  K 3.6 3.5 3.3* 4.3  CL 100 105 107 108  CO2 23 25 25 25   GLUCOSE 127* 98 94 93  BUN 21 16 15 11   CREATININE 1.32* 1.07* 1.04* 0.91  CALCIUM  9.2 8.8* 8.6* 8.6*  MG  --   --  1.6* 2.1  PHOS  --   --  1.8* 2.8  GFR: Estimated Creatinine Clearance: 51.1 mL/min (by C-G formula based on SCr of 0.91 mg/dL).  Liver Function Tests: Recent Labs  Lab 12/03/23 1646  AST 29  ALT 23  ALKPHOS 133*  BILITOT 0.4  PROT 6.8  ALBUMIN 3.2*    CBG: No results for input(s): GLUCAP in the last 168 hours.   Recent Results (from the past 240 hours)  Aerobic/Anaerobic Culture w Gram Stain (surgical/deep wound)     Status: None (Preliminary result)   Collection Time: 12/04/23  1:50 PM   Specimen: Abscess  Result Value Ref Range Status   Specimen Description   Final    ABSCESS Performed at [facility], [address], [place], [place] [address]    Special Requests   Final    NONE Performed at [facility], [address], [place], [place] [address]    Gram Stain   Final    ABUNDANT WBC PRESENT, PREDOMINANTLY PMN ABUNDANT GRAM POSITIVE COCCI IN PAIRS IN CHAINS    Culture   Final    FEW KLEBSIELLA PNEUMONIAE SUSCEPTIBILITIES TO FOLLOW MODERATE  STREPTOCOCCUS ANGINOSIS SUSCEPTIBILITIES TO FOLLOW Performed at [facility], [address], [place], [place] [address]    Report Status PENDING  Incomplete         Radiology Studies: IR US  Guide Bx Asp/Drain Result Date: 12/04/2023 INDICATION: 78 year old female with postoperative parastomal fluid collection in the anterior abdominal wall. EXAM: US  IMAGE GUIDED DRAINAGE BY PERCUTANEOUS CATHETE MEDICATIONS: The patient is currently admitted to the hospital and receiving intravenous antibiotics. The antibiotics were administered within an appropriate time frame prior to the initiation of the procedure. ANESTHESIA/SEDATION: Fentanyl  2 mcg IV; Versed  100 mg IV administered by the radiology nurse Moderate Sedation Time:  12 minutes The patient's vital signs and level of consciousness were continuously monitored during the procedure by the interventional radiology nurse under my direct supervision. COMPLICATIONS: None immediate. PROCEDURE: Informed written consent was obtained from the patient after a thorough discussion of the procedural risks, benefits and alternatives. All questions were addressed. Maximal Sterile Barrier Technique was utilized including caps, mask, sterile gowns, sterile gloves, sterile drape, hand hygiene and skin antiseptic. A timeout was performed prior to the initiation of the procedure. Ultrasound was used to interrogate the anterior abdominal wall. A large complex fluid collection can be identified surrounding the stoma. Fluid also tracks into the peritoneal space around the liver. A suitable skin entry site was selected and marked. The skin was sterilely prepped and draped in the standard fashion using chlorhexidine  skin prep. Local anesthesia was attained by infiltration with 1% lidocaine . A small dermatotomy was made. Under real-time ultrasound guidance, an 18 gauge trocar needle was advanced to the fluid collection. A 0.035 wire was advanced into the collection and the  trocar needle was removed. The percutaneous tract was dilated to 69 French and a 12 French skater drainage catheter was advanced over the wire and formed. Aspiration was then performed yielding 250 mL thick tan colored purulent fluid. Samples were sent for Gram stain and culture. Next, ultrasound was again used to interrogate the abdominal wall. Not only is the superficial parastomal fluid collection decompressed, but the perihepatic fluid collection has also decompressed. These fluid collections are clearly communicating. Therefore, no second drainage catheter was placed. The drainage catheter was then flushed and connected to JP bulb suction before being secured to the skin with 0 Prolene suture. IMPRESSION: Successful placement of a 12 French drainage catheter into the parastomal  fluid collection with evacuation of 250 mL purulent fluid. Ultrasound confirms that both the parastomal and intraperitoneal perihepatic fluid collections decompressed through the single drainage catheter. Specimens were sent for Gram stain and culture. Electronically Signed   By: Wilkie Lent M.D.   On: 12/04/2023 14:32        Scheduled Meds:  sodium chloride    Intravenous Once   acetaminophen   500 mg Oral TID WC & HS   amLODipine   10 mg Oral QHS   aspirin  EC  81 mg Oral Daily   benazepril   40 mg Oral QHS   calcium  carbonate  2 tablet Oral BID WC   famotidine   20 mg Oral Daily   feeding supplement  237 mL Oral BID BM   labetalol   100 mg Oral BID   latanoprost   1 drop Both Eyes QHS   loperamide   2 mg Oral TID   multivitamins with iron   1 tablet Oral Daily   pantoprazole   40 mg Oral BID AC   polycarbophil  625 mg Oral BID   potassium chloride   40 mEq Oral Daily   sodium chloride  flush  10-40 mL Intracatheter Q12H   sodium chloride  flush  5 mL Intracatheter Q8H   timolol   1 drop Both Eyes Daily   Continuous Infusions:  piperacillin -tazobactam (ZOSYN )  IV 3.375 g (12/06/23 0550)     LOS: 3 days    Time  spent: 40 minutes    Toribio Hummer, MD [facility]   To contact the attending provider between 7A-7P or the covering provider during after hours 7P-7A, please log into the web site [email or web address] and access using universal [place] password for that web site. If you do not have the password, please call the hospital operator.  12/06/2023, 12:02 PM

## 2023-12-06 NOTE — Consult Note (Signed)
 Date of Admission:  12/03/2023          Reason for Consult: Intra-abdominal abscess with concern for mesh involvement    Referring Provider: Toribio Hummer, MD   Assessment:  Intraabdominal abscess in patient with history of ulcerative colitis, colon cancer status post colectomy with diverting ileostomy then with incarcerated abdominal wall hernia surgery repair There remains concern that the mesh may itself be infected CKD OSA  Plan:  Continue Zosyn  for now Follow-up abscess cultures She will need protracted antibiotics with close follow-up with us  general surgery and radiology  I am available for questions this weekend I will be following up on her culture data and will adjust her antibiotics accordingly.   Principal Problem:   Intra-abdominal infection Active Problems:   Hypertension   Status post ileostomy (HCC)   History of hernia repair   CKD stage 3a, GFR 45-59 ml/min (HCC)   Generalized weakness   Anemia   Scheduled Meds:  sodium chloride    Intravenous Once   acetaminophen   500 mg Oral TID WC & HS   amLODipine   10 mg Oral QHS   aspirin  EC  81 mg Oral Daily   benazepril   40 mg Oral QHS   calcium  carbonate  2 tablet Oral BID WC   famotidine   20 mg Oral Daily   feeding supplement  237 mL Oral BID BM   labetalol   100 mg Oral BID   latanoprost   1 drop Both Eyes QHS   lidocaine   15 mL Oral Once   loperamide   2 mg Oral TID   multivitamins with iron   1 tablet Oral Daily   pantoprazole   40 mg Oral BID AC   polycarbophil  625 mg Oral BID   potassium chloride   40 mEq Oral Daily   sodium chloride  flush  10-40 mL Intracatheter Q12H   sodium chloride  flush  5 mL Intracatheter Q8H   timolol   1 drop Both Eyes Daily   Continuous Infusions:  piperacillin -tazobactam (ZOSYN )  IV 3.375 g (12/06/23 1409)   PRN Meds:.bisacodyl , HYDROcodone -acetaminophen , HYDROmorphone  (DILAUDID ) injection, loperamide , methocarbamol  (ROBAXIN ) injection, methocarbamol ,  ondansetron  **OR** ondansetron  (ZOFRAN ) IV, mouth rinse, sodium chloride  flush  HPI: Rene Gonsoulin is a 78 y.o. female with past medical history significant for ulcerative colitis colon cancer status post curative partial colectomy with ileostomy chronic kidney disease gastroesophageal reflux disease and an incarcerated abdominal wall hernia status post surgery and repair on October 18, 2023 now presented ER with pain and drainage around her abdominal incision.  She was found on CT scan to have multiple lobulated abscesses around the ileostomy with increasing punctate gas along with surgical changes of total proctocolectomy and right lower quadrant end ileostomy with cholelithiasis.  She had been on antibiotics she had been on antibiotics IV in the form of Zosyn .  She was seen by interventional [facility] radiology who successfully placed a 12 French drainage catheter into the parastomal abscess and evacuated 250 mL of purulent material.  Cultures were sent and so far are showing gram-positive cocci in pairs and chains which would make me suspicious for Enterococcus.  I do not have suspicion for staphylococcal infection with his intra-abdominal infection.  My concern is that if the mesh is involved that we we will likely not be able to cure this with antibiotics alone.  We are certainly willing to treat this with antibiotics for a protracted period of time and follow her closely in the clinic while she also follows  with surgery and interventional radiology.  I am available this weekend for questions and we will follow-up on her culture data and adjust her antibiotics accordingly   I personally spent a total of 84 minutes in the care of the patient today including preparing to see the patient, getting/reviewing separately obtained history, performing a medically appropriate exam/evaluation, counseling and educating, placing orders, referring and communicating with other health care professionals,  documenting clinical information in the EHR, and independently interpreting results.    \Evaluation of the patient requires complex antimicrobial therapy evaluation, counseling , isolation needs to reduce disease transmission and risk assessment and mitigation.     Review of Systems: Review of Systems  Constitutional:  Negative for chills, diaphoresis, fever, malaise/fatigue and weight loss.  HENT:  Negative for congestion, hearing loss, sore throat and tinnitus.   Eyes:  Negative for blurred vision and double vision.  Respiratory:  Negative for cough, sputum production, shortness of breath and wheezing.   Cardiovascular:  Negative for chest pain, palpitations and leg swelling.  Gastrointestinal:  Positive for abdominal pain. Negative for blood in stool, constipation, diarrhea, heartburn, melena, nausea and vomiting.  Genitourinary:  Negative for dysuria, flank pain and hematuria.  Musculoskeletal:  Negative for back pain, falls, joint pain and myalgias.  Skin:  Negative for itching and rash.  Neurological:  Negative for dizziness, sensory change, focal weakness, loss of consciousness, weakness and headaches.  Endo/Heme/Allergies:  Does not bruise/bleed easily.  Psychiatric/Behavioral:  Negative for depression, memory loss and suicidal ideas. The patient is not nervous/anxious.     Past Medical History:  Diagnosis Date   Anemia    Arthritis    Bulging lumbar disc    Cancer (HCC)    colon   [name] (HCC) 01/13/2015   Degeneration of lumbar or lumbosacral intervertebral disc 01/21/2015   Essential hypertension 01/21/2015   GERD (gastroesophageal reflux disease) 01/21/2015   Heart murmur    ECHO 05-17-2023   History of excision of intestinal structure 10/18/2023   Hyperlipidemia 01/21/2015   Hypertension    Pre-diabetes    diet   S/P total colectomy 05/29/2019   Ulcerative colitis (HCC)     Social History   Tobacco Use   Smoking status: Never    Passive exposure:  Past   Smokeless tobacco: Never  Vaping Use   Vaping status: Never Used  Substance Use Topics   Alcohol use: No   Drug use: No    Family History  Problem Relation Age of Onset   Diabetes Mother    Ulcerative colitis Father    Heart failure Sister    Sleep apnea Son    Allergies  Allergen Reactions   Ozempic (0.25 Or 0.5 Mg-Dose) [Semaglutide(0.25 Or 0.5mg -Dos)] Nausea And Vomiting   Ezetimibe Other (See Comments)    Unknown   Gabapentin  Nausea And Vomiting    N/V and headaches   Iodinated Contrast Media Other (See Comments)    Feels faint, coughing, bp elevated   Mesalamine Other (See Comments)    Unknown   Oxycodone  Other (See Comments)    Hallucinations   Prilosec [Omeprazole ] Other (See Comments)    Made legs hurt, hair fell out, and nauseated.    Statins Other (See Comments)    Muscle spasms     OBJECTIVE: Blood pressure (!) 142/61, pulse 73, temperature 98.3 F (36.8 C), resp. rate 17, height 5' 3 (1.6 m), weight 75.3 kg, SpO2 95%.  Physical Exam Constitutional:      General: She  is not in acute distress.    Appearance: Normal appearance. She is well-developed. She is not ill-appearing or diaphoretic.  HENT:     Head: Normocephalic and atraumatic.     Right Ear: Hearing and external ear normal.     Left Ear: Hearing and external ear normal.     Nose: No nasal deformity or rhinorrhea.  Eyes:     General: No scleral icterus.    Conjunctiva/sclera: Conjunctivae normal.     Right eye: Right conjunctiva is not injected.     Left eye: Left conjunctiva is not injected.     Pupils: Pupils are equal, round, and reactive to light.  Neck:     Vascular: No JVD.  Cardiovascular:     Rate and Rhythm: Normal rate and regular rhythm.     Heart sounds: S1 normal and S2 normal.  Pulmonary:     Effort: No respiratory distress.     Breath sounds: No wheezing.  Abdominal:     General: Bowel sounds are normal. There is no distension.     Palpations: Abdomen is soft.      Tenderness: There is abdominal tenderness in the right upper quadrant. There is guarding.     Comments: Ostomy in place  JP drain with purulent material in place  Musculoskeletal:        General: Normal range of motion.     Right shoulder: Normal.     Left shoulder: Normal.     Cervical back: Normal range of motion and neck supple.     Right hip: Normal.     Left hip: Normal.     Right knee: Normal.     Left knee: Normal.  Lymphadenopathy:     Head:     Right side of head: No submandibular, preauricular or posterior auricular adenopathy.     Left side of head: No submandibular, preauricular or posterior auricular adenopathy.     Cervical: No cervical adenopathy.     Right cervical: No superficial or deep cervical adenopathy.    Left cervical: No superficial or deep cervical adenopathy.  Skin:    General: Skin is warm and dry.     Coloration: Skin is not pale.     Findings: No abrasion, bruising, ecchymosis, erythema, lesion or rash.     Nails: There is no clubbing.  Neurological:     Mental Status: She is alert and oriented to person, place, and time.     Sensory: No sensory deficit.     Coordination: Coordination normal.     Gait: Gait normal.  Psychiatric:        Attention and Perception: She is attentive.        Speech: Speech normal.        Behavior: Behavior normal. Behavior is cooperative.        Thought Content: Thought content normal.        Judgment: Judgment normal.     Lab Results Lab Results  Component Value Date   WBC 8.8 12/06/2023   HGB 9.4 (L) 12/06/2023   HCT 26.9 (L) 12/06/2023   MCV 84.3 12/06/2023   PLT 352 12/06/2023    Lab Results  Component Value Date   CREATININE 0.91 12/06/2023   BUN 11 12/06/2023   NA 143 12/06/2023   K 4.3 12/06/2023   CL 108 12/06/2023   CO2 25 12/06/2023    Lab Results  Component Value Date   ALT 23 12/03/2023   AST 29 12/03/2023  ALKPHOS 133 (H) 12/03/2023   BILITOT 0.4 12/03/2023      Microbiology: Recent Results (from the past 240 hours)  Aerobic/Anaerobic Culture w Gram Stain (surgical/deep wound)     Status: None (Preliminary result)   Collection Time: 12/04/23  1:50 PM   Specimen: Abscess  Result Value Ref Range Status   Specimen Description   Final    ABSCESS Performed at [facility], [address], [place], [place] [address]    Special Requests   Final    NONE Performed at [facility], [address] [place], [place] [address]    Gram Stain   Final    ABUNDANT WBC PRESENT, PREDOMINANTLY PMN ABUNDANT GRAM POSITIVE COCCI IN PAIRS IN CHAINS    Culture   Final    CULTURE REINCUBATED FOR BETTER GROWTH Performed at [facility], [address], [place], [place] [address]    Report Status PENDING  Incomplete    Jomarie Fleeta Rothman, MD [facility] for [facility] [phone] pager  12/06/2023, 2:39 PM

## 2023-12-06 NOTE — Progress Notes (Addendum)
 12/06/2023  Donna [name] [id number] [birth year]  CARE TEAM: PCP: Clarice Nottingham, MD  Outpatient Care Team: Patient Care Team: Clarice Nottingham, MD as PCP - General (Internal Medicine) Elmira Newman PARAS, MD as PCP - Cardiology (Cardiology) Teresa Lonni HERO, MD as Consulting Physician (Colon and Rectal Surgery) Sheldon Standing, MD as Consulting Physician (General Surgery) Donnald Charleston, MD as Consulting Physician (Gastroenterology) Elmira Newman PARAS, MD as Consulting Physician (Cardiology)  Inpatient Treatment Team: Treatment Team:  Sebastian Toribio GAILS, MD Bobbette File, MD Sheldon Standing, MD Ruthellen Ruthellen Radiology, MD Arnell, Purity K, RN Cleotilde, Richerd HERO, PT Grady Chum, LPN Claudene Shela MATSU, [place]   Problem List:   Principal Problem:   Intra-abdominal infection Active Problems:   Hypertension   Status post ileostomy [name])   History of hernia repair   CKD stage 3a, GFR 45-59 ml/min (HCC)   Generalized weakness   Anemia   10/18/2023  POST-OPERATIVE DIAGNOSIS:  PARASTOMAL HERNIA AND VENTRAL INCISIONAL RECURRENT AND INCARCERATED ABDOMINAL WALL HERNIAE  Dimensions of hernia post-op:  16cm x 8cm   PROCEDURE:   ROBOTIC COMPONENT SEPARATION (transversus abdominis release = TAR) on right Repair of incarcerated recurrent incisional and parastomal hernias with mesh ROBOTIC LYSIS OF ADHESIONS SEROSAL REPAIR x2 END ILEOSTOMY REVISION TAP BLOCK - BILATERAL   SURGEON:  Standing KYM Sheldon, MD  OR FINDINGS:  Moderately dense adhesions small bowel to anterior abdominal wall and periumbilical midline hernia.  Swiss cheese hernias extending up to the subxiphoid region.  15 cm in length.  Moderate parastomal hernia around end ileostomy with a foot of small bowel densely adherent to the hernia sac.  Serosal repairs down x 2.  Ileostomy revision done for last 7 cm given poor tissues.   Type of ventral wall repair:  Laparoscopic underlay repair .with Primary  repair of largest hernia Placement of mesh: Right lateral and flank retrorectus preperitoneal.  Midline intraperitoneal. Name of mesh: Bard Ventralight dual sided (polypropylene / Seprafilm) Size of mesh: 33x27cm Orientation: Transverse Mesh overlap:  7cm    Assessment Donna [name] Stay = 3 days)      Delayed abdominal fluid collection status post drainage.  Concerning for abscess  Plan:  New collection formation rather odd 6 weeks later in old hernia sac & right abd wall.    IR drainage 12/3 concern for purulence -everything collapsed with a one drain fortunately Groin strep and Klebsiella.  Sensitivities pending  continue piperacillin /tazobactam.  Rule out MRSA.  Suspect drain needs to stay in for a while with follow-up drain study in 3 weeks to make sure everything collapses and not come back.  Given the broad fluid collection it would be wise to do a drain study in about 2-3 weeks per protocol to make sure collections have resolved.  IR is aware but I do not think they have arranged yet.  I think she is close to discharge so hopefully they will do that.  Suspect patient is going to need antibiotics for prolonged period of time since she has fluid collections near her mesh.  Hopefully simple is Augmentin 875 p.o. twice daily.  Would like to hold off on removing all the mesh unless we are months into this and it will not consistently resolve.  My instinct should probably need 6 weeks of antibiotics.  Check sensitivities and see if we can transition to something orally.  Might need to have infectious ease involved but I would hold off for now.  Right upper quadrant 3 x 3 cm subcutaneous nodule  most likely part of the old hernia sac closing down or hematoma.  Does not seem like an abscess.  Ice and pain control and follow-up.  If getting larger, repeat CAT scan to make sure there is not another undrained collection.  Low  hemoglobin in the setting of iron  deficiency anemia.    Give IV  iron  since doubt good PO intake No extra anticoagulation for now Dropped hemoglobin getting transfused.  Suspect this was more of an infected hematoma.  There is no evidence of any active GI bleeding.  No hematochezia or hematemesis.    Solid diet as tolerated.    Ileostomy pink with no leaking or concerns there.  Wound ostomy nursing team evaluated per my request and agrees.  Doubt perforation or separation there.    Continue usual bowel regimen with with fiber and loperamide .  Do twice daily regimen with low threshold to increase to 3 times daily    -monitor electrolytes & replace as needed.  Corrected hypokalemia.  Keep K>4, Mg>2, Phos>3  Hypertension, GERD, sleep apnea, etc. per primary service often uses oxygen at night for sleep apnea..  -VTE prophylaxis- SCDs.  Anticoagulation prophyllaxis SQ as appropriate  -mobilize as tolerated to help recovery.  Enlist therapies in moderate/high risk patients as appropriate  I updated the patient's status to the patient  Recommendations were made.  Questions were answered.  She expressed understanding & appreciation.  -Disposition: Stable to discharge from surgery standpoint.  Do think it is wise to follow-up on culture sensitivity so we can have a good outpatient antibiotic plan.    I reviewed nursing notes, last 24 h vitals and pain scores, last 48 h intake and output, last 24 h labs and trends, and last 24 h imaging results.  I have reviewed this patient's available data, including medical history, events of note, test results, etc as part of my evaluation.   A significant portion of that time was spent in counseling. Care during the described time interval was provided by me.  This care required moderate level of medical decision making.  12/06/2023    Subjective: (Chief complaint)  Feeling better.  Eating better.  Notes persistent soreness in one not in the right upper quadrant.  Not worse.  Objective:  Vital  signs:  Vitals:   12/05/23 1456 12/05/23 1805 12/05/23 1917 12/06/23 0537  BP: (!) 141/69 (!) 129/103 (!) 146/68 (!) 145/64  Pulse: 71 75 74 72  Resp:  18 15 18   Temp:  98.6 F (37 C) 98.5 F (36.9 C) 99 F (37.2 C)  TempSrc:  Oral    SpO2:  99% 97% 99%  Weight:      Height:        Last BM Date : 12/05/23  Intake/Output   Yesterday:  12/04 0701 - 12/05 0700 In: 1759 [P.O.:940; Blood:784] Out: 180 [Drains:30; Stool:150] This shift:  No intake/output data recorded.  Bowel function:  Flatus: YES  BM:  YES  Drain: Left periumbilical drain in place with thin serous with slightly purulent effluent   Physical Exam:  General: Pt awake/alert in no acute distress.  Walking around the room with no assistance.  Smiling.  Not toxic.  Not sickly.  This is the best time seen her .  Calm.   Eyes: PERRL, normal EOM.  Sclera clear.  No icterus Neuro: CN II-XII intact w/o focal sensory/motor deficits. Lymph: No head/neck/groin lymphadenopathy Psych:  No delerium/psychosis/paranoia.  Oriented x 4 HENT: Normocephalic, Mucus membranes moist.  No  thrush Neck: Supple, No tracheal deviation.  No obvious thyromegaly Chest: No pain to chest wall compression.  Good respiratory excursion.  No audible wheezing CV:  Pulses intact.  Regular rhythm.  No major extremity edema MS: Normal AROM mjr joints.  No obvious deformity  Abdomen: Obese.  Sensitive only in right upper quadrant subcostal 3 x 3 cm E drain consistent with a hematoma or not.  Does not appear to be fluctuant like an abscess.  Rest of the abdomen soft.   Swelling gone down.  No peau d'orange or cellulitis.    Ileostomy pink.  No evidence of any separation around the edges or necrosis or dehiscence.  No infection abscess or purulence there.  Flatus and oatmeal thick effluent from ileostomy.  The rest the abdomen is  Soft.  Nondistended.    Ext:   No deformity.  No mjr edema.  No cyanosis Skin: No petechiae / purpurea.  No major sores.   Warm and dry    Results:   Cultures: Recent Results (from the past 720 hours)  Aerobic/Anaerobic Culture w Gram Stain (surgical/deep wound)     Status: None   Collection Time: 11/09/23 11:31 AM   Specimen: Wound; Abdominal Fluid  Result Value Ref Range Status   Specimen Description   Final    WOUND ASCITIES Performed at [facility], [address], [place], [place] [address]    Special Requests   Final    NONE Performed at [name], [address] [place], [place] [address]    Gram Stain NO WBC SEEN NO ORGANISMS SEEN   Final   Culture   Final    No growth aerobically or anaerobically. Performed at [facility], [address], [place], [place] [address]    Report Status 11/15/2023 FINAL  Final  Aerobic/Anaerobic Culture w Gram Stain (surgical/deep wound)     Status: None (Preliminary result)   Collection Time: 12/04/23  1:50 PM   Specimen: Abscess  Result Value Ref Range Status   Specimen Description   Final    ABSCESS Performed at [facility], [address] [place], [place] [address]    Special Requests   Final    NONE Performed at [facility], [address] [place], [place] [address]    Gram Stain   Final    ABUNDANT WBC PRESENT, PREDOMINANTLY PMN ABUNDANT GRAM POSITIVE COCCI IN PAIRS IN CHAINS    Culture   Final    FEW KLEBSIELLA PNEUMONIAE SUBCULTURING FOR SUSCEPTIBILITIES MODERATE STREPTOCOCCUS ANGINOSIS SUSCEPTIBILITIES TO FOLLOW Performed at [facility], [address], [place], [place] [address]    Report Status PENDING  Incomplete    Labs: Results for orders placed or performed during the hospital encounter of 12/03/23 (from the past 48 hours)  Protime-INR     Status: None   Collection Time: 12/04/23 10:59 AM  Result Value Ref Range   Prothrombin Time 14.2 11.4 - 15.2 seconds   INR 1.0 0.8 - 1.2    Comment: (NOTE) INR goal varies based on device  and disease states. Performed at [facility], [address], [place], [place] [address]   Aerobic/Anaerobic Culture w Gram Stain (surgical/deep wound)     Status: None (Preliminary result)   Collection Time: 12/04/23  1:50 PM   Specimen: Abscess  Result Value Ref Range   Specimen Description      ABSCESS Performed at [facility],  [address], [place], [place] [address]    Special Requests      NONE Performed at [facility], [address] [place], [place] [address]    Gram Stain      ABUNDANT WBC PRESENT, PREDOMINANTLY PMN ABUNDANT GRAM POSITIVE COCCI IN PAIRS IN CHAINS    Culture      FEW KLEBSIELLA PNEUMONIAE SUBCULTURING FOR SUSCEPTIBILITIES MODERATE STREPTOCOCCUS ANGINOSIS SUSCEPTIBILITIES TO FOLLOW Performed at [facility], [address] [place], [place] [address]    Report Status PENDING   CBC with Differential/Platelet     Status: Abnormal   Collection Time: 12/05/23 12:32 AM  Result Value Ref Range   WBC 7.9 4.0 - 10.5 K/uL   RBC 2.31 (L) 3.87 - 5.11 MIL/uL   Hemoglobin 6.7 (LL) 12.0 - 15.0 g/dL    Comment: REPEATED TO VERIFY This critical result has been called to ELOUISE ORN RN by [facility] on 12/05/2023 05:22:16, and has been read back.    HCT 19.6 (L) 36.0 - 46.0 %   MCV 84.8 80.0 - 100.0 fL   MCH 29.0 26.0 - 34.0 pg   MCHC 34.2 30.0 - 36.0 g/dL   RDW 82.9 (H) 88.4 - 84.4 %   Platelets 426 (H) 150 - 400 K/uL   nRBC 0.0 0.0 - 0.2 %   Neutrophils Relative % 68 %   Neutro Abs 5.4 1.7 - 7.7 K/uL   Lymphocytes Relative 14 %   Lymphs Abs 1.1 0.7 - 4.0 K/uL   Monocytes Relative 12 %   Monocytes Absolute 1.0 0.1 - 1.0 K/uL   Eosinophils Relative 4 %   Eosinophils Absolute 0.3 0.0 - 0.5 K/uL   Basophils Relative 1 %   Basophils Absolute 0.1 0.0 - 0.1 K/uL   WBC Morphology MORPHOLOGY UNREMARKABLE    Smear Review Normal platelet morphology    Immature Granulocytes 1 %   Abs  Immature Granulocytes 0.08 (H) 0.00 - 0.07 K/uL   Target Cells PRESENT     Comment: Performed at [facility], [address], [place], [place] [address]  Basic metabolic panel     Status: Abnormal   Collection Time: 12/05/23 12:32 AM  Result Value Ref Range   Sodium 142 135 - 145 mmol/L   Potassium 3.3 (L) 3.5 - 5.1 mmol/L   Chloride 107 98 - 111 mmol/L   CO2 25 22 - 32 mmol/L   Glucose, Bld 94 70 - 99 mg/dL    Comment: Glucose reference range applies only to samples taken after fasting for at least 8 hours.   BUN 15 8 - 23 mg/dL   Creatinine, Ser 8.95 (H) 0.44 - 1.00 mg/dL   Calcium  8.6 (L) 8.9 - 10.3 mg/dL   GFR, Estimated 55 (L) >60 mL/min    Comment: (NOTE) Calculated using the CKD-EPI Creatinine Equation (2021)    Anion gap 11 5 - 15    Comment: Performed at [facility], [address] [place], [place] [address]  Magnesium      Status: Abnormal   Collection Time: 12/05/23 12:32 AM  Result Value Ref Range   Magnesium  1.6 (L) 1.7 - 2.4 mg/dL    Comment: Performed at [facility], [address] [place], [place] [address]  Phosphorus     Status: Abnormal   Collection Time: 12/05/23 12:32 AM  Result Value Ref Range   Phosphorus 1.8 (L) 2.5 - 4.6 mg/dL    Comment: Performed at  [facility], [address], [place], [place] [address]  Type and screen [facility]     Status: None (Preliminary result)   Collection Time: 12/05/23  6:05 AM  Result Value Ref Range   ABO/RH(D) A POS    Antibody Screen NEG    Sample Expiration 12/08/2023,2359    Unit Number [id number]    Blood Component Type RED CELLS,LR    Unit division 00    Status of Unit ISSUED    Transfusion Status OK TO TRANSFUSE    Crossmatch Result Compatible    Unit Number [id number]    Blood Component Type RED CELLS,LR    Unit division 00    Status of Unit ISSUED    Transfusion Status OK TO TRANSFUSE    Crossmatch  Result      Compatible Performed at [name], [address], [place], [place] [address]   Prepare RBC (crossmatch)     Status: None   Collection Time: 12/05/23  6:05 AM  Result Value Ref Range   Order Confirmation      ORDER PROCESSED BY BLOOD BANK Performed at [facility], [address] [place], [place] [address]   Prepare RBC (crossmatch)     Status: None   Collection Time: 12/05/23  8:09 AM  Result Value Ref Range   Order Confirmation      ORDER PROCESSED BY BLOOD BANK Performed at [facility], [address] [place], [place] [address]   Occult blood card to lab, stool     Status: Abnormal   Collection Time: 12/05/23 12:45 PM  Result Value Ref Range   Fecal Occult Bld POSITIVE (A) NEGATIVE    Comment: Performed at [facility], [address] [place], [place] [address]  Folate     Status: None   Collection Time: 12/05/23  2:30 PM  Result Value Ref Range   Folate >20.0 >5.9 ng/mL    Comment: Performed at [facility], [address] [place], [place] [address]  Vitamin B12     Status: Abnormal   Collection Time: 12/05/23  2:31 PM  Result Value Ref Range   Vitamin B-12 2,421 (H) 180 - 914 pg/mL    Comment: Performed at [facility], [address] [place], [place] [address]  Iron  and TIBC     Status: Abnormal   Collection Time: 12/05/23  2:31 PM  Result Value Ref Range   Iron  54 28 - 170 ug/dL   TIBC 813 (L) 749 - 549 ug/dL   Saturation Ratios 29 10.4 - 31.8 %   UIBC 132 ug/dL    Comment: Performed at [facility], [address], [place], [place] [address]  Ferritin     Status: Abnormal   Collection Time: 12/05/23  2:31 PM  Result Value Ref Range   Ferritin 961 (H) 11 - 307 ng/mL    Comment: Performed at [facility], [address] [place], [place] [address]  Magnesium      Status: None   Collection Time:  12/06/23  3:21 AM  Result Value Ref Range   Magnesium  2.1 1.7 - 2.4 mg/dL    Comment: Performed at [facility], [address] [place], [place] [address]  CBC with Differential/Platelet     Status: Abnormal   Collection Time: 12/06/23  3:21 AM  Result Value Ref Range   WBC 8.8 4.0 - 10.5 K/uL  RBC 3.19 (L) 3.87 - 5.11 MIL/uL   Hemoglobin 9.4 (L) 12.0 - 15.0 g/dL    Comment: REPEATED TO VERIFY POST TRANSFUSION SPECIMEN    HCT 26.9 (L) 36.0 - 46.0 %   MCV 84.3 80.0 - 100.0 fL   MCH 29.5 26.0 - 34.0 pg   MCHC 34.9 30.0 - 36.0 g/dL   RDW 83.7 (H) 88.4 - 84.4 %   Platelets 352 150 - 400 K/uL   nRBC 0.0 0.0 - 0.2 %   Neutrophils Relative % 68 %   Neutro Abs 6.1 1.7 - 7.7 K/uL   Lymphocytes Relative 14 %   Lymphs Abs 1.2 0.7 - 4.0 K/uL   Monocytes Relative 10 %   Monocytes Absolute 0.9 0.1 - 1.0 K/uL   Eosinophils Relative 5 %   Eosinophils Absolute 0.4 0.0 - 0.5 K/uL   Basophils Relative 1 %   Basophils Absolute 0.1 0.0 - 0.1 K/uL   Immature Granulocytes 2 %   Abs Immature Granulocytes 0.15 (H) 0.00 - 0.07 K/uL    Comment: Performed at [facility], [address], [place], [place] [address]  Basic metabolic panel     Status: Abnormal   Collection Time: 12/06/23  3:21 AM  Result Value Ref Range   Sodium 143 135 - 145 mmol/L   Potassium 4.3 3.5 - 5.1 mmol/L    Comment: HEMOLYSIS AT THIS LEVEL MAY AFFECT RESULT Delta check noted     Chloride 108 98 - 111 mmol/L   CO2 25 22 - 32 mmol/L   Glucose, Bld 93 70 - 99 mg/dL    Comment: Glucose reference range applies only to samples taken after fasting for at least 8 hours.   BUN 11 8 - 23 mg/dL   Creatinine, Ser 9.08 0.44 - 1.00 mg/dL   Calcium  8.6 (L) 8.9 - 10.3 mg/dL   GFR, Estimated >39 >39 mL/min    Comment: (NOTE) Calculated using the CKD-EPI Creatinine Equation (2021)    Anion gap 10 5 - 15    Comment: Performed at [facility], [address], [place],  [place] [address]     Imaging / Studies: IR US  Guide Bx Asp/Drain Result Date: 12/04/2023 INDICATION: 78 year old female with postoperative parastomal fluid collection in the anterior abdominal wall. EXAM: US  IMAGE GUIDED DRAINAGE BY PERCUTANEOUS CATHETE MEDICATIONS: The patient is currently admitted to the hospital and receiving intravenous antibiotics. The antibiotics were administered within an appropriate time frame prior to the initiation of the procedure. ANESTHESIA/SEDATION: Fentanyl  2 mcg IV; Versed  100 mg IV administered by the radiology nurse Moderate Sedation Time:  12 minutes The patient's vital signs and level of consciousness were continuously monitored during the procedure by the interventional radiology nurse under my direct supervision. COMPLICATIONS: None immediate. PROCEDURE: Informed written consent was obtained from the patient after a thorough discussion of the procedural risks, benefits and alternatives. All questions were addressed. Maximal Sterile Barrier Technique was utilized including caps, mask, sterile gowns, sterile gloves, sterile drape, hand hygiene and skin antiseptic. A timeout was performed prior to the initiation of the procedure. Ultrasound was used to interrogate the anterior abdominal wall. A large complex fluid collection can be identified surrounding the stoma. Fluid also tracks into the peritoneal space around the liver. A suitable skin entry site was selected and marked. The skin was sterilely prepped and draped in the standard fashion using chlorhexidine  skin prep. Local anesthesia was attained by infiltration with 1% lidocaine . A small dermatotomy was made.  Under real-time ultrasound guidance, an 18 gauge trocar needle was advanced to the fluid collection. A 0.035 wire was advanced into the collection and the trocar needle was removed. The percutaneous tract was dilated to 17 French and a 12 French skater drainage catheter was advanced over the wire and formed. Aspiration  was then performed yielding 250 mL thick tan colored purulent fluid. Samples were sent for Gram stain and culture. Next, ultrasound was again used to interrogate the abdominal wall. Not only is the superficial parastomal fluid collection decompressed, but the perihepatic fluid collection has also decompressed. These fluid collections are clearly communicating. Therefore, no second drainage catheter was placed. The drainage catheter was then flushed and connected to JP bulb suction before being secured to the skin with 0 Prolene suture. IMPRESSION: Successful placement of a 12 French drainage catheter into the parastomal fluid collection with evacuation of 250 mL purulent fluid. Ultrasound confirms that both the parastomal and intraperitoneal perihepatic fluid collections decompressed through the single drainage catheter. Specimens were sent for Gram stain and culture. Electronically Signed   By: Wilkie Lent M.D.   On: 12/04/2023 14:32     Medications / Allergies: per chart  Antibiotics: Anti-infectives (From admission, onward)    Start     Dose/Rate Route Frequency Ordered Stop   12/03/23 2300  piperacillin -tazobactam (ZOSYN ) IVPB 3.375 g        3.375 g 12.5 mL/hr over 240 Minutes Intravenous Every 8 hours 12/03/23 2247           Note: Portions of this report may have been transcribed using voice recognition software. Every effort was made to ensure accuracy; however, inadvertent computerized transcription errors may be present.   Any transcriptional errors that result from this process are unintentional.    Donna KYM Schultze, MD, FACS, MASCRS Esophageal, Gastrointestinal & Colorectal Surgery Robotic and Minimally Invasive Surgery  [facility] [address] [place], [place] [address] [phone] Fax [phone] Main  CONTACT INFORMATION: Weekday (9AM-5PM): Call [facility] main office at [phone] Weeknight  (5PM-9AM) or Weekend/Holiday: Check EPIC Web Links tab & use AMION (password  [email or web address]) for General Surgery [facility] coverage  Please, DO NOT use SecureChat  (it is not reliable communication to reach operating surgeons & will lead to a delay in care).   Epic staff messaging available for outpatient concerns needing 1-2 business day response.      12/06/2023  7:13 AM

## 2023-12-07 DIAGNOSIS — B999 Unspecified infectious disease: Secondary | ICD-10-CM | POA: Diagnosis not present

## 2023-12-07 DIAGNOSIS — Z932 Ileostomy status: Secondary | ICD-10-CM | POA: Diagnosis not present

## 2023-12-07 DIAGNOSIS — N1831 Chronic kidney disease, stage 3a: Secondary | ICD-10-CM | POA: Diagnosis not present

## 2023-12-07 DIAGNOSIS — Z9889 Other specified postprocedural states: Secondary | ICD-10-CM | POA: Diagnosis not present

## 2023-12-07 LAB — CBC
HCT: 29.5 % — ABNORMAL LOW (ref 36.0–46.0)
Hemoglobin: 10.2 g/dL — ABNORMAL LOW (ref 12.0–15.0)
MCH: 29.9 pg (ref 26.0–34.0)
MCHC: 34.6 g/dL (ref 30.0–36.0)
MCV: 86.5 fL (ref 80.0–100.0)
Platelets: 377 K/uL (ref 150–400)
RBC: 3.41 MIL/uL — ABNORMAL LOW (ref 3.87–5.11)
RDW: 16.9 % — ABNORMAL HIGH (ref 11.5–15.5)
WBC: 8.7 K/uL (ref 4.0–10.5)
nRBC: 0 % (ref 0.0–0.2)

## 2023-12-07 LAB — RENAL FUNCTION PANEL
Albumin: 3.2 g/dL — ABNORMAL LOW (ref 3.5–5.0)
Anion gap: 9 (ref 5–15)
BUN: 10 mg/dL (ref 8–23)
CO2: 25 mmol/L (ref 22–32)
Calcium: 9.2 mg/dL (ref 8.9–10.3)
Chloride: 107 mmol/L (ref 98–111)
Creatinine, Ser: 0.93 mg/dL (ref 0.44–1.00)
GFR, Estimated: >60 mL/min (ref >60)
Glucose, Bld: 112 mg/dL — ABNORMAL HIGH (ref 70–99)
Phosphorus: 1.7 mg/dL — ABNORMAL LOW (ref 2.5–4.6)
Potassium: 4.8 mmol/L (ref 3.5–5.1)
Sodium: 140 mmol/L (ref 135–145)

## 2023-12-07 LAB — MAGNESIUM: Magnesium: 1.9 mg/dL (ref 1.7–2.4)

## 2023-12-07 MED ORDER — POTASSIUM PHOSPHATES 15 MMOLE/5ML IV SOLN
30.0000 mmol | Freq: Once | INTRAVENOUS | Status: AC
  Administered 2023-12-07: 30 mmol via INTRAVENOUS
  Filled 2023-12-07: qty 10

## 2023-12-07 NOTE — Progress Notes (Signed)
       Subjective: Reports she is feeling a little better this morning. Has mild abdominal pain. Afebrile.   Objective: Vital signs in last 24 hours: Temp:  [98.2 F (36.8 C)-98.5 F (36.9 C)] 98.2 F (36.8 C) (12/06 0425) Pulse Rate:  [73-75] 74 (12/06 0425) Resp:  [17-18] 17 (12/06 0425) BP: (136-142)/(61-78) 137/78 (12/06 0425) SpO2:  [95 %-100 %] 99 % (12/06 0425) Last BM Date : 12/06/23  Intake/Output from previous day: 12/05 0701 - 12/06 0700 In: 702.1 [P.O.:360; IV Piggyback:342.1] Out: 530 [Drains:30; Stool:500] Intake/Output this shift: No intake/output data recorded.  PE: General: sitting up in bed, NAD Neuro: alert and oriented, no focal deficits Resp: normal work of breathing on room air Abdomen: soft, nondistended. Ileostomy productive of stool. LUQ drain with seropurulent fluid. Extremities: warm and well-perfused   Lab Results:  Recent Labs    12/05/23 0032 12/06/23 0321  WBC 7.9 8.8  HGB 6.7* 9.4*  HCT 19.6* 26.9*  PLT 426* 352   BMET Recent Labs    12/05/23 0032 12/06/23 0321  NA 142 143  K 3.3* 4.3  CL 107 108  CO2 25 25  GLUCOSE 94 93  BUN 15 11  CREATININE 1.04* 0.91  CALCIUM  8.6* 8.6*   PT/INR Recent Labs    12/04/23 1059  LABPROT 14.2  INR 1.0   CMP     Component Value Date/Time   NA 143 12/06/2023 0321   NA 144 04/30/2022 0820   K 4.3 12/06/2023 0321   CL 108 12/06/2023 0321   CO2 25 12/06/2023 0321   GLUCOSE 93 12/06/2023 0321   BUN 11 12/06/2023 0321   BUN 24 04/30/2022 0820   CREATININE 0.91 12/06/2023 0321   CALCIUM  8.6 (L) 12/06/2023 0321   PROT 6.8 12/03/2023 1646   ALBUMIN 3.2 (L) 12/03/2023 1646   AST 29 12/03/2023 1646   ALT 23 12/03/2023 1646   ALKPHOS 133 (H) 12/03/2023 1646   BILITOT 0.4 12/03/2023 1646   GFRNONAA >60 12/06/2023 0321   GFRAA >60 06/04/2019 0413   Lipase     Component Value Date/Time   LIPASE 20 12/03/2023 1646       Studies/Results: No results  found.  Anti-infectives: Anti-infectives (From admission, onward)    Start     Dose/Rate Route Frequency Ordered Stop   12/03/23 2300  piperacillin -tazobactam (ZOSYN ) IVPB 3.375 g        3.375 g 12.5 mL/hr over 240 Minutes Intravenous Every 8 hours 12/03/23 2247          Assessment/Plan 78 yo female with a history of total abdominal colectomy and end ileostomy, now s/p parastomal hernia repair with mesh. She has an infected fluid collection with a percutaneous drain in place.  - Drain cultures are pending, growing gram positive cocci - Continue broad-spectrum antibiotics for now, will need a course for several weeks as there is mesh in place. - Appreciate ID recommendations - Will continue to follow    LOS: 4 days    Leonor Dawn, MD [facility] Surgery General, Hepatobiliary and Pancreatic Surgery 12/07/23 8:08 AM

## 2023-12-07 NOTE — Plan of Care (Signed)
  Problem: Education: Goal: Knowledge of General Education information will improve Description: Including pain rating scale, medication(s)/side effects and non-pharmacologic comfort measures Outcome: Progressing   Problem: Clinical Measurements: Goal: Ability to maintain clinical measurements within normal limits will improve Outcome: Progressing Goal: Will remain free from infection Outcome: Progressing   Problem: Activity: Goal: Risk for activity intolerance will decrease Outcome: Progressing   Problem: Elimination: Goal: Will not experience complications related to bowel motility Outcome: Progressing Goal: Will not experience complications related to urinary retention Outcome: Progressing   Problem: Pain Managment: Goal: General experience of comfort will improve and/or be controlled Outcome: Progressing   Problem: Safety: Goal: Ability to remain free from injury will improve Outcome: Progressing

## 2023-12-07 NOTE — Progress Notes (Signed)
 PROGRESS NOTE    Donna [name]  FMW:[id number] DOB: 1945/08/03 DOA: 12/03/2023 PCP: Clarice Nottingham, MD    Chief Complaint  Patient presents with   Post-op Problem    Brief Narrative:  Donna [name] is a 78 y.o. female with medical history significant for ulcerative colitis, colon cancer s/p colectomy with ileostomy, CKD-3A, GERD, DDD, OSA, incarcerated abdominal wall hernia s/p surgery/repair on 10/18/23 who presented to the ED for evaluation of pain and drainage around her abdominal incision site and admitted for intra-abdominal infection.  Patient placed empirically on IV antibiotics.  General surgery consulted.  IR also consulted for drain placement per general surgery.     Assessment & Plan:   Principal Problem:   Intra-abdominal infection Active Problems:   Hypertension   Status post ileostomy (HCC)   History of hernia repair   CKD stage 3a, GFR 45-59 ml/min (HCC)   Generalized weakness   Anemia  #1 intra-abdominal infection/history of incarcerated abdominal wall hernia s/p repair -Patient noted with multiple hospitalizations over the past month for complications related to recent hernia repair. - Patient presented with diffuse abdominal pain x 3 to 4 days prior to admission with increased drainage and pain around surgical site. - CT abdomen and pelvis with multiple loculated fluid collections surrounding the ileostomy with increasing punctuate gas, suggestive of superinfection and/or enteric communication and relative hyperdensity suggestive of hemorrhagic fluid collections that have enlarged since prior examination. - Patient seen in consultation by general surgery who has asked IR to place drains in collections for now and cultures to be obtained. - It is noted per general surgery the last fluid collection aspiration of the abdominal fluid collection 11/8 was consistent with seroma and cultures negative. -Patient seen by IR and patient status post 12 French  drain placement and left mid abdominal region with evacuation of 250 cc of purulent fluid, 12/04/2023. -Preliminary cultures with abundant GPC in pairs and chains, multiple organisms present, none predominant.. - Continue empiric IV Zosyn . -Patient seen in consultation by ID who are recommending continuation of IV Zosyn  for now, follow-up abscess cultures and feel patient would likely need protracted antibiotics with close follow-up with ID, general surgery and radiology. - Per general surgery.    #2  CKD 3A -Creatinine noted at 1.32 which is not significantly different from recent baseline but slightly worse than 0.99 3 weeks ago. - Renal function improved with hydration and creatinine currently at 0.93. - Follow.  3.  Hypertension  - Controlled on Norvasc  10 mg daily, labetalol  100 mg twice daily, Lotensin  40 mg daily.   4.  Colon cancer status post colectomy with ileostomy -Patient noted with good output from ileostomy bag. - Continue FiberCon, loperamide .  5.  GERD - Pepcid , Protonix .    6.  Chronic leg swelling -Patient noted on admission with 1+ bilateral lower extremity edema on exam unchanged. - TED hose.  7.  Generalized weakness -In the setting of acute illness. - PT/OT.  8.  Anemia - Likely anemia of chronic disease and possible hematoma noted on CT abdomen and pelvis.. - Patient with no overt bleeding. - Anemia panel from 11/08/2023 with iron  level of 15, ferritin of 467, folate of > 20.  Vitamin B12 1740. -Hemoglobin noted at 6.7 on 12/05/2023.  - Status post transfusion 2 months PRBCs with hemoglobin currently at 10.2 this morning.  - Follow H&H. - Transfusion threshold hemoglobin < 7.  9.  Hypophosphatemia/hypomagnesemia/hypokalemia - Magnesium  at 1.9.   - Phosphorus at 1.7.   -  Potassium of 4.8.   - K-Phos 30 mmol IV x 1.   - Repeat labs in AM.     DVT prophylaxis: SCDs.  Code Status: Full Family Communication: Updated patient and son at  bedside. Disposition: TBD  Status is: Inpatient Remains inpatient appropriate because: Severity of illness   Consultants:  General Surgery: Dr. Sheldon 12/04/2023 Interventional radiology: Dr. Karalee 12/04/2023 ID: Dr.Van Dam 12/06/2023  Procedures:  CT abdomen and pelvis 12/03/2023 Transfuse 2 units PRBCs. Placement of 12 French drain via left mid abdominal approach with evacuation of 250 cc of purulent fluid per IR: Dr. Karalee 12/04/2023.  Antimicrobials:  Anti-infectives (From admission, onward)    Start     Dose/Rate Route Frequency Ordered Stop   12/03/23 2300  piperacillin -tazobactam (ZOSYN ) IVPB 3.375 g        3.375 g 12.5 mL/hr over 240 Minutes Intravenous Every 8 hours 12/03/23 2247           Subjective: Patient sitting up in chair.  Still with abdominal pain with some improvement.  Denies any chest pain or shortness of breath.  Son at bedside.    Objective: Vitals:   12/06/23 1259 12/06/23 1915 12/07/23 0425 12/07/23 1320  BP: (!) 142/61 136/64 137/78 (!) 135/58  Pulse: 73 75 74 74  Resp: 17 18 17 18   Temp: 98.3 F (36.8 C) 98.5 F (36.9 C) 98.2 F (36.8 C) 98.6 F (37 C)  TempSrc:      SpO2: 95% 100% 99% 100%  Weight:      Height:        Intake/Output Summary (Last 24 hours) at 12/07/2023 1359 Last data filed at 12/07/2023 0852 Gross per 24 hour  Intake 702.07 ml  Output 350 ml  Net 352.07 ml   Filed Weights   12/03/23 1640  Weight: 75.3 kg    Examination:  General exam: NAD. Respiratory system: Lungs clear to auscultation bilaterally.  No wheezes, no crackles, no rhonchi.  Fair air movement.  Speaking in full sentences.  No use of accessory muscles of respiration.  Cardiovascular system: RRR no murmurs rubs or gallops.  No JVD.  No lower extremity edema.  Gastrointestinal system: Abdomen is soft, nondistended, tenderness to palpation right abdominal region.  Decreased tenderness to palpation in the left side.  JP drain in place with  serosanguineous drainage noted.  Ileostomy bag with stool noted. Central nervous system: Alert and oriented.  Moving extremities spontaneously.  No focal neurological deficits. Extremities: Symmetric 5 x 5 power. Skin: No rashes, lesions or ulcers Psychiatry: Judgement and insight appear normal. Mood & affect appropriate.     Data Reviewed: I have personally reviewed following labs and imaging studies  CBC: Recent Labs  Lab 12/03/23 1646 12/04/23 0505 12/05/23 0032 12/06/23 0321 12/07/23 1121  WBC 10.1 8.6 7.9 8.8 8.7  NEUTROABS  --   --  5.4 6.1  --   HGB 8.0* 7.4* 6.7* 9.4* 10.2*  HCT 22.8* 21.8* 19.6* 26.9* 29.5*  MCV 84.4 86.5 84.8 84.3 86.5  PLT 451* 424* 426* 352 377    Basic Metabolic Panel: Recent Labs  Lab 12/03/23 1646 12/04/23 0505 12/05/23 0032 12/06/23 0321 12/07/23 1121  NA 136 141 142 143 140  K 3.6 3.5 3.3* 4.3 4.8  CL 100 105 107 108 107  CO2 23 25 25 25 25   GLUCOSE 127* 98 94 93 112*  BUN 21 16 15 11 10   CREATININE 1.32* 1.07* 1.04* 0.91 0.93  CALCIUM  9.2 8.8* 8.6* 8.6* 9.2  MG  --   --  1.6* 2.1 1.9  PHOS  --   --  1.8* 2.8 1.7*    GFR: Estimated Creatinine Clearance: 50 mL/min (by C-G formula based on SCr of 0.93 mg/dL).  Liver Function Tests: Recent Labs  Lab 12/03/23 1646 12/07/23 1121  AST 29  --   ALT 23  --   ALKPHOS 133*  --   BILITOT 0.4  --   PROT 6.8  --   ALBUMIN 3.2* 3.2*    CBG: No results for input(s): GLUCAP in the last 168 hours.   Recent Results (from the past 240 hours)  Aerobic/Anaerobic Culture w Gram Stain (surgical/deep wound)     Status: Abnormal (Preliminary result)   Collection Time: 12/04/23  1:50 PM   Specimen: Abscess  Result Value Ref Range Status   Specimen Description   Final    ABSCESS Performed at [facility], [address] [place], [place] [address]    Special Requests   Final    NONE Performed at [facility], [address], [place],  [place] [address]    Gram Stain   Final    ABUNDANT WBC PRESENT, PREDOMINANTLY PMN ABUNDANT GRAM POSITIVE COCCI IN PAIRS IN CHAINS Performed at [facility], [address] [place], [place] [address]    Culture MULTIPLE ORGANISMS PRESENT, NONE PREDOMINANT (A)  Final   Report Status PENDING  Incomplete         Radiology Studies: No results found.       Scheduled Meds:  sodium chloride    Intravenous Once   acetaminophen   500 mg Oral TID WC & HS   amLODipine   10 mg Oral QHS   aspirin  EC  81 mg Oral Daily   benazepril   40 mg Oral QHS   calcium  carbonate  2 tablet Oral BID WC   famotidine   20 mg Oral Daily   feeding supplement  237 mL Oral BID BM   labetalol   100 mg Oral BID   latanoprost   1 drop Both Eyes QHS   lidocaine   15 mL Oral Once   loperamide   2 mg Oral TID   multivitamins with iron   1 tablet Oral Daily   pantoprazole   40 mg Oral BID AC   polycarbophil  625 mg Oral BID   sodium chloride  flush  10-40 mL Intracatheter Q12H   sodium chloride  flush  5 mL Intracatheter Q8H   timolol   1 drop Both Eyes Daily   Continuous Infusions:  piperacillin -tazobactam (ZOSYN )  IV 3.375 g (12/07/23 0729)   potassium PHOSPHATE  IVPB (in mmol)       LOS: 4 days    Time spent: 35 minutes    Toribio Hummer, MD [facility]   To contact the attending provider between 7A-7P or the covering provider during after hours 7P-7A, please log into the web site [email or web address] and access using universal [place] password for that web site. If you do not have the password, please call the hospital operator.  12/07/2023, 1:59 PM

## 2023-12-07 NOTE — Progress Notes (Signed)
 Referring Physician(s): Gross,S  Supervising Physician: Hughes Simmonds  Patient Status:  [facility] - In-pt  Chief Complaint:  Abdominal/back pain, post op abdominal fluid collections; s/p drainage of parastomal fluid collection ( Ultrasound confirms that both the parastomal and intraperitoneal perihepatic fluid collections decompressed through the single drainage catheter.)  Subjective: Pt doing okay this morning.  She has some soreness at ostomy site.  No apparent leaking at this time.  Denies fever, nausea, vomiting.   Allergies: Ozempic (0.25 or 0.5 mg-dose) [semaglutide(0.25 or 0.5mg -dos)], Ezetimibe, Gabapentin , Iodinated contrast media, Mesalamine, Oxycodone , Prilosec [omeprazole ], and Statins  Medications: Prior to Admission medications   Medication Sig Start Date End Date Taking? Authorizing Provider  acetaminophen  (TYLENOL ) 500 MG tablet Take 1,000 mg by mouth every 6 (six) hours as needed for mild pain or headache.   Yes [provider]  amLODipine  (NORVASC ) 10 MG tablet Take 10 mg by mouth at bedtime. 05/20/18  Yes [provider]  aspirin  EC 81 MG tablet Take 1 tablet (81 mg total) by mouth daily. Swallow whole. 03/20/21  Yes Patwardhan, Manish J, MD  B Complex Vitamins (B COMPLEX 100 PO) Take 1 tablet by mouth daily.   Yes [provider]  benazepril  (LOTENSIN ) 40 MG tablet Take 40 mg by mouth at bedtime. 05/14/18  Yes [provider]  calcium  carbonate (OS-CAL - DOSED IN MG OF ELEMENTAL CALCIUM ) 1250 (500 Ca) MG tablet Take 2 tablets by mouth 2 (two) times daily with a meal.   Yes [provider]  cyclobenzaprine  (FLEXERIL ) 5 MG tablet Take 1 tablet (5 mg total) by mouth 3 (three) times daily as needed for muscle spasms. 10/18/23  Yes Sheldon Standing, MD  Evolocumab  (REPATHA  SURECLICK) 140 MG/ML SOAJ Inject 140 mg (1 pen) into the skin every 14 days 07/08/23  Yes Patwardhan, Manish J, MD  famotidine  (PEPCID ) 20 MG tablet Take 20 mg by  mouth daily.   Yes [provider]  hydrochlorothiazide  (HYDRODIURIL ) 25 MG tablet Take 25 mg by mouth daily.   Yes [provider]  labetalol  (NORMODYNE ) 100 MG tablet TAKE 1 TABLET TWICE DAILY (NEED TO KEEP UPCOMING APPOINTMENT IN APRIL TO PREVENT ANY MISSED DOSES) Patient taking differently: Take 100 mg by mouth 2 (two) times daily. 05/03/23  Yes Patwardhan, Manish J, MD  latanoprost  (XALATAN ) 0.005 % ophthalmic solution Place 1 drop into both eyes at bedtime. 05/05/22  Yes [provider]  loperamide  (IMODIUM ) 2 MG capsule Take 1 capsule (2 mg total) by mouth 2 (two) times daily. Patient taking differently: Take 4 mg by mouth in the morning, at noon, and at bedtime. 06/04/19  Yes Teresa Lonni HERO, MD  Menthol -Methyl Salicylate (MUSCLE RUB) 10-15 % CREA Apply 1 application topically 2 (two) times daily as needed for muscle pain.   Yes [provider]  metoCLOPramide  (REGLAN ) 5 MG tablet Take 1 tablet (5 mg total) by mouth every 8 (eight) hours as needed for nausea or vomiting. 10/31/23  Yes Sheldon Standing, MD  Multiple Vitamin (MULTIVITAMIN WITH MINERALS) TABS tablet Take 1 tablet by mouth daily. Woman's one a day   Yes [provider]  pantoprazole  (PROTONIX ) 40 MG tablet Take 40 mg by mouth 2 (two) times daily before a meal.    Yes [provider]  polycarbophil (FIBERCON) 625 MG tablet Take 1 tablet (625 mg total) by mouth 2 (two) times daily. 11/12/23 12/12/23 Yes Patsy Lenis, MD  psyllium (HYDROCIL/METAMUCIL) 95 % PACK Take 1 packet by mouth daily. 10/30/23  Chaney Sheldon Standing, MD  timolol  (TIMOPTIC ) 0.5 % ophthalmic solution Place 1 drop into both eyes every morning. 06/17/22  Yes [provider]  traMADol  (ULTRAM ) 50 MG tablet Take 1 tablet (50 mg total) by mouth every 6 (six) hours as needed for moderate pain (pain score 4-6) or severe pain (pain score 7-10). 11/12/23  Yes Patsy Lenis, MD  Vitamins-Lipotropics (COMPLEX  B-100-INOSITOL) TBCR Take 1 tablet by mouth daily. 11/20/23  Yes [provider]  Multiple Vitamins-Iron  (MULTIVITAMINS WITH IRON ) TABS tablet Take 1 tablet by mouth daily. 11/13/23   Patsy Lenis, MD  traZODone  (DESYREL ) 50 MG tablet Take 50 mg by mouth at bedtime. 02/11/23   [provider]     Vital Signs: BP 137/78 (BP Location: Right Wrist)   Pulse 74   Temp 98.2 F (36.8 C)   Resp 17   Ht 5' 3 (1.6 m)   Wt 166 lb (75.3 kg)   SpO2 99%   BMI 29.41 kg/m   Physical Exam awake, alert.  Left mid abdominal drain intact, insertion site okay, minimal tenderness to palpation, output 30 cc of hazy beige-colored fluid with few tissue fragments; drain flushed without difficulty.  Imaging: IR US  Guide Bx Asp/Drain Result Date: 12/04/2023 INDICATION: 78 year old female with postoperative parastomal fluid collection in the anterior abdominal wall. EXAM: US  IMAGE GUIDED DRAINAGE BY PERCUTANEOUS CATHETE MEDICATIONS: The patient is currently admitted to the hospital and receiving intravenous antibiotics. The antibiotics were administered within an appropriate time frame prior to the initiation of the procedure. ANESTHESIA/SEDATION: Fentanyl  2 mcg IV; Versed  100 mg IV administered by the radiology nurse Moderate Sedation Time:  12 minutes The patient's vital signs and level of consciousness were continuously monitored during the procedure by the interventional radiology nurse under my direct supervision. COMPLICATIONS: None immediate. PROCEDURE: Informed written consent was obtained from the patient after a thorough discussion of the procedural risks, benefits and alternatives. All questions were addressed. Maximal Sterile Barrier Technique was utilized including caps, mask, sterile gowns, sterile gloves, sterile drape, hand hygiene and skin antiseptic. A timeout was performed prior to the initiation of the procedure. Ultrasound was used to interrogate the anterior abdominal wall. A large  complex fluid collection can be identified surrounding the stoma. Fluid also tracks into the peritoneal space around the liver. A suitable skin entry site was selected and marked. The skin was sterilely prepped and draped in the standard fashion using chlorhexidine  skin prep. Local anesthesia was attained by infiltration with 1% lidocaine . A small dermatotomy was made. Under real-time ultrasound guidance, an 18 gauge trocar needle was advanced to the fluid collection. A 0.035 wire was advanced into the collection and the trocar needle was removed. The percutaneous tract was dilated to 66 French and a 12 French skater drainage catheter was advanced over the wire and formed. Aspiration was then performed yielding 250 mL thick tan colored purulent fluid. Samples were sent for Gram stain and culture. Next, ultrasound was again used to interrogate the abdominal wall. Not only is the superficial parastomal fluid collection decompressed, but the perihepatic fluid collection has also decompressed. These fluid collections are clearly communicating. Therefore, no second drainage catheter was placed. The drainage catheter was then flushed and connected to JP bulb suction before being secured to the skin with 0 Prolene suture. IMPRESSION: Successful placement of a 12 French drainage catheter into the parastomal fluid collection with evacuation of 250 mL purulent fluid. Ultrasound confirms that both the parastomal and intraperitoneal perihepatic fluid collections decompressed  through the single drainage catheter. Specimens were sent for Gram stain and culture. Electronically Signed   By: Wilkie Lent M.D.   On: 12/04/2023 14:32   CT ABDOMEN PELVIS WO CONTRAST Result Date: 12/03/2023 EXAM: CT ABDOMEN AND PELVIS WITHOUT CONTRAST 12/03/2023 09:44:19 PM TECHNIQUE: CT of the abdomen and pelvis was performed without the administration of intravenous contrast. Multiplanar reformatted images are provided for review. Automated  exposure control, iterative reconstruction, and/or weight-based adjustment of the mA/kV was utilized to reduce the radiation dose to as low as reasonably achievable. COMPARISON: Prior examination of 11/08/2023. Remote prior examination of 01/12/2015. CLINICAL HISTORY: Abdominal pain, acute, nonlocalized; upper abd pain around colostomy site, drainage, hx of prior abscess. FINDINGS: LOWER CHEST: Scattered bibasilar ground glass opacities have developed, likely reflecting atelectasis. Extensive coronary artery calcifications. Clinical cardiac size within normal limits. Hypoattenuation of the cardiac blood pool in keeping with at least moderate anemia. LIVER: Pneumovila again noted within the liver suggesting changes of prior sphincterotomy. No intra- or extrahepatic biliary ductal dilation. Liver otherwise unremarkable. GALLBLADDER AND BILE DUCTS: Cholelithiasis without superimposed pericholecystic inflammatory change. No biliary ductal dilatation. SPLEEN: No acute abnormality. PANCREAS: No acute abnormality. ADRENAL GLANDS: Stable 18 mm nodule within the right adrenal gland, likely an adrenal adenoma and minimally enlarged when compared to remote prior examination of 01/12/2015, where this measured 16 mm. The left adrenal gland is unremarkable. KIDNEYS, URETERS AND BLADDER: Kidneys were unremarkable. Bladder unremarkable. No stones in the kidneys or ureters. No hydronephrosis. No perinephric or periureteral stranding. GI AND BOWEL: Surgical changes of proctocolectomy and right lower quadrant end ileostomy are identified. No evidence of obstruction. Stomach demonstrates no acute abnormality. PERITONEUM AND RETROPERITONEUM: No free intraperitoneal gas or fluid. VASCULATURE: Aorta is normal in caliber. No aortic aneurysm. LYMPH NODES: No lymphadenopathy. REPRODUCTIVE ORGANS: No acute abnormality. BONES AND SOFT TISSUES: Severe lumbar levoscoliosis again noted. No acute bone abnormality. Since the prior examination, the  lobulated fluid collection surrounding the ileostomy has enlarged, now measuring up to 5.7 x 12.5 cm within the subcutaneous soft tissues surrounding the terminal ileum and, superiorly, measuring 2.3 x 14.1 cm subjacent to the right upper quadrant anterior abdominal wall. Both these collections appear enlarged since the prior examination. There is, additionally, increasing punctate gas seen within this collections suggesting superinfection and / or enteric communication. There is relative hyperdensity involving several components of this fluid collection suggest that this may be in part related to hemorrhagic fluid. Lobulated fluid appears to track into the subcutaneous soft tissues of the right lateral abdominal wall through the abdominal wall musculature which may reflect dehiscence of a laparoscopic port incision. IMPRESSION: 1. Multiple lobulated fluid collections surrounding the ileostomy with increasing punctate gas, suggestive of superinfection and/or enteric communication, and relative hyperdensity suggestive of hemorrhagic fluid. These collections have enlarged since the prior examination. 2. Surgical changes of total proctocolectomy and right lower quadrant end ileostomy. 3. Cholelithiasis without superimposed pericholecystic inflammatory change. Electronically signed by: Dorethia Molt MD 12/03/2023 10:17 PM EST RP Workstation: [id number]    Labs:  CBC: Recent Labs    12/03/23 1646 12/04/23 0505 12/05/23 0032 12/06/23 0321  WBC 10.1 8.6 7.9 8.8  HGB 8.0* 7.4* 6.7* 9.4*  HCT 22.8* 21.8* 19.6* 26.9*  PLT 451* 424* 426* 352    COAGS: Recent Labs    11/09/23 0542 12/04/23 1059  INR 1.0 1.0    BMP: Recent Labs    12/03/23 1646 12/04/23 0505 12/05/23 0032 12/06/23 0321  NA 136 141 142 143  K 3.6 3.5 3.3* 4.3  CL 100 105 107 108  CO2 23 25 25 25   GLUCOSE 127* 98 94 93  BUN 21 16 15 11   CALCIUM  9.2 8.8* 8.6* 8.6*  CREATININE 1.32* 1.07* 1.04* 0.91  GFRNONAA 42* 54* 55* >60     LIVER FUNCTION TESTS: Recent Labs    11/08/23 1114 11/09/23 0542 12/03/23 1646  BILITOT 0.5 0.6 0.4  AST 24 25 29   ALT 18 15 23   ALKPHOS 133* 116 133*  PROT 6.8 6.2* 6.8  ALBUMIN 3.3* 3.0* 3.2*    Assessment and Plan: 78 y.o. female with medical history significant for ulcerative colitis, colon cancer s/p colectomy with ileostomy, CKD-3A, GERD, DDD, OSA, incarcerated abdominal wall hernia s/p surgery/repair on 10/18/23 who presented to the ED for evaluation of pain and drainage around her abdominal incision site.  Patient recently admitted from 11/7 - 11/11 for AKI, possible abdominal abscess around colectomy, and underwent  aspiration of abdominal fluid collection on 11/09/23 with neg culture.  She now presents with persistent abdominal pain and some drainage around surgical site since this past weekend.; on 12/3 had successful placement of a 12 French drainage catheter into the parastomal fluid collection with evacuation of 250 mL purulent fluid. Ultrasound confirmed that both the parastomal and intraperitoneal perihepatic fluid collections decompressed through the single drainage catheter.  Afebrile, most recent WBC normal, creatinine normal, drain fluid cultures pending.  Continue with current treatment, drain irrigation, lab monitoring.  Once drain output minimal obtain follow-up imaging including drain injection.  Further plans as per CCS/TRH.   Electronically Signed: D. Franky Rakers, PA-C 12/07/2023, 10:09 AM   I spent a total of 15 minutes  at the the patient's bedside AND on the patient's hospital floor or unit, greater than 50% of which was counseling/coordinating care for abdominal drain    Patient ID: Donna [name], female   DOB: 1945/06/25, 78 y.o.   MRN: [id number]

## 2023-12-08 DIAGNOSIS — Z9889 Other specified postprocedural states: Secondary | ICD-10-CM | POA: Diagnosis not present

## 2023-12-08 DIAGNOSIS — B999 Unspecified infectious disease: Secondary | ICD-10-CM | POA: Diagnosis not present

## 2023-12-08 DIAGNOSIS — N1831 Chronic kidney disease, stage 3a: Secondary | ICD-10-CM | POA: Diagnosis not present

## 2023-12-08 DIAGNOSIS — Z932 Ileostomy status: Secondary | ICD-10-CM | POA: Diagnosis not present

## 2023-12-08 LAB — CBC WITH DIFFERENTIAL/PLATELET
Abs Immature Granulocytes: 0.08 K/uL — ABNORMAL HIGH (ref 0.00–0.07)
Basophils Absolute: 0.1 K/uL (ref 0.0–0.1)
Basophils Relative: 1 %
Eosinophils Absolute: 0.4 K/uL (ref 0.0–0.5)
Eosinophils Relative: 4 %
HCT: 31.1 % — ABNORMAL LOW (ref 36.0–46.0)
Hemoglobin: 10.3 g/dL — ABNORMAL LOW (ref 12.0–15.0)
Immature Granulocytes: 1 %
Lymphocytes Relative: 10 %
Lymphs Abs: 0.9 K/uL (ref 0.7–4.0)
MCH: 28.9 pg (ref 26.0–34.0)
MCHC: 33.1 g/dL (ref 30.0–36.0)
MCV: 87.1 fL (ref 80.0–100.0)
Monocytes Absolute: 1 K/uL (ref 0.1–1.0)
Monocytes Relative: 11 %
Neutro Abs: 6.5 K/uL (ref 1.7–7.7)
Neutrophils Relative %: 73 %
Platelets: 362 K/uL (ref 150–400)
RBC: 3.57 MIL/uL — ABNORMAL LOW (ref 3.87–5.11)
RDW: 17.2 % — ABNORMAL HIGH (ref 11.5–15.5)
WBC: 8.9 K/uL (ref 4.0–10.5)
nRBC: 0 % (ref 0.0–0.2)

## 2023-12-08 LAB — RENAL FUNCTION PANEL
Albumin: 3.3 g/dL — ABNORMAL LOW (ref 3.5–5.0)
Anion gap: 10 (ref 5–15)
BUN: 13 mg/dL (ref 8–23)
CO2: 25 mmol/L (ref 22–32)
Calcium: 9.8 mg/dL (ref 8.9–10.3)
Chloride: 107 mmol/L (ref 98–111)
Creatinine, Ser: 0.91 mg/dL (ref 0.44–1.00)
GFR, Estimated: >60 mL/min (ref >60)
Glucose, Bld: 103 mg/dL — ABNORMAL HIGH (ref 70–99)
Phosphorus: 2.4 mg/dL — ABNORMAL LOW (ref 2.5–4.6)
Potassium: 4.6 mmol/L (ref 3.5–5.1)
Sodium: 142 mmol/L (ref 135–145)

## 2023-12-08 LAB — MAGNESIUM: Magnesium: 1.9 mg/dL (ref 1.7–2.4)

## 2023-12-08 MED ORDER — K PHOS MONO-SOD PHOS DI & MONO 155-852-130 MG PO TABS
250.0000 mg | ORAL_TABLET | Freq: Two times a day (BID) | ORAL | Status: DC
  Administered 2023-12-08 – 2023-12-09 (×2): 250 mg via ORAL
  Filled 2023-12-08 (×2): qty 1

## 2023-12-08 NOTE — Plan of Care (Signed)

## 2023-12-08 NOTE — Progress Notes (Signed)
 PROGRESS NOTE    Donna [name]  FMW:[id number] DOB: January 21, 1945 DOA: 12/03/2023 PCP: Clarice Nottingham, MD    Chief Complaint  Patient presents with   Post-op Problem    Brief Narrative:  Donna [name] is a 78 y.o. female with medical history significant for ulcerative colitis, colon cancer s/p colectomy with ileostomy, CKD-3A, GERD, DDD, OSA, incarcerated abdominal wall hernia s/p surgery/repair on 10/18/23 who presented to the ED for evaluation of pain and drainage around her abdominal incision site and admitted for intra-abdominal infection.  Patient placed empirically on IV antibiotics.  General surgery consulted.  IR also consulted for drain placement per general surgery.     Assessment & Plan:   Principal Problem:   Intra-abdominal infection Active Problems:   Hypertension   Status post ileostomy (HCC)   History of hernia repair   CKD stage 3a, GFR 45-59 ml/min (HCC)   Generalized weakness   Anemia  #1 intra-abdominal infection/history of incarcerated abdominal wall hernia s/p repair -Patient noted with multiple hospitalizations over the past month for complications related to recent hernia repair. - Patient presented with diffuse abdominal pain x 3 to 4 days prior to admission with increased drainage and pain around surgical site. - CT abdomen and pelvis with multiple loculated fluid collections surrounding the ileostomy with increasing punctuate gas, suggestive of superinfection and/or enteric communication and relative hyperdensity suggestive of hemorrhagic fluid collections that have enlarged since prior examination. - Patient seen in consultation by general surgery who has asked IR to place drains in collections for now and cultures to be obtained. - It is noted per general surgery the last fluid collection aspiration of the abdominal fluid collection 11/8 was consistent with seroma and cultures negative. -Patient seen by IR and patient status post 12 French  drain placement and left mid abdominal region with evacuation of 250 cc of purulent fluid, 12/04/2023. -Preliminary cultures with abundant GPC in pairs and chains, multiple organisms present, none predominant.. - Continue empiric IV Zosyn . -Patient seen in consultation by ID who are recommending continuation of IV Zosyn  for now, follow-up abscess cultures and feel patient would likely need protracted antibiotics with close follow-up with ID, general surgery and radiology. - Per general surgery.    #2  CKD 3A -Creatinine noted at 1.32 which is not significantly different from recent baseline but slightly worse than 0.99 3 weeks ago. - Renal function improved with hydration and creatinine currently at 0.91. - Follow.  3.  Hypertension  - Continue Norvasc  10 mg daily, labetalol  100 mg twice daily, Lotensin  40 mg daily.   4.  Colon cancer status post colectomy with ileostomy -Patient noted with good output from ileostomy bag. - Continue FiberCon, loperamide .  5.  GERD - Protonix , Pepcid .   6.  Chronic leg swelling -Patient noted on admission with 1+ bilateral lower extremity edema on exam unchanged. - TED hose.  7.  Generalized weakness -In the setting of acute illness. - PT/OT following and recommending home health.  8.  Anemia - Likely anemia of chronic disease and possible hematoma noted on CT abdomen and pelvis.. - Patient with no overt bleeding. - Anemia panel from 11/08/2023 with iron  level of 15, ferritin of 467, folate of > 20.  Vitamin B12 1740. -Hemoglobin noted at 6.7 on 12/05/2023.  - Status post transfusion 2 months PRBCs with hemoglobin currently at 10.3 this morning.  - Follow H&H. - Transfusion threshold hemoglobin < 7.  9.  Hypophosphatemia/hypomagnesemia/hypokalemia - Magnesium  at 1.9.   -  Phosphorus at 2.4. - Potassium of 4.6.   - K-Phos 250 mg p.o. twice daily x 3 days.    - Repeat labs in AM.     DVT prophylaxis: SCDs.  Code Status: Full Family  Communication: Updated patient.  No family at bedside.  Disposition: TBD  Status is: Inpatient Remains inpatient appropriate because: Severity of illness   Consultants:  General Surgery: Dr. Sheldon 12/04/2023 Interventional radiology: Dr. Karalee 12/04/2023 ID: Dr.Van Dam 12/06/2023  Procedures:  CT abdomen and pelvis 12/03/2023 Transfuse 2 units PRBCs. Placement of 12 French drain via left mid abdominal approach with evacuation of 250 cc of purulent fluid per IR: Dr. Karalee 12/04/2023.  Antimicrobials:  Anti-infectives (From admission, onward)    Start     Dose/Rate Route Frequency Ordered Stop   12/03/23 2300  piperacillin -tazobactam (ZOSYN ) IVPB 3.375 g        3.375 g 12.5 mL/hr over 240 Minutes Intravenous Every 8 hours 12/03/23 2247           Subjective: Patient sitting up in recliner.  Still with abdominal pain but feels is slowly getting better.  Denies any chest pain or shortness of breath.  Tolerating current diet.   Objective: Vitals:   12/06/23 1915 12/07/23 0425 12/07/23 1320 12/07/23 1958  BP: 136/64 137/78 (!) 135/58 (!) 140/61  Pulse: 75 74 74 80  Resp: 18 17 18 18   Temp: 98.5 F (36.9 C) 98.2 F (36.8 C) 98.6 F (37 C) 98.7 F (37.1 C)  TempSrc:    Oral  SpO2: 100% 99% 100% 98%  Weight:      Height:        Intake/Output Summary (Last 24 hours) at 12/08/2023 1229 Last data filed at 12/07/2023 2131 Gross per 24 hour  Intake 92.06 ml  Output 15 ml  Net 77.06 ml   Filed Weights   12/03/23 1640  Weight: 75.3 kg    Examination:  General exam: NAD. Respiratory system: CTAB.  No wheezes, no crackles, no rhonchi.  Fair air movement.  Speaking in full sentences.  No use of accessory muscles of respiration.  Cardiovascular system: Regular rate rhythm no murmurs rubs or gallops.  No JVD.  No lower extremity edema. Gastrointestinal system: Abdomen is soft, nondistended, slightly decreased tenderness to palpation right abdominal region.  Decreased  tenderness to palpation in the left side.  JP drain in place with serosanguineous drainage noted.  Ileostomy bag with stool noted. Central nervous system: Alert and oriented.  Moving extremities spontaneously.  No focal neurological deficits. Extremities: Symmetric 5 x 5 power. Skin: No rashes, lesions or ulcers Psychiatry: Judgement and insight appear normal. Mood & affect appropriate.     Data Reviewed: I have personally reviewed following labs and imaging studies  CBC: Recent Labs  Lab 12/04/23 0505 12/05/23 0032 12/06/23 0321 12/07/23 1121 12/08/23 0606  WBC 8.6 7.9 8.8 8.7 8.9  NEUTROABS  --  5.4 6.1  --  6.5  HGB 7.4* 6.7* 9.4* 10.2* 10.3*  HCT 21.8* 19.6* 26.9* 29.5* 31.1*  MCV 86.5 84.8 84.3 86.5 87.1  PLT 424* 426* 352 377 362    Basic Metabolic Panel: Recent Labs  Lab 12/04/23 0505 12/05/23 0032 12/06/23 0321 12/07/23 1121 12/08/23 0606  NA 141 142 143 140 142  K 3.5 3.3* 4.3 4.8 4.6  CL 105 107 108 107 107  CO2 25 25 25 25 25   GLUCOSE 98 94 93 112* 103*  BUN 16 15 11 10 13   CREATININE 1.07* 1.04* 0.91  0.93 0.91  CALCIUM  8.8* 8.6* 8.6* 9.2 9.8  MG  --  1.6* 2.1 1.9 1.9  PHOS  --  1.8* 2.8 1.7* 2.4*    GFR: Estimated Creatinine Clearance: 51.1 mL/min (by C-G formula based on SCr of 0.91 mg/dL).  Liver Function Tests: Recent Labs  Lab 12/03/23 1646 12/07/23 1121 12/08/23 0606  AST 29  --   --   ALT 23  --   --   ALKPHOS 133*  --   --   BILITOT 0.4  --   --   PROT 6.8  --   --   ALBUMIN 3.2* 3.2* 3.3*    CBG: No results for input(s): GLUCAP in the last 168 hours.   Recent Results (from the past 240 hours)  Aerobic/Anaerobic Culture w Gram Stain (surgical/deep wound)     Status: None (Preliminary result)   Collection Time: 12/04/23  1:50 PM   Specimen: Abscess  Result Value Ref Range Status   Specimen Description   Final    ABSCESS Performed at [facility], [address] [place], [place] [address]    Special  Requests   Final    NONE Performed at [facility], [address], [place], [place] [address]    Gram Stain   Final    ABUNDANT WBC PRESENT, PREDOMINANTLY PMN ABUNDANT GRAM POSITIVE COCCI IN PAIRS IN CHAINS    Culture   Final    CULTURE REINCUBATED FOR BETTER GROWTH Performed at [facility], [address], [place], [place] [address]    Report Status PENDING  Incomplete         Radiology Studies: No results found.       Scheduled Meds:  sodium chloride    Intravenous Once   acetaminophen   500 mg Oral TID WC & HS   amLODipine   10 mg Oral QHS   aspirin  EC  81 mg Oral Daily   benazepril   40 mg Oral QHS   calcium  carbonate  2 tablet Oral BID WC   famotidine   20 mg Oral Daily   feeding supplement  237 mL Oral BID BM   labetalol   100 mg Oral BID   latanoprost   1 drop Both Eyes QHS   lidocaine   15 mL Oral Once   loperamide   2 mg Oral TID   multivitamins with iron   1 tablet Oral Daily   pantoprazole   40 mg Oral BID AC   phosphorus  250 mg Oral BID   polycarbophil  625 mg Oral BID   sodium chloride  flush  10-40 mL Intracatheter Q12H   sodium chloride  flush  5 mL Intracatheter Q8H   timolol   1 drop Both Eyes Daily   Continuous Infusions:  piperacillin -tazobactam (ZOSYN )  IV 3.375 g (12/08/23 0730)     LOS: 5 days    Time spent: 35 minutes    Toribio Hummer, MD [facility]   To contact the attending provider between 7A-7P or the covering provider during after hours 7P-7A, please log into the web site [email or web address] and access using universal [place] password for that web site. If you do not have the password, please call the hospital operator.  12/08/2023, 12:29 PM

## 2023-12-08 NOTE — Progress Notes (Signed)
 Mobility Specialist - Progress Note   12/08/23 1529  Mobility  Activity Ambulated with assistance  Level of Assistance Standby assist, set-up cues, supervision of patient - no hands on  Assistive Device Front wheel walker  Distance Ambulated (ft) 400 ft  Range of Motion/Exercises Active  Activity Response Tolerated well  Mobility visit 1 Mobility  Mobility Specialist Start Time (ACUTE ONLY) 1515  Mobility Specialist Stop Time (ACUTE ONLY) 1529  Mobility Specialist Time Calculation (min) (ACUTE ONLY) 14 min   Pt was found in bathroom and agreeable to mobilize. Had x2 brief standing rest breaks due to fatigue. At EOS returned to bed with all needs met. Call bell in reach and RN in room.   Erminio Leos,  Mobility Specialist Can be reached via Secure Chat

## 2023-12-08 NOTE — Progress Notes (Signed)
       Subjective: No acute changes. Remains afebrile. Drain cultures with multiple flora, no speciation.   Objective: Vital signs in last 24 hours: Temp:  [98.6 F (37 C)-98.7 F (37.1 C)] 98.7 F (37.1 C) (12/06 1958) Pulse Rate:  [74-80] 80 (12/06 1958) Resp:  [18] 18 (12/06 1958) BP: (135-140)/(58-61) 140/61 (12/06 1958) SpO2:  [98 %-100 %] 98 % (12/06 1958) Last BM Date : 12/07/23 (Ileostomy)  Intake/Output from previous day: 12/06 0701 - 12/07 0700 In: 92.1 [IV Piggyback:92.1] Out: 65 [Drains:65] Intake/Output this shift: No intake/output data recorded.  PE: General: sitting up in bed, NAD Neuro: alert and oriented, no focal deficits Resp: normal work of breathing on room air Abdomen: soft, nondistended. Ileostomy productive of stool. LUQ drain with seropurulent fluid. Extremities: warm and well-perfused   Lab Results:  Recent Labs    12/07/23 1121 12/08/23 0606  WBC 8.7 8.9  HGB 10.2* 10.3*  HCT 29.5* 31.1*  PLT 377 362   BMET Recent Labs    12/07/23 1121 12/08/23 0606  NA 140 142  K 4.8 4.6  CL 107 107  CO2 25 25  GLUCOSE 112* 103*  BUN 10 13  CREATININE 0.93 0.91  CALCIUM  9.2 9.8   PT/INR No results for input(s): LABPROT, INR in the last 72 hours.  CMP     Component Value Date/Time   NA 142 12/08/2023 0606   NA 144 04/30/2022 0820   K 4.6 12/08/2023 0606   CL 107 12/08/2023 0606   CO2 25 12/08/2023 0606   GLUCOSE 103 (H) 12/08/2023 0606   BUN 13 12/08/2023 0606   BUN 24 04/30/2022 0820   CREATININE 0.91 12/08/2023 0606   CALCIUM  9.8 12/08/2023 0606   PROT 6.8 12/03/2023 1646   ALBUMIN 3.3 (L) 12/08/2023 0606   AST 29 12/03/2023 1646   ALT 23 12/03/2023 1646   ALKPHOS 133 (H) 12/03/2023 1646   BILITOT 0.4 12/03/2023 1646   GFRNONAA >60 12/08/2023 0606   GFRAA >60 06/04/2019 0413   Lipase     Component Value Date/Time   LIPASE 20 12/03/2023 1646       Studies/Results: No results  found.  Anti-infectives: Anti-infectives (From admission, onward)    Start     Dose/Rate Route Frequency Ordered Stop   12/03/23 2300  piperacillin -tazobactam (ZOSYN ) IVPB 3.375 g        3.375 g 12.5 mL/hr over 240 Minutes Intravenous Every 8 hours 12/03/23 2247          Assessment/Plan 78 yo female with a history of total abdominal colectomy and end ileostomy, now s/p parastomal hernia repair with mesh. She has an infected fluid collection with a percutaneous drain in place.  - Drain cultures with multiple organisms, no speciation yet - Will need antibiotics for several weeks, appreciate ID recommendations - From a surgical standpoint patient is ok for discharge once a discharge antibiotic plan is in place. Will continue to follow while admitted.    LOS: 5 days    Leonor Dawn, MD [facility] Surgery General, Hepatobiliary and Pancreatic Surgery 12/08/23 8:35 AM

## 2023-12-08 NOTE — Progress Notes (Signed)
      I have asked micro to workup the mULTIPLE organisms for ID and sensis so they will sub those out today.  Should be a few days but it is WELL worth knowing what we are dealing with to see if we have some oral abx options--we might not btw.    Jomarie Fleeta Rothman 12/08/2023, 10:34 AM

## 2023-12-09 ENCOUNTER — Other Ambulatory Visit: Payer: Self-pay

## 2023-12-09 ENCOUNTER — Other Ambulatory Visit (HOSPITAL_COMMUNITY): Payer: Self-pay

## 2023-12-09 DIAGNOSIS — E860 Dehydration: Secondary | ICD-10-CM | POA: Diagnosis not present

## 2023-12-09 DIAGNOSIS — Z932 Ileostomy status: Secondary | ICD-10-CM | POA: Diagnosis not present

## 2023-12-09 DIAGNOSIS — K635 Polyp of colon: Secondary | ICD-10-CM | POA: Diagnosis not present

## 2023-12-09 DIAGNOSIS — A499 Bacterial infection, unspecified: Secondary | ICD-10-CM

## 2023-12-09 DIAGNOSIS — Z85038 Personal history of other malignant neoplasm of large intestine: Secondary | ICD-10-CM

## 2023-12-09 DIAGNOSIS — B954 Other streptococcus as the cause of diseases classified elsewhere: Secondary | ICD-10-CM

## 2023-12-09 DIAGNOSIS — K9412 Enterostomy infection: Secondary | ICD-10-CM

## 2023-12-09 DIAGNOSIS — B961 Klebsiella pneumoniae [K. pneumoniae] as the cause of diseases classified elsewhere: Secondary | ICD-10-CM

## 2023-12-09 LAB — CBC WITH DIFFERENTIAL/PLATELET
Abs Immature Granulocytes: 0.08 K/uL — ABNORMAL HIGH (ref 0.00–0.07)
Basophils Absolute: 0.1 K/uL (ref 0.0–0.1)
Basophils Relative: 1 %
Eosinophils Absolute: 0.4 K/uL (ref 0.0–0.5)
Eosinophils Relative: 5 %
HCT: 30.1 % — ABNORMAL LOW (ref 36.0–46.0)
Hemoglobin: 9.8 g/dL — ABNORMAL LOW (ref 12.0–15.0)
Immature Granulocytes: 1 %
Lymphocytes Relative: 14 %
Lymphs Abs: 1 K/uL (ref 0.7–4.0)
MCH: 29.2 pg (ref 26.0–34.0)
MCHC: 32.6 g/dL (ref 30.0–36.0)
MCV: 89.6 fL (ref 80.0–100.0)
Monocytes Absolute: 0.8 K/uL (ref 0.1–1.0)
Monocytes Relative: 12 %
Neutro Abs: 4.8 K/uL (ref 1.7–7.7)
Neutrophils Relative %: 67 %
Platelets: 348 K/uL (ref 150–400)
RBC: 3.36 MIL/uL — ABNORMAL LOW (ref 3.87–5.11)
RDW: 17.8 % — ABNORMAL HIGH (ref 11.5–15.5)
WBC: 7.2 K/uL (ref 4.0–10.5)
nRBC: 0 % (ref 0.0–0.2)

## 2023-12-09 LAB — RENAL FUNCTION PANEL
Albumin: 3.2 g/dL — ABNORMAL LOW (ref 3.5–5.0)
Anion gap: 10 (ref 5–15)
BUN: 13 mg/dL (ref 8–23)
CO2: 24 mmol/L (ref 22–32)
Calcium: 9.3 mg/dL (ref 8.9–10.3)
Chloride: 108 mmol/L (ref 98–111)
Creatinine, Ser: 0.93 mg/dL (ref 0.44–1.00)
GFR, Estimated: >60 mL/min (ref >60)
Glucose, Bld: 103 mg/dL — ABNORMAL HIGH (ref 70–99)
Phosphorus: 2 mg/dL — ABNORMAL LOW (ref 2.5–4.6)
Potassium: 4.1 mmol/L (ref 3.5–5.1)
Sodium: 142 mmol/L (ref 135–145)

## 2023-12-09 LAB — MAGNESIUM: Magnesium: 1.8 mg/dL (ref 1.7–2.4)

## 2023-12-09 MED ORDER — K PHOS MONO-SOD PHOS DI & MONO 155-852-130 MG PO TABS: 250.0000 mg | ORAL_TABLET | Freq: Two times a day (BID) | ORAL | Status: DC

## 2023-12-09 MED ORDER — POTASSIUM PHOSPHATES 15 MMOLE/5ML IV SOLN
30.0000 mmol | Freq: Once | INTRAVENOUS | Status: AC
  Administered 2023-12-09: 30 mmol via INTRAVENOUS
  Filled 2023-12-09: qty 10

## 2023-12-09 NOTE — Plan of Care (Signed)
  Problem: Education: Goal: Knowledge of General Education information will improve Description: Including pain rating scale, medication(s)/side effects and non-pharmacologic comfort measures Outcome: Progressing   Problem: Clinical Measurements: Goal: Ability to maintain clinical measurements within normal limits will improve Outcome: Progressing Goal: Will remain free from infection Outcome: Progressing   Problem: Nutrition: Goal: Adequate nutrition will be maintained Outcome: Progressing   Problem: Coping: Goal: Level of anxiety will decrease Outcome: Progressing   Problem: Elimination: Goal: Will not experience complications related to bowel motility Outcome: Progressing Goal: Will not experience complications related to urinary retention Outcome: Progressing   Problem: Pain Managment: Goal: General experience of comfort will improve and/or be controlled Outcome: Progressing   Problem: Safety: Goal: Ability to remain free from injury will improve Outcome: Progressing

## 2023-12-09 NOTE — Progress Notes (Signed)
 Subjective: No new complaints   Antibiotics:  Anti-infectives (From admission, onward)    Start     Dose/Rate Route Frequency Ordered Stop   12/03/23 2300  piperacillin -tazobactam (ZOSYN ) IVPB 3.375 g        3.375 g 12.5 mL/hr over 240 Minutes Intravenous Every 8 hours 12/03/23 2247         Medications: Scheduled Meds:  sodium chloride    Intravenous Once   acetaminophen   500 mg Oral TID WC & HS   amLODipine   10 mg Oral QHS   aspirin  EC  81 mg Oral Daily   benazepril   40 mg Oral QHS   calcium  carbonate  2 tablet Oral BID WC   famotidine   20 mg Oral Daily   feeding supplement  237 mL Oral BID BM   labetalol   100 mg Oral BID   latanoprost   1 drop Both Eyes QHS   lidocaine   15 mL Oral Once   loperamide   2 mg Oral TID   multivitamins with iron   1 tablet Oral Daily   pantoprazole   40 mg Oral BID AC   [START ON 12/10/2023] phosphorus  250 mg Oral BID   polycarbophil  625 mg Oral BID   sodium chloride  flush  10-40 mL Intracatheter Q12H   sodium chloride  flush  5 mL Intracatheter Q8H   timolol   1 drop Both Eyes Daily   Continuous Infusions:  piperacillin -tazobactam (ZOSYN )  IV 3.375 g (12/09/23 1417)   potassium PHOSPHATE  IVPB (in mmol) 30 mmol (12/09/23 1030)   PRN Meds:.bisacodyl , HYDROcodone -acetaminophen , HYDROmorphone  (DILAUDID ) injection, loperamide , methocarbamol  (ROBAXIN ) injection, methocarbamol , ondansetron  **OR** ondansetron  (ZOFRAN ) IV, mouth rinse, sodium chloride  flush    Objective: Weight change:   Intake/Output Summary (Last 24 hours) at 12/09/2023 1437 Last data filed at 12/09/2023 1407 Gross per 24 hour  Intake 480 ml  Output 140 ml  Net 340 ml   Blood pressure (!) 129/55, pulse 77, temperature 98.5 F (36.9 C), resp. rate 19, height 5' 3 (1.6 m), weight 75.3 kg, SpO2 96%. Temp:  [98.5 F (36.9 C)-98.9 F (37.2 C)] 98.5 F (36.9 C) (12/08 1320) Pulse Rate:  [77-82] 77 (12/08 1320) Resp:  [16-19] 19 (12/08 1320) BP: (127-142)/(55-65)  129/55 (12/08 1320) SpO2:  [95 %-98 %] 96 % (12/08 1320)  Physical Exam: Physical Exam Constitutional:      General: She is not in acute distress.    Appearance: She is well-developed. She is not diaphoretic.  HENT:     Head: Normocephalic and atraumatic.     Right Ear: External ear normal.     Left Ear: External ear normal.     Mouth/Throat:     Pharynx: No oropharyngeal exudate.  Eyes:     General: No scleral icterus.    Conjunctiva/sclera: Conjunctivae normal.     Pupils: Pupils are equal, round, and reactive to light.  Cardiovascular:     Rate and Rhythm: Normal rate and regular rhythm.  Pulmonary:     Effort: Pulmonary effort is normal. No respiratory distress.     Breath sounds: No wheezing.  Abdominal:     Palpations: Abdomen is soft.     Comments: Ostomy clean  Musculoskeletal:        General: No tenderness. Normal range of motion.  Lymphadenopathy:     Cervical: No cervical adenopathy.  Skin:    General: Skin is warm and dry.     Coloration: Skin is not pale.     Findings:  No erythema or rash.  Neurological:     General: No focal deficit present.     Mental Status: She is alert and oriented to person, place, and time.     Motor: No abnormal muscle tone.     Coordination: Coordination normal.  Psychiatric:        Mood and Affect: Mood normal.        Behavior: Behavior normal.        Thought Content: Thought content normal.        Judgment: Judgment normal.      CBC:    BMET Recent Labs    12/08/23 0606 12/09/23 0531  NA 142 142  K 4.6 4.1  CL 107 108  CO2 25 24  GLUCOSE 103* 103*  BUN 13 13  CREATININE 0.91 0.93  CALCIUM  9.8 9.3     Liver Panel  Recent Labs    12/08/23 0606 12/09/23 0531  ALBUMIN 3.3* 3.2*       Sedimentation Rate No results for input(s): ESRSEDRATE in the last 72 hours. C-Reactive Protein No results for input(s): CRP in the last 72 hours.  Micro Results: Recent Results (from the past 720 hours)   Aerobic/Anaerobic Culture w Gram Stain (surgical/deep wound)     Status: None (Preliminary result)   Collection Time: 12/04/23  1:50 PM   Specimen: Abscess  Result Value Ref Range Status   Specimen Description   Final    ABSCESS Performed at [facility], [address] [place], [place] [address]    Special Requests   Final    NONE Performed at [facility], [address], [place], [place] [address]    Gram Stain   Final    ABUNDANT WBC PRESENT, PREDOMINANTLY PMN ABUNDANT GRAM POSITIVE COCCI IN PAIRS IN CHAINS    Culture   Final    FEW KLEBSIELLA PNEUMONIAE RARE GRAM NEGATIVE RODS IDENTIFICATION AND SUSCEPTIBILITIES TO FOLLOW ABUNDANT STREPTOCOCCUS INTERMEDIUS SUSCEPTIBILITIES TO FOLLOW ABUNDANT EIKENELLA CORRODENS Usually susceptible to penicillin and other beta lactam agents,quinolones,macrolides and tetracyclines. NO ANAEROBES ISOLATED Performed at [facility], [address] [place], [place] [address]    Report Status PENDING  Incomplete   Organism ID, Bacteria KLEBSIELLA PNEUMONIAE  Final      Susceptibility   Klebsiella pneumoniae - MIC*    AMPICILLIN >=32 RESISTANT Resistant     CEFAZOLIN  (NON-URINE) 2 SENSITIVE Sensitive     CEFEPIME  <=0.12 SENSITIVE Sensitive     ERTAPENEM <=0.12 SENSITIVE Sensitive     CEFTRIAXONE <=0.25 SENSITIVE Sensitive     CIPROFLOXACIN  <=0.06 SENSITIVE Sensitive     GENTAMICIN <=1 SENSITIVE Sensitive     MEROPENEM <=0.25 SENSITIVE Sensitive     TRIMETH/SULFA <=20 SENSITIVE Sensitive     AMPICILLIN/SULBACTAM 8 SENSITIVE Sensitive     PIP/TAZO Value in next row Sensitive      <=4 SENSITIVEThis is a modified FDA-approved test that has been validated and its performance characteristics determined by the reporting laboratory.  This laboratory is certified under the Clinical Laboratory Improvement Amendments CLIA as qualified to perform high complexity clinical laboratory testing.    * FEW  KLEBSIELLA PNEUMONIAE    Studies/Results: No results found.    Assessment/Plan:  INTERVAL HISTORY: Microbiology working up sensitivity on organisms   Principal Problem:   Intra-abdominal infection Active Problems:   Hypertension   Status post ileostomy (HCC)   History of hernia repair   CKD stage 3a, GFR 45-59 ml/min (HCC)   Generalized  weakness   Anemia    Donna [name] is a 78 y.o. female with past medical history significant for ulcerative colitis, colon cancer status post curative partial colectomy ileostomy creation who had an incarcerated abdominal wall hernia status post surgery repair October 1725 now admitted with an abscess around the ileostomy and also in proximity to her mesh.  She is status post IR guided drainage of pus from this area with cultures yielding multiple organisms which I have asked microbiology to workup.  So far microbiology has isolated the Klebsiella pneumoniae which is resistant only to ampicillin as well as abundant Streptococcus intermedius and abundant Eikenella corrodens.  I agree with Dr. Sheldon that she should have a repeat CT scan to ensure that it her abscesses are resolving as well as drain study  I also agree about prolonged antibiotics in particular if she does not have mesh removal sometime in the near future.  I am hoping that the organisms isolated from her abdomen will be sensitive to oral antibiotics Zosyn  will be quite fine covering everything that has been isolated so far  I personally spent a total of 50 minutes in the care of the patient today including preparing to see the patient, getting/reviewing separately obtained history, performing a medically appropriate exam/evaluation, counseling and educating, placing orders, referring and communicating with other health care professionals, documenting clinical information in the EHR, and independently interpreting results.  Evaluation of the patient requires complex  antimicrobial therapy evaluation, counseling , isolation needs to reduce disease transmission and risk assessment and mitigation.      LOS: 6 days   Jomarie Fleeta Rothman 12/09/2023, 2:37 PM

## 2023-12-09 NOTE — Consult Note (Signed)
 Consult for ileostomy leakage, via Secure Chat reviewed with primary nurse steps to ensure good seal and went over crusting technique, staff to use ostomy #6 with No-sting barrier wipes, stated each step and warming up wafer, making sure to cut it within 1/8 clearance around stoma. I recall from last admission that this patient is familiar with her ostomy care and management. Nursing staff to follow ostomy care orders using crusting to make sure appliance stays well adhered to skin. Apply light pressure to bring together ring and wafer.   1pc. Flat pouch (lawson # [phone]), barrier ring (lawson # [phone])    WOC will not follow and will remove patient from census task list. Please reconsult if skin condition worsens and notify provider.   Sherrilyn Hals MSN RN CWOCN WOC [employer]  [phone] (Available from 7-3 pm Mon-Friday) WOC team may be working remotely during winter storms.

## 2023-12-09 NOTE — Progress Notes (Addendum)
 12/09/2023  Shalae Belmonte Spraker [id number] 1945-10-02  CARE TEAM: PCP: Clarice Nottingham, MD  Outpatient Care Team: Patient Care Team: Clarice Nottingham, MD as PCP - General (Internal Medicine) Elmira Newman PARAS, MD as PCP - Cardiology (Cardiology) Teresa Lonni HERO, MD as Consulting Physician (Colon and Rectal Surgery) Sheldon Standing, MD as Consulting Physician (General Surgery) Donnald Charleston, MD as Consulting Physician (Gastroenterology) Elmira Newman PARAS, MD as Consulting Physician (Cardiology)  Inpatient Treatment Team: Treatment Team:  Sebastian Toribio GAILS, MD Bobbette File, MD Sheldon Standing, MD Ruthellen Ruthellen Radiology, MD Grady Chum, LPN Claudene Shela MATSU, NT [place], [place], [facility] Estelle Hunter DEL, RN Doneta Glenys DASEN, RN Georgina Meth D   Problem List:   Principal Problem:   Intra-abdominal infection Active Problems:   Hypertension   Status post ileostomy [facility])   History of hernia repair   CKD stage 3a, GFR 45-59 ml/min (HCC)   Generalized weakness   Anemia   10/18/2023  POST-OPERATIVE DIAGNOSIS:  PARASTOMAL HERNIA AND VENTRAL INCISIONAL RECURRENT AND INCARCERATED ABDOMINAL WALL HERNIAE  Dimensions of hernia post-op:  16cm x 8cm   PROCEDURE:   ROBOTIC COMPONENT SEPARATION (transversus abdominis release = TAR) on right Repair of incarcerated recurrent incisional and parastomal hernias with mesh ROBOTIC LYSIS OF ADHESIONS SEROSAL REPAIR x2 END ILEOSTOMY REVISION TAP BLOCK - BILATERAL   SURGEON:  Standing KYM Sheldon, MD  OR FINDINGS:  Moderately dense adhesions small bowel to anterior abdominal wall and periumbilical midline hernia.  Swiss cheese hernias extending up to the subxiphoid region.  15 cm in length.  Moderate parastomal hernia around end ileostomy with a foot of small bowel densely adherent to the hernia sac.  Serosal repairs down x 2.  Ileostomy revision done for last 7 cm given poor tissues.   Type of ventral wall repair:   Laparoscopic underlay repair .with Primary repair of largest hernia Placement of mesh: Right lateral and flank retrorectus preperitoneal.  Midline intraperitoneal. Name of mesh: Bard Ventralight dual sided (polypropylene / Seprafilm) Size of mesh: 33x27cm Orientation: Transverse Mesh overlap:  7cm    Assessment [facility] Stay = 6 days)      Delayed abdominal fluid collection status post drainage with purulence concern for infected seroma  Plan:  New collection formation rather odd 6 weeks later in old hernia sac & right abd wall.    IR drainage 12/3 concern for purulence -everything collapsed with a one drain fortunately Growing strep and Klebsiella.  Sensitivities pending  Continue piperacillin nadine.  Ruled out MRSA.  May benefit from repeat CT scan to make sure there is no recurrent or other concerning fluid collections.  At the very least drain study in about 2 weeks.  IR is aware but I do not think they have arranged yet.  I think she is close to discharge so hopefully they will do that.  Suspect patient is going to need antibiotics for prolonged period of time since she has fluid collections near her mesh.  Awaiting sensitivities per infectious disease.    Try and hold off on mesh removal at this time.  Pt wants to avoid any more surgery.  Hopefully will resolve but odds are high.  Perhaps could be more isolated & can mane to locally excise the last area.  We will see.    Right upper quadrant 3 x 3 cm subcutaneous nodule most likely part of the old hernia sac closing down or hematoma.  Does not seem like an abscess.  No change in size.  Still sensitive.  Ice and pain control and follow-up.  If getting larger, repeat CAT scan to make sure there is not another undrained collection.  Low  hemoglobin in the setting of iron  deficiency anemia.    Give IV iron  since doubt good PO intake No extra anticoagulation for now Dropped hemoglobin getting transfused. Hgb now  9.8-10.3  Suspect this was more of an infected hematoma.  There is no evidence of any active GI bleeding.  No hematochezia or hematemesis.    Solid diet as tolerated.    Ileostomy pink with no leaking or concerns there.  Wound ostomy nursing team evaluated per my request and agrees.  Doubt perforation or separation there.    Stressed the importance of having strict I's and O's including drain output as well as ileostomy output to make sure she is not getting dehydrated.  Continue usual bowel regimen with with fiber and loperamide .  Do twice daily regimen with low threshold to increase to 3 times daily    -monitor electrolytes & replace as needed.  Corrected hypokalemia.  Keep K>4, Mg>2, Phos>3  Hypertension, GERD, sleep apnea, etc. per primary service often uses oxygen at night for sleep apnea..  -VTE prophylaxis- SCDs.  Anticoagulation prophyllaxis SQ as appropriate  -mobilize as tolerated to help recovery.  Enlist therapies in moderate/high risk patients as appropriate  I updated the patient's status to the patient  Recommendations were made.  Questions were answered.  She expressed understanding & appreciation.  -Disposition: Awaiting sensitivities to determine antibiotic regimen plan .     I reviewed nursing notes, last 24 h vitals and pain scores, last 48 h intake and output, last 24 h labs and trends, and last 24 h imaging results.  I have reviewed this patient's available data, including medical history, events of note, test results, etc as part of my evaluation.   A significant portion of that time was spent in counseling. Care during the described time interval was provided by me.  This care required moderate level of medical decision making.  12/09/2023    Subjective: (Chief complaint)  Feeling better.  Eating better.  Notes persistent soreness in knot abd wall in right upper quadrant.  Not worse.  Objective:  Vital signs:  Vitals:   12/07/23 1958 12/08/23 1354  12/08/23 2002 12/09/23 0601  BP: (!) 140/61 (!) 131/58 (!) 142/65 (!) 127/55  Pulse: 80 75 82 79  Resp: 18 18 18 16   Temp: 98.7 F (37.1 C) 98.8 F (37.1 C) 98.7 F (37.1 C) 98.9 F (37.2 C)  TempSrc: Oral  Oral   SpO2: 98% 100% 98% 95%  Weight:      Height:        Last BM Date : 12/09/23 (Ileostomy)  Intake/Output   Yesterday:  No intake/output data recorded. This shift:  Total I/O In: 480 [P.O.:480] Out: 100 [Stool:100]  Bowel function:  Flatus: YES  BM:  YES  Drain: Left periumbilical drain in place with thin serous with slightly purulent effluent   Physical Exam:  General: Pt awake/alert in no acute distress.  Walking around the room with no assistance.  Smiling.  Not toxic.  Not sickly.  This is the best time seen her .  Calm.   Eyes: PERRL, normal EOM.  Sclera clear.  No icterus Neuro: CN II-XII intact w/o focal sensory/motor deficits. Lymph: No head/neck/groin lymphadenopathy Psych:  No delerium/psychosis/paranoia.  Oriented x 4 HENT: Normocephalic, Mucus membranes moist.  No thrush Neck: Supple, No tracheal deviation.  No obvious  thyromegaly Chest: No pain to chest wall compression.  Good respiratory excursion.  No audible wheezing CV:  Pulses intact.  Regular rhythm.  No major extremity edema MS: Normal AROM mjr joints.  No obvious deformity  Abdomen: Obese.  Sensitive only in right upper quadrant subcostal 3 x 3 cm E drain consistent with a hematoma or not.  Does not appear to be fluctuant like an abscess.  No change in size.  Rest of the abdomen soft.   Swelling gone down.  No peau d'orange or cellulitis.    Ileostomy pink.  No evidence of any separation around the edges or necrosis or dehiscence.  No infection abscess or purulence there.  Flatus and oatmeal thick effluent from ileostomy.  The rest the abdomen is  Soft.  Nondistended.    Ext:   No deformity.  No mjr edema.  No cyanosis Skin: No petechiae / purpurea.  No major sores.  Warm and  dry    Results:   Cultures: Recent Results (from the past 720 hours)  Aerobic/Anaerobic Culture w Gram Stain (surgical/deep wound)     Status: None   Collection Time: 11/09/23 11:31 AM   Specimen: Wound; Abdominal Fluid  Result Value Ref Range Status   Specimen Description   Final    WOUND ASCITIES Performed at [facility], [address] [place], [place] [address]    Special Requests   Final    NONE Performed at [facility], [address], [place], [place] [address]    Gram Stain NO WBC SEEN NO ORGANISMS SEEN   Final   Culture   Final    No growth aerobically or anaerobically. Performed at [facility], [address], [place], [place] [address]    Report Status 11/15/2023 FINAL  Final  Aerobic/Anaerobic Culture w Gram Stain (surgical/deep wound)     Status: None (Preliminary result)   Collection Time: 12/04/23  1:50 PM   Specimen: Abscess  Result Value Ref Range Status   Specimen Description   Final    ABSCESS Performed at [facility], [address], [place], [place] [address]    Special Requests   Final    NONE Performed at [facility], [address], [place], [place] [address]    Gram Stain   Final    ABUNDANT WBC PRESENT, PREDOMINANTLY PMN ABUNDANT GRAM POSITIVE COCCI IN PAIRS IN CHAINS    Culture   Final    CULTURE REINCUBATED FOR BETTER GROWTH Performed at [facility], [address], [place], [place] [address]    Report Status PENDING  Incomplete    Labs: Results for orders placed or performed during the hospital encounter of 12/03/23 (from the past 48 hours)  CBC     Status: Abnormal   Collection Time: 12/07/23 11:21 AM  Result Value Ref Range   WBC 8.7 4.0 - 10.5 K/uL   RBC 3.41 (L) 3.87 - 5.11 MIL/uL   Hemoglobin 10.2 (L) 12.0 - 15.0 g/dL   HCT 70.4 (L) 63.9 - 53.9 %   MCV 86.5 80.0 - 100.0 fL   MCH 29.9 26.0 - 34.0 pg   MCHC 34.6 30.0 - 36.0 g/dL    RDW 83.0 (H) 88.4 - 15.5 %   Platelets 377 150 - 400 K/uL   nRBC 0.0 0.0 - 0.2 %    Comment: Performed at [facility], [address] [place], [place] [address]  Renal function  panel     Status: Abnormal   Collection Time: 12/07/23 11:21 AM  Result Value Ref Range   Sodium 140 135 - 145 mmol/L   Potassium 4.8 3.5 - 5.1 mmol/L   Chloride 107 98 - 111 mmol/L   CO2 25 22 - 32 mmol/L   Glucose, Bld 112 (H) 70 - 99 mg/dL    Comment: Glucose reference range applies only to samples taken after fasting for at least 8 hours.   BUN 10 8 - 23 mg/dL   Creatinine, Ser 9.06 0.44 - 1.00 mg/dL   Calcium  9.2 8.9 - 10.3 mg/dL   Phosphorus 1.7 (L) 2.5 - 4.6 mg/dL   Albumin 3.2 (L) 3.5 - 5.0 g/dL   GFR, Estimated >39 >39 mL/min    Comment: (NOTE) Calculated using the CKD-EPI Creatinine Equation (2021)    Anion gap 9 5 - 15    Comment: Performed at [facility], [address], [place], [place] [address]  Magnesium      Status: None   Collection Time: 12/07/23 11:21 AM  Result Value Ref Range   Magnesium  1.9 1.7 - 2.4 mg/dL    Comment: Performed at [facility], [address] [place], [place] [address]  CBC with Differential/Platelet     Status: Abnormal   Collection Time: 12/08/23  6:06 AM  Result Value Ref Range   WBC 8.9 4.0 - 10.5 K/uL   RBC 3.57 (L) 3.87 - 5.11 MIL/uL   Hemoglobin 10.3 (L) 12.0 - 15.0 g/dL   HCT 68.8 (L) 63.9 - 53.9 %   MCV 87.1 80.0 - 100.0 fL   MCH 28.9 26.0 - 34.0 pg   MCHC 33.1 30.0 - 36.0 g/dL   RDW 82.7 (H) 88.4 - 84.4 %   Platelets 362 150 - 400 K/uL   nRBC 0.0 0.0 - 0.2 %   Neutrophils Relative % 73 %   Neutro Abs 6.5 1.7 - 7.7 K/uL   Lymphocytes Relative 10 %   Lymphs Abs 0.9 0.7 - 4.0 K/uL   Monocytes Relative 11 %   Monocytes Absolute 1.0 0.1 - 1.0 K/uL   Eosinophils Relative 4 %   Eosinophils Absolute 0.4 0.0 - 0.5 K/uL   Basophils Relative 1 %   Basophils Absolute 0.1 0.0 - 0.1 K/uL    Immature Granulocytes 1 %   Abs Immature Granulocytes 0.08 (H) 0.00 - 0.07 K/uL    Comment: Performed at [facility], [address], [place], [place] [address]  Renal function panel     Status: Abnormal   Collection Time: 12/08/23  6:06 AM  Result Value Ref Range   Sodium 142 135 - 145 mmol/L   Potassium 4.6 3.5 - 5.1 mmol/L   Chloride 107 98 - 111 mmol/L   CO2 25 22 - 32 mmol/L   Glucose, Bld 103 (H) 70 - 99 mg/dL    Comment: Glucose reference range applies only to samples taken after fasting for at least 8 hours.   BUN 13 8 - 23 mg/dL   Creatinine, Ser 9.08 0.44 - 1.00 mg/dL   Calcium  9.8 8.9 - 10.3 mg/dL   Phosphorus 2.4 (L) 2.5 - 4.6 mg/dL   Albumin 3.3 (L) 3.5 - 5.0 g/dL   GFR, Estimated >39 >39 mL/min    Comment: (NOTE) Calculated using the CKD-EPI Creatinine Equation (2021)    Anion gap 10 5 - 15    Comment: Performed at [facility], [address]  [place], [place] [address]  Magnesium      Status: None   Collection Time: 12/08/23  6:06 AM  Result Value Ref Range   Magnesium  1.9 1.7 - 2.4 mg/dL    Comment: Performed at [facility], [address], [place], [place] [address]  Renal function panel     Status: Abnormal   Collection Time: 12/09/23  5:31 AM  Result Value Ref Range   Sodium 142 135 - 145 mmol/L   Potassium 4.1 3.5 - 5.1 mmol/L   Chloride 108 98 - 111 mmol/L   CO2 24 22 - 32 mmol/L   Glucose, Bld 103 (H) 70 - 99 mg/dL    Comment: Glucose reference range applies only to samples taken after fasting for at least 8 hours.   BUN 13 8 - 23 mg/dL   Creatinine, Ser 9.06 0.44 - 1.00 mg/dL   Calcium  9.3 8.9 - 10.3 mg/dL   Phosphorus 2.0 (L) 2.5 - 4.6 mg/dL   Albumin 3.2 (L) 3.5 - 5.0 g/dL   GFR, Estimated >39 >39 mL/min    Comment: (NOTE) Calculated using the CKD-EPI Creatinine Equation (2021)    Anion gap 10 5 - 15    Comment: Performed at [facility], [address]  [place], [place] [address]  CBC with Differential/Platelet     Status: Abnormal   Collection Time: 12/09/23  5:31 AM  Result Value Ref Range   WBC 7.2 4.0 - 10.5 K/uL   RBC 3.36 (L) 3.87 - 5.11 MIL/uL   Hemoglobin 9.8 (L) 12.0 - 15.0 g/dL   HCT 69.8 (L) 63.9 - 53.9 %   MCV 89.6 80.0 - 100.0 fL   MCH 29.2 26.0 - 34.0 pg   MCHC 32.6 30.0 - 36.0 g/dL   RDW 82.1 (H) 88.4 - 84.4 %   Platelets 348 150 - 400 K/uL   nRBC 0.0 0.0 - 0.2 %   Neutrophils Relative % 67 %   Neutro Abs 4.8 1.7 - 7.7 K/uL   Lymphocytes Relative 14 %   Lymphs Abs 1.0 0.7 - 4.0 K/uL   Monocytes Relative 12 %   Monocytes Absolute 0.8 0.1 - 1.0 K/uL   Eosinophils Relative 5 %   Eosinophils Absolute 0.4 0.0 - 0.5 K/uL   Basophils Relative 1 %   Basophils Absolute 0.1 0.0 - 0.1 K/uL   Immature Granulocytes 1 %   Abs Immature Granulocytes 0.08 (H) 0.00 - 0.07 K/uL    Comment: Performed at [facility], [address], [place], [place] [address]  Magnesium      Status: None   Collection Time: 12/09/23  5:31 AM  Result Value Ref Range   Magnesium  1.8 1.7 - 2.4 mg/dL    Comment: Performed at [facility], [address] [place], [place] [address]     Imaging / Studies: No results found.    Medications / Allergies: per chart  Antibiotics: Anti-infectives (From admission, onward)    Start     Dose/Rate Route Frequency Ordered Stop   12/03/23 2300  piperacillin -tazobactam (ZOSYN ) IVPB 3.375 g        3.375 g 12.5 mL/hr over 240 Minutes Intravenous Every 8 hours 12/03/23 2247           Note: Portions of this report may have been transcribed using voice recognition software. Every effort was made to ensure accuracy; however, inadvertent computerized transcription errors may be present.   Any transcriptional errors that result from this process  are unintentional.    Elspeth KYM Schultze, MD, FACS, MASCRS Esophageal, Gastrointestinal & Colorectal Surgery Robotic and Minimally  Invasive Surgery  [facility] [address] [place], [place] [address] [phone] Fax [phone] Main  CONTACT INFORMATION: Weekday (9AM-5PM): Call [facility] main office at [phone] Weeknight (5PM-9AM) or Weekend/Holiday: Check EPIC Web Links tab & use AMION (password  [email or web address]) for General Surgery [facility] coverage  Please, DO NOT use SecureChat  (it is not reliable communication to reach operating surgeons & will lead to a delay in care).   Epic staff messaging available for outpatient concerns needing 1-2 business day response.      12/09/2023  8:48 AM

## 2023-12-09 NOTE — Progress Notes (Signed)
 PROGRESS NOTE    Donna [name]  FMW:[id number] DOB: 07-07-1945 DOA: 12/03/2023 PCP: Clarice Nottingham, MD    Chief Complaint  Patient presents with   Post-op Problem    Brief Narrative:  Donna [name] is a 78 y.o. female with medical history significant for ulcerative colitis, colon cancer s/p colectomy with ileostomy, CKD-3A, GERD, DDD, OSA, incarcerated abdominal wall hernia s/p surgery/repair on 10/18/23 who presented to the ED for evaluation of pain and drainage around her abdominal incision site and admitted for intra-abdominal infection.  Patient placed empirically on IV antibiotics.  General surgery consulted.  IR also consulted for drain placement per general surgery.     Assessment & Plan:   Principal Problem:   Intra-abdominal infection Active Problems:   Hypertension   Status post ileostomy (HCC)   History of hernia repair   CKD stage 3a, GFR 45-59 ml/min (HCC)   Generalized weakness   Anemia  #1 intra-abdominal infection/history of incarcerated abdominal wall hernia s/p repair -Patient noted with multiple hospitalizations over the past month for complications related to recent hernia repair. - Patient presented with diffuse abdominal pain x 3 to 4 days prior to admission with increased drainage and pain around surgical site. - CT abdomen and pelvis with multiple loculated fluid collections surrounding the ileostomy with increasing punctuate gas, suggestive of superinfection and/or enteric communication and relative hyperdensity suggestive of hemorrhagic fluid collections that have enlarged since prior examination. - Patient seen in consultation by general surgery who has asked IR to place drains in collections for now and cultures to be obtained. - It is noted per general surgery the last fluid collection aspiration of the abdominal fluid collection 11/8 was consistent with seroma and cultures negative. -Patient seen by IR and patient status post 12 French  drain placement and left mid abdominal region with evacuation of 250 cc of purulent fluid, 12/04/2023. - Cultures from abscess with few Klebsiella pneumonia, Streptococcus intermedius, eikenella corrodens.  - Continue empiric IV Zosyn . -Patient seen in consultation by ID who are recommending continuation of IV Zosyn  for now, follow-up abscess cultures and feel patient would likely need protracted antibiotics with close follow-up with ID, general surgery and radiology. - Per general surgery.    #2  CKD 3A -Creatinine noted at 1.32 which is not significantly different from recent baseline but slightly worse than 0.99 3 weeks ago. - Renal function improved with hydration and creatinine currently at 0.93. - Follow.  3.  Hypertension  - Continue Norvasc  10 mg daily, labetalol  100 mg twice daily, Lotensin  40 mg daily.   4.  Colon cancer status post colectomy with ileostomy -Patient noted with good output from ileostomy bag. -Ileostomy bag noted to have burst x 2 per patient. - Continue FiberCon, loperamide . - Consult with wound care RN.  5.  GERD - Continue Pepcid , Protonix .    6.  Chronic leg swelling -Patient noted on admission with 1+ bilateral lower extremity edema on exam unchanged. - TED hose.  7.  Generalized weakness -In the setting of acute illness. - PT/OT following and recommending home health.  8.  Anemia - Likely anemia of chronic disease and possible hematoma noted on CT abdomen and pelvis.. - Patient with no overt bleeding. - Anemia panel from 11/08/2023 with iron  level of 15, ferritin of 467, folate of > 20.  Vitamin B12 1740. -Hemoglobin noted at 6.7 on 12/05/2023.  - Status post transfusion 2 months PRBCs with hemoglobin currently at 9.8 this morning.  - Follow H&H. -  Transfusion threshold hemoglobin < 7.  9.  Hypophosphatemia/hypomagnesemia/hypokalemia - Magnesium  1.8.  - Phosphorus at 2.0. - Potassium of 4.1.   - K-Phos 30 mmol IV x 1.   - Resume oral K-Phos  250 mg p.o. twice daily tommorrow x 3 days.    - Repeat labs in AM.     DVT prophylaxis: SCDs.  Code Status: Full Family Communication: Updated patient.  No family at bedside.  Disposition: TBD  Status is: Inpatient Remains inpatient appropriate because: Severity of illness   Consultants:  General Surgery: Dr. Sheldon 12/04/2023 Interventional radiology: Dr. Karalee 12/04/2023 ID: Dr.Van Dam 12/06/2023  Procedures:  CT abdomen and pelvis 12/03/2023 Transfuse 2 units PRBCs. Placement of 12 French drain via left mid abdominal approach with evacuation of 250 cc of purulent fluid per IR: Dr. Karalee 12/04/2023.  Antimicrobials:  Anti-infectives (From admission, onward)    Start     Dose/Rate Route Frequency Ordered Stop   12/03/23 2300  piperacillin -tazobactam (ZOSYN ) IVPB 3.375 g        3.375 g 12.5 mL/hr over 240 Minutes Intravenous Every 8 hours 12/03/23 2247           Subjective: Patient sitting up in recliner.  States ileostomy bag has burst twice.  Denies any chest pain or shortness of breath.  Still with abdominal pain but slowly improving on current pain regimen.    Objective: Vitals:   12/07/23 1958 12/08/23 1354 12/08/23 2002 12/09/23 0601  BP: (!) 140/61 (!) 131/58 (!) 142/65 (!) 127/55  Pulse: 80 75 82 79  Resp: 18 18 18 16   Temp: 98.7 F (37.1 C) 98.8 F (37.1 C) 98.7 F (37.1 C) 98.9 F (37.2 C)  TempSrc: Oral  Oral   SpO2: 98% 100% 98% 95%  Weight:      Height:        Intake/Output Summary (Last 24 hours) at 12/09/2023 1231 Last data filed at 12/09/2023 0829 Gross per 24 hour  Intake 480 ml  Output 100 ml  Net 380 ml   Filed Weights   12/03/23 1640  Weight: 75.3 kg    Examination:  General exam: NAD. Respiratory system: Lungs clear to auscultation bilaterally.  No wheezes, no crackles, no rhonchi.  Fair air movement.  Speaking full sentences.  No use of accessory muscles of respiration.  Cardiovascular system: RRR no murmurs rubs or  gallops.  No JVD.  No lower extremity edema.  Gastrointestinal system: Abdomen is soft, nondistended, slightly distended, some tenderness to the palpation in the right abdominal region. Decreased tenderness to palpation in the left side.  JP drain in place with serosanguineous drainage noted.  Ileostomy bag with stool noted and bag noted to have ruptured. Central nervous system: Alert and oriented.  Moving extremities spontaneously.  No focal neurological deficits. Extremities: Symmetric 5 x 5 power. Skin: No rashes, lesions or ulcers Psychiatry: Judgement and insight appear normal. Mood & affect appropriate.     Data Reviewed: I have personally reviewed following labs and imaging studies  CBC: Recent Labs  Lab 12/05/23 0032 12/06/23 0321 12/07/23 1121 12/08/23 0606 12/09/23 0531  WBC 7.9 8.8 8.7 8.9 7.2  NEUTROABS 5.4 6.1  --  6.5 4.8  HGB 6.7* 9.4* 10.2* 10.3* 9.8*  HCT 19.6* 26.9* 29.5* 31.1* 30.1*  MCV 84.8 84.3 86.5 87.1 89.6  PLT 426* 352 377 362 348    Basic Metabolic Panel: Recent Labs  Lab 12/05/23 0032 12/06/23 0321 12/07/23 1121 12/08/23 0606 12/09/23 0531  NA 142 143 140  142 142  K 3.3* 4.3 4.8 4.6 4.1  CL 107 108 107 107 108  CO2 25 25 25 25 24   GLUCOSE 94 93 112* 103* 103*  BUN 15 11 10 13 13   CREATININE 1.04* 0.91 0.93 0.91 0.93  CALCIUM  8.6* 8.6* 9.2 9.8 9.3  MG 1.6* 2.1 1.9 1.9 1.8  PHOS 1.8* 2.8 1.7* 2.4* 2.0*    GFR: Estimated Creatinine Clearance: 50 mL/min (by C-G formula based on SCr of 0.93 mg/dL).  Liver Function Tests: Recent Labs  Lab 12/03/23 1646 12/07/23 1121 12/08/23 0606 12/09/23 0531  AST 29  --   --   --   ALT 23  --   --   --   ALKPHOS 133*  --   --   --   BILITOT 0.4  --   --   --   PROT 6.8  --   --   --   ALBUMIN 3.2* 3.2* 3.3* 3.2*    CBG: No results for input(s): GLUCAP in the last 168 hours.   Recent Results (from the past 240 hours)  Aerobic/Anaerobic Culture w Gram Stain (surgical/deep wound)     Status:  None (Preliminary result)   Collection Time: 12/04/23  1:50 PM   Specimen: Abscess  Result Value Ref Range Status   Specimen Description   Final    ABSCESS Performed at [facility], [address], [place], [place] [address]    Special Requests   Final    NONE Performed at [facility], [address] [place], [place] [address]    Gram Stain   Final    ABUNDANT WBC PRESENT, PREDOMINANTLY PMN ABUNDANT GRAM POSITIVE COCCI IN PAIRS IN CHAINS    Culture   Final    FEW KLEBSIELLA PNEUMONIAE CULTURE REINCUBATED FOR BETTER GROWTH Performed at [facility], [address] [place], [place] [address]    Report Status PENDING  Incomplete   Organism ID, Bacteria KLEBSIELLA PNEUMONIAE  Final      Susceptibility   Klebsiella pneumoniae - MIC*    AMPICILLIN >=32 RESISTANT Resistant     CEFAZOLIN  (NON-URINE) 2 SENSITIVE Sensitive     CEFEPIME  <=0.12 SENSITIVE Sensitive     ERTAPENEM <=0.12 SENSITIVE Sensitive     CEFTRIAXONE <=0.25 SENSITIVE Sensitive     CIPROFLOXACIN  <=0.06 SENSITIVE Sensitive     GENTAMICIN <=1 SENSITIVE Sensitive     MEROPENEM <=0.25 SENSITIVE Sensitive     TRIMETH/SULFA <=20 SENSITIVE Sensitive     AMPICILLIN/SULBACTAM 8 SENSITIVE Sensitive     PIP/TAZO Value in next row Sensitive      <=4 SENSITIVEThis is a modified FDA-approved test that has been validated and its performance characteristics determined by the reporting laboratory.  This laboratory is certified under the Clinical Laboratory Improvement Amendments CLIA as qualified to perform high complexity clinical laboratory testing.    * FEW KLEBSIELLA PNEUMONIAE         Radiology Studies: No results found.       Scheduled Meds:  sodium chloride    Intravenous Once   acetaminophen   500 mg Oral TID WC & HS   amLODipine   10 mg Oral QHS   aspirin  EC  81 mg Oral Daily   benazepril   40 mg Oral QHS   calcium  carbonate  2 tablet Oral BID WC   famotidine   20 mg  Oral Daily   feeding supplement  237 mL Oral BID BM   labetalol   100 mg Oral BID  latanoprost   1 drop Both Eyes QHS   lidocaine   15 mL Oral Once   loperamide   2 mg Oral TID   multivitamins with iron   1 tablet Oral Daily   pantoprazole   40 mg Oral BID AC   [START ON 12/10/2023] phosphorus  250 mg Oral BID   polycarbophil  625 mg Oral BID   sodium chloride  flush  10-40 mL Intracatheter Q12H   sodium chloride  flush  5 mL Intracatheter Q8H   timolol   1 drop Both Eyes Daily   Continuous Infusions:  piperacillin -tazobactam (ZOSYN )  IV 3.375 g (12/09/23 0538)   potassium PHOSPHATE  IVPB (in mmol) 30 mmol (12/09/23 1030)     LOS: 6 days    Time spent: 35 minutes    Toribio Hummer, MD [facility]   To contact the attending provider between 7A-7P or the covering provider during after hours 7P-7A, please log into the web site [email or web address] and access using universal [place] password for that web site. If you do not have the password, please call the hospital operator.  12/09/2023, 12:31 PM

## 2023-12-09 NOTE — Progress Notes (Signed)
 Patient called and reported that she has been admitted to the hospital for the past couple of weeks. She stated that she may have missed both doses of Repatha  for the previous month. The patient anticipates remaining in the hospital for an additional 2-3 days. She was advised that she may administer her next dose as soon as she is discharged.  The next follow-up call has been rescheduled for three weeks from today. The patient was also advised that if she is due for her next injection soon and does not have a dose on hand, she should contact our pharmacy to arrange her next refill.

## 2023-12-09 NOTE — Progress Notes (Signed)
 Mobility Specialist - Progress Note   12/09/23 1100  Mobility  Activity Ambulated with assistance  Level of Assistance Contact guard assist, steadying assist  Assistive Device Front wheel walker  Distance Ambulated (ft) 350 ft  Range of Motion/Exercises Active  Activity Response Tolerated well  Mobility Referral Yes  Mobility visit 1 Mobility  Mobility Specialist Start Time (ACUTE ONLY) 1122  Mobility Specialist Stop Time (ACUTE ONLY) 1133  Mobility Specialist Time Calculation (min) (ACUTE ONLY) 11 min   Received in bed and agreed to mobility. Had no issues throughout session, and returned to bed with all needs met.   Cyndee Ada Mobility Specialist

## 2023-12-09 NOTE — Progress Notes (Incomplete Revision)
 12/09/2023  Donna [name] [id number] 26-Jan-1945  CARE TEAM: PCP: Clarice Nottingham, MD  Outpatient Care Team: Patient Care Team: Clarice Nottingham, MD as PCP - General (Internal Medicine) Elmira Newman PARAS, MD as PCP - Cardiology (Cardiology) Teresa Lonni HERO, MD as Consulting Physician (Colon and Rectal Surgery) Sheldon Standing, MD as Consulting Physician (General Surgery) Donnald Charleston, MD as Consulting Physician (Gastroenterology) Elmira Newman PARAS, MD as Consulting Physician (Cardiology)  Inpatient Treatment Team: Treatment Team:  Sebastian Toribio GAILS, MD Bobbette File, MD Sheldon Standing, MD Ruthellen Ruthellen Radiology, MD Grady Chum, LPN Claudene Shela MATSU, NT [place], [place], [facility] Estelle Hunter DEL, RN Doneta Glenys DASEN, RN Georgina Meth D   Problem List:   Principal Problem:   Intra-abdominal infection Active Problems:   Hypertension   Status post ileostomy [facility])   History of hernia repair   CKD stage 3a, GFR 45-59 ml/min (HCC)   Generalized weakness   Anemia   10/18/2023  POST-OPERATIVE DIAGNOSIS:  PARASTOMAL HERNIA AND VENTRAL INCISIONAL RECURRENT AND INCARCERATED ABDOMINAL WALL HERNIAE  Dimensions of hernia post-op:  16cm x 8cm   PROCEDURE:   ROBOTIC COMPONENT SEPARATION (transversus abdominis release = TAR) on right Repair of incarcerated recurrent incisional and parastomal hernias with mesh ROBOTIC LYSIS OF ADHESIONS SEROSAL REPAIR x2 END ILEOSTOMY REVISION TAP BLOCK - BILATERAL   SURGEON:  Standing KYM Sheldon, MD  OR FINDINGS:  Moderately dense adhesions small bowel to anterior abdominal wall and periumbilical midline hernia.  Swiss cheese hernias extending up to the subxiphoid region.  15 cm in length.  Moderate parastomal hernia around end ileostomy with a foot of small bowel densely adherent to the hernia sac.  Serosal repairs down x 2.  Ileostomy revision done for last 7 cm given poor tissues.   Type of ventral wall repair:   Laparoscopic underlay repair .with Primary repair of largest hernia Placement of mesh: Right lateral and flank retrorectus preperitoneal.  Midline intraperitoneal. Name of mesh: Bard Ventralight dual sided (polypropylene / Seprafilm) Size of mesh: 33x27cm Orientation: Transverse Mesh overlap:  7cm    Assessment [facility] Stay = 6 days)      Delayed abdominal fluid collection status post drainage with purulence concern for infected seroma  Plan:  New collection formation rather odd 6 weeks later in old hernia sac & right abd wall.    IR drainage 12/3 concern for purulence -everything collapsed with a one drain fortunately Growing strep and Klebsiella.  Sensitivities pending  Continue piperacillin nadine.  Ruled out MRSA.  May benefit from repeat CT scan to make sure there is no recurrent or other concerning fluid collections.  At the very least drain study in about 2 weeks.  IR is aware but I do not think they have arranged yet.  I think she is close to discharge so hopefully they will do that.  Suspect patient is going to need antibiotics for prolonged period of time since she has fluid collections near her mesh.  Awaiting sensitivities per infectious disease.    Try and hold off on mesh removal at this time.  Pt wants to avoid any more surgery.  Hopefully will resolve but odds are high.  Perhaps could be more isolated & can locally excise the last area.  We will see.    Right upper quadrant 3 x 3 cm subcutaneous nodule most likely part of the old hernia sac closing down or hematoma.  Does not seem like an abscess.  No change in size.  Still sensitive.  Ice and  pain control and follow-up.  If getting larger, repeat CAT scan to make sure there is not another undrained collection.  Low  hemoglobin in the setting of iron  deficiency anemia.    Give IV iron  since doubt good PO intake No extra anticoagulation for now Dropped hemoglobin getting transfused. Hgb now  9.8-10.3  Suspect this was more of an infected hematoma.  There is no evidence of any active GI bleeding.  No hematochezia or hematemesis.    Solid diet as tolerated.    Ileostomy pink with no leaking or concerns there.  Wound ostomy nursing team evaluated per my request and agrees.  Doubt perforation or separation there.    Stressed the importance of having strict I's and O's including drain output as well as ileostomy output to make sure she is not getting dehydrated.  Continue usual bowel regimen with with fiber and loperamide .  Do twice daily regimen with low threshold to increase to 3 times daily    -monitor electrolytes & replace as needed.  Corrected hypokalemia.  Keep K>4, Mg>2, Phos>3  Hypertension, GERD, sleep apnea, etc. per primary service often uses oxygen at night for sleep apnea..  -VTE prophylaxis- SCDs.  Anticoagulation prophyllaxis SQ as appropriate  -mobilize as tolerated to help recovery.  Enlist therapies in moderate/high risk patients as appropriate  I updated the patient's status to the patient  Recommendations were made.  Questions were answered.  She expressed understanding & appreciation.  -Disposition: Awaiting sensitivities to determine antibiotic regimen plan .     I reviewed nursing notes, last 24 h vitals and pain scores, last 48 h intake and output, last 24 h labs and trends, and last 24 h imaging results.  I have reviewed this patient's available data, including medical history, events of note, test results, etc as part of my evaluation.   A significant portion of that time was spent in counseling. Care during the described time interval was provided by me.  This care required moderate level of medical decision making.  12/09/2023    Subjective: (Chief complaint)  Feeling better.  Eating better.  Notes persistent soreness in knot abd wall in right upper quadrant.  Not worse.  Objective:  Vital signs:  Vitals:   12/07/23 1958 12/08/23 1354  12/08/23 2002 12/09/23 0601  BP: (!) 140/61 (!) 131/58 (!) 142/65 (!) 127/55  Pulse: 80 75 82 79  Resp: 18 18 18 16   Temp: 98.7 F (37.1 C) 98.8 F (37.1 C) 98.7 F (37.1 C) 98.9 F (37.2 C)  TempSrc: Oral  Oral   SpO2: 98% 100% 98% 95%  Weight:      Height:        Last BM Date : 12/09/23 (Ileostomy)  Intake/Output   Yesterday:  No intake/output data recorded. This shift:  Total I/O In: 480 [P.O.:480] Out: 100 [Stool:100]  Bowel function:  Flatus: YES  BM:  YES  Drain: Left periumbilical drain in place with thin serous with slightly purulent effluent   Physical Exam:  General: Pt awake/alert in no acute distress.  Walking around the room with no assistance.  Smiling.  Not toxic.  Not sickly.  This is the best time seen her .  Calm.   Eyes: PERRL, normal EOM.  Sclera clear.  No icterus Neuro: CN II-XII intact w/o focal sensory/motor deficits. Lymph: No head/neck/groin lymphadenopathy Psych:  No delerium/psychosis/paranoia.  Oriented x 4 HENT: Normocephalic, Mucus membranes moist.  No thrush Neck: Supple, No tracheal deviation.  No obvious thyromegaly Chest:  No pain to chest wall compression.  Good respiratory excursion.  No audible wheezing CV:  Pulses intact.  Regular rhythm.  No major extremity edema MS: Normal AROM mjr joints.  No obvious deformity  Abdomen: Obese.  Sensitive only in right upper quadrant subcostal 3 x 3 cm E drain consistent with a hematoma or not.  Does not appear to be fluctuant like an abscess.  No change in size.  Rest of the abdomen soft.   Swelling gone down.  No peau d'orange or cellulitis.    Ileostomy pink.  No evidence of any separation around the edges or necrosis or dehiscence.  No infection abscess or purulence there.  Flatus and oatmeal thick effluent from ileostomy.  The rest the abdomen is  Soft.  Nondistended.    Ext:   No deformity.  No mjr edema.  No cyanosis Skin: No petechiae / purpurea.  No major sores.  Warm and  dry    Results:   Cultures: Recent Results (from the past 720 hours)  Aerobic/Anaerobic Culture w Gram Stain (surgical/deep wound)     Status: None   Collection Time: 11/09/23 11:31 AM   Specimen: Wound; Abdominal Fluid  Result Value Ref Range Status   Specimen Description   Final    WOUND ASCITIES Performed at [facility], [address] [place], [place] [address]    Special Requests   Final    NONE Performed at [facility], [address], [place], [place] [address]    Gram Stain NO WBC SEEN NO ORGANISMS SEEN   Final   Culture   Final    No growth aerobically or anaerobically. Performed at [facility], [address], [place], [place] [address]    Report Status 11/15/2023 FINAL  Final  Aerobic/Anaerobic Culture w Gram Stain (surgical/deep wound)     Status: None (Preliminary result)   Collection Time: 12/04/23  1:50 PM   Specimen: Abscess  Result Value Ref Range Status   Specimen Description   Final    ABSCESS Performed at [facility], [address], [place], [place] [address]    Special Requests   Final    NONE Performed at [facility], [address] [place], [place] [address]    Gram Stain   Final    ABUNDANT WBC PRESENT, PREDOMINANTLY PMN ABUNDANT GRAM POSITIVE COCCI IN PAIRS IN CHAINS    Culture   Final    CULTURE REINCUBATED FOR BETTER GROWTH Performed at [facility], [address], [place], [place] [address]    Report Status PENDING  Incomplete    Labs: Results for orders placed or performed during the hospital encounter of 12/03/23 (from the past 48 hours)  CBC     Status: Abnormal   Collection Time: 12/07/23 11:21 AM  Result Value Ref Range   WBC 8.7 4.0 - 10.5 K/uL   RBC 3.41 (L) 3.87 - 5.11 MIL/uL   Hemoglobin 10.2 (L) 12.0 - 15.0 g/dL   HCT 70.4 (L) 63.9 - 53.9 %   MCV 86.5 80.0 - 100.0 fL   MCH 29.9 26.0 - 34.0 pg   MCHC 34.6 30.0 - 36.0 g/dL    RDW 83.0 (H) 88.4 - 15.5 %   Platelets 377 150 - 400 K/uL   nRBC 0.0 0.0 - 0.2 %    Comment: Performed at [facility], [address], [place], [place] [address]  Renal function panel  Status: Abnormal   Collection Time: 12/07/23 11:21 AM  Result Value Ref Range   Sodium 140 135 - 145 mmol/L   Potassium 4.8 3.5 - 5.1 mmol/L   Chloride 107 98 - 111 mmol/L   CO2 25 22 - 32 mmol/L   Glucose, Bld 112 (H) 70 - 99 mg/dL    Comment: Glucose reference range applies only to samples taken after fasting for at least 8 hours.   BUN 10 8 - 23 mg/dL   Creatinine, Ser 9.06 0.44 - 1.00 mg/dL   Calcium  9.2 8.9 - 10.3 mg/dL   Phosphorus 1.7 (L) 2.5 - 4.6 mg/dL   Albumin 3.2 (L) 3.5 - 5.0 g/dL   GFR, Estimated >39 >39 mL/min    Comment: (NOTE) Calculated using the CKD-EPI Creatinine Equation (2021)    Anion gap 9 5 - 15    Comment: Performed at [facility], [address] [place], [place] [address]  Magnesium      Status: None   Collection Time: 12/07/23 11:21 AM  Result Value Ref Range   Magnesium  1.9 1.7 - 2.4 mg/dL    Comment: Performed at [facility], [address], [place], [place] [address]  CBC with Differential/Platelet     Status: Abnormal   Collection Time: 12/08/23  6:06 AM  Result Value Ref Range   WBC 8.9 4.0 - 10.5 K/uL   RBC 3.57 (L) 3.87 - 5.11 MIL/uL   Hemoglobin 10.3 (L) 12.0 - 15.0 g/dL   HCT 68.8 (L) 63.9 - 53.9 %   MCV 87.1 80.0 - 100.0 fL   MCH 28.9 26.0 - 34.0 pg   MCHC 33.1 30.0 - 36.0 g/dL   RDW 82.7 (H) 88.4 - 84.4 %   Platelets 362 150 - 400 K/uL   nRBC 0.0 0.0 - 0.2 %   Neutrophils Relative % 73 %   Neutro Abs 6.5 1.7 - 7.7 K/uL   Lymphocytes Relative 10 %   Lymphs Abs 0.9 0.7 - 4.0 K/uL   Monocytes Relative 11 %   Monocytes Absolute 1.0 0.1 - 1.0 K/uL   Eosinophils Relative 4 %   Eosinophils Absolute 0.4 0.0 - 0.5 K/uL   Basophils Relative 1 %   Basophils Absolute 0.1 0.0 - 0.1 K/uL    Immature Granulocytes 1 %   Abs Immature Granulocytes 0.08 (H) 0.00 - 0.07 K/uL    Comment: Performed at [facility], [address] [place], [place] [address]  Renal function panel     Status: Abnormal   Collection Time: 12/08/23  6:06 AM  Result Value Ref Range   Sodium 142 135 - 145 mmol/L   Potassium 4.6 3.5 - 5.1 mmol/L   Chloride 107 98 - 111 mmol/L   CO2 25 22 - 32 mmol/L   Glucose, Bld 103 (H) 70 - 99 mg/dL    Comment: Glucose reference range applies only to samples taken after fasting for at least 8 hours.   BUN 13 8 - 23 mg/dL   Creatinine, Ser 9.08 0.44 - 1.00 mg/dL   Calcium  9.8 8.9 - 10.3 mg/dL   Phosphorus 2.4 (L) 2.5 - 4.6 mg/dL   Albumin 3.3 (L) 3.5 - 5.0 g/dL   GFR, Estimated >39 >39 mL/min    Comment: (NOTE) Calculated using the CKD-EPI Creatinine Equation (2021)    Anion gap 10 5 - 15    Comment: Performed at [facility], [address] [place], [place] [address]  Magnesium   Status: None   Collection Time: 12/08/23  6:06 AM  Result Value Ref Range   Magnesium  1.9 1.7 - 2.4 mg/dL    Comment: Performed at [facility], [address] [place], [place] [address]  Renal function panel     Status: Abnormal   Collection Time: 12/09/23  5:31 AM  Result Value Ref Range   Sodium 142 135 - 145 mmol/L   Potassium 4.1 3.5 - 5.1 mmol/L   Chloride 108 98 - 111 mmol/L   CO2 24 22 - 32 mmol/L   Glucose, Bld 103 (H) 70 - 99 mg/dL    Comment: Glucose reference range applies only to samples taken after fasting for at least 8 hours.   BUN 13 8 - 23 mg/dL   Creatinine, Ser 9.06 0.44 - 1.00 mg/dL   Calcium  9.3 8.9 - 10.3 mg/dL   Phosphorus 2.0 (L) 2.5 - 4.6 mg/dL   Albumin 3.2 (L) 3.5 - 5.0 g/dL   GFR, Estimated >39 >39 mL/min    Comment: (NOTE) Calculated using the CKD-EPI Creatinine Equation (2021)    Anion gap 10 5 - 15    Comment: Performed at [facility], [address]  [place], [place] [address]  CBC with Differential/Platelet     Status: Abnormal   Collection Time: 12/09/23  5:31 AM  Result Value Ref Range   WBC 7.2 4.0 - 10.5 K/uL   RBC 3.36 (L) 3.87 - 5.11 MIL/uL   Hemoglobin 9.8 (L) 12.0 - 15.0 g/dL   HCT 69.8 (L) 63.9 - 53.9 %   MCV 89.6 80.0 - 100.0 fL   MCH 29.2 26.0 - 34.0 pg   MCHC 32.6 30.0 - 36.0 g/dL   RDW 82.1 (H) 88.4 - 84.4 %   Platelets 348 150 - 400 K/uL   nRBC 0.0 0.0 - 0.2 %   Neutrophils Relative % 67 %   Neutro Abs 4.8 1.7 - 7.7 K/uL   Lymphocytes Relative 14 %   Lymphs Abs 1.0 0.7 - 4.0 K/uL   Monocytes Relative 12 %   Monocytes Absolute 0.8 0.1 - 1.0 K/uL   Eosinophils Relative 5 %   Eosinophils Absolute 0.4 0.0 - 0.5 K/uL   Basophils Relative 1 %   Basophils Absolute 0.1 0.0 - 0.1 K/uL   Immature Granulocytes 1 %   Abs Immature Granulocytes 0.08 (H) 0.00 - 0.07 K/uL    Comment: Performed at [facility], [address], [place], [place] [address]  Magnesium      Status: None   Collection Time: 12/09/23  5:31 AM  Result Value Ref Range   Magnesium  1.8 1.7 - 2.4 mg/dL    Comment: Performed at [facility], [address], [place], [place] [address]     Imaging / Studies: No results found.    Medications / Allergies: per chart  Antibiotics: Anti-infectives (From admission, onward)    Start     Dose/Rate Route Frequency Ordered Stop   12/03/23 2300  piperacillin -tazobactam (ZOSYN ) IVPB 3.375 g        3.375 g 12.5 mL/hr over 240 Minutes Intravenous Every 8 hours 12/03/23 2247           Note: Portions of this report may have been transcribed using voice recognition software. Every effort was made to ensure accuracy; however, inadvertent computerized transcription errors may be present.   Any transcriptional errors that result from this process are unintentional.    Elspeth KYM Schultze, MD,  FACS, MASCRS Esophageal, Gastrointestinal & Colorectal Surgery Robotic and Minimally  Invasive Surgery  [facility] [address], [address] [place], [place] [address] [phone] Fax [phone] Main  CONTACT INFORMATION: Weekday (9AM-5PM): Call [facility] main office at [phone] Weeknight (5PM-9AM) or Weekend/Holiday: Check EPIC Web Links tab & use AMION (password  [email or web address]) for General Surgery [facility] coverage  Please, DO NOT use SecureChat  (it is not reliable communication to reach operating surgeons & will lead to a delay in care).   Epic staff messaging available for outpatient concerns needing 1-2 business day response.      12/09/2023  8:48 AM

## 2023-12-10 DIAGNOSIS — T8579XA Infection and inflammatory reaction due to other internal prosthetic devices, implants and grafts, initial encounter: Secondary | ICD-10-CM

## 2023-12-10 LAB — CBC WITH DIFFERENTIAL/PLATELET
Abs Immature Granulocytes: 0.05 K/uL (ref 0.00–0.07)
Basophils Absolute: 0.1 K/uL (ref 0.0–0.1)
Basophils Relative: 2 %
Eosinophils Absolute: 0.4 K/uL (ref 0.0–0.5)
Eosinophils Relative: 6 %
HCT: 32.2 % — ABNORMAL LOW (ref 36.0–46.0)
Hemoglobin: 10.7 g/dL — ABNORMAL LOW (ref 12.0–15.0)
Immature Granulocytes: 1 %
Lymphocytes Relative: 13 %
Lymphs Abs: 0.9 K/uL (ref 0.7–4.0)
MCH: 29.2 pg (ref 26.0–34.0)
MCHC: 33.2 g/dL (ref 30.0–36.0)
MCV: 87.7 fL (ref 80.0–100.0)
Monocytes Absolute: 0.7 K/uL (ref 0.1–1.0)
Monocytes Relative: 10 %
Neutro Abs: 4.6 K/uL (ref 1.7–7.7)
Neutrophils Relative %: 68 %
Platelets: 372 K/uL (ref 150–400)
RBC: 3.67 MIL/uL — ABNORMAL LOW (ref 3.87–5.11)
RDW: 17.6 % — ABNORMAL HIGH (ref 11.5–15.5)
WBC: 6.7 K/uL (ref 4.0–10.5)
nRBC: 0 % (ref 0.0–0.2)

## 2023-12-10 LAB — RENAL FUNCTION PANEL
Albumin: 3.4 g/dL — ABNORMAL LOW (ref 3.5–5.0)
Anion gap: 13 (ref 5–15)
BUN: 15 mg/dL (ref 8–23)
CO2: 21 mmol/L — ABNORMAL LOW (ref 22–32)
Calcium: 9.7 mg/dL (ref 8.9–10.3)
Chloride: 108 mmol/L (ref 98–111)
Creatinine, Ser: 0.93 mg/dL (ref 0.44–1.00)
GFR, Estimated: >60 mL/min (ref >60)
Glucose, Bld: 116 mg/dL — ABNORMAL HIGH (ref 70–99)
Phosphorus: 2.5 mg/dL (ref 2.5–4.6)
Potassium: 4 mmol/L (ref 3.5–5.1)
Sodium: 141 mmol/L (ref 135–145)

## 2023-12-10 MED ORDER — ENSURE MAX PROTEIN PO LIQD
11.0000 [oz_av] | Freq: Two times a day (BID) | ORAL | Status: DC
  Administered 2023-12-10: 11 [oz_av] via ORAL
  Filled 2023-12-10 (×2): qty 330

## 2023-12-10 MED ORDER — K PHOS MONO-SOD PHOS DI & MONO 155-852-130 MG PO TABS
250.0000 mg | ORAL_TABLET | Freq: Two times a day (BID) | ORAL | Status: DC
  Administered 2023-12-10 – 2023-12-11 (×3): 250 mg via ORAL
  Filled 2023-12-10 (×3): qty 1

## 2023-12-10 NOTE — Progress Notes (Signed)
 Referring Physician(s): Gross,S  Supervising Physician: Hughes Simmonds  Patient Status:  [facility] - In-pt  Chief Complaint: Abdominal/back pain, post op abdominal fluid collections; s/p drainage of parastomal fluid collection 12/3 ( Ultrasound confirms that both the parastomal and intraperitoneal perihepatic fluid collections decompressed through the single drainage catheter.)    Subjective: Patient sitting up in chair, only complaint is some mild-mod right sided abdominal pain at subcutaneous knot, denies fever, nausea, vomiting; not significantly tender at left mid abdominal drain   Allergies: Ozempic (0.25 or 0.5 mg-dose) [semaglutide(0.25 or 0.5mg -dos)], Ezetimibe, Gabapentin , Iodinated contrast media, Mesalamine, Oxycodone , Prilosec [omeprazole ], and Statins  Medications: Prior to Admission medications   Medication Sig Start Date End Date Taking? Authorizing Provider  acetaminophen  (TYLENOL ) 500 MG tablet Take 1,000 mg by mouth every 6 (six) hours as needed for mild pain or headache.   Yes [provider]  amLODipine  (NORVASC ) 10 MG tablet Take 10 mg by mouth at bedtime. 05/20/18  Yes [provider]  aspirin  EC 81 MG tablet Take 1 tablet (81 mg total) by mouth daily. Swallow whole. 03/20/21  Yes Patwardhan, Manish J, MD  B Complex Vitamins (B COMPLEX 100 PO) Take 1 tablet by mouth daily.   Yes [provider]  benazepril  (LOTENSIN ) 40 MG tablet Take 40 mg by mouth at bedtime. 05/14/18  Yes [provider]  calcium  carbonate (OS-CAL - DOSED IN MG OF ELEMENTAL CALCIUM ) 1250 (500 Ca) MG tablet Take 2 tablets by mouth 2 (two) times daily with a meal.   Yes [provider]  cyclobenzaprine  (FLEXERIL ) 5 MG tablet Take 1 tablet (5 mg total) by mouth 3 (three) times daily as needed for muscle spasms. 10/18/23  Yes Sheldon Standing, MD  Evolocumab  (REPATHA  SURECLICK) 140 MG/ML SOAJ Inject 140 mg (1 pen) into the skin every 14 days 07/08/23  Yes Patwardhan,  Manish J, MD  famotidine  (PEPCID ) 20 MG tablet Take 20 mg by mouth daily.   Yes [provider]  hydrochlorothiazide  (HYDRODIURIL ) 25 MG tablet Take 25 mg by mouth daily.   Yes [provider]  labetalol  (NORMODYNE ) 100 MG tablet TAKE 1 TABLET TWICE DAILY (NEED TO KEEP UPCOMING APPOINTMENT IN APRIL TO PREVENT ANY MISSED DOSES) Patient taking differently: Take 100 mg by mouth 2 (two) times daily. 05/03/23  Yes Patwardhan, Manish J, MD  latanoprost  (XALATAN ) 0.005 % ophthalmic solution Place 1 drop into both eyes at bedtime. 05/05/22  Yes [provider]  loperamide  (IMODIUM ) 2 MG capsule Take 1 capsule (2 mg total) by mouth 2 (two) times daily. Patient taking differently: Take 4 mg by mouth in the morning, at noon, and at bedtime. 06/04/19  Yes Teresa Lonni HERO, MD  Menthol -Methyl Salicylate (MUSCLE RUB) 10-15 % CREA Apply 1 application topically 2 (two) times daily as needed for muscle pain.   Yes [provider]  metoCLOPramide  (REGLAN ) 5 MG tablet Take 1 tablet (5 mg total) by mouth every 8 (eight) hours as needed for nausea or vomiting. 10/31/23  Yes Sheldon Standing, MD  Multiple Vitamin (MULTIVITAMIN WITH MINERALS) TABS tablet Take 1 tablet by mouth daily. Woman's one a day   Yes [provider]  pantoprazole  (PROTONIX ) 40 MG tablet Take 40 mg by mouth 2 (two) times daily before a meal.    Yes [provider]  polycarbophil (FIBERCON) 625 MG tablet Take 1 tablet (625 mg total) by mouth 2 (two) times daily. 11/12/23 12/12/23 Yes Patsy Lenis, MD  psyllium (HYDROCIL/METAMUCIL) 95 % PACK Take  1 packet by mouth daily. 10/30/23  Yes Sheldon Standing, MD  timolol  (TIMOPTIC ) 0.5 % ophthalmic solution Place 1 drop into both eyes every morning. 06/17/22  Yes [provider]  traMADol  (ULTRAM ) 50 MG tablet Take 1 tablet (50 mg total) by mouth every 6 (six) hours as needed for moderate pain (pain score 4-6) or severe pain (pain score 7-10). 11/12/23   Yes Patsy Lenis, MD  Vitamins-Lipotropics (COMPLEX B-100-INOSITOL) TBCR Take 1 tablet by mouth daily. 11/20/23  Yes [provider]  Multiple Vitamins-Iron  (MULTIVITAMINS WITH IRON ) TABS tablet Take 1 tablet by mouth daily. 11/13/23   Patsy Lenis, MD  traZODone  (DESYREL ) 50 MG tablet Take 50 mg by mouth at bedtime. 02/11/23   [provider]     Vital Signs: BP (!) 128/58 (BP Location: Right Arm)   Pulse 74   Temp 98.7 F (37.1 C)   Resp 15   Ht 5' 3 (1.6 m)   Wt 166 lb (75.3 kg)   SpO2 100%   BMI 29.41 kg/m   Physical Exam awake, alert.  Left abdominal drain intact, insertion site okay, not significantly tender, output yesterday 90 cc, 10 cc today of serous appearing fluid drain flushed earlier today without difficulty  Imaging: No results found.  Labs:  CBC: Recent Labs    12/07/23 1121 12/08/23 0606 12/09/23 0531 12/10/23 0601  WBC 8.7 8.9 7.2 6.7  HGB 10.2* 10.3* 9.8* 10.7*  HCT 29.5* 31.1* 30.1* 32.2*  PLT 377 362 348 372    COAGS: Recent Labs    11/09/23 0542 12/04/23 1059  INR 1.0 1.0    BMP: Recent Labs    12/07/23 1121 12/08/23 0606 12/09/23 0531 12/10/23 0601  NA 140 142 142 141  K 4.8 4.6 4.1 4.0  CL 107 107 108 108  CO2 25 25 24  21*  GLUCOSE 112* 103* 103* 116*  BUN 10 13 13 15   CALCIUM  9.2 9.8 9.3 9.7  CREATININE 0.93 0.91 0.93 0.93  GFRNONAA >60 >60 >60 >60    LIVER FUNCTION TESTS: Recent Labs    11/08/23 1114 11/09/23 0542 12/03/23 1646 12/07/23 1121 12/08/23 0606 12/09/23 0531 12/10/23 0601  BILITOT 0.5 0.6 0.4  --   --   --   --   AST 24 25 29   --   --   --   --   ALT 18 15 23   --   --   --   --   ALKPHOS 133* 116 133*  --   --   --   --   PROT 6.8 6.2* 6.8  --   --   --   --   ALBUMIN 3.3* 3.0* 3.2* 3.2* 3.3* 3.2* 3.4*    Assessment and Plan: 78 y.o. female with medical history significant for ulcerative colitis, colon cancer s/p colectomy with ileostomy, CKD-3A, GERD, DDD, OSA, incarcerated  abdominal wall hernia s/p surgery/repair on 10/18/23 who presented to the ED for evaluation of pain and drainage around her abdominal incision site.  Patient recently admitted from 11/7 - 11/11 for AKI, possible abdominal abscess around colectomy, and underwent  aspiration of abdominal fluid collection on 11/09/23 with neg culture.  She now presents with persistent abdominal pain and some drainage around surgical site since this past weekend.; on 12/3 had successful placement of a 12 French drainage catheter into the parastomal fluid collection with evacuation of 250 mL purulent fluid. Ultrasound confirmed that both the parastomal and intraperitoneal perihepatic fluid collections decompressed through the single  drainage catheter ; afebrile, WBC normal, hemoglobin stable, fluid cultures with Klebsiella, E. coli, strep intermedius and Eikenella; continue current treatment ;patient will be scheduled for follow-up IR drain clinic visit next week with CT and drain injection   Electronically Signed: D. Franky Rakers, PA-C 12/10/2023, 4:45 PM   I spent a total of 15 Minutes at the the patient's bedside AND on the patient's hospital floor or unit, greater than 50% of which was counseling/coordinating care for left mid abdominal abscess drain    Patient ID: Donna [name], female   DOB: 1945/01/06, 78 y.o.   MRN: [id number]

## 2023-12-10 NOTE — Plan of Care (Signed)
   Problem: Education: Goal: Knowledge of General Education information will improve Description Including pain rating scale, medication(s)/side effects and non-pharmacologic comfort measures Outcome: Progressing   Problem: Clinical Measurements: Goal: Ability to maintain clinical measurements within normal limits will improve Outcome: Progressing   Problem: Clinical Measurements: Goal: Will remain free from infection Outcome: Progressing

## 2023-12-10 NOTE — Progress Notes (Signed)
 12/10/2023  Donna [name] [id number] [birth year]  CARE TEAM: PCP: Clarice Nottingham, MD  Outpatient Care Team: Patient Care Team: Clarice Nottingham, MD as PCP - General (Internal Medicine) Elmira Newman PARAS, MD as PCP - Cardiology (Cardiology) Teresa Lonni HERO, MD as Consulting Physician (Colon and Rectal Surgery) Sheldon Standing, MD as Consulting Physician (General Surgery) Donnald Charleston, MD as Consulting Physician (Gastroenterology) Elmira Newman PARAS, MD as Consulting Physician (Cardiology)  Inpatient Treatment Team: Treatment Team:  Sebastian Toribio GAILS, MD Bobbette File, MD Sheldon Standing, MD Ruthellen Ruthellen Radiology, MD Leigh Darice BRAVO, PT [place], Burnard HERO, RN Estelle Hunter DEL, RN   Problem List:   Principal Problem:   Intra-abdominal infection Active Problems:   Hypertension   Status post ileostomy [facility])   History of hernia repair   CKD stage 3a, GFR 45-59 ml/min (HCC)   Generalized weakness   Anemia   Polymicrobial bacterial infection   Hx of colon cancer, stage I   10/18/2023  POST-OPERATIVE DIAGNOSIS:  PARASTOMAL HERNIA AND VENTRAL INCISIONAL RECURRENT AND INCARCERATED ABDOMINAL WALL HERNIAE  Dimensions of hernia post-op:  16cm x 8cm   PROCEDURE:   ROBOTIC COMPONENT SEPARATION (transversus abdominis release = TAR) on right Repair of incarcerated recurrent incisional and parastomal hernias with mesh ROBOTIC LYSIS OF ADHESIONS SEROSAL REPAIR x2 END ILEOSTOMY REVISION TAP BLOCK - BILATERAL   SURGEON:  Standing KYM Sheldon, MD  OR FINDINGS:  Moderately dense adhesions small bowel to anterior abdominal wall and periumbilical midline hernia.  Swiss cheese hernias extending up to the subxiphoid region.  15 cm in length.  Moderate parastomal hernia around end ileostomy with a foot of small bowel densely adherent to the hernia sac.  Serosal repairs down x 2.  Ileostomy revision done for last 7 cm given poor tissues.   Type of ventral wall  repair:  Laparoscopic underlay repair .with Primary repair of largest hernia Placement of mesh: Right lateral and flank retrorectus preperitoneal.  Midline intraperitoneal. Name of mesh: Bard Ventralight dual sided (polypropylene / Seprafilm) Size of mesh: 33x27cm Orientation: Transverse Mesh overlap:  7cm    Assessment [facility] Stay = 7 days)      Delayed abdominal fluid collection status post drainage with purulence concern for infected seroma  Plan:  IR drainage 12/3 concern for purulence -everything collapsed with a one drain fortunately Growing strep and Klebsiella.  Sensitivities returned.  Mostly pansensitive.   Continue piperacillin nadine.  Ruled out MRSA. Hopefully can adjust to outpatient antibiotic regimen today  Patient will need repeat CT scan with drain study next week = about 2 weeks from placement.  other concerning fluid collections.  Made request to ask IR to help arrange that  Awaiting antibiotic regimens per infectious disease.  See what they think.  Pt wants to avoid any more surgery.  Hopefully will resolve but odds are high.  Perhaps could be more isolated & can locally excise the last area.  We will see.    Right upper quadrant 3 x 3 cm subcutaneous nodule most likely part of the old hernia sac closing down or hematoma.  It is softer but still sensitive.  Not consistent with abscess   Low  hemoglobin in the setting of iron  deficiency anemia.    Give IV iron  since doubt good PO intake No extra anticoagulation for now Dropped hemoglobin getting transfused. Hgb now 9.8-10.3  Suspect this was more of an infected hematoma.  There is no evidence of any active GI bleeding.  No hematochezia or hematemesis.  Solid diet as tolerated.    Ileostomy pink with no leaking or concerns there.  Wound ostomy nursing team evaluated per my request and agrees.  Doubt perforation or separation there.    Stressed the importance of having strict I's and O's  including drain output as well as ileostomy output to make sure she is not getting dehydrated.  Continue usual bowel regimen with with fiber and loperamide .  Do twice daily regimen with low threshold to increase to 3 times daily    -monitor electrolytes & replace as needed.  Corrected hypokalemia.  Keep K>4, Mg>2, Phos>3  Hypertension, GERD, sleep apnea, etc. per primary service often uses oxygen at night for sleep apnea..  -VTE prophylaxis- SCDs.  Anticoagulation prophyllaxis SQ as appropriate  -mobilize as tolerated to help recovery.  Enlist therapies in moderate/high risk patients as appropriate  I updated the patient's status to the patient  Recommendations were made.  Questions were answered.  She expressed understanding & appreciation.  -Disposition: Stable to discharge from surgery standpoint.  Awaiting final antibiotic recommendations per infectious disease.   I reviewed nursing notes, last 24 h vitals and pain scores, last 48 h intake and output, last 24 h labs and trends, and last 24 h imaging results.  I have reviewed this patient's available data, including medical history, events of note, test results, etc as part of my evaluation.   A significant portion of that time was spent in counseling. Care during the described time interval was provided by me.  This care required moderate level of medical decision making.  12/10/2023    Subjective: (Chief complaint)  Eating okay.  Still sensitive and red for quadrant does not feel the lump is smaller.    Objective:  Vital signs:  Vitals:   12/09/23 0601 12/09/23 1320 12/09/23 2002 12/10/23 0513  BP: (!) 127/55 (!) 129/55 (!) 146/69 (!) 142/68  Pulse: 79 77 77 79  Resp: 16 19 17 17   Temp: 98.9 F (37.2 C) 98.5 F (36.9 C) 98.7 F (37.1 C) 98.9 F (37.2 C)  TempSrc:   Oral Oral  SpO2: 95% 96% 97% 94%  Weight:      Height:        Last BM Date : 12/09/23  Intake/Output   Yesterday:  12/08 0701 - 12/09  0700 In: 480 [P.O.:480] Out: 540 [Drains:90; Stool:450] This shift:  No intake/output data recorded.  Bowel function:  Flatus: YES  BM:  YES  Drain: Left periumbilical drain in place with thin serous effluent.  No more purulence   Physical Exam:  General: Pt awake/alert in no acute distress.  Walking around the room with no assistance.  Smiling.  Not toxic.  Not sickly.  This is the best time seen her .  Calm.   Eyes: PERRL, normal EOM.  Sclera clear.  No icterus Neuro: CN II-XII intact w/o focal sensory/motor deficits. Lymph: No head/neck/groin lymphadenopathy Psych:  No delerium/psychosis/paranoia.  Oriented x 4 HENT: Normocephalic, Mucus membranes moist.  No thrush Neck: Supple, No tracheal deviation.  No obvious thyromegaly Chest: No pain to chest wall compression.  Good respiratory excursion.  No audible wheezing CV:  Pulses intact.  Regular rhythm.  No major extremity edema MS: Normal AROM mjr joints.  No obvious deformity  Abdomen: Obese.  Sensitive only in right upper quadrant subcostal 3 x 3 cm SQ knot.  Sensitive to light touch but softer.  Does not appear to be fluctuant like an abscess.   Rest of the abdomen  soft.   Swelling gone down.  No peau d'orange nor cellulitis.    Ileostomy pink.  No evidence of any separation around the edges or necrosis or dehiscence.  No infection abscess or purulence there.  Flatus and oatmeal thick effluent from ileostomy.  The rest the abdomen is  Soft.  Nondistended.    Ext:   No deformity.  No mjr edema.  No cyanosis Skin: No petechiae / purpurea.  No major sores.  Warm and dry    Results:   Cultures: Recent Results (from the past 720 hours)  Aerobic/Anaerobic Culture w Gram Stain (surgical/deep wound)     Status: None (Preliminary result)   Collection Time: 12/04/23  1:50 PM   Specimen: Abscess  Result Value Ref Range Status   Specimen Description   Final    ABSCESS Performed at [facility], [address] [place], [place] [address]    Special Requests   Final    NONE Performed at [facility], [address], [place], [place] [address]    Gram Stain   Final    ABUNDANT WBC PRESENT, PREDOMINANTLY PMN ABUNDANT GRAM POSITIVE COCCI IN PAIRS IN CHAINS    Culture   Final    FEW KLEBSIELLA PNEUMONIAE RARE ESCHERICHIA COLI ABUNDANT STREPTOCOCCUS INTERMEDIUS SUSCEPTIBILITIES TO FOLLOW ABUNDANT EIKENELLA CORRODENS Usually susceptible to penicillin and other beta lactam agents,quinolones,macrolides and tetracyclines. NO ANAEROBES ISOLATED Performed at [facility], [address], [place], [place] [address]    Report Status PENDING  Incomplete   Organism ID, Bacteria KLEBSIELLA PNEUMONIAE  Final   Organism ID, Bacteria ESCHERICHIA COLI  Final      Susceptibility   Escherichia coli - MIC*    AMPICILLIN 8 SENSITIVE Sensitive     CEFAZOLIN  (NON-URINE) <=1 SENSITIVE Sensitive     CEFEPIME  <=0.12 SENSITIVE Sensitive     ERTAPENEM <=0.12 SENSITIVE Sensitive     CEFTRIAXONE <=0.25 SENSITIVE Sensitive     CIPROFLOXACIN  <=0.06 SENSITIVE Sensitive     GENTAMICIN <=1 SENSITIVE Sensitive     MEROPENEM <=0.25 SENSITIVE Sensitive     TRIMETH/SULFA <=20 SENSITIVE Sensitive     AMPICILLIN/SULBACTAM <=2 SENSITIVE Sensitive     PIP/TAZO Value in next row Sensitive      <=4 SENSITIVEThis is a modified FDA-approved test that has been validated and its performance characteristics determined by the reporting laboratory.  This laboratory is certified under the Clinical Laboratory Improvement Amendments CLIA as qualified to perform high complexity clinical laboratory testing.    * RARE ESCHERICHIA COLI   Klebsiella pneumoniae - MIC*    AMPICILLIN Value in next row Resistant      <=4 SENSITIVEThis is a modified FDA-approved test that has been validated and its performance characteristics determined by the reporting laboratory.  This laboratory is certified under the Clinical Laboratory  Improvement Amendments CLIA as qualified to perform high complexity clinical laboratory testing.    CEFAZOLIN  (NON-URINE) Value in next row Sensitive      <=4 SENSITIVEThis is a modified FDA-approved test that has been validated and its performance characteristics determined by the reporting laboratory.  This laboratory is certified under the Clinical Laboratory Improvement Amendments CLIA as qualified to perform high complexity clinical laboratory testing.    CEFEPIME  Value in next row Sensitive      <=4 SENSITIVEThis is a modified FDA-approved test that has been validated and its performance characteristics determined by the reporting laboratory.  This laboratory is certified under the Clinical Laboratory Improvement  Amendments CLIA as qualified to perform high complexity clinical laboratory testing.    ERTAPENEM Value in next row Sensitive      <=4 SENSITIVEThis is a modified FDA-approved test that has been validated and its performance characteristics determined by the reporting laboratory.  This laboratory is certified under the Clinical Laboratory Improvement Amendments CLIA as qualified to perform high complexity clinical laboratory testing.    CEFTRIAXONE Value in next row Sensitive      <=4 SENSITIVEThis is a modified FDA-approved test that has been validated and its performance characteristics determined by the reporting laboratory.  This laboratory is certified under the Clinical Laboratory Improvement Amendments CLIA as qualified to perform high complexity clinical laboratory testing.    CIPROFLOXACIN  Value in next row Sensitive      <=4 SENSITIVEThis is a modified FDA-approved test that has been validated and its performance characteristics determined by the reporting laboratory.  This laboratory is certified under the Clinical Laboratory Improvement Amendments CLIA as qualified to perform high complexity clinical laboratory testing.    GENTAMICIN Value in next row Sensitive      <=4  SENSITIVEThis is a modified FDA-approved test that has been validated and its performance characteristics determined by the reporting laboratory.  This laboratory is certified under the Clinical Laboratory Improvement Amendments CLIA as qualified to perform high complexity clinical laboratory testing.    MEROPENEM Value in next row Sensitive      <=4 SENSITIVEThis is a modified FDA-approved test that has been validated and its performance characteristics determined by the reporting laboratory.  This laboratory is certified under the Clinical Laboratory Improvement Amendments CLIA as qualified to perform high complexity clinical laboratory testing.    TRIMETH/SULFA Value in next row Sensitive      <=4 SENSITIVEThis is a modified FDA-approved test that has been validated and its performance characteristics determined by the reporting laboratory.  This laboratory is certified under the Clinical Laboratory Improvement Amendments CLIA as qualified to perform high complexity clinical laboratory testing.    AMPICILLIN/SULBACTAM Value in next row Sensitive      <=4 SENSITIVEThis is a modified FDA-approved test that has been validated and its performance characteristics determined by the reporting laboratory.  This laboratory is certified under the Clinical Laboratory Improvement Amendments CLIA as qualified to perform high complexity clinical laboratory testing.    PIP/TAZO Value in next row Sensitive      <=4 SENSITIVEThis is a modified FDA-approved test that has been validated and its performance characteristics determined by the reporting laboratory.  This laboratory is certified under the Clinical Laboratory Improvement Amendments CLIA as qualified to perform high complexity clinical laboratory testing.    * FEW KLEBSIELLA PNEUMONIAE    Labs: Results for orders placed or performed during the hospital encounter of 12/03/23 (from the past 48 hours)  Renal function panel     Status: Abnormal   Collection  Time: 12/09/23  5:31 AM  Result Value Ref Range   Sodium 142 135 - 145 mmol/L   Potassium 4.1 3.5 - 5.1 mmol/L   Chloride 108 98 - 111 mmol/L   CO2 24 22 - 32 mmol/L   Glucose, Bld 103 (H) 70 - 99 mg/dL    Comment: Glucose reference range applies only to samples taken after fasting for at least 8 hours.   BUN 13 8 - 23 mg/dL   Creatinine, Ser 9.06 0.44 - 1.00 mg/dL   Calcium  9.3 8.9 - 10.3 mg/dL   Phosphorus 2.0 (L) 2.5 - 4.6 mg/dL  Albumin 3.2 (L) 3.5 - 5.0 g/dL   GFR, Estimated >39 >39 mL/min    Comment: (NOTE) Calculated using the CKD-EPI Creatinine Equation (2021)    Anion gap 10 5 - 15    Comment: Performed at [facility], [address], [place], [place] [address]  CBC with Differential/Platelet     Status: Abnormal   Collection Time: 12/09/23  5:31 AM  Result Value Ref Range   WBC 7.2 4.0 - 10.5 K/uL   RBC 3.36 (L) 3.87 - 5.11 MIL/uL   Hemoglobin 9.8 (L) 12.0 - 15.0 g/dL   HCT 69.8 (L) 63.9 - 53.9 %   MCV 89.6 80.0 - 100.0 fL   MCH 29.2 26.0 - 34.0 pg   MCHC 32.6 30.0 - 36.0 g/dL   RDW 82.1 (H) 88.4 - 84.4 %   Platelets 348 150 - 400 K/uL   nRBC 0.0 0.0 - 0.2 %   Neutrophils Relative % 67 %   Neutro Abs 4.8 1.7 - 7.7 K/uL   Lymphocytes Relative 14 %   Lymphs Abs 1.0 0.7 - 4.0 K/uL   Monocytes Relative 12 %   Monocytes Absolute 0.8 0.1 - 1.0 K/uL   Eosinophils Relative 5 %   Eosinophils Absolute 0.4 0.0 - 0.5 K/uL   Basophils Relative 1 %   Basophils Absolute 0.1 0.0 - 0.1 K/uL   Immature Granulocytes 1 %   Abs Immature Granulocytes 0.08 (H) 0.00 - 0.07 K/uL    Comment: Performed at [facility], [address], [place], [place] [address]  Magnesium      Status: None   Collection Time: 12/09/23  5:31 AM  Result Value Ref Range   Magnesium  1.8 1.7 - 2.4 mg/dL    Comment: Performed at [facility], [address], [place], [place] [address]  Renal function panel     Status: Abnormal   Collection Time:  12/10/23  6:01 AM  Result Value Ref Range   Sodium 141 135 - 145 mmol/L   Potassium 4.0 3.5 - 5.1 mmol/L   Chloride 108 98 - 111 mmol/L   CO2 21 (L) 22 - 32 mmol/L   Glucose, Bld 116 (H) 70 - 99 mg/dL    Comment: Glucose reference range applies only to samples taken after fasting for at least 8 hours.   BUN 15 8 - 23 mg/dL   Creatinine, Ser 9.06 0.44 - 1.00 mg/dL   Calcium  9.7 8.9 - 10.3 mg/dL   Phosphorus 2.5 2.5 - 4.6 mg/dL   Albumin 3.4 (L) 3.5 - 5.0 g/dL   GFR, Estimated >39 >39 mL/min    Comment: (NOTE) Calculated using the CKD-EPI Creatinine Equation (2021)    Anion gap 13 5 - 15    Comment: Performed at [facility], [address] [place], [place] [address]  CBC with Differential/Platelet     Status: Abnormal   Collection Time: 12/10/23  6:01 AM  Result Value Ref Range   WBC 6.7 4.0 - 10.5 K/uL   RBC 3.67 (L) 3.87 - 5.11 MIL/uL   Hemoglobin 10.7 (L) 12.0 - 15.0 g/dL   HCT 67.7 (L) 63.9 - 53.9 %   MCV 87.7 80.0 - 100.0 fL   MCH 29.2 26.0 - 34.0 pg   MCHC 33.2 30.0 - 36.0 g/dL   RDW 82.3 (H) 88.4 - 84.4 %   Platelets 372 150 - 400 K/uL   nRBC 0.0 0.0 - 0.2 %   Neutrophils Relative %  68 %   Neutro Abs 4.6 1.7 - 7.7 K/uL   Lymphocytes Relative 13 %   Lymphs Abs 0.9 0.7 - 4.0 K/uL   Monocytes Relative 10 %   Monocytes Absolute 0.7 0.1 - 1.0 K/uL   Eosinophils Relative 6 %   Eosinophils Absolute 0.4 0.0 - 0.5 K/uL   Basophils Relative 2 %   Basophils Absolute 0.1 0.0 - 0.1 K/uL   Immature Granulocytes 1 %   Abs Immature Granulocytes 0.05 0.00 - 0.07 K/uL    Comment: Performed at [facility], [address], [place], [place] [address]     Imaging / Studies: No results found.    Medications / Allergies: per chart  Antibiotics: Anti-infectives (From admission, onward)    Start     Dose/Rate Route Frequency Ordered Stop   12/03/23 2300  piperacillin -tazobactam (ZOSYN ) IVPB 3.375 g        3.375 g 12.5 mL/hr over 240  Minutes Intravenous Every 8 hours 12/03/23 2247           Note: Portions of this report may have been transcribed using voice recognition software. Every effort was made to ensure accuracy; however, inadvertent computerized transcription errors may be present.   Any transcriptional errors that result from this process are unintentional.    Elspeth KYM Schultze, MD, FACS, MASCRS Esophageal, Gastrointestinal & Colorectal Surgery Robotic and Minimally Invasive Surgery  [facility] [address], [address] [place], [place] [address] [phone] Fax [phone] Main  CONTACT INFORMATION: Weekday (9AM-5PM): Call [facility] main office at [phone] Weeknight (5PM-9AM) or Weekend/Holiday: Check EPIC Web Links tab & use AMION (password  [email or web address]) for General Surgery [facility] coverage  Please, DO NOT use SecureChat  (it is not reliable communication to reach operating surgeons & will lead to a delay in care).   Epic staff messaging available for outpatient concerns needing 1-2 business day response.      12/10/2023  8:20 AM

## 2023-12-10 NOTE — Care Management Important Message (Signed)
 Important Message  Patient Details IM Letter given. Name: Donna [name] Dpwija MRN: [id number] Date of Birth: [birth year]   Important Message Given:  Yes - Medicare IM     Melba Ates 12/10/2023, 1:50 PM

## 2023-12-10 NOTE — Progress Notes (Signed)
 PROGRESS NOTE    Donna [name]  FMW:[id number] DOB: 09-Oct-1945 DOA: 12/03/2023 PCP: Clarice Nottingham, MD    Chief Complaint  Patient presents with   Post-op Problem    Brief Narrative:  Donna [name] is a 78 y.o. female with medical history significant for ulcerative colitis, colon cancer s/p colectomy with ileostomy, CKD-3A, GERD, DDD, OSA, incarcerated abdominal wall hernia s/p surgery/repair on 10/18/23 who presented to the ED for evaluation of pain and drainage around her abdominal incision site and admitted for intra-abdominal infection.  Patient placed empirically on IV antibiotics.  General surgery consulted.  IR also consulted for drain placement per general surgery.     Assessment & Plan:   Principal Problem:   Intra-abdominal infection Active Problems:   Hypertension   Status post ileostomy (HCC)   History of hernia repair   CKD stage 3a, GFR 45-59 ml/min (HCC)   Generalized weakness   Anemia   Polymicrobial bacterial infection   Hx of colon cancer, stage I  #1 intra-abdominal infection/history of incarcerated abdominal wall hernia s/p repair -Patient noted with multiple hospitalizations over the past month for complications related to recent hernia repair. - Patient presented with diffuse abdominal pain x 3 to 4 days prior to admission with increased drainage and pain around surgical site. - CT abdomen and pelvis with multiple loculated fluid collections surrounding the ileostomy with increasing punctuate gas, suggestive of superinfection and/or enteric communication and relative hyperdensity suggestive of hemorrhagic fluid collections that have enlarged since prior examination. - Patient seen in consultation by general surgery who has asked IR to place drains in collections for now and cultures to be obtained. - It is noted per general surgery the last fluid collection aspiration of the abdominal fluid collection 11/8 was consistent with seroma and cultures  negative. -Patient seen by IR and patient status post 12 French drain placement and left mid abdominal region with evacuation of 250 cc of purulent fluid, 12/04/2023. -General surgery feels patient will need a repeat scan to ensure abscesses are resolving as well as drain study in about 2 weeks from drain placement. - Cultures from abscess with few Klebsiella pneumonia, Streptococcus intermedius, eikenella corrodens.  - Continue empiric IV Zosyn . -Patient seen in consultation by ID who are recommending continuation of IV Zosyn  for now, follow-up abscess cultures and feel patient would likely need protracted antibiotics with close follow-up with ID, general surgery and interventional radiology. -Per IR patient will be scheduled for follow-up IR drain clinic next week with CT and drain injection. - Per general surgery.    #2  CKD 3A -Creatinine noted at 1.32 which is not significantly different from recent baseline but slightly worse than 0.99 3 weeks ago. - Renal function improved with hydration and creatinine currently at 0.93. - Follow.  3.  Hypertension  - Controlled on Norvasc  10 mg daily, Lotensin  40 mg daily, labetalol  100 mg twice daily.   4.  Colon cancer status post colectomy with ileostomy -Patient noted with good output from ileostomy bag. -Ileostomy bag noted to have burst x 2 per patient on 12/09/2023. -Wound care RN instructed patient's floor RN on management of ileostomy bag.. - Continue FiberCon, loperamide .  5.  GERD - Continue Protonix , Pepcid .    6.  Chronic leg swelling -Patient noted on admission with 1+ bilateral lower extremity edema on exam unchanged. - TED hose.  7.  Generalized weakness -In the setting of acute illness. - PT/OT following and recommending home health.  8.  Anemia -  Likely anemia of chronic disease and possible hematoma noted on CT abdomen and pelvis.. - Patient with no overt bleeding. - Anemia panel from 11/08/2023 with iron  level of 15,  ferritin of 467, folate of > 20.  Vitamin B12 1740. -Hemoglobin noted at 6.7 on 12/05/2023.  - Status post transfusion 2 months PRBCs with hemoglobin currently at 10.7 this morning.  - Follow H&H. - Transfusion threshold hemoglobin < 7.  9.  Hypophosphatemia/hypomagnesemia/hypokalemia - Magnesium  1.8.  - Phosphorus at 2.5. - Potassium of 4.0.   - Continue oral K-Phos 250 mg p.o. twice daily x 3 days.    - Repeat labs in AM.     DVT prophylaxis: SCDs.  Code Status: Full Family Communication: Updated patient.  No family at bedside.  Disposition: TBD  Status is: Inpatient Remains inpatient appropriate because: Severity of illness   Consultants:  General Surgery: Dr. Sheldon 12/04/2023 Interventional radiology: Dr. Karalee 12/04/2023 ID: Dr.Van Dam 12/06/2023  Procedures:  CT abdomen and pelvis 12/03/2023 Transfuse 2 units PRBCs. Placement of 12 French drain via left mid abdominal approach with evacuation of 250 cc of purulent fluid per IR: Dr. Karalee 12/04/2023.  Antimicrobials:  Anti-infectives (From admission, onward)    Start     Dose/Rate Route Frequency Ordered Stop   12/03/23 2300  piperacillin -tazobactam (ZOSYN ) IVPB 3.375 g        3.375 g 12.5 mL/hr over 240 Minutes Intravenous Every 8 hours 12/03/23 2247           Subjective: Patient just finished ambulating with therapy.  Sitting up in recliner.  Denies any chest pain or shortness of breath.  States right abdominal pain slowly improving however still with tenderness to palpation.   Objective: Vitals:   12/09/23 0601 12/09/23 1320 12/09/23 2002 12/10/23 0513  BP: (!) 127/55 (!) 129/55 (!) 146/69 (!) 142/68  Pulse: 79 77 77 79  Resp: 16 19 17 17   Temp: 98.9 F (37.2 C) 98.5 F (36.9 C) 98.7 F (37.1 C) 98.9 F (37.2 C)  TempSrc:   Oral Oral  SpO2: 95% 96% 97% 94%  Weight:      Height:        Intake/Output Summary (Last 24 hours) at 12/10/2023 1229 Last data filed at 12/10/2023 1053 Gross per 24 hour   Intake --  Output 450 ml  Net -450 ml   Filed Weights   12/03/23 1640  Weight: 75.3 kg    Examination:  General exam: NAD. Respiratory system: CTAB.  No wheezes, no crackles, no rhonchi.  Fair air movement.  Speaking in full sentences.  No use of accessory muscles of respiration.  Cardiovascular system: Regular rate rhythm no murmurs rubs or gallops.  No JVD.  No lower extremity edema. Gastrointestinal system: Abdomen is soft, nondistended, some tenderness to palpation in the right abdominal region.  Decreased tenderness to palpation in the left abdominal region.  JP drain in place with serosanguineous drainage.  Ileostomy bag with stool noted.  Central nervous system: Alert and oriented.  Moving extremities spontaneously.  No focal neurological deficits. Extremities: Symmetric 5 x 5 power. Skin: No rashes, lesions or ulcers Psychiatry: Judgement and insight appear normal. Mood & affect appropriate.     Data Reviewed: I have personally reviewed following labs and imaging studies  CBC: Recent Labs  Lab 12/05/23 0032 12/06/23 0321 12/07/23 1121 12/08/23 0606 12/09/23 0531 12/10/23 0601  WBC 7.9 8.8 8.7 8.9 7.2 6.7  NEUTROABS 5.4 6.1  --  6.5 4.8 4.6  HGB 6.7*  9.4* 10.2* 10.3* 9.8* 10.7*  HCT 19.6* 26.9* 29.5* 31.1* 30.1* 32.2*  MCV 84.8 84.3 86.5 87.1 89.6 87.7  PLT 426* 352 377 362 348 372    Basic Metabolic Panel: Recent Labs  Lab 12/05/23 0032 12/06/23 0321 12/07/23 1121 12/08/23 0606 12/09/23 0531 12/10/23 0601  NA 142 143 140 142 142 141  K 3.3* 4.3 4.8 4.6 4.1 4.0  CL 107 108 107 107 108 108  CO2 25 25 25 25 24  21*  GLUCOSE 94 93 112* 103* 103* 116*  BUN 15 11 10 13 13 15   CREATININE 1.04* 0.91 0.93 0.91 0.93 0.93  CALCIUM  8.6* 8.6* 9.2 9.8 9.3 9.7  MG 1.6* 2.1 1.9 1.9 1.8  --   PHOS 1.8* 2.8 1.7* 2.4* 2.0* 2.5    GFR: Estimated Creatinine Clearance: 50 mL/min (by C-G formula based on SCr of 0.93 mg/dL).  Liver Function Tests: Recent Labs  Lab  12/03/23 1646 12/07/23 1121 12/08/23 0606 12/09/23 0531 12/10/23 0601  AST 29  --   --   --   --   ALT 23  --   --   --   --   ALKPHOS 133*  --   --   --   --   BILITOT 0.4  --   --   --   --   PROT 6.8  --   --   --   --   ALBUMIN 3.2* 3.2* 3.3* 3.2* 3.4*    CBG: No results for input(s): GLUCAP in the last 168 hours.   Recent Results (from the past 240 hours)  Aerobic/Anaerobic Culture w Gram Stain (surgical/deep wound)     Status: None (Preliminary result)   Collection Time: 12/04/23  1:50 PM   Specimen: Abscess  Result Value Ref Range Status   Specimen Description   Final    ABSCESS Performed at [facility], [address], [place], [place] [address]    Special Requests   Final    NONE Performed at [facility], [address], [place], [place] [address]    Gram Stain   Final    ABUNDANT WBC PRESENT, PREDOMINANTLY PMN ABUNDANT GRAM POSITIVE COCCI IN PAIRS IN CHAINS Performed at [facility], [address], [place], [place] [address]    Culture   Final    FEW KLEBSIELLA PNEUMONIAE RARE ESCHERICHIA COLI ABUNDANT STREPTOCOCCUS INTERMEDIUS SUSCEPTIBILITIES TO FOLLOW ABUNDANT EIKENELLA CORRODENS Usually susceptible to penicillin and other beta lactam agents,quinolones,macrolides and tetracyclines. NO ANAEROBES ISOLATED    Report Status PENDING  Incomplete   Organism ID, Bacteria KLEBSIELLA PNEUMONIAE  Final   Organism ID, Bacteria ESCHERICHIA COLI  Final      Susceptibility   Escherichia coli - MIC*    AMPICILLIN 8 SENSITIVE Sensitive     CEFAZOLIN  (NON-URINE) <=1 SENSITIVE Sensitive     CEFEPIME  <=0.12 SENSITIVE Sensitive     ERTAPENEM <=0.12 SENSITIVE Sensitive     CEFTRIAXONE <=0.25 SENSITIVE Sensitive     CIPROFLOXACIN  <=0.06 SENSITIVE Sensitive     GENTAMICIN <=1 SENSITIVE Sensitive     MEROPENEM <=0.25 SENSITIVE Sensitive     TRIMETH/SULFA <=20 SENSITIVE Sensitive     AMPICILLIN/SULBACTAM <=2 SENSITIVE  Sensitive     PIP/TAZO Value in next row Sensitive      <=4 SENSITIVEThis is a modified FDA-approved test that has been validated and its performance characteristics determined by the reporting laboratory.  This laboratory is certified under the Clinical Laboratory Improvement Amendments CLIA  as qualified to perform high complexity clinical laboratory testing.    * RARE ESCHERICHIA COLI   Klebsiella pneumoniae - MIC*    AMPICILLIN Value in next row Resistant      <=4 SENSITIVEThis is a modified FDA-approved test that has been validated and its performance characteristics determined by the reporting laboratory.  This laboratory is certified under the Clinical Laboratory Improvement Amendments CLIA as qualified to perform high complexity clinical laboratory testing.    CEFAZOLIN  (NON-URINE) Value in next row Sensitive      <=4 SENSITIVEThis is a modified FDA-approved test that has been validated and its performance characteristics determined by the reporting laboratory.  This laboratory is certified under the Clinical Laboratory Improvement Amendments CLIA as qualified to perform high complexity clinical laboratory testing.    CEFEPIME  Value in next row Sensitive      <=4 SENSITIVEThis is a modified FDA-approved test that has been validated and its performance characteristics determined by the reporting laboratory.  This laboratory is certified under the Clinical Laboratory Improvement Amendments CLIA as qualified to perform high complexity clinical laboratory testing.    ERTAPENEM Value in next row Sensitive      <=4 SENSITIVEThis is a modified FDA-approved test that has been validated and its performance characteristics determined by the reporting laboratory.  This laboratory is certified under the Clinical Laboratory Improvement Amendments CLIA as qualified to perform high complexity clinical laboratory testing.    CEFTRIAXONE Value in next row Sensitive      <=4 SENSITIVEThis is a modified  FDA-approved test that has been validated and its performance characteristics determined by the reporting laboratory.  This laboratory is certified under the Clinical Laboratory Improvement Amendments CLIA as qualified to perform high complexity clinical laboratory testing.    CIPROFLOXACIN  Value in next row Sensitive      <=4 SENSITIVEThis is a modified FDA-approved test that has been validated and its performance characteristics determined by the reporting laboratory.  This laboratory is certified under the Clinical Laboratory Improvement Amendments CLIA as qualified to perform high complexity clinical laboratory testing.    GENTAMICIN Value in next row Sensitive      <=4 SENSITIVEThis is a modified FDA-approved test that has been validated and its performance characteristics determined by the reporting laboratory.  This laboratory is certified under the Clinical Laboratory Improvement Amendments CLIA as qualified to perform high complexity clinical laboratory testing.    MEROPENEM Value in next row Sensitive      <=4 SENSITIVEThis is a modified FDA-approved test that has been validated and its performance characteristics determined by the reporting laboratory.  This laboratory is certified under the Clinical Laboratory Improvement Amendments CLIA as qualified to perform high complexity clinical laboratory testing.    TRIMETH/SULFA Value in next row Sensitive      <=4 SENSITIVEThis is a modified FDA-approved test that has been validated and its performance characteristics determined by the reporting laboratory.  This laboratory is certified under the Clinical Laboratory Improvement Amendments CLIA as qualified to perform high complexity clinical laboratory testing.    AMPICILLIN/SULBACTAM Value in next row Sensitive      <=4 SENSITIVEThis is a modified FDA-approved test that has been validated and its performance characteristics determined by the reporting laboratory.  This laboratory is certified under  the Clinical Laboratory Improvement Amendments CLIA as qualified to perform high complexity clinical laboratory testing.    PIP/TAZO Value in next row Sensitive      <=4 SENSITIVEThis is a modified FDA-approved test that has been validated  and its performance characteristics determined by the reporting laboratory.  This laboratory is certified under the Clinical Laboratory Improvement Amendments CLIA as qualified to perform high complexity clinical laboratory testing.    * FEW KLEBSIELLA PNEUMONIAE         Radiology Studies: No results found.       Scheduled Meds:  sodium chloride    Intravenous Once   acetaminophen   500 mg Oral TID WC & HS   amLODipine   10 mg Oral QHS   aspirin  EC  81 mg Oral Daily   benazepril   40 mg Oral QHS   calcium  carbonate  2 tablet Oral BID WC   famotidine   20 mg Oral Daily   feeding supplement  237 mL Oral BID BM   labetalol   100 mg Oral BID   latanoprost   1 drop Both Eyes QHS   lidocaine   15 mL Oral Once   loperamide   2 mg Oral TID   multivitamins with iron   1 tablet Oral Daily   pantoprazole   40 mg Oral BID AC   phosphorus  250 mg Oral BID   polycarbophil  625 mg Oral BID   sodium chloride  flush  10-40 mL Intracatheter Q12H   sodium chloride  flush  5 mL Intracatheter Q8H   timolol   1 drop Both Eyes Daily   Continuous Infusions:  piperacillin -tazobactam (ZOSYN )  IV 3.375 g (12/10/23 0618)     LOS: 7 days    Time spent: 35 minutes    Toribio Hummer, MD [facility]   To contact the attending provider between 7A-7P or the covering provider during after hours 7P-7A, please log into the web site [email or web address] and access using universal [place] password for that web site. If you do not have the password, please call the hospital operator.  12/10/2023, 12:29 PM

## 2023-12-10 NOTE — Progress Notes (Signed)
 Subjective:  No new complaints  Antibiotics:  Anti-infectives (From admission, onward)    Start     Dose/Rate Route Frequency Ordered Stop   12/03/23 2300  piperacillin -tazobactam (ZOSYN ) IVPB 3.375 g        3.375 g 12.5 mL/hr over 240 Minutes Intravenous Every 8 hours 12/03/23 2247         Medications: Scheduled Meds:  sodium chloride    Intravenous Once   acetaminophen   500 mg Oral TID WC & HS   amLODipine   10 mg Oral QHS   aspirin  EC  81 mg Oral Daily   benazepril   40 mg Oral QHS   calcium  carbonate  2 tablet Oral BID WC   famotidine   20 mg Oral Daily   labetalol   100 mg Oral BID   latanoprost   1 drop Both Eyes QHS   lidocaine   15 mL Oral Once   loperamide   2 mg Oral TID   multivitamins with iron   1 tablet Oral Daily   pantoprazole   40 mg Oral BID AC   phosphorus  250 mg Oral BID   polycarbophil  625 mg Oral BID   Ensure Max Protein  11 oz Oral BID   sodium chloride  flush  10-40 mL Intracatheter Q12H   sodium chloride  flush  5 mL Intracatheter Q8H   timolol   1 drop Both Eyes Daily   Continuous Infusions:  piperacillin -tazobactam (ZOSYN )  IV 3.375 g (12/10/23 1528)   PRN Meds:.bisacodyl , HYDROcodone -acetaminophen , HYDROmorphone  (DILAUDID ) injection, loperamide , methocarbamol  (ROBAXIN ) injection, methocarbamol , ondansetron  **OR** ondansetron  (ZOFRAN ) IV, mouth rinse, sodium chloride  flush    Objective: Weight change:   Intake/Output Summary (Last 24 hours) at 12/10/2023 1613 Last data filed at 12/10/2023 1310 Gross per 24 hour  Intake 240 ml  Output 510 ml  Net -270 ml   Blood pressure (!) 128/58, pulse 74, temperature 98.7 F (37.1 C), resp. rate 15, height 5' 3 (1.6 m), weight 75.3 kg, SpO2 100%. Temp:  [98.7 F (37.1 C)-98.9 F (37.2 C)] 98.7 F (37.1 C) (12/09 1257) Pulse Rate:  [74-79] 74 (12/09 1257) Resp:  [15-17] 15 (12/09 1257) BP: (128-146)/(58-69) 128/58 (12/09 1257) SpO2:  [94 %-100 %] 100 % (12/09 1257)  Physical  Exam: Physical Exam Constitutional:      General: She is not in acute distress.    Appearance: She is well-developed. She is not diaphoretic.  HENT:     Head: Normocephalic and atraumatic.     Right Ear: External ear normal.     Left Ear: External ear normal.     Mouth/Throat:     Pharynx: No oropharyngeal exudate.  Eyes:     General: No scleral icterus.    Conjunctiva/sclera: Conjunctivae normal.     Pupils: Pupils are equal, round, and reactive to light.  Cardiovascular:     Rate and Rhythm: Normal rate and regular rhythm.  Pulmonary:     Effort: Pulmonary effort is normal. No respiratory distress.     Breath sounds: No wheezing.  Abdominal:     General: There is no distension.     Palpations: Abdomen is soft.  Musculoskeletal:        General: No tenderness. Normal range of motion.  Lymphadenopathy:     Cervical: No cervical adenopathy.  Skin:    General: Skin is warm and dry.     Coloration: Skin is not pale.     Findings: No erythema or rash.  Neurological:     General: No  focal deficit present.     Mental Status: She is alert and oriented to person, place, and time.     Motor: No abnormal muscle tone.     Coordination: Coordination normal.  Psychiatric:        Mood and Affect: Mood normal.        Behavior: Behavior normal.        Thought Content: Thought content normal.        Judgment: Judgment normal.      CBC:    BMET Recent Labs    12/09/23 0531 12/10/23 0601  NA 142 141  K 4.1 4.0  CL 108 108  CO2 24 21*  GLUCOSE 103* 116*  BUN 13 15  CREATININE 0.93 0.93  CALCIUM  9.3 9.7     Liver Panel  Recent Labs    12/09/23 0531 12/10/23 0601  ALBUMIN 3.2* 3.4*       Sedimentation Rate No results for input(s): ESRSEDRATE in the last 72 hours. C-Reactive Protein No results for input(s): CRP in the last 72 hours.  Micro Results: Recent Results (from the past 720 hours)  Aerobic/Anaerobic Culture w Gram Stain (surgical/deep wound)      Status: None (Preliminary result)   Collection Time: 12/04/23  1:50 PM   Specimen: Abscess  Result Value Ref Range Status   Specimen Description   Final    ABSCESS Performed at [facility], [address], [place], [place] [address]    Special Requests   Final    NONE Performed at [facility], [address], [place], [place] [address]    Gram Stain   Final    ABUNDANT WBC PRESENT, PREDOMINANTLY PMN ABUNDANT GRAM POSITIVE COCCI IN PAIRS IN CHAINS Performed at [facility], [address] [place], [place] [address]    Culture   Final    FEW KLEBSIELLA PNEUMONIAE RARE ESCHERICHIA COLI ABUNDANT STREPTOCOCCUS INTERMEDIUS SUSCEPTIBILITIES TO FOLLOW ABUNDANT EIKENELLA CORRODENS Usually susceptible to penicillin and other beta lactam agents,quinolones,macrolides and tetracyclines. NO ANAEROBES ISOLATED    Report Status PENDING  Incomplete   Organism ID, Bacteria KLEBSIELLA PNEUMONIAE  Final   Organism ID, Bacteria ESCHERICHIA COLI  Final      Susceptibility   Escherichia coli - MIC*    AMPICILLIN 8 SENSITIVE Sensitive     CEFAZOLIN  (NON-URINE) <=1 SENSITIVE Sensitive     CEFEPIME  <=0.12 SENSITIVE Sensitive     ERTAPENEM <=0.12 SENSITIVE Sensitive     CEFTRIAXONE <=0.25 SENSITIVE Sensitive     CIPROFLOXACIN  <=0.06 SENSITIVE Sensitive     GENTAMICIN <=1 SENSITIVE Sensitive     MEROPENEM <=0.25 SENSITIVE Sensitive     TRIMETH/SULFA <=20 SENSITIVE Sensitive     AMPICILLIN/SULBACTAM <=2 SENSITIVE Sensitive     PIP/TAZO Value in next row Sensitive      <=4 SENSITIVEThis is a modified FDA-approved test that has been validated and its performance characteristics determined by the reporting laboratory.  This laboratory is certified under the Clinical Laboratory Improvement Amendments CLIA as qualified to perform high complexity clinical laboratory testing.    * RARE ESCHERICHIA COLI   Klebsiella pneumoniae - MIC*    AMPICILLIN Value in next  row Resistant      <=4 SENSITIVEThis is a modified FDA-approved test that has been validated and its performance characteristics determined by the reporting laboratory.  This laboratory is certified under the Clinical Laboratory Improvement Amendments CLIA as qualified to perform high complexity clinical laboratory testing.    CEFAZOLIN  (NON-URINE)  Value in next row Sensitive      <=4 SENSITIVEThis is a modified FDA-approved test that has been validated and its performance characteristics determined by the reporting laboratory.  This laboratory is certified under the Clinical Laboratory Improvement Amendments CLIA as qualified to perform high complexity clinical laboratory testing.    CEFEPIME  Value in next row Sensitive      <=4 SENSITIVEThis is a modified FDA-approved test that has been validated and its performance characteristics determined by the reporting laboratory.  This laboratory is certified under the Clinical Laboratory Improvement Amendments CLIA as qualified to perform high complexity clinical laboratory testing.    ERTAPENEM Value in next row Sensitive      <=4 SENSITIVEThis is a modified FDA-approved test that has been validated and its performance characteristics determined by the reporting laboratory.  This laboratory is certified under the Clinical Laboratory Improvement Amendments CLIA as qualified to perform high complexity clinical laboratory testing.    CEFTRIAXONE Value in next row Sensitive      <=4 SENSITIVEThis is a modified FDA-approved test that has been validated and its performance characteristics determined by the reporting laboratory.  This laboratory is certified under the Clinical Laboratory Improvement Amendments CLIA as qualified to perform high complexity clinical laboratory testing.    CIPROFLOXACIN  Value in next row Sensitive      <=4 SENSITIVEThis is a modified FDA-approved test that has been validated and its performance characteristics determined by the reporting  laboratory.  This laboratory is certified under the Clinical Laboratory Improvement Amendments CLIA as qualified to perform high complexity clinical laboratory testing.    GENTAMICIN Value in next row Sensitive      <=4 SENSITIVEThis is a modified FDA-approved test that has been validated and its performance characteristics determined by the reporting laboratory.  This laboratory is certified under the Clinical Laboratory Improvement Amendments CLIA as qualified to perform high complexity clinical laboratory testing.    MEROPENEM Value in next row Sensitive      <=4 SENSITIVEThis is a modified FDA-approved test that has been validated and its performance characteristics determined by the reporting laboratory.  This laboratory is certified under the Clinical Laboratory Improvement Amendments CLIA as qualified to perform high complexity clinical laboratory testing.    TRIMETH/SULFA Value in next row Sensitive      <=4 SENSITIVEThis is a modified FDA-approved test that has been validated and its performance characteristics determined by the reporting laboratory.  This laboratory is certified under the Clinical Laboratory Improvement Amendments CLIA as qualified to perform high complexity clinical laboratory testing.    AMPICILLIN/SULBACTAM Value in next row Sensitive      <=4 SENSITIVEThis is a modified FDA-approved test that has been validated and its performance characteristics determined by the reporting laboratory.  This laboratory is certified under the Clinical Laboratory Improvement Amendments CLIA as qualified to perform high complexity clinical laboratory testing.    PIP/TAZO Value in next row Sensitive      <=4 SENSITIVEThis is a modified FDA-approved test that has been validated and its performance characteristics determined by the reporting laboratory.  This laboratory is certified under the Clinical Laboratory Improvement Amendments CLIA as qualified to perform high complexity clinical laboratory  testing.    * FEW KLEBSIELLA PNEUMONIAE    Studies/Results: No results found.    Assessment/Plan:  INTERVAL HISTORY:  E coli, Klebsiella, streptococcus intermedius and eikenella growing on culture   Principal Problem:   Intra-abdominal infection Active Problems:   Hypertension   Status post ileostomy (HCC)  History of hernia repair   CKD stage 3a, GFR 45-59 ml/min (HCC)   Generalized weakness   Anemia   Polymicrobial bacterial infection   Hx of colon cancer, stage I    Donna [name] is a 78 y.o. female with past medical history significant for ulcerative colitis, colon cancer status post curative partial colectomy ileostomy creation who had an incarcerated abdominal wall hernia status post surgery repair October 1725 now admitted with an abscess around the ileostomy and also in proximity to her mesh.  She is status post IR guided drainage of pus from this area with cultures yielding  E coli, Klebsiella, streptococcus intermedius and eikenella growing on culture   I agree with Dr. Sheldon that she should have a repeat CT scan to ensure that it her abscesses are resolving as well as drain study  I also agree about prolonged antibiotics in particular if she does not have mesh removal sometime in the near future.  I am hoping that the organisms isolated from her abdomen will be sensitive to oral antibiotics Zosyn  will be quite fine covering everything that has been isolated so far.  We are hoping that the strep intermedius is S to PCN and that would allow us  to narrow to augmentin    Donna [name] has an appointment on 01/06/2023 at 1PM with Dr. Dennise  at  [facility], which  is located in the [facility] at  [address] in [place].  [address], which is located to the left of the elevators.  Phone: [phone]  Fax: [phone]  [email or web address]  The patient should arrive 30  minutes prior to their appoitment.   I personally spent a total of 50 minutes in the care of the patient today including preparing to see the patient, getting/reviewing separately obtained history, performing a medically appropriate exam/evaluation, counseling and educating, placing orders, referring and communicating with other health care professionals, documenting clinical information in the EHR, independently interpreting results, and communicating results.  Evaluation of the patient requires complex antimicrobial therapy evaluation, counseling , isolation needs to reduce disease transmission and risk assessment and mitigation.     LOS: 7 days   Donna [name] 12/10/2023, 4:13 PM

## 2023-12-10 NOTE — Plan of Care (Signed)

## 2023-12-10 NOTE — Progress Notes (Signed)
 Physical Therapy Treatment Patient Details Name: Donna [name] MRN: [id number] DOB: [birth year] Today's Date: 12/10/2023   History of Present Illness Pt is a 78 y/o F admitted on 12/03/23 after presenting with c/o pain & drainage around abdominal incision site. Pt admitted for intra-abdominal infection. PMH: ulcerative colitis, colon CA s/p colectomy with ileostomy, CKD 3A, GERD, DDD, OSA, incarcerated abdominal wall hernia s/p surgery/repair on 10/18/23    PT Comments  The patient  received in bathroom, taking care of ostomy. Patient ambulated x 350' using a Rw and supervision/ CGA with  multiple stand and rest breaks, reporting that she gets discomfort and pain at ostomy site and the break eases discomfort. Also is noted with mild SOB.  Patient reports that she had pain medication.  Continue PT for mobility, plans are DC home with HHPT, support by son.    If plan is discharge home, recommend the following: A little help with walking and/or transfers;A little help with bathing/dressing/bathroom;Assistance with cooking/housework;Assist for transportation;Help with stairs or ramp for entrance   Can travel by private vehicle        Equipment Recommendations  None recommended by PT    Recommendations for Other Services       Precautions / Restrictions Precautions Precautions: Fall Precaution/Restrictions Comments: Right ileostomy Restrictions Weight Bearing Restrictions Per Provider Order: No     Mobility  Bed Mobility               General bed mobility comments: in BR    Transfers   Equipment used: Straight cane, Rolling walker (2 wheels)   Sit to Stand: Supervision           General transfer comment: patient ambulating in room with [facility]    Ambulation/Gait Ambulation/Gait assistance: Supervision Gait Distance (Feet): 350 Feet (10' with SPC) Assistive device: Rolling walker (2 wheels) Gait Pattern/deviations: Decreased step length - right, Step-through  pattern, Decreased step length - left, Decreased stride length Gait velocity: decreased     General Gait Details: Pt ambulated from BR with SPC.  SABRA Pt using RW to ambulate into hallway with supervision<>CGA, 4 standing standing rest break 2/2 fatigue Dyspnea 2-3/4   Stairs             Wheelchair Mobility     Tilt Bed    Modified Rankin (Stroke Patients Only)       Balance Overall balance assessment: Needs assistance Sitting-balance support: Feet supported Sitting balance-Leahy Scale: Good Sitting balance - Comments: sitting on toilet without LOB   Standing balance support: No upper extremity supported, During functional activity Standing balance-Leahy Scale: Good Standing balance comment: emptying colostomy                            Communication Communication Communication: No apparent difficulties  Cognition Arousal: Alert Behavior During Therapy: WFL for tasks assessed/performed   PT - Cognitive impairments: No apparent impairments                         Following commands: Intact      Cueing    Exercises      General Comments        Pertinent Vitals/Pain Pain Assessment Pain Assessment: 0-10 Pain Score: 6  Pain Location: ileostomy site Pain Descriptors / Indicators: Grimacing, Discomfort Pain Intervention(s): Premedicated before session, Monitored during session    Home Living  Prior Function            PT Goals (current goals can now be found in the care plan section) Progress towards PT goals: Progressing toward goals    Frequency    Min 2X/week      PT Plan      Co-evaluation              AM-PAC PT 6 Clicks Mobility   Outcome Measure  Help needed turning from your back to your side while in a flat bed without using bedrails?: None Help needed moving from lying on your back to sitting on the side of a flat bed without using bedrails?: None Help needed  moving to and from a bed to a chair (including a wheelchair)?: None Help needed standing up from a chair using your arms (e.g., wheelchair or bedside chair)?: A Little Help needed to walk in hospital room?: A Little Help needed climbing 3-5 steps with a railing? : A Little 6 Click Score: 21    End of Session   Activity Tolerance: Patient tolerated treatment well Patient left: in chair;with call bell/phone within reach;with family/visitor present Nurse Communication: Mobility status PT Visit Diagnosis: Muscle weakness (generalized) (M62.81);Unsteadiness on feet (R26.81)     Time: 8843-8781 PT Time Calculation (min) (ACUTE ONLY): 22 min  Charges:    $Gait Training: 8-22 mins PT General Charges $$ ACUTE PT VISIT: 1 Visit                     Darice Potters PT Acute Rehabilitation Services Office [phone]    Potters Darice Norris 12/10/2023, 12:45 PM

## 2023-12-10 NOTE — TOC Progression Note (Signed)
 Transition of Care [facility]) - Progression Note    Patient Details  Name: Donna [name] MRN: [id number] Date of Birth: 03-12-1945  [facility] CM/SW Contact  Heather DELENA Saltness, LCSW Phone Number: 12/10/2023, 1:39 PM  Clinical Narrative:    CSW met with pt at bedside to discuss PT's recommendation for [facility] PT services upon discharge. Pt agreeable with recommendation. Pt reports she is active with [facility] PT services. CSW confirmed with Cindie that pt is active with [facility] for [facility] PT/RN services. Will resume services upon discharge. Pt reports she owns RW, Shower chair, raised toilet seat, and denies any DME needs. Pt reports her son will transport her home upon discharge. TOC will continue to follow.   Expected Discharge Plan: Home w Home Health Services Barriers to Discharge: Continued Medical Work up   Expected Discharge Plan and Services   Discharge Planning Services: CM Consult   Living arrangements for the past 2 months: Single Family Home                 Social Drivers of Health (SDOH) Interventions SDOH Screenings   Food Insecurity: No Food Insecurity (12/05/2023)  Recent Concern: Food Insecurity - Food Insecurity Present (12/04/2023)  Housing: Low Risk  (12/05/2023)  Transportation Needs: No Transportation Needs (12/05/2023)  Utilities: Not At Risk (12/05/2023)  Depression (PHQ2-9): Low Risk  (11/03/2021)  Social Connections: Moderately Integrated (12/04/2023)  Tobacco Use: Low Risk  (12/03/2023)    Readmission Risk Interventions    12/05/2023    5:13 PM 10/22/2023    2:13 PM  Readmission Risk Prevention Plan  Transportation Screening Complete Complete  PCP or Specialist Appt within 5-7 Days  Complete  PCP or Specialist Appt within 3-5 Days Complete   Home Care Screening  Complete  Medication Review (RN CM)  Complete  HRI or Home Care Consult Complete   Social Work Consult for Recovery Care Planning/Counseling Complete   Palliative Care Screening Not  Applicable   Medication Review Oceanographer) Complete     Signed: Heather Saltness, MSW, LCSW Clinical Social Worker Inpatient Care Management 12/10/2023 1:42 PM

## 2023-12-11 ENCOUNTER — Other Ambulatory Visit: Payer: Self-pay

## 2023-12-11 ENCOUNTER — Other Ambulatory Visit (HOSPITAL_COMMUNITY): Payer: Self-pay

## 2023-12-11 DIAGNOSIS — Z932 Ileostomy status: Secondary | ICD-10-CM | POA: Diagnosis not present

## 2023-12-11 DIAGNOSIS — B961 Klebsiella pneumoniae [K. pneumoniae] as the cause of diseases classified elsewhere: Secondary | ICD-10-CM | POA: Diagnosis not present

## 2023-12-11 DIAGNOSIS — B954 Other streptococcus as the cause of diseases classified elsewhere: Secondary | ICD-10-CM | POA: Diagnosis not present

## 2023-12-11 DIAGNOSIS — B9689 Other specified bacterial agents as the cause of diseases classified elsewhere: Secondary | ICD-10-CM

## 2023-12-11 DIAGNOSIS — B9620 Unspecified Escherichia coli [E. coli] as the cause of diseases classified elsewhere: Secondary | ICD-10-CM | POA: Diagnosis not present

## 2023-12-11 LAB — CBC WITH DIFFERENTIAL/PLATELET
Abs Immature Granulocytes: 0.03 K/uL (ref 0.00–0.07)
Basophils Absolute: 0.1 K/uL (ref 0.0–0.1)
Basophils Relative: 1 %
Eosinophils Absolute: 0.3 K/uL (ref 0.0–0.5)
Eosinophils Relative: 5 %
HCT: 30.8 % — ABNORMAL LOW (ref 36.0–46.0)
Hemoglobin: 10.5 g/dL — ABNORMAL LOW (ref 12.0–15.0)
Immature Granulocytes: 1 %
Lymphocytes Relative: 17 %
Lymphs Abs: 1.1 K/uL (ref 0.7–4.0)
MCH: 29.7 pg (ref 26.0–34.0)
MCHC: 34.1 g/dL (ref 30.0–36.0)
MCV: 87 fL (ref 80.0–100.0)
Monocytes Absolute: 0.6 K/uL (ref 0.1–1.0)
Monocytes Relative: 10 %
Neutro Abs: 4.2 K/uL (ref 1.7–7.7)
Neutrophils Relative %: 66 %
Platelets: 352 K/uL (ref 150–400)
RBC: 3.54 MIL/uL — ABNORMAL LOW (ref 3.87–5.11)
RDW: 17.8 % — ABNORMAL HIGH (ref 11.5–15.5)
WBC: 6.4 K/uL (ref 4.0–10.5)
nRBC: 0 % (ref 0.0–0.2)

## 2023-12-11 LAB — RENAL FUNCTION PANEL
Albumin: 3.4 g/dL — ABNORMAL LOW (ref 3.5–5.0)
Anion gap: 11 (ref 5–15)
BUN: 18 mg/dL (ref 8–23)
CO2: 23 mmol/L (ref 22–32)
Calcium: 9.4 mg/dL (ref 8.9–10.3)
Chloride: 109 mmol/L (ref 98–111)
Creatinine, Ser: 1.01 mg/dL — ABNORMAL HIGH (ref 0.44–1.00)
GFR, Estimated: 57 mL/min — ABNORMAL LOW (ref >60)
Glucose, Bld: 117 mg/dL — ABNORMAL HIGH (ref 70–99)
Phosphorus: 2.4 mg/dL — ABNORMAL LOW (ref 2.5–4.6)
Potassium: 3.9 mmol/L (ref 3.5–5.1)
Sodium: 143 mmol/L (ref 135–145)

## 2023-12-11 LAB — AEROBIC/ANAEROBIC CULTURE W GRAM STAIN (SURGICAL/DEEP WOUND)

## 2023-12-11 LAB — MAGNESIUM: Magnesium: 1.8 mg/dL (ref 1.7–2.4)

## 2023-12-11 MED ORDER — AMOXICILLIN-POT CLAVULANATE 875-125 MG PO TABS
1.0000 | ORAL_TABLET | Freq: Two times a day (BID) | ORAL | Status: DC
  Administered 2023-12-11: 1 via ORAL
  Filled 2023-12-11: qty 1

## 2023-12-11 MED ORDER — ENSURE MAX PROTEIN PO LIQD
11.0000 [oz_av] | Freq: Two times a day (BID) | ORAL | 0 refills | Status: AC
  Filled 2023-12-11: qty 3330, 5d supply, fill #0

## 2023-12-11 MED ORDER — AMOXICILLIN-POT CLAVULANATE 875-125 MG PO TABS
1.0000 | ORAL_TABLET | Freq: Two times a day (BID) | ORAL | 6 refills | Status: AC
  Filled 2023-12-11: qty 60, 30d supply, fill #0
  Filled 2024-01-20: qty 60, 30d supply, fill #1

## 2023-12-11 MED ORDER — MAGNESIUM SULFATE 2 GM/50ML IV SOLN
2.0000 g | Freq: Once | INTRAVENOUS | Status: AC
  Administered 2023-12-11: 2 g via INTRAVENOUS
  Filled 2023-12-11: qty 50

## 2023-12-11 MED ORDER — SODIUM CHLORIDE 0.9% FLUSH
10.0000 mL | Freq: Two times a day (BID) | INTRAVENOUS | 0 refills | Status: AC
  Filled 2023-12-11: qty 120, 2d supply, fill #0

## 2023-12-11 MED ORDER — ONDANSETRON HCL 4 MG PO TABS
4.0000 mg | ORAL_TABLET | Freq: Four times a day (QID) | ORAL | 0 refills | Status: AC | PRN
  Filled 2023-12-11 (×2): qty 20, 5d supply, fill #0

## 2023-12-11 NOTE — Progress Notes (Signed)
 Discharge medications delivered to patient at the bedside.

## 2023-12-11 NOTE — Progress Notes (Incomplete)
 PROGRESS NOTE    Donna [name]  FMW:[id number] DOB: [birth year] DOA: 12/03/2023 PCP: Clarice Nottingham, MD   Brief Narrative:  Donna [name] is a 78 y.o. female with medical history significant for ulcerative colitis, colon cancer s/p colectomy with ileostomy, CKD-3A, GERD, DDD, OSA, incarcerated abdominal wall hernia s/p surgery/repair on 10/18/23 who presented to the ED for evaluation of pain and drainage around her abdominal incision site and admitted for intra-abdominal infection.  Patient placed empirically on IV antibiotics.  General surgery consulted.  IR also consulted for drain placement per general surgery.    Assessment and Plan:  intra-abdominal infection/history of incarcerated abdominal wall hernia s/p repair -Patient noted with multiple hospitalizations over the past month for complications related to recent hernia repair. - Patient presented with diffuse abdominal pain x 3 to 4 days prior to admission with increased drainage and pain around surgical site. - CT abdomen and pelvis with multiple loculated fluid collections surrounding the ileostomy with increasing punctuate gas, suggestive of superinfection and/or enteric communication and relative hyperdensity suggestive of hemorrhagic fluid collections that have enlarged since prior examination. - Patient seen in consultation by general surgery who has asked IR to place drains in collections for now and cultures to be obtained. - It is noted per general surgery the last fluid collection aspiration of the abdominal fluid collection 11/8 was consistent with seroma and cultures negative. -Patient seen by IR and patient status post 12 French drain placement and left mid abdominal region with evacuation of 250 cc of purulent fluid, 12/04/2023. -General surgery feels patient will need a repeat scan to ensure abscesses are resolving as well as drain study in about 2 weeks from drain placement. - Cultures from abscess with few  Klebsiella pneumonia, Streptococcus intermedius, eikenella corrodens.  - Continue empiric IV Zosyn . -Patient seen in consultation by ID who are recommending continuation of IV Zosyn  for now, follow-up abscess cultures and feel patient would likely need protracted antibiotics with close follow-up with ID, general surgery and interventional radiology. -Per IR patient will be scheduled for follow-up IR drain clinic next week with CT and drain injection. - Per general surgery.     CKD 3A -Creatinine noted at 1.32 which is not significantly different from recent baseline but slightly worse than 0.99 3 weeks ago. - Renal function improved with hydration and creatinine currently at 0.93. - Follow.   Essential Hypertension  - Controlled on Norvasc  10 mg daily, Lotensin  40 mg daily, labetalol  100 mg twice daily.    4.  Colon cancer status post colectomy with ileostomy -Patient noted with good output from ileostomy bag. -Ileostomy bag noted to have burst x 2 per patient on 12/09/2023. -Wound care RN instructed patient's floor RN on management of ileostomy bag.. - Continue FiberCon, loperamide .   5.  GERD - Continue Protonix , Pepcid .     6.  Chronic leg swelling -Patient noted on admission with 1+ bilateral lower extremity edema on exam unchanged. - TED hose.   7.  Generalized weakness -In the setting of acute illness. - PT/OT following and recommending home health.   Normocytic Anemia - Likely anemia of chronic disease and possible hematoma noted on CT abdomen and pelvis.. - Patient with no overt bleeding. - Anemia panel from 11/08/2023 with iron  level of 15, ferritin of 467, folate of > 20.  Vitamin B12 1740. -Hemoglobin noted at 6.7 on 12/05/2023.  - Status post transfusion 2 months PRBCs with hemoglobin currently at 10.7 this morning.  - Follow H&H. -  Transfusion threshold hemoglobin < 7.   9.  Hypophosphatemia/hypomagnesemia/hypokalemia - Magnesium  1.8.  - Phosphorus at 2.5. -  Potassium of 4.0.   - Continue oral K-Phos 250 mg p.o. twice daily x 3 days.    - Repeat labs in AM.    Hypoalbuminemia: Patient's Albumin Trend: Recent Labs  Lab 12/03/23 1646 12/07/23 1121 12/08/23 0606 12/09/23 0531 12/10/23 0601 12/11/23 0648  ALBUMIN 3.2* 3.2* 3.3* 3.2* 3.4* 3.4*  -CTM & Trend & repeat CMP in the AM  Overweight: Complicates overall prognosis and care. Estimated body mass index is 29.41 kg/m as calculated from the following:   Height as of this encounter: 5' 3 (1.6 m).   Weight as of this encounter: 75.3 kg. Weight Loss and Dietary Counseling given      DVT prophylaxis: Place and maintain sequential compression device Start: 12/05/23 0808 Place TED hose Start: 12/04/23 0259    Code Status: Full Code Family Communication:   Disposition Plan:  Level of care: Med-Surg Status is: Inpatient {Inpatient:23812}    Consultants:  ***  Procedures:  ***  Antimicrobials:  Anti-infectives (From admission, onward)    Start     Dose/Rate Route Frequency Ordered Stop   12/03/23 2300  piperacillin -tazobactam (ZOSYN ) IVPB 3.375 g        3.375 g 12.5 mL/hr over 240 Minutes Intravenous Every 8 hours 12/03/23 2247          Subjective: ***  Objective: Vitals:   12/10/23 1934 12/10/23 2235 12/10/23 2240 12/11/23 0526  BP: (!) 142/64 136/60 136/60 (!) 132/52  Pulse: 81 79  78  Resp: 16   16  Temp: 98.7 F (37.1 C)   98.6 F (37 C)  TempSrc: Oral   Oral  SpO2: 100%   100%  Weight:      Height:        Intake/Output Summary (Last 24 hours) at 12/11/2023 0824 Last data filed at 12/11/2023 9360 Gross per 24 hour  Intake 260 ml  Output 325 ml  Net -65 ml   Filed Weights   12/03/23 1640  Weight: 75.3 kg    Examination: Physical Exam:  Constitutional: WN/WD, NAD and appears calm and comfortable Eyes: PERRL, lids and conjunctivae normal, sclerae anicteric  ENMT: External Ears, Nose appear normal. Grossly normal hearing. Mucous membranes are  moist. Posterior pharynx clear of any exudate or lesions. Normal dentition.  Neck: Appears normal, supple, no cervical masses, normal ROM, no appreciable thyromegaly Respiratory: Clear to auscultation bilaterally, no wheezing, rales, rhonchi or crackles. Normal respiratory effort and patient is not tachypenic. No accessory muscle use.  Cardiovascular: RRR, no murmurs / rubs / gallops. S1 and S2 auscultated. No extremity edema. 2+ pedal pulses. No carotid bruits.  Abdomen: Soft, non-tender, non-distended. No masses palpated. No appreciable hepatosplenomegaly. Bowel sounds positive.  GU: Deferred. Musculoskeletal: No clubbing / cyanosis of digits/nails. No joint deformity upper and lower extremities. Good ROM, no contractures. Normal strength and muscle tone.  Skin: No rashes, lesions, ulcers. No induration; Warm and dry.  Neurologic: CN 2-12 grossly intact with no focal deficits. Sensation intact in all 4 Extremities, DTR normal. Strength 5/5 in all 4. Romberg sign cerebellar reflexes not assessed.  Psychiatric: Normal judgment and insight. Alert and oriented x 3. Normal mood and appropriate affect.   Data Reviewed: I have personally reviewed following labs and imaging studies  CBC: Recent Labs  Lab 12/06/23 0321 12/07/23 1121 12/08/23 0606 12/09/23 0531 12/10/23 0601 12/11/23 0648  WBC 8.8 8.7 8.9 7.2 6.7  6.4  NEUTROABS 6.1  --  6.5 4.8 4.6 4.2  HGB 9.4* 10.2* 10.3* 9.8* 10.7* 10.5*  HCT 26.9* 29.5* 31.1* 30.1* 32.2* 30.8*  MCV 84.3 86.5 87.1 89.6 87.7 87.0  PLT 352 377 362 348 372 352   Basic Metabolic Panel: Recent Labs  Lab 12/06/23 0321 12/07/23 1121 12/08/23 0606 12/09/23 0531 12/10/23 0601 12/11/23 0648  NA 143 140 142 142 141 143  K 4.3 4.8 4.6 4.1 4.0 3.9  CL 108 107 107 108 108 109  CO2 25 25 25 24  21* 23  GLUCOSE 93 112* 103* 103* 116* 117*  BUN 11 10 13 13 15 18   CREATININE 0.91 0.93 0.91 0.93 0.93 1.01*  CALCIUM  8.6* 9.2 9.8 9.3 9.7 9.4  MG 2.1 1.9 1.9 1.8   --  1.8  PHOS 2.8 1.7* 2.4* 2.0* 2.5 2.4*   GFR: Estimated Creatinine Clearance: 46.1 mL/min (A) (by C-G formula based on SCr of 1.01 mg/dL (H)). Liver Function Tests: Recent Labs  Lab 12/07/23 1121 12/08/23 0606 12/09/23 0531 12/10/23 0601 12/11/23 0648  ALBUMIN 3.2* 3.3* 3.2* 3.4* 3.4*   No results for input(s): LIPASE, AMYLASE in the last 168 hours. No results for input(s): AMMONIA in the last 168 hours. Coagulation Profile: Recent Labs  Lab 12/04/23 1059  INR 1.0   Cardiac Enzymes: No results for input(s): CKTOTAL, CKMB, CKMBINDEX, TROPONINI in the last 168 hours. BNP (last 3 results) No results for input(s): PROBNP in the last 8760 hours. HbA1C: No results for input(s): HGBA1C in the last 72 hours. CBG: No results for input(s): GLUCAP in the last 168 hours. Lipid Profile: No results for input(s): CHOL, HDL, LDLCALC, TRIG, CHOLHDL, LDLDIRECT in the last 72 hours. Thyroid  Function Tests: No results for input(s): TSH, T4TOTAL, FREET4, T3FREE, THYROIDAB in the last 72 hours. Anemia Panel: No results for input(s): VITAMINB12, FOLATE, FERRITIN, TIBC, IRON , RETICCTPCT in the last 72 hours. Sepsis Labs: No results for input(s): PROCALCITON, LATICACIDVEN in the last 168 hours.  Recent Results (from the past 240 hours)  Aerobic/Anaerobic Culture w Gram Stain (surgical/deep wound)     Status: None   Collection Time: 12/04/23  1:50 PM   Specimen: Abscess  Result Value Ref Range Status   Specimen Description   Final    ABSCESS Performed at [facility], [address] [place], [place] [address]    Special Requests   Final    NONE Performed at [facility], [address] [place], [place] [address]    Gram Stain   Final    ABUNDANT WBC PRESENT, PREDOMINANTLY PMN ABUNDANT GRAM POSITIVE COCCI IN PAIRS IN CHAINS    Culture   Final    FEW KLEBSIELLA PNEUMONIAE RARE  ESCHERICHIA COLI ABUNDANT STREPTOCOCCUS INTERMEDIUS ABUNDANT EIKENELLA CORRODENS Usually susceptible to penicillin and other beta lactam agents,quinolones,macrolides and tetracyclines. NO ANAEROBES ISOLATED Performed at [facility], [address], [place], [place] [address]    Report Status 12/11/2023 FINAL  Final   Organism ID, Bacteria KLEBSIELLA PNEUMONIAE  Final   Organism ID, Bacteria ESCHERICHIA COLI  Final   Organism ID, Bacteria STREPTOCOCCUS INTERMEDIUS  Final      Susceptibility   Escherichia coli - MIC*    AMPICILLIN 8 SENSITIVE Sensitive     CEFAZOLIN  (NON-URINE) <=1 SENSITIVE Sensitive     CEFEPIME  <=0.12 SENSITIVE Sensitive     ERTAPENEM <=0.12 SENSITIVE Sensitive     CEFTRIAXONE <=0.25 SENSITIVE Sensitive     CIPROFLOXACIN  <=0.06 SENSITIVE Sensitive  GENTAMICIN <=1 SENSITIVE Sensitive     MEROPENEM <=0.25 SENSITIVE Sensitive     TRIMETH/SULFA <=20 SENSITIVE Sensitive     AMPICILLIN/SULBACTAM <=2 SENSITIVE Sensitive     PIP/TAZO Value in next row Sensitive      <=4 SENSITIVEThis is a modified FDA-approved test that has been validated and its performance characteristics determined by the reporting laboratory.  This laboratory is certified under the Clinical Laboratory Improvement Amendments CLIA as qualified to perform high complexity clinical laboratory testing.    * RARE ESCHERICHIA COLI   Klebsiella pneumoniae - MIC*    AMPICILLIN Value in next row Resistant      <=4 SENSITIVEThis is a modified FDA-approved test that has been validated and its performance characteristics determined by the reporting laboratory.  This laboratory is certified under the Clinical Laboratory Improvement Amendments CLIA as qualified to perform high complexity clinical laboratory testing.    CEFAZOLIN  (NON-URINE) Value in next row Sensitive      <=4 SENSITIVEThis is a modified FDA-approved test that has been validated and its performance characteristics determined by the reporting  laboratory.  This laboratory is certified under the Clinical Laboratory Improvement Amendments CLIA as qualified to perform high complexity clinical laboratory testing.    CEFEPIME  Value in next row Sensitive      <=4 SENSITIVEThis is a modified FDA-approved test that has been validated and its performance characteristics determined by the reporting laboratory.  This laboratory is certified under the Clinical Laboratory Improvement Amendments CLIA as qualified to perform high complexity clinical laboratory testing.    ERTAPENEM Value in next row Sensitive      <=4 SENSITIVEThis is a modified FDA-approved test that has been validated and its performance characteristics determined by the reporting laboratory.  This laboratory is certified under the Clinical Laboratory Improvement Amendments CLIA as qualified to perform high complexity clinical laboratory testing.    CEFTRIAXONE Value in next row Sensitive      <=4 SENSITIVEThis is a modified FDA-approved test that has been validated and its performance characteristics determined by the reporting laboratory.  This laboratory is certified under the Clinical Laboratory Improvement Amendments CLIA as qualified to perform high complexity clinical laboratory testing.    CIPROFLOXACIN  Value in next row Sensitive      <=4 SENSITIVEThis is a modified FDA-approved test that has been validated and its performance characteristics determined by the reporting laboratory.  This laboratory is certified under the Clinical Laboratory Improvement Amendments CLIA as qualified to perform high complexity clinical laboratory testing.    GENTAMICIN Value in next row Sensitive      <=4 SENSITIVEThis is a modified FDA-approved test that has been validated and its performance characteristics determined by the reporting laboratory.  This laboratory is certified under the Clinical Laboratory Improvement Amendments CLIA as qualified to perform high complexity clinical laboratory testing.     MEROPENEM Value in next row Sensitive      <=4 SENSITIVEThis is a modified FDA-approved test that has been validated and its performance characteristics determined by the reporting laboratory.  This laboratory is certified under the Clinical Laboratory Improvement Amendments CLIA as qualified to perform high complexity clinical laboratory testing.    TRIMETH/SULFA Value in next row Sensitive      <=4 SENSITIVEThis is a modified FDA-approved test that has been validated and its performance characteristics determined by the reporting laboratory.  This laboratory is certified under the Clinical Laboratory Improvement Amendments CLIA as qualified to perform high complexity clinical laboratory testing.    AMPICILLIN/SULBACTAM Value  in next row Sensitive      <=4 SENSITIVEThis is a modified FDA-approved test that has been validated and its performance characteristics determined by the reporting laboratory.  This laboratory is certified under the Clinical Laboratory Improvement Amendments CLIA as qualified to perform high complexity clinical laboratory testing.    PIP/TAZO Value in next row Sensitive      <=4 SENSITIVEThis is a modified FDA-approved test that has been validated and its performance characteristics determined by the reporting laboratory.  This laboratory is certified under the Clinical Laboratory Improvement Amendments CLIA as qualified to perform high complexity clinical laboratory testing.    * FEW KLEBSIELLA PNEUMONIAE   Streptococcus intermedius - MIC*    PENICILLIN Value in next row Sensitive      <=4 SENSITIVEThis is a modified FDA-approved test that has been validated and its performance characteristics determined by the reporting laboratory.  This laboratory is certified under the Clinical Laboratory Improvement Amendments CLIA as qualified to perform high complexity clinical laboratory testing.    CEFTRIAXONE Value in next row Sensitive      <=4 SENSITIVEThis is a modified  FDA-approved test that has been validated and its performance characteristics determined by the reporting laboratory.  This laboratory is certified under the Clinical Laboratory Improvement Amendments CLIA as qualified to perform high complexity clinical laboratory testing.    LEVOFLOXACIN Value in next row Sensitive      <=4 SENSITIVEThis is a modified FDA-approved test that has been validated and its performance characteristics determined by the reporting laboratory.  This laboratory is certified under the Clinical Laboratory Improvement Amendments CLIA as qualified to perform high complexity clinical laboratory testing.    VANCOMYCIN Value in next row Sensitive      <=4 SENSITIVEThis is a modified FDA-approved test that has been validated and its performance characteristics determined by the reporting laboratory.  This laboratory is certified under the Clinical Laboratory Improvement Amendments CLIA as qualified to perform high complexity clinical laboratory testing.    * ABUNDANT STREPTOCOCCUS INTERMEDIUS     Radiology Studies: No results found.   Scheduled Meds:  sodium chloride    Intravenous Once   acetaminophen   500 mg Oral TID WC & HS   amLODipine   10 mg Oral QHS   aspirin  EC  81 mg Oral Daily   benazepril   40 mg Oral QHS   calcium  carbonate  2 tablet Oral BID WC   famotidine   20 mg Oral Daily   labetalol   100 mg Oral BID   latanoprost   1 drop Both Eyes QHS   lidocaine   15 mL Oral Once   loperamide   2 mg Oral TID   multivitamins with iron   1 tablet Oral Daily   pantoprazole   40 mg Oral BID AC   phosphorus  250 mg Oral BID   polycarbophil  625 mg Oral BID   Ensure Max Protein  11 oz Oral BID   sodium chloride  flush  10-40 mL Intracatheter Q12H   sodium chloride  flush  5 mL Intracatheter Q8H   timolol   1 drop Both Eyes Daily   Continuous Infusions:  magnesium  sulfate bolus IVPB     piperacillin -tazobactam (ZOSYN )  IV 3.375 g (12/11/23 0641)     LOS: 8 days    Time  spent: *** minutes spent on chart review, discussion with nursing staff, consultants, updating family and interview/physical exam; more than 50% of that time was spent in counseling and/or coordination of care.   Alejandro Lazarus Marker, DO [facility] Available  via Epic secure chat 7am-7pm After these hours, please refer to coverage provider listed on [email or web address] 12/11/2023, 8:24 AM

## 2023-12-11 NOTE — Plan of Care (Signed)

## 2023-12-11 NOTE — Discharge Summary (Incomplete)
 Physician Discharge Summary   Patient: Donna [name] MRN: [id number] DOB: 11/13/1945  Admit date:     12/03/2023  Discharge date: 12/11/23  Discharge Physician: Alejandro Marker, DO   PCP: Clarice Nottingham, MD   Recommendations at discharge:   Follow-up with PCP within 1 to 2 weeks repeat CBC, CMP, mag, Phos within 1 week Follow-up with general surgery in outpatient setting within 1 to 2 weeks Follow-up with IR for drain follow-up within next week Follow-up with infectious diseases Dr. Dennise in the outpatient setting and continue Augmentin  long-term  Discharge Diagnoses: Principal Problem:   Intra-abdominal infection Active Problems:   Hypertension   Status post ileostomy (HCC)   History of hernia repair   CKD stage 3a, GFR 45-59 ml/min (HCC)   Generalized weakness   Anemia   Polymicrobial bacterial infection   Hx of colon cancer, stage I   Infected hernioplasty mesh  Resolved Problems:   * No resolved hospital problems. *  Hospital Course: Donna [name] is a 78 y.o. female with medical history significant for ulcerative colitis, colon cancer s/p colectomy with ileostomy, CKD-3A, GERD, DDD, OSA, incarcerated abdominal wall hernia s/p surgery/repair on 10/18/23 who presented to the ED for evaluation of pain and drainage around her abdominal incision site and admitted for intra-abdominal infection.  Patient placed empirically on IV antibiotics.  General surgery consulted.  IR also consulted for drain placement per general surgery.    Assessment and Plan:  intra-abdominal infection/history of incarcerated abdominal wall hernia s/p repair -Patient noted with multiple hospitalizations over the past month for complications related to recent hernia repair. - Patient presented with diffuse abdominal pain x 3 to 4 days prior to admission with increased drainage and pain around surgical site. - CT abdomen and pelvis with multiple loculated fluid collections surrounding the  ileostomy with increasing punctuate gas, suggestive of superinfection and/or enteric communication and relative hyperdensity suggestive of hemorrhagic fluid collections that have enlarged since prior examination. - Patient seen in consultation by general surgery who has asked IR to place drains in collections for now and cultures to be obtained. - It is noted per general surgery the last fluid collection aspiration of the abdominal fluid collection 11/8 was consistent with seroma and cultures negative. -Patient seen by IR and patient status post 12 French drain placement and left mid abdominal region with evacuation of 250 cc of purulent fluid, 12/04/2023. -General surgery feels patient will need a repeat scan to ensure abscesses are resolving as well as drain study in about 2 weeks from drain placement. - Cultures from abscess with few Klebsiella pneumonia, Streptococcus intermedius, eikenella corrodens.  - Continue empiric IV Zosyn . -Patient seen in consultation by ID who are recommending continuation of IV Zosyn  for now, follow-up abscess cultures and feel patient would likely need protracted antibiotics with close follow-up with ID, general surgery and interventional radiology. -Per IR patient will be scheduled for follow-up IR drain clinic next week with CT and drain injection. - Per general surgery.     CKD 3A -Creatinine noted at 1.32 which is not significantly different from recent baseline but slightly worse than 0.99 3 weeks ago. - Renal function improved with hydration and creatinine currently at 0.93. - Follow.   Essential Hypertension  - Controlled on Norvasc  10 mg daily, Lotensin  40 mg daily, labetalol  100 mg twice daily.    4.  Colon cancer status post colectomy with ileostomy -Patient noted with good output from ileostomy bag. -Ileostomy bag noted to have burst  x 2 per patient on 12/09/2023. -Wound care RN instructed patient's floor RN on management of ileostomy bag.. - Continue  FiberCon, loperamide .   5.  GERD - Continue Protonix , Pepcid .     6.  Chronic leg swelling -Patient noted on admission with 1+ bilateral lower extremity edema on exam unchanged. - TED hose.   7.  Generalized weakness -In the setting of acute illness. - PT/OT following and recommending home health.   Normocytic Anemia - Likely anemia of chronic disease and possible hematoma noted on CT abdomen and pelvis.. - Patient with no overt bleeding. - Anemia panel from 11/08/2023 with iron  level of 15, ferritin of 467, folate of > 20.  Vitamin B12 1740. -Hemoglobin noted at 6.7 on 12/05/2023.  - Status post transfusion 2 months PRBCs with hemoglobin currently at 10.7 this morning.  - Follow H&H. - Transfusion threshold hemoglobin < 7.   9.  Hypophosphatemia/hypomagnesemia/hypokalemia - Magnesium  1.8.  - Phosphorus at 2.5. - Potassium of 4.0.   - Continue oral K-Phos 250 mg p.o. twice daily x 3 days.    - Repeat labs in AM.    Hypoalbuminemia: Patient's Albumin Trend: Recent Labs  Lab 12/03/23 1646 12/07/23 1121 12/08/23 0606 12/09/23 0531 12/10/23 0601 12/11/23 0648  ALBUMIN 3.2* 3.2* 3.3* 3.2* 3.4* 3.4*  -CTM & Trend & repeat CMP in the AM  Overweight: Complicates overall prognosis and care. Estimated body mass index is 29.41 kg/m as calculated from the following:   Height as of this encounter: 5' 3 (1.6 m).   Weight as of this encounter: 75.3 kg. Weight Loss and Dietary Counseling given  Consultants: General Surgery, IR, ID  Procedures performed: As  delineated as above  Disposition: Home health Diet recommendation:  Discharge Diet Orders (From admission, onward)     Start     Ordered   12/11/23 0000  Diet - low sodium heart healthy        12/11/23 1058           Cardiac diet DISCHARGE MEDICATION: Allergies as of 12/11/2023       Reactions   Ozempic (0.25 Or 0.5 Mg-dose) [semaglutide(0.25 Or 0.5mg -dos)] Nausea And Vomiting   Ezetimibe Other (See Comments)    Unknown   Gabapentin  Nausea And Vomiting   N/V and headaches   Iodinated Contrast Media Other (See Comments)   Feels faint, coughing, bp elevated   Mesalamine Other (See Comments)   Unknown   Oxycodone  Other (See Comments)   Hallucinations   Prilosec [omeprazole ] Other (See Comments)   Made legs hurt, hair fell out, and nauseated.    Statins Other (See Comments)   Muscle spasms        Medication List     STOP taking these medications    hydrochlorothiazide  25 MG tablet Commonly known as: HYDRODIURIL        TAKE these medications    acetaminophen  500 MG tablet Commonly known as: TYLENOL  Take 1,000 mg by mouth every 6 (six) hours as needed for mild pain or headache.   amLODipine  10 MG tablet Commonly known as: NORVASC  Take 10 mg by mouth at bedtime.   amoxicillin -clavulanate 875-125 MG tablet Commonly known as: AUGMENTIN  Take 1 tablet by mouth every 12 (twelve) hours.   aspirin  EC 81 MG tablet Take 1 tablet (81 mg total) by mouth daily. Swallow whole.   B COMPLEX 100 PO Take 1 tablet by mouth daily.   benazepril  40 MG tablet Commonly known as: LOTENSIN  Take 40 mg by mouth  at bedtime.   calcium  carbonate 1250 (500 Ca) MG tablet Commonly known as: OS-CAL - dosed in mg of elemental calcium  Take 2 tablets by mouth 2 (two) times daily with a meal.   Complex B-100-Inositol Tbcr Take 1 tablet by mouth daily.   cyclobenzaprine  5 MG tablet Commonly known as: FLEXERIL  Take 1 tablet (5 mg total) by mouth 3 (three) times daily as needed for muscle spasms.   famotidine  20 MG tablet Commonly known as: PEPCID  Take 20 mg by mouth daily.   feeding supplement (OSMOLITE 1.2 CAL) Liqd Take 330 mLs (11 oz total) by mouth 2 (two) times daily.   labetalol  100 MG tablet Commonly known as: NORMODYNE  TAKE 1 TABLET TWICE DAILY (NEED TO KEEP UPCOMING APPOINTMENT IN APRIL TO PREVENT ANY MISSED DOSES) What changed: See the new instructions.   latanoprost  0.005 % ophthalmic  solution Commonly known as: XALATAN  Place 1 drop into both eyes at bedtime.   loperamide  2 MG capsule Commonly known as: IMODIUM  Take 1 capsule (2 mg total) by mouth 2 (two) times daily. What changed:  how much to take when to take this   metoCLOPramide  5 MG tablet Commonly known as: REGLAN  Take 1 tablet (5 mg total) by mouth every 8 (eight) hours as needed for nausea or vomiting.   multivitamin with minerals Tabs tablet Take 1 tablet by mouth daily. Woman's one a day   multivitamins with iron  Tabs tablet Take 1 tablet by mouth daily.   Muscle Rub 10-15 % Crea Apply 1 application topically 2 (two) times daily as needed for muscle pain.   Normal Saline Flush 0.9 % Soln Use 10-40 mLs by intracatheter route every 12 (twelve) hours.   ondansetron  4 MG tablet Commonly known as: ZOFRAN  Take 1 tablet (4 mg total) by mouth every 6 (six) hours as needed for nausea.   pantoprazole  40 MG tablet Commonly known as: PROTONIX  Take 40 mg by mouth 2 (two) times daily before a meal.   polycarbophil 625 MG tablet Commonly known as: FIBERCON Take 1 tablet (625 mg total) by mouth 2 (two) times daily.   psyllium 95 % Pack Commonly known as: HYDROCIL/METAMUCIL Take 1 packet by mouth daily.   Repatha  SureClick 140 MG/ML Soaj Generic drug: Evolocumab  Inject 140 mg (1 pen) into the skin every 14 days   timolol  0.5 % ophthalmic solution Commonly known as: TIMOPTIC  Place 1 drop into both eyes every morning.   traMADol  50 MG tablet Commonly known as: ULTRAM  Take 1 tablet (50 mg total) by mouth every 6 (six) hours as needed for moderate pain (pain score 4-6) or severe pain (pain score 7-10).   traZODone  50 MG tablet Commonly known as: DESYREL  Take 50 mg by mouth at bedtime.        Follow-up Information     Sheldon Standing, MD Follow up in 3 week(s).   Specialties: General Surgery, Colon and Rectal Surgery Why: To follow up after your hospital stay Contact information: [address] [place] [address] [phone]         [facility] Follow up in 1 week(s).   Why: Outpatient drain study at radiology department Contact information: [address] [place] [address] [phone]         [facility]) Follow up.   Specialty: Home Health Services Why: Please continue Home Health PT/OT/RN services with this provider upon discharge. Contact information: [address]  [place] [address] [phone]               Discharge Exam: Filed Weights   12/03/23 1640  Weight: 75.3 kg   Vitals:   12/10/23 2240 12/11/23 0526  BP: 136/60 (!) 132/52  Pulse:  78  Resp:  16  Temp:  98.6 F (37 C)  SpO2:  100%   Examination: Physical Exam:  Constitutional: WN/WD, NAD and appears calm and comfortable Eyes: PERRL, lids and conjunctivae normal, sclerae anicteric  ENMT: External Ears, Nose appear normal. Grossly normal hearing. Mucous membranes are moist. Posterior pharynx clear of any exudate or lesions. Normal dentition.  Neck: Appears normal, supple, no cervical masses, normal ROM, no appreciable thyromegaly Respiratory: Clear to auscultation bilaterally, no wheezing, rales, rhonchi or crackles. Normal respiratory effort and patient is not tachypenic. No accessory muscle use.  Cardiovascular: RRR, no murmurs / rubs / gallops. S1 and S2 auscultated. No extremity edema. 2+ pedal pulses. No carotid bruits.  Abdomen: Soft, non-tender, non-distended. No masses palpated. No appreciable hepatosplenomegaly. Bowel sounds positive.  GU: Deferred. Musculoskeletal: No clubbing / cyanosis of digits/nails. No joint deformity upper and lower extremities. Good ROM, no contractures. Normal strength and muscle tone.  Skin: No rashes, lesions, ulcers. No induration; Warm and dry.  Neurologic: CN 2-12 grossly intact with no focal deficits. Sensation intact in all 4 Extremities,  DTR normal. Strength 5/5 in all 4. Romberg sign cerebellar reflexes not assessed.  Psychiatric: Normal judgment and insight. Alert and oriented x 3. Normal mood and appropriate affect.   Condition at discharge: stable  The results of significant diagnostics from this hospitalization (including imaging, microbiology, ancillary and laboratory) are listed below for reference.   Imaging Studies: IR US  Guide Bx Asp/Drain Result Date: 12/04/2023 INDICATION: 78 year old female with postoperative parastomal fluid collection in the anterior abdominal wall. EXAM: US  IMAGE GUIDED DRAINAGE BY PERCUTANEOUS CATHETE MEDICATIONS: The patient is currently admitted to the hospital and receiving intravenous antibiotics. The antibiotics were administered within an appropriate time frame prior to the initiation of the procedure. ANESTHESIA/SEDATION: Fentanyl  2 mcg IV; Versed  100 mg IV administered by the radiology nurse Moderate Sedation Time:  12 minutes The patient's vital signs and level of consciousness were continuously monitored during the procedure by the interventional radiology nurse under my direct supervision. COMPLICATIONS: None immediate. PROCEDURE: Informed written consent was obtained from the patient after a thorough discussion of the procedural risks, benefits and alternatives. All questions were addressed. Maximal Sterile Barrier Technique was utilized including caps, mask, sterile gowns, sterile gloves, sterile drape, hand hygiene and skin antiseptic. A timeout was performed prior to the initiation of the procedure. Ultrasound was used to interrogate the anterior abdominal wall. A large complex fluid collection can be identified surrounding the stoma. Fluid also tracks into the peritoneal space around the liver. A suitable skin entry site was selected and marked. The skin was sterilely prepped and draped in the standard fashion using chlorhexidine  skin prep. Local anesthesia was attained by infiltration with  1% lidocaine . A small dermatotomy was made. Under real-time ultrasound guidance, an 18 gauge trocar needle was advanced to the fluid collection. A 0.035 wire was advanced into the collection and the trocar needle was removed. The percutaneous tract was dilated to 45 French and a 12 French skater drainage catheter was advanced over the wire and formed. Aspiration was then performed yielding 250 mL thick tan colored purulent fluid. Samples were sent for Gram stain and culture. Next, ultrasound was again used to interrogate the  abdominal wall. Not only is the superficial parastomal fluid collection decompressed, but the perihepatic fluid collection has also decompressed. These fluid collections are clearly communicating. Therefore, no second drainage catheter was placed. The drainage catheter was then flushed and connected to JP bulb suction before being secured to the skin with 0 Prolene suture. IMPRESSION: Successful placement of a 12 French drainage catheter into the parastomal fluid collection with evacuation of 250 mL purulent fluid. Ultrasound confirms that both the parastomal and intraperitoneal perihepatic fluid collections decompressed through the single drainage catheter. Specimens were sent for Gram stain and culture. Electronically Signed   By: Wilkie Lent M.D.   On: 12/04/2023 14:32   CT ABDOMEN PELVIS WO CONTRAST Result Date: 12/03/2023 EXAM: CT ABDOMEN AND PELVIS WITHOUT CONTRAST 12/03/2023 09:44:19 PM TECHNIQUE: CT of the abdomen and pelvis was performed without the administration of intravenous contrast. Multiplanar reformatted images are provided for review. Automated exposure control, iterative reconstruction, and/or weight-based adjustment of the mA/kV was utilized to reduce the radiation dose to as low as reasonably achievable. COMPARISON: Prior examination of 11/08/2023. Remote prior examination of 01/12/2015. CLINICAL HISTORY: Abdominal pain, acute, nonlocalized; upper abd pain around  colostomy site, drainage, hx of prior abscess. FINDINGS: LOWER CHEST: Scattered bibasilar ground glass opacities have developed, likely reflecting atelectasis. Extensive coronary artery calcifications. Clinical cardiac size within normal limits. Hypoattenuation of the cardiac blood pool in keeping with at least moderate anemia. LIVER: Pneumovila again noted within the liver suggesting changes of prior sphincterotomy. No intra- or extrahepatic biliary ductal dilation. Liver otherwise unremarkable. GALLBLADDER AND BILE DUCTS: Cholelithiasis without superimposed pericholecystic inflammatory change. No biliary ductal dilatation. SPLEEN: No acute abnormality. PANCREAS: No acute abnormality. ADRENAL GLANDS: Stable 18 mm nodule within the right adrenal gland, likely an adrenal adenoma and minimally enlarged when compared to remote prior examination of 01/12/2015, where this measured 16 mm. The left adrenal gland is unremarkable. KIDNEYS, URETERS AND BLADDER: Kidneys were unremarkable. Bladder unremarkable. No stones in the kidneys or ureters. No hydronephrosis. No perinephric or periureteral stranding. GI AND BOWEL: Surgical changes of proctocolectomy and right lower quadrant end ileostomy are identified. No evidence of obstruction. Stomach demonstrates no acute abnormality. PERITONEUM AND RETROPERITONEUM: No free intraperitoneal gas or fluid. VASCULATURE: Aorta is normal in caliber. No aortic aneurysm. LYMPH NODES: No lymphadenopathy. REPRODUCTIVE ORGANS: No acute abnormality. BONES AND SOFT TISSUES: Severe lumbar levoscoliosis again noted. No acute bone abnormality. Since the prior examination, the lobulated fluid collection surrounding the ileostomy has enlarged, now measuring up to 5.7 x 12.5 cm within the subcutaneous soft tissues surrounding the terminal ileum and, superiorly, measuring 2.3 x 14.1 cm subjacent to the right upper quadrant anterior abdominal wall. Both these collections appear enlarged since the prior  examination. There is, additionally, increasing punctate gas seen within this collections suggesting superinfection and / or enteric communication. There is relative hyperdensity involving several components of this fluid collection suggest that this may be in part related to hemorrhagic fluid. Lobulated fluid appears to track into the subcutaneous soft tissues of the right lateral abdominal wall through the abdominal wall musculature which may reflect dehiscence of a laparoscopic port incision. IMPRESSION: 1. Multiple lobulated fluid collections surrounding the ileostomy with increasing punctate gas, suggestive of superinfection and/or enteric communication, and relative hyperdensity suggestive of hemorrhagic fluid. These collections have enlarged since the prior examination. 2. Surgical changes of total proctocolectomy and right lower quadrant end ileostomy. 3. Cholelithiasis without superimposed pericholecystic inflammatory change. Electronically signed by: Dorethia Molt MD 12/03/2023 10:17 PM EST  RP Workstation: [id number]    Microbiology: Results for orders placed or performed during the hospital encounter of 12/03/23  Aerobic/Anaerobic Culture w Gram Stain (surgical/deep wound)     Status: None   Collection Time: 12/04/23  1:50 PM   Specimen: Abscess  Result Value Ref Range Status   Specimen Description   Final    ABSCESS Performed at [facility], [address] [place], [place] [address]    Special Requests   Final    NONE Performed at [facility], [address] [place], [place] [address]    Gram Stain   Final    ABUNDANT WBC PRESENT, PREDOMINANTLY PMN ABUNDANT GRAM POSITIVE COCCI IN PAIRS IN CHAINS    Culture   Final    FEW KLEBSIELLA PNEUMONIAE RARE ESCHERICHIA COLI ABUNDANT STREPTOCOCCUS INTERMEDIUS ABUNDANT EIKENELLA CORRODENS Usually susceptible to penicillin and other beta lactam agents,quinolones,macrolides and tetracyclines. NO  ANAEROBES ISOLATED Performed at [facility], [address], [place], [place] [address]    Report Status 12/11/2023 FINAL  Final   Organism ID, Bacteria KLEBSIELLA PNEUMONIAE  Final   Organism ID, Bacteria ESCHERICHIA COLI  Final   Organism ID, Bacteria STREPTOCOCCUS INTERMEDIUS  Final      Susceptibility   Escherichia coli - MIC*    AMPICILLIN 8 SENSITIVE Sensitive     CEFAZOLIN  (NON-URINE) <=1 SENSITIVE Sensitive     CEFEPIME  <=0.12 SENSITIVE Sensitive     ERTAPENEM <=0.12 SENSITIVE Sensitive     CEFTRIAXONE <=0.25 SENSITIVE Sensitive     CIPROFLOXACIN  <=0.06 SENSITIVE Sensitive     GENTAMICIN <=1 SENSITIVE Sensitive     MEROPENEM <=0.25 SENSITIVE Sensitive     TRIMETH/SULFA <=20 SENSITIVE Sensitive     AMPICILLIN/SULBACTAM <=2 SENSITIVE Sensitive     PIP/TAZO Value in next row Sensitive      <=4 SENSITIVEThis is a modified FDA-approved test that has been validated and its performance characteristics determined by the reporting laboratory.  This laboratory is certified under the Clinical Laboratory Improvement Amendments CLIA as qualified to perform high complexity clinical laboratory testing.    * RARE ESCHERICHIA COLI   Klebsiella pneumoniae - MIC*    AMPICILLIN Value in next row Resistant      <=4 SENSITIVEThis is a modified FDA-approved test that has been validated and its performance characteristics determined by the reporting laboratory.  This laboratory is certified under the Clinical Laboratory Improvement Amendments CLIA as qualified to perform high complexity clinical laboratory testing.    CEFAZOLIN  (NON-URINE) Value in next row Sensitive      <=4 SENSITIVEThis is a modified FDA-approved test that has been validated and its performance characteristics determined by the reporting laboratory.  This laboratory is certified under the Clinical Laboratory Improvement Amendments CLIA as qualified to perform high complexity clinical laboratory testing.    CEFEPIME  Value in next  row Sensitive      <=4 SENSITIVEThis is a modified FDA-approved test that has been validated and its performance characteristics determined by the reporting laboratory.  This laboratory is certified under the Clinical Laboratory Improvement Amendments CLIA as qualified to perform high complexity clinical laboratory testing.    ERTAPENEM Value in next row Sensitive      <=4 SENSITIVEThis is a modified FDA-approved test that has been validated and its performance characteristics determined by the reporting laboratory.  This laboratory is certified under the Clinical Laboratory Improvement Amendments CLIA as qualified to perform high complexity clinical laboratory testing.    CEFTRIAXONE Value in next  row Sensitive      <=4 SENSITIVEThis is a modified FDA-approved test that has been validated and its performance characteristics determined by the reporting laboratory.  This laboratory is certified under the Clinical Laboratory Improvement Amendments CLIA as qualified to perform high complexity clinical laboratory testing.    CIPROFLOXACIN  Value in next row Sensitive      <=4 SENSITIVEThis is a modified FDA-approved test that has been validated and its performance characteristics determined by the reporting laboratory.  This laboratory is certified under the Clinical Laboratory Improvement Amendments CLIA as qualified to perform high complexity clinical laboratory testing.    GENTAMICIN Value in next row Sensitive      <=4 SENSITIVEThis is a modified FDA-approved test that has been validated and its performance characteristics determined by the reporting laboratory.  This laboratory is certified under the Clinical Laboratory Improvement Amendments CLIA as qualified to perform high complexity clinical laboratory testing.    MEROPENEM Value in next row Sensitive      <=4 SENSITIVEThis is a modified FDA-approved test that has been validated and its performance characteristics determined by the reporting  laboratory.  This laboratory is certified under the Clinical Laboratory Improvement Amendments CLIA as qualified to perform high complexity clinical laboratory testing.    TRIMETH/SULFA Value in next row Sensitive      <=4 SENSITIVEThis is a modified FDA-approved test that has been validated and its performance characteristics determined by the reporting laboratory.  This laboratory is certified under the Clinical Laboratory Improvement Amendments CLIA as qualified to perform high complexity clinical laboratory testing.    AMPICILLIN/SULBACTAM Value in next row Sensitive      <=4 SENSITIVEThis is a modified FDA-approved test that has been validated and its performance characteristics determined by the reporting laboratory.  This laboratory is certified under the Clinical Laboratory Improvement Amendments CLIA as qualified to perform high complexity clinical laboratory testing.    PIP/TAZO Value in next row Sensitive      <=4 SENSITIVEThis is a modified FDA-approved test that has been validated and its performance characteristics determined by the reporting laboratory.  This laboratory is certified under the Clinical Laboratory Improvement Amendments CLIA as qualified to perform high complexity clinical laboratory testing.    * FEW KLEBSIELLA PNEUMONIAE   Streptococcus intermedius - MIC*    PENICILLIN Value in next row Sensitive      <=4 SENSITIVEThis is a modified FDA-approved test that has been validated and its performance characteristics determined by the reporting laboratory.  This laboratory is certified under the Clinical Laboratory Improvement Amendments CLIA as qualified to perform high complexity clinical laboratory testing.    CEFTRIAXONE Value in next row Sensitive      <=4 SENSITIVEThis is a modified FDA-approved test that has been validated and its performance characteristics determined by the reporting laboratory.  This laboratory is certified under the Clinical Laboratory Improvement  Amendments CLIA as qualified to perform high complexity clinical laboratory testing.    LEVOFLOXACIN Value in next row Sensitive      <=4 SENSITIVEThis is a modified FDA-approved test that has been validated and its performance characteristics determined by the reporting laboratory.  This laboratory is certified under the Clinical Laboratory Improvement Amendments CLIA as qualified to perform high complexity clinical laboratory testing.    VANCOMYCIN Value in next row Sensitive      <=4 SENSITIVEThis is a modified FDA-approved test that has been validated and its performance characteristics determined by the reporting laboratory.  This laboratory is certified under the Clinical  Laboratory Improvement Amendments CLIA as qualified to perform high complexity clinical laboratory testing.    * ABUNDANT STREPTOCOCCUS INTERMEDIUS   Labs: CBC: Recent Labs  Lab 12/06/23 0321 12/07/23 1121 12/08/23 0606 12/09/23 0531 12/10/23 0601 12/11/23 0648  WBC 8.8 8.7 8.9 7.2 6.7 6.4  NEUTROABS 6.1  --  6.5 4.8 4.6 4.2  HGB 9.4* 10.2* 10.3* 9.8* 10.7* 10.5*  HCT 26.9* 29.5* 31.1* 30.1* 32.2* 30.8*  MCV 84.3 86.5 87.1 89.6 87.7 87.0  PLT 352 377 362 348 372 352   Basic Metabolic Panel: Recent Labs  Lab 12/06/23 0321 12/07/23 1121 12/08/23 0606 12/09/23 0531 12/10/23 0601 12/11/23 0648  NA 143 140 142 142 141 143  K 4.3 4.8 4.6 4.1 4.0 3.9  CL 108 107 107 108 108 109  CO2 25 25 25 24  21* 23  GLUCOSE 93 112* 103* 103* 116* 117*  BUN 11 10 13 13 15 18   CREATININE 0.91 0.93 0.91 0.93 0.93 1.01*  CALCIUM  8.6* 9.2 9.8 9.3 9.7 9.4  MG 2.1 1.9 1.9 1.8  --  1.8  PHOS 2.8 1.7* 2.4* 2.0* 2.5 2.4*   Liver Function Tests: Recent Labs  Lab 12/07/23 1121 12/08/23 0606 12/09/23 0531 12/10/23 0601 12/11/23 0648  ALBUMIN 3.2* 3.3* 3.2* 3.4* 3.4*   CBG: No results for input(s): GLUCAP in the last 168 hours.  Discharge time spent: greater than 30 minutes.  Signed: Alejandro Marker, DO [facility] 12/11/2023

## 2023-12-11 NOTE — Progress Notes (Signed)
 Mobility Specialist Progress Note:   12/11/23 1019  Mobility  Activity Ambulated with assistance  Level of Assistance Standby assist, set-up cues, supervision of patient - no hands on  Assistive Device Front wheel walker  Distance Ambulated (ft) 315 ft  Activity Response Tolerated well  Mobility Referral Yes  Mobility visit 1 Mobility  Mobility Specialist Start Time (ACUTE ONLY) 0940  Mobility Specialist Stop Time (ACUTE ONLY) 0955  Mobility Specialist Time Calculation (min) (ACUTE ONLY) 15 min   Pt was received in bed and agreed to mobility. 2x brief standing breaks during ambulation, recovered quickly. Returned to bed with all needs met.  [employer] - Mobility Specialist

## 2023-12-11 NOTE — Progress Notes (Signed)
° ° ° ° °  We will switch to augmentin  and she should be DC with month supply and at least 6 refills if not a year as this is going to take time. She has HSFU with Dr. Dennise.  Donna [name] 12/11/2023, 9:14 AM

## 2023-12-11 NOTE — Progress Notes (Addendum)
 Referring Physician(s): Dr. CANDIE Schultze  Supervising Physician: Hughes Simmonds  Patient Status:  [facility] - In-pt  Chief Complaint:  Abdominal/back pain, post op abdominal fluid collections; s/p drainage of parastomal fluid collection ( Ultrasound confirms that both the parastomal and intraperitoneal perihepatic fluid collections decompressed through the single drainage catheter.)   Subjective:  Patient sitting up on the side of the bed. No questions or concerns at this time.   Allergies: Ozempic (0.25 or 0.5 mg-dose) [semaglutide(0.25 or 0.5mg -dos)], Ezetimibe, Gabapentin , Iodinated contrast media, Mesalamine, Oxycodone , Prilosec [omeprazole ], and Statins  Medications: Prior to Admission medications   Medication Sig Start Date End Date Taking? Authorizing Provider  acetaminophen  (TYLENOL ) 500 MG tablet Take 1,000 mg by mouth every 6 (six) hours as needed for mild pain or headache.   Yes [provider]  amLODipine  (NORVASC ) 10 MG tablet Take 10 mg by mouth at bedtime. 05/20/18  Yes [provider]  aspirin  EC 81 MG tablet Take 1 tablet (81 mg total) by mouth daily. Swallow whole. 03/20/21  Yes Patwardhan, Manish J, MD  B Complex Vitamins (B COMPLEX 100 PO) Take 1 tablet by mouth daily.   Yes [provider]  benazepril  (LOTENSIN ) 40 MG tablet Take 40 mg by mouth at bedtime. 05/14/18  Yes [provider]  calcium  carbonate (OS-CAL - DOSED IN MG OF ELEMENTAL CALCIUM ) 1250 (500 Ca) MG tablet Take 2 tablets by mouth 2 (two) times daily with a meal.   Yes [provider]  cyclobenzaprine  (FLEXERIL ) 5 MG tablet Take 1 tablet (5 mg total) by mouth 3 (three) times daily as needed for muscle spasms. 10/18/23  Yes Schultze Standing, MD  Evolocumab  (REPATHA  SURECLICK) 140 MG/ML SOAJ Inject 140 mg (1 pen) into the skin every 14 days 07/08/23  Yes Patwardhan, Manish J, MD  famotidine  (PEPCID ) 20 MG tablet Take 20 mg by mouth daily.   Yes [provider]   labetalol  (NORMODYNE ) 100 MG tablet TAKE 1 TABLET TWICE DAILY (NEED TO KEEP UPCOMING APPOINTMENT IN APRIL TO PREVENT ANY MISSED DOSES) Patient taking differently: Take 100 mg by mouth 2 (two) times daily. 05/03/23  Yes Patwardhan, Manish J, MD  latanoprost  (XALATAN ) 0.005 % ophthalmic solution Place 1 drop into both eyes at bedtime. 05/05/22  Yes [provider]  loperamide  (IMODIUM ) 2 MG capsule Take 1 capsule (2 mg total) by mouth 2 (two) times daily. Patient taking differently: Take 4 mg by mouth in the morning, at noon, and at bedtime. 06/04/19  Yes Teresa Lonni HERO, MD  Menthol -Methyl Salicylate (MUSCLE RUB) 10-15 % CREA Apply 1 application topically 2 (two) times daily as needed for muscle pain.   Yes [provider]  metoCLOPramide  (REGLAN ) 5 MG tablet Take 1 tablet (5 mg total) by mouth every 8 (eight) hours as needed for nausea or vomiting. 10/31/23  Yes Schultze Standing, MD  Multiple Vitamin (MULTIVITAMIN WITH MINERALS) TABS tablet Take 1 tablet by mouth daily. Woman's one a day   Yes [provider]  pantoprazole  (PROTONIX ) 40 MG tablet Take 40 mg by mouth 2 (two) times daily before a meal.    Yes [provider]  polycarbophil (FIBERCON) 625 MG tablet Take 1 tablet (625 mg total) by mouth 2 (two) times daily. 11/12/23 12/12/23 Yes Patsy Lenis, MD  psyllium (HYDROCIL/METAMUCIL) 95 % PACK Take 1 packet by mouth daily. 10/30/23  Yes Schultze Standing, MD  timolol  (TIMOPTIC ) 0.5 % ophthalmic solution Place 1 drop into both eyes every morning. 06/17/22  Yes  [provider]  traMADol  (ULTRAM ) 50 MG tablet Take 1 tablet (50 mg total) by mouth every 6 (six) hours as needed for moderate pain (pain score 4-6) or severe pain (pain score 7-10). 11/12/23  Yes Patsy Lenis, MD  Vitamins-Lipotropics (COMPLEX B-100-INOSITOL) TBCR Take 1 tablet by mouth daily. 11/20/23  Yes [provider]  amoxicillin -clavulanate (AUGMENTIN ) 875-125 MG tablet Take 1 tablet  by mouth every 12 (twelve) hours. 12/11/23 07/08/24  Fleeta Kathie Jomarie LOISE, MD  Ensure Max Protein (ENSURE MAX PROTEIN) LIQD Take 330 mLs (11 oz total) by mouth 2 (two) times daily. 12/11/23   Sherrill Cable Latif, DO  Multiple Vitamins-Iron  (MULTIVITAMINS WITH IRON ) TABS tablet Take 1 tablet by mouth daily. 11/13/23   Patsy Lenis, MD  ondansetron  (ZOFRAN ) 4 MG tablet Take 1 tablet (4 mg total) by mouth every 6 (six) hours as needed for nausea. 12/11/23   Sherrill Cable Latif, DO  sodium chloride  flush (NS) 0.9 % SOLN Use 10-40 mLs by intracatheter route every 12 (twelve) hours. 12/11/23   Sherrill Cable Latif, DO  traZODone  (DESYREL ) 50 MG tablet Take 50 mg by mouth at bedtime. 02/11/23   [provider]     Vital Signs: BP (!) 132/52 (BP Location: Left Arm)   Pulse 78   Temp 98.6 F (37 C) (Oral)   Resp 16   Ht 5' 3 (1.6 m)   Wt 166 lb (75.3 kg)   SpO2 100%   BMI 29.41 kg/m   Physical Exam Vitals and nursing note reviewed.  Constitutional:      Appearance: She is well-developed.  HENT:     Head: Normocephalic and atraumatic.  Eyes:     Conjunctiva/sclera: Conjunctivae normal.  Pulmonary:     Effort: Pulmonary effort is normal.  Abdominal:     Comments: Positive left midline  drain to  suction . Site is unremarkable with no erythema, edema, tenderness, bleeding or drainage noted at exit site. Suture and stat lock in place. Dressing is clean dry and intact. 20 ml ml of  tan pink tinged colored fluid noted in \ bulb suction device. Drain is able to be flushed easily. No leakage or pain with flushing.  Insertion site clean and dry.     Musculoskeletal:     Cervical back: Normal range of motion.  Neurological:     General: No focal deficit present.     Mental Status: She is alert and oriented to person, place, and time. Mental status is at baseline.  Psychiatric:        Mood and Affect: Mood normal.        Behavior: Behavior normal.        Thought Content: Thought  content normal.        Judgment: Judgment normal.     Imaging: No results found.  Labs:  CBC: Recent Labs    12/08/23 0606 12/09/23 0531 12/10/23 0601 12/11/23 0648  WBC 8.9 7.2 6.7 6.4  HGB 10.3* 9.8* 10.7* 10.5*  HCT 31.1* 30.1* 32.2* 30.8*  PLT 362 348 372 352    COAGS: Recent Labs    11/09/23 0542 12/04/23 1059  INR 1.0 1.0    BMP: Recent Labs    12/08/23 0606 12/09/23 0531 12/10/23 0601 12/11/23 0648  NA 142 142 141 143  K 4.6 4.1 4.0 3.9  CL 107 108 108 109  CO2 25 24 21* 23  GLUCOSE 103* 103* 116* 117*  BUN 13 13 15 18   CALCIUM  9.8 9.3 9.7  9.4  CREATININE 0.91 0.93 0.93 1.01*  GFRNONAA >60 >60 >60 57*    LIVER FUNCTION TESTS: Recent Labs    11/08/23 1114 11/09/23 0542 12/03/23 1646 12/07/23 1121 12/08/23 0606 12/09/23 0531 12/10/23 0601 12/11/23 0648  BILITOT 0.5 0.6 0.4  --   --   --   --   --   AST 24 25 29   --   --   --   --   --   ALT 18 15 23   --   --   --   --   --   ALKPHOS 133* 116 133*  --   --   --   --   --   PROT 6.8 6.2* 6.8  --   --   --   --   --   ALBUMIN 3.3* 3.0* 3.2*   < > 3.3* 3.2* 3.4* 3.4*   < > = values in this interval not displayed.    Assessment and Plan:  77 y.o. female inpatient. History of ulcerative colitic, colon cancer s/p colectomy. Incarcerated hernia S/s para ileostomy hernia repair  and colostomy revision 10.17.25. S/p RUQ aspiration on 11.8.25. Cultures showed no growth  Presented to the ED at [facility] on 12.2.25 with abdominal pain and drainage around her surgical site. IR placed a left midline drain.   Drain Location: Left midline Size: Fr size: 12 Fr Date of placement: 12.3.25  Currently to: Drain collection device: suction bulb 24 hour output:  Output by Drain (mL) 12/09/23 0700 - 12/09/23 1459 12/09/23 1500 - 12/09/23 2259 12/09/23 2300 - 12/10/23 0659 12/10/23 0700 - 12/10/23 1459 12/10/23 1500 - 12/10/23 2259 12/10/23 2300 - 12/11/23 0659 12/11/23 0700 - 12/11/23 1235  Closed System Drain 1  Left;Midline LLQ Bulb (JP) 12 Fr. 40  50 10  15   Cultures grew klebsiella pneumoniae and e. Coli. tan pink tinged output noted to be in the JP drain.   Interval imaging/drain manipulation:  None since drain placement  Current examination: Flushes/aspirates easily.  Insertion site unremarkable. Suture and stat lock in place. Dressed appropriately.   Plan: Continue TID flushes with 5 cc NS. Record output Q shift. Dressing changes QD or PRN if soiled.  Call IR APP or on call IR MD if difficulty flushing or sudden change in drain output.  Repeat imaging/possible drain injection once output < 10 mL/QD (excluding flush material). Consideration for drain removal if output is < 10 mL/QD (excluding flush material), pending discussion with the providing surgical service.  Discharge planning: Outpatient orders in place.. Typically patient will follow up with IR clinic 10-14 days post d/c for repeat imaging/possible drain injection. IR scheduler will contact patient with date/time of appointment. Patient will need to flush drain QD with 5 cc NS, record output QD, dressing changes every 2-3 days or earlier if soiled.   IR will continue to follow - please call with questions or concerns.     Electronically Signed: Delon JAYSON Beagle, NP 12/11/2023, 12:34 PM   I spent a total of 15 Minutes at the patient's bedside AND on the patient's hospital floor or unit, greater than 50% of which was counseling/coordinating care for left midline drain

## 2023-12-11 NOTE — Hospital Course (Signed)
 Donna [name] is a 78 y.o. female with medical history significant for ulcerative colitis, colon cancer s/p colectomy with ileostomy, CKD-3A, GERD, DDD, OSA, incarcerated abdominal wall hernia s/p surgery/repair on 10/18/23 who presented to the ED for evaluation of pain and drainage around her abdominal incision site and admitted for intra-abdominal infection.  Patient placed empirically on IV antibiotics.  General surgery consulted.  IR also consulted for drain placement per general surgery.    Assessment and Plan:  intra-abdominal infection/history of incarcerated abdominal wall hernia s/p repair -Patient noted with multiple hospitalizations over the past month for complications related to recent hernia repair. - Patient presented with diffuse abdominal pain x 3 to 4 days prior to admission with increased drainage and pain around surgical site. - CT abdomen and pelvis with multiple loculated fluid collections surrounding the ileostomy with increasing punctuate gas, suggestive of superinfection and/or enteric communication and relative hyperdensity suggestive of hemorrhagic fluid collections that have enlarged since prior examination. - Patient seen in consultation by general surgery who has asked IR to place drains in collections for now and cultures to be obtained. - It is noted per general surgery the last fluid collection aspiration of the abdominal fluid collection 11/8 was consistent with seroma and cultures negative. -Patient seen by IR and patient status post 12 French drain placement and left mid abdominal region with evacuation of 250 cc of purulent fluid, 12/04/2023. -General surgery feels patient will need a repeat scan to ensure abscesses are resolving as well as drain study in about 2 weeks from drain placement. - Cultures from abscess with few Klebsiella pneumonia, Streptococcus intermedius, eikenella corrodens.  - Continue empiric IV Zosyn . -Patient seen in consultation by ID  who are recommending continuation of IV Zosyn  for now, follow-up abscess cultures and feel patient would likely need protracted antibiotics with close follow-up with ID, general surgery and interventional radiology. -Per IR patient will be scheduled for follow-up IR drain clinic next week with CT and drain injection. - Per general surgery.     CKD 3A -Creatinine noted at 1.32 which is not significantly different from recent baseline but slightly worse than 0.99 3 weeks ago. - Renal function improved with hydration and creatinine currently at 0.93. - Follow.   Essential Hypertension  - Controlled on Norvasc  10 mg daily, Lotensin  40 mg daily, labetalol  100 mg twice daily.    4.  Colon cancer status post colectomy with ileostomy -Patient noted with good output from ileostomy bag. -Ileostomy bag noted to have burst x 2 per patient on 12/09/2023. -Wound care RN instructed patient's floor RN on management of ileostomy bag.. - Continue FiberCon, loperamide .   5.  GERD - Continue Protonix , Pepcid .     6.  Chronic leg swelling -Patient noted on admission with 1+ bilateral lower extremity edema on exam unchanged. - TED hose.   7.  Generalized weakness -In the setting of acute illness. - PT/OT following and recommending home health.   Normocytic Anemia - Likely anemia of chronic disease and possible hematoma noted on CT abdomen and pelvis.. - Patient with no overt bleeding. - Anemia panel from 11/08/2023 with iron  level of 15, ferritin of 467, folate of > 20.  Vitamin B12 1740. -Hemoglobin noted at 6.7 on 12/05/2023.  - Status post transfusion 2 months PRBCs with hemoglobin currently at 10.7 this morning.  - Follow H&H. - Transfusion threshold hemoglobin < 7.   9.  Hypophosphatemia/hypomagnesemia/hypokalemia - Magnesium  1.8.  - Phosphorus at 2.5. - Potassium of 4.0.   -  Continue oral K-Phos 250 mg p.o. twice daily x 3 days.    - Repeat labs in AM.    Hypoalbuminemia: Patient's Albumin  Trend: Recent Labs  Lab 12/03/23 1646 12/07/23 1121 12/08/23 0606 12/09/23 0531 12/10/23 0601 12/11/23 0648  ALBUMIN 3.2* 3.2* 3.3* 3.2* 3.4* 3.4*  -CTM & Trend & repeat CMP in the AM  Overweight: Complicates overall prognosis and care. Estimated body mass index is 29.41 kg/m as calculated from the following:   Height as of this encounter: 5' 3 (1.6 m).   Weight as of this encounter: 75.3 kg. Weight Loss and Dietary Counseling given

## 2023-12-11 NOTE — TOC Transition Note (Signed)
 Transition of Care [facility]) - Discharge Note   Patient Details  Name: Donna [name] MRN: [id number] Date of Birth: [birth year]  Transition of Care [facility] CM/SW Contact:  Heather DELENA Saltness, LCSW Phone Number: 12/11/2023, 11:03 AM   Clinical Narrative:    Pt discharging home today. Pt set up with [facility] PT/OT/RN services. HH orders placed. Pt has needed DME in the home. Pt's son to transport her home upon discharge. No further TOC needs at this time.   Final next level of care: Home/Self Care Barriers to Discharge: Barriers Resolved   Patient Goals and CMS Choice Patient states their goals for this hospitalization and ongoing recovery are:: To return home     [place] ownership interest in [facility].provided to:: Patient    Discharge Placement Home  Patient to be transferred to facility by: Pt's son Name of family member notified: Patient Patient and family notified of of transfer: 12/11/23  Discharge Plan and Services Additional resources added to the After Visit Summary for  [facility]   Discharge Planning Services: CM Consult            DME Arranged: N/A DME Agency: NA       HH Arranged: PT, OT, RN HH Agency: [facility] Date [facility] Agency Contacted: 12/10/23 Time HH Agency Contacted: 1103 Representative spoke with at [facility] Agency: Cindie  Social Drivers of Health (SDOH) Interventions SDOH Screenings   Food Insecurity: No Food Insecurity (12/05/2023)  Recent Concern: Food Insecurity - Food Insecurity Present (12/04/2023)  Housing: Low Risk  (12/05/2023)  Transportation Needs: No Transportation Needs (12/05/2023)  Utilities: Not At Risk (12/05/2023)  Depression (PHQ2-9): Low Risk  (11/03/2021)  Social Connections: Moderately Integrated (12/04/2023)  Tobacco Use: Low Risk  (12/03/2023)     Readmission Risk Interventions    12/05/2023    5:13 PM 10/22/2023    2:13 PM  Readmission Risk Prevention Plan  Transportation Screening Complete  Complete  PCP or Specialist Appt within 5-7 Days  Complete  PCP or Specialist Appt within 3-5 Days Complete   Home Care Screening  Complete  Medication Review (RN CM)  Complete  HRI or Home Care Consult Complete   Social Work Consult for Recovery Care Planning/Counseling Complete   Palliative Care Screening Not Applicable   Medication Review Ocean[employer]) Complete     Signed: Heather Saltness, MSW, LCSW Clinical Social Worker Inpatient Care Management 12/11/2023 11:05 AM

## 2023-12-11 NOTE — Plan of Care (Signed)
°  Problem: Education: Goal: Knowledge of General Education information will improve Description: Including pain rating scale, medication(s)/side effects and non-pharmacologic comfort measures 12/11/2023 1138 by Reesa Burnard HERO, RN Outcome: Adequate for Discharge 12/11/2023 1043 by Reesa Burnard HERO, RN Outcome: Progressing   Problem: Health Behavior/Discharge Planning: Goal: Ability to manage health-related needs will improve 12/11/2023 1138 by Reesa Burnard HERO, RN Outcome: Adequate for Discharge 12/11/2023 1043 by Reesa Burnard HERO, RN Outcome: Progressing   Problem: Clinical Measurements: Goal: Ability to maintain clinical measurements within normal limits will improve 12/11/2023 1138 by Reesa Burnard HERO, RN Outcome: Adequate for Discharge 12/11/2023 1043 by Reesa Burnard HERO, RN Outcome: Progressing Goal: Will remain free from infection 12/11/2023 1138 by Reesa Burnard HERO, RN Outcome: Adequate for Discharge 12/11/2023 1043 by Reesa Burnard HERO, RN Outcome: Progressing Goal: Diagnostic test results will improve 12/11/2023 1138 by Reesa Burnard HERO, RN Outcome: Adequate for Discharge 12/11/2023 1043 by Reesa Burnard HERO, RN Outcome: Progressing Goal: Respiratory complications will improve 12/11/2023 1138 by Reesa Burnard HERO, RN Outcome: Adequate for Discharge 12/11/2023 1043 by Reesa Burnard HERO, RN Outcome: Progressing Goal: Cardiovascular complication will be avoided 12/11/2023 1138 by Reesa Burnard HERO, RN Outcome: Adequate for Discharge 12/11/2023 1043 by Reesa Burnard HERO, RN Outcome: Progressing   Problem: Activity: Goal: Risk for activity intolerance will decrease 12/11/2023 1138 by [place], Burnard HERO, RN Outcome: Adequate for Discharge 12/11/2023 1043 by Reesa Burnard HERO, RN Outcome: Progressing   Problem: Nutrition: Goal: Adequate nutrition will be maintained 12/11/2023  1138 by Reesa Burnard HERO, RN Outcome: Adequate for Discharge 12/11/2023 1043 by Reesa Burnard HERO, RN Outcome: Progressing   Problem: Coping: Goal: Level of anxiety will decrease 12/11/2023 1138 by Reesa Burnard HERO, RN Outcome: Adequate for Discharge 12/11/2023 1043 by Reesa Burnard HERO, RN Outcome: Progressing   Problem: Elimination: Goal: Will not experience complications related to bowel motility 12/11/2023 1138 by Reesa Burnard HERO, RN Outcome: Adequate for Discharge 12/11/2023 1043 by Reesa Burnard HERO, RN Outcome: Progressing Goal: Will not experience complications related to urinary retention 12/11/2023 1138 by Reesa Burnard HERO, RN Outcome: Adequate for Discharge 12/11/2023 1043 by Reesa Burnard HERO, RN Outcome: Progressing   Problem: Pain Managment: Goal: General experience of comfort will improve and/or be controlled 12/11/2023 1138 by Reesa Burnard HERO, RN Outcome: Adequate for Discharge 12/11/2023 1043 by Reesa Burnard HERO, RN Outcome: Progressing   Problem: Safety: Goal: Ability to remain free from injury will improve 12/11/2023 1138 by Reesa Burnard HERO, RN Outcome: Adequate for Discharge 12/11/2023 1043 by Reesa Burnard HERO, RN Outcome: Progressing   Problem: Skin Integrity: Goal: Risk for impaired skin integrity will decrease 12/11/2023 1138 by Reesa Burnard HERO, RN Outcome: Adequate for Discharge 12/11/2023 1043 by Reesa Burnard HERO, RN Outcome: Progressing

## 2023-12-11 NOTE — Progress Notes (Signed)
 Subjective:  No new complaints  Antibiotics:  Anti-infectives (From admission, onward)    Start     Dose/Rate Route Frequency Ordered Stop   12/11/23 1015  amoxicillin -clavulanate (AUGMENTIN ) 875-125 MG per tablet 1 tablet        1 tablet Oral Every 12 hours 12/11/23 0915     12/11/23 0000  amoxicillin -clavulanate (AUGMENTIN ) 875-125 MG tablet        1 tablet Oral Every 12 hours 12/11/23 0916 07/08/24 2359   12/03/23 2300  piperacillin -tazobactam (ZOSYN ) IVPB 3.375 g  Status:  Discontinued        3.375 g 12.5 mL/hr over 240 Minutes Intravenous Every 8 hours 12/03/23 2247 12/11/23 0915       Medications: Scheduled Meds:  sodium chloride    Intravenous Once   acetaminophen   500 mg Oral TID WC & HS   amLODipine   10 mg Oral QHS   amoxicillin -clavulanate  1 tablet Oral Q12H   aspirin  EC  81 mg Oral Daily   benazepril   40 mg Oral QHS   calcium  carbonate  2 tablet Oral BID WC   famotidine   20 mg Oral Daily   labetalol   100 mg Oral BID   latanoprost   1 drop Both Eyes QHS   lidocaine   15 mL Oral Once   loperamide   2 mg Oral TID   multivitamins with iron   1 tablet Oral Daily   pantoprazole   40 mg Oral BID AC   phosphorus  250 mg Oral BID   polycarbophil  625 mg Oral BID   Ensure Max Protein  11 oz Oral BID   sodium chloride  flush  10-40 mL Intracatheter Q12H   sodium chloride  flush  5 mL Intracatheter Q8H   timolol   1 drop Both Eyes Daily   Continuous Infusions:  magnesium  sulfate bolus IVPB     PRN Meds:.bisacodyl , HYDROcodone -acetaminophen , HYDROmorphone  (DILAUDID ) injection, loperamide , methocarbamol  (ROBAXIN ) injection, methocarbamol , ondansetron  **OR** ondansetron  (ZOFRAN ) IV, mouth rinse, sodium chloride  flush    Objective: Weight change:   Intake/Output Summary (Last 24 hours) at 12/11/2023 1002 Last data filed at 12/11/2023 0639 Gross per 24 hour  Intake 260 ml  Output 325 ml  Net -65 ml   Blood pressure (!) 132/52, pulse 78, temperature 98.6 F  (37 C), temperature source Oral, resp. rate 16, height 5' 3 (1.6 m), weight 75.3 kg, SpO2 100%. Temp:  [98.6 F (37 C)-98.7 F (37.1 C)] 98.6 F (37 C) (12/10 0526) Pulse Rate:  [74-81] 78 (12/10 0526) Resp:  [15-16] 16 (12/10 0526) BP: (128-142)/(52-64) 132/52 (12/10 0526) SpO2:  [100 %] 100 % (12/10 0526)  Physical Exam: Physical Exam Constitutional:      Appearance: She is well-developed.  HENT:     Head: Normocephalic and atraumatic.  Eyes:     Conjunctiva/sclera: Conjunctivae normal.  Cardiovascular:     Rate and Rhythm: Normal rate and regular rhythm.  Pulmonary:     Effort: Pulmonary effort is normal. No respiratory distress.     Breath sounds: No wheezing.  Abdominal:     General: There is no distension.     Palpations: Abdomen is soft.  Musculoskeletal:        General: No tenderness. Normal range of motion.     Cervical back: Normal range of motion and neck supple.  Skin:    General: Skin is warm and dry.     Coloration: Skin is not pale.     Findings: No erythema or rash.  Neurological:     Mental Status: She is alert and oriented to person, place, and time.  Psychiatric:        Mood and Affect: Mood normal.        Behavior: Behavior normal.        Thought Content: Thought content normal.        Judgment: Judgment normal.      CBC:    BMET Recent Labs    12/10/23 0601 12/11/23 0648  NA 141 143  K 4.0 3.9  CL 108 109  CO2 21* 23  GLUCOSE 116* 117*  BUN 15 18  CREATININE 0.93 1.01*  CALCIUM  9.7 9.4     Liver Panel  Recent Labs    12/10/23 0601 12/11/23 0648  ALBUMIN 3.4* 3.4*       Sedimentation Rate No results for input(s): ESRSEDRATE in the last 72 hours. C-Reactive Protein No results for input(s): CRP in the last 72 hours.  Micro Results: Recent Results (from the past 720 hours)  Aerobic/Anaerobic Culture w Gram Stain (surgical/deep wound)     Status: None   Collection Time: 12/04/23  1:50 PM   Specimen: Abscess   Result Value Ref Range Status   Specimen Description   Final    ABSCESS Performed at [facility], [address] [place], [place] [address]    Special Requests   Final    NONE Performed at [facility], [address], [place], [place] [address]    Gram Stain   Final    ABUNDANT WBC PRESENT, PREDOMINANTLY PMN ABUNDANT GRAM POSITIVE COCCI IN PAIRS IN CHAINS    Culture   Final    FEW KLEBSIELLA PNEUMONIAE RARE ESCHERICHIA COLI ABUNDANT STREPTOCOCCUS INTERMEDIUS ABUNDANT EIKENELLA CORRODENS Usually susceptible to penicillin and other beta lactam agents,quinolones,macrolides and tetracyclines. NO ANAEROBES ISOLATED Performed at [facility], [address], [place], [place] [address]    Report Status 12/11/2023 FINAL  Final   Organism ID, Bacteria KLEBSIELLA PNEUMONIAE  Final   Organism ID, Bacteria ESCHERICHIA COLI  Final   Organism ID, Bacteria STREPTOCOCCUS INTERMEDIUS  Final      Susceptibility   Escherichia coli - MIC*    AMPICILLIN 8 SENSITIVE Sensitive     CEFAZOLIN  (NON-URINE) <=1 SENSITIVE Sensitive     CEFEPIME  <=0.12 SENSITIVE Sensitive     ERTAPENEM <=0.12 SENSITIVE Sensitive     CEFTRIAXONE <=0.25 SENSITIVE Sensitive     CIPROFLOXACIN  <=0.06 SENSITIVE Sensitive     GENTAMICIN <=1 SENSITIVE Sensitive     MEROPENEM <=0.25 SENSITIVE Sensitive     TRIMETH/SULFA <=20 SENSITIVE Sensitive     AMPICILLIN/SULBACTAM <=2 SENSITIVE Sensitive     PIP/TAZO Value in next row Sensitive      <=4 SENSITIVEThis is a modified FDA-approved test that has been validated and its performance characteristics determined by the reporting laboratory.  This laboratory is certified under the Clinical Laboratory Improvement Amendments CLIA as qualified to perform high complexity clinical laboratory testing.    * RARE ESCHERICHIA COLI   Klebsiella pneumoniae - MIC*    AMPICILLIN Value in next row Resistant      <=4 SENSITIVEThis is a modified  FDA-approved test that has been validated and its performance characteristics determined by the reporting laboratory.  This laboratory is certified under the Clinical Laboratory Improvement Amendments CLIA as qualified to perform high complexity clinical laboratory testing.    CEFAZOLIN  (NON-URINE) Value in next row Sensitive      <=4 SENSITIVEThis is  a modified FDA-approved test that has been validated and its performance characteristics determined by the reporting laboratory.  This laboratory is certified under the Clinical Laboratory Improvement Amendments CLIA as qualified to perform high complexity clinical laboratory testing.    CEFEPIME  Value in next row Sensitive      <=4 SENSITIVEThis is a modified FDA-approved test that has been validated and its performance characteristics determined by the reporting laboratory.  This laboratory is certified under the Clinical Laboratory Improvement Amendments CLIA as qualified to perform high complexity clinical laboratory testing.    ERTAPENEM Value in next row Sensitive      <=4 SENSITIVEThis is a modified FDA-approved test that has been validated and its performance characteristics determined by the reporting laboratory.  This laboratory is certified under the Clinical Laboratory Improvement Amendments CLIA as qualified to perform high complexity clinical laboratory testing.    CEFTRIAXONE Value in next row Sensitive      <=4 SENSITIVEThis is a modified FDA-approved test that has been validated and its performance characteristics determined by the reporting laboratory.  This laboratory is certified under the Clinical Laboratory Improvement Amendments CLIA as qualified to perform high complexity clinical laboratory testing.    CIPROFLOXACIN  Value in next row Sensitive      <=4 SENSITIVEThis is a modified FDA-approved test that has been validated and its performance characteristics determined by the reporting laboratory.  This laboratory is certified under the  Clinical Laboratory Improvement Amendments CLIA as qualified to perform high complexity clinical laboratory testing.    GENTAMICIN Value in next row Sensitive      <=4 SENSITIVEThis is a modified FDA-approved test that has been validated and its performance characteristics determined by the reporting laboratory.  This laboratory is certified under the Clinical Laboratory Improvement Amendments CLIA as qualified to perform high complexity clinical laboratory testing.    MEROPENEM Value in next row Sensitive      <=4 SENSITIVEThis is a modified FDA-approved test that has been validated and its performance characteristics determined by the reporting laboratory.  This laboratory is certified under the Clinical Laboratory Improvement Amendments CLIA as qualified to perform high complexity clinical laboratory testing.    TRIMETH/SULFA Value in next row Sensitive      <=4 SENSITIVEThis is a modified FDA-approved test that has been validated and its performance characteristics determined by the reporting laboratory.  This laboratory is certified under the Clinical Laboratory Improvement Amendments CLIA as qualified to perform high complexity clinical laboratory testing.    AMPICILLIN/SULBACTAM Value in next row Sensitive      <=4 SENSITIVEThis is a modified FDA-approved test that has been validated and its performance characteristics determined by the reporting laboratory.  This laboratory is certified under the Clinical Laboratory Improvement Amendments CLIA as qualified to perform high complexity clinical laboratory testing.    PIP/TAZO Value in next row Sensitive      <=4 SENSITIVEThis is a modified FDA-approved test that has been validated and its performance characteristics determined by the reporting laboratory.  This laboratory is certified under the Clinical Laboratory Improvement Amendments CLIA as qualified to perform high complexity clinical laboratory testing.    * FEW KLEBSIELLA PNEUMONIAE    Streptococcus intermedius - MIC*    PENICILLIN Value in next row Sensitive      <=4 SENSITIVEThis is a modified FDA-approved test that has been validated and its performance characteristics determined by the reporting laboratory.  This laboratory is certified under the Clinical Laboratory Improvement Amendments CLIA as qualified to perform high complexity  clinical laboratory testing.    CEFTRIAXONE Value in next row Sensitive      <=4 SENSITIVEThis is a modified FDA-approved test that has been validated and its performance characteristics determined by the reporting laboratory.  This laboratory is certified under the Clinical Laboratory Improvement Amendments CLIA as qualified to perform high complexity clinical laboratory testing.    LEVOFLOXACIN Value in next row Sensitive      <=4 SENSITIVEThis is a modified FDA-approved test that has been validated and its performance characteristics determined by the reporting laboratory.  This laboratory is certified under the Clinical Laboratory Improvement Amendments CLIA as qualified to perform high complexity clinical laboratory testing.    VANCOMYCIN Value in next row Sensitive      <=4 SENSITIVEThis is a modified FDA-approved test that has been validated and its performance characteristics determined by the reporting laboratory.  This laboratory is certified under the Clinical Laboratory Improvement Amendments CLIA as qualified to perform high complexity clinical laboratory testing.    * ABUNDANT STREPTOCOCCUS INTERMEDIUS    Studies/Results: No results found.    Assessment/Plan:  INTERVAL HISTORY: Streptococcus intermedius was sensitive to penicillin   Principal Problem:   Intra-abdominal infection Active Problems:   Hypertension   Status post ileostomy (HCC)   History of hernia repair   CKD stage 3a, GFR 45-59 ml/min (HCC)   Generalized weakness   Anemia   Polymicrobial bacterial infection   Hx of colon cancer, stage I   Infected  hernioplasty mesh    Donna [name] is a 78 y.o. female with past medical history significant for ulcerative colitis, colon cancer status post curative partial colectomy ileostomy creation who had an incarcerated abdominal wall hernia status post surgery repair October 1725 now admitted with an abscess around the ileostomy and also in proximity to her mesh.  She is status post IR guided drainage of pus from this area with cultures yielding  E coli, Klebsiella, streptococcus intermedius and eikenella growing on culture   I agree with Dr. Sheldon that she should have a repeat CT scan to ensure that it her abscesses are resolving as well as drain study  The Streptococcus intermedius was sensitive to penicillin.  We have changed the patient over to oral Augmentin  and she should be on this until her infection has resolved and again I have concerns that she will need mesh removal for this to happen but she can be safely discharged today from an antibiotic standpoint with a month of antibiotics with I would put at least 6 refills if not a years worth of refills she has a follow-up appointment with my partner Dr. Dennise Consuelo Dorina Ledin has an appointment on 01/06/2023 at 1PM with Dr. Dennise  at  [facility], which  is located in the [facility] at  [address] in [place].  [address], which is located to the left of the elevators.  Phone: [phone]  Fax: [phone]  [email or web address]  The patient should arrive 30 minutes prior to their appoitment.   I personally spent a total of 50 minutes in the care of the patient today including preparing to see the patient, getting/reviewing separately obtained history, performing a medically appropriate exam/evaluation, counseling and educating, placing orders, referring and communicating with other health care professionals, documenting clinical information in the  EHR, independently interpreting results, and communicating results.  Evaluation of the patient requires complex antimicrobial therapy evaluation, counseling , isolation needs to reduce disease  transmission and risk assessment and mitigation.     LOS: 8 days   Donna [name] 12/11/2023, 10:02 AM

## 2023-12-12 ENCOUNTER — Telehealth: Payer: Self-pay | Admitting: Family Medicine

## 2023-12-12 ENCOUNTER — Telehealth: Payer: Self-pay

## 2023-12-12 ENCOUNTER — Encounter: Payer: Self-pay | Admitting: Family Medicine

## 2023-12-12 ENCOUNTER — Ambulatory Visit: Admitting: Family Medicine

## 2023-12-12 VITALS — BP 130/68 | Ht 63.0 in | Wt 164.5 lb

## 2023-12-12 DIAGNOSIS — K6289 Other specified diseases of anus and rectum: Secondary | ICD-10-CM | POA: Insufficient documentation

## 2023-12-12 DIAGNOSIS — K5100 Ulcerative (chronic) pancolitis without complications: Secondary | ICD-10-CM | POA: Insufficient documentation

## 2023-12-12 DIAGNOSIS — R0609 Other forms of dyspnea: Secondary | ICD-10-CM

## 2023-12-12 DIAGNOSIS — G4733 Obstructive sleep apnea (adult) (pediatric): Secondary | ICD-10-CM

## 2023-12-12 DIAGNOSIS — Z9289 Personal history of other medical treatment: Secondary | ICD-10-CM

## 2023-12-12 NOTE — Transitions of Care (Post Inpatient/ED Visit) (Signed)
° °  12/12/2023  Name: Donna [name] MRN: [id number] DOB: 1945-05-02  Today's TOC FU Call Status: Today's TOC FU Call Status:: Unsuccessful Call (1st Attempt) Unsuccessful Call (1st Attempt) Date: 12/12/23  Attempted to reach the patient regarding the most recent Inpatient/ED visit.  Follow Up Plan: Additional outreach attempts will be made to reach the patient to complete the Transitions of Care (Post Inpatient/ED visit) call.   Karisa Nesser J. Linh Hedberg RN, MSN [facility], [facility] RN Care Manager Direct Dial: [phone]  Fax: [phone] Website: [email or web address]

## 2023-12-12 NOTE — Telephone Encounter (Signed)
 Patient called to reschedule appointment due to was in the hospital for 4 weeks

## 2023-12-12 NOTE — Patient Instructions (Signed)

## 2023-12-12 NOTE — Progress Notes (Signed)
 PATIENT: Donna [name] DOB: Aug 10, 1945  REASON FOR VISIT: follow up HISTORY FROM: patient  Chief Complaint  Patient presents with   RM1/CPAP    Pt is here Alone.      HISTORY OF PRESENT ILLNESS:  12/12/2023 ALL:  Donna [name] returns for follow up for OSA on CPAP. She has not used therapy in a few months. She has had multiple hospitalizations d/t abdominal infections s/p hernia repair. She was discharged home, yesterday. She is planning to restart therapy this week. She has lost about 20lbs since being in the hospital. She is easily fatigued and has some shortness of breath. She has follow up with PCP this week. No signs of infection.   04/10/2023 ALL:  Donna [name] is a 78 y.o. female here today for follow up for OSA on CPAP.  She was last seen in consult with Dr Buck for history of OSA on CPAP with disrupted sleep and excessive daytime sleepiness. PSG 09/2022 showed overall mild obstructive sleep apnea, severe in REM sleep with a total AHI of 12.9/hour, REM AHI of 36.8/hour, supine AHI of 13.4/hour and O2 nadir of 86%. New autoPAP advised. She is adjusting fairly well. She is wearing a nasal pillow and feels it is aggravating her nose. She called DME and she is being sent a new mask but unsure of what type. She is not sure she has noted any benefit of using CPAP. She is up and down all night with colostomy bag. She is caring for her aunt who is ill. She helps her neighbor with care giver needs. She is motivated to continue using therapy.      HISTORY: (copied from Dr Obie previous note)  Dear Dr. Elmira,   I saw your patient, Donna [name], upon your kind request in my sleep clinic today for initial consultation of her sleep disorder, in particular, concern for underlying obstructive sleep apnea.  The patient is unaccompanied today.  As you know, Donna [name] is a 78 year old female with an underlying medical history of hypertension, hyperlipidemia, exertional dyspnea,  arthritis, with s/p L TKA, LBP, colon cancer, reflux disease, prediabetes, ulcerative colitis with s/p total colectomy, and mild obesity, who reports sleep disruption and excessive daytime somnolence.  Her Epworth sleepiness score is 11 out of 24, fatigue severity score is 43 out of 63.  I reviewed your office note from 04/26/2022.  Patient has sleep apnea and uses a CPAP machine.  She has a variable sleep schedule, generally goes to bed before midnight and rise time is around 8 but she is often awake in the middle of the night and has trouble maintaining sleep.  She has a colostomy bag and empties it once or twice per night and also urinates about twice or once per night on average.  She denies recurrent nocturnal or morning headaches.  She lives with her middle son, her oldest son has sleep apnea.  She has a total of 3 sons.  She works for [employer] for over 44 years.  She is divorced.  She is a non-smoker and does not drink any alcohol, she limits her caffeine to 1 cup of coffee per day on average.   REVIEW OF SYSTEMS: Out of a complete 14 system review of symptoms, the patient complains only of the following symptoms, fatigue and all other reviewed systems are negative.  ESS: 13/24, previously 11/24  ALLERGIES: Allergies  Allergen Reactions   Ozempic (0.25 Or 0.5 Mg-Dose) [Semaglutide(0.25 Or 0.5mg -Dos)] Nausea And Vomiting  Ezetimibe Other (See Comments)    Unknown   Gabapentin  Nausea And Vomiting    N/V and headaches   Iodinated Contrast Media Other (See Comments)    Feels faint, coughing, bp elevated   Mesalamine Other (See Comments)    Unknown   Oxycodone  Other (See Comments)    Hallucinations   Prilosec [Omeprazole ] Other (See Comments)    Made legs hurt, hair fell out, and nauseated.    Statins Other (See Comments)    Muscle spasms     HOME MEDICATIONS: Outpatient Medications Prior to Visit  Medication Sig Dispense Refill   acetaminophen  (TYLENOL ) 500 MG tablet Take 1,000 mg  by mouth every 6 (six) hours as needed for mild pain or headache.     amLODipine  (NORVASC ) 10 MG tablet Take 10 mg by mouth at bedtime.     amoxicillin -clavulanate (AUGMENTIN ) 875-125 MG tablet Take 1 tablet by mouth every 12 (twelve) hours. 60 tablet 6   aspirin  EC 81 MG tablet Take 1 tablet (81 mg total) by mouth daily. Swallow whole. 90 tablet 3   B Complex Vitamins (B COMPLEX 100 PO) Take 1 tablet by mouth daily.     benazepril  (LOTENSIN ) 40 MG tablet Take 40 mg by mouth at bedtime.     calcium  carbonate (OS-CAL - DOSED IN MG OF ELEMENTAL CALCIUM ) 1250 (500 Ca) MG tablet Take 2 tablets by mouth 2 (two) times daily with a meal.     Ensure Max Protein (ENSURE MAX PROTEIN) LIQD Take 330 mLs (11 oz total) by mouth 2 (two) times daily. 3330 mL 0   Evolocumab  (REPATHA  SURECLICK) 140 MG/ML SOAJ Inject 140 mg (1 pen) into the skin every 14 days 6 mL 3   famotidine  (PEPCID ) 20 MG tablet Take 20 mg by mouth daily.     labetalol  (NORMODYNE ) 100 MG tablet TAKE 1 TABLET TWICE DAILY (NEED TO KEEP UPCOMING APPOINTMENT IN APRIL TO PREVENT ANY MISSED DOSES) (Patient taking differently: Take 100 mg by mouth 2 (two) times daily.) 180 tablet 3   latanoprost  (XALATAN ) 0.005 % ophthalmic solution Place 1 drop into both eyes at bedtime.     Menthol -Methyl Salicylate (MUSCLE RUB) 10-15 % CREA Apply 1 application topically 2 (two) times daily as needed for muscle pain.     Multiple Vitamins-Iron  (MULTIVITAMINS WITH IRON ) TABS tablet Take 1 tablet by mouth daily.     pantoprazole  (PROTONIX ) 40 MG tablet Take 40 mg by mouth 2 (two) times daily before a meal.      psyllium (HYDROCIL/METAMUCIL) 95 % PACK Take 1 packet by mouth daily. 30 packet 1   timolol  (TIMOPTIC ) 0.5 % ophthalmic solution Place 1 drop into both eyes every morning.     traMADol  (ULTRAM ) 50 MG tablet Take 1 tablet (50 mg total) by mouth every 6 (six) hours as needed for moderate pain (pain score 4-6) or severe pain (pain score 7-10). 20 tablet 0    traZODone  (DESYREL ) 50 MG tablet Take 50 mg by mouth at bedtime.     Vitamins-Lipotropics (COMPLEX B-100-INOSITOL) TBCR Take 1 tablet by mouth daily.     cyclobenzaprine  (FLEXERIL ) 5 MG tablet Take 1 tablet (5 mg total) by mouth 3 (three) times daily as needed for muscle spasms. (Patient not taking: Reported on 12/12/2023) 20 tablet 2   loperamide  (IMODIUM ) 2 MG capsule Take 1 capsule (2 mg total) by mouth 2 (two) times daily. (Patient taking differently: Take 4 mg by mouth in the morning, at noon, and at bedtime.) 60 capsule  1   metoCLOPramide  (REGLAN ) 5 MG tablet Take 1 tablet (5 mg total) by mouth every 8 (eight) hours as needed for nausea or vomiting. 30 tablet 5   Multiple Vitamin (MULTIVITAMIN WITH MINERALS) TABS tablet Take 1 tablet by mouth daily. Woman's one a day (Patient not taking: Reported on 12/12/2023)     ondansetron  (ZOFRAN ) 4 MG tablet Take 1 tablet (4 mg total) by mouth every 6 (six) hours as needed for nausea. (Patient not taking: Reported on 12/12/2023) 20 tablet 0   polycarbophil (FIBERCON) 625 MG tablet Take 1 tablet (625 mg total) by mouth 2 (two) times daily. (Patient not taking: Reported on 12/12/2023) 60 tablet 0   sodium chloride  flush (NS) 0.9 % SOLN Use 10-40 mLs by intracatheter route every 12 (twelve) hours. (Patient not taking: Reported on 12/12/2023) 120 mL 0   No facility-administered medications prior to visit.    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   Degeneration of lumbar or lumbosacral intervertebral disc 01/21/2015   Essential hypertension 01/21/2015   GERD (gastroesophageal reflux disease) 01/21/2015   Heart murmur    ECHO 05-17-2023   History of excision of intestinal structure 10/18/2023   Hyperlipidemia 01/21/2015   Hypertension    Pre-diabetes    diet   S/P total colectomy 05/29/2019   Ulcerative colitis (HCC)      PAST SURGICAL HISTORY: Past Surgical History:  Procedure Laterality Date   BIOPSY  06/29/2022   Procedure: BIOPSY;  Surgeon: Teresa Lonni HERO, MD;  Location: [facility];  Service: General;;   CESAREAN SECTION     COLON SURGERY  2015   for twisted bowel   ERCP N/A 07/10/2023   Procedure: ERCP, WITH INTERVENTION IF INDICATED;  Surgeon: Rosalie Kitchens, MD;  Location: [facility];  Service: Gastroenterology;  Laterality: N/A;   EYE SURGERY Right    cataract extraction   FLEXIBLE SIGMOIDOSCOPY N/A 05/18/2020   Procedure: SURVEILLANCE FLEXIBLE SIGMOIDOSCOPY;  Surgeon: Teresa Lonni HERO, MD;  Location: [facility];  Service: General;  Laterality: N/A;   FLEXIBLE SIGMOIDOSCOPY N/A 06/29/2022   Procedure: FLEXIBLE SIGMOIDOSCOPY;  Surgeon: Teresa Lonni HERO, MD;  Location: [facility];  Service: General;  Laterality: N/A;   FLEXIBLE SIGMOIDOSCOPY Left 08/29/2023   Procedure: KINGSTON SIDE;  Surgeon: Teresa Lonni HERO, MD;  Location: [facility];  Service: General;  Laterality: Left;   INCISIONAL HERNIA REPAIR N/A 05/29/2019   Procedure: PRIMARY INCISIONAL HERNIA REPAIR;  Surgeon: Teresa Lonni HERO, MD;  Location: [facility];  Service: General;  Laterality: N/A;   IR US  GUIDE BX ASP/DRAIN  12/04/2023   LAPAROTOMY Right 01/12/2015   Procedure: OPEN RIGHT COLECTOMY;  Surgeon: Donnice Bury, MD;  Location: [facility];  Service: General;  Laterality: Right;   RIGHT COLECTOMY  01/12/2015   TOTAL KNEE ARTHROPLASTY Left 06/20/2018   Procedure: TOTAL KNEE ARTHROPLASTY;  Surgeon: Yvone Rush, MD;  Location: [facility];  Service: Orthopedics;  Laterality: Left;   XI ROBOTIC ASSISTED PARASTOMAL HERNIA REPAIR N/A 10/18/2023   Procedure: REPAIR, HERNIA, PARASTOMAL, ROBOT-ASSISTED;  Surgeon: Sheldon Standing, MD;  Location: [facility];  Service: General;  Laterality: N/A;  ROBOTIC PARASTOMAL & INCISIONAL HERNIA REPAIRS WITH MESH   XI ROBOTIC ASSISTED VENTRAL HERNIA N/A 10/18/2023   Procedure:  REPAIR, HERNIA, VENTRAL, ROBOT-ASSISTED;  Surgeon: Sheldon Standing, MD;  Location: [facility];  Service:  General;  Laterality: N/A;    FAMILY HISTORY: Family History  Problem Relation Age of Onset   Diabetes Mother    Ulcerative colitis Father    Heart failure Sister    Sleep apnea Son     SOCIAL HISTORY: Social History   Socioeconomic History   Marital status: Divorced    Spouse name: Not on file   Number of children: 3   Years of education: Not on file   Highest education level: Not on file  Occupational History   Not on file  Tobacco Use   Smoking status: Never    Passive exposure: Past   Smokeless tobacco: Never  Vaping Use   Vaping status: Never Used  Substance and Sexual Activity   Alcohol use: No   Drug use: No   Sexual activity: Not Currently  Other Topics Concern   Not on file  Social History Narrative   Not on file   Social Drivers of Health   Tobacco Use: Low Risk (12/12/2023)   Patient History    Smoking Tobacco Use: Never    Smokeless Tobacco Use: Never    Passive Exposure: Past  Financial Resource Strain: Not on file  Food Insecurity: No Food Insecurity (12/05/2023)   Epic    Worried About Programme Researcher, Broadcasting/film/video in the Last Year: Never true    Ran Out of Food in the Last Year: Never true  Recent Concern: Food Insecurity - Food Insecurity Present (12/04/2023)   Epic    Worried About Programme Researcher, Broadcasting/film/video in the Last Year: Sometimes true    Ran Out of Food in the Last Year: Sometimes true  Transportation Needs: No Transportation Needs (12/05/2023)   Epic    Lack of Transportation (Medical): No    Lack of Transportation (Non-Medical): No  Physical Activity: Not on file  Stress: Not on file  Social Connections: Moderately Integrated (12/04/2023)   Social Connection and Isolation Panel    Frequency of Communication with Friends and Family: More than three times a week    Frequency of Social Gatherings with Friends and Family: Once a week    Attends Religious  Services: More than 4 times per year    Active Member of [employer] or Organizations: Yes    Attends Banker Meetings: More than 4 times per year    Marital Status: Divorced  Intimate Partner Violence: Not At Risk (12/05/2023)   Epic    Fear of Current or Ex-Partner: No    Emotionally Abused: No    Physically Abused: No    Sexually Abused: No  Depression (PHQ2-9): Low Risk (11/03/2021)   Depression (PHQ2-9)    PHQ-2 Score: 0  Alcohol Screen: Not on file  Housing: Low Risk (12/05/2023)   Epic    Unable to Pay for Housing in the Last Year: No    Number of Times Moved in the Last Year: 0    Homeless in the Last Year: No  Utilities: Not At Risk (12/05/2023)   Epic    Threatened with loss of utilities: No  Health Literacy: Not on file     PHYSICAL EXAM  Vitals:   12/12/23 1315  BP: 130/68  Weight: 164 lb 8 oz (74.6 kg)  Height: 5' 3 (1.6 m)    Body mass index is 29.14 kg/m.  Generalized: Well developed, in no acute distress  Cardiology: normal rate and rhythm, no murmur noted Respiratory: clear to auscultation bilaterally  Neurological examination  Mentation: Alert oriented  to time, place, history taking. Follows all commands speech and language fluent Cranial nerve II-XII: Pupils were equal round reactive to light. Extraocular movements were full, visual field were full  Motor: The motor testing reveals 5 over 5 strength of all 4 extremities. Good symmetric motor tone is noted throughout.  Gait and station: Gait is normal.    DIAGNOSTIC DATA (LABS, IMAGING, TESTING) - I reviewed patient records, labs, notes, testing and imaging myself where available.      No data to display           Lab Results  Component Value Date   WBC 6.4 12/11/2023   HGB 10.5 (L) 12/11/2023   HCT 30.8 (L) 12/11/2023   MCV 87.0 12/11/2023   PLT 352 12/11/2023      Component Value Date/Time   NA 143 12/11/2023 0648   NA 144 04/30/2022 0820   K 3.9 12/11/2023 0648   CL 109  12/11/2023 0648   CO2 23 12/11/2023 0648   GLUCOSE 117 (H) 12/11/2023 0648   BUN 18 12/11/2023 0648   BUN 24 04/30/2022 0820   CREATININE 1.01 (H) 12/11/2023 0648   CALCIUM  9.4 12/11/2023 0648   PROT 6.8 12/03/2023 1646   ALBUMIN 3.4 (L) 12/11/2023 0648   AST 29 12/03/2023 1646   ALT 23 12/03/2023 1646   ALKPHOS 133 (H) 12/03/2023 1646   BILITOT 0.4 12/03/2023 1646   GFRNONAA 57 (L) 12/11/2023 0648   GFRAA >60 06/04/2019 0413   Lab Results  Component Value Date   CHOL 128 04/30/2022   HDL 65 04/30/2022   LDLCALC 42 04/30/2022   TRIG 117 04/30/2022   CHOLHDL 2.0 04/30/2022   Lab Results  Component Value Date   HGBA1C 5.0 05/21/2019   Lab Results  Component Value Date   VITAMINB12 2,421 (H) 12/05/2023   Lab Results  Component Value Date   TSH 2.950 04/30/2022     ASSESSMENT AND PLAN 78 y.o. year old female  has a past medical history of Anemia, Arthritis, Bulging lumbar disc, Cancer (HCC), [name] (HCC) (01/13/2015), Degeneration of lumbar or lumbosacral intervertebral disc (01/21/2015), Essential hypertension (01/21/2015), GERD (gastroesophageal reflux disease) (01/21/2015), Heart murmur, History of excision of intestinal structure (10/18/2023), Hyperlipidemia (01/21/2015), Hypertension, Pre-diabetes, S/P total colectomy (05/29/2019), and Ulcerative colitis (HCC). here with     ICD-10-CM   1. OSA on CPAP  G47.33     2. Dyspnea on exertion  R06.09     3. History of recent hospitalization  Z92.89       Donna [name] has not used CPAP, recently, but was previously doing well on CPAP therapy. No compliance data to review. She plans to resume this week. I will check download in 4-6 weeks. She was encouraged to continue using CPAP nightly and for greater than 4 hours each night. We will update supply orders as indicated. Risks of untreated sleep apnea review and education materials provided. We have discussed s/s of infection. She is aware to seek emergency  medical attention for and acute symptoms. Healthy lifestyle habits encouraged. She will follow up in 1 year, sooner if needed. She verbalizes understanding and agreement with this plan.    No orders of the defined types were placed in this encounter.    No orders of the defined types were placed in this encounter.   I personally spent a total of 30 minutes in the care of the patient today including preparing to see the patient, getting/reviewing separately obtained history, performing a  medically appropriate exam/evaluation, counseling and educating, placing orders, documenting clinical information in the EHR, independently interpreting results, and communicating results.   Greig Forbes, FNP-C 12/12/2023, 1:53 PM [facility] [address] [place], [place] [address] [phone]

## 2023-12-13 ENCOUNTER — Telehealth: Payer: Self-pay

## 2023-12-13 NOTE — Transitions of Care (Post Inpatient/ED Visit) (Signed)
° °  12/13/2023  Name: Donna [name] MRN: [id number] DOB: [birth year]  Today's TOC FU Call Status: Today's TOC FU Call Status:: Unsuccessful Call (2nd Attempt) Unsuccessful Call (2nd Attempt) Date: 12/13/23  Attempted to reach the patient regarding the most recent Inpatient/ED visit.  Follow Up Plan: Additional outreach attempts will be made to reach the patient to complete the Transitions of Care (Post Inpatient/ED visit) call.   Quinisha Mould J. Teka Chanda RN, MSN [facility], [facility] RN Care Manager Direct Dial: [phone]  Fax: [phone] Website: [email or web address]

## 2023-12-16 ENCOUNTER — Telehealth: Payer: Self-pay

## 2023-12-16 NOTE — Transitions of Care (Post Inpatient/ED Visit) (Signed)
° °  12/16/2023  Name: Donna [name] MRN: [id number] DOB: 17-Jun-1945  Today's TOC FU Call Status: Today's TOC FU Call Status:: Unsuccessful Call (3rd Attempt) Unsuccessful Call (3rd Attempt) Date: 12/16/23  Attempted to reach the patient regarding the most recent Inpatient/ED visit.  Follow Up Plan: No further outreach attempts will be made at this time. We have been unable to contact the patient.  Vonnie Ligman J. Avery Klingbeil RN, MSN [facility], [facility] RN Care Manager Direct Dial: [phone]  Fax: [phone] Website: [email or web address]

## 2023-12-19 ENCOUNTER — Telehealth: Payer: Self-pay

## 2023-12-19 NOTE — Telephone Encounter (Signed)
 Patient for CT Abd Pelvis w/contrast & Drain check on Friday 12/20/23, I called and spoke with the patient on the phone and gave pre-procedure instructions. Pt was made aware to be here at 9:45a and check in at the H&V entrance. Pt stated understanding.  Called 12/19/23

## 2023-12-20 ENCOUNTER — Other Ambulatory Visit: Payer: Self-pay

## 2023-12-20 ENCOUNTER — Ambulatory Visit
Admission: RE | Admit: 2023-12-20 | Discharge: 2023-12-20 | Disposition: A | Discharge status: Home / Self Care | Source: Ambulatory Visit | Attending: Radiology | Admitting: Radiology

## 2023-12-20 ENCOUNTER — Ambulatory Visit

## 2023-12-20 ENCOUNTER — Ambulatory Visit
Admission: RE | Admit: 2023-12-20 | Discharge: 2023-12-20 | Disposition: A | Discharge status: Home / Self Care | Source: Ambulatory Visit | Attending: Internal Medicine | Admitting: Internal Medicine

## 2023-12-20 DIAGNOSIS — R188 Other ascites: Secondary | ICD-10-CM

## 2023-12-20 DIAGNOSIS — K91873 Postprocedural seroma of a digestive system organ or structure following other procedure: Secondary | ICD-10-CM

## 2023-12-20 MED ORDER — PREDNISONE 50 MG PO TABS: ORAL_TABLET | ORAL | 0 refills | Status: AC

## 2023-12-20 MED ORDER — DIPHENHYDRAMINE HCL 50 MG PO TABS: ORAL_TABLET | ORAL | 0 refills | Status: AC

## 2023-12-20 NOTE — Progress Notes (Signed)
" °  IR BRIEF PROGRESS NOTE:  Patient was seen in IR for CT A/P w/ contrast, to be followed by drain injection in IR. Patient has a contrast allergy, and was not adequetly pre-medicated. Patient will be re-scheduled for both CT and drain injection to follow. Premedication prescriptions called in to patient's requested pharmacy: [facility] @ [address] (on file).   Patient will take the first prescribed Prednisone  50 mg pill by mouth, 13 hours prior to scheduled CT. She will take the second Prednisone  50 mg pill 7 hours prior to scheduled CT. Patient may have to wake up during the night to do this. Patient will bring with her to the CT appointment her 3rd and last Prednisone  50 mg pill, as well as the prescribed Benadryl  50 mg pill, to be taken 1 hour prior to CT scan /contrast injection.   Electronically Signed: Carlin DELENA Griffon, PA-C 12/20/2023, 1:27 PM      "

## 2023-12-24 ENCOUNTER — Other Ambulatory Visit (HOSPITAL_COMMUNITY): Payer: Self-pay

## 2023-12-30 ENCOUNTER — Other Ambulatory Visit: Payer: Self-pay

## 2023-12-31 ENCOUNTER — Telehealth: Payer: Self-pay

## 2023-12-31 NOTE — Telephone Encounter (Signed)
 Patient for CT abd pelvis w/ contrast & IR drain check on Friday 01/03/24, I called and spoke with the patient on the phone and gave pre-procedure instructions. Pt was made aware to be here at 11:30a and check in at the [facility] entrance. Pt stated understanding and is advised to take her Prednisone  prescription and Benadryl . Called 12/31/23  She will go to CT first and then to the IR Lab.

## 2024-01-01 ENCOUNTER — Other Ambulatory Visit: Payer: Self-pay

## 2024-01-01 ENCOUNTER — Other Ambulatory Visit (HOSPITAL_COMMUNITY): Payer: Self-pay

## 2024-01-01 NOTE — Progress Notes (Signed)
 Specialty Pharmacy Refill Coordination Note  Donna [name] is a 79 y.o. female contacted today regarding refills of specialty medication(s) Evolocumab  (Rep[employer]  SureClick)   Patient requested Delivery   Delivery date: 01/07/24   Verified address: [address]   [name] [address]   Medication will be filled on: 01/06/24   Patient stated her last injection did not work properly. Provided patient with phone number to Rep[employer]  support to inquire about a replacement.

## 2024-01-03 ENCOUNTER — Ambulatory Visit
Admission: RE | Admit: 2024-01-03 | Discharge: 2024-01-03 | Disposition: A | Discharge status: Home / Self Care | Source: Ambulatory Visit | Attending: Radiology | Admitting: Radiology

## 2024-01-03 ENCOUNTER — Ambulatory Visit
Admission: RE | Admit: 2024-01-03 | Discharge: 2024-01-03 | Disposition: A | Source: Ambulatory Visit | Attending: Internal Medicine

## 2024-01-03 ENCOUNTER — Other Ambulatory Visit (HOSPITAL_COMMUNITY): Payer: Self-pay | Admitting: Radiology

## 2024-01-03 DIAGNOSIS — K91873 Postprocedural seroma of a digestive system organ or structure following other procedure: Secondary | ICD-10-CM | POA: Diagnosis not present

## 2024-01-03 DIAGNOSIS — R188 Other ascites: Secondary | ICD-10-CM

## 2024-01-03 DIAGNOSIS — Z4803 Encounter for change or removal of drains: Secondary | ICD-10-CM | POA: Insufficient documentation

## 2024-01-03 DIAGNOSIS — B999 Unspecified infectious disease: Secondary | ICD-10-CM

## 2024-01-03 HISTORY — PX: IR CV LINE INJECTION: IMG2294

## 2024-01-03 MED ORDER — IOHEXOL 300 MG/ML  SOLN
10.0000 mL | Freq: Once | INTRAMUSCULAR | Status: AC | PRN
  Administered 2024-01-03: 10 mL

## 2024-01-03 MED ORDER — IOHEXOL 300 MG/ML  SOLN
100.0000 mL | Freq: Once | INTRAMUSCULAR | Status: AC | PRN
  Administered 2024-01-03: 100 mL via INTRAVENOUS

## 2024-01-06 ENCOUNTER — Other Ambulatory Visit: Payer: Self-pay

## 2024-01-06 ENCOUNTER — Ambulatory Visit: Payer: Self-pay | Admitting: Internal Medicine

## 2024-01-06 ENCOUNTER — Encounter: Payer: Self-pay | Admitting: Internal Medicine

## 2024-01-06 VITALS — BP 155/73 | HR 68 | Temp 97.8°F | Ht 63.0 in | Wt 158.0 lb

## 2024-01-06 DIAGNOSIS — B9620 Unspecified Escherichia coli [E. coli] as the cause of diseases classified elsewhere: Secondary | ICD-10-CM

## 2024-01-06 DIAGNOSIS — L02211 Cutaneous abscess of abdominal wall: Secondary | ICD-10-CM

## 2024-01-06 DIAGNOSIS — B954 Other streptococcus as the cause of diseases classified elsewhere: Secondary | ICD-10-CM

## 2024-01-06 DIAGNOSIS — B961 Klebsiella pneumoniae [K. pneumoniae] as the cause of diseases classified elsewhere: Secondary | ICD-10-CM | POA: Diagnosis not present

## 2024-01-06 DIAGNOSIS — L0291 Cutaneous abscess, unspecified: Secondary | ICD-10-CM

## 2024-01-06 NOTE — Patient Instructions (Signed)
 Follow up with general surgery for mesh plan Continue augmentin  F/U with Dr. Fleeta Rothman in February

## 2024-01-06 NOTE — Progress Notes (Signed)
 "     Patient: Donna [name]  DOB: 05-Feb-1945 MRN: [id number] PCP: Clarice Nottingham, MD    Chief Complaint  Patient presents with   Hospitalization Follow-up     Patient Active Problem List   Diagnosis Date Noted   Anorectal pain 12/12/2023   Chronic ulcerative enterocolitis (HCC) 12/12/2023   Infected hernioplasty mesh 12/10/2023   Polymicrobial bacterial infection 12/09/2023   Hx of colon cancer, stage I 12/09/2023   History of hernia repair 12/04/2023   CKD stage 3a, GFR 45-59 ml/min (HCC) 12/04/2023   Generalized weakness 12/04/2023   Anemia 12/04/2023   Intra-abdominal infection 12/03/2023   Abdominal pain, right lateral 11/25/2023   Acute renal failure superimposed on stage 3a chronic kidney disease (HCC) 11/10/2023   Abdominal wall fluid collection - probable hematoma 11/09/2023   AKI (acute kidney injury) 11/08/2023   Hypokalemia 10/28/2023   Pure hypercholesterolemia 10/18/2023   Prediabetes 10/18/2023   Obstructive sleep apnea syndrome 10/18/2023   Status post ileostomy (HCC) 10/18/2023   History of ulcerative colitis 10/18/2023   History of malignant neoplasm of colon 10/18/2023   Stage 3b chronic kidney disease (HCC) 10/18/2023   Sciatica 10/18/2023   Esophageal dysphagia 10/18/2023   Recurrent incisional hernia with incarceration 09/24/2023   Para-ileostomy hernia (HCC) 09/24/2023   Ulcerative colitis (HCC) 09/24/2023   Fatigue 04/26/2022   Dyspnea on exertion 03/20/2021   Elevated coronary artery calcium  score 03/19/2021   S/P total colectomy 05/29/2019   Primary osteoarthritis of left knee 06/20/2018   Hypertension 01/21/2015   Gastroesophageal reflux disease 01/21/2015   Degeneration of lumbar or lumbosacral intervertebral disc 01/21/2015   Hyperlipidemia 01/21/2015   Bilateral carpal tunnel syndrome 01/05/2015     Subjective:  Donna [name] is a 79 y.o. female with past medical history as below including ulcerative colitis, colon cancer  status post curative partial colectomy, ileostomy creation who had incarcerated abdominal wall hernia status post surgical repair in October 17/2025 involving mesh and and end ileostomy complicated with abscess around ileostomy/proximal to mesh.  Patient underwent IR guided drainage yielding E. coli, Klebsiella, strep intermedius Eikenella.  Discharged on Augmentin . Repeat CT abdomen pelvis on 01/03/2024 showed resolution of abscess.   presents today for follow-up.  Review of Systems  All other systems reviewed and are negative.   Past Medical History:  Diagnosis Date   Anemia    Arthritis    Bulging lumbar disc    Cancer (HCC)    colon   [name] (HCC) 01/13/2015   Degeneration of lumbar or lumbosacral intervertebral disc 01/21/2015   Essential hypertension 01/21/2015   GERD (gastroesophageal reflux disease) 01/21/2015   Heart murmur    ECHO 05-17-2023   History of excision of intestinal structure 10/18/2023   Hyperlipidemia 01/21/2015   Hypertension    Pre-diabetes    diet   S/P total colectomy 05/29/2019   Ulcerative colitis (HCC)     Outpatient Medications Prior to Visit  Medication Sig Dispense Refill   acetaminophen  (TYLENOL ) 500 MG tablet Take 1,000 mg by mouth every 6 (six) hours as needed for mild pain or headache.     amLODipine  (NORVASC ) 10 MG tablet Take 10 mg by mouth at bedtime.     amoxicillin -clavulanate (AUGMENTIN ) 875-125 MG tablet Take 1 tablet by mouth every 12 (twelve) hours. 60 tablet 6   aspirin  EC 81 MG tablet Take 1 tablet (81 mg total) by mouth daily. Swallow whole. 90 tablet 3   B Complex Vitamins (B COMPLEX 100 PO)  Take 1 tablet by mouth daily.     benazepril  (LOTENSIN ) 40 MG tablet Take 40 mg by mouth at bedtime.     calcium  carbonate (OS-CAL - DOSED IN MG OF ELEMENTAL CALCIUM ) 1250 (500 Ca) MG tablet Take 2 tablets by mouth 2 (two) times daily with a meal.     cyclobenzaprine  (FLEXERIL ) 5 MG tablet Take 1 tablet (5 mg total) by mouth 3 (three)  times daily as needed for muscle spasms. (Patient not taking: Reported on 01/06/2024) 20 tablet 2   diphenhydrAMINE  (BENADRYL ) 50 MG tablet Please bring this pill with you to your appointment for CT scan, and take it 1 hour prior to scheduled time. You will need a driver to drive you home after procedure. 1 tablet 0   Ensure Max Protein (ENSURE MAX PROTEIN) LIQD Take 330 mLs (11 oz total) by mouth 2 (two) times daily. 3330 mL 0   Evolocumab  (REPATHA  SURECLICK) 140 MG/ML SOAJ Inject 140 mg (1 pen) into the skin every 14 days 6 mL 3   famotidine  (PEPCID ) 20 MG tablet Take 20 mg by mouth daily.     labetalol  (NORMODYNE ) 100 MG tablet TAKE 1 TABLET TWICE DAILY (NEED TO KEEP UPCOMING APPOINTMENT IN APRIL TO PREVENT ANY MISSED DOSES) (Patient taking differently: Take 100 mg by mouth 2 (two) times daily.) 180 tablet 3   latanoprost  (XALATAN ) 0.005 % ophthalmic solution Place 1 drop into both eyes at bedtime.     loperamide  (IMODIUM ) 2 MG capsule Take 1 capsule (2 mg total) by mouth 2 (two) times daily. (Patient taking differently: Take 4 mg by mouth in the morning, at noon, and at bedtime.) 60 capsule 1   Menthol -Methyl Salicylate (MUSCLE RUB) 10-15 % CREA Apply 1 application topically 2 (two) times daily as needed for muscle pain.     metoCLOPramide  (REGLAN ) 5 MG tablet Take 1 tablet (5 mg total) by mouth every 8 (eight) hours as needed for nausea or vomiting. 30 tablet 5   Multiple Vitamin (MULTIVITAMIN WITH MINERALS) TABS tablet Take 1 tablet by mouth daily. Woman's one a day (Patient not taking: Reported on 12/12/2023)     Multiple Vitamins-Iron  (MULTIVITAMINS WITH IRON ) TABS tablet Take 1 tablet by mouth daily.     ondansetron  (ZOFRAN ) 4 MG tablet Take 1 tablet (4 mg total) by mouth every 6 (six) hours as needed for nausea. (Patient not taking: Reported on 12/12/2023) 20 tablet 0   pantoprazole  (PROTONIX ) 40 MG tablet Take 40 mg by mouth 2 (two) times daily before a meal.      predniSONE  (DELTASONE ) 50 MG  tablet Please take one pill 13 hours prior to your CT scan scheduled time. Please take a second pill 7 hours prior to your CT scan scheduled time. Please set alarms to wake you up to take these pills on a timely basis, if needed. Please bring the 3rd pill with you to your appointment, and take it 1 hour before CT scan schedule time. 3 tablet 0   psyllium (HYDROCIL/METAMUCIL) 95 % PACK Take 1 packet by mouth daily. 30 packet 1   sodium chloride  flush (NS) 0.9 % SOLN Use 10-40 mLs by intracatheter route every 12 (twelve) hours. (Patient not taking: Reported on 12/12/2023) 120 mL 0   timolol  (TIMOPTIC ) 0.5 % ophthalmic solution Place 1 drop into both eyes every morning.     traMADol  (ULTRAM ) 50 MG tablet Take 1 tablet (50 mg total) by mouth every 6 (six) hours as needed for moderate pain (pain score  4-6) or severe pain (pain score 7-10). 20 tablet 0   traZODone  (DESYREL ) 50 MG tablet Take 50 mg by mouth at bedtime.     Vitamins-Lipotropics (COMPLEX B-100-INOSITOL) TBCR Take 1 tablet by mouth daily.     No facility-administered medications prior to visit.     Allergies[1]  Social History[2]  Family History  Problem Relation Age of Onset   Diabetes Mother    Ulcerative colitis Father    Heart failure Sister    Sleep apnea Son     Objective:   Vitals:   01/06/24 1301  Weight: 158 lb (71.7 kg)  Height: 5' 3 (1.6 m)   Body mass index is 27.99 kg/m.  Physical Exam Constitutional:      Appearance: Normal appearance.  HENT:     Head: Normocephalic and atraumatic.     Right Ear: Tympanic membrane normal.     Left Ear: Tympanic membrane normal.     Nose: Nose normal.     Mouth/Throat:     Mouth: Mucous membranes are moist.  Eyes:     Extraocular Movements: Extraocular movements intact.     Conjunctiva/sclera: Conjunctivae normal.     Pupils: Pupils are equal, round, and reactive to light.  Cardiovascular:     Rate and Rhythm: Normal rate and regular rhythm.     Heart sounds: No  murmur heard.    No friction rub. No gallop.  Pulmonary:     Effort: Pulmonary effort is normal.     Breath sounds: Normal breath sounds.  Abdominal:     General: Abdomen is flat.     Palpations: Abdomen is soft.  Musculoskeletal:        General: Normal range of motion.  Skin:    General: Skin is warm and dry.  Neurological:     General: No focal deficit present.     Mental Status: She is alert and oriented to person, place, and time.  Psychiatric:        Mood and Affect: Mood normal.     Lab Results: Lab Results  Component Value Date   WBC 6.4 12/11/2023   HGB 10.5 (L) 12/11/2023   HCT 30.8 (L) 12/11/2023   MCV 87.0 12/11/2023   PLT 352 12/11/2023    Lab Results  Component Value Date   CREATININE 1.01 (H) 12/11/2023   BUN 18 12/11/2023   NA 143 12/11/2023   K 3.9 12/11/2023   CL 109 12/11/2023   CO2 23 12/11/2023    Lab Results  Component Value Date   ALT 23 12/03/2023   AST 29 12/03/2023   ALKPHOS 133 (H) 12/03/2023   BILITOT 0.4 12/03/2023     Assessment & Plan:  79 year old female past medical history of ulcerative colitis, colon cancer status post partial colectomy with ileostomy creation underwent hernia repair with mesh in October 2025 complicated by abscess #Intra-abdominal abscess requiring drain - Cultures grew E. coli, Klebsiella, Streptococcus intermedius and Eikenella.  Patient was discharged on Augmentin . -Repeat ct showed resolved abscess(01/03/24), drian out.  -Follow up with general surgery for mesh plan -Continue augmentin  given concern noted that infection proximal to mesh -Labs today -F/U with Dr. Fleeta Rothman in February Loney Stank, MD [facility]  I personally spent a total of 115 minutes in the care of the patient today including preparing to see the patient, getting/reviewing separately obtained history, performing a medically appropriate exam/evaluation, counseling and educating,  placing orders, documenting clinical information in  the EHR, independently interpreting results, and communicating results.    01/06/2024  1:02 PM     [1]  Allergies Allergen Reactions   Ozempic (0.25 Or 0.5 Mg-Dose) [Semaglutide(0.25 Or 0.5mg -Dos)] Nausea And Vomiting   Ezetimibe Other (See Comments)    Unknown   Gabapentin  Nausea And Vomiting    N/V and headaches   Iodinated Contrast Media Other (See Comments)    Feels faint, coughing, bp elevated   Mesalamine Other (See Comments)    Unknown   Oxycodone  Other (See Comments)    Hallucinations   Prilosec [Omeprazole ] Other (See Comments)    Made legs hurt, hair fell out, and nauseated.    Statins Other (See Comments)    Muscle spasms   [2]  Social History Tobacco Use   Smoking status: Never    Passive exposure: Past   Smokeless tobacco: Never  Vaping Use   Vaping status: Never Used  Substance Use Topics   Alcohol use: No   Drug use: No   "

## 2024-01-07 LAB — CBC WITH DIFFERENTIAL/PLATELET
Absolute Lymphocytes: 1357 {cells}/uL (ref 850–3900)
Absolute Monocytes: 578 {cells}/uL (ref 200–950)
Basophils Absolute: 48 {cells}/uL (ref 0–200)
Basophils Relative: 0.9 %
Eosinophils Absolute: 307 {cells}/uL (ref 15–500)
Eosinophils Relative: 5.8 %
HCT: 33.6 % — ABNORMAL LOW (ref 35.9–46.0)
Hemoglobin: 10.4 g/dL — ABNORMAL LOW (ref 11.7–15.5)
MCH: 28.7 pg (ref 27.0–33.0)
MCHC: 31 g/dL — ABNORMAL LOW (ref 31.6–35.4)
MCV: 92.6 fL (ref 81.4–101.7)
MPV: 10.1 fL (ref 7.5–12.5)
Monocytes Relative: 10.9 %
Neutro Abs: 3010 {cells}/uL (ref 1500–7800)
Neutrophils Relative %: 56.8 %
Platelets: 203 Thousand/uL (ref 140–400)
RBC: 3.63 Million/uL — ABNORMAL LOW (ref 3.80–5.10)
RDW: 16.6 % — ABNORMAL HIGH (ref 11.0–15.0)
Total Lymphocyte: 25.6 %
WBC: 5.3 Thousand/uL (ref 3.8–10.8)

## 2024-01-07 LAB — COMPLETE METABOLIC PANEL WITHOUT GFR
AG Ratio: 1.2 (calc) (ref 1.0–2.5)
ALT: 35 U/L — ABNORMAL HIGH (ref 6–29)
AST: 33 U/L (ref 10–35)
Albumin: 3.9 g/dL (ref 3.6–5.1)
Alkaline phosphatase (APISO): 103 U/L (ref 37–153)
BUN/Creatinine Ratio: 16 (calc) (ref 6–22)
BUN: 17 mg/dL (ref 7–25)
CO2: 26 mmol/L (ref 20–32)
Calcium: 9.5 mg/dL (ref 8.6–10.4)
Chloride: 108 mmol/L (ref 98–110)
Creat: 1.04 mg/dL — ABNORMAL HIGH (ref 0.60–1.00)
Globulin: 3.2 g/dL (ref 1.9–3.7)
Glucose, Bld: 92 mg/dL (ref 65–99)
Potassium: 4 mmol/L (ref 3.5–5.3)
Sodium: 141 mmol/L (ref 135–146)
Total Bilirubin: 0.5 mg/dL (ref 0.2–1.2)
Total Protein: 7.1 g/dL (ref 6.1–8.1)

## 2024-01-08 ENCOUNTER — Other Ambulatory Visit: Payer: Self-pay

## 2024-01-08 ENCOUNTER — Telehealth: Payer: Self-pay

## 2024-01-08 NOTE — Telephone Encounter (Signed)
 Patient left voicemail stating she contacted Dr. Rubie office and he had his nurse tell her that she did not need to follow up with them any longer.   Chairty Toman, BSN, RN

## 2024-01-09 ENCOUNTER — Other Ambulatory Visit: Payer: Self-pay

## 2024-01-13 NOTE — Telephone Encounter (Signed)
 Spoke with Maddie, RN at Dr. Rubie office. She states that as of 12/9, plan was to hold off on mesh removal for right now.   Patient will need follow up appointment with their office to further assess.   MyChart message sent to patient.   Aleksandar Duve, BSN, RN

## 2024-01-13 NOTE — Telephone Encounter (Signed)
 Could we contact his office and inquire about the mesh plan(not urgent). Per last surgery note plan to follow up in clinic.

## 2024-01-20 ENCOUNTER — Other Ambulatory Visit (HOSPITAL_COMMUNITY): Payer: Self-pay

## 2024-01-20 ENCOUNTER — Other Ambulatory Visit: Payer: Self-pay

## 2024-01-21 ENCOUNTER — Other Ambulatory Visit (HOSPITAL_COMMUNITY): Payer: Self-pay

## 2024-01-22 ENCOUNTER — Other Ambulatory Visit (HOSPITAL_COMMUNITY): Payer: Self-pay

## 2024-01-22 ENCOUNTER — Ambulatory Visit
Admission: RE | Admit: 2024-01-22 | Discharge: 2024-01-22 | Disposition: A | Source: Ambulatory Visit | Attending: Internal Medicine | Admitting: Internal Medicine

## 2024-01-22 DIAGNOSIS — Z1231 Encounter for screening mammogram for malignant neoplasm of breast: Secondary | ICD-10-CM

## 2024-01-24 ENCOUNTER — Other Ambulatory Visit: Payer: Self-pay

## 2024-01-28 ENCOUNTER — Other Ambulatory Visit: Payer: Self-pay | Admitting: Pharmacy Technician

## 2024-01-28 ENCOUNTER — Other Ambulatory Visit: Payer: Self-pay

## 2024-01-28 NOTE — Progress Notes (Signed)
 Specialty Pharmacy Refill Coordination Note  Donna [name] is a 79 y.o. female contacted today regarding refills of specialty medication(s) Evolocumab  (Repatha  SureClick)   Patient requested No data recorded  Delivery date: 02/06/24   Verified address: [id number]   [name] [id number]   Medication will be filled on: 02/05/24

## 2024-02-03 ENCOUNTER — Other Ambulatory Visit: Payer: Self-pay

## 2024-02-04 ENCOUNTER — Other Ambulatory Visit (HOSPITAL_COMMUNITY): Payer: Self-pay | Admitting: Nurse Practitioner

## 2024-02-04 ENCOUNTER — Ambulatory Visit (HOSPITAL_COMMUNITY)
Admission: RE | Admit: 2024-02-04 | Discharge: 2024-02-04 | Disposition: A | Source: Ambulatory Visit | Attending: *Deleted | Admitting: *Deleted

## 2024-02-04 DIAGNOSIS — Z432 Encounter for attention to ileostomy: Secondary | ICD-10-CM | POA: Insufficient documentation

## 2024-02-04 DIAGNOSIS — L24B3 Irritant contact dermatitis related to fecal or urinary stoma or fistula: Secondary | ICD-10-CM | POA: Insufficient documentation

## 2024-02-04 MED ORDER — FLUTICASONE PROPIONATE 50 MCG/ACT NA SUSP: 2.0000 | Freq: Every day | NASAL | 0 refills | Status: AC

## 2024-02-05 ENCOUNTER — Other Ambulatory Visit: Payer: Self-pay

## 2024-02-06 DIAGNOSIS — Z432 Encounter for attention to ileostomy: Secondary | ICD-10-CM | POA: Insufficient documentation

## 2024-02-06 DIAGNOSIS — L24B3 Irritant contact dermatitis related to fecal or urinary stoma or fistula: Secondary | ICD-10-CM | POA: Insufficient documentation

## 2024-02-06 NOTE — Discharge Instructions (Signed)
 Called in Flonase  Recommend a 1 piece convex light with ostomy belt for secure fit.  Let me know if you would like to try this.

## 2024-02-14 ENCOUNTER — Ambulatory Visit: Payer: Self-pay | Admitting: Infectious Disease

## 2024-12-28 ENCOUNTER — Ambulatory Visit: Admitting: Family Medicine
